# Patient Record
Sex: Male | Born: 1955 | Race: White | Hispanic: No | Marital: Married | Smoking: Former smoker
Health system: Southern US, Community
[De-identification: ages and names within clinical notes are randomized; demographics above are authoritative.]

## PROBLEM LIST (undated history)

## (undated) ENCOUNTER — Ambulatory Visit

## (undated) DIAGNOSIS — E119 Type 2 diabetes mellitus without complications: Secondary | ICD-10-CM

## (undated) DIAGNOSIS — J449 Chronic obstructive pulmonary disease, unspecified: Secondary | ICD-10-CM

## (undated) DIAGNOSIS — F419 Anxiety disorder, unspecified: Secondary | ICD-10-CM

## (undated) DIAGNOSIS — R7303 Prediabetes: Secondary | ICD-10-CM

## (undated) DIAGNOSIS — M109 Gout, unspecified: Secondary | ICD-10-CM

## (undated) DIAGNOSIS — Z9889 Other specified postprocedural states: Secondary | ICD-10-CM

## (undated) DIAGNOSIS — R112 Nausea with vomiting, unspecified: Secondary | ICD-10-CM

## (undated) DIAGNOSIS — F32A Depression, unspecified: Secondary | ICD-10-CM

## (undated) DIAGNOSIS — R51 Headache: Secondary | ICD-10-CM

## (undated) DIAGNOSIS — T4145XA Adverse effect of unspecified anesthetic, initial encounter: Secondary | ICD-10-CM

## (undated) DIAGNOSIS — K219 Gastro-esophageal reflux disease without esophagitis: Secondary | ICD-10-CM

## (undated) DIAGNOSIS — N400 Enlarged prostate without lower urinary tract symptoms: Secondary | ICD-10-CM

## (undated) DIAGNOSIS — M1990 Unspecified osteoarthritis, unspecified site: Secondary | ICD-10-CM

## (undated) DIAGNOSIS — G4730 Sleep apnea, unspecified: Secondary | ICD-10-CM

## (undated) DIAGNOSIS — R011 Cardiac murmur, unspecified: Secondary | ICD-10-CM

## (undated) DIAGNOSIS — F329 Major depressive disorder, single episode, unspecified: Secondary | ICD-10-CM

## (undated) DIAGNOSIS — Z972 Presence of dental prosthetic device (complete) (partial): Secondary | ICD-10-CM

## (undated) DIAGNOSIS — Z8719 Personal history of other diseases of the digestive system: Secondary | ICD-10-CM

## (undated) DIAGNOSIS — T8859XA Other complications of anesthesia, initial encounter: Secondary | ICD-10-CM

## (undated) DIAGNOSIS — K589 Irritable bowel syndrome without diarrhea: Secondary | ICD-10-CM

## (undated) DIAGNOSIS — J45909 Unspecified asthma, uncomplicated: Secondary | ICD-10-CM

## (undated) DIAGNOSIS — E785 Hyperlipidemia, unspecified: Secondary | ICD-10-CM

## (undated) HISTORY — PX: KNEE ARTHROSCOPY: SUR90

## (undated) HISTORY — PX: SHOULDER SURGERY: SHX246

## (undated) HISTORY — PX: APPENDECTOMY: SHX54

## (undated) HISTORY — PX: CHOLECYSTECTOMY: SHX55

## (undated) HISTORY — PX: NECK SURGERY: SHX720

## (undated) HISTORY — PX: SPINAL FUSION: SHX223

## (undated) HISTORY — DX: Hyperlipidemia, unspecified: E78.5

---

## 1996-09-01 HISTORY — PX: HERNIA REPAIR: SHX51

## 1999-08-27 ENCOUNTER — Ambulatory Visit (HOSPITAL_COMMUNITY): Admission: RE | Admit: 1999-08-27 | Discharge: 1999-08-27 | Payer: Self-pay | Admitting: Orthopedic Surgery

## 1999-08-27 ENCOUNTER — Encounter: Payer: Self-pay | Admitting: Orthopedic Surgery

## 1999-12-17 ENCOUNTER — Ambulatory Visit (HOSPITAL_COMMUNITY): Admission: RE | Admit: 1999-12-17 | Discharge: 1999-12-17 | Payer: Self-pay | Admitting: Neurosurgery

## 1999-12-17 ENCOUNTER — Encounter: Payer: Self-pay | Admitting: Neurosurgery

## 1999-12-23 ENCOUNTER — Encounter: Payer: Self-pay | Admitting: Neurosurgery

## 1999-12-24 ENCOUNTER — Encounter: Payer: Self-pay | Admitting: Neurosurgery

## 1999-12-24 ENCOUNTER — Inpatient Hospital Stay (HOSPITAL_COMMUNITY): Admission: RE | Admit: 1999-12-24 | Discharge: 1999-12-28 | Payer: Self-pay | Admitting: Neurosurgery

## 1999-12-27 ENCOUNTER — Encounter: Payer: Self-pay | Admitting: Neurosurgery

## 2000-01-06 ENCOUNTER — Encounter: Payer: Self-pay | Admitting: Neurosurgery

## 2000-01-06 ENCOUNTER — Encounter: Admission: RE | Admit: 2000-01-06 | Discharge: 2000-01-06 | Payer: Self-pay | Admitting: Neurosurgery

## 2000-02-10 ENCOUNTER — Encounter: Admission: RE | Admit: 2000-02-10 | Discharge: 2000-02-10 | Payer: Self-pay | Admitting: Neurosurgery

## 2000-02-10 ENCOUNTER — Encounter: Payer: Self-pay | Admitting: Neurosurgery

## 2000-03-26 ENCOUNTER — Encounter: Admission: RE | Admit: 2000-03-26 | Discharge: 2000-03-26 | Payer: Self-pay | Admitting: Neurosurgery

## 2000-03-26 ENCOUNTER — Encounter: Payer: Self-pay | Admitting: Neurosurgery

## 2000-06-08 ENCOUNTER — Encounter: Admission: RE | Admit: 2000-06-08 | Discharge: 2000-06-08 | Payer: Self-pay | Admitting: Neurosurgery

## 2000-06-08 ENCOUNTER — Encounter: Payer: Self-pay | Admitting: Neurosurgery

## 2000-09-01 HISTORY — PX: BACK SURGERY: SHX140

## 2000-09-10 ENCOUNTER — Encounter: Admission: RE | Admit: 2000-09-10 | Discharge: 2000-09-10 | Payer: Self-pay | Admitting: Neurosurgery

## 2000-09-10 ENCOUNTER — Encounter: Payer: Self-pay | Admitting: Neurosurgery

## 2000-11-10 ENCOUNTER — Encounter: Payer: Self-pay | Admitting: Neurosurgery

## 2000-11-10 ENCOUNTER — Ambulatory Visit (HOSPITAL_COMMUNITY): Admission: RE | Admit: 2000-11-10 | Discharge: 2000-11-10 | Payer: Self-pay | Admitting: Neurosurgery

## 2000-12-08 ENCOUNTER — Encounter (HOSPITAL_COMMUNITY): Admission: RE | Admit: 2000-12-08 | Discharge: 2001-01-07 | Payer: Self-pay | Admitting: Neurosurgery

## 2000-12-22 ENCOUNTER — Encounter: Payer: Self-pay | Admitting: Neurosurgery

## 2000-12-22 ENCOUNTER — Inpatient Hospital Stay (HOSPITAL_COMMUNITY): Admission: RE | Admit: 2000-12-22 | Discharge: 2000-12-23 | Payer: Self-pay | Admitting: Neurosurgery

## 2000-12-29 ENCOUNTER — Ambulatory Visit (HOSPITAL_COMMUNITY): Admission: RE | Admit: 2000-12-29 | Discharge: 2000-12-29 | Payer: Self-pay | Admitting: Neurosurgery

## 2000-12-29 ENCOUNTER — Encounter: Payer: Self-pay | Admitting: Neurosurgery

## 2001-01-06 ENCOUNTER — Encounter: Payer: Self-pay | Admitting: Neurosurgery

## 2001-01-06 ENCOUNTER — Ambulatory Visit (HOSPITAL_COMMUNITY): Admission: RE | Admit: 2001-01-06 | Discharge: 2001-01-08 | Payer: Self-pay | Admitting: Neurosurgery

## 2001-01-06 ENCOUNTER — Encounter: Admission: RE | Admit: 2001-01-06 | Discharge: 2001-01-06 | Payer: Self-pay | Admitting: Neurosurgery

## 2001-02-01 ENCOUNTER — Encounter (HOSPITAL_COMMUNITY): Admission: RE | Admit: 2001-02-01 | Discharge: 2001-03-03 | Payer: Self-pay | Admitting: Neurosurgery

## 2001-02-10 ENCOUNTER — Encounter (HOSPITAL_COMMUNITY): Admission: RE | Admit: 2001-02-10 | Discharge: 2001-03-12 | Payer: Self-pay | Admitting: Orthopedic Surgery

## 2001-02-25 ENCOUNTER — Encounter: Admission: RE | Admit: 2001-02-25 | Discharge: 2001-02-25 | Payer: Self-pay | Admitting: Neurosurgery

## 2001-02-25 ENCOUNTER — Encounter: Payer: Self-pay | Admitting: Neurosurgery

## 2001-06-04 ENCOUNTER — Encounter: Admission: RE | Admit: 2001-06-04 | Discharge: 2001-06-04 | Payer: Self-pay | Admitting: Neurosurgery

## 2001-06-04 ENCOUNTER — Encounter: Payer: Self-pay | Admitting: Neurosurgery

## 2001-06-07 ENCOUNTER — Ambulatory Visit (HOSPITAL_COMMUNITY): Admission: RE | Admit: 2001-06-07 | Discharge: 2001-06-07 | Payer: Self-pay | Admitting: Pulmonary Disease

## 2001-06-18 ENCOUNTER — Ambulatory Visit (HOSPITAL_COMMUNITY): Admission: RE | Admit: 2001-06-18 | Discharge: 2001-06-18 | Payer: Self-pay | Admitting: Pulmonary Disease

## 2001-07-01 ENCOUNTER — Ambulatory Visit (HOSPITAL_COMMUNITY): Admission: RE | Admit: 2001-07-01 | Discharge: 2001-07-01 | Payer: Self-pay | Admitting: Pulmonary Disease

## 2001-07-07 ENCOUNTER — Observation Stay (HOSPITAL_COMMUNITY): Admission: RE | Admit: 2001-07-07 | Discharge: 2001-07-08 | Payer: Self-pay | Admitting: General Surgery

## 2001-10-27 ENCOUNTER — Other Ambulatory Visit: Admission: RE | Admit: 2001-10-27 | Discharge: 2001-10-27 | Payer: Self-pay | Admitting: Otolaryngology

## 2001-11-15 ENCOUNTER — Ambulatory Visit (HOSPITAL_COMMUNITY): Admission: RE | Admit: 2001-11-15 | Discharge: 2001-11-15 | Payer: Self-pay | Admitting: Pulmonary Disease

## 2001-12-02 ENCOUNTER — Emergency Department (HOSPITAL_COMMUNITY): Admission: EM | Admit: 2001-12-02 | Discharge: 2001-12-02 | Payer: Self-pay | Admitting: *Deleted

## 2001-12-02 ENCOUNTER — Encounter: Payer: Self-pay | Admitting: Emergency Medicine

## 2001-12-25 ENCOUNTER — Emergency Department (HOSPITAL_COMMUNITY): Admission: EM | Admit: 2001-12-25 | Discharge: 2001-12-25 | Payer: Self-pay | Admitting: Internal Medicine

## 2001-12-25 ENCOUNTER — Encounter: Payer: Self-pay | Admitting: Internal Medicine

## 2002-01-06 ENCOUNTER — Ambulatory Visit (HOSPITAL_COMMUNITY): Admission: RE | Admit: 2002-01-06 | Discharge: 2002-01-06 | Payer: Self-pay | Admitting: Pulmonary Disease

## 2002-01-10 ENCOUNTER — Ambulatory Visit (HOSPITAL_COMMUNITY): Admission: RE | Admit: 2002-01-10 | Discharge: 2002-01-10 | Payer: Self-pay | Admitting: Pulmonary Disease

## 2002-08-22 ENCOUNTER — Emergency Department (HOSPITAL_COMMUNITY): Admission: EM | Admit: 2002-08-22 | Discharge: 2002-08-22 | Payer: Self-pay | Admitting: Emergency Medicine

## 2002-08-22 ENCOUNTER — Ambulatory Visit (HOSPITAL_COMMUNITY): Admission: RE | Admit: 2002-08-22 | Discharge: 2002-08-22 | Payer: Self-pay | Admitting: Pulmonary Disease

## 2002-09-09 ENCOUNTER — Encounter: Payer: Self-pay | Admitting: *Deleted

## 2002-09-09 ENCOUNTER — Emergency Department (HOSPITAL_COMMUNITY): Admission: EM | Admit: 2002-09-09 | Discharge: 2002-09-09 | Payer: Self-pay | Admitting: *Deleted

## 2002-09-29 ENCOUNTER — Ambulatory Visit (HOSPITAL_COMMUNITY): Admission: RE | Admit: 2002-09-29 | Discharge: 2002-09-29 | Payer: Self-pay | Admitting: Pulmonary Disease

## 2002-11-14 ENCOUNTER — Encounter (HOSPITAL_COMMUNITY): Admission: RE | Admit: 2002-11-14 | Discharge: 2002-12-14 | Payer: Self-pay | Admitting: Family Medicine

## 2003-01-10 ENCOUNTER — Ambulatory Visit (HOSPITAL_BASED_OUTPATIENT_CLINIC_OR_DEPARTMENT_OTHER): Admission: RE | Admit: 2003-01-10 | Discharge: 2003-01-10 | Payer: Self-pay | Admitting: Orthopedic Surgery

## 2003-01-10 ENCOUNTER — Encounter: Payer: Self-pay | Admitting: Orthopedic Surgery

## 2003-02-16 ENCOUNTER — Encounter (HOSPITAL_COMMUNITY): Admission: RE | Admit: 2003-02-16 | Discharge: 2003-03-18 | Payer: Self-pay | Admitting: Orthopedic Surgery

## 2003-02-24 ENCOUNTER — Observation Stay (HOSPITAL_COMMUNITY): Admission: RE | Admit: 2003-02-24 | Discharge: 2003-02-25 | Payer: Self-pay | Admitting: General Surgery

## 2003-09-02 HISTORY — PX: OTHER SURGICAL HISTORY: SHX169

## 2004-07-11 ENCOUNTER — Ambulatory Visit: Payer: Self-pay | Admitting: Urgent Care

## 2004-07-16 ENCOUNTER — Ambulatory Visit: Payer: Self-pay | Admitting: Internal Medicine

## 2004-07-18 ENCOUNTER — Ambulatory Visit (HOSPITAL_COMMUNITY): Admission: RE | Admit: 2004-07-18 | Discharge: 2004-07-18 | Payer: Self-pay | Admitting: Internal Medicine

## 2004-07-26 ENCOUNTER — Ambulatory Visit (HOSPITAL_COMMUNITY): Admission: RE | Admit: 2004-07-26 | Discharge: 2004-07-26 | Payer: Self-pay | Admitting: Internal Medicine

## 2004-08-29 ENCOUNTER — Ambulatory Visit: Payer: Self-pay | Admitting: Internal Medicine

## 2004-10-03 ENCOUNTER — Emergency Department (HOSPITAL_COMMUNITY): Admission: EM | Admit: 2004-10-03 | Discharge: 2004-10-03 | Payer: Self-pay | Admitting: Emergency Medicine

## 2004-11-24 ENCOUNTER — Encounter: Payer: Self-pay | Admitting: Orthopedic Surgery

## 2005-07-15 ENCOUNTER — Ambulatory Visit: Payer: Self-pay | Admitting: *Deleted

## 2005-07-15 ENCOUNTER — Ambulatory Visit (HOSPITAL_COMMUNITY): Admission: RE | Admit: 2005-07-15 | Discharge: 2005-07-15 | Payer: Self-pay | Admitting: Cardiology

## 2005-07-21 ENCOUNTER — Ambulatory Visit: Payer: Self-pay | Admitting: *Deleted

## 2005-07-21 ENCOUNTER — Inpatient Hospital Stay (HOSPITAL_BASED_OUTPATIENT_CLINIC_OR_DEPARTMENT_OTHER): Admission: RE | Admit: 2005-07-21 | Discharge: 2005-07-21 | Payer: Self-pay | Admitting: *Deleted

## 2005-08-04 ENCOUNTER — Ambulatory Visit: Payer: Self-pay | Admitting: Cardiology

## 2005-08-06 ENCOUNTER — Ambulatory Visit: Payer: Self-pay | Admitting: Cardiology

## 2005-08-06 ENCOUNTER — Ambulatory Visit (HOSPITAL_COMMUNITY): Admission: RE | Admit: 2005-08-06 | Discharge: 2005-08-06 | Payer: Self-pay | Admitting: *Deleted

## 2005-08-29 ENCOUNTER — Ambulatory Visit: Admission: RE | Admit: 2005-08-29 | Discharge: 2005-08-29 | Payer: Self-pay | Admitting: Pulmonary Disease

## 2005-09-05 ENCOUNTER — Ambulatory Visit: Payer: Self-pay | Admitting: Pulmonary Disease

## 2005-11-24 ENCOUNTER — Ambulatory Visit: Payer: Self-pay | Admitting: Orthopedic Surgery

## 2006-07-01 ENCOUNTER — Ambulatory Visit: Payer: Self-pay | Admitting: Orthopedic Surgery

## 2006-10-07 ENCOUNTER — Ambulatory Visit: Payer: Self-pay | Admitting: Orthopedic Surgery

## 2006-10-26 ENCOUNTER — Ambulatory Visit: Payer: Self-pay | Admitting: Orthopedic Surgery

## 2006-12-07 ENCOUNTER — Ambulatory Visit: Payer: Self-pay | Admitting: Orthopedic Surgery

## 2007-11-08 ENCOUNTER — Ambulatory Visit: Payer: Self-pay | Admitting: Orthopedic Surgery

## 2007-11-08 DIAGNOSIS — S93409A Sprain of unspecified ligament of unspecified ankle, initial encounter: Secondary | ICD-10-CM | POA: Insufficient documentation

## 2008-03-17 ENCOUNTER — Emergency Department (HOSPITAL_COMMUNITY): Admission: EM | Admit: 2008-03-17 | Discharge: 2008-03-17 | Payer: Self-pay | Admitting: Emergency Medicine

## 2008-03-18 ENCOUNTER — Inpatient Hospital Stay (HOSPITAL_COMMUNITY): Admission: AD | Admit: 2008-03-18 | Discharge: 2008-03-21 | Payer: Self-pay | Admitting: Psychiatry

## 2008-03-18 ENCOUNTER — Ambulatory Visit: Payer: Self-pay | Admitting: Psychiatry

## 2008-12-13 ENCOUNTER — Ambulatory Visit (HOSPITAL_COMMUNITY): Admission: RE | Admit: 2008-12-13 | Discharge: 2008-12-13 | Payer: Self-pay | Admitting: Pulmonary Disease

## 2009-01-30 ENCOUNTER — Ambulatory Visit: Payer: Self-pay | Admitting: Orthopedic Surgery

## 2009-01-30 DIAGNOSIS — M25569 Pain in unspecified knee: Secondary | ICD-10-CM | POA: Insufficient documentation

## 2009-01-30 DIAGNOSIS — M1710 Unilateral primary osteoarthritis, unspecified knee: Secondary | ICD-10-CM

## 2009-07-09 ENCOUNTER — Ambulatory Visit: Payer: Self-pay | Admitting: Orthopedic Surgery

## 2009-07-09 DIAGNOSIS — M23302 Other meniscus derangements, unspecified lateral meniscus, unspecified knee: Secondary | ICD-10-CM | POA: Insufficient documentation

## 2009-07-09 DIAGNOSIS — M25469 Effusion, unspecified knee: Secondary | ICD-10-CM

## 2009-07-23 ENCOUNTER — Ambulatory Visit: Payer: Self-pay | Admitting: Orthopedic Surgery

## 2009-07-31 ENCOUNTER — Encounter (INDEPENDENT_AMBULATORY_CARE_PROVIDER_SITE_OTHER): Payer: Self-pay | Admitting: *Deleted

## 2009-08-07 ENCOUNTER — Ambulatory Visit: Payer: Self-pay | Admitting: Orthopedic Surgery

## 2009-08-07 ENCOUNTER — Ambulatory Visit (HOSPITAL_COMMUNITY): Admission: RE | Admit: 2009-08-07 | Discharge: 2009-08-07 | Payer: Self-pay | Admitting: Orthopedic Surgery

## 2009-08-09 ENCOUNTER — Ambulatory Visit: Payer: Self-pay | Admitting: Orthopedic Surgery

## 2009-08-09 DIAGNOSIS — M6750 Plica syndrome, unspecified knee: Secondary | ICD-10-CM | POA: Insufficient documentation

## 2009-08-28 ENCOUNTER — Ambulatory Visit: Payer: Self-pay | Admitting: Orthopedic Surgery

## 2009-09-18 ENCOUNTER — Ambulatory Visit: Payer: Self-pay | Admitting: Orthopedic Surgery

## 2009-10-19 ENCOUNTER — Encounter: Payer: Self-pay | Admitting: Orthopedic Surgery

## 2010-01-15 ENCOUNTER — Ambulatory Visit: Payer: Self-pay | Admitting: Orthopedic Surgery

## 2010-01-15 DIAGNOSIS — M25559 Pain in unspecified hip: Secondary | ICD-10-CM

## 2010-01-15 DIAGNOSIS — M5126 Other intervertebral disc displacement, lumbar region: Secondary | ICD-10-CM

## 2010-01-16 ENCOUNTER — Telehealth: Payer: Self-pay | Admitting: Orthopedic Surgery

## 2010-01-17 ENCOUNTER — Telehealth: Payer: Self-pay | Admitting: Orthopedic Surgery

## 2010-01-18 ENCOUNTER — Encounter: Payer: Self-pay | Admitting: Orthopedic Surgery

## 2010-02-02 ENCOUNTER — Encounter: Admission: RE | Admit: 2010-02-02 | Discharge: 2010-02-02 | Payer: Self-pay | Admitting: Neurosurgery

## 2010-02-11 ENCOUNTER — Encounter: Payer: Self-pay | Admitting: Orthopedic Surgery

## 2010-02-19 ENCOUNTER — Inpatient Hospital Stay (HOSPITAL_COMMUNITY): Admission: RE | Admit: 2010-02-19 | Discharge: 2010-02-23 | Payer: Self-pay | Admitting: Neurosurgery

## 2010-03-14 ENCOUNTER — Encounter: Payer: Self-pay | Admitting: Orthopedic Surgery

## 2010-04-10 ENCOUNTER — Encounter: Payer: Self-pay | Admitting: Orthopedic Surgery

## 2010-09-24 ENCOUNTER — Encounter: Payer: Self-pay | Admitting: Orthopedic Surgery

## 2010-09-24 ENCOUNTER — Ambulatory Visit
Admission: RE | Admit: 2010-09-24 | Discharge: 2010-09-24 | Payer: Self-pay | Source: Home / Self Care | Attending: Orthopedic Surgery | Admitting: Orthopedic Surgery

## 2010-10-01 ENCOUNTER — Ambulatory Visit: Admit: 2010-10-01 | Payer: Self-pay | Admitting: Orthopedic Surgery

## 2010-10-01 NOTE — Assessment & Plan Note (Signed)
Summary: 3 mo reck xr left knee/sec hor/bsf   Visit Type:  Follow-up Primary Provider:  Dr. Juanetta Gosling  CC:  recheck left knee.  History of Present Illness: I saw Christopher Christopher in the office today for a followup visit.  He is a 56 years old man with the complaint of:  left knee fu surgery 7 arthroscopy, LEFT knee for partial medial meniscectomy, doing well,  Now comes in with new complaint of back pain and LEFT leg radicular pain.  Has back pain and pain radiating down the left leg with constant throbbing pain  in the left thigh and left calf area.  Knee doing well not bothering him   flexeril  sleeping pill     Allergies: 1)  ! Codeine 2)  ! Prednisone 3)  ! Percocet 4)  ! Doxycycline 5)  ! * Cogesic 6)  ! 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 Musculoskeletal:  Complains of muscle pain.  Physical Exam  Additional Exam:  left hip tender in the gluteal region;  relexes are the same 0-1+  no muscle atrophy   ROM of the hip is normal    Impression & Recommendations:  Problem # 1:  KNEE, ARTHRITIS, DEGEN./OSTEO (ICD-715.96) Assessment Improved  xrays of the pelvis show normal hip joints   L spine looks like L5 - S1 fusion ? L4-5 listhesis  fusion looks good      Problem # 2:  HIP PAIN (ICD-719.45) Assessment: Comment Only     Orders: Est. Patient Level III (82956)  Problem # 3:  DEGENERATIVE DISC DISEASE, LUMBOSACRAL SPINE  W/RADICULOPATHY (ICD-722.10) Assessment: New  neuro referral   Orders: Est. Patient Level III (21308)  Medications Added to Medication List This Visit: 1)  Neurontin 100 Mg Caps (Gabapentin) .Marland Kitchen.. 1-2 by mouth three times a day  Patient Instructions: 1)  Call Dr Jeral Fruit for referral s/p Lumbar fusion by him with recurrent left leg radiculopathy  2)  start neurontin 2 every 8 hours  Prescriptions: NEURONTIN 100 MG CAPS (GABAPENTIN) 1-2 by mouth three times a day  #60 x 2   Entered and Authorized by:   Fuller Canada MD   Signed by:   Fuller Canada MD on 01/15/2010   Method used:   Print then Give to Patient   RxID:   [id number]

## 2010-10-01 NOTE — Letter (Signed)
Summary: [facility] medical record request  [facility] medical record request   Imported By: Jacklynn Ganong 08/01/2010 11:39:52  _____________________________________________________________________  External Attachment:    Type:   Image     Comment:   External Document

## 2010-10-01 NOTE — Progress Notes (Signed)
Summary: Neurosurgeon referral.  Phone Note Outgoing Call   Call placed by: Waldon Reining,  Jan 16, 2010 3:13 PM Call placed to: Specialist Action Taken: Information Sent Summary of Call: I faxed a referral for this patient to [facility] to see Dr. Jeral Fruit fo rback and leg pain to be seen asap.

## 2010-10-01 NOTE — Miscellaneous (Signed)
Summary: patient came by our office  Clinical Lists Changes   Patient came by our office said needs to talk to you if you will call him at home, also needs refill on Tylox, thanks.

## 2010-10-01 NOTE — Letter (Signed)
Summary: [facility] office note Dr Anise Salvo office note Dr Jeral Fruit   Imported By: Cammie Sickle 04/10/2010 18:34:52  _____________________________________________________________________  External Attachment:    Type:   Image     Comment:   External Document

## 2010-10-01 NOTE — Letter (Signed)
Summary: [facility] office note Dr Anise Salvo office note Dr Jeral Fruit   Imported By: Cammie Sickle 02/23/2010 12:48:07  _____________________________________________________________________  External Attachment:    Type:   Image     Comment:   External Document

## 2010-10-01 NOTE — Assessment & Plan Note (Signed)
Summary: 3 WK RE-CK/POST OP LT KNEE SURG 08/07/09;SEC HORIZ/CAF   Primary Provider:  Dr. Juanetta Gosling   History of Present Illness: I saw Christopher [name] in the office today for a 3 week  followup visit.  He is a 55 years old man with the complaint of:  POST OP LEFT KNEE.  DOS 08/07/09 left knee  Procedure Arthroscopy left knee. Extensive synovectomy, and partial medial meniscectomy   Pain is lateral.  Medication  tylox/generic  Subjectives  recheck after home exercises.  Patient states his knee is sore, sometimes he has pressure at the joint.  Meds: FLexeril, Seroquel, Naproxen two times a day, Zoloft, Xanax, Protonix, Temazepam.  The knee looks great. Today. There's no swelling. He is stable. There is tenderness over the medial femoral condyle. McMurray sign is negative.  Assessment improved, but has degenerative disease.  Recommend bracing for activity, Tylenol arthritis and pain medication as needed  Allergies: 1)  ! Codeine 2)  ! Prednisone 3)  ! Percocet 4)  ! Doxycycline 5)  ! * Cogesic 6)  ! * Bee Stings   Impression & Recommendations:  Problem # 1:  DERANGEMENT MENISCUS (ICD-717.5) Assessment Comment Only  Orders: Post-Op Check ([address])  Problem # 2:  PLICA SYNDROME (ICD-727.83) Assessment: Improved  Orders: Post-Op Check (60454)  Patient Instructions: 1)  gradual return to normal activity  2)  wear the barce for activity  3)  take meds as needed  4)  return in 3 months xrays left knee

## 2010-10-01 NOTE — Consult Note (Signed)
Summary: [facility] consult Dr Anise Salvo consult Dr Jeral Fruit   Imported By: Cammie Sickle 02/04/2010 12:08:53  _____________________________________________________________________  External Attachment:    Type:   Image     Comment:   External Document

## 2010-10-01 NOTE — Progress Notes (Signed)
Summary: Appointment with Dr. Jeral Fruit.  Phone Note From Other Clinic   Caller: Referral Coordinator Reason for Call: Schedule Patient Appt Summary of Call: Patient has an appointment at [facility] with Dr. Jeral Fruit on 01-18-10. Initial call taken by: Waldon Reining,  Jan 17, 2010 3:09 PM

## 2010-10-03 NOTE — Letter (Signed)
Summary: Generic Letter  [facility]  [address] [place] [address]  [place], [place] [address]   Phone: [phone]  Fax: [phone]    09/24/2010  LOTTIE SISKA [address] [place], [place]  [address]  Dear Mr. WITHROW,  You have received an injection of cortisone today. You may experience increased pain at the injection site. Apply ice pack to the area for 20 minutes every 2 hours and take 2 xtra strength tylenol every 8 hours. This increased pain will usually resolve in 24 hours. The injection will take effect in 3-10 days.   Return 1 week for left knee evaluation    Sincerely,   Fuller Canada MD

## 2010-10-03 NOTE — Miscellaneous (Signed)
  Chief complaint RIGHT knee pain.  Status post arthroscopy, RIGHT knee several years ago complains of RIGHT knee pain over the anterior part of his knee around the patella area. Denies any injury reports swelling and fluid at the end of the day. He feels some tingling in his shin radiating from his knee. He says he feels pressure with flexing the knee.  No catching, locking, or giving way.  Exam shows no fluid on the knee at this time. He is tender around the inferior pole of patella medially and laterally. Patella tendon, nontender. Quadriceps tendon, nontender. No joint effusion is seen.  Negative McMurray sign.  Her ACL is intact.  He has full range of motion.  Neurovascular exam is normal.  Impression: he probably has some arthritis on seen on x-ray.  Recommend injection. Verbal consent was obtained. The RIGHT knee was prepped with alcohol and ethyl chloride. 1 cc of depomedrol 40mg /cc and 4 cc of lidocaine 1% was injected. there were no complications.   Separate identifiable. X-ray report 3 views RIGHT knee mild medial joint space narrowing, patellofemoral joint looks good.Impression mild medial joint space narrowing, consistent with osteoarthritis  I will see him in a week to check his LEFT knee

## 2010-10-03 NOTE — Assessment & Plan Note (Signed)
Summary: FLUID ON RT KNEE/[employer]/BSF   Allergies: 1)  ! Codeine 2)  ! Prednisone 3)  ! Percocet 4)  ! Doxycycline 5)  ! * Cogesic 6)  ! * Bee Stings

## 2010-10-09 NOTE — Miscellaneous (Signed)
  Clinical Lists Changes  Orders: Added new Service order of Est. Patient Level III (32440) - Signed Added new Service order of Knee x-ray,  3 views (10272) - Signed Added new Service order of Joint Aspirate / Injection, Large (20610) - Signed Added new Service order of Depo- Medrol 40mg  (J1030) - Signed

## 2010-10-15 ENCOUNTER — Ambulatory Visit (INDEPENDENT_AMBULATORY_CARE_PROVIDER_SITE_OTHER): Payer: MEDICARE | Admitting: Orthopedic Surgery

## 2010-10-15 ENCOUNTER — Encounter: Payer: Self-pay | Admitting: Orthopedic Surgery

## 2010-10-15 DIAGNOSIS — IMO0002 Reserved for concepts with insufficient information to code with codable children: Secondary | ICD-10-CM

## 2010-10-15 DIAGNOSIS — M1710 Unilateral primary osteoarthritis, unspecified knee: Secondary | ICD-10-CM | POA: Insufficient documentation

## 2010-10-18 ENCOUNTER — Telehealth: Payer: Self-pay | Admitting: Orthopedic Surgery

## 2010-10-18 ENCOUNTER — Other Ambulatory Visit: Payer: Self-pay | Admitting: Orthopedic Surgery

## 2010-10-18 DIAGNOSIS — S83209A Unspecified tear of unspecified meniscus, current injury, unspecified knee, initial encounter: Secondary | ICD-10-CM

## 2010-10-21 ENCOUNTER — Ambulatory Visit (HOSPITAL_COMMUNITY)
Admission: RE | Admit: 2010-10-21 | Discharge: 2010-10-21 | Disposition: A | Discharge status: Home / Self Care | Payer: MEDICARE | Source: Ambulatory Visit | Attending: Orthopedic Surgery | Admitting: Orthopedic Surgery

## 2010-10-21 DIAGNOSIS — M25469 Effusion, unspecified knee: Secondary | ICD-10-CM | POA: Insufficient documentation

## 2010-10-21 DIAGNOSIS — M25569 Pain in unspecified knee: Secondary | ICD-10-CM | POA: Insufficient documentation

## 2010-10-21 DIAGNOSIS — S83209A Unspecified tear of unspecified meniscus, current injury, unspecified knee, initial encounter: Secondary | ICD-10-CM

## 2010-10-23 NOTE — Assessment & Plan Note (Signed)
Summary: Eval LT knee (had RT knee eval 09/24/10)UHC/caf / pt r/s from ...   Visit Type:  Follow-up Primary Provider:  Dr. Juanetta Gosling  CC:  left knee pain.  History of Present Illness: I saw Christopher [name] in the office today for a followup visit.  He is a 55 years old man with the complaint of:  bilateral knees.  DOS   08-07-09.  Procedure   Arthroscopy of left knee.  Last seen on 09-24-10 and received an injection, did not help.  Complaints: Right knee is not any better. Left knee has soreness.  medial knee pain and tenderness, MCMurrays +   Allergies: 1)  ! Codeine 2)  ! Prednisone 3)  ! Percocet 4)  ! Doxycycline 5)  ! * Cogesic 6)  ! * Bee Stings  Past History:  Past Medical History: Last updated: 01/30/2009 medical history includes hypertension  Past Surgical History: Last updated: 01/30/2009 previous surgery on his lumbar spine and his RIGHT knee, arthroscopy  Review of Systems Neurologic:  right leg loss of sensation .   Knee Exam  General:    Well-developed, well-nourished, normal body habitus; no deformities, normal grooming.  Gait:    Normal heel-toe gait pattern bilaterally.    Skin:    Intact, no scars, lesions, rashes, cafe au lait spots, or bruising.    Palpation:    tenderness R-medial joint line and tenderness L-medial joint line.    Vascular:    There was no swelling or varicose veins. The pulses and temperature are normal. There was no edema or tenderness.  Sensory:    Gross coordination and sensation were normal.    Motor:    Motor strength 5/5 bilaterally for quadriceps, hamstrings, ankle dorsiflexion, and ankle plantar flexion.    Knee Exam:    Right:    Stability:  stable    Inspection/Palpation:  medial knee pain and tenderness, MCMurrays +     Range of Motion:       Flexion-Active: 125 degrees    Left:    Inspection:  Abnormal    Palpation:  Abnormal    Stability:  stable    Range of Motion:       Flexion-Active: 125  degrees   Impression & Recommendations:  Problem # 1:  ARTHRITIS, LEFT KNEE (ZOX-096.04) Assessment Deteriorated  Verbal consent was obtained. The left knee was prepped with alcohol and ethyl chloride. 1 cc of depomedrol 40mg /cc and 4 cc of lidocaine 1% was injected. there were no complications.  Orders: Est. Patient Level III (54098) Joint Aspirate / Injection, Large (20610) Depo- Medrol 40mg  (J1030)  Problem # 2:  MEDIAL MENISCUS TEAR, RIGHT (ICD-836.0) Assessment: New  MRI right knee    previous xrays show medial joint spcae narrowing   Orders: Est. Patient Level III (11914)  Patient Instructions: 1)  MRI right knee  2)  return for results    Orders Added: 1)  Est. Patient Level III [78295] 2)  Joint Aspirate / Injection, Large [20610] 3)  Depo- Medrol 40mg  [J1030]

## 2010-10-23 NOTE — Progress Notes (Signed)
Summary: MRI appointment.  Phone Note Outgoing Call   Call placed by: Waldon Reining,  October 18, 2010 10:28 AM Call placed to: Patient Action Taken: Appt scheduled Summary of Call: I called to give the patient his MRI appointment at [facility] on 10-21-10 at 12:30. Patient has [employer], authorization [phone]. Patient will follow up here for results.

## 2010-11-15 ENCOUNTER — Encounter: Payer: Self-pay | Admitting: Orthopedic Surgery

## 2010-11-17 LAB — CBC
HCT: 43 % (ref 39.0–52.0)
Hemoglobin: 14.8 g/dL (ref 13.0–17.0)
MCHC: 34.4 g/dL (ref 30.0–36.0)
MCV: 87.4 fL (ref 78.0–100.0)
Platelets: 211 10*3/uL (ref 150–400)
RBC: 4.92 MIL/uL (ref 4.22–5.81)
RDW: 14 % (ref 11.5–15.5)
WBC: 8.5 10*3/uL (ref 4.0–10.5)

## 2010-11-17 LAB — BASIC METABOLIC PANEL
BUN: 15 mg/dL (ref 6–23)
CO2: 29 mEq/L (ref 19–32)
Calcium: 9.7 mg/dL (ref 8.4–10.5)
Chloride: 103 mEq/L (ref 96–112)
Creatinine, Ser: 0.98 mg/dL (ref 0.4–1.5)
GFR calc Af Amer: >60 mL/min (ref >60)
GFR calc non Af Amer: >60 mL/min (ref >60)
Glucose, Bld: 96 mg/dL (ref 70–99)
Potassium: 4.9 mEq/L (ref 3.5–5.1)
Sodium: 139 mEq/L (ref 135–145)

## 2010-11-17 LAB — SURGICAL PCR SCREEN
MRSA, PCR: POSITIVE — AB
Staphylococcus aureus: POSITIVE — AB

## 2010-11-17 LAB — TYPE AND SCREEN
ABO/RH(D): B POS
Antibody Screen: NEGATIVE

## 2010-11-17 LAB — ABO/RH: ABO/RH(D): B POS

## 2010-11-21 ENCOUNTER — Telehealth: Payer: Self-pay | Admitting: Orthopedic Surgery

## 2010-11-21 NOTE — Telephone Encounter (Signed)
i dont have the report

## 2010-11-21 NOTE — Telephone Encounter (Signed)
Patient requests review and call re: MRI RT knee, ph [phone].  Last off note "fol/up here for results" - patient wishes to (if possible) receive results by phone due to high insurance copay for office visit.

## 2010-11-21 NOTE — Telephone Encounter (Signed)
Patient and spouse brought MRI film RT knee from DOS 10/21/10.  Request call back w/results Vs. Fol/up appointment in office as last office note indicates, if possible, due to cost of office visit copay.  Film on cart.  Please advise. Patient PH # [phone]

## 2010-11-25 NOTE — Telephone Encounter (Signed)
Please review

## 2010-11-25 NOTE — Telephone Encounter (Signed)
The MRI report had been with film. Report is back on your desk for review and for call back to patient per previous note. Patient ph# is [phone].

## 2010-12-03 LAB — BASIC METABOLIC PANEL
BUN: 21 mg/dL (ref 6–23)
CO2: 26 mEq/L (ref 19–32)
Calcium: 9.3 mg/dL (ref 8.4–10.5)
Chloride: 104 mEq/L (ref 96–112)
Creatinine, Ser: 0.93 mg/dL (ref 0.4–1.5)
GFR calc Af Amer: >60 mL/min (ref >60)
GFR calc non Af Amer: >60 mL/min (ref >60)
Glucose, Bld: 111 mg/dL — ABNORMAL HIGH (ref 70–99)
Potassium: 4.1 mEq/L (ref 3.5–5.1)
Sodium: 137 mEq/L (ref 135–145)

## 2010-12-03 LAB — HEMOGLOBIN AND HEMATOCRIT, BLOOD
HCT: 40.7 % (ref 39.0–52.0)
Hemoglobin: 14.1 g/dL (ref 13.0–17.0)

## 2010-12-11 LAB — BLOOD GAS, ARTERIAL
Acid-Base Excess: 0 mmol/L (ref 0.0–2.0)
Bicarbonate: 24.2 mEq/L — ABNORMAL HIGH (ref 20.0–24.0)
FIO2: 0.21 %
O2 Saturation: 94.5 %
Patient temperature: 37
TCO2: 21.4 mmol/L (ref 0–100)
pCO2 arterial: 39.5 mmHg (ref 35.0–45.0)
pH, Arterial: 7.404 (ref 7.350–7.450)
pO2, Arterial: 71.4 mmHg — ABNORMAL LOW (ref 80.0–100.0)

## 2010-12-24 ENCOUNTER — Encounter: Payer: Self-pay | Admitting: Orthopedic Surgery

## 2011-01-09 ENCOUNTER — Encounter: Payer: Self-pay | Admitting: Orthopedic Surgery

## 2011-01-09 ENCOUNTER — Ambulatory Visit (INDEPENDENT_AMBULATORY_CARE_PROVIDER_SITE_OTHER): Payer: Medicare Other | Admitting: Orthopedic Surgery

## 2011-01-09 ENCOUNTER — Ambulatory Visit: Payer: MEDICARE | Admitting: Orthopedic Surgery

## 2011-01-09 DIAGNOSIS — M1710 Unilateral primary osteoarthritis, unspecified knee: Secondary | ICD-10-CM

## 2011-01-09 DIAGNOSIS — M23302 Other meniscus derangements, unspecified lateral meniscus, unspecified knee: Secondary | ICD-10-CM

## 2011-01-09 DIAGNOSIS — IMO0002 Reserved for concepts with insufficient information to code with codable children: Secondary | ICD-10-CM

## 2011-01-09 NOTE — Progress Notes (Signed)
[place] comes in complaining of RIGHT knee pain over the medial aspects and anterior aspects of the knee exacerbated by stair climbing, kneeling, and squatting is at a previous RIGHT knee arthroscopy. He requested injection.  I took an x-ray not too long ago and it really does not show a lot of joint space narrowing.  We discussed options and decided to go with arthroscopic surgery to assess him for re\re tear of his meniscus versus chondromalacia patella versus accelerated arthritis of the medial compartment.  Please see history and physical for details.

## 2011-01-10 ENCOUNTER — Encounter (HOSPITAL_COMMUNITY): Payer: Medicare Other

## 2011-01-10 ENCOUNTER — Other Ambulatory Visit: Payer: Self-pay | Admitting: Orthopedic Surgery

## 2011-01-10 ENCOUNTER — Telehealth: Payer: Self-pay | Admitting: Orthopedic Surgery

## 2011-01-10 DIAGNOSIS — M23329 Other meniscus derangements, posterior horn of medial meniscus, unspecified knee: Secondary | ICD-10-CM

## 2011-01-10 DIAGNOSIS — M23302 Other meniscus derangements, unspecified lateral meniscus, unspecified knee: Secondary | ICD-10-CM

## 2011-01-10 LAB — BASIC METABOLIC PANEL
BUN: 16 mg/dL (ref 6–23)
CO2: 29 mEq/L (ref 19–32)
Chloride: 103 mEq/L (ref 96–112)
GFR calc non Af Amer: >60 mL/min (ref >60)
Glucose, Bld: 118 mg/dL — ABNORMAL HIGH (ref 70–99)
Potassium: 4 mEq/L (ref 3.5–5.1)
Sodium: 139 mEq/L (ref 135–145)

## 2011-01-10 LAB — SURGICAL PCR SCREEN

## 2011-01-10 LAB — HEMOGLOBIN AND HEMATOCRIT, BLOOD
HCT: 42.9 % (ref 39.0–52.0)
Hemoglobin: 14.5 g/dL (ref 13.0–17.0)

## 2011-01-10 NOTE — Telephone Encounter (Signed)
Contacted insurer [employer]) re: out-patient surgery scheduled 01/13/11 at [facility] [place] [place] [id number]. Spoke w/Phyllis L., intake coordinator at ph# [phone]; no pre-authorization required for out-patient procedure for in-network provider under patient's plan.

## 2011-01-13 ENCOUNTER — Ambulatory Visit (HOSPITAL_COMMUNITY)
Admission: RE | Admit: 2011-01-13 | Discharge: 2011-01-13 | Disposition: A | Discharge status: Home / Self Care | Payer: Medicare Other | Source: Ambulatory Visit | Attending: Orthopedic Surgery | Admitting: Orthopedic Surgery

## 2011-01-13 DIAGNOSIS — G4733 Obstructive sleep apnea (adult) (pediatric): Secondary | ICD-10-CM | POA: Insufficient documentation

## 2011-01-13 DIAGNOSIS — I10 Essential (primary) hypertension: Secondary | ICD-10-CM | POA: Insufficient documentation

## 2011-01-13 DIAGNOSIS — M23305 Other meniscus derangements, unspecified medial meniscus, unspecified knee: Secondary | ICD-10-CM | POA: Insufficient documentation
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
## 2011-01-14 NOTE — Procedures (Signed)
NAME:  Christopher [name], Christopher [name]              ACCOUNT NO.:  [id number]   MEDICAL RECORD NO.:  [id number]         PATIENT TYPE:  POUT   LOCATION:  RESP                          FACILITY:  [facility]   PHYSICIAN:  Edward L. Juanetta Gosling, M.D.DATE OF BIRTH:  [birth year]   DATE OF PROCEDURE:  12/13/2008  DATE OF DISCHARGE:                            PULMONARY FUNCTION TEST   1. Spirometry is normal.  2. Lung volumes are normal.  3. DLCO is mildly reduced.  4. Arterial blood gas is normal.      Edward L. Juanetta Gosling, M.D.  Electronically Signed     ELH/MEDQ  D:  12/14/2008  T:  12/15/2008  Job:  [id number]

## 2011-01-15 ENCOUNTER — Ambulatory Visit (INDEPENDENT_AMBULATORY_CARE_PROVIDER_SITE_OTHER): Payer: Medicare Other | Admitting: Orthopedic Surgery

## 2011-01-15 DIAGNOSIS — M25469 Effusion, unspecified knee: Secondary | ICD-10-CM

## 2011-01-15 DIAGNOSIS — Z9889 Other specified postprocedural states: Secondary | ICD-10-CM

## 2011-01-15 MED ORDER — DIPHENHYDRAMINE HCL 25 MG PO CAPS: 25.0000 mg | ORAL_CAPSULE | ORAL | Status: DC | PRN

## 2011-01-15 MED ORDER — HYDROCODONE-ACETAMINOPHEN 10-325 MG PO TABS: 1.0000 | ORAL_TABLET | ORAL | Status: DC | PRN

## 2011-01-15 NOTE — Progress Notes (Signed)
.  date   Date of surgery the 14th  Procedure arthroscopy partial medial meniscectomy  Postop day 2  Complaints of severe RIGHT knee pain severe RIGHT knee swelling pain unrelieved by dialogue.  Patient thought to be ALLERGIC to codeine and hydrocodone.  Exam reveals a tense swollen knee joint suture line intact over the portals tenderness over the joint.  Aspirated red-colored synovial fluid from obvious bleeding into the joint total aspiration approximately 50 cc  Patient fell a little bit better.  Ace Wrap applied.  Patient advised to rest use ice return Monday for possible repeat aspiration  No signs of infection are noted  Sutures were removed from the portals.  Patient was placed on hydrocodone with Benadryl.

## 2011-01-15 NOTE — Progress Notes (Signed)
Aspiration RIGHT knee  Verbal consent  Time out  Lateral approach  Alcohol prep.  Ethyl chloride anesthesia  Needle was introduced through the lateral suprapatellar approach.  We aspirated 50 cc of red tinged fluid  No complications

## 2011-01-16 ENCOUNTER — Ambulatory Visit: Payer: Medicare Other | Admitting: Orthopedic Surgery

## 2011-01-17 NOTE — H&P (Signed)
[place]. [facility]  Patient:    Christopher [name], Christopher [name]                     MRN: [id number] Adm. Date:  [id number] Attending:  Danella Penton                         History and Physical  HISTORY OF PRESENT ILLNESS:  Christopher [name] is a gentleman who was seen by me initially at the beginning of the year because of back pain for almost four months.  The patient was complaining of back pain localized to the lumbar area with some radiation down to the legs but not below the knee.  The patient has been unable to walk.  The patient had been seen by orthopaedic surgeon who proceed with the lumbar myelogram.  According to the patient, he was told that he needed some type of fusion.  Nevertheless by the time I saw him he was was having quite a bit of pain. He had difficulty standing.  Because of the findings, both clinically and radiologically he is being admitted today for surgery.  FAMILY HISTORY: Mother is 56 with emphysema.  PAST MEDICAL HISTORY:  He has had bilateral knee surgery, right shoulder surgery and appendectomy.  ALLERGIES:  The patient is allergic to Prednisone, morphine, Celebrex, Vioxx and some antibiotic.  By the time I saw the patient he was taking Demerol and Robaxin.  SOCIAL HISTORY:  He is a heavy smoker and he was going to stop smoking which indeed he did.  REVIEW OF SYSTEMS:  He complains of chest pain, indigestion and back pain.  PHYSICAL EXAMINATION:  GENERAL:  The patient came to my office twice with his wife. He has quite a bit of difficulty sitting and standing. He kept his back flexed to prevent any pain.  HEENT:  Normal.  NECK:  He has some discomfort with lateralization.  LUNGS:  Normal.  HEART: Sounds normal.  ABDOMEN:  Normal.  EXTREMITIES:  There is a scar in the right shoulder from previous surgery.  NEUROLOGIC: Mental status normal.  Cranial nerves normal. Reflexes 2+. No Babinski. Straight leg raising is  positive bilaterally at 30 degrees.  Finger clench maneuver is negative bilaterally.  The patient had difficulty walking on tip toes and heels.  Squatting being difficult for him.  Sensation seemed to be normal.  The MRI showed that indeed at the level of L1 and 2 there is mild degenerative disc disease which with the repeat MRI has improved.  The majority of findings were at L5-S1 where he has a bad case of degenerative disc disease with bilateral foraminal stenosis.  There is almost no space at the level of the disc space.  CLINICAL IMPRESSION: 1. Degenerative disc disease at the level of L5-S1 producing bilateral S1 foraminal stenosis. 2. Mild L1-L2 degenerative disc disease.  RECOMMENDATIONS:  The patient is being admitted for surgery.  The procedure will be a bilateral L5-S1 diskectomy interbody fusion using bone and pedicle screws.  Also we are going to do a posterior lateral fusion.  We are going to be using Allograft.  The patient knows about the risks of the surgery such as no improvement whatsoever, infection, CSF leak, worsening of the pain, need for further surgery and all the risks associated with his general anesthesia. DD:  12/24/99 TD:  12/24/99 Job: [id number]

## 2011-01-17 NOTE — Cardiovascular Report (Signed)
NAME:  Christopher [name], Christopher [name] NO.:  [id number]   MEDICAL RECORD NO.:  [id number]          PATIENT TYPE:  OIB   LOCATION:  1965                         FACILITY:  [facility]   PHYSICIAN:  Vida Roller, M.D.   DATE OF BIRTH:  08-13-1956   DATE OF PROCEDURE:  07/21/2005  DATE OF DISCHARGE:                              CARDIAC CATHETERIZATION   PRIMARY CARE PHYSICIAN:  Oneal Deputy. Juanetta Gosling, M.D.   HISTORY OF PRESENT ILLNESS:  Christopher [name] is  a 55 year old man with multiple  cardiac risk factors.  He came in with classic angina and was referred for  heart catheterization.   PROCEDURES PERFORMED:  1.  Left heart catheterization  2.  Selective coronary angiography.  3.  Left ventriculography.   PROCEDURE IN DETAIL:  After obtaining informed consent the patient was  brought to the cardiac catheterization laboratory in a fasting state. The  area was prepped and draped in the usual sterile manner.  Local anesthetic  was obtained over the right groin using 1% lidocaine without epinephrine.  The right femoral artery was cannulated using the modified Seldinger  technique with a 4-French 10 cm sheath and left heart catheterization was  performed using a 4-French Judkins left #4 4-French Judkins right #4 4-  French pigtail catheter.  The pigtail catheter was used for left  ventriculography at 30 degrees RAO view.  At the conclusion of the  procedures the catheters were removed.  The patient was moved back to the  cardiology holding area.  The femoral artery sheath was removed and  hemostasis was obtained using direct manual pressure.  At the conclusion  there was no evidence of ecchymosis or hematoma formation.  Distal pulses  were intact.  Total fluoroscopic time was 1.8 minutes, total ionized  contrast was 60 mL.   RESULTS:  1.  Left main coronary artery is a moderate caliber vessel which is      angiographically normal.  2.  Left anterior descending coronary artery is a small  caliber vessel with      a single branching diagonal.  There is no significant disease.  3.  The circumflex coronary artery is a small vessel which is nondominant      and has no significant disease.  4.  The right coronary artery is a small caliber dominant vessel with no      significant disease.  It is a moderate caliber posterior descending      coronary artery.  5.  Left ventriculogram reveals preserved LV systolic function, estimated      ejection fraction about 50%.  No wall motion abnormalities and no mitral      regurgitation.   ASSESSMENT:  1.  Nonobstructive coronary disease.  2.  Slow normal left ventricular ejection fraction.   RECOMMENDATIONS:  Echocardiogram to insure there is no significant valvular  heart disease.  Reaffirm that the ejection fraction is that it is.  See him  back in follow-up a couple weeks my office.      Vida Roller, M.D.  Electronically Signed     JH/MEDQ  D:  07/21/2005  T:  07/21/2005  Job:  [id number]   cc:   Oneal Deputy. Juanetta Gosling, M.D.  Fax: [phone]

## 2011-01-17 NOTE — Discharge Summary (Signed)
[place]. [facility]  Patient:    EZEKIAL, ARNS                     MRN: [id number] Adm. Date:  [id number] Disc. Date: [id number] Attending:  Danella Penton                           Discharge Summary  ADMISSION DIAGNOSIS:  Rule out cervical wound infection, possible _______, superficial cervical wound infection.  HISTORY OF PRESENT ILLNESS:  Mr. Docter underwent anterior cervical 5-6 diskectomy.  The patient did well.  He was discharged the following day, and he came to my office on May 8 complaining of some swelling.  _______ showed swelling of the neck, and there was an area of redness.  There was a small amount of pus coming from the wound.  Because I was concerned about the possibility of infection, he was brought immediately to the hospital.  LABORATORY AND X-RAY DATA:  Superficial wound culture is growing gram-positive cocci.  The white cell count was 15.6, and the electrolytes normal.  HOSPITAL COURSE:  The patient was taken to surgery, and exploration of the wound showed mostly he had a superficial wound infection.  Deep inside what we found was mostly some old clot.  The area was irrigated, and drain was left. Today, he is feeling much better, his swelling is much better, and he is going to go home.  CONDITION ON DISCHARGE:  Improvement.  DISCHARGE MEDICATIONS:  Keflex 250 mg one q.6h.  I will be calling him as soon as I get the final results from the laboratory.  DISCHARGE INSTRUCTIONS:  Diet:  He is going to drink a lot of fluids. Activity:  Not to drive.  DISCHARGE FOLLOWUP:  Will be seen in my office next week to have the stitches out. DD:  01/08/01 TD:  01/11/01 Job: [id number]

## 2011-01-17 NOTE — Consult Note (Signed)
NAME:  Christopher [name], Christopher [name]              ACCOUNT NO.:  [id number]   MEDICAL RECORD NO.:  [id number]           PATIENT TYPE:   LOCATION:                                 FACILITY:   PHYSICIAN:  R. Roetta Sessions, M.D. DATE OF BIRTH:  June 17, 1956   DATE OF CONSULTATION:  07/11/2004  DATE OF DISCHARGE:                                   CONSULTATION   REASON FOR CONSULTATION:  Refractory gastroesophageal reflux disease,  abdominal pain, and diarrhea.   HISTORY OF PRESENT ILLNESS:  Christopher [name] is a 55 year old Caucasian male who  reports 3 year history of worsening gastroesophageal reflux disease  symptoms. He complains of severe epigastric burning, typically post  ingestion of liquids such as coffee. He is also complaining of odynophagia  and solid food dysphagia as well. He notes increased abdominal bloating. He  also reports generalized abdominal pain in all other lower quadrants as  well. He has been taking Rolaids and Pepcid over-the-counter with minimal  relief. He also notes heartburn and indigestion. Also complains of water  brash. His symptoms are worse nocturnally. He also notes intermittent nausea  and emesis x2 last week. He has noted postprandial diarrhea at least usually  3 times a day, every day. He denies any rectal bleeding or melena. He does  report severe urgency. He has tried Aciphex for his gastroesophageal reflux  disease symptoms with minimal relief.   PAST MEDICAL HISTORY:  Heartburn, hemorrhoid disease. Colonoscopy in 1997,  which revealed polyps. Questionable etiology. H-pylori positive although  unable to tolerate treatment due to nausea and vomiting.   PAST SURGICAL HISTORY:  Cholecystectomy, cervical fusion, lower back fusion,  bilateral knee surgeries, right shoulder surgery and rectal polyps.  Umbilical hernia repair in 2002 and appendectomy.   CURRENT MEDICATIONS:  Ultracet 37.5 mg q.i.d., Restoril 30 mg daily,  Flexeril 10 mg daily, Bentyl as needed, Relafen  750 mg b.i.d., Lidoderm 5%  patch as needed.   ALLERGIES:  PREDNISONE, SOMETHING IN THE PREVPAC, CODEINE, CELEBREX,  PERCOCET, MORPHINE, DOXYCYCLINE, CO-GESIC AND ZOSTRIX.   FAMILY HISTORY:  No known family history of inflammatory bowel disease,  colorectal carcinoma of the liver, chronic gastrointestinal problems. Mother  age 80 with history significant of chronic obstructive pulmonary disease and  diabetes mellitus. Father alive at age 69 with diabetes mellitus and  dementia. He has 3 sisters, one with hypertension.   SOCIAL HISTORY:  Christopher [name] has been married for 17 years. He reports a 30  pack year tobacco use history. Denies any alcohol or drug use.   REVIEW OF SYSTEMS:  CONSTITUTIONAL:  He has reported a steady weight gain.  Is complaining of some fatigue. Appetite is good. Denies any early satiety.  Denies any fever or chills. CARDIOVASCULAR:  Has occasional palpitations.  Denies any chest pain currently, although does note significant chest pain  along with his reflux symptoms. PULMONARY:  Denies any shortness of breath,  dyspnea, cough or hemoptysis. GASTROINTESTINAL:  See HPI.   PHYSICAL EXAMINATION:  VITAL SIGNS:  Weight 221 pounds. Height 66 inches.  Temperature 98.4. Blood pressure 150/90. Pulse 100.  GENERAL:  Christopher [name] is a 55 year old obese Caucasian male who is alert,  oriented, pleasant, cooperative and in no acute distress. He is accompanied  by his wife today.  HEENT:  Sclerae clear. Nonicteric. Conjunctivae pink. Oropharynx pink and  moist without any lesions.  NECK:  Supple without any mass or thyromegaly.  HEART:  Regular rate and rhythm with normal S1 and S2 without any murmurs,  clicks, rubs, or gallops.  LUNGS:  Emphysematous changes throughout. No diffuse wheezes or crackles  noted.  ABDOMEN:  Protuberant with positive bowel sounds x4. No bruits auscultated.  He does have scar at the umbilicus from previous hernia repair. Midline  vertical scar,  which is well healed. There appears to be a residual small,  easily reducible umbilical hernia that is non-tender and soft. No rebound  tenderness or guarding. No palpable hepatosplenomegaly or mass although the  examination is limited due to the patient's body habitus.  RECTAL:  He does have circumferential peri-rectal erythema with 2 small 1 cm  external hemorrhoids at 7 and 10 o'clock that are not actively bleeding.  Good sphincter tone. Small amount of Hemoccult positive stools obtained from  the vault. No internal masses palpated.  EXTREMITIES:  2+ pedal pulses bilaterally. Trace edema.  SKIN:  Pink, warm and dry without rash or jaundice.   ASSESSMENT:  Christopher [name] is a 55 year old Caucasian male with several year  history of worsening refractory gastroesophageal reflux disease symptoms. He  has tried Aciphex with minimal relief. He also notes odynphagia and  dysphagia and symptoms are suspicious for reflux esophagitis and/or  gastritis. Further evaluation is definitely warranted. He also notes daily  post prandial diarrhea and significant urgency. Stool was found to be  hemoccult positive on examination today as well. Further evaluation is  warranted to rule out inflammatory bowel disease. He also has dysuria today  and will check a urinalysis as well. His symptoms are obscure and therefore,  will obtain further studies to rule out pancreatitis as well. He has a  history of colonic polyps, last colonoscopy reportedly in 1996 or 1997.   RECOMMENDATIONS:  1.  Lab studies include CBC, liver function studies, amylase, lipase,      urinalysis, and metabolic panel.  2.  I have recommended colonoscopy and esophagogastroduodenoscopy with Dr.      Jena Gauss in the very near future. I have discussed both procedures      including risks and benefits to include, but not limited to, bleeding,      infection, perforation, or drug reaction. He agrees with the plan and     consent will be obtained.   3.  I have given him samples of Aciphex 20 mg daily to be taken for the next      2 weeks. He will let us know if      these work well for him.  4.  Further recommendations pending procedures.   We would like to thank Dr. Malvin Johns for allowing [place] to participate in the  care of Mr. Grau.     Kand   KC/MEDQ  D:  07/11/2004  T:  07/11/2004  Job:  [id number]   cc:   R. Roetta Sessions, M.D.  [address]  [place]  [place] [address]  Fax: [phone]   Barbaraann Barthel, M.D.  [address]  [place]  [place] [address]  Fax: [phone]

## 2011-01-17 NOTE — Procedures (Signed)
NAME:  ARWIN, BISCEGLIA              ACCOUNT NO.:  [id number]   MEDICAL RECORD NO.:  [id number]          PATIENT TYPE:  OUT   LOCATION:  RAD                           FACILITY:  [facility]   PHYSICIAN:  [place] Bing, M.D. [facility] OF BIRTH:  1956/06/27   DATE OF PROCEDURE:  08/06/2005  DATE OF DISCHARGE:                                  ECHOCARDIOGRAM   REFERRING PHYSICIAN:  Ramon Dredge L. Juanetta Gosling, M.D./Jeffrey Dorethea Clan, M.D.   CLINICAL DATA:  A 55 year old gentleman with chest pain.   M-MODE TRACINGS:  Aorta 3.0, left atrium 4.4, septum 1.2, posterior wall  1.3, LV diastole 4.4, LV systole to 3.2.   FINDINGS:  1.  Technically suboptimal, but adequate echocardiographic study.  2.  Normal left and right atrial size.  3.  Right ventricular is prominent; no hypertrophy; normal function.  4.  Normal mitral valve; mild annular calcification.  5.  Normal trileaflet aortic valve.  6.  Normal tricuspid and pulmonic valve; normal proximal pulmonary artery.  7.  Normal left ventricular size; borderline hypertrophy; normal regional      and global systolic function.  8.  Normal IVC.      [place] Bing, M.D. [facility]  Electronically Signed     RR/MEDQ  D:  08/08/2005  T:  08/08/2005  Job:  [id number]

## 2011-01-17 NOTE — Op Note (Signed)
[facility]  Patient:    Christopher [name], Christopher [name] Visit Number: [id number] MRN: [id number]          Service Type: OBV Location: 3A A323 01 Attending Physician:  Barbaraann Barthel Dictated by:   Barbaraann Barthel, M.D. Proc. Date: 07/07/01 Admit Date:  07/07/2001 Discharge Date: 07/08/2001   CC:         Kari Baars, M.D.   Operative Report  PREOPERATIVE DIAGNOSES: 1. 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 2. Umbilical hernia.  POSTOPERATIVE DIAGNOSES: 1. 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 2. Umbilical hernia.  OPERATION: 1. Laparoscopic cholecystectomy. 2. Umbilical hernia repair.  SURGEON:  Barbaraann Barthel, M.D.  NOTE:  This is a 55 year old white male who had recurrent episodes of postprandial epigastric discomfort and right upper quadrant pain radiating to his back.  This was accompanied at times with vomiting and with nausea. Workup as an outpatient showed him to have a normal sonogram with of the right upper quadrant; however, hepatobiliary scan showed diminished ejection fraction suggestive of biliary dyskinesia.  Liver function studies and amylase were within normal limits.  We discussed the risks laparoscopic cholecystectomy including but not limiting our discussion to bleeding, infection, damage to bile ducts, perforation of organs, and transitory diarrhea.  An informed consent was obtained.  We also noted on physical examination the patient has an umbilical hernia that was prominent but not very large, and we told him we would be repairing this at the same surgery in order to submit him to only one anesthetic experience. We discussed the risks also to include those of bleeding, infection, and recurrence. Informed consent was obtained for this as well.  GROSS OPERATIVE FINDINGS:  The patient had a small umbilical hernia approximately the size of a dime present.  He also had considerable fatty infiltration of the  gallbladder with multiple thick adhesions about the gallbladder, a small cystic duct which was not cannulated for cholangiogram. The right upper quadrant otherwise appeared to be normal other than a fatty infiltrated liver.  TECHNIQUE:  The patient was placed in the supine position.  After adequate administration of general anesthesia by endotracheal intubation, his entire abdomen was prepped with Betadine solution and draped in the usual manner. Prior to this, a Foley catheter was aseptically inserted.  With the patient in Trendelenburg, a periumbilical incision was carried out over the superior aspect of the umbilicus.  I was just superior to the umbilical hernia, and then we placed the 11 mm cannula with the Visiport technique through this incision.  Three other cannulas were placed under direct camera vision, an 11 mm cannula in the epigastrium and two 5 mm cannulas in the right upper quadrant laterally.  The gallbladder was grasped.  Its adhesions were taken down.  This was rather tedious, and there were many; however, there were no problems.  Some of them needed to be clipped because the omentum was vascular and adhesed to the gallbladder.  The gallbladder was then grasped, working the way down towards Hartmanns pouch.  Hartmanns pouch was then grasped, and the cystic duct was identified, triply silver clipped on the side of the common bile duct and singly silver clipped on the side of the gallbladder.  The cystic artery was likewise identified and triply silver clipped and divided. The gallbladder was then removed uneventfully from the hepatic bed.  There was very minimal oozing.  I elected to remove the gallbladder with an Endocatch device.  This was done without a problem, removing the gallbladder from the  epigastric incision.  The areas of oozing on the liver were controlled with the cautery device, and I elected to leave a small piece of Surgicel in the hepatic bed.  We then  irrigated and checked for hemostasis.  This was deemed complete.  Then placing the camera through the epigastric incision, I took down the adhesions around the umbilical hernia and then removed all cannulas and desufflated the abdomen and then closed the hernia with interrupted figure-of-eight 0 Prolene sutures.  I then infiltrated the area of the hernia repair with 0.5% Sensorcaine to help with postoperative discomfort.  I then closed the epigastric incision with 0 Polysorb in the area of the fascia, and then all other skin incisions were closed with the stapling device. Prior to closure, all sponge, needle, and instrument counts were found to be correct.  Estimated blood loss was minimal.  No drains were placed.  The patient received 2200 cc crystalloid intraoperatively.  There were no complications. Dictated by:   Barbaraann Barthel, M.D. Attending Physician:  Barbaraann Barthel DD:  07/07/01 TD:  07/08/01 Job: [id number]

## 2011-01-17 NOTE — Op Note (Signed)
[place]. [facility]  Patient:    Christopher [name], Christopher [name]                     MRN: [id number] Proc. Date: 12/22/00 Adm. 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  PROCEDURES:  Anterior C5-6 diskectomy and decompression of the spinal cord, bilateral foraminotomy, iliac crest bone graft 8 mm, Synthes plate, microscope.  SURGEON:  Tanya Nones. Jeral Fruit, M.D.  ASSISTANT:  Payton Doughty, M.D.  CLINICAL HISTORY:  Mr. Christopher [name] is a 55 year old gentleman who underwent L5-S1 fusion several months ago.  He had been complaining of neck pain with radiation to both upper extremities.  He had failed conservative treatment. X-rays show a bad case of spondylosis at the level of 5-6 with narrowing of the foramen.  The patient has weakness of both biceps.  The patient wanted to go ahead with surgery.  He knew of the risks, as explained in the history and physical.  DESCRIPTION OF PROCEDURE:  The patient was taken to the OR.  After intubation, the left side of the neck was prepped with Betadine.  This gentleman is really short and obese.  It was difficult to see the neck.  We put 10-pound traction plus roll between the shoulder blades.  Finally we were able to develop a space.  Transverse incision was made through the skin, down to the platysma, and carried down to the cervical spine.  We put two needles.  One was at the level of 3-4 and the other one at the level of 4-5.  From then on, retraction was done lower, and finally we found the 5-6 space.  There was a large osteophyte, which was removed.  We opened the disk space, and we removed quite a bit of degenerative disk.  We brought the microscope into the area, and we found that indeed he had some fragment going bilaterally.   Foraminotomy was done.  A total gross diskectomy was achieved.  Then we drilled the end plates, and we removed the spondylosis.  At the end, we had plenty of room for both C6 nerve roots as well as at the spinal cord.  A piece of bone graft of iliac crest of 8 mm height was inserted without any problem.  This was followed by a plate using four screws.  Lateral C-spine showed that the upper part was normal.  It was difficult to see the lower part because of the size of the patient.  Having done this, the area was irrigated.  Investigation of the trachea and esophagus and carotid artery was normal.  From the on, hemostasis was done with bipolar, and the wound was closed with Vicryl and Steri-Strip. The patient did well. DD:  12/22/00 TD:  12/23/00 Job: [id number]

## 2011-01-17 NOTE — H&P (Signed)
[place]  Patient:    Christopher [name], Christopher [name]                     MRN: [id number] Adm. Date:  [id number] Attending:  Danella Penton                         History and Physical  HISTORY OF PRESENT ILLNESS:  Mr. Sagar is a gentleman who underwent L5-S1 diskectomy followed by fusion using pedicle screws. He did well with all of this, yet he had been complaining of pain in his neck radiating to both upper extremities. The pain is getting worse. He has had surgical treatment without any improvement. Still he had some residual back pain and physical therapy has helped him. We did an x-ray which showed that indeed he has cervical spondylosis at the level of C5-6. The patient wanted to go ahead with surgery.  PAST MEDICAL HISTORY: 1. Bilateral knee surgeries. 2. Right shoulder surgery. 3. Appendectomy. 4. Fusion at level of L5-S1.  ALLERGIES:  PREDNISONE, MORPHINE, CELEBREX, VIOXX, SOME TYPE OF ANTIBIOTIC.  SOCIAL HISTORY:  The patient is a heavy smoker and although he stopped smoking, never the less, he is back to smoking. He drinks socially.  REVIEW OF SYSTEMS:  He complains of some chest pain and some back pain.  PHYSICAL EXAMINATION:  GENERAL:  The patient came to my office and he was walking with decreased reflexes due to the lumbar spine.  HEENT:  Normal examination.  NECK:  There are no bruit. He is able to flex and extend but there is pain that goes to the shoulders.  LUNGS:  Normal.  CARDIOVASCULAR:  Heart sounds normal.  ABDOMEN:  Normal.  EXTREMITIES:  There is a scar on the knees from previous surgeries.  NEUROLOGIC:  Mental status normal. Cranial nerves II-XII normal. Strength:  He has normal deltoid and normal triceps. I can break the biceps and the wrist extensors. In the lower extremities his strength is normal although he has some decrease of strength mostly secondary to the pain that he was having after surgery.  Reflexes symmetrical with decrease in both biceps. Coordination normal.  LABORATORY AND ACCESSORY DATA:  The cervical myelogram followed by a CT scan showed that he has a bad case of cervical spondylosis at the level of 5-6.  IMPRESSION:  C5-C6 spondylosis with radiculopathy.  PLAN:  The patient wants to go ahead with surgery. He knows all the risks like infection, CSF leak, worsening of pain, paralysis, need for further surgery and also the possibility that this type of surgery is not going to help him with the headaches. The patient wants to go ahead and declined a second opinion. DD:  12/22/00 TD:  12/22/00 Job: [id number]

## 2011-01-17 NOTE — Group Therapy Note (Signed)
[facility]  Patient:    Christopher [name], Christopher [name] Visit Number: [id number] MRN: [id number]          Service Type: OBV Location: 3A A323 01 Attending Physician:  Barbaraann Barthel Dictated by:   Kari Baars, M.D. Admit Date:  07/07/2001 Discharge Date: 07/08/2001                               Progress Note  PRIMARY CARE PHYSICIAN:  Dr. Barbaraann Barthel.  SUBJECTIVE:  I have seen [name] last night and today on a "social basis." He is not having any active medical problems but I do provide his medical care.  He has had surgery and seems to be doing well.  PLAN:  I do not plan any other new treatments at this time.  I will plan to follow along with Dr. Malvin Johns as long as [name] is in the hospital. Dictated by:   Kari Baars, M.D. Attending Physician:  Barbaraann Barthel DD:  07/08/01 TD:  07/09/01 Job: [id number]
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
## 2011-01-17 NOTE — Op Note (Signed)
[place]. [facility]  Patient:    Christopher [name], Christopher [name]                     MRN: [id number] Proc. Date: 12/24/99 Adm. Date:  [id number] Attending:  Danella Penton                           Operative Report  PREOPERATIVE DIAGNOSIS:  Degenerative disk disease L5-S1 with bilateral L5-S1 stenosis.  Chronic low back pain.  L1-L2 degenerative disk disease with a bulging disk.  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  Chronic low back pain.  L1-L2 degenerative disk disease with a bulging disk.  OPERATION PERFORMED:  Bilateral L5 laminectomy, medial facetectomy, bilateral L5-S1 diskectomy.  Decompression of the L5 and S1 nerve root.  Interbody allograft wedge bone graft.  Insertion of pedicular screw from L5 to S1. Posterolateral fusion from L5 transverse process to the sacrum.  Midas Rex.  SURGEON:  Tanya Nones. Jeral Fruit, M.D.  ASSISTANT:  Danae Orleans. Venetia Maxon, M.D.  ANESTHESIA:  INDICATIONS FOR PROCEDURE:  Christopher [name] is a gentleman complaining of back pain with radiation to both legs for several months.  The patient had been unable to work.  The patient was fulled worked up by orthopedic surgery and eventually he was advised that he needed some type of fusion.  I saw him at the beginning of the year and I found that indeed he has a bad case of degenerative disk disease at the level of 5-1 with a stenosis compromising the L5 and S1 nerve root.  Also he has a bad case of L1-L2 degenerative disk disease with a bulging disk.  The patient is a heavy smoker.  We agreed with surgery but the patient needed to be off cigarettes to improve his healing. There was a problem with the workmen [name] and finally, he was approved for surgery.  Nevertheless the insurance company wanted him to have a nicotine test which was eventually done and was negative. The patient ____________ for surgery.  He knows  about the risks.  He and his wife were present in both locations.  The risks will be worsening of the pain, need for further surgery, failure to improve the pain.  CSF leak, need for further surgery, collapse of the bone graft, collapse and failure of the hardware.  The patient declined a second opinion.  DESCRIPTION OF PROCEDURE:  The patient was taken to the operating room and he was positioned in a prone manner.  The back was prepped with Betadine.  A midline incision from L4 to S1 was made.  Muscle was retracted laterally.  The patient is obese and there was a big layer of adipose tissue.  Retraction was done.  Indeed what we found is that he has overgrowth of facet joints at the level of 5-1.  The ligament was calcified.  We proceeded with the removal of the spinous process of L5 and bilateral laminectomies.  We found the dura mater.  Indeed there was foraminal stenosis compromising the L5-S1 nerve root. We did medial facetectomy.  Decompression of the L5-S1 nerve root was achieved.  The patient had quite a bit of scar tissue and most of it compromising the S1 nerve root.  Lysis was done.  At the end we had plenty of room for the L5 and S1 nerve root.  We opened the disk space and we found  the disk space was narrow.  Using the Epstein curet, we did a total diskectomy. Then we brought the curet from the Synthes system and we removed the end plate bilaterally.  The area was really narrow but at the end we were able to introduce two wedges of bone graft of 9 mm height.  This was done under fluoroscopy magnification and indeed the wedges were in good position.  Then we went laterally and we found the facet joint at the L5.  We ____________ to find the pedicle.  With the bone probe introducer at the pedicle L5 first on the left side and then the right side.  We had to reposition the one in the right side which was ____________ laterally.  Nevertheless at the end we had good position for  both pedicles.  In the right side, we found the pedicle of S1 and we introduced pedicle screw.  The ____________ at the level of L5 were 40 mm length x 6 mm wide. The one at the level of S1 on the right side was 40 mm and 7.2 mm wide.  This ____________ without any problem.  In the left one we found that the bone was soft and there was no really good grabbing of the screw.  Because we did not want to abort the procedure, we attempted several routes to introduce the pedicle screw but all of them were showing mostly soft bone.  Nevertheless what we did was use bone cement and ____________ space of magnification with the C-arm and we were able to introduce the pedicle screw. We were there about 10 minutes until it was really good solid uptake of the bone cement with the pedicle screw.  Having done this, we introduced the caps of the pedicle screws and we put the two rods ____________ 35 mm length which were bent to produce the normal lordosis.  X-ray AP and lateral showed good position.  Having done this, we verified again that the S1 and L5 nerve root bilateral were completely opened.  Then we went laterally.  We drilled the transverse process of L5 and the ala of the sacrum.  Bone from the lamina and spinous process were applied in this area.  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 Nevertheless we used Tisseal to prevent any CSF leak.  Then the area was irrigated.  A piece of fat was left to cover the dura mater.  The wound was closed with Vicryl and nylon.  The patient did well. DD:  12/24/99 TD:  12/25/99 Job: [id number]

## 2011-01-17 NOTE — Op Note (Signed)
NAME:  Christopher [name], Christopher [name]              ACCOUNT NO.:  [id number]   MEDICAL RECORD NO.:  [id number]          PATIENT TYPE:  AMB   LOCATION:  DAY                           FACILITY:  [facility]   PHYSICIAN:  Lionel December, M.D.    DATE OF BIRTH:  [birth year]   DATE OF PROCEDURE:  07/18/2004  DATE OF DISCHARGE:                                 OPERATIVE REPORT   PROCEDURE:  Esophagogastroduodenoscopy, followed by total colonoscopy with  terminal ileoscopy.   INDICATIONS:  Evonte is a 55 year old Caucasian male with a several-year  history of heartburn, whose symptoms have not responded to various PPIs.  He  has been on Aciphex for a few months, which did not help.  He therefore  stopped the medication.  He has previously been tried on Prilosec, which did  not work, and he developed abdominal pain with Nexium and therefore  discontinued it.  He also has a chronic diarrhea and therefore undergoing  EGD.  The procedure risks were reviewed with the patient and informed  consent was obtained.   PREMEDICATION:  Cetacaine spray for pharyngeal topical anesthesia, Demerol  50 mg IV, Versed 16 mg IV.   FINDINGS:  Procedures performed in endoscopy suite.  The patient's vital  signs and O2 saturation were monitored during procedure and remained stable.   PROCEDURE #1:  Esophagogastroduodenoscopy.   The patient was placed in the left lateral recumbent position and the  Olympus video scope was passed via oropharynx without any difficulty into  esophagus.   Esophagus:  Mucosa of the esophagus was normal throughout.  The GE junction  was located at 39 cm from the incisors.  No ring or stricture was noted.  There was a 3 cm size sliding hiatal hernia.  Diaphragmatic hiatus was at 42  cm.   Stomach:  It was empty and distended very well with insufflation.  Folds of  the proximal stomach were normal.  Examination of the mucosa at body,  antrum, and pyloric channel as well as angularis, fundus, and cardia  was  normal.   Duodenum:  Examination of the bulb revealed normal mucosa.  The scope was  passed to the second part of the duodenum, where mucosa and folds were  normal.  Endoscope was withdrawn and the patient prepared for procedure #2.   PROCEDURE #2:  Colonoscopy.   Rectal examination performed.  No abnormality noted on external or digital  exam.  Olympus video scope was placed in the rectum and advanced under  vision into the sigmoid colon and beyond.  Preparation was satisfactory.  The scope was passed into the cecum, which was identified by the ileocecal  valve and appendiceal orifice.  A short segment of TI was also examined and  was normal.  As the scope was withdrawn, colonic mucosa was examined for the  second time.  There were two small polyps at proximal and midtransverse  colon, which were ablated via cold biopsy and submitted in one container.  The mucosa of the rest of the colon was normal.  The rectal mucosa similarly  was normal.  The scope was retroflexed  to examine the anorectal junction and  small hemorrhoids were noted below the dentate line.  The endoscope was  straightened and withdrawn.  The patient tolerated the procedures well.   FINAL DIAGNOSES:  1.  No evidence of erosive or ulcerative esophagitis or Barrett's esophagus.  2.  Small sliding hiatal hernia.  3.  Normal examination of the stomach, first and second part of the      duodenum.  4.  Normal terminal ileoscopy.  5.  Two tiny polyps ablated via cold biopsy from transverse colon.  No      evidence of colitis.  6.  Small external hemorrhoids.  7.  Suspect he has irritable bowel syndrome and uncomplicated      gastroesophageal reflux disease.   RECOMMENDATIONS:  1.  Antireflux measures stressed to the patient.  2.  Prevacid SoluTab 30 mg p.o. b.i.d.  Prescription given for 60 with five      refills.  3.  Will stop his Bentyl and start him on __________ 5 mg p.o. q.a.m.  4.  Citrucel or equivalent  one tablespoonful daily.  5.  Upper abdominal ultrasound to further evaluate his elevated ALT.  I      suspect he has a fatty liver.  6.  I will contact the patient with biopsy results.  7.  He will return for OV in one month from now to assess response to      therapy.  He will have LFTs and serum calcium prior to that visit.     Naje   NR/MEDQ  D:  07/18/2004  T:  07/19/2004  Job:  [id number]   cc:   Barbaraann Barthel, M.D.  [address]  [place]  [place] [address]  Fax: [phone]   Oneal Deputy. Juanetta Gosling, M.D.  [address]  [place]  [place] [address]  Fax: [phone]

## 2011-01-17 NOTE — Discharge Summary (Signed)
[place]  Patient:    Christopher [name], Christopher [name]                     MRN: [id number] Adm. Date:  [id number] Disc. Date: 12/28/99 Attending:  Danella Penton                           Discharge Summary  ADMITTING DIAGNOSIS:  L5, S1 spondylosis with foraminal stenosis, degenerative disk disease, chronic back pain.  FINAL DIAGNOSIS:  L5, S1 spondylosis with foraminal stenosis, degenerative disk disease, chronic back pain.  HISTORY OF PRESENT ILLNESS:  The patient has a history of back pain radiating down to both legs.  Patient had been unable to walk.  Patient was a heavy smoker and, when I saw him in January, 2001, he was advised to stop smoking before we proceeded with surgery.  Patient did it.  He had a blood test for nicotine which was negative.  He was admitted for surgery.  LABORATORY DATA:  Normal.  HOSPITAL COURSE:  The patient was taken to surgery on April 24 and bilateral L5, S1 diskectomy followed by bone graft and the Bovie, and pedicle screw from L5 to S1 was done.  Also, posterolateral fusion from L5 to S1 was achieved using autograft.  The patient had quite a bit of pain which is expected in the first 48 hours but, now, he is ambulating and, today, he is walking.  The wound, although there is some swelling, is ______.  He is afebrile.  There is no weakness except for incisional pain and he wanted to go home today.  CONDITION ON DISCHARGE:  Improvement.  MEDICATIONS:  Percocet, Diazepam, Cipro.  DIET:  Regular.  ACTIVITY:  He is not to drive.  He is going to be seen by the home health service.  FOLLOW-UP:  To be seen by me in 2-1/2 weeks. DD:  12/28/99 TD:  12/29/99 Job: [id number]

## 2011-01-17 NOTE — Op Note (Signed)
[place]  Patient:    SAHAN, PEN                     MRN: [id number] Proc. Date: 01/06/01 Adm. Date:  [id number] Attending:  Danella Penton                           Operative Report  PREOPERATIVE DIAGNOSIS:  Rule out prevertebral abscess cervical.  POSTOPERATIVE DIAGNOSIS:  Inflammation of the prevertebral area.  No evidence of infection.  OPERATION:  Revision of the cervical wound.  SURGEON:  Tanya Nones. Jeral Fruit, M.D.  CLINICAL HISTORY:  Mr. Mayeux is a gentleman who about two weeks ago underwent anterior cervical diskectomy at the level of L5-6.  The patient did well. He was discharged without any problem.  He called complaining of some pain and some swelling.  I brought him to my office and indeed the cervical spine x-ray showed that he has a large prevertebral swelling.  He has no fever but the skin was a little bit red.  I was able to see a small amount of pus in the brace.  Because of that, we decided to bring him to surgery to rule out the possibility of an abscess. He had an esophagogram which was negative.  DESCRIPTION OF PROCEDURE:  The patient was taken to the operating room and after intubation, the neck was prepped with Betadine.  Incision was made through the previous wound.  Indeed we did not find any evidence of any infection.  There was no pus at all.  Nevertheless, we sent some of the specimen to the lab for culture.  Investigation all the way down to the cervical spine showed that the plate was in good position and there was no evidence of any infection.  We found there was quite a bit of inflammation. Nevertheless, the area was irrigated with copious amounts of saline solution. A Jackson-Pratt drain was left.  The wound was closed with nylon.  The patient did well and probably he will be discharged tomorrow. DD:  01/06/01 TD:  01/07/01 Job: [id number]

## 2011-01-17 NOTE — H&P (Signed)
. Christopher [name]  Patient:    Christopher [name], Christopher [name]                     MRN: [id number] Adm. Date:  [id number] Attending:  Danella Penton                         History and Physical  CHIEF COMPLAINT/HISTORY OF PRESENT ILLNESS: Christopher [name] is a gentleman who underwent anterior cervical diskectomy at the level of 5-6 on December 22, 2000 and states he had no problem whatsoever.  The patient has a history of heavy smoking.  Nevertheless, he is really short and really heavy for his height. He did well.  He went home the following day and he had been doing fairly well until about a few days ago when he started to have some swelling of the neck. Since last night he had been draining.  His wife had been treated by her medical physician for strep infection and she was worried there might be some contamination.  Nevertheless, I brought him in and on my inspection found that indeed he has quite a bit of swelling in that area and there is some pus draining from the wound.  I proceeded with emergency admission and did an esophagogram, which was essentially negative.  He is being taken for drainage of the abscess as soon as possible.  PAST MEDICAL/SURGICAL HISTORY:  1. Bilateral knee surgery.  2. Right shoulder.  3. Appendectomy.  4. Fusion at the level of L5-S1 and also C5-6.  ALLERGIES:  1. PREDNISONE.  2. MORPHINE.  3. CELEBREX.  4. VIOXX.  5. SOME ANTIBIOTIC.  SOCIAL HISTORY: He used to be a heavy smoker and right now told me he does not smoke.  He drinks socially.  FAMILY HISTORY: Unremarkable.  PHYSICAL EXAMINATION:  HEENT: Normal.  NECK: There is swelling of the left side.  The trachea is a little bit deviated to the right side.  He is able to swallow but has some difficulty. There are no bruits in the neck.  LUNGS: Clear.  CARDIAC: Heart sounds normal.  ABDOMEN: Normal.  EXTREMITIES: Normal pulses.  NEUROLOGIC: Normal.  LABORATORY  DATA: I obtained a lateral C, which showed good position of the graft and plate.  There were no abnormalities in relation of position or screw or plate.  CLINICAL IMPRESSION: Abscess of neck.  PLAN: The patient is being admitted immediately for drainage.  As I mentioned to him and his wife, never in 25 years have I seen a patient having infection in the anterior part of the neck.  I was worried because this, although it could be related to the wifes infection nevertheless was worried about the possibility of a small hole in the esophagus.  We did an esophagogram, which was essentially negative.  The patient is being admitted and we are going to get hold of infectious disease. DD:  01/06/01 TD:  01/07/01 Job: [id number]

## 2011-01-17 NOTE — Procedures (Signed)
[facility]  Patient:    Christopher [name], Christopher [name] Visit Number: [id number] MRN: [id number]          Service Type: OUT Location: RAD Attending Physician:  Fredirick Maudlin Dictated by:   Kari Baars, M.D. Admit Date:  01/06/2002 Discharge Date: 01/06/2002                                Stress Test  PROCEDURE:  Persantine Cardiolite graded exercise test.  REASON FOR PROCEDURE:  Chest pain.  INDICATIONS:  This patient is undergoing Persantine Cardiolite graded exercise testing because of chest pain.  There are no contraindications to Persantine Cardiolite graded exercise testing.  INTERPRETATION:  Persantine was infused per protocol with Cardiolite being injected at 6 minutes. The patient developed chest discomfort during Persantine infusion which resolved during recovery period.  There were no electrocardiographic changes suggestive of inducible ischemia.  IMPRESSION:  Cardiolite Persantine graded exercise test with no definite evidence of inducible ischemia, but Cardiolite images are pending. Dictated by:   Kari Baars, M.D. Attending Physician:  Fredirick Maudlin DD:  01/06/02 TD:  01/08/02 Job: [id number]

## 2011-01-17 NOTE — Procedures (Signed)
NAME:  Christopher [name], Christopher [name]              ACCOUNT NO.:  [id number]   MEDICAL RECORD NO.:  [id number]          PATIENT TYPE:  OUT   LOCATION:  SLEEP LAB                     FACILITY:  [facility]   PHYSICIAN:  Marcelyn Bruins, M.D. [facility] DATE OF BIRTH:  04/17/1956   DATE OF STUDY:  08/29/2005                              NOCTURNAL POLYSOMNOGRAM   REFERRING PHYSICIAN:  Dr. Shaune Pollack   DATE OF STUDY:  August 29, 2005   INDICATION FOR STUDY:  Hypersomnia with sleep apnea.   EPWORTH SCORE:  Five.   SLEEP ARCHITECTURE:  The patient had a total sleep time of 414 minutes and  never achieved slow wave sleep or REM. Sleep onset latency was normal at 24  minutes. Sleep efficiency was 95%.   RESPIRATORY DATA:  The patient was found to have 61 hypopneas and 418 apneas  for a Respiratory Disturbance Index of 70 events per hour. The events were  clearly worse in the supine position and loud snoring was noted throughout.   OXYGEN DATA:  The patient had O2 desaturation as low as 74% with her  obstructive events.   CARDIAC DATA:  No clinically significant cardiac arrhythmias.   MOVEMENT-PARASOMNIA:  The patient was found to have 80 leg jerks with very  small impact on sleep.   IMPRESSION-RECOMMENDATION:  Severe obstructive sleep apnea/hypopnea syndrome  with a Respiratory Disturbance Index of 70 events per hour and O2  desaturation as low as 74%. The best treatment for this degree of sleep  apnea is weight loss combined with continuous positive airway pressure,  however consideration can be given to upper airway surgery if applicable.                                            ______________________________  Marcelyn Bruins, M.D. [facility]  Diplomate, American Board of Sleep  Medicine     KC/MEDQ  D:  09/05/2005 11:24:09  T:  09/05/2005 18:39:38  Job:  [id number]

## 2011-01-17 NOTE — Op Note (Signed)
NAME:  Christopher [name], Christopher [name]                        ACCOUNT NO.:  [id number]   MEDICAL RECORD NO.:  [id number]                   PATIENT TYPE:  AMB   LOCATION:  DAY                                  FACILITY:  [facility]   PHYSICIAN:  Barbaraann Barthel, M.D.              DATE OF BIRTH:  May 14, 1956   DATE OF PROCEDURE:  02/24/2003  DATE OF DISCHARGE:                                 OPERATIVE REPORT   SURGEON:  Barbaraann Barthel, M.D.   PREOPERATIVE DIAGNOSIS:  Recurrent umbilical hernia.   POSTOPERATIVE DIAGNOSIS:  Recurrent umbilical hernia.   PROCEDURE:  Umbilical herniorrhaphy.   SPECIMENS:  Incarcerated omentum.   INDICATIONS FOR PROCEDURE:  This is a 55 year old white male who had had a  previous umbilical hernia repair at the time of laparoscopic  cholecystectomy; however, he had done a lot of coughing and had a lot of  musculoskeletal strains and pains and likely stressed that repair.  At any  rate, he had a recurrence, and we had planned for an elective repair as an  outpatient.  We discussed the procedure with him in detail, discussing  complications not limited to but including bleeding, infection, and  recurrence.  Informed consent was obtained.   GROSS OPERATIVE FINDINGS:  The patient had an umbilical hernia defect  approximately the size of a nickel with some incarcerated fat within it.  This was removed and sent as a specimen.  No other abnormalities were  encountered.   TECHNIQUE:  After the adequate administration of LMA anesthesia, his entire  abdomen was prepped with Betadine solution and draped in the usual manner.   A periumbilical incision was carried out over the superior aspect of the  umbilicus where his previous scar was.  The skin and subcutaneous tissue was  excised down to the umbilical hernia defect which was dissected free from  the umbilicus skin.  We then developed a good rim of viable fascia around  the circumference of the hernia, ligating the herniated  omentum with 2-0  silk and dividing this and sending this as a specimen.  We then closed the  defect with 0 Prolene in figure-of-eight fashion in a transverse manner.  The wound was then irrigated with normal saline solution.  The umbilicus  skin was tacked to the fascia in order to restore its normal concave  appearance.  I used approximately 9 cc of 0.5% Sensorcaine to help with  postoperative discomfort and then approximated the skin with a stapling  device.  Prior to closure, all sponge, needle, and instrument counts were  found to be correct.  Estimated blood loss was minimal.  The received  approximately a liter of crystalloid intraoperatively.  No drains were  placed.  There were no complications.  Barbaraann Barthel, M.D.    WB/MEDQ  D:  02/24/2003  T:  02/24/2003  Job:  [id number]   cc:   Ramon Dredge L. Juanetta Gosling, M.D.  [address]  [place]  [place] [address]  Fax: [phone]

## 2011-01-20 ENCOUNTER — Ambulatory Visit (INDEPENDENT_AMBULATORY_CARE_PROVIDER_SITE_OTHER): Payer: Medicare Other | Admitting: Orthopedic Surgery

## 2011-01-20 DIAGNOSIS — M1710 Unilateral primary osteoarthritis, unspecified knee: Secondary | ICD-10-CM

## 2011-01-20 DIAGNOSIS — M25469 Effusion, unspecified knee: Secondary | ICD-10-CM

## 2011-01-20 DIAGNOSIS — M23302 Other meniscus derangements, unspecified lateral meniscus, unspecified knee: Secondary | ICD-10-CM

## 2011-01-20 NOTE — Patient Instructions (Signed)
Designer, multimedia

## 2011-01-20 NOTE — Progress Notes (Signed)
Status post arthroscopy, RIGHT knee, status post repeat meniscectomy.  Postop visit #1 had severe swelling in the knee, had aspiration and aspirated bloody fluid.  Improved over the weekend.  He can now flex his knee about 85, lacks about 5-10 of extension. Most of the swelling is resolving.  Continued ice therapy, rest, crutch, and it's okay to go in the pool and continue range of motion exercises and reevaluate in one week

## 2011-01-21 NOTE — H&P (Addendum)
  NAME:  Christopher [name], CAPANO NO.:  [id number]  MEDICAL RECORD NO.:  [id number]           PATIENT TYPE:  LOCATION:                                 FACILITY:  PHYSICIAN:  Vickki Hearing, M.D.DATE OF BIRTH:  1956/02/12  DATE OF ADMISSION: DATE OF DISCHARGE:  LH                             HISTORY & PHYSICAL   CHIEF COMPLAINT:  Right knee pain.  HISTORY:  A 55 year old male status post left and right knee arthroscopies status post recent MRI of his right knee for recurrent pain and swelling status post multiple aspirations and injections for recurrent pain, presents with severe medial and anterior knee pain and difficulty negotiating stairs and bending and squatting.  He works as a Community education officer of cars and presents with difficulties with his activities of daily living.  He wishes to proceed with arthroscopic reevaluation of his right knee secondary to recurrent symptoms.  He is allergic to CODEINE, PREDNISONE, PERCOCET, DOXYCYCLINE, CO-GESIC, and he is allergic to STING OF A BEE.  He has a medical history of hypertension.  He has had lumbar spine surgery, previous knee surgeries as mentioned.  His review of systems is positive for loss of sensation in the right lower extremity.  Social history is remarkable for he is married.  He has children.  He has family history of no contributory diseases.  He is a well-developed, well-nourished, short in stature, round, rotund male, oriented x3.  Mood and affect pleasant.  Gait and station remarkable for ambulatory disturbance.  He limps on his right leg.  His right knee has medial joint line tenderness, anterior pain at the patella tendon with positive patellofemoral crepitus, and positive patellar compression test.  He still has maintained a full range of motion and his knee remains stable.  He has good strength in his quads. His skin is intact.  Pulse and temperature normal without edema.   Lymph nodes negative.  He does have some L5-L4 sensory disturbance in the right lower extremity from disk disease.  Reflexes are normal. Coordination and balance are normal.  His other extremities on inspection are normal, range of motion tests normal, stability and strength normal, skin normal, pulse and temperature normal.  No lymph nodes are positive.  Sensation is normal. Reflexes are normal.  IMPRESSION:  Osteoarthritis right knee, recurrent tear medial meniscus.  PLAN:  Arthroscopy right knee.  Informed consent was done in the office.  Risk and benefit ratio was discussed and explained.  The patient wishes to proceed with surgery on the right knee.     Vickki Hearing, M.D.     SEH/MEDQ  D:  01/09/2011  T:  01/09/2011  Job:  [id number]  Electronically Signed by Fuller Canada M.D. on 01/21/2011 12:52:46 PM

## 2011-01-21 NOTE — Op Note (Signed)
NAME:  Christopher [name], Christopher [name] NO.:  [id number]  MEDICAL RECORD NO.:  [id number]           PATIENT TYPE:  O  LOCATION:  DAYP                          FACILITY:  [facility]  PHYSICIAN:  Vickki Hearing, M.D.DATE OF BIRTH:  19-Aug-1956  DATE OF PROCEDURE:  01/13/2011 DATE OF DISCHARGE:                              OPERATIVE REPORT   HISTORY:  A 55 year old male status post right knee arthroscopy several years ago presented with pain on the medial side in the anterior portion of his knee, trouble bending and squatting with recurrent effusions clinical exam suggested medial meniscal tear, was advised to have surgery or to continue with nonoperative care.  He opted for surgical treatment.  PREOPERATIVE DIAGNOSIS:  Recurrent tear of medial meniscus with osteoarthritis.  POSTOPERATIVE DIAGNOSIS:  Recurrent tear of medial meniscus.  PROCEDURE:  Arthroscopy right knee with partial medial meniscectomy.  SURGEON:  Vickki Hearing, MD.  ASSISTANT:  No assistants.  ANESTHESIA:  Spinal.  OPERATIVE FINDINGS:  The patient had a re-tear of the posterior horn of his medial meniscus.  He had relatively normal medial, lateral, and patellofemoral joint surfaces with a small chondral flap tear near the most lateral portion of the medial femoral condyle near the notch in a nonweightbearing area.  There was also with a thickened fat pad and medial plica.  Partial medial meniscectomy was done, resection of the hypertrophic fat pad was done, resection of plica was done.  DETAILS OF PROCEDURE:  Christopher [name] was identified in the preop area. The right knee was marked as the surgical site, countersigned by the surgeon.  This corresponded with the chart update.  The patient was taken to surgery given 1 gram of Ancef.  He had a spinal anesthetic.  He was placed supine.  A pad was placed under his left leg.  His right leg was placed in arthroscopic leg holder.  His right leg was  prepped and draped sterilely.  Time-out procedure was executed and completed. Lateral portal was established with an 11 blade 15 mL of Marcaine was injected into the joint and the medial and lateral portal sites.  The scope was introduced through the lateral portal into the medial compartment.  Diagnostic arthroscopy was completed, medial portal was established with the assistance of a spinal needle.  The meniscus was probed and found to be displaceable with what appeared to be undersurface tear.  Duckbill forceps was used to resect the tear back to a stable rim.  The meniscal fragments were removed with a shaver and arthroscopic ArthroCare wand was placed to balance the meniscus.  The meniscal fragments were washed from the joint.  The joint was suctioned free.  The plica was removed with a shaver and ArthroCare wand. Bleeding was coagulated with the ArthroCare wand.  The remaining joint surfaces and lateral meniscus were normal.  Patellofemoral joint was normal.  The chondral flap on the medial femoral condyle in the nonweightbearing area near the notch was debrided.  The wound was closed with 3-0 nylon sutures.  The joint was injected with 45 mL of additional Marcaine with epinephrine.  After sterile dressings were applied  and Ace wrap was placed and a cryo cuff was placed and activated.  The patient was discharged home today with weightbearing as tolerated weightbearing status and active range of motion can start in 48 hours.     Vickki Hearing, M.D.     SEH/MEDQ  D:  01/13/2011  T:  01/13/2011  Job:  [id number]  Electronically Signed by Fuller Canada M.D. on 01/21/2011 12:52:58 PM

## 2011-01-28 ENCOUNTER — Ambulatory Visit (INDEPENDENT_AMBULATORY_CARE_PROVIDER_SITE_OTHER): Payer: Medicare Other | Admitting: Orthopedic Surgery

## 2011-01-28 DIAGNOSIS — M25469 Effusion, unspecified knee: Secondary | ICD-10-CM

## 2011-01-28 DIAGNOSIS — M23302 Other meniscus derangements, unspecified lateral meniscus, unspecified knee: Secondary | ICD-10-CM

## 2011-01-28 MED ORDER — METHYLPREDNISOLONE ACETATE 40 MG/ML IJ SUSP: 40.0000 mg | Freq: Once | INTRAMUSCULAR | Status: DC

## 2011-01-28 NOTE — Progress Notes (Signed)
Arthroscopy RIGHT knee.  Postoperative swelling secondary to hemarthrosis.  Aspirated once. Swelling is down but he still feels tight.  Aspirated again.  orange colored fluid was removed approximately 30 cc  Knee felt better.  Return one week Monday. Recheck.  Continue to ice and modified

## 2011-01-28 NOTE — Progress Notes (Signed)
Addended by: Fuller Canada, MD E on: 01/28/2011 05:19 PM   Modules accepted: Orders, Level of Service

## 2011-02-03 ENCOUNTER — Ambulatory Visit: Payer: Medicare Other | Admitting: Orthopedic Surgery

## 2011-02-04 ENCOUNTER — Ambulatory Visit (INDEPENDENT_AMBULATORY_CARE_PROVIDER_SITE_OTHER): Payer: Medicare Other | Admitting: Orthopedic Surgery

## 2011-02-04 DIAGNOSIS — IMO0002 Reserved for concepts with insufficient information to code with codable children: Secondary | ICD-10-CM

## 2011-02-04 NOTE — Progress Notes (Signed)
Right knee doing well  Flexion of 125 degrees  F/u prn

## 2011-02-27 ENCOUNTER — Ambulatory Visit (INDEPENDENT_AMBULATORY_CARE_PROVIDER_SITE_OTHER): Payer: Medicare Other | Admitting: Orthopedic Surgery

## 2011-02-27 DIAGNOSIS — M25469 Effusion, unspecified knee: Secondary | ICD-10-CM

## 2011-02-27 NOTE — Patient Instructions (Signed)
You have received a steroid shot. 15% of patients experience increased pain at the injection site with in the next 24 hours. This is best treated with ice and tylenol extra strength 2 tabs every 8 hours. If you are still having pain please call the office.    

## 2011-03-03 MED ORDER — METHYLPREDNISOLONE ACETATE 40 MG/ML IJ SUSP: 40.0000 mg | Freq: Once | INTRAMUSCULAR | Status: DC

## 2011-03-03 NOTE — Procedures (Signed)
Verbal consent was obtained   Time out completed   Under sterile conditions the right knee was injected with Depomedrol 40 mg / ml (1 ml) and lidocaine 1% (4 ml)  There were no complications

## 2011-03-03 NOTE — Progress Notes (Signed)
Status post arthroscopy, RIGHT knee, status post repeat meniscectomy. He is: Today but not blocking flexion otherwise doing well  I gave him a new cortisone shot.  Verbal consent was obtained   Time out completed   Under sterile conditions the right knee was injected with Depomedrol 40 mg / ml (1 ml) and lidocaine 1% (4 ml)  There were no complications

## 2011-04-10 ENCOUNTER — Ambulatory Visit: Payer: Medicare Other | Admitting: Orthopedic Surgery

## 2011-04-16 ENCOUNTER — Other Ambulatory Visit (HOSPITAL_COMMUNITY): Payer: Self-pay | Admitting: Pulmonary Disease

## 2011-04-16 DIAGNOSIS — I739 Peripheral vascular disease, unspecified: Secondary | ICD-10-CM

## 2011-04-18 ENCOUNTER — Ambulatory Visit (HOSPITAL_COMMUNITY)
Admission: RE | Admit: 2011-04-18 | Discharge: 2011-04-18 | Disposition: A | Discharge status: Home / Self Care | Payer: Medicare Other | Source: Ambulatory Visit | Attending: Pulmonary Disease | Admitting: Pulmonary Disease

## 2011-04-18 DIAGNOSIS — I739 Peripheral vascular disease, unspecified: Secondary | ICD-10-CM

## 2011-04-22 ENCOUNTER — Encounter (INDEPENDENT_AMBULATORY_CARE_PROVIDER_SITE_OTHER): Payer: Self-pay | Admitting: *Deleted

## 2011-05-30 LAB — BASIC METABOLIC PANEL
BUN: 10
Calcium: 9.6
Creatinine, Ser: 1
GFR calc Af Amer: >60

## 2011-05-30 LAB — CBC
HCT: 44.3
Hemoglobin: 15.3
MCHC: 34.6
MCV: 84.9
Platelets: 269
RBC: 5.21
RDW: 13.8
WBC: 8.1

## 2011-05-30 LAB — ETHANOL: Alcohol, Ethyl (B): <5

## 2011-05-30 LAB — DIFFERENTIAL
Basophils Relative: 0
Eosinophils Absolute: 0.1
Lymphs Abs: 2
Monocytes Relative: 7
Neutro Abs: 5.4
Neutrophils Relative %: 67

## 2011-05-30 LAB — RAPID URINE DRUG SCREEN, HOSP PERFORMED
Barbiturates: NOT DETECTED
Cocaine: NOT DETECTED
Opiates: NOT DETECTED
Tetrahydrocannabinol: POSITIVE — AB

## 2011-06-10 ENCOUNTER — Encounter (INDEPENDENT_AMBULATORY_CARE_PROVIDER_SITE_OTHER): Payer: Self-pay | Admitting: *Deleted

## 2011-06-10 ENCOUNTER — Ambulatory Visit (INDEPENDENT_AMBULATORY_CARE_PROVIDER_SITE_OTHER): Payer: Medicare Other | Admitting: Internal Medicine

## 2011-06-10 ENCOUNTER — Other Ambulatory Visit (INDEPENDENT_AMBULATORY_CARE_PROVIDER_SITE_OTHER): Payer: Self-pay | Admitting: *Deleted

## 2011-06-10 ENCOUNTER — Telehealth (INDEPENDENT_AMBULATORY_CARE_PROVIDER_SITE_OTHER): Payer: Self-pay | Admitting: *Deleted

## 2011-06-10 ENCOUNTER — Encounter (INDEPENDENT_AMBULATORY_CARE_PROVIDER_SITE_OTHER): Payer: Self-pay | Admitting: Internal Medicine

## 2011-06-10 VITALS — BP 112/56 | HR 72 | Temp 98.3°F | Ht 66.0 in | Wt 199.7 lb

## 2011-06-10 DIAGNOSIS — D126 Benign neoplasm of colon, unspecified: Secondary | ICD-10-CM

## 2011-06-10 DIAGNOSIS — K589 Irritable bowel syndrome without diarrhea: Secondary | ICD-10-CM

## 2011-06-10 DIAGNOSIS — R197 Diarrhea, unspecified: Secondary | ICD-10-CM

## 2011-06-10 DIAGNOSIS — Z86010 Personal history of colonic polyps: Secondary | ICD-10-CM

## 2011-06-10 MED ORDER — DICYCLOMINE HCL 10 MG PO CAPS: 10.0000 mg | ORAL_CAPSULE | Freq: Every day | ORAL | Status: DC

## 2011-06-10 NOTE — Progress Notes (Signed)
Subjective:     Patient ID: Christopher [name], male   DOB: [birth year], 55 y.o.   MRN: [id number]  HPI Referred by Dr. Juanetta [name]. He says he can eat a hotdog and then he will go to the bathroom and have a BM.He says has to run when the urge to have a BM occurs.   He is only eating one meal a day by choice. He says he is having acid reflux depending on what he eats such as spicy foods and hotdogs..  His stools are almost loose and watery for years.He is having 2 loose a day.  No abdominal pain now. His appetite is good. His wife says when he comes in the evening his appetite is very good.   He does have episodes of constipation and his stools are hard.   He is constipated about once a week.   He occasionally has black stools when he is constipated. He tells me he tries to  avoid spicy foods.  No bright red rectal bleeding. No NSAIDs.  1117/2005 EGD/Colonoscopy:  INDICATIONS:  Several-year  history of heartburn, whose symptoms have not responded to various PPIs. He  has been on Aciphex for a few months, which did not help. He therefore  stopped the medication. He has previously been tried on Prilosec, which did  not work, and he developed abdominal pain with Nexium and therefore  discontinued it. He also has a chronic diarrhea and therefore undergoing  EGD.  Results: 1. No evidence of erosive or ulcerative esophagitis or Barrett's esophagus.  2. Small sliding hiatal hernia.  3. Normal examination of the stomach, first and second part of the  duodenum.  4. Normal terminal ileoscopy.  5. Two tiny polyps ablated via cold biopsy from transverse colon. No  evidence of colitis.  6. Small external hemorrhoids.  7. Suspect he has irritable bowel syndrome and uncomplicated  gastroesophageal reflux disease.   Review of Systems see hpi Current Outpatient Prescriptions  Medication Sig Dispense Refill  . ALPRAZolam (XANAX PO) Take 0.25 mg by mouth 3 (three) times daily.       . cyclobenzaprine (FLEXERIL) 10  MG tablet 10 mg 3 (three) times daily.       . pantoprazole (PROTONIX) 40 MG tablet 40 mg daily.       . QUETIAPINE FUMARATE PO daily.       . SEROQUEL 50 MG tablet at bedtime.       . sertraline (ZOLOFT) 50 MG tablet       . dicyclomine (BENTYL) 10 MG capsule Take 1 capsule (10 mg total) by mouth daily.  30 capsule  2  . diphenhydrAMINE (BENADRYL) 25 mg capsule Take 1 capsule (25 mg total) by mouth every 4 (four) hours as needed for itching.  60 capsule  2  . HYDROcodone-acetaminophen (NORCO) 10-325 MG per tablet       . methylPREDNISolone acetate (DEPO-MEDROL) 40 MG/ML injection Inject 1 mL (40 mg total) into the articular space once.  1 mL  0   Current Facility-Administered Medications  Medication Dose Route Frequency Provider Last Rate Last Dose  . methylPREDNISolone acetate (DEPO-MEDROL) injection 40 mg  40 mg Intra-articular Once Fuller Canada, MD       Past Surgical History  Procedure Date  . Knee arthroscopy     left knee  . Knee arthroscopy     right knee   . Spinal fusion     x 2  . Neck fusion    Past Medical  History  Diagnosis Date  . Hyperlipidemia    History   Social History  . Marital Status: Married    Spouse Name: N/A    Number of Children: N/A  . Years of Education: N/A   Occupational History  . car body repair    Social History Main Topics  . Smoking status: Current Everyday Smoker -- 1.0 packs/day    Types: Cigarettes  . Smokeless tobacco: Not on file   Comment: 1 pack a day  . Alcohol Use: No  . Drug Use: No  . Sexually Active: Not on file   Other Topics Concern  . Not on file   Social History Narrative  . No narrative on file   .soc History   Social History  . Marital Status: Married    Spouse Name: N/A    Number of Children: N/A  . Years of Education: N/A   Occupational History  . car body repair    Social History Main Topics  . Smoking status: Current Everyday Smoker -- 1.0 packs/day    Types: Cigarettes  . Smokeless  tobacco: Not on file   Comment: 1 pack a day  . Alcohol Use: No  . Drug Use: No  . Sexually Active: Not on file   Other Topics Concern  . Not on file   Social History Narrative  . No narrative on file      Objective:   Physical Exam  Filed Vitals:   06/10/11 1458  BP: 112/56  Pulse: 72  Temp: 98.3 F (36.8 C)  Height: 5\' 6"  (1.676 m)  Weight: 199 lb 11.2 oz (90.583 kg)     . Sclera anicteric, conjunctivae is pink. Thyroid not enlarged. No cervical lymphadenopathy. Lungs clear. Heart regular rate and rhythm.  Abdomen is soft. Bowel sounds are positive. Stool brown and guaiac negative.No hepatomegaly. No abdominal masses felt. No tenderness.  No edema to lower extremities. Patient is alert and oriented.     Assessment:     Christopher [name] is a 55 yr old male with  IBS.   He also has adenomatous polypoid fragments. I discussed this case with Dr. Karilyn [name].  Will  start him on Bentyl 10mg  po daily for his IBS. Stool softner daily.       Plan:    Will schedule a colonoscopy with  Dr. Karilyn [name]. The risks and benefits such as perforation, bleeding, and infection were reviewed with the patient and is agreeable. Patient and wife state they are satisfied with the care they received today.

## 2011-06-10 NOTE — Telephone Encounter (Signed)
Patient need movi prep 

## 2011-06-11 MED ORDER — PEG-KCL-NACL-NASULF-NA ASC-C 100 G PO SOLR: 1.0000 | Freq: Once | ORAL | Status: DC

## 2011-06-26 MED ORDER — SODIUM CHLORIDE 0.45 % IV SOLN
Freq: Once | INTRAVENOUS | Status: AC
  Administered 2011-06-27: 1000 mL via INTRAVENOUS

## 2011-06-27 ENCOUNTER — Encounter (HOSPITAL_COMMUNITY)
Admission: RE | Disposition: A | Discharge status: Home / Self Care | Payer: Self-pay | Source: Ambulatory Visit | Attending: Internal Medicine

## 2011-06-27 ENCOUNTER — Encounter (HOSPITAL_COMMUNITY): Payer: Self-pay | Admitting: *Deleted

## 2011-06-27 ENCOUNTER — Other Ambulatory Visit (INDEPENDENT_AMBULATORY_CARE_PROVIDER_SITE_OTHER): Payer: Self-pay | Admitting: Internal Medicine

## 2011-06-27 ENCOUNTER — Ambulatory Visit (HOSPITAL_COMMUNITY)
Admission: RE | Admit: 2011-06-27 | Discharge: 2011-06-27 | Disposition: A | Discharge status: Home / Self Care | Payer: Medicare Other | Source: Ambulatory Visit | Attending: Internal Medicine | Admitting: Internal Medicine

## 2011-06-27 DIAGNOSIS — Z860100 Personal history of colon polyps, unspecified: Secondary | ICD-10-CM | POA: Insufficient documentation

## 2011-06-27 DIAGNOSIS — R197 Diarrhea, unspecified: Secondary | ICD-10-CM

## 2011-06-27 DIAGNOSIS — K589 Irritable bowel syndrome without diarrhea: Secondary | ICD-10-CM

## 2011-06-27 DIAGNOSIS — R109 Unspecified abdominal pain: Secondary | ICD-10-CM | POA: Insufficient documentation

## 2011-06-27 DIAGNOSIS — E785 Hyperlipidemia, unspecified: Secondary | ICD-10-CM | POA: Insufficient documentation

## 2011-06-27 DIAGNOSIS — D126 Benign neoplasm of colon, unspecified: Secondary | ICD-10-CM | POA: Insufficient documentation

## 2011-06-27 HISTORY — PX: COLONOSCOPY: SHX5424

## 2011-06-27 SURGERY — COLONOSCOPY
Anesthesia: Moderate Sedation

## 2011-06-27 MED ORDER — MEPERIDINE HCL 50 MG/ML IJ SOLN
INTRAMUSCULAR | Status: DC | PRN
  Administered 2011-06-27 (×2): 25 mg via INTRAVENOUS

## 2011-06-27 MED ORDER — MIDAZOLAM HCL 5 MG/5ML IJ SOLN
INTRAMUSCULAR | Status: AC
  Filled 2011-06-27: qty 10

## 2011-06-27 MED ORDER — MIDAZOLAM HCL 5 MG/5ML IJ SOLN
INTRAMUSCULAR | Status: AC
  Filled 2011-06-27: qty 5

## 2011-06-27 MED ORDER — MIDAZOLAM HCL 5 MG/5ML IJ SOLN
INTRAMUSCULAR | Status: DC | PRN
  Administered 2011-06-27: 2 mg via INTRAVENOUS
  Administered 2011-06-27: 3 mg via INTRAVENOUS
  Administered 2011-06-27: 2 mg via INTRAVENOUS
  Administered 2011-06-27: 3 mg via INTRAVENOUS

## 2011-06-27 MED ORDER — MEPERIDINE HCL 50 MG/ML IJ SOLN
INTRAMUSCULAR | Status: AC
  Filled 2011-06-27: qty 1

## 2011-06-27 NOTE — Op Note (Signed)
COLONOSCOPY PROCEDURE REPORT  PATIENT:  Christopher [name]  MR#:  [id number] Birthdate:  Aug 25, 1956, 55 y.o., male Endoscopist:  Dr. Malissa Hippo, MD Referred By:  Dr. Oneal Deputy. Juanetta Gosling M.D. Procedure Date: 06/27/2011  Procedure:   Colonoscopy  Indications:  Patient is 55 year old Caucasian male with recurrent diarrhea and abdominal cramps was felt to have IBS but hasn't responded to therapy. His last colonoscopy was in November 2005 with removal of single polyp which was an adenoma. He is undergoing  diagnostic/surveillance colonoscopy  Informed Consent: Procedure and risks were reviewed with the patient and informed consent was obtained. Medications:  Demerol 50 mg IV Versed 12 mg IV  Description of procedure:  After a digital rectal exam was performed, that colonoscope was advanced from the anus through the rectum and colon to the area of the cecum, ileocecal valve and appendiceal orifice. The cecum was deeply intubated. These structures were well-seen and photographed for the record. From the level of the cecum and ileocecal valve, the scope was slowly and cautiously withdrawn. The mucosal surfaces were carefully surveyed utilizing scope tip to flexion to facilitate fold flattening as needed. The scope was pulled down into the rectum where a thorough exam including retroflexion was performed.  Findings:   Prep was fair with the pieces of formed stool in descending and transverse colon. 2 small polyps at ascending colon later via cold biopsy and submitted in one container. Another small polyp in mid transverse colon which is cold snared. Pseudopolyp was ablated via cold biopsy. One of the pieces was lost. Rest of the mucosa was normal. No diverticula noted. Normal anorectal junction.  Therapeutic/Diagnostic Maneuvers Performed:  See above  Complications:  None  Cecal Withdrawal Time:  18 minutes  Impression:  Examination performed to cecum. Quality of examination somewhat  compromised because of the prep. 3 small polyps removed; 2 from ascending colon via cold biopsy and submitted in one container; third one from transverse colon was cold snared and biopsied. Suspect  chronic diarrhea is due to IBS.  Recommendations:  High-fiber diet. She advised to eat 3 meals a day rather than one large meal. Continue  dicyclomine 10 mg 30 minutes before each meal. I will be contacting patient with biopsy results the  REHMAN,NAJEEB U  06/27/2011 11:55 AM  CC: Dr. Fredirick Maudlin, MD & Dr. Bonnetta Barry ref. provider found

## 2011-06-27 NOTE — Progress Notes (Signed)
Column created in error.  Time set is a typo.

## 2011-06-27 NOTE — H&P (Signed)
This is an update to history and physical from the office done earlier this month. Is noted there was done at her on an other patient. He is undergoing diagnostic/surveillance colonoscopy. there has been no change in his condition or medications and she was seen in the office

## 2011-07-03 ENCOUNTER — Encounter (HOSPITAL_COMMUNITY): Payer: Self-pay | Admitting: Internal Medicine

## 2011-07-03 ENCOUNTER — Other Ambulatory Visit (INDEPENDENT_AMBULATORY_CARE_PROVIDER_SITE_OTHER): Payer: Self-pay | Admitting: Internal Medicine

## 2011-07-03 DIAGNOSIS — R197 Diarrhea, unspecified: Secondary | ICD-10-CM

## 2011-07-03 MED ORDER — CHOLESTYRAMINE 4 G PO PACK: 1.0000 | PACK | Freq: Two times a day (BID) | ORAL | Status: DC

## 2011-07-04 ENCOUNTER — Encounter (INDEPENDENT_AMBULATORY_CARE_PROVIDER_SITE_OTHER): Payer: Self-pay | Admitting: *Deleted

## 2011-07-09 ENCOUNTER — Ambulatory Visit: Payer: Medicare Other | Admitting: Gastroenterology

## 2011-07-10 ENCOUNTER — Encounter (INDEPENDENT_AMBULATORY_CARE_PROVIDER_SITE_OTHER): Payer: Self-pay | Admitting: *Deleted

## 2011-07-17 ENCOUNTER — Telehealth (INDEPENDENT_AMBULATORY_CARE_PROVIDER_SITE_OTHER): Payer: Self-pay | Admitting: Internal Medicine

## 2011-07-17 NOTE — Telephone Encounter (Signed)
Wife said the Bentyl is working really good. He is going the the bathroom very good and it is normal. Would like to cx his next apt to save on his co-pay and be PRN. Would this be okay?  How often should he have his tcs? Please return the call to [phone].

## 2011-07-18 NOTE — Telephone Encounter (Signed)
Message left at home for him to return call.

## 2011-07-28 ENCOUNTER — Ambulatory Visit (INDEPENDENT_AMBULATORY_CARE_PROVIDER_SITE_OTHER): Payer: Medicare Other | Admitting: Internal Medicine

## 2011-07-30 ENCOUNTER — Ambulatory Visit (INDEPENDENT_AMBULATORY_CARE_PROVIDER_SITE_OTHER): Payer: Medicare Other | Admitting: Orthopedic Surgery

## 2011-07-30 ENCOUNTER — Encounter: Payer: Self-pay | Admitting: Orthopedic Surgery

## 2011-07-30 VITALS — BP 130/80 | Ht 66.0 in | Wt 246.0 lb

## 2011-07-30 DIAGNOSIS — S83209A Unspecified tear of unspecified meniscus, current injury, unspecified knee, initial encounter: Secondary | ICD-10-CM | POA: Insufficient documentation

## 2011-07-30 DIAGNOSIS — IMO0002 Reserved for concepts with insufficient information to code with codable children: Secondary | ICD-10-CM

## 2011-07-30 DIAGNOSIS — M23302 Other meniscus derangements, unspecified lateral meniscus, unspecified knee: Secondary | ICD-10-CM

## 2011-07-30 DIAGNOSIS — M23209 Derangement of unspecified meniscus due to old tear or injury, unspecified knee: Secondary | ICD-10-CM | POA: Insufficient documentation

## 2011-07-30 NOTE — Patient Instructions (Signed)
Surgery Dec 14 th

## 2011-07-30 NOTE — Progress Notes (Signed)
Chief complaint: Bilateral knee pain HPI:(22) A 55 year old male comes in with swelling in the RIGHT knee and pain catching, locking, and dysfunction of the LEFT knee previous LEFT knee scope. The patient feels like there is grinding and bone. The bone rubbing in the LEFT knee. He is having trouble getting up from a squatting position.  ROS:(2) Back is stable, no neurologic symptoms.  PFSH: (1)  Past Medical History  Diagnosis Date  . Hyperlipidemia    Past Surgical History  Procedure Date  . Knee arthroscopy     left knee  . Knee arthroscopy     right knee   . Spinal fusion     x 2  . Neck fusion   . Back surgery   . Colonoscopy 06/27/2011    Procedure: COLONOSCOPY;  Surgeon: Malissa Hippo, MD;  Location: [facility];  Service: Endoscopy;  Laterality: N/A;  9:00 / Pt to be here at 9am for 10:45 procedure     Physical Exam(12) GENERAL: normal development   CDV: pulses are normal   Skin: normal  Lymph: nodes were not palpable/normal  Psychiatric: awake, alert and oriented  Neuro: normal sensation  MSK Ambulation with a noticeable limp. 1 RIGHT knee large effusion. Flexion remains 130. Mild medial joint line tenderness. Knee is stable. Muscle tone is normal. Meniscal signs are negative. 2 LEFT knee crepitance on range of motion, which is full. No effusion. Medial joint line tenderness. Positive McMurray sign. Knee is stable. Muscle tone normal. Strength normal. 3 Upper extremity exam  Inspection and palpation revealed no abnormalities in the upper extremities.  Range of motion is full without contracture.  Motor exam is normal with grade 5 strength.  The joints are fully reduced without subluxation.  There is no atrophy or tremor and muscle tone is normal.  All joints are stable.  Imaging: Previous knee films show that he has varus alignment to the LEFT knee with joint space narrowing medially. Moderate to severe  Assessment: Probable re\re tear, medial meniscus  with arthritis    Plan: Arthroscopy LEFT knee, partial medial meniscectomy.  Procedure note for RIGHT knee aspiration noted below  Knee Arthrocentesis with Injection Procedure Note  Pre-operative Diagnosis: right knee oa   Post-operative Diagnosis: same  Indications: Symptomatic relief of large effusion  Anesthesia: ethyl chloride    Procedure Details   Verbal consent was obtained for the procedure. The joint was prepped with Betadine and a small wheel of anesthetic was injected into the subcutaneous tissue. A 22 gauge needle was inserted into the superior aspect of the joint from a lateral approach. 30 ml of clear yellow fluid was removed from the joint and discarded. 3 ml 1% lidocaine and 40 mg of depomendrol was then injected into the joint through the same needle. The needle was removed and the area cleansed and dressed.  Complications:  None; patient tolerated the procedure well.

## 2011-08-04 ENCOUNTER — Encounter (HOSPITAL_COMMUNITY): Payer: Self-pay | Admitting: Pharmacy Technician

## 2011-08-08 ENCOUNTER — Other Ambulatory Visit: Payer: Self-pay | Admitting: *Deleted

## 2011-08-11 ENCOUNTER — Encounter (HOSPITAL_COMMUNITY): Payer: Self-pay

## 2011-08-11 ENCOUNTER — Encounter (HOSPITAL_COMMUNITY)
Admission: RE | Admit: 2011-08-11 | Discharge: 2011-08-11 | Disposition: A | Discharge status: Home / Self Care | Payer: Medicare Other | Source: Ambulatory Visit | Attending: Orthopedic Surgery | Admitting: Orthopedic Surgery

## 2011-08-11 HISTORY — DX: Adverse effect of unspecified anesthetic, initial encounter: T41.45XA

## 2011-08-11 HISTORY — DX: Depression, unspecified: F32.A

## 2011-08-11 HISTORY — DX: Sleep apnea, unspecified: G47.30

## 2011-08-11 HISTORY — DX: Gastro-esophageal reflux disease without esophagitis: K21.9

## 2011-08-11 HISTORY — DX: Anxiety disorder, unspecified: F41.9

## 2011-08-11 HISTORY — DX: Other complications of anesthesia, initial encounter: T88.59XA

## 2011-08-11 HISTORY — DX: Major depressive disorder, single episode, unspecified: F32.9

## 2011-08-11 LAB — CBC
MCH: 29.3 pg (ref 26.0–34.0)
MCV: 86.1 fL (ref 78.0–100.0)
Platelets: 246 10*3/uL (ref 150–400)
RBC: 5.33 MIL/uL (ref 4.22–5.81)

## 2011-08-11 LAB — SURGICAL PCR SCREEN: MRSA, PCR: POSITIVE — AB

## 2011-08-11 LAB — BASIC METABOLIC PANEL
BUN: 16 mg/dL (ref 6–23)
CO2: 28 mEq/L (ref 19–32)
Calcium: 10.2 mg/dL (ref 8.4–10.5)
Creatinine, Ser: 0.98 mg/dL (ref 0.50–1.35)

## 2011-08-11 NOTE — Patient Instructions (Signed)
20 Christopher [name]  08/11/2011   Your procedure is scheduled on:  08/15/2011  Report to [facility] at 07:15 AM.  Call this number if you have problems the morning of surgery: [phone]   Remember:   Do not eat food:After Midnight.  May have clear liquids:until Midnight .  Clear liquids include soda, tea, black coffee, apple or grape juice, broth.  Take these medicines the morning of surgery with A SIP OF WATER: Xanax, Flexeril, Bentyl, Protonix, and Sertraline.   Do not wear jewelry, make-up or nail polish.  Do not wear lotions, powders, or perfumes. You may wear deodorant.  Do not shave 48 hours prior to surgery.  Do not bring valuables to the hospital.  Contacts, dentures or bridgework may not be worn into surgery.  Leave suitcase in the car. After surgery it may be brought to your room.  For patients admitted to the hospital, checkout time is 11:00 AM the day of discharge.   Patients discharged the day of surgery will not be allowed to drive home.  Name and phone number of your driver:   Special Instructions: CHG Shower Use Special Wash: 1/2 bottle night before surgery and 1/2 bottle morning of surgery.   Please read over the following fact sheets that you were given: Pain Booklet, MRSA Information, Surgical Site Infection Prevention, Anesthesia Post-op Instructions and Care and Recovery After Surgery     Arthroscopic Procedure, Knee An arthroscopic procedure can find what is wrong with your knee. PROCEDURE Arthroscopy is a surgical technique that allows your orthopedic surgeon to diagnose and treat your knee injury with accuracy. They will look into your knee through a small instrument. This is almost like a small (pencil sized) telescope. Because arthroscopy affects your knee less than open knee surgery, you can anticipate a more rapid recovery. Taking an active role by following your caregiver's instructions will help with rapid and complete recovery. Use crutches, rest,  elevation, ice, and knee exercises as instructed. The length of recovery depends on various factors including type of injury, age, physical condition, medical conditions, and your rehabilitation. Your knee is the joint between the large bones (femur and tibia) in your leg. Cartilage covers these bone ends which are smooth and slippery and allow your knee to bend and move smoothly. Two menisci, thick, semi-lunar shaped pads of cartilage which form a rim inside the joint, help absorb shock and stabilize your knee. Ligaments bind the bones together and support your knee joint. Muscles move the joint, help support your knee, and take stress off the joint itself. Because of this all programs and physical therapy to rehabilitate an injured or repaired knee require rebuilding and strengthening your muscles. AFTER THE PROCEDURE  After the procedure, you will be moved to a recovery area until most of the effects of the medication have worn off. Your caregiver will discuss the test results with you.   Only take over-the-counter or prescription medicines for pain, discomfort, or fever as directed by your caregiver.  SEEK MEDICAL CARE IF:   You have increased bleeding from your wounds.   You see redness, swelling, or have increasing pain in your wounds.   You have pus coming from your wound.   You have an oral temperature above 102 F (38.9 C).   You notice a bad smell coming from the wound or dressing.   You have severe pain with any motion of your knee.  SEEK IMMEDIATE MEDICAL CARE IF:   You develop a rash.  You have difficulty breathing.   You have any allergic problems.  Document Released: 08/15/2000 Document Revised: 04/30/2011 Document Reviewed: 03/08/2008 [facility] Patient Information 2012 [place], [place].   PATIENT INSTRUCTIONS POST-ANESTHESIA  IMMEDIATELY FOLLOWING SURGERY:  Do not drive or operate machinery for the first twenty four hours after surgery.  Do not make any important  decisions for twenty four hours after surgery or while taking narcotic pain medications or sedatives.  If you develop intractable nausea and vomiting or a severe headache please notify your doctor immediately.  FOLLOW-UP:  Please make an appointment with your surgeon as instructed. You do not need to follow up with anesthesia unless specifically instructed to do so.  WOUND CARE INSTRUCTIONS (if applicable):  Keep a dry clean dressing on the anesthesia/puncture wound site if there is drainage.  Once the wound has quit draining you may leave it open to air.  Generally you should leave the bandage intact for twenty four hours unless there is drainage.  If the epidural site drains for more than 36-48 hours please call the anesthesia department.  QUESTIONS?:  Please feel free to call your physician or the hospital operator if you have any questions, and they will be happy to assist you.     [facility] Anesthesia Department [address] [place] [phone]

## 2011-08-14 ENCOUNTER — Telehealth: Payer: Self-pay | Admitting: Orthopedic Surgery

## 2011-08-14 NOTE — Telephone Encounter (Addendum)
Contacted insurer [employer]  Ph [phone], rE out-patient surgery scheduled at [facility] 08/15/11.  CPT [id number], [id number]. Msg left voice response system.  Per online verification system and per call back, insurer: no pre-authorization is required for out-patient procedures for [employer], per [employer].

## 2011-08-15 ENCOUNTER — Encounter (HOSPITAL_COMMUNITY): Payer: Self-pay | Admitting: Anesthesiology

## 2011-08-15 ENCOUNTER — Ambulatory Visit (HOSPITAL_COMMUNITY)
Admission: RE | Admit: 2011-08-15 | Discharge: 2011-08-15 | Disposition: A | Discharge status: Home / Self Care | Payer: Medicare Other | Source: Ambulatory Visit | Attending: Orthopedic Surgery | Admitting: Orthopedic Surgery

## 2011-08-15 ENCOUNTER — Encounter (HOSPITAL_COMMUNITY): Payer: Self-pay | Admitting: *Deleted

## 2011-08-15 ENCOUNTER — Ambulatory Visit (HOSPITAL_COMMUNITY): Payer: Medicare Other | Admitting: Anesthesiology

## 2011-08-15 ENCOUNTER — Encounter (HOSPITAL_COMMUNITY)
Admission: RE | Disposition: A | Discharge status: Home / Self Care | Payer: Self-pay | Source: Ambulatory Visit | Attending: Orthopedic Surgery

## 2011-08-15 DIAGNOSIS — M23209 Derangement of unspecified meniscus due to old tear or injury, unspecified knee: Secondary | ICD-10-CM

## 2011-08-15 DIAGNOSIS — M23359 Other meniscus derangements, posterior horn of lateral meniscus, unspecified knee: Secondary | ICD-10-CM | POA: Insufficient documentation

## 2011-08-15 DIAGNOSIS — M1710 Unilateral primary osteoarthritis, unspecified knee: Secondary | ICD-10-CM | POA: Insufficient documentation

## 2011-08-15 DIAGNOSIS — M23302 Other meniscus derangements, unspecified lateral meniscus, unspecified knee: Secondary | ICD-10-CM

## 2011-08-15 HISTORY — PX: CHONDROPLASTY: SHX5177

## 2011-08-15 SURGERY — ARTHROSCOPY, KNEE, WITH MEDIAL MENISCECTOMY
Anesthesia: General | Site: Knee | Laterality: Left | Wound class: Clean

## 2011-08-15 MED ORDER — SUCCINYLCHOLINE CHLORIDE 20 MG/ML IJ SOLN
INTRAMUSCULAR | Status: AC
  Filled 2011-08-15: qty 1

## 2011-08-15 MED ORDER — SODIUM CHLORIDE 0.9 % IR SOLN
Status: DC | PRN
  Administered 2011-08-15: 1000 mL

## 2011-08-15 MED ORDER — FENTANYL CITRATE 0.05 MG/ML IJ SOLN
INTRAMUSCULAR | Status: AC
  Filled 2011-08-15: qty 2

## 2011-08-15 MED ORDER — GLYCOPYRROLATE 0.2 MG/ML IJ SOLN
0.2000 mg | Freq: Once | INTRAMUSCULAR | Status: AC | PRN
  Administered 2011-08-15: 0.2 mg via INTRAVENOUS

## 2011-08-15 MED ORDER — HYDROMORPHONE HCL 2 MG PO TABS
4.0000 mg | ORAL_TABLET | Freq: Once | ORAL | Status: AC
  Administered 2011-08-15: 2 mg via ORAL

## 2011-08-15 MED ORDER — EPINEPHRINE HCL 1 MG/ML IJ SOLN
INTRAMUSCULAR | Status: AC
  Filled 2011-08-15: qty 8

## 2011-08-15 MED ORDER — LACTATED RINGERS IV SOLN
INTRAVENOUS | Status: DC | PRN
  Administered 2011-08-15: 08:00:00 via INTRAVENOUS

## 2011-08-15 MED ORDER — MIDAZOLAM HCL 2 MG/2ML IJ SOLN
1.0000 mg | INTRAMUSCULAR | Status: DC | PRN
  Administered 2011-08-15: 2 mg via INTRAVENOUS

## 2011-08-15 MED ORDER — ONDANSETRON HCL 4 MG/2ML IJ SOLN
INTRAMUSCULAR | Status: AC
  Administered 2011-08-15: 4 mg via INTRAVENOUS
  Filled 2011-08-15: qty 2

## 2011-08-15 MED ORDER — BUPIVACAINE-EPINEPHRINE PF 0.5-1:200000 % IJ SOLN
INTRAMUSCULAR | Status: AC
  Filled 2011-08-15: qty 20

## 2011-08-15 MED ORDER — KETOROLAC TROMETHAMINE 30 MG/ML IJ SOLN
INTRAMUSCULAR | Status: AC
  Administered 2011-08-15: 30 mg via INTRAVENOUS
  Filled 2011-08-15: qty 1

## 2011-08-15 MED ORDER — PROPOFOL 10 MG/ML IV EMUL
INTRAVENOUS | Status: AC
  Filled 2011-08-15: qty 20

## 2011-08-15 MED ORDER — CEFAZOLIN SODIUM 1-5 GM-% IV SOLN: 1.0000 g | INTRAVENOUS | Status: DC

## 2011-08-15 MED ORDER — CEFAZOLIN SODIUM 1-5 GM-% IV SOLN
INTRAVENOUS | Status: AC
  Filled 2011-08-15: qty 50

## 2011-08-15 MED ORDER — KETOROLAC TROMETHAMINE 30 MG/ML IJ SOLN
30.0000 mg | Freq: Once | INTRAMUSCULAR | Status: AC
  Administered 2011-08-15: 30 mg via INTRAVENOUS

## 2011-08-15 MED ORDER — SODIUM CHLORIDE 0.9 % IR SOLN
Status: DC | PRN
  Administered 2011-08-15: 09:00:00

## 2011-08-15 MED ORDER — HYDROMORPHONE HCL 2 MG PO TABS
ORAL_TABLET | ORAL | Status: AC
  Administered 2011-08-15: 2 mg via ORAL
  Filled 2011-08-15: qty 2

## 2011-08-15 MED ORDER — ONDANSETRON HCL 4 MG/2ML IJ SOLN: 4.0000 mg | Freq: Once | INTRAMUSCULAR | Status: DC | PRN

## 2011-08-15 MED ORDER — PROPOFOL 10 MG/ML IV EMUL
INTRAVENOUS | Status: DC | PRN
  Administered 2011-08-15: 150 mg via INTRAVENOUS

## 2011-08-15 MED ORDER — FENTANYL CITRATE 0.05 MG/ML IJ SOLN: 25.0000 ug | INTRAMUSCULAR | Status: DC | PRN

## 2011-08-15 MED ORDER — LIDOCAINE HCL (PF) 1 % IJ SOLN
INTRAMUSCULAR | Status: AC
  Filled 2011-08-15: qty 5

## 2011-08-15 MED ORDER — CEFAZOLIN SODIUM 1-5 GM-% IV SOLN
INTRAVENOUS | Status: DC | PRN
  Administered 2011-08-15: 1 g via INTRAVENOUS

## 2011-08-15 MED ORDER — ACETAMINOPHEN 10 MG/ML IV SOLN
1000.0000 mg | Freq: Four times a day (QID) | INTRAVENOUS | Status: DC
  Administered 2011-08-15: 1000 mg via INTRAVENOUS

## 2011-08-15 MED ORDER — SUCCINYLCHOLINE CHLORIDE 20 MG/ML IJ SOLN
INTRAMUSCULAR | Status: DC | PRN
  Administered 2011-08-15: 120 mg via INTRAVENOUS

## 2011-08-15 MED ORDER — ACETAMINOPHEN 10 MG/ML IV SOLN
INTRAVENOUS | Status: AC
  Administered 2011-08-15: 1000 mg via INTRAVENOUS
  Filled 2011-08-15: qty 100

## 2011-08-15 MED ORDER — LACTATED RINGERS IV SOLN
INTRAVENOUS | Status: DC
  Administered 2011-08-15: 08:00:00 via INTRAVENOUS

## 2011-08-15 MED ORDER — MIDAZOLAM HCL 2 MG/2ML IJ SOLN
INTRAMUSCULAR | Status: AC
  Administered 2011-08-15: 2 mg via INTRAVENOUS
  Filled 2011-08-15: qty 2

## 2011-08-15 MED ORDER — BUPIVACAINE-EPINEPHRINE PF 0.5-1:200000 % IJ SOLN
INTRAMUSCULAR | Status: DC | PRN
  Administered 2011-08-15: 60 mL

## 2011-08-15 MED ORDER — HYDROMORPHONE HCL 4 MG PO TABS: 4.0000 mg | ORAL_TABLET | ORAL | Status: DC | PRN

## 2011-08-15 MED ORDER — FENTANYL CITRATE 0.05 MG/ML IJ SOLN
INTRAMUSCULAR | Status: DC | PRN
  Administered 2011-08-15 (×2): 100 ug via INTRAVENOUS

## 2011-08-15 MED ORDER — ONDANSETRON HCL 4 MG/2ML IJ SOLN
4.0000 mg | Freq: Once | INTRAMUSCULAR | Status: AC
  Administered 2011-08-15: 4 mg via INTRAVENOUS

## 2011-08-15 MED ORDER — GLYCOPYRROLATE 0.2 MG/ML IJ SOLN
INTRAMUSCULAR | Status: AC
  Administered 2011-08-15: 0.2 mg via INTRAVENOUS
  Filled 2011-08-15: qty 1

## 2011-08-15 MED ORDER — ACETAMINOPHEN 325 MG PO TABS: 325.0000 mg | ORAL_TABLET | ORAL | Status: DC | PRN

## 2011-08-15 SURGICAL SUPPLY — 53 items
ARTHROWAND PARAGON T2 (SURGICAL WAND)
BAG HAMPER (MISCELLANEOUS) ×2 IMPLANT
BANDAGE ELASTIC 6 VELCRO NS (GAUZE/BANDAGES/DRESSINGS) ×2 IMPLANT
BLADE AGGRESSIVE PLUS 4.0 (BLADE) ×2 IMPLANT
BLADE SURG SZ11 CARB STEEL (BLADE) ×2 IMPLANT
CHLORAPREP W/TINT 26ML (MISCELLANEOUS) ×2 IMPLANT
CLOTH BEACON ORANGE TIMEOUT ST (SAFETY) ×2 IMPLANT
COOLER CRYO IC GRAV AND TUBE (ORTHOPEDIC SUPPLIES) ×2 IMPLANT
CUFF CRYO KNEE LG 20X31 COOLER (ORTHOPEDIC SUPPLIES) IMPLANT
CUFF CRYO KNEE18X23 MED (MISCELLANEOUS) ×2 IMPLANT
CUFF TOURNIQUET SINGLE 34IN LL (TOURNIQUET CUFF) ×2 IMPLANT
CUFF TOURNIQUET SINGLE 44IN (TOURNIQUET CUFF) IMPLANT
CUTTER ANGLED DBL BITE 4.5 (BURR) IMPLANT
DECANTER SPIKE VIAL GLASS SM (MISCELLANEOUS) ×4 IMPLANT
FLOOR PAD 36X40 (MISCELLANEOUS) ×2
GAUZE SPONGE 4X4 12PLY STRL LF (GAUZE/BANDAGES/DRESSINGS) ×2 IMPLANT
GAUZE SPONGE 4X4 16PLY XRAY LF (GAUZE/BANDAGES/DRESSINGS) ×2 IMPLANT
GAUZE XEROFORM 5X9 LF (GAUZE/BANDAGES/DRESSINGS) ×2 IMPLANT
GLOVE BIOGEL PI IND STRL 7.0 (GLOVE) ×1 IMPLANT
GLOVE BIOGEL PI INDICATOR 7.0 (GLOVE) ×1
GLOVE ECLIPSE 8.5 STRL (GLOVE) ×2 IMPLANT
GLOVE SKINSENSE NS SZ8.0 LF (GLOVE) ×1
GLOVE SKINSENSE STRL SZ8.0 LF (GLOVE) ×1 IMPLANT
GLOVE SS N UNI LF 8.5 STRL (GLOVE) ×2 IMPLANT
GOWN STRL REIN XL XLG (GOWN DISPOSABLE) ×6 IMPLANT
HLDR LEG FOAM (MISCELLANEOUS) ×1 IMPLANT
IV NS IRRIG 3000ML ARTHROMATIC (IV SOLUTION) ×8 IMPLANT
KIT BLADEGUARD II DBL (SET/KITS/TRAYS/PACK) ×2 IMPLANT
KIT ROOM TURNOVER AP CYSTO (KITS) ×2 IMPLANT
LEG HOLDER FOAM (MISCELLANEOUS) ×1
MANIFOLD NEPTUNE II (INSTRUMENTS) ×2 IMPLANT
MARKER SKIN DUAL TIP RULER LAB (MISCELLANEOUS) ×2 IMPLANT
NEEDLE HYPO 18GX1.5 BLUNT FILL (NEEDLE) ×2 IMPLANT
NEEDLE HYPO 21X1.5 SAFETY (NEEDLE) ×2 IMPLANT
NEEDLE SPNL 18GX3.5 QUINCKE PK (NEEDLE) ×2 IMPLANT
NS IRRIG 1000ML POUR BTL (IV SOLUTION) ×2 IMPLANT
PACK ARTHRO LIMB DRAPE STRL (MISCELLANEOUS) ×2 IMPLANT
PAD ABD 5X9 TENDERSORB (GAUZE/BANDAGES/DRESSINGS) ×2 IMPLANT
PAD ARMBOARD 7.5X6 YLW CONV (MISCELLANEOUS) ×2 IMPLANT
PAD FLOOR 36X40 (MISCELLANEOUS) ×1 IMPLANT
PADDING CAST COTTON 6X4 STRL (CAST SUPPLIES) ×2 IMPLANT
SET ARTHROSCOPY INST (INSTRUMENTS) ×2 IMPLANT
SET ARTHROSCOPY PUMP TUBE (IRRIGATION / IRRIGATOR) ×2 IMPLANT
SET BASIN LINEN APH (SET/KITS/TRAYS/PACK) ×2 IMPLANT
SPONGE GAUZE 4X4 12PLY (GAUZE/BANDAGES/DRESSINGS) ×2 IMPLANT
STRIP CLOSURE SKIN 1/2X4 (GAUZE/BANDAGES/DRESSINGS) IMPLANT
SUT ETHILON 3 0 FSL (SUTURE) IMPLANT
SYR 30ML LL (SYRINGE) ×2 IMPLANT
SYRINGE 10CC LL (SYRINGE) ×2 IMPLANT
WAND 50 DEG COVAC W/CORD (SURGICAL WAND) ×2 IMPLANT
WAND 90 DEG TURBOVAC W/CORD (SURGICAL WAND) IMPLANT
WAND ARTHRO PARAGON T2 (SURGICAL WAND) IMPLANT
YANKAUER SUCT BULB TIP 10FT TU (MISCELLANEOUS) ×8 IMPLANT

## 2011-08-15 NOTE — Anesthesia Preprocedure Evaluation (Signed)
Anesthesia Evaluation  Patient identified by MRN, date of birth, ID band Patient awake    Reviewed: Allergy & Precautions, H&P , NPO status , Patient's Chart, lab work & pertinent test results  History of Anesthesia Complications (+) Family history of anesthesia reaction  Airway Mallampati: II TM Distance: >3 FB Neck ROM: Full    Dental  (+) Edentulous Upper   Pulmonary sleep apnea , Current Smoker,    Pulmonary exam normal       Cardiovascular Regular Normal    Neuro/Psych PSYCHIATRIC DISORDERS Anxiety Depression    GI/Hepatic Neg liver ROS, GERD-  Medicated and Controlled,  Endo/Other  Negative Endocrine ROS  Renal/GU negative Renal ROS     Musculoskeletal  (+) Arthritis -, Osteoarthritis,    Abdominal Normal abdominal exam  (+)   Peds  Hematology negative hematology ROS (+)   Anesthesia Other Findings   Reproductive/Obstetrics                           Anesthesia Physical Anesthesia Plan  ASA: II  Anesthesia Plan: General   Post-op Pain Management:    Induction: Intravenous, Rapid sequence and Cricoid pressure planned  Airway Management Planned: Oral ETT  Additional Equipment:   Intra-op Plan:   Post-operative Plan: Extubation in OR  Informed Consent: I have reviewed the patients History and Physical, chart, labs and discussed the procedure including the risks, benefits and alternatives for the proposed anesthesia with the patient or authorized representative who has indicated his/her understanding and acceptance.   Dental advisory given  Plan Discussed with: CRNA  Anesthesia Plan Comments:         Anesthesia Quick Evaluation

## 2011-08-15 NOTE — H&P (View-Only) (Signed)
Chief complaint: Bilateral knee pain HPI:(4) A 55-year-old male comes in with swelling in the RIGHT knee and pain catching, locking, and dysfunction of the LEFT knee previous LEFT knee scope. The patient feels like there is grinding and bone. The bone rubbing in the LEFT knee. He is having trouble getting up from a squatting position.  ROS:(2) Back is stable, no neurologic symptoms.  PFSH: (1)  Past Medical History  Diagnosis Date  . Hyperlipidemia    Past Surgical History  Procedure Date  . Knee arthroscopy     left knee  . Knee arthroscopy     right knee   . Spinal fusion     x 2  . Neck fusion   . Back surgery   . Colonoscopy 06/27/2011    Procedure: COLONOSCOPY;  Surgeon: Najeeb U Rehman, MD;  Location: [facility];  Service: Endoscopy;  Laterality: N/A;  9:00 / Pt to be here at 9am for 10:45 procedure     Physical Exam(12) GENERAL: normal development   CDV: pulses are normal   Skin: normal  Lymph: nodes were not palpable/normal  Psychiatric: awake, alert and oriented  Neuro: normal sensation  MSK Ambulation with a noticeable limp. 1 RIGHT knee large effusion. Flexion remains 130. Mild medial joint line tenderness. Knee is stable. Muscle tone is normal. Meniscal signs are negative. 2 LEFT knee crepitance on range of motion, which is full. No effusion. Medial joint line tenderness. Positive McMurray sign. Knee is stable. Muscle tone normal. Strength normal. 3 Upper extremity exam  Inspection and palpation revealed no abnormalities in the upper extremities.  Range of motion is full without contracture.  Motor exam is normal with grade 5 strength.  The joints are fully reduced without subluxation.  There is no atrophy or tremor and muscle tone is normal.  All joints are stable.  Imaging: Previous knee films show that he has varus alignment to the LEFT knee with joint space narrowing medially. Moderate to severe  Assessment: Probable re\re tear, medial meniscus  with arthritis    Plan: Arthroscopy LEFT knee, partial medial meniscectomy.  Procedure note for RIGHT knee aspiration noted below  Knee Arthrocentesis with Injection Procedure Note  Pre-operative Diagnosis: right knee oa   Post-operative Diagnosis: same  Indications: Symptomatic relief of large effusion  Anesthesia: ethyl chloride    Procedure Details   Verbal consent was obtained for the procedure. The joint was prepped with Betadine and a small wheel of anesthetic was injected into the subcutaneous tissue. A 22 gauge needle was inserted into the superior aspect of the joint from a lateral approach. 30 ml of clear yellow fluid was removed from the joint and discarded. 3 ml 1% lidocaine and 40 mg of depomendrol was then injected into the joint through the same needle. The needle was removed and the area cleansed and dressed.  Complications:  None; patient tolerated the procedure well.  

## 2011-08-15 NOTE — Preoperative (Signed)
Beta Blockers   Reason not to administer Beta Blockers:Not Applicable 

## 2011-08-15 NOTE — Interval H&P Note (Signed)
History and Physical Interval Note:  08/15/2011 8:08 AM  Christopher [name]  has presented today for surgery, with the diagnosis of torn medial meniscus left knee  The various methods of treatment have been discussed with the patient and family. After consideration of risks, benefits and other options for treatment, the patient has consented to  Procedure(s): LEFT KNEE ARTHROSCOPY WITH MEDIAL MENISECTOMY as a surgical intervention .  The patients' history has been reviewed, patient examined, no change in status, stable for surgery.  I have reviewed the patients' chart and labs.  Questions were answered to the patient's satisfaction.     Fuller Canada

## 2011-08-15 NOTE — Anesthesia Procedure Notes (Addendum)
Procedure Name: Intubation Date/Time: 08/15/2011 8:33 AM Performed by: Carolyne Littles, AMY Pre-anesthesia Checklist: Patient identified, Timeout performed, Emergency Drugs available, Suction available and Patient being monitored Patient Re-evaluated:Patient Re-evaluated prior to inductionOxygen Delivery Method: Circle System Utilized Preoxygenation: Pre-oxygenation with 100% oxygen Intubation Type: IV induction, Rapid sequence and Circoid Pressure applied Laryngoscope Size: Miller and 3 Grade View: Grade I Tube size: 7.0 mm Number of attempts: 1 Placement Confirmation: ETT inserted through vocal cords under direct vision,  positive ETCO2 and breath sounds checked- equal and bilateral Secured at: 22 cm Tube secured with: Tape Dental Injury: Teeth and Oropharynx as per pre-operative assessment     Date/Time: 08/15/2011 8:35 AM Performed by: Carolyne Littles, AMY    Performed by: Carolyne Littles, AMY Comments: Induction at 5784.  Intubation at [phone]

## 2011-08-15 NOTE — Anesthesia Postprocedure Evaluation (Signed)
  Anesthesia Post-op Note  Patient: Christopher [name]  Procedure(s) Performed:  KNEE ARTHROSCOPY WITH MEDIAL MENISECTOMY - Partial Lateral Menisectomy; CHONDROPLASTY  Patient Location: PACU  Anesthesia Type: General  Level of Consciousness: awake, alert , oriented and patient cooperative  Airway and Oxygen Therapy: Patient Spontanous Breathing  Post-op Pain: none  Post-op Assessment: Post-op Vital signs reviewed, Patient's Cardiovascular Status Stable, Respiratory Function Stable, Patent Airway and No signs of Nausea or vomiting  Post-op Vital Signs: Reviewed and stable  Complications: No apparent anesthesia complications

## 2011-08-15 NOTE — Transfer of Care (Signed)
Immediate Anesthesia Transfer of Care Note  Patient: Christopher [name]  Procedure(s) Performed:  KNEE ARTHROSCOPY WITH MEDIAL MENISECTOMY - Partial Lateral Menisectomy; CHONDROPLASTY  Patient Location: PACU  Anesthesia Type: General  Level of Consciousness: awake, alert , oriented and patient cooperative  Airway & Oxygen Therapy: Patient Spontanous Breathing and Patient connected to face mask oxygen  Post-op Assessment: Report given to PACU RN, Post -op Vital signs reviewed and stable and Patient moving all extremities  Post vital signs: Reviewed and stable  Complications: No apparent anesthesia complications

## 2011-08-15 NOTE — Brief Op Note (Signed)
08/15/2011  9:14 AM  PATIENT:  Allen Kell  55 y.o. male  PRE-OPERATIVE DIAGNOSIS:  Torn Medial Meniscus Left Knee  POST-OPERATIVE DIAGNOSIS:  Arthritis, Lateral Meniscal Tear  PROCEDURE:  Procedure(s): KNEE ARTHROSCOPY WITH PARTIAL LATERAL  MENISECTOMY CHONDROPLASTY MEDICAL FEMORAL CONDYLE AND MEDIAL TIBIAL PLATEAU   Findings grade 4 lesion medial tibial plateau posterior medial corner.                Grade 2 lesion medial femoral condyle                Small radial tear lateral meniscus posterior horn  SURGEON:  Surgeon(s): Fuller Canada, MD  PHYSICIAN ASSISTANT:   ASSISTANTS: none   ANESTHESIA:   general  EBL:  Total I/O In: 300 [I.V.:300] Out: 5 [Blood:5]  BLOOD ADMINISTERED:none  DRAINS: NO   LOCAL MEDICATIONS USED:  MARCAINE W EPI 60 CC  SPECIMEN:  No Specimen  DISPOSITION OF SPECIMEN:  N/A  COUNTS:  YES  TOURNIQUET:  * Missing tourniquet times found for documented tourniquets in log:  11593 *  DICTATION: .Dragon Dictation  PLAN OF CARE: Discharge to home after PACU  PATIENT DISPOSITION:  PACU - hemodynamically stable.   Delay start of Pharmacological VTE agent (>24hrs) due to surgical blood loss or risk of bleeding:

## 2011-08-15 NOTE — Op Note (Signed)
08/15/2011  9:14 AM  PATIENT:  Christopher [name]  55 y.o. male  PRE-OPERATIVE DIAGNOSIS:  Torn Medial Meniscus Left Knee  POST-OPERATIVE DIAGNOSIS:  Arthritis, Lateral Meniscal Tear  PROCEDURE:  Procedure(s): KNEE ARTHROSCOPY WITH PARTIAL LATERAL  MENISECTOMY CHONDROPLASTY MEDICAL FEMORAL CONDYLE AND MEDIAL TIBIAL PLATEAU   Findings grade 4 lesion medial tibial plateau posterior medial corner.                Grade 2 lesion medial femoral condyle                Small radial tear lateral meniscus posterior horn  SURGEON:  Surgeon(s): Fuller Canada, MD  PHYSICIAN ASSISTANT:   ASSISTANTS: none   ANESTHESIA:   general  EBL:  Total I/O In: 300 [I.V.:300] Out: 5 [Blood:5]  BLOOD ADMINISTERED:none  DRAINS: NO   LOCAL MEDICATIONS USED:  MARCAINE W EPI 60 CC  SPECIMEN:  No Specimen  DISPOSITION OF SPECIMEN:  N/A  COUNTS:  YES  TOURNIQUET:  * Missing tourniquet times found for documented tourniquets in log:  11593 *  DICTATION: .Dragon Dictation  PLAN OF CARE: Discharge to home after PACU  PATIENT DISPOSITION:  PACU - hemodynamically stable.   Delay start of Pharmacological VTE agent (>24hrs) due to surgical blood loss or risk of bleeding:  NA  Procedure  The left marked as the surgical site the chart was updated patient was taken to the operating room for general anesthesia. After successful smooth intubation the patient's left leg was prepped and draped in sterile technique and placed the arthroscopic legholder the well leg was placed in a well-leg padded holder  Time I was completed  Lateral portal. The scope was placed into the medial compartment. Diagnostic arthroscopy revealed grade 4 lesion posterior medial tibial plateau, grade 2 lesion medial femoral condyle. Radial tear posterior horn lateral meniscus.  A medial portal was established. A probe was placed into the joint and structures were palpated again. This completed a second diagnostic  arthroscopy  Through the medial portal a chondroplasty was done on the medial femoral condyle, abrasion arthroplasty medial tibial plateau.   We then placed an ArthroCare wand to the medial portal into the lateral compartment and performed a partial lateral meniscectomy  The knee was irrigated and closed with 3-0 nylon sutures after injection was 60 cc of Marcaine with epinephrine  Sterile dressing was applied followed by an Ace wrap Cryo/Cuff which was activated  The patient will be weightbearing as tolerated will followup on Monday.

## 2011-08-17 ENCOUNTER — Encounter (HOSPITAL_COMMUNITY): Payer: Self-pay | Admitting: *Deleted

## 2011-08-17 ENCOUNTER — Emergency Department (HOSPITAL_COMMUNITY)
Admission: EM | Admit: 2011-08-17 | Discharge: 2011-08-17 | Disposition: A | Discharge status: Home / Self Care | Payer: Medicare Other | Attending: Emergency Medicine | Admitting: Emergency Medicine

## 2011-08-17 ENCOUNTER — Encounter (HOSPITAL_COMMUNITY): Payer: Self-pay | Admitting: Emergency Medicine

## 2011-08-17 DIAGNOSIS — Z79899 Other long term (current) drug therapy: Secondary | ICD-10-CM | POA: Insufficient documentation

## 2011-08-17 DIAGNOSIS — M25469 Effusion, unspecified knee: Secondary | ICD-10-CM | POA: Insufficient documentation

## 2011-08-17 DIAGNOSIS — F341 Dysthymic disorder: Secondary | ICD-10-CM | POA: Insufficient documentation

## 2011-08-17 DIAGNOSIS — K219 Gastro-esophageal reflux disease without esophagitis: Secondary | ICD-10-CM | POA: Insufficient documentation

## 2011-08-17 DIAGNOSIS — F17200 Nicotine dependence, unspecified, uncomplicated: Secondary | ICD-10-CM | POA: Insufficient documentation

## 2011-08-17 DIAGNOSIS — E785 Hyperlipidemia, unspecified: Secondary | ICD-10-CM | POA: Insufficient documentation

## 2011-08-17 DIAGNOSIS — M7989 Other specified soft tissue disorders: Secondary | ICD-10-CM | POA: Insufficient documentation

## 2011-08-17 DIAGNOSIS — M25569 Pain in unspecified knee: Secondary | ICD-10-CM | POA: Insufficient documentation

## 2011-08-17 DIAGNOSIS — Z9889 Other specified postprocedural states: Secondary | ICD-10-CM | POA: Insufficient documentation

## 2011-08-17 DIAGNOSIS — M25462 Effusion, left knee: Secondary | ICD-10-CM

## 2011-08-17 DIAGNOSIS — R112 Nausea with vomiting, unspecified: Secondary | ICD-10-CM | POA: Insufficient documentation

## 2011-08-17 LAB — BODY FLUID CELL COUNT WITH DIFFERENTIAL
Eos, Fluid: 0 %
Lymphs, Fluid: 37 %
Monocyte-Macrophage-Serous Fluid: 5 % — ABNORMAL LOW (ref 50–90)
Neutrophil Count, Fluid: 58 % — ABNORMAL HIGH (ref 0–25)
Other Cells, Fluid: 0 %
Total Nucleated Cell Count, Fluid: 2974 uL — ABNORMAL HIGH (ref 0–1000)

## 2011-08-17 LAB — GRAM STAIN: Special Requests: NORMAL

## 2011-08-17 MED ORDER — ONDANSETRON HCL 4 MG PO TABS: 8.0000 mg | ORAL_TABLET | Freq: Three times a day (TID) | ORAL | Status: AC | PRN

## 2011-08-17 MED ORDER — HYDROCODONE-ACETAMINOPHEN 5-325 MG PO TABS
1.0000 | ORAL_TABLET | Freq: Once | ORAL | Status: AC
  Administered 2011-08-17: 1 via ORAL
  Filled 2011-08-17: qty 1

## 2011-08-17 MED ORDER — HYDROMORPHONE HCL PF 2 MG/ML IJ SOLN
2.0000 mg | Freq: Once | INTRAMUSCULAR | Status: AC
  Administered 2011-08-17: 2 mg via INTRAMUSCULAR
  Filled 2011-08-17: qty 1

## 2011-08-17 MED ORDER — ONDANSETRON 8 MG PO TBDP
8.0000 mg | ORAL_TABLET | Freq: Once | ORAL | Status: AC
  Administered 2011-08-17: 8 mg via ORAL
  Filled 2011-08-17: qty 1

## 2011-08-17 MED ORDER — LIDOCAINE HCL (PF) 1 % IJ SOLN
INTRAMUSCULAR | Status: AC
  Administered 2011-08-17: 5 mL
  Filled 2011-08-17: qty 5

## 2011-08-17 NOTE — ED Notes (Signed)
Patient with c/o left knee pain. Recent Arthroscopic Knee surgery by Dr Romeo Apple. Seen this morning by Dr Oletta Lamas and fluid drained from knee. Patient reports the fluid is back. +swelling. CMS intact.

## 2011-08-17 NOTE — ED Provider Notes (Signed)
History     CSN: [id number] Arrival date & time: 08/17/2011  2:49 AM   First MD Initiated Contact with Patient 08/17/11 0301      Chief Complaint  Patient presents with  . Knee Pain  . Leg Swelling    (Consider location/radiation/quality/duration/timing/severity/associated sxs/prior treatment) HPI Comments: Patient reports that he has had problems with arthritis in his left knee for a long time. Dr. Romeo Apple has gone fluid out of his knee and perform arthroscopic surgery several times in the past. He reports he had another procedure 2 days ago to clean out arthritis. He reports he got home on Saturday and took some pain medication and took a nap. He woke up on Saturday night intermittent his pain. He reports pain especially on the medial side of his left knee is tremendous and not tolerable. He took more his pain medication and also tried his wife's hydrocodone without any significant relief. His wife did leave a message with Dr. Romeo Apple, and his orthopedist but has not had a return call. Therefore do to intractable pain the patient presented here to the emergency department. He denies any distal numbness or weakness. He denies any fevers or chills. He denies any recent injuries. He does have some healing abrasions at the bottom of his knee where apparently the patient had cut himself doing some shaving prior to the surgery.  Patient is a 55 y.o. male presenting with knee pain. The history is provided by the patient and the spouse.  Knee Pain    Past Medical History  Diagnosis Date  . Hyperlipidemia   . Anxiety   . Depression   . GERD (gastroesophageal reflux disease)   . Complication of anesthesia     pt had a hard time being able to move after spinal anesthesia , 3-4 hours  . Sleep apnea     uses CIPAP machine at night    Past Surgical History  Procedure Date  . Knee arthroscopy     left knee  . Knee arthroscopy     right knee   . Spinal fusion     x 2  . Neck fusion     . Back surgery   . Colonoscopy 06/27/2011    Procedure: COLONOSCOPY;  Surgeon: Malissa Hippo, MD;  Location: [facility];  Service: Endoscopy;  Laterality: N/A;  9:00 / Pt to be here at 9am for 10:45 procedure, benign polyps removed  . Appendectomy   . Hernia repair     umbilical hernia  . Cardiac catheterization     Family History  Problem Relation Age of Onset  . Diabetes    . Lung disease    . Arthritis    . Anesthesia problems Neg Hx   . Hypotension Neg Hx   . Malignant hyperthermia Neg Hx   . Pseudochol deficiency Neg Hx     History  Substance Use Topics  . Smoking status: Current Everyday Smoker -- 1.0 packs/day for 36 years    Types: Cigarettes  . Smokeless tobacco: Not on file  . Alcohol Use: No      Review of Systems  Constitutional: Negative.   Musculoskeletal: Positive for joint swelling and arthralgias. Negative for back pain.  Skin: Positive for color change. Negative for wound.  Neurological: Negative for weakness and numbness.    Allergies  Codeine; Doxycycline; Oxycodone-acetaminophen; Prednisone; and Relafen  Home Medications   Current Outpatient Rx  Name Route Sig Dispense Refill  . ALPRAZOLAM 0.5 MG PO TABS  Oral Take 0.5 mg by mouth 3 (three) times daily.      . CHOLESTYRAMINE 4 G PO PACK Oral Take 1 packet by mouth 2 (two) times daily between meals. 60 each 2    Take Questran 2 hours before or after taking other ...  . CYCLOBENZAPRINE HCL 10 MG PO TABS Oral Take 10 mg by mouth 3 (three) times daily.     Marland Kitchen DICYCLOMINE HCL 10 MG PO CAPS Oral Take 10 mg by mouth every other day.     Marland Kitchen DIPHENHYDRAMINE HCL 25 MG PO CAPS Oral Take 1 capsule (25 mg total) by mouth every 4 (four) hours as needed for itching. 60 capsule 2  . HYDROMORPHONE HCL 4 MG PO TABS Oral Take 1 tablet (4 mg total) by mouth every 4 (four) hours as needed for pain. 30 tablet 0  . METHYLPREDNISOLONE ACETATE 40 MG/ML IJ SUSP Intra-articular Inject 1 mL (40 mg total) into the  articular space once. 1 mL 0  . FISH OIL 1200 MG PO CAPS Oral Take 1 capsule by mouth daily.      Marland Kitchen PANTOPRAZOLE SODIUM 40 MG PO TBEC Oral Take 40 mg by mouth daily.     . SEROQUEL 50 MG PO TABS Oral Take 50 mg by mouth at bedtime.     . SERTRALINE HCL 50 MG PO TABS Oral Take 50 mg by mouth daily.     Marland Kitchen TEMAZEPAM 30 MG PO CAPS Oral Take 30 mg by mouth at bedtime.       BP 141/79  Pulse 70  Temp 97.6 F (36.4 C)  Resp 20  Ht 5\' 6"  (1.676 m)  Wt 205 lb (92.987 kg)  BMI 33.09 kg/m2  SpO2 96%  Physical Exam  Nursing note and vitals reviewed. Constitutional: He appears well-developed and well-nourished.  Musculoskeletal:       Legs: Neurological: He is alert.  Skin: Skin is warm.    ED Course  Apiration of blood/fluid Date/Time: 08/17/2011 4:57 AM Performed by: Lear Ng. Authorized by: Lear Ng Consent: Verbal consent obtained. Risks and benefits: risks, benefits and alternatives were discussed Consent given by: patient Patient understanding: patient states understanding of the procedure being performed Site marked: the operative site was marked Patient identity confirmed: verbally with patient Time out: Immediately prior to procedure a "time out" was called to verify the correct patient, procedure, equipment, support staff and site/side marked as required. Preparation: Patient was prepped and draped in the usual sterile fashion. Local anesthesia used: yes Anesthesia: local infiltration Local anesthetic: lidocaine 1% without epinephrine Anesthetic total: 4 ml Patient sedated: no Patient tolerance: Patient tolerated the procedure well with no immediate complications. Comments: Arthrocentesis of left knee effusion performed, serosanguinous fluid, not purulent, 78 ml drawn out of left knee using mid lateral approach.  Fluid sent for cell count, GS, culture.  Pt felt improved after procedure.  Dressings applied to site following procedure.     (including critical  care time)  Labs Reviewed - No data to display No results found.   No diagnosis found.    MDM  Pt has some ROM, I doubt septic joint.  However, still possible.  Likely effusion post operative. Will attempt to contact Dr. Romeo Apple.  Will consider arthrocentesis for both diagnosis and therapeutic.    I reviewed pt's op notes dated 12/14.  Pt with meniscal injuries mostly, medial, but also lateral.        4:54 AM Dr. Romeo Apple did not call.  Given degree of pain, I recommended and pt and family agreed to arthrocentesis, see procedure note.  Did not appear purulent.  Pt felt improved after procedure, knee appears less swollen.  Will d/c home.  Fluid sent for GS, culture for follow up.    Gavin Pound. Oletta Lamas, MD 08/17/11 [phone]

## 2011-08-17 NOTE — ED Notes (Signed)
Aspiration site cleaned & covered w/ bandaid.

## 2011-08-17 NOTE — ED Notes (Signed)
Pt had knee surgery on Friday. Pt c/o increased pain and swelling to left knee.

## 2011-08-17 NOTE — ED Notes (Signed)
Pt states a pressure type feeling to the left knee. Unable to find comfortable position, pain meds not working at this time,

## 2011-08-17 NOTE — Discharge Instructions (Signed)
Knee Effusion °The medical term for having fluid in your knee is effusion. This is often due to an internal derangement of the knee. This means something is wrong inside the knee. Some of the causes of fluid in the knee may be torn cartilage, a torn ligament, or bleeding into the joint from an injury. Your knee is likely more difficult to bend and move. This is often because there is increased pain and pressure in the joint. The time it takes for recovery from a knee effusion depends on different factors, including:  °· Type of injury.  °· Your age.  °· Physical and medical conditions.  °· Rehabilitation Strategies.  °How long you will be away from your normal activities will depend on what kind of knee problem you have and how much damage is present. Your knee has two types of cartilage. Articular cartilage covers the bone ends and lets your knee bend and move smoothly. Two menisci, thick pads of cartilage that form a rim inside the joint, help absorb shock and stabilize your knee. Ligaments bind the bones together and support your knee joint. Muscles move the joint, help support your knee, and take stress off the joint itself. °CAUSES  °Often an effusion in the knee is caused by an injury to one of the menisci. This is often a tear in the cartilage. Recovery after a meniscus injury depends on how much meniscus is damaged and whether you have damaged other knee tissue. Small tears may heal on their own with conservative treatment. Conservative means rest, limited weight bearing activity and muscle strengthening exercises. Your recovery may take up to 6 weeks.  °TREATMENT  °Larger tears may require surgery. Meniscus injuries may be treated during arthroscopy. Arthroscopy is a procedure in which your surgeon uses a small telescope like instrument to look in your knee. Your caregiver can make a more accurate diagnosis (learning what is wrong) by performing an arthroscopic procedure. °If your injury is on the inner  margin of the meniscus, your surgeon may trim the meniscus back to a smooth rim. In other cases your surgeon will try to repair a damaged meniscus with stitches (sutures). This may make rehabilitation take longer, but may provide better long term result by helping your knee keep its shock absorption capabilities. °Ligaments which are completely torn usually require surgery for repair. °HOME CARE INSTRUCTIONS °· Use crutches as instructed.  °· If a brace is applied, use as directed.  °· Once you are home, an ice pack applied to your swollen knee may help with discomfort and help decrease swelling.  °· Keep your knee raised (elevated) when you are not up and around or on crutches.  °· Only take over-the-counter or prescription medicines for pain, discomfort, or fever as directed by your caregiver.  °· Your caregivers will help with instructions for rehabilitation of your knee. This often includes strengthening exercises.  °· You may resume a normal diet and activities as directed.  °SEEK MEDICAL CARE IF:  °· There is increased swelling in your knee.  °· You notice redness, swelling, or increasing pain in your knee.  °· An unexplained oral temperature above 102° F (38.9° C) develops.  °SEEK IMMEDIATE MEDICAL CARE IF:  °· You develop a rash.  °· You have difficulty breathing.  °· You have any allergic reactions from medications you may have been given.  °· There is severe pain with any motion of the knee.  °MAKE SURE YOU:  °· Understand these instructions.  °·   Will watch your condition.  °· Will get help right away if you are not doing well or get worse.  °Document Released: 11/08/2003 Document Revised: 04/30/2011 Document Reviewed: 01/12/2008 °[employer]® Patient Information ©2012 [employer]. °

## 2011-08-17 NOTE — ED Notes (Signed)
Pt given discharge instructions, paperwork, pt verbalized understanding.   

## 2011-08-17 NOTE — ED Provider Notes (Signed)
History     CSN: [id number] Arrival date & time: 08/17/2011  6:03 PM   First MD Initiated Contact with Patient 08/17/11 1814      Chief Complaint  Patient presents with  . Knee Pain    (Consider location/radiation/quality/duration/timing/severity/associated sxs/prior treatment) HPI Comments: Patient returns after being seeing early this morning for evaluation of a knee effusion he developed after having an arthroscopic procedure by Dr. Romeo Apple 2 days ago.  His knee was aspirated this morning,  And he reports temporary relief,  But now the pain and swelling has returned.  He has not been able to use his pain medication as it has been making him nauseated.  He denies fever,  Diarrhea and abdominal pain.  Patient is a 55 y.o. male presenting with knee pain. The history is provided by the patient.  Knee Pain Associated symptoms include arthralgias, joint swelling, nausea and vomiting. Pertinent negatives include no abdominal pain, chest pain, chills, congestion, diaphoresis, fever, headaches, neck pain, numbness, rash, sore throat or weakness. Exacerbated by: movement.    Past Medical History  Diagnosis Date  . Hyperlipidemia   . Anxiety   . Depression   . GERD (gastroesophageal reflux disease)   . Complication of anesthesia     pt had a hard time being able to move after spinal anesthesia , 3-4 hours  . Sleep apnea     uses CIPAP machine at night    Past Surgical History  Procedure Date  . Knee arthroscopy     left knee  . Knee arthroscopy     right knee   . Spinal fusion     x 2  . Neck fusion   . Back surgery   . Colonoscopy 06/27/2011    Procedure: COLONOSCOPY;  Surgeon: Malissa Hippo, MD;  Location: [facility];  Service: Endoscopy;  Laterality: N/A;  9:00 / Pt to be here at 9am for 10:45 procedure, benign polyps removed  . Appendectomy   . Hernia repair     umbilical hernia  . Cardiac catheterization     Family History  Problem Relation Age of Onset  .  Diabetes    . Lung disease    . Arthritis    . Anesthesia problems Neg Hx   . Hypotension Neg Hx   . Malignant hyperthermia Neg Hx   . Pseudochol deficiency Neg Hx     History  Substance Use Topics  . Smoking status: Current Everyday Smoker -- 1.0 packs/day for 36 years    Types: Cigarettes  . Smokeless tobacco: Not on file  . Alcohol Use: No      Review of Systems  Constitutional: Negative for fever, chills and diaphoresis.  HENT: Negative for congestion, sore throat and neck pain.   Eyes: Negative.   Respiratory: Negative for chest tightness and shortness of breath.   Cardiovascular: Negative for chest pain.  Gastrointestinal: Positive for nausea and vomiting. Negative for abdominal pain.  Genitourinary: Negative.   Musculoskeletal: Positive for joint swelling and arthralgias.  Skin: Negative.  Negative for rash and wound.  Neurological: Negative for dizziness, weakness, light-headedness, numbness and headaches.  Hematological: Negative.   Psychiatric/Behavioral: Negative.     Allergies  Codeine; Doxycycline; Oxycodone-acetaminophen; Prednisone; and Relafen  Home Medications   Current Outpatient Rx  Name Route Sig Dispense Refill  . ALPRAZOLAM 0.5 MG PO TABS Oral Take 0.5 mg by mouth 3 (three) times daily.      . CHOLESTYRAMINE 4 G PO PACK Oral Take  1 packet by mouth 2 (two) times daily between meals. 60 each 2    Take Questran 2 hours before or after taking other ...  . CYCLOBENZAPRINE HCL 10 MG PO TABS Oral Take 10 mg by mouth 3 (three) times daily.     Marland Kitchen DICYCLOMINE HCL 10 MG PO CAPS Oral Take 10 mg by mouth every other day.     Marland Kitchen DIPHENHYDRAMINE HCL 25 MG PO CAPS Oral Take 1 capsule (25 mg total) by mouth every 4 (four) hours as needed for itching. 60 capsule 2  . HYDROMORPHONE HCL 4 MG PO TABS Oral Take 1 tablet (4 mg total) by mouth every 4 (four) hours as needed for pain. 30 tablet 0  . METHYLPREDNISOLONE ACETATE 40 MG/ML IJ SUSP Intra-articular Inject 1 mL  (40 mg total) into the articular space once. 1 mL 0  . FISH OIL 1200 MG PO CAPS Oral Take 1 capsule by mouth daily.      Marland Kitchen PANTOPRAZOLE SODIUM 40 MG PO TBEC Oral Take 40 mg by mouth daily.     . SEROQUEL 50 MG PO TABS Oral Take 50 mg by mouth at bedtime.     . SERTRALINE HCL 50 MG PO TABS Oral Take 50 mg by mouth daily.     Marland Kitchen TEMAZEPAM 30 MG PO CAPS Oral Take 30 mg by mouth at bedtime.       BP 158/89  Pulse 78  Temp(Src) 97.4 F (36.3 C) (Oral)  Resp 18  Ht 5\' 6"  (1.676 m)  Wt 205 lb (92.987 kg)  BMI 33.09 kg/m2  SpO2 98%  Physical Exam  Nursing note and vitals reviewed. Constitutional: He is oriented to person, place, and time. He appears well-developed and well-nourished.  HENT:  Head: Normocephalic and atraumatic.  Eyes: Conjunctivae are normal.  Neck: Normal range of motion.  Cardiovascular: Normal rate, regular rhythm, normal heart sounds and intact distal pulses.  Exam reveals no decreased pulses.   Pulses:      Dorsalis pedis pulses are 2+ on the right side, and 2+ on the left side.       Posterior tibial pulses are 2+ on the right side, and 2+ on the left side.  Pulmonary/Chest: Effort normal and breath sounds normal. He has no wheezes.  Abdominal: Soft. Bowel sounds are normal. There is no tenderness.  Musculoskeletal: He exhibits edema and tenderness.       Left knee: He exhibits decreased range of motion, swelling and effusion. He exhibits no erythema. tenderness found. Medial joint line and lateral joint line tenderness noted.  Neurological: He is alert and oriented to person, place, and time. No sensory deficit.  Skin: Skin is warm, dry and intact.  Psychiatric: He has a normal mood and affect.    ED Course  Procedures (including critical care time)  Labs Reviewed - No data to display No results found.   No diagnosis found.  Aspirate results from prior visit reviewed.  Zofran given pt.  Able to tolerate po pain medication after receiving zofran.  Discussed  risks of multiple joint taps.  Patient agrees to wait to see Dr Romeo Apple in am for further management of his knee.  Pt is more comforable at dc,  Has been ambulating in dept.  MDM   No signs of knee joint infection.  Joint effusion culture still pending.       Candis Musa, PA 08/18/11 1257

## 2011-08-17 NOTE — ED Notes (Signed)
Pt a/ox4. Resp even and unlabored. NAD at this time. D/C instructions reviewed with pt. Pt verbalized understanding. Pt ambulated to lobby with crutches.

## 2011-08-18 ENCOUNTER — Telehealth: Payer: Self-pay | Admitting: Orthopedic Surgery

## 2011-08-18 ENCOUNTER — Encounter: Payer: Self-pay | Admitting: Orthopedic Surgery

## 2011-08-18 ENCOUNTER — Ambulatory Visit (INDEPENDENT_AMBULATORY_CARE_PROVIDER_SITE_OTHER): Payer: Medicare Other | Admitting: Orthopedic Surgery

## 2011-08-18 VITALS — BP 130/90 | Ht 66.0 in | Wt 205.0 lb

## 2011-08-18 DIAGNOSIS — M25469 Effusion, unspecified knee: Secondary | ICD-10-CM

## 2011-08-18 DIAGNOSIS — M25569 Pain in unspecified knee: Secondary | ICD-10-CM | POA: Insufficient documentation

## 2011-08-18 DIAGNOSIS — M25462 Effusion, left knee: Secondary | ICD-10-CM

## 2011-08-18 MED ORDER — HYDROCODONE-ACETAMINOPHEN 10-325 MG PO TABS: 1.0000 | ORAL_TABLET | ORAL | Status: AC | PRN

## 2011-08-18 NOTE — Telephone Encounter (Signed)
This was addressed by Dr. Romeo Apple at patient's post op appointment today. Done.

## 2011-08-18 NOTE — Progress Notes (Signed)
Subjective:     Patient ID: Christopher [name], male   DOB: September 12, 1955, 55 y.o.   MRN: [id number]  HPIVisit #1 status post LEFT knee arthroscopy on December 14  Operative findings grade 4 lesion and tibial plateau grade 2 lesion medial femoral condyle previous meniscectomy site normal partial lateral meniscectomy for partial lateral meniscal tear  Patient has significant difficulties over the weekend presented to the emergency room twice with swollen knee initial time of blood was drawn 4 vials of 25 cc presents today with swelling again  I aspirated her knee again.   Review of Systems     Objective:   Physical ExamModerate effusion LEFT knee flexion to 90 extension -10  Aspirated synovial fluid with small amount of blood in it.  I aspirated about 50 cc.  Patient felt immediate relief.  Sutures were LEFT in.     Assessment:     Postop to be effusion      Plan:     Ice, change medication to hydrocodone 10 mg which he does tolerate.  Return today's recheck knee.    Aspiration LEFT knee.  Verbal consent was obtained, timeout was completed.  Sterile prep of the LEFT knee.  Lateral approach, suprapatellar  Aspiration of 50 cc of clear, yellow fluid.  Injection of Depo-Medrol 40 mg per cc, 1 cc, and 4 cc 1% plain lidocaine.  No complications.

## 2011-08-18 NOTE — Telephone Encounter (Signed)
Patient/wife called to relay that he went to [facility] Emergency Room twice since his surgery on Friday, 08/15/11, due to the operative knee swelling and hurting; wife states 4 tubes of fluid drained from the knee early Sunday morning, 08/17/11.  She states that later in the same afternoon, yesterday 08/17/11, 4:00pm, he returned to the E.R., due to more swelling recurring.  She states he has been vomiting, states may not be able to take the medication prescribed on Friday.  She states she gave him a Hydrocodone, which he was able to tolerate.  He is scheduled today, 08/18/11, for post op#1 appointment.

## 2011-08-18 NOTE — Patient Instructions (Signed)
Ice  Rest   

## 2011-08-19 ENCOUNTER — Ambulatory Visit: Payer: Medicare Other | Admitting: Orthopedic Surgery

## 2011-08-19 NOTE — ED Provider Notes (Signed)
Medical screening examination/treatment/procedure(s) were performed by non-physician practitioner and as supervising physician I was immediately available for consultation/collaboration.  Sabastien Tyler, MD 08/19/11 1913 

## 2011-08-20 ENCOUNTER — Encounter: Payer: Self-pay | Admitting: Orthopedic Surgery

## 2011-08-20 ENCOUNTER — Ambulatory Visit (INDEPENDENT_AMBULATORY_CARE_PROVIDER_SITE_OTHER): Payer: Medicare Other | Admitting: Orthopedic Surgery

## 2011-08-20 DIAGNOSIS — M25469 Effusion, unspecified knee: Secondary | ICD-10-CM

## 2011-08-21 ENCOUNTER — Ambulatory Visit: Payer: Medicare Other | Admitting: Orthopedic Surgery

## 2011-08-21 LAB — BODY FLUID CULTURE: Culture: NO GROWTH

## 2011-08-31 ENCOUNTER — Encounter: Payer: Self-pay | Admitting: Orthopedic Surgery

## 2011-08-31 NOTE — Progress Notes (Signed)
Patient ID: Christopher [name], male   DOB: [birth year], 55 y.o.   MRN: [id number] Visit # 2  status post LEFT knee arthroscopy on December 14  Operative findings grade 4 lesion and tibial plateau grade 2 lesion medial femoral condyle previous meniscectomy site normal partial lateral meniscectomy for partial lateral meniscal tear   Status post aspiration of the knee for the second time which I did on the last visit  The patient is in much better condition.  He is ambulating without a supportive device, there is a small effusion now.  His range of motion is returned to near full.  His strength is good his knee is stable  We re-aspirated the knee again only obtained about 15 cc of fluid  No injection of cortisone was done  The patient is advised to start physical therapy and continue until his knee he has regained full range of motion and strength.  Aspiration LEFT knee.  Verbal consent was obtained, timeout was completed.  Sterile prep of the LEFT knee.  Lateral approach, suprapatellar  Aspiration of 15 cc of clear, yellow fluid.  No complications.

## 2011-09-23 ENCOUNTER — Encounter: Payer: Self-pay | Admitting: Orthopedic Surgery

## 2011-09-23 ENCOUNTER — Ambulatory Visit (INDEPENDENT_AMBULATORY_CARE_PROVIDER_SITE_OTHER): Payer: Medicare Other | Admitting: Orthopedic Surgery

## 2011-09-23 DIAGNOSIS — M23302 Other meniscus derangements, unspecified lateral meniscus, unspecified knee: Secondary | ICD-10-CM

## 2011-09-23 DIAGNOSIS — M23209 Derangement of unspecified meniscus due to old tear or injury, unspecified knee: Secondary | ICD-10-CM

## 2011-09-23 DIAGNOSIS — M1710 Unilateral primary osteoarthritis, unspecified knee: Secondary | ICD-10-CM

## 2011-09-23 NOTE — Patient Instructions (Signed)
Activity as tolerated

## 2011-09-23 NOTE — Progress Notes (Signed)
Patient ID: [name], male   DOB: Jan 12, 1956, 56 y.o.   MRN: [id number]  Status post knee arthroscopy, LEFT side, and doing well with no difficulties problems.  Exam normal.  Activities as tolerated. Follow up as needed

## 2011-09-26 ENCOUNTER — Other Ambulatory Visit (INDEPENDENT_AMBULATORY_CARE_PROVIDER_SITE_OTHER): Payer: Self-pay | Admitting: Internal Medicine

## 2011-12-18 ENCOUNTER — Encounter (INDEPENDENT_AMBULATORY_CARE_PROVIDER_SITE_OTHER): Payer: Self-pay

## 2012-04-27 ENCOUNTER — Encounter: Payer: Self-pay | Admitting: Orthopedic Surgery

## 2012-04-27 ENCOUNTER — Ambulatory Visit (INDEPENDENT_AMBULATORY_CARE_PROVIDER_SITE_OTHER): Payer: Medicare Other | Admitting: Orthopedic Surgery

## 2012-04-27 VITALS — BP 120/78 | Ht 66.0 in | Wt 211.0 lb

## 2012-04-27 DIAGNOSIS — M25462 Effusion, left knee: Secondary | ICD-10-CM

## 2012-04-27 DIAGNOSIS — M1710 Unilateral primary osteoarthritis, unspecified knee: Secondary | ICD-10-CM

## 2012-04-27 DIAGNOSIS — M25469 Effusion, unspecified knee: Secondary | ICD-10-CM

## 2012-04-27 NOTE — Patient Instructions (Signed)
You have received a steroid shot. 15% of patients experience increased pain at the injection site with in the next 24 hours. This is best treated with ice and tylenol extra strength 2 tabs every 8 hours. If you are still having pain please call the office.    

## 2012-04-27 NOTE — Progress Notes (Signed)
Patient ID: Christopher [name], male   DOB: 10-13-1955, 56 y.o.   MRN: [id number] Pain swelling both knees  History of bilateral knee problems with osteoarthritis worse on the left status post bilateral arthroscopies  Status post disc fusions  Comes in today with increased pain in the left knee swelling bilaterally  Difficulty getting up and down trouble kneeling. Symptoms started when he did weed eating several days in a row over a large lot  Review of systems denies numbness or tingling or vascular disease or symptoms of claudication. No chest pain or palpitations. No numbness no tingling  Does complain of weakness in his knees and pain at night with throbbing especially on the left  Vital signs are BP 120/78  Ht 5\' 6"  (1.676 m)  Wt 211 lb (95.709 kg)  BMI 34.06 kg/m2  Appearance is normal. Orientation x3 normal. Mood and affect normal. Ambulation abnormal favoring his left leg more than the right.  Having some back pain today with tenderness and difficulty flexing from a lying position  Right knee we'll see joint effusion his range of motion is painful. His flexion ARC is 120. He is. Ability and strength is normal skin is intact negative McMurray sign he has some medial joint line tenderness pulse and temperature are normal.  Left knee small effusion. Flexion 120. Stability normal strength intact skin normal pulse and temperature normal.  Aspirate left knee with injection I did give at 40 cc of clear fluid followed by injection  Inject right knee  Reviewed old x-rays he does have some medial compartment where on the left not as bad on the right  Recommend activity modification. Questionable if he is a good candidate for knee replacement surgery because of pain tolerance    Knee  Injection Procedure Note  Pre-operative Diagnosis: right knee oa  Post-operative Diagnosis: same  Indications: pain  Anesthesia: ethyl chloride   Procedure Details   Verbal consent was  obtained for the procedure. Time out was completed.The joint was prepped with alcohol, followed by  Ethyl chloride spray and A 20 gauge needle was inserted into the knee via lateral approach; 4ml 1% lidocaine and 1 ml of depomedrol  was then injected into the joint . The needle was removed and the area cleansed and dressed.  Complications:  None; patient tolerated the procedure well.  Knee  Injection and aspiration Procedure Note  Pre-operative Diagnosis: left knee oa, effusion  Post-operative Diagnosis: same  Indications: pain, swelling  Anesthesia: ethyl chloride   Procedure Details   Verbal consent was obtained for the procedure. Time out was completed.The joint was prepped with alcohol, followed by  Ethyl chloride spray and The 18-gauge needle was inserted into the joint via lateral approach and we aspirated approximately 40 cc of clear yellow fluid  This was followed by the injection of 4ml 1% lidocaine and 1 ml of depomedrol  was then injected into the joint . The needle was removed and the area cleansed and dressed.  Complications:  None; patient tolerated the procedure well.

## 2012-08-24 ENCOUNTER — Other Ambulatory Visit: Payer: Self-pay | Admitting: Neurosurgery

## 2012-08-24 DIAGNOSIS — M79604 Pain in right leg: Secondary | ICD-10-CM

## 2012-09-02 ENCOUNTER — Ambulatory Visit
Admission: RE | Admit: 2012-09-02 | Discharge: 2012-09-02 | Disposition: A | Discharge status: Home / Self Care | Payer: Medicare Other | Source: Ambulatory Visit | Attending: Neurosurgery | Admitting: Neurosurgery

## 2012-09-02 VITALS — BP 127/72 | HR 55

## 2012-09-02 DIAGNOSIS — M25569 Pain in unspecified knee: Secondary | ICD-10-CM

## 2012-09-02 DIAGNOSIS — M79604 Pain in right leg: Secondary | ICD-10-CM

## 2012-09-02 MED ORDER — IOHEXOL 180 MG/ML  SOLN
18.0000 mL | Freq: Once | INTRAMUSCULAR | Status: AC | PRN
  Administered 2012-09-02: 18 mL via INTRATHECAL

## 2012-09-02 MED ORDER — ONDANSETRON HCL 4 MG/2ML IJ SOLN: 4.0000 mg | Freq: Four times a day (QID) | INTRAMUSCULAR | Status: DC | PRN

## 2012-09-02 MED ORDER — DIAZEPAM 5 MG PO TABS
10.0000 mg | ORAL_TABLET | Freq: Once | ORAL | Status: AC
  Administered 2012-09-02: 10 mg via ORAL

## 2012-09-02 NOTE — Progress Notes (Signed)
Pt states he has been off zoloft for the past 2 days.

## 2012-09-10 ENCOUNTER — Encounter (HOSPITAL_COMMUNITY): Payer: Self-pay | Admitting: Pharmacy Technician

## 2012-09-10 ENCOUNTER — Other Ambulatory Visit: Payer: Self-pay | Admitting: Neurosurgery

## 2012-09-13 ENCOUNTER — Encounter (HOSPITAL_COMMUNITY): Payer: Self-pay | Admitting: *Deleted

## 2012-09-13 MED ORDER — CEFAZOLIN SODIUM-DEXTROSE 2-3 GM-% IV SOLR
2.0000 g | INTRAVENOUS | Status: AC
  Administered 2012-09-14: 2 g via INTRAVENOUS
  Filled 2012-09-13: qty 50

## 2012-09-14 ENCOUNTER — Inpatient Hospital Stay (HOSPITAL_COMMUNITY)
Admission: RE | Admit: 2012-09-14 | Discharge: 2012-09-19 | DRG: 491 | Disposition: A | Discharge status: Home Health | Payer: Medicare Other | Source: Ambulatory Visit | Attending: Neurosurgery | Admitting: Neurosurgery

## 2012-09-14 ENCOUNTER — Encounter (HOSPITAL_COMMUNITY): Payer: Self-pay | Admitting: Anesthesiology

## 2012-09-14 ENCOUNTER — Inpatient Hospital Stay (HOSPITAL_COMMUNITY): Payer: Medicare Other

## 2012-09-14 ENCOUNTER — Inpatient Hospital Stay (HOSPITAL_COMMUNITY): Payer: Medicare Other | Admitting: Anesthesiology

## 2012-09-14 ENCOUNTER — Encounter (HOSPITAL_COMMUNITY): Payer: Self-pay | Admitting: *Deleted

## 2012-09-14 ENCOUNTER — Encounter (HOSPITAL_COMMUNITY)
Admission: RE | Disposition: A | Discharge status: Home Health | Payer: Self-pay | Source: Ambulatory Visit | Attending: Neurosurgery

## 2012-09-14 DIAGNOSIS — M129 Arthropathy, unspecified: Secondary | ICD-10-CM | POA: Diagnosis present

## 2012-09-14 DIAGNOSIS — F3289 Other specified depressive episodes: Secondary | ICD-10-CM | POA: Diagnosis present

## 2012-09-14 DIAGNOSIS — Z79899 Other long term (current) drug therapy: Secondary | ICD-10-CM

## 2012-09-14 DIAGNOSIS — E785 Hyperlipidemia, unspecified: Secondary | ICD-10-CM | POA: Diagnosis present

## 2012-09-14 DIAGNOSIS — M5126 Other intervertebral disc displacement, lumbar region: Principal | ICD-10-CM | POA: Diagnosis present

## 2012-09-14 DIAGNOSIS — R197 Diarrhea, unspecified: Secondary | ICD-10-CM

## 2012-09-14 DIAGNOSIS — F411 Generalized anxiety disorder: Secondary | ICD-10-CM | POA: Diagnosis present

## 2012-09-14 DIAGNOSIS — R51 Headache: Secondary | ICD-10-CM | POA: Diagnosis not present

## 2012-09-14 DIAGNOSIS — Z6833 Body mass index (BMI) 33.0-33.9, adult: Secondary | ICD-10-CM

## 2012-09-14 DIAGNOSIS — G4730 Sleep apnea, unspecified: Secondary | ICD-10-CM | POA: Diagnosis present

## 2012-09-14 DIAGNOSIS — K219 Gastro-esophageal reflux disease without esophagitis: Secondary | ICD-10-CM | POA: Diagnosis present

## 2012-09-14 DIAGNOSIS — R29898 Other symptoms and signs involving the musculoskeletal system: Secondary | ICD-10-CM | POA: Diagnosis not present

## 2012-09-14 DIAGNOSIS — E669 Obesity, unspecified: Secondary | ICD-10-CM | POA: Diagnosis present

## 2012-09-14 DIAGNOSIS — F329 Major depressive disorder, single episode, unspecified: Secondary | ICD-10-CM | POA: Diagnosis present

## 2012-09-14 HISTORY — DX: Cardiac murmur, unspecified: R01.1

## 2012-09-14 HISTORY — DX: Other specified postprocedural states: Z98.890

## 2012-09-14 HISTORY — PX: LUMBAR LAMINECTOMY/DECOMPRESSION MICRODISCECTOMY: SHX5026

## 2012-09-14 HISTORY — DX: Unspecified osteoarthritis, unspecified site: M19.90

## 2012-09-14 HISTORY — DX: Headache: R51

## 2012-09-14 HISTORY — DX: Nausea with vomiting, unspecified: R11.2

## 2012-09-14 LAB — CBC
HCT: 38.9 % — ABNORMAL LOW (ref 39.0–52.0)
Hemoglobin: 13.5 g/dL (ref 13.0–17.0)
MCH: 28.6 pg (ref 26.0–34.0)
MCHC: 34.7 g/dL (ref 30.0–36.0)
Platelets: 172 10*3/uL (ref 150–400)
RDW: 13.5 % (ref 11.5–15.5)
RDW: 13.6 % (ref 11.5–15.5)
WBC: 12.7 10*3/uL — ABNORMAL HIGH (ref 4.0–10.5)

## 2012-09-14 LAB — SURGICAL PCR SCREEN
MRSA, PCR: POSITIVE — AB
Staphylococcus aureus: POSITIVE — AB

## 2012-09-14 LAB — CREATININE, SERUM
GFR calc Af Amer: >90 mL/min (ref >90)
GFR calc non Af Amer: 78 mL/min — ABNORMAL LOW (ref >90)

## 2012-09-14 SURGERY — LUMBAR LAMINECTOMY/DECOMPRESSION MICRODISCECTOMY 1 LEVEL
Anesthesia: General | Site: Back | Laterality: Right | Wound class: Clean

## 2012-09-14 MED ORDER — SODIUM CHLORIDE 0.9 % IJ SOLN
3.0000 mL | Freq: Two times a day (BID) | INTRAMUSCULAR | Status: DC
  Administered 2012-09-15: 3 mL via INTRAVENOUS

## 2012-09-14 MED ORDER — GLYCOPYRROLATE 0.2 MG/ML IJ SOLN
INTRAMUSCULAR | Status: DC | PRN
  Administered 2012-09-14: .4 mg via INTRAVENOUS

## 2012-09-14 MED ORDER — SODIUM CHLORIDE 0.9 % IV SOLN: 250.0000 mL | INTRAVENOUS | Status: DC

## 2012-09-14 MED ORDER — NEOSTIGMINE METHYLSULFATE 1 MG/ML IJ SOLN
INTRAMUSCULAR | Status: DC | PRN
  Administered 2012-09-14: 3 mg via INTRAVENOUS

## 2012-09-14 MED ORDER — BUPIVACAINE LIPOSOME 1.3 % IJ SUSP
20.0000 mL | INTRAMUSCULAR | Status: AC
  Filled 2012-09-14: qty 20

## 2012-09-14 MED ORDER — MUPIROCIN 2 % EX OINT
TOPICAL_OINTMENT | Freq: Two times a day (BID) | CUTANEOUS | Status: DC
  Administered 2012-09-14 – 2012-09-19 (×10): via NASAL

## 2012-09-14 MED ORDER — MIDAZOLAM HCL 5 MG/5ML IJ SOLN
INTRAMUSCULAR | Status: DC | PRN
  Administered 2012-09-14: 2 mg via INTRAVENOUS

## 2012-09-14 MED ORDER — PHENOL 1.4 % MT LIQD: 1.0000 | OROMUCOSAL | Status: DC | PRN

## 2012-09-14 MED ORDER — ONDANSETRON HCL 4 MG/2ML IJ SOLN
4.0000 mg | INTRAMUSCULAR | Status: DC | PRN
  Administered 2012-09-14: 4 mg via INTRAVENOUS
  Filled 2012-09-14: qty 2

## 2012-09-14 MED ORDER — QUETIAPINE FUMARATE 50 MG PO TABS
50.0000 mg | ORAL_TABLET | Freq: Every day | ORAL | Status: DC
  Administered 2012-09-14 – 2012-09-18 (×5): 50 mg via ORAL
  Filled 2012-09-14 (×6): qty 1

## 2012-09-14 MED ORDER — HYDROMORPHONE 0.3 MG/ML IV SOLN
INTRAVENOUS | Status: DC
  Administered 2012-09-14: 2.4 mg via INTRAVENOUS
  Administered 2012-09-14: 2.7 mg via INTRAVENOUS
  Filled 2012-09-14: qty 25

## 2012-09-14 MED ORDER — ONDANSETRON HCL 4 MG/2ML IJ SOLN: 4.0000 mg | Freq: Four times a day (QID) | INTRAMUSCULAR | Status: DC | PRN

## 2012-09-14 MED ORDER — FENTANYL CITRATE 0.05 MG/ML IJ SOLN
INTRAMUSCULAR | Status: DC | PRN
  Administered 2012-09-14 (×2): 50 ug via INTRAVENOUS
  Administered 2012-09-14: 150 ug via INTRAVENOUS

## 2012-09-14 MED ORDER — PANTOPRAZOLE SODIUM 40 MG PO TBEC
40.0000 mg | DELAYED_RELEASE_TABLET | Freq: Every day | ORAL | Status: DC
  Administered 2012-09-15 – 2012-09-19 (×5): 40 mg via ORAL
  Filled 2012-09-14 (×2): qty 1

## 2012-09-14 MED ORDER — VANCOMYCIN HCL 1000 MG IV SOLR
1000.0000 mg | INTRAVENOUS | Status: DC | PRN
  Administered 2012-09-14: 1000 mg via INTRAVENOUS

## 2012-09-14 MED ORDER — ROCURONIUM BROMIDE 100 MG/10ML IV SOLN
INTRAVENOUS | Status: DC | PRN
  Administered 2012-09-14: 50 mg via INTRAVENOUS

## 2012-09-14 MED ORDER — ZOLPIDEM TARTRATE 5 MG PO TABS: 10.0000 mg | ORAL_TABLET | Freq: Every evening | ORAL | Status: DC | PRN

## 2012-09-14 MED ORDER — VANCOMYCIN HCL IN DEXTROSE 1-5 GM/200ML-% IV SOLN
1000.0000 mg | Freq: Two times a day (BID) | INTRAVENOUS | Status: DC
  Administered 2012-09-14 – 2012-09-18 (×8): 1000 mg via INTRAVENOUS
  Filled 2012-09-14 (×10): qty 200

## 2012-09-14 MED ORDER — NALOXONE HCL 0.4 MG/ML IJ SOLN: 0.4000 mg | INTRAMUSCULAR | Status: DC | PRN

## 2012-09-14 MED ORDER — DIPHENHYDRAMINE HCL 12.5 MG/5ML PO ELIX: 12.5000 mg | ORAL_SOLUTION | Freq: Four times a day (QID) | ORAL | Status: DC | PRN

## 2012-09-14 MED ORDER — EPHEDRINE SULFATE 50 MG/ML IJ SOLN
INTRAMUSCULAR | Status: DC | PRN
  Administered 2012-09-14 (×2): 5 mg via INTRAVENOUS

## 2012-09-14 MED ORDER — SODIUM CHLORIDE 0.9 % IJ SOLN: 3.0000 mL | INTRAMUSCULAR | Status: DC | PRN

## 2012-09-14 MED ORDER — LACTATED RINGERS IV SOLN
INTRAVENOUS | Status: DC | PRN
  Administered 2012-09-14 (×2): via INTRAVENOUS

## 2012-09-14 MED ORDER — OXYCODONE-ACETAMINOPHEN 5-325 MG PO TABS
1.0000 | ORAL_TABLET | ORAL | Status: DC | PRN
  Administered 2012-09-15 – 2012-09-17 (×3): 2 via ORAL
  Filled 2012-09-14 (×3): qty 2

## 2012-09-14 MED ORDER — HYDROMORPHONE HCL PF 1 MG/ML IJ SOLN
0.2500 mg | INTRAMUSCULAR | Status: DC | PRN
  Administered 2012-09-14: 0.5 mg via INTRAVENOUS

## 2012-09-14 MED ORDER — ACETAMINOPHEN 650 MG RE SUPP: 650.0000 mg | RECTAL | Status: DC | PRN

## 2012-09-14 MED ORDER — DIAZEPAM 5 MG PO TABS
10.0000 mg | ORAL_TABLET | Freq: Three times a day (TID) | ORAL | Status: DC | PRN
  Administered 2012-09-14: 10 mg via ORAL
  Filled 2012-09-14: qty 2

## 2012-09-14 MED ORDER — THROMBIN 5000 UNITS EX SOLR
CUTANEOUS | Status: DC | PRN
  Administered 2012-09-14 (×3): 5000 [IU] via TOPICAL

## 2012-09-14 MED ORDER — LIDOCAINE HCL 4 % MT SOLN
OROMUCOSAL | Status: DC | PRN
  Administered 2012-09-14: 4 mL via TOPICAL

## 2012-09-14 MED ORDER — VANCOMYCIN HCL 1000 MG IV SOLR
1000.0000 mg | Freq: Two times a day (BID) | INTRAVENOUS | Status: DC
  Filled 2012-09-14: qty 1000

## 2012-09-14 MED ORDER — MUPIROCIN 2 % EX OINT
TOPICAL_OINTMENT | CUTANEOUS | Status: AC
  Administered 2012-09-14: 1 via NASAL
  Filled 2012-09-14: qty 22

## 2012-09-14 MED ORDER — NALOXONE HCL 0.4 MG/ML IJ SOLN
0.4000 mg | INTRAMUSCULAR | Status: DC | PRN
  Administered 2012-09-14: 0.4 mg via INTRAVENOUS
  Filled 2012-09-14: qty 1

## 2012-09-14 MED ORDER — SODIUM CHLORIDE 0.9 % IJ SOLN: 9.0000 mL | INTRAMUSCULAR | Status: DC | PRN

## 2012-09-14 MED ORDER — LIDOCAINE HCL (CARDIAC) 20 MG/ML IV SOLN
INTRAVENOUS | Status: DC | PRN
  Administered 2012-09-14: 40 mg via INTRAVENOUS

## 2012-09-14 MED ORDER — ACETAMINOPHEN 10 MG/ML IV SOLN: 1000.0000 mg | Freq: Once | INTRAVENOUS | Status: DC | PRN

## 2012-09-14 MED ORDER — DICYCLOMINE HCL 10 MG PO CAPS
10.0000 mg | ORAL_CAPSULE | ORAL | Status: DC
  Filled 2012-09-14 (×3): qty 1

## 2012-09-14 MED ORDER — ACETAMINOPHEN 325 MG PO TABS
650.0000 mg | ORAL_TABLET | ORAL | Status: DC | PRN
  Administered 2012-09-14 – 2012-09-18 (×6): 650 mg via ORAL
  Filled 2012-09-14 (×6): qty 2

## 2012-09-14 MED ORDER — 0.9 % SODIUM CHLORIDE (POUR BTL) OPTIME
TOPICAL | Status: DC | PRN
  Administered 2012-09-14: 1000 mL

## 2012-09-14 MED ORDER — CHOLESTYRAMINE 4 G PO PACK
1.0000 | PACK | Freq: Two times a day (BID) | ORAL | Status: DC
  Administered 2012-09-14 – 2012-09-15 (×2): 1 via ORAL
  Filled 2012-09-14 (×11): qty 1

## 2012-09-14 MED ORDER — HYDROMORPHONE 0.3 MG/ML IV SOLN
INTRAVENOUS | Status: AC
  Administered 2012-09-14: 12:00:00
  Filled 2012-09-14: qty 25

## 2012-09-14 MED ORDER — HEPARIN SODIUM (PORCINE) 5000 UNIT/ML IJ SOLN
5000.0000 [IU] | Freq: Three times a day (TID) | INTRAMUSCULAR | Status: DC
Start: 2012-09-14 — End: 2012-09-19
  Administered 2012-09-14 – 2012-09-19 (×15): 5000 [IU] via SUBCUTANEOUS
  Filled 2012-09-14 (×18): qty 1

## 2012-09-14 MED ORDER — SODIUM CHLORIDE 0.9 % IV SOLN
INTRAVENOUS | Status: DC
  Administered 2012-09-14: 19:00:00 via INTRAVENOUS

## 2012-09-14 MED ORDER — INFLUENZA VIRUS VACC SPLIT PF IM SUSP: 0.5000 mL | INTRAMUSCULAR | Status: DC | PRN

## 2012-09-14 MED ORDER — ONDANSETRON HCL 4 MG/2ML IJ SOLN
INTRAMUSCULAR | Status: DC | PRN
  Administered 2012-09-14: 4 mg via INTRAVENOUS

## 2012-09-14 MED ORDER — DIPHENHYDRAMINE HCL 50 MG/ML IJ SOLN: 12.5000 mg | Freq: Four times a day (QID) | INTRAMUSCULAR | Status: DC | PRN

## 2012-09-14 MED ORDER — DIPHENHYDRAMINE HCL 50 MG/ML IJ SOLN
12.5000 mg | Freq: Four times a day (QID) | INTRAMUSCULAR | Status: DC | PRN
  Administered 2012-09-15 (×2): 12.5 mg via INTRAVENOUS
  Filled 2012-09-14 (×2): qty 1

## 2012-09-14 MED ORDER — VANCOMYCIN HCL IN DEXTROSE 1-5 GM/200ML-% IV SOLN
INTRAVENOUS | Status: AC
  Filled 2012-09-14: qty 200

## 2012-09-14 MED ORDER — HEMOSTATIC AGENTS (NO CHARGE) OPTIME
TOPICAL | Status: DC | PRN
  Administered 2012-09-14: 1 via TOPICAL

## 2012-09-14 MED ORDER — SERTRALINE HCL 50 MG PO TABS
50.0000 mg | ORAL_TABLET | Freq: Every day | ORAL | Status: DC
  Administered 2012-09-15 – 2012-09-19 (×5): 50 mg via ORAL
  Filled 2012-09-14 (×5): qty 1

## 2012-09-14 MED ORDER — HYDROMORPHONE 0.3 MG/ML IV SOLN
INTRAVENOUS | Status: DC
  Administered 2012-09-14: 22:00:00 via INTRAVENOUS
  Administered 2012-09-15: 2.1 mg via INTRAVENOUS
  Administered 2012-09-15 (×2): 0.9 mg via INTRAVENOUS

## 2012-09-14 MED ORDER — ONDANSETRON HCL 4 MG/2ML IJ SOLN: 4.0000 mg | Freq: Once | INTRAMUSCULAR | Status: DC | PRN

## 2012-09-14 MED ORDER — HYDROMORPHONE HCL PF 1 MG/ML IJ SOLN
INTRAMUSCULAR | Status: AC
  Filled 2012-09-14: qty 1

## 2012-09-14 MED ORDER — MENTHOL 3 MG MT LOZG: 1.0000 | LOZENGE | OROMUCOSAL | Status: DC | PRN

## 2012-09-14 MED ORDER — PROPOFOL 10 MG/ML IV BOLUS
INTRAVENOUS | Status: DC | PRN
  Administered 2012-09-14: 200 mg via INTRAVENOUS

## 2012-09-14 SURGICAL SUPPLY — 58 items
BENZOIN TINCTURE PRP APPL 2/3 (GAUZE/BANDAGES/DRESSINGS) ×2 IMPLANT
BLADE SURG ROTATE 9660 (MISCELLANEOUS) IMPLANT
BUR ACORN 6.0 (BURR) ×2 IMPLANT
BUR MATCHSTICK NEURO 3.0 LAGG (BURR) ×2 IMPLANT
CANISTER SUCTION 2500CC (MISCELLANEOUS) ×2 IMPLANT
CLOTH BEACON ORANGE TIMEOUT ST (SAFETY) ×2 IMPLANT
CONT SPEC 4OZ CLIKSEAL STRL BL (MISCELLANEOUS) ×2 IMPLANT
DERMABOND ADVANCED (GAUZE/BANDAGES/DRESSINGS) ×1
DERMABOND ADVANCED .7 DNX12 (GAUZE/BANDAGES/DRESSINGS) ×1 IMPLANT
DRAPE LAPAROTOMY 100X72X124 (DRAPES) ×2 IMPLANT
DRAPE MICROSCOPE LEICA (MISCELLANEOUS) ×2 IMPLANT
DRAPE POUCH INSTRU U-SHP 10X18 (DRAPES) ×2 IMPLANT
DRSG PAD ABDOMINAL 8X10 ST (GAUZE/BANDAGES/DRESSINGS) IMPLANT
DURAPREP 26ML APPLICATOR (WOUND CARE) ×2 IMPLANT
DURASEAL SPINE SEALANT 3ML (MISCELLANEOUS) ×2 IMPLANT
ELECT REM PT RETURN 9FT ADLT (ELECTROSURGICAL) ×2
ELECTRODE REM PT RTRN 9FT ADLT (ELECTROSURGICAL) ×1 IMPLANT
GAUZE SPONGE 4X4 16PLY XRAY LF (GAUZE/BANDAGES/DRESSINGS) IMPLANT
GLOVE BIOGEL M 8.0 STRL (GLOVE) ×2 IMPLANT
GLOVE BIOGEL PI IND STRL 8 (GLOVE) ×2 IMPLANT
GLOVE BIOGEL PI INDICATOR 8 (GLOVE) ×2
GLOVE ECLIPSE 7.5 STRL STRAW (GLOVE) ×2 IMPLANT
GLOVE EXAM NITRILE LRG STRL (GLOVE) ×2 IMPLANT
GLOVE EXAM NITRILE MD LF STRL (GLOVE) IMPLANT
GLOVE EXAM NITRILE XL STR (GLOVE) IMPLANT
GLOVE EXAM NITRILE XS STR PU (GLOVE) IMPLANT
GLOVE INDICATOR 8.5 STRL (GLOVE) ×4 IMPLANT
GOWN BRE IMP SLV AUR LG STRL (GOWN DISPOSABLE) ×2 IMPLANT
GOWN BRE IMP SLV AUR XL STRL (GOWN DISPOSABLE) ×4 IMPLANT
GOWN STRL REIN 2XL LVL4 (GOWN DISPOSABLE) ×2 IMPLANT
HEMOSTAT POWDER SURGIFOAM 1G (HEMOSTASIS) ×2 IMPLANT
KIT BASIN OR (CUSTOM PROCEDURE TRAY) ×2 IMPLANT
KIT ROOM TURNOVER OR (KITS) ×2 IMPLANT
NEEDLE HYPO 18GX1.5 BLUNT FILL (NEEDLE) IMPLANT
NEEDLE HYPO 21X1.5 SAFETY (NEEDLE) ×2 IMPLANT
NEEDLE HYPO 25X1 1.5 SAFETY (NEEDLE) ×2 IMPLANT
NEEDLE SPNL 20GX3.5 QUINCKE YW (NEEDLE) ×2 IMPLANT
NS IRRIG 1000ML POUR BTL (IV SOLUTION) ×2 IMPLANT
PACK LAMINECTOMY NEURO (CUSTOM PROCEDURE TRAY) ×2 IMPLANT
PAD ARMBOARD 7.5X6 YLW CONV (MISCELLANEOUS) ×6 IMPLANT
PATTIES SURGICAL .5 X1 (DISPOSABLE) ×2 IMPLANT
RUBBERBAND STERILE (MISCELLANEOUS) ×4 IMPLANT
SPONGE GAUZE 4X4 12PLY (GAUZE/BANDAGES/DRESSINGS) ×2 IMPLANT
SPONGE LAP 4X18 X RAY DECT (DISPOSABLE) IMPLANT
SPONGE SURGIFOAM ABS GEL SZ50 (HEMOSTASIS) ×2 IMPLANT
STAPLER SKIN PROX WIDE 3.9 (STAPLE) ×2 IMPLANT
STRIP CLOSURE SKIN 1/2X4 (GAUZE/BANDAGES/DRESSINGS) ×2 IMPLANT
SUT VIC AB 0 CT1 18XCR BRD8 (SUTURE) ×1 IMPLANT
SUT VIC AB 0 CT1 8-18 (SUTURE) ×1
SUT VIC AB 2-0 CP2 18 (SUTURE) ×2 IMPLANT
SUT VIC AB 3-0 SH 8-18 (SUTURE) ×2 IMPLANT
SYR 20CC LL (SYRINGE) ×2 IMPLANT
SYR 20ML ECCENTRIC (SYRINGE) ×2 IMPLANT
SYR 5ML LL (SYRINGE) IMPLANT
TAPE CLOTH SURG 4X10 WHT LF (GAUZE/BANDAGES/DRESSINGS) ×2 IMPLANT
TOWEL OR 17X24 6PK STRL BLUE (TOWEL DISPOSABLE) ×2 IMPLANT
TOWEL OR 17X26 10 PK STRL BLUE (TOWEL DISPOSABLE) ×2 IMPLANT
WATER STERILE IRR 1000ML POUR (IV SOLUTION) ×2 IMPLANT

## 2012-09-14 NOTE — Progress Notes (Signed)
Op note [phone]

## 2012-09-14 NOTE — Transfer of Care (Signed)
Immediate Anesthesia Transfer of Care Note  Patient: Christopher [name]  Procedure(s) Performed: Procedure(s) (LRB) with comments: LUMBAR LAMINECTOMY/DECOMPRESSION MICRODISCECTOMY 1 LEVEL (Right) - Right Lumbar three-four Diskectomy  Patient Location: PACU  Anesthesia Type:General  Level of Consciousness: awake, alert  and oriented  Airway & Oxygen Therapy: Patient Spontanous Breathing and Patient connected to nasal cannula oxygen  Post-op Assessment: Report given to PACU RN, Post -op Vital signs reviewed and stable and Patient moving all extremities X 4  Post vital signs: Reviewed and stable  Complications: No apparent anesthesia complications

## 2012-09-14 NOTE — Progress Notes (Signed)
C/o incisional pain, no headache. Foley to be inserted. Hob down to 3 to 5 degrees.

## 2012-09-14 NOTE — H&P (Signed)
Christopher [name] is an 57 y.o. male.   Chief Complaint: right leg pain HPI: patient who has been complaining of lumbar pain with radiation to the right leg down to the knee but not lower. Getting worse. He had 2 level lumbar fusionin the past.  Past Medical History  Diagnosis Date  . Hyperlipidemia   . Anxiety   . Depression   . GERD (gastroesophageal reflux disease)   . Sleep apnea     uses CIPAP machine at night  . Heart murmur     Years ago- not now  . Headache     after surgery  . Complication of anesthesia     pt had a hard time being able to move after spinal anesthesia , 3-4 hours  . PONV (postoperative nausea and vomiting)   . Arthritis     Past Surgical History  Procedure Date  . Knee arthroscopy     left knee  . Knee arthroscopy     right knee   . Spinal fusion     x 2  . Neck fusion   . Back surgery   . Colonoscopy 06/27/2011    Procedure: COLONOSCOPY;  Surgeon: Malissa Hippo, MD;  Location: [facility] SUITE;  Service: Endoscopy;  Laterality: N/A;  9:00 / Pt to be here at 9am for 10:45 procedure, benign polyps removed  . Appendectomy   . Hernia repair     umbilical hernia  . Cardiac catheterization   . Chondroplasty 08/15/2011    Procedure: CHONDROPLASTY;  Surgeon: Fuller Canada, MD;  Location: [facility] ORS;  Service: Orthopedics;  Laterality: Left;    Family History  Problem Relation Age of Onset  . Diabetes    . Lung disease    . Arthritis    . Anesthesia problems Neg Hx   . Hypotension Neg Hx   . Malignant hyperthermia Neg Hx   . Pseudochol deficiency Neg Hx    Social History:  reports that he has been smoking Cigarettes.  He has a 40 pack-year smoking history. He does not have any smokeless tobacco history on file. He reports that he does not drink alcohol or use illicit drugs.  Allergies:  Allergies  Allergen Reactions  . Celebrex (Celecoxib) Itching and Swelling    All over  . Codeine Nausea And Vomiting    Extreme stomach pain. This includes  anything with the derivative of codeine in it.  . Cortisone Swelling  . Doxycycline Swelling    Made tongue turn black   . Oxycodone-Acetaminophen Itching    Can tolerate with benadryl   . Prednisone Swelling  . Relafen (Nabumetone) Swelling    Medications Prior to Admission  Medication Sig Dispense Refill  . ALPRAZolam (XANAX) 0.5 MG tablet Take 0.5 mg by mouth 3 (three) times daily.        . diazepam (VALIUM) 10 MG tablet Take 10 mg by mouth every 6 (six) hours as needed.      . pantoprazole (PROTONIX) 40 MG tablet Take 40 mg by mouth daily.       . SEROQUEL 50 MG tablet Take 50 mg by mouth at bedtime.       . sertraline (ZOLOFT) 50 MG tablet Take 50 mg by mouth daily.       . temazepam (RESTORIL) 30 MG capsule Take 30 mg by mouth at bedtime.       . cholestyramine (QUESTRAN) 4 G packet Take 1 packet by mouth 2 (two) times daily between meals.  60 each  2  . dicyclomine (BENTYL) 10 MG capsule Take 10 mg by mouth every other day.       . diphenhydrAMINE (BENADRYL) 25 mg capsule Take 25 mg by mouth every 4 (four) hours as needed. For itching/allergy      . HYDROcodone-acetaminophen (NORCO/VICODIN) 5-325 MG per tablet Take 1 tablet by mouth every 4 (four) hours as needed.        Results for orders placed during the hospital encounter of 09/14/12 (from the past 48 hour(s))  CBC     Status: Abnormal   Collection Time   09/14/12  6:58 AM      Component Value Range Comment   WBC 8.8  4.0 - 10.5 K/uL    RBC 4.72  4.22 - 5.81 MIL/uL    Hemoglobin 13.5  13.0 - 17.0 g/dL    HCT 14.7 (*) 82.9 - 52.0 %    MCV 82.4  78.0 - 100.0 fL    MCH 28.6  26.0 - 34.0 pg    MCHC 34.7  30.0 - 36.0 g/dL    RDW 56.2  13.0 - 86.5 %    Platelets 195  150 - 400 K/uL    No results found.  Review of Systems  Constitutional: Negative.   HENT: Positive for neck pain.   Eyes: Negative.   Respiratory: Negative.   Cardiovascular: Negative.   Gastrointestinal: Negative.   Genitourinary: Negative.     Musculoskeletal: Positive for back pain.  Neurological: Positive for sensory change and focal weakness.  Endo/Heme/Allergies: Negative.   Psychiatric/Behavioral: Negative.     Blood pressure 133/81, pulse 54, temperature 98.2 F (36.8 C), temperature source Oral, resp. rate 18, height 5\' 6"  (1.676 m), weight 95.255 kg (210 lb), SpO2 96.00%. Physical Exam hent, nl. Neck, anterior scar. Cv, nl . r Lungs some wet rales. Abdomen, soft. Extremities, nl. NEURO  Absent right knee reflex. Femoral stretch maneuver positive in right. MYELO LUMBAR, SHOWS a herniated disc at l34 to the right. Fusion wnl  Assessment/Plan To have right l34 discectomy. Patient and wife aware of risks and benefits.  Burlene Montecalvo M 09/14/2012, 9:20 AM

## 2012-09-14 NOTE — Anesthesia Preprocedure Evaluation (Addendum)
Anesthesia Evaluation  Patient identified by MRN, date of birth, ID band Patient awake    Reviewed: Allergy & Precautions, H&P , NPO status , Patient's Chart, lab work & pertinent test results  Airway Mallampati: II      Dental  (+) Edentulous Upper and Partial Lower   Pulmonary  breath sounds clear to auscultation        Cardiovascular Rhythm:Regular Rate:Normal     Neuro/Psych    GI/Hepatic   Endo/Other    Renal/GU      Musculoskeletal   Abdominal (+) + obese,   Peds  Hematology   Anesthesia Other Findings   Reproductive/Obstetrics                           Anesthesia Physical Anesthesia Plan  ASA: III  Anesthesia Plan: General   Post-op Pain Management:    Induction: Intravenous  Airway Management Planned: Oral ETT  Additional Equipment:   Intra-op Plan:   Post-operative Plan:   Informed Consent: I have reviewed the patients History and Physical, chart, labs and discussed the procedure including the risks, benefits and alternatives for the proposed anesthesia with the patient or authorized representative who has indicated his/her understanding and acceptance.   Dental advisory given  Plan Discussed with: CRNA, Surgeon and Anesthesiologist  Anesthesia Plan Comments:        Anesthesia Quick Evaluation

## 2012-09-14 NOTE — Anesthesia Postprocedure Evaluation (Signed)
  Anesthesia Post-op Note  Patient: Christopher [name]  Procedure(s) Performed: Procedure(s) (LRB) with comments: LUMBAR LAMINECTOMY/DECOMPRESSION MICRODISCECTOMY 1 LEVEL (Right) - Right Lumbar three-four Diskectomy  Patient Location: PACU  Anesthesia Type:General  Level of Consciousness: awake, alert  and oriented  Airway and Oxygen Therapy: Patient Spontanous Breathing and Patient connected to nasal cannula oxygen  Post-op Pain: mild  Post-op Assessment: Post-op Vital signs reviewed and Patient's Cardiovascular Status Stable  Post-op Vital Signs: stable  Complications: No apparent anesthesia complications

## 2012-09-14 NOTE — Preoperative (Signed)
Beta Blockers   Reason not to administer Beta Blockers:Not Applicable 

## 2012-09-15 MED ORDER — SODIUM CHLORIDE 0.9 % IJ SOLN: 9.0000 mL | INTRAMUSCULAR | Status: DC | PRN

## 2012-09-15 MED ORDER — DIPHENHYDRAMINE HCL 50 MG/ML IJ SOLN: 12.5000 mg | Freq: Four times a day (QID) | INTRAMUSCULAR | Status: DC | PRN

## 2012-09-15 MED ORDER — INFLUENZA VIRUS VACC SPLIT PF IM SUSP
0.5000 mL | INTRAMUSCULAR | Status: AC
  Administered 2012-09-16: 0.5 mL via INTRAMUSCULAR
  Filled 2012-09-15: qty 0.5

## 2012-09-15 MED ORDER — DIPHENHYDRAMINE HCL 12.5 MG/5ML PO ELIX: 12.5000 mg | ORAL_SOLUTION | Freq: Four times a day (QID) | ORAL | Status: DC | PRN

## 2012-09-15 MED ORDER — NALOXONE HCL 0.4 MG/ML IJ SOLN: 0.4000 mg | INTRAMUSCULAR | Status: DC | PRN

## 2012-09-15 MED ORDER — MORPHINE SULFATE (PF) 1 MG/ML IV SOLN
INTRAVENOUS | Status: DC
  Administered 2012-09-15: 3 mg via INTRAVENOUS
  Administered 2012-09-15: 14:00:00 via INTRAVENOUS
  Administered 2012-09-15: 3 mg via INTRAVENOUS
  Administered 2012-09-16 (×2): 1.5 mg via INTRAVENOUS
  Administered 2012-09-16: 4.5 mg via INTRAVENOUS
  Administered 2012-09-16 (×2): 3 mg via INTRAVENOUS
  Administered 2012-09-17: 4.5 mg via INTRAVENOUS
  Administered 2012-09-17: 0.25 mg via INTRAVENOUS
  Administered 2012-09-17: 2.48 mg via INTRAVENOUS
  Filled 2012-09-15 (×2): qty 25

## 2012-09-15 MED ORDER — ONDANSETRON HCL 4 MG/2ML IJ SOLN: 4.0000 mg | Freq: Four times a day (QID) | INTRAMUSCULAR | Status: DC | PRN

## 2012-09-15 NOTE — Op Note (Signed)
NAMEMarland Kitchen  MARKELL, SCIASCIA NO.:  [id number]  MEDICAL RECORD NO.:  [id number]  LOCATION:  4N02C                        FACILITY:  [facility]  PHYSICIAN:  Hilda Lias, M.D.   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  Status post L4-5, L5-S1 fusion.  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  Status post L4-5, L5-S1 fusion.  PROCEDURE:  Right L3-L4 lysis of adhesions, laminotomy, L3-4 right diskectomy, video microscope.  SURGEON:  Hilda Lias, M.D.  ASSISTANT:  Reinaldo Meeker, M.D.  CLINICAL HISTORY:  Christopher [name] is a 57 year old gentleman who in the past underwent fusion at L4-5, L5-S1.  I have seen him in my office several occasions, but lately he had been complaining of pain going to the right leg which was getting worse.  The pain going to the left knee associated with weakness of the quadriceps.  X-rays showed that the fusion at L4-5 and 5 quite solid.  At the level 3, 4 he has a herniated disk central to the right.  Surgery was advised.  I spoke with him and his wife, and they knew the risk with the surgery including the possibility of CSF leak infection, no improvement whatsoever, need of further surgery which might require fusion.  PROCEDURE:  The patient was taken to the OR and he was positioned in a prone manner.  The skin was cleaned with Betadine and DuraPrep.  Drapes were applied.  It was difficult to feel any spinous process.  We had to do incision resecting the upper part of the previous scar.  Incision was carried down through the skin, subcutaneous tissue, through a thick adipose tissue straight down until we were able to feel the spinous process.  X-ray showed that indeed we were at the level 3 4.  With the microscope, we  opened the lower lamina entry.  There was no epidural space that we can see and we had to spend at least half an hour dissecting the disk excision went from the dura mater.  Once this was accomplished we found that indeed he has a herniated disk mostly right and central.  We entered the disk space.  We started doing diskectomy lateral 1st and then medially.  A large amount of disk were removed. There was a piece of this attached to the anterior part of the dura mater and there was some small amount of CSF that came around.  We continued our dissection  after we did a total gross diskectomy.  We went back again and we did Valsalva up to 40 twice and there was no evidence of any fluid coming.  Nevertheless we use surgical glue into the area.  From then on, the area was irrigated and the wound was closed with Vicryl and staple.          ______________________________ Hilda Lias, M.D.     EB/MEDQ  D:  09/14/2012  T:  09/15/2012  Job:  [id number]

## 2012-09-15 NOTE — Progress Notes (Signed)
Patient ID: Christopher [name], male   DOB: [birth year], 57 y.o.   MRN: [id number] Having quite a bit of headache and muscle spasms. No weakness. Continue flat in bed

## 2012-09-15 NOTE — Clinical Documentation Improvement (Signed)
TEST TES TESTE TES TEST ES T ES TE S T EST SE T EST  THIS DOCUMENT IS NOT A PERMANENT PART OF THE MEDICAL RECORD  TO RESPOND TO THE THIS QUERY, FOLLOW THE INSTRUCTIONS BELOW:  1. If needed, update documentation for the patient's encounter via the notes activity.  2. Access this query again and click edit on the [employer].  3. After updating, or not, click F2 to complete all highlighted (required) fields concerning your review. Select "additional documentation in the medical record" OR "no additional documentation provided".  4. Click Sign note button.  5. The deficiency will fall out of your InBasket *Please let us know if you are not able to complete this workflow by phone or e-mail (listed below).  Please update your documentation within the medical record to reflect your response to this query.                                                                                   09/15/12  Dear Dr. Marton Redwood  In a better effort to capture your patient's severity of illness, reflect appropriate length of stay and utilization of resources, a review of the medical record has revealed the following indicators.    Based on your clinical judgment, please clarify and document in a progress note and/or discharge summary the clinical condition associated with the following supporting information:  In responding to this query please exercise your independent judgment.  The fact that a query is asked, does not imply that any particular answer is desired or expected.  Abnormal findings (laboratory, x-ray, pathologic, and other diagnostic results) are not coded and reported unless the physician indicates their clinical significance.   The medical record reflects the following clinical findings, please clarify the diagnostic and/or clinical significance:      Possible Clinical Conditions?                                 Supporting Information:  ____________________                                         Lab Test: ____________________  ____________________                                        Risk Factors: _________________  ____________________                                        Patient Values: ________________                       Normal Values :________________  Other Condition___________________                Treatment: ____________________  Cannot Clinically Determine_________   TEST TEST TEST TEST TEST   Thank You,  Saul Fordyce  Clinical Documentation Specialist: [phone] Pager  Health Information Management [place]

## 2012-09-15 NOTE — Clinical Documentation Improvement (Signed)
Progress Notes signed by Boykin Reaper, OT at 09/15/12 1029   Christopher [name] TEST TEST TEST TEST

## 2012-09-15 NOTE — Progress Notes (Signed)
OT Cancellation Note  Patient Details Name: ANSON PEDDIE MRN: [id number] DOB: Oct 11, 1955   Cancelled Treatment:    Reason Eval/Treat Not Completed: Medical issues which prohibited therapy - noted Bedrest orders.  MD, please clarify when pt. May be OOB.  Thanks!    Bushra Denman M Arienne Gartin, OTR/L 09/15/2012, 10:28 AM

## 2012-09-16 ENCOUNTER — Encounter (HOSPITAL_COMMUNITY): Payer: Self-pay | Admitting: Neurosurgery

## 2012-09-16 NOTE — Progress Notes (Signed)
OT Cancellation Note  Patient Details Name: Christopher [name] MRN: [id number] DOB: 01/19/1956   Cancelled Treatment:    Reason Eval/Treat Not Completed: Medical issues which prohibited therapy.  Pt on bedrest until tomorrow morning, will continue to follow.  Evern Bio 09/16/2012, 12:16 PM [phone]

## 2012-09-16 NOTE — Progress Notes (Signed)
Patient ID: Christopher [name], male   DOB: 01/24/1956, 57 y.o.   MRN: [id number] No headache, wound dry. To get oob in am. Spoke with wife

## 2012-09-17 MED ORDER — DIAZEPAM 5 MG PO TABS
10.0000 mg | ORAL_TABLET | Freq: Four times a day (QID) | ORAL | Status: DC | PRN
  Administered 2012-09-17 – 2012-09-19 (×6): 10 mg via ORAL
  Filled 2012-09-17 (×6): qty 2

## 2012-09-17 MED ORDER — MORPHINE SULFATE 2 MG/ML IJ SOLN: 2.0000 mg | INTRAMUSCULAR | Status: DC | PRN

## 2012-09-17 MED ORDER — HYDROCODONE-ACETAMINOPHEN 5-325 MG PO TABS
1.0000 | ORAL_TABLET | Freq: Four times a day (QID) | ORAL | Status: DC | PRN
  Administered 2012-09-17 – 2012-09-18 (×3): 2 via ORAL
  Administered 2012-09-18: 1 via ORAL
  Administered 2012-09-19 (×2): 2 via ORAL
  Filled 2012-09-17 (×6): qty 2

## 2012-09-17 MED ORDER — OXYCODONE HCL 5 MG PO TABS
15.0000 mg | ORAL_TABLET | ORAL | Status: DC | PRN
  Administered 2012-09-17: 15 mg via ORAL
  Filled 2012-09-17 (×2): qty 3

## 2012-09-17 MED ORDER — FLEET ENEMA 7-19 GM/118ML RE ENEM: 1.0000 | ENEMA | Freq: Every day | RECTAL | Status: DC | PRN

## 2012-09-17 NOTE — Progress Notes (Signed)
i was called  By the physical therapist to let me know that Christopher [name] once he was out of bed he started to buckle the right leg with a lot pf spasms. S he feel that he can not go home tomorrow . She will be evaluating him over the weekend .  We are going to cancel his discharge

## 2012-09-17 NOTE — Evaluation (Signed)
Physical Therapy Evaluation Patient Details Name: Christopher [name] MRN: [id number] DOB: Feb 19, 1956 Today's Date: 09/17/2012 Time:  -     PT Assessment / Plan / Recommendation Clinical Impression  Pt s/p L3-4 disckectomy. Pt with extended period of bedrest following surgery limiting functional mobility. Pt also with significant R knee buckling with weightbearing. Spoke with MD regarding discharge tomorrow, feel as though pt is not safe to d/c home tomororw secondary to significant right sided weakness. Pt will benefit from skilled PT in the acute care setting in order to maximize functional mobility, strength and safety prior to d/c home    PT Assessment  Patient needs continued PT services    Follow Up Recommendations  Home health PT;Supervision/Assistance - 24 hour    Does the patient have the potential to tolerate intense rehabilitation      Barriers to Discharge        Equipment Recommendations  None recommended by PT    Recommendations for Other Services     Frequency Min 5X/week    Precautions / Restrictions Precautions Precautions: Back Precaution Booklet Issued: Yes (comment) Precaution Comments: Pt instructed in back precautions Required Braces or Orthoses: Spinal Brace Spinal Brace: Lumbar corset;Applied in sitting position Restrictions Weight Bearing Restrictions: No   Pertinent Vitals/Pain Pain in R hip 8/10. Pain meds given during session.       Mobility  Bed Mobility Bed Mobility: Rolling Left;Left Sidelying to Sit;Sitting - Scoot to [place] of Bed;Sit to Sidelying Left Rolling Left: 4: Min assist Left Sidelying to Sit: 3: Mod assist;2: Max assist;HOB flat Sitting - Scoot to [place] of Bed: 4: Min assist Sit to Sidelying Left: 4: Min assist;HOB flat Details for Bed Mobility Assistance: Pt requires cues for log rolling and technique.  Requires assist for sidelying to sit due to pain Rt hip and difficulty weight bearing through Rt. hip Transfers Transfers: Sit to  Stand;Stand to Sit;Stand Pivot Transfers Sit to Stand: 1: +2 Total assist;From bed Sit to Stand: Patient Percentage: 60% Stand to Sit: 1: +2 Total assist;To bed;With upper extremity assist Stand to Sit: Patient Percentage: 60% Stand Pivot Transfers: 1: +2 Total assist Stand Pivot Transfers: Patient Percentage: 60% Details for Transfer Assistance: Manual facilitation at R knee to maintain knee extension secondary to pt with R knee buckling in weight bearing stance. Cues for hand placement. Pt unabel to tolerate static standing with R spasms. Ambulation/Gait Ambulation/Gait Assistance: Not tested (comment)    Shoulder Instructions     Exercises     PT Diagnosis: Difficulty walking;Acute pain  PT Problem List: Decreased strength;Decreased activity tolerance;Decreased mobility;Decreased knowledge of use of DME;Decreased safety awareness;Decreased knowledge of precautions;Pain PT Treatment Interventions: DME instruction;Gait training;Stair training;Functional mobility training;Therapeutic activities;Patient/family education   PT Goals Acute Rehab PT Goals PT Goal Formulation: With patient/family Time For Goal Achievement: 09/24/12 Potential to Achieve Goals: Fair Pt will go Supine/Side to Sit: with modified independence PT Goal: Supine/Side to Sit - Progress: Goal set today Pt will go Sit to Stand: with modified independence PT Goal: Sit to Stand - Progress: Goal set today Pt will go Stand to Sit: with supervision PT Goal: Stand to Sit - Progress: Goal set today Pt will Transfer Bed to Chair/Chair to Bed: with supervision PT Transfer Goal: Bed to Chair/Chair to Bed - Progress: Goal set today Pt will Ambulate: 51 - 150 feet;with supervision;with least restrictive assistive device PT Goal: Ambulate - Progress: Goal set today Pt will Go Up / Down Stairs: 6-9 stairs;with supervision;with rail(s) PT  Goal: Up/Down Stairs - Progress: Goal set today Additional Goals Additional Goal #1: Pt  will be able to verbalize and maintian 3/3 back precautions PT Goal: Additional Goal #1 - Progress: Goal set today  Visit Information  Last PT Received On: 09/17/12 Assistance Needed: +1    Subjective Data      Prior Functioning  Home Living Lives With: Spouse Available Help at Discharge: Family;Available 24 hours/day Type of Home: Mobile home Home Access: Stairs to enter Entrance Stairs-Number of Steps: 6 Entrance Stairs-Rails: Right;Left;None Home Layout: One level Bathroom Shower/Tub: Forensic scientist: Standard Bathroom Accessibility: Yes How Accessible: Accessible via walker Home Adaptive Equipment: Walker - rolling;Straight cane;Shower chair with back Prior Function Level of Independence: Independent with assistive device(s) Able to Take Stairs?: Yes Driving: Yes Vocation: On disability Comments: walks with cane Communication Communication: No difficulties Dominant Hand: Right    Cognition  Overall Cognitive Status: Appears within functional limits for tasks assessed/performed Arousal/Alertness: Awake/alert Orientation Level: Appears intact for tasks assessed Behavior During Session: [facility] for tasks performed    Extremity/Trunk Assessment Right Upper Extremity Assessment RUE ROM/Strength/Tone: Within functional levels RUE Coordination: WFL - gross/fine motor Left Upper Extremity Assessment LUE ROM/Strength/Tone: Within functional levels LUE Coordination: WFL - gross/fine motor Right Lower Extremity Assessment RLE ROM/Strength/Tone: Deficits;Unable to fully assess;Due to pain RLE ROM/Strength/Tone Deficits: pt unable to tolerate sitting or lying on R side secondary to pain. Unable to assess MMT. Upon standing and weightbearing, pt with R knee buckling, unable to control without assistance RLE Sensation: WFL - Light Touch Left Lower Extremity Assessment LLE ROM/Strength/Tone: Within functional levels LLE Sensation: WFL - Light Touch     Balance    End of Session PT - End of Session Equipment Utilized During Treatment: Back brace;Gait belt Activity Tolerance: Patient limited by pain Patient left: in chair;with call bell/phone within reach;with family/visitor present Nurse Communication: Mobility status  GP     Milana Kidney 09/17/2012, 4:07 PM  09/17/2012 Milana Kidney DPT PAGER: [phone] OFFICE: [phone]

## 2012-09-17 NOTE — Discharge Summary (Signed)
Physician Discharge Summary  Patient ID: Christopher [name] MRN: [id number] DOB/AGE: [birth year] 55 y.o.  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 S/p l45 l5s1 fusion  Discharge Diagnoses: same   Discharged Condition:ambulating. No leg pain  Hospital Course: surgery  Consultsnone:   Significant Diagnostic Studies:lumbar myelogram  Treatments:l3-4 discectomy  Discharge Exam: Blood pressure 102/60, pulse 59, temperature 97.6 F (36.4 C), temperature source Oral, resp. rate 16, height 5\' 6"  (1.676 m), weight 95.255 kg (210 lb), SpO2 95.00%. Wound dry. No headache incisional pain present  Disposition: patient to be seen by pt. Discharge in am on lortab10/325 and valium. Pharmacy called. To be seen by me in 10 days to have his staples removed. Spoke with him and wife     Medication List     As of 09/17/2012  2:30 PM    ASK your doctor about these medications         ALPRAZolam 0.5 MG tablet   Commonly known as: XANAX   Take 0.5 mg by mouth 3 (three) times daily.      cholestyramine 4 G packet   Commonly known as: QUESTRAN   Take 1 packet by mouth 2 (two) times daily between meals.      diazepam 10 MG tablet   Commonly known as: VALIUM   Take 10 mg by mouth every 6 (six) hours as needed.      dicyclomine 10 MG capsule   Commonly known as: BENTYL   Take 10 mg by mouth every other day.      diphenhydrAMINE 25 mg capsule   Commonly known as: BENADRYL   Take 25 mg by mouth every 4 (four) hours as needed. For itching/allergy      HYDROcodone-acetaminophen 5-325 MG per tablet   Commonly known as: NORCO/VICODIN   Take 1 tablet by mouth every 4 (four) hours as needed.      pantoprazole 40 MG tablet   Commonly known as: PROTONIX   Take 40 mg by mouth daily.      SEROQUEL 50 MG tablet   Generic drug: QUEtiapine   Take 50 mg by mouth at bedtime.      sertraline 50 MG tablet   Commonly known as: ZOLOFT   Take 50 mg  by mouth daily.      temazepam 30 MG capsule   Commonly known as: RESTORIL   Take 30 mg by mouth at bedtime.         Signed: Karn Cassis 09/17/2012, 2:30 PM

## 2012-09-17 NOTE — Progress Notes (Signed)
Patient ID: Christopher [name], male   DOB: 1956/05/11, 57 y.o.   MRN: [id number] Sitting on his own since yesterday. No headache no weakness

## 2012-09-17 NOTE — Progress Notes (Signed)
OT Cancellation Note  Patient Details Name: Christopher [name] MRN: [id number] DOB: Nov 03, 1955   Cancelled Treatment:    Reason Eval/Treat Not Completed: Medical issues which prohibited therapy.  Awaiting removal of bedrest order.    Evern Bio 09/17/2012, 11:18 AM

## 2012-09-17 NOTE — Evaluation (Signed)
Occupational Therapy Evaluation Patient Details Name: Christopher [name] MRN: [id number] DOB: December 04, 1955 Today's Date: 09/17/2012 Time: 4098-1191 OT Time Calculation (min): 26 min  OT Assessment / Plan / Recommendation Clinical Impression  This 57 y.o. male admitted for PLIF.  Pt with CSF leak post op resulting in prolonged bedrest.  Rt. hip pain limited pt's ability to sit upright, or perform BADLs this date.  Eval limited to bed mobility, education on back precautions, and transfer to chair.  He will benefit from OT to maximize safety and independence with BADLs to allow pt. to return to min A level     OT Assessment  Patient needs continued OT Services    Follow Up Recommendations  Home health OT;Supervision/Assistance - 24 hour    Barriers to Discharge None    Equipment Recommendations  3 in 1 bedside comode    Recommendations for Other Services    Frequency  Min 2X/week    Precautions / Restrictions Precautions Precautions: Back Precaution Booklet Issued: Yes (comment) Precaution Comments: Pt instructed in back precautions Required Braces or Orthoses: Spinal Brace Spinal Brace: Lumbar corset;Applied in sitting position Restrictions Weight Bearing Restrictions: No       ADL  Eating/Feeding: Independent Where Assessed - Eating/Feeding: Bed level;Chair Grooming: Wash/dry hands;Wash/dry face;Teeth care;Set up Where Assessed - Grooming: Supported sitting Upper Body Bathing: Minimal assistance Where Assessed - Upper Body Bathing: Supported sitting Lower Body Bathing: Maximal assistance Where Assessed - Lower Body Bathing: Supported sit to stand Upper Body Dressing: Minimal assistance Where Assessed - Upper Body Dressing: Supported sitting Lower Body Dressing: +1 Total assistance Where Assessed - Lower Body Dressing: Supported sit to Pharmacist, hospital: +2 Total assistance Toilet Transfer: Patient Percentage: 70% Statistician Method: Retail banker: Other (comment) Nurse, children's) Toileting - Clothing Manipulation and Hygiene: +1 Total assistance Where Assessed - Toileting Clothing Manipulation and Hygiene: Standing Transfers/Ambulation Related to ADLs: Pt with c/o severe Rt. hip pain with difficulty weight bearing through Rt. hip and pain causing knee buckling.  Only able to move sit to stand and transfer to recliner ADL Comments: Pt unable to access LEs.  Pt instructed in back precautions and requires mod cues to adhere to them    OT Diagnosis: Generalized weakness;Acute pain  OT Problem List: Decreased strength;Decreased activity tolerance;Decreased knowledge of use of DME or AE;Pain;Obesity;Decreased knowledge of precautions OT Treatment Interventions: Self-care/ADL training;DME and/or AE instruction;Patient/family education;Therapeutic activities   OT Goals Acute Rehab OT Goals OT Goal Formulation: With patient/family Time For Goal Achievement: 10/01/12 Potential to Achieve Goals: Good ADL Goals Pt Will Perform Grooming: with supervision;Standing at sink ADL Goal: Grooming - Progress: Goal set today Pt Will Perform Lower Body Bathing: with supervision;with adaptive equipment;Sit to stand from bed;Sit to stand from chair ADL Goal: Lower Body Bathing - Progress: Goal set today Pt Will Perform Lower Body Dressing: with supervision;Sit to stand from chair;Sit to stand from bed;with adaptive equipment ADL Goal: Lower Body Dressing - Progress: Goal set today Pt Will Transfer to Toilet: with supervision;Ambulation;3-in-1;Maintaining back safety precautions ADL Goal: Toilet Transfer - Progress: Goal set today Pt Will Perform Toileting - Clothing Manipulation: with supervision;Standing ADL Goal: Toileting - Clothing Manipulation - Progress: Goal set today Pt Will Perform Toileting - Hygiene: with supervision;with adaptive equipment;Sit to stand from 3-in-1/toilet ADL Goal: Toileting - Hygiene - Progress: Goal set today Pt Will  Perform Tub/Shower Transfer: with min assist;Ambulation;with DME;Maintaining back safety precautions ADL Goal: Tub/Shower Transfer - Progress: Goal set today Additional ADL  Goal #1: Pt will be independent with back precautions during ADLs ADL Goal: Additional Goal #1 - Progress: Goal set today  Visit Information  Last OT Received On: 09/17/12 Assistance Needed: +1    Subjective Data  Subjective: "I can't stand it.  I can't sit on that hip" Patient Stated Goal: "to go home"   Prior Functioning     Home Living Lives With: Spouse Available Help at Discharge: Family;Available 24 hours/day Type of Home: Mobile home Home Access: Stairs to enter Entrance Stairs-Number of Steps: 6 Entrance Stairs-Rails: Right;Left;None Home Layout: One level Bathroom Shower/Tub: Forensic scientist: Standard Bathroom Accessibility: Yes How Accessible: Accessible via walker Home Adaptive Equipment: Walker - rolling;Straight cane;Shower chair with back Prior Function Level of Independence: Independent with assistive device(s) Able to Take Stairs?: Yes Driving: Yes Vocation: On disability Comments: walks with cane Communication Communication: No difficulties Dominant Hand: Right         Vision/Perception     Cognition  Overall Cognitive Status: Appears within functional limits for tasks assessed/performed Arousal/Alertness: Awake/alert Orientation Level: Appears intact for tasks assessed Behavior During Session: [facility] for tasks performed    Extremity/Trunk Assessment Right Upper Extremity Assessment RUE ROM/Strength/Tone: Within functional levels RUE Coordination: WFL - gross/fine motor Left Upper Extremity Assessment LUE ROM/Strength/Tone: Within functional levels LUE Coordination: WFL - gross/fine motor Right Lower Extremity Assessment RLE ROM/Strength/Tone: Deficits;Unable to fully assess;Due to pain RLE ROM/Strength/Tone Deficits: pt unable to tolerate sitting  or lying on R side secondary to pain. Unable to assess MMT. Upon standing and weightbearing, pt with R knee buckling, unable to control without assistance RLE Sensation: WFL - Light Touch Left Lower Extremity Assessment LLE ROM/Strength/Tone: Within functional levels LLE Sensation: WFL - Light Touch     Mobility Bed Mobility Bed Mobility: Rolling Left;Left Sidelying to Sit;Sitting - Scoot to [place] of Bed;Sit to Sidelying Left Rolling Left: 4: Min assist Left Sidelying to Sit: 3: Mod assist;2: Max assist;HOB flat Sitting - Scoot to [place] of Bed: 4: Min assist Sit to Sidelying Left: 4: Min assist;HOB flat Details for Bed Mobility Assistance: Pt requires cues for log rolling and technique.  Requires assist for sidelying to sit due to pain Rt hip and difficulty weight bearing through Rt. hip Transfers Transfers: Sit to Stand;Stand to Sit Sit to Stand: 1: +2 Total assist;From bed Sit to Stand: Patient Percentage: 60% Stand to Sit: 1: +2 Total assist;To bed;With upper extremity assist Stand to Sit: Patient Percentage: 60% Details for Transfer Assistance: Manual facilitation at R knee to maintain knee extension secondary to pt with R knee buckling in weight bearing stance. Cues for hand placement. Pt unabel to tolerate static standing with R spasms.     Shoulder Instructions     Exercise     Balance     End of Session OT - End of Session Equipment Utilized During Treatment: Gait belt;Back brace Activity Tolerance: Patient limited by pain Patient left: in chair;with call bell/phone within reach;with family/visitor present Nurse Communication: Mobility status;Patient requests pain meds  GO     Burlin Mcnair M 09/17/2012, 4:07 PM

## 2012-09-17 NOTE — Progress Notes (Signed)
Patient had some headache, 7/10  around 0400. PRN tylenol given and did help, pain down to 4/10. Patient on flat bed rest. Will continue to monitor.

## 2012-09-18 MED ORDER — INFLUENZA VIRUS VACC SPLIT PF IM SUSP
0.5000 mL | INTRAMUSCULAR | Status: DC
  Filled 2012-09-18: qty 0.5

## 2012-09-18 NOTE — Progress Notes (Signed)
Rehab Admissions Coordinator Note:  Patient was screened by Clois Dupes for appropriateness for an Inpatient Acute Rehab Consult.  [employer] will not approve an inpt rehab admission for this diagnosis. At this time, we are recommending Skilled Nursing Facility.  Clois Dupes 09/18/2012, 1:08 PM  I can be reached at [phone].

## 2012-09-18 NOTE — Progress Notes (Signed)
Pt has been quiet today, seldom voiced complaints of pain, this evening states the medication isn't cutting the pain. Reminded to  He can have it more often than he has requested it,

## 2012-09-18 NOTE — Progress Notes (Signed)
Postop day 4. Pain recently well-controlled. Patient seems to think that his right leg feels a little better.  Afebrile. Vital stable. Awake and alert. Oriented and appropriate. Motor exam reveals continued weakness of his right quadriceps muscle group grating out at 3/5. Patient was able to stand with therapy today and take a few steps with a walker but mobility is still such that home discharge not feasible.  Progressing slowly. Continue therapy. Discharge home on mobility better.

## 2012-09-18 NOTE — Progress Notes (Signed)
Occupational Therapy Treatment Patient Details Name: Christopher [name] MRN: [id number] DOB: 08/28/1956 Today's Date: 09/18/2012 Time: 6962-9528 OT Time Calculation (min): 30 min  OT Assessment / Plan / Recommendation Comments on Treatment Session Pt s/p L4-5 L5-S1 PLIF with x 4 day bedrest due to CSF leak. Pt demonstrates increased mobility and activity tolerance compared to 09/17/12 Ot evaluation. Pt currently requires (A) for all adls due to pain and Rt LE buckling with mobility. Pt educated on back precautions again and recalling at the end of session.     Follow Up Recommendations  CIR    Barriers to Discharge       Equipment Recommendations  3 in 1 bedside comode (RW)    Recommendations for Other Services Rehab consult  Frequency Min 2X/week   Plan Discharge plan needs to be updated    Precautions / Restrictions Precautions Precautions: Back Precaution Comments: pt recalling 2 out 3 back precautions. pt provided teach back education on back precautions. Required Braces or Orthoses: Spinal Brace Spinal Brace: Lumbar corset;Applied in sitting position Restrictions Weight Bearing Restrictions: No   Pertinent Vitals/Pain Reports pain 5-8 out 10 range during session in Rt Hip/ buttock area Pt reports pain improved from 09/17/12    ADL  Grooming: Wash/dry hands;Wash/dry face;Teeth care;Set up Where Assessed - Grooming: Supported sitting Upper Body Dressing: Minimal assistance Where Assessed - Upper Body Dressing: Supported sitting Lower Body Dressing: +1 Total assistance Where Assessed - Lower Body Dressing: Supported sitting Toilet Transfer: +2 Total assistance Toilet Transfer: Patient Percentage: 80% Toilet Transfer Method: Sit to Barista: Raised toilet seat with arms (or 3-in-1 over toilet) Equipment Used: Gait belt;Rolling walker Transfers/Ambulation Related to ADLs: Pt provided Rt LE blocking due to buckling. pt with severe shooting pain x1 during  session requiring sitting in chair immediately. Pt with slow progression of ambulation and sit <>stand. Pt requires max v/c RW, Rt LE , LT LE with PT  facilitating Rt LE advancement and blocking. Pt with circumduction to Lt LE advancement. pt with delayed response to alternating advancement of BIL LE. ADL Comments: Pt sitting on EOB and don back brace independently unsupported. Pt completed static sit<>stand during session x3. Pt demonstrates ability to weight shift Rt and Lt static standing this session with decr buckling. pt continues to buckle and decr quad activation on Rt LE. Pt reports pain and mobility improved since surgery compared to PTA. Pt does not feel at this time that he could d/c home safely with wife (A). pt would like to consider CIR if approved. Pt's Rt LE buckle is affecting all aspect of adls. Pt was unable to complete ambulation to rest room this session. Pt with pain in RT hip / buttock area with sit<>stand transfers.     OT Diagnosis:    OT Problem List:   OT Treatment Interventions:     OT Goals Acute Rehab OT Goals OT Goal Formulation: With patient/family Time For Goal Achievement: 10/01/12 Potential to Achieve Goals: Good ADL Goals Pt Will Perform Grooming: with supervision;Standing at sink ADL Goal: Grooming - Progress: Progressing toward goals Pt Will Perform Lower Body Bathing: with supervision;with adaptive equipment;Sit to stand from bed;Sit to stand from chair Pt Will Perform Lower Body Dressing: with supervision;Sit to stand from chair;Sit to stand from bed;with adaptive equipment Pt Will Transfer to Toilet: with supervision;Ambulation;3-in-1;Maintaining back safety precautions ADL Goal: Toilet Transfer - Progress: Progressing toward goals Pt Will Perform Toileting - Clothing Manipulation: with supervision;Standing ADL Goal: Toileting - Clothing  Manipulation - Progress: Progressing toward goals Pt Will Perform Toileting - Hygiene: with supervision;with adaptive  equipment;Sit to stand from 3-in-1/toilet ADL Goal: Toileting - Hygiene - Progress: Progressing toward goals Pt Will Perform Tub/Shower Transfer: with min assist;Ambulation;with DME;Maintaining back safety precautions Additional ADL Goal #1: Pt will be independent with back precautions during ADLs ADL Goal: Additional Goal #1 - Progress: Progressing toward goals  Visit Information  Last OT Received On: 09/18/12 Assistance Needed: +2 (+2 for safety)    Subjective Data      Prior Functioning       Cognition  Overall Cognitive Status: Appears within functional limits for tasks assessed/performed Arousal/Alertness: Awake/alert Orientation Level: Appears intact for tasks assessed Behavior During Session: [facility] for tasks performed    Mobility  Shoulder Instructions Bed Mobility Bed Mobility: Not assessed Transfers Sit to Stand: 1: +2 Total assist;With upper extremity assist;From bed Sit to Stand: Patient Percentage: 80% Stand to Sit: 1: +2 Total assist;With upper extremity assist;To chair/3-in-1 Stand to Sit: Patient Percentage: 70% (decr control with descend to chair) Details for Transfer Assistance: Pt sitting on Lt side and left hip due to pain in rt hip. pt provided education on sitting with hips bil and even.       Exercises      Balance Balance Balance Assessed: Yes Static Standing Balance Static Standing - Balance Support: Bilateral upper extremity supported;During functional activity Static Standing - Level of Assistance: 1: +2 Total assist (80% requires Rt LE blocked ) Static Standing - Comment/# of Minutes: 1 minutes   End of Session OT - End of Session Activity Tolerance: Patient tolerated treatment well Patient left: in chair;with call bell/phone within reach Nurse Communication: Mobility status;Precautions  GO     Lucile Shutters 09/18/2012, 10:03 AM Pager: [phone]

## 2012-09-18 NOTE — Progress Notes (Signed)
Physical Therapy Treatment Patient Details Name: Christopher [name] MRN: [id number] DOB: 1955-09-25 Today's Date: 09/18/2012 Time: 4098-1191 PT Time Calculation (min): 32 min  PT Assessment / Plan / Recommendation Comments on Treatment Session  Pt showed significant improvements since evaluation yesterday although still requiring assist x 2 for support due to R knee buckling. Pt also showing ataxic gait pattern this session most notably in LLE. Recommending CIR for further rehab prior to d/c home    Follow Up Recommendations  CIR;Supervision/Assistance - 24 hour     Does the patient have the potential to tolerate intense rehabilitation     Barriers to Discharge        Equipment Recommendations  None recommended by PT    Recommendations for Other Services Rehab consult  Frequency Min 5X/week   Plan Frequency remains appropriate;Discharge plan needs to be updated    Precautions / Restrictions Precautions Precautions: Back Precaution Comments: pt recalling 2 out 3 back precautions. pt provided teach back education on back precautions. Required Braces or Orthoses: Spinal Brace Spinal Brace: Lumbar corset;Applied in sitting position Restrictions Weight Bearing Restrictions: No   Pertinent Vitals/Pain Pain in R hip and buttocks 6/10    Mobility  Bed Mobility Bed Mobility: Not assessed Transfers Transfers: Sit to Stand;Stand to Sit;Stand Pivot Transfers Sit to Stand: 1: +2 Total assist;With upper extremity assist;From bed Sit to Stand: Patient Percentage: 80% Stand to Sit: 1: +2 Total assist;With upper extremity assist;To chair/3-in-1 Stand to Sit: Patient Percentage: 70% (decr control with descend to chair) Details for Transfer Assistance: Pt sitting on Lt side and left hip due to pain in rt hip. pt provided education on sitting with hips bil and even. Ambulation/Gait Ambulation/Gait Assistance: 1: +2 Total assist Ambulation/Gait: Patient Percentage: 80% Ambulation Distance  (Feet): 20 Feet Assistive device: Rolling walker Ambulation/Gait Assistance Details: +2 assistance for safety. One person for support at trunk, other for support through R knee to prevent buckling. Manual facilitation at R knee to maintain knee control both in swing and stance. Pt with ataxic left gait pattern, difficulty with avoiding circumduction as well as difficulty with placement of LLE. VC for proper gait pattern and safety throughout.  Gait Pattern: Step-to pattern;Ataxic;Right flexed knee in stance;Left foot flat;Left circumduction;Decreased hip/knee flexion - right;Decreased stride length;Decreased weight shift to right Gait velocity: slow Stairs: No    Exercises General Exercises - Lower Extremity Long Arc Quad: AROM;Strengthening;Right;10 reps;Other (comment) (eccentric control for 5 seconds) Straight Leg Raises: AROM;Strengthening;Right;10 reps   PT Diagnosis:    PT Problem List:   PT Treatment Interventions:     PT Goals Acute Rehab PT Goals PT Goal: Supine/Side to Sit - Progress: Progressing toward goal PT Goal: Sit to Stand - Progress: Progressing toward goal PT Goal: Stand to Sit - Progress: Progressing toward goal PT Transfer Goal: Bed to Chair/Chair to Bed - Progress: Progressing toward goal PT Goal: Ambulate - Progress: Progressing toward goal Additional Goals PT Goal: Additional Goal #1 - Progress: Progressing toward goal  Visit Information  Last PT Received On: 09/18/12 Assistance Needed: +2 (+2 for safety) PT/OT Co-Evaluation/Treatment: Yes    Subjective Data      Cognition  Overall Cognitive Status: Appears within functional limits for tasks assessed/performed Arousal/Alertness: Awake/alert Orientation Level: Appears intact for tasks assessed Behavior During Session: [facility] for tasks performed    Balance  Balance Balance Assessed: Yes Static Standing Balance Static Standing - Balance Support: Bilateral upper extremity supported;During functional  activity Static Standing - Level of Assistance: 1: +  2 Total assist (80% requires Rt LE blocked ) Static Standing - Comment/# of Minutes: 1 minutes  End of Session PT - End of Session Equipment Utilized During Treatment: Back brace;Gait belt Activity Tolerance: Patient tolerated treatment well Patient left: in chair;with call bell/phone within reach;with family/visitor present Nurse Communication: Mobility status   GP     Milana Kidney 09/18/2012, 10:23 AM

## 2012-09-18 NOTE — Progress Notes (Signed)
Pt. Has CPAP set up in room at bedside. Pt. Stated that he would place himself on CPAP when ready for bed. Pt. Was made aware to call if he needed assistance.

## 2012-09-19 NOTE — Progress Notes (Signed)
Physical Therapy Treatment Patient Details Name: Christopher [name] MRN: [id number] DOB: [birth year] Today's Date: 09/19/2012 Time: 0813-0829 PT Time Calculation (min): 16 min  PT Assessment / Plan / Recommendation Comments on Treatment Session  Pt with drastic improvements in RLE strength and tolerance this session. Pt able to ambulate with supervision assistnace only as well as complete stairs. Only one episode of buckling on stairs in which pt was able to correct. Plan for pt to d/c home today    Follow Up Recommendations  Home health PT;Supervision for mobility/OOB     Does the patient have the potential to tolerate intense rehabilitation     Barriers to Discharge        Equipment Recommendations  Other (comment) (3-1)    Recommendations for Other Services    Frequency Min 5X/week   Plan Frequency remains appropriate;Discharge plan needs to be updated    Precautions / Restrictions Precautions Precautions: Back Precaution Comments: pt able to recall and maintain 3/3 back precuations Required Braces or Orthoses: Spinal Brace Spinal Brace: Lumbar corset Restrictions Weight Bearing Restrictions: No   Pertinent Vitals/Pain Pain in back 4/10. Pain meds given prior to session    Mobility  Bed Mobility Bed Mobility: Sit to Sidelying Left Rolling Left: 6: Modified independent (Device/Increase time) Transfers Transfers: Sit to Stand;Stand to Sit;Stand Pivot Transfers Sit to Stand: 5: Supervision;With upper extremity assist;From chair/3-in-1 Stand to Sit: 5: Supervision;With upper extremity assist;To bed Details for Transfer Assistance: No difficulties sitting. Cues for safe hand placement Ambulation/Gait Ambulation/Gait Assistance: 5: Supervision Ambulation Distance (Feet): 150 Feet Assistive device: Rolling walker Ambulation/Gait Assistance Details: No buckling of knee throughout session, pt able to maintain with knee in extension during stance phase. Cues for safe distance to  RW Gait Pattern: Step-to pattern;Decreased hip/knee flexion - right;Decreased stride length;Decreased weight shift to right Gait velocity: slow Stairs: Yes Stairs Assistance: 5: Supervision;4: Min assist Stairs Assistance Details (indicate cue type and reason): Supervision with one episode of buckling whjen pt descended with strong leg, min assist for support. Cues for safe technique leading with strong and descending with weak leg.  Stair Management Technique: Two rails;Step to pattern;Forwards Number of Stairs: 5     Exercises     PT Diagnosis:    PT Problem List:   PT Treatment Interventions:     PT Goals Acute Rehab PT Goals PT Goal: Supine/Side to Sit - Progress: Met PT Goal: Sit to Stand - Progress: Progressing toward goal PT Goal: Stand to Sit - Progress: Progressing toward goal PT Transfer Goal: Bed to Chair/Chair to Bed - Progress: Met PT Goal: Ambulate - Progress: Met PT Goal: Up/Down Stairs - Progress: Progressing toward goal Additional Goals PT Goal: Additional Goal #1 - Progress: Met  Visit Information  Last PT Received On: 09/19/12 Assistance Needed: +1    Subjective Data      Cognition  Overall Cognitive Status: Appears within functional limits for tasks assessed/performed Arousal/Alertness: Awake/alert Orientation Level: Appears intact for tasks assessed Behavior During Session: [facility] for tasks performed    Balance     End of Session PT - End of Session Equipment Utilized During Treatment: Back brace;Gait belt Activity Tolerance: Patient tolerated treatment well Patient left: in bed;with call bell/phone within reach;with family/visitor present Nurse Communication: Mobility status   GP     Milana Kidney 09/19/2012, 9:32 AM

## 2012-09-19 NOTE — Progress Notes (Signed)
NCM spoke to pt's wife and states he had HH in the past. She is agreeable to [facility] for [facility] PT. Requested 3n1 for home. Unit CC ordered with [facility]. Placed [facility] on d/c instructions. Will request soc from [facility] on 1/20. Isidoro Donning RN CCM Case Mgmt phone [phone]

## 2012-09-19 NOTE — Progress Notes (Signed)
Pt and wife given discharge instructions, reviewed. No questions at this time.

## 2012-09-19 NOTE — Discharge Summary (Signed)
Physician Discharge Summary  Patient ID: Christopher [name] MRN: [id number] DOB/AGE: Sep 21, 1955 57 y.o.  Admit date: 09/14/2012 Discharge date: 09/19/2012  Admission Diagnoses: Lumbar stenosis    Discharge Diagnoses: Same   Discharged Condition: [facility] Course: The patient was admitted on 09/14/2012 and taken to the operating room where the patient underwent lumbar decompression and fusion. The patient tolerated the procedure well and was taken to the recovery room and then to the  floor in stable condition. The hospital course was routine except that he had some right leg weakness post operatively. He improved some with physical and occupational therapy to the point he was safe with ambulation. The wound remained clean dry and intact. Pt had appropriate back soreness. The patient remained afebrile with stable vital signs, and tolerated a regular diet. The patient continued to increase activities, and pain was well controlled with oral pain medications.   Consults: None  Significant Diagnostic Studies:  Results for orders placed during the hospital encounter of 09/14/12  CBC      Component Value Range   WBC 8.8  4.0 - 10.5 K/uL   RBC 4.72  4.22 - 5.81 MIL/uL   Hemoglobin 13.5  13.0 - 17.0 g/dL   HCT 40.9 (*) 81.1 - 91.4 %   MCV 82.4  78.0 - 100.0 fL   MCH 28.6  26.0 - 34.0 pg   MCHC 34.7  30.0 - 36.0 g/dL   RDW 78.2  95.6 - 21.3 %   Platelets 195  150 - 400 K/uL  SURGICAL PCR SCREEN      Component Value Range   MRSA, PCR POSITIVE (*) NEGATIVE   Staphylococcus aureus POSITIVE (*) NEGATIVE  CBC      Component Value Range   WBC 12.7 (*) 4.0 - 10.5 K/uL   RBC 4.53  4.22 - 5.81 MIL/uL   Hemoglobin 13.4  13.0 - 17.0 g/dL   HCT 08.6 (*) 57.8 - 46.9 %   MCV 83.2  78.0 - 100.0 fL   MCH 29.6  26.0 - 34.0 pg   MCHC 35.5  30.0 - 36.0 g/dL   RDW 62.9  52.8 - 41.3 %   Platelets 172  150 - 400 K/uL  CREATININE, SERUM      Component Value Range   Creatinine, Ser 1.06  0.50 - 1.35  mg/dL   GFR calc non Af Amer 78 (*) >90 mL/min   GFR calc Af Amer >90  >90 mL/min    Dg Lumbar Spine 2-3 Views  09/14/2012  *RADIOLOGY REPORT*  Clinical Data: Back pain  LUMBAR SPINE - 2-3 VIEW  Comparison: Multiple priors.  Findings: Portable cross-table film #1 demonstrates a blunt instrument from a posterior approach directed most closely toward the pedicle of L3.  Film #2 demonstrates a slightly angled probe within the L3-4 disc space.  IMPRESSION: As above.   Original Report Authenticated By: Davonna Belling, M.D.    Ct Lumbar Spine W Contrast  09/02/2012  *RADIOLOGY REPORT*  Clinical Data:  Low back pain.  Right leg pain.   MYELOGRAM INJECTION  Technique:  Informed consent was obtained from the patient prior to the procedure, including potential complications of headache, allergy, infection and pain.  A timeout procedure was performed. With the patient prone, the lower back was prepped with Betadine. 1% Lidocaine was used for local anesthesia.  Lumbar puncture was performed at the right L2-3 level using a 22 gauge needle with return of clear CSF.  18 ml of Omnipaque  180was injected into the subarachnoid space .  IMPRESSION: Successful injection of  intrathecal contrast for myelography.  MYELOGRAM LUMBAR  Technique:  Following injection of intrathecal Omnipaque contrast, spine imaging in multiple projections was performed using fluoroscopy.  Fluoroscopy Time: 57 seconds .  Comparison:  Radiography 08/23/2012.  MRI 02/02/2010.  Findings: L1-2:  Small anterior extradural defect.  No gross neural compression.  L2-3:  Normal interspace.  L3-4:  Moderate anterior extradural defect to.  Narrowing of the lateral recesses bilaterally.  L4 -sacrum:  Previous discectomy and fusion.  Pedicle screws and posterior rods at L4-5.  Fusion is probably solid.  No motion occurs with flexion and extension.  No stenosis in the region.  Standing lateral flexion extension films show some rocking motion at L3-4 but no subluxation.   IMPRESSION: Small anterior extradural defect at L1-2.  No apparent stenosis.  Moderate anterior extradural defect at L3-4.  Some narrowing of the lateral recesses.  Rocking motion with flexion and extension.  No evidence of motion in the fusion segment from L4 to the sacrum. No apparent stenosis.  CT MYELOGRAPHY LUMBAR SPINE  Technique:  CT imaging of the lumbar spine was performed after intrathecal contrast administration.  Multiplanar CT image reconstructions were also generated.  Findings:  T12-L1:  Normal interspace.  L1-2:  Shallow broad-based disc herniation indents the thecal sac but does not cause gross neural compression.  L2-3:  Normal interspace.  L3-4:  Broad-based disc herniation, slightly right paracentral prominent.  Mild facet and ligamentous hypertrophy.  Stenosis at this level could cause neural compression, particularly on the right.  There is some caudal migration of disc material.  L4-5:  Previous discectomy and fusion.  Pedicle screws and posterior rods.  Fusion is probably solid.  No evidence of motion. No stenosis.  L5-S1:  Distant fusion which is solid.  Wide patency of the canal and foramina.  Screw shadows within the sacrum.  Sacroiliac joints show mild osteoarthritis.  IMPRESSION: L1-2:  Shallow disc protrusion without gross neural compression.  L3-4:  Broad-based disc herniation, prominent in the right paracentral region.  Stenosis of the lateral recesses right worse than left.  Some caudal migration of disc material behind L4.  Satisfactory appearance of the fusion segment from L4 to the sacrum.   Original Report Authenticated By: Paulina Fusi, M.D.    Dg Myelogram Lumbar  09/02/2012  *RADIOLOGY REPORT*  Clinical Data:  Low back pain.  Right leg pain.   MYELOGRAM INJECTION  Technique:  Informed consent was obtained from the patient prior to the procedure, including potential complications of headache, allergy, infection and pain.  A timeout procedure was performed. With the patient  prone, the lower back was prepped with Betadine. 1% Lidocaine was used for local anesthesia.  Lumbar puncture was performed at the right L2-3 level using a 22 gauge needle with return of clear CSF.  18 ml of Omnipaque 180was injected into the subarachnoid space .  IMPRESSION: Successful injection of  intrathecal contrast for myelography.  MYELOGRAM LUMBAR  Technique:  Following injection of intrathecal Omnipaque contrast, spine imaging in multiple projections was performed using fluoroscopy.  Fluoroscopy Time: 57 seconds .  Comparison:  Radiography 08/23/2012.  MRI 02/02/2010.  Findings: L1-2:  Small anterior extradural defect.  No gross neural compression.  L2-3:  Normal interspace.  L3-4:  Moderate anterior extradural defect to.  Narrowing of the lateral recesses bilaterally.  L4 -sacrum:  Previous discectomy and fusion.  Pedicle screws and posterior rods at L4-5.  Fusion  is probably solid.  No motion occurs with flexion and extension.  No stenosis in the region.  Standing lateral flexion extension films show some rocking motion at L3-4 but no subluxation.  IMPRESSION: Small anterior extradural defect at L1-2.  No apparent stenosis.  Moderate anterior extradural defect at L3-4.  Some narrowing of the lateral recesses.  Rocking motion with flexion and extension.  No evidence of motion in the fusion segment from L4 to the sacrum. No apparent stenosis.  CT MYELOGRAPHY LUMBAR SPINE  Technique:  CT imaging of the lumbar spine was performed after intrathecal contrast administration.  Multiplanar CT image reconstructions were also generated.  Findings:  T12-L1:  Normal interspace.  L1-2:  Shallow broad-based disc herniation indents the thecal sac but does not cause gross neural compression.  L2-3:  Normal interspace.  L3-4:  Broad-based disc herniation, slightly right paracentral prominent.  Mild facet and ligamentous hypertrophy.  Stenosis at this level could cause neural compression, particularly on the right.  There  is some caudal migration of disc material.  L4-5:  Previous discectomy and fusion.  Pedicle screws and posterior rods.  Fusion is probably solid.  No evidence of motion. No stenosis.  L5-S1:  Distant fusion which is solid.  Wide patency of the canal and foramina.  Screw shadows within the sacrum.  Sacroiliac joints show mild osteoarthritis.  IMPRESSION: L1-2:  Shallow disc protrusion without gross neural compression.  L3-4:  Broad-based disc herniation, prominent in the right paracentral region.  Stenosis of the lateral recesses right worse than left.  Some caudal migration of disc material behind L4.  Satisfactory appearance of the fusion segment from L4 to the sacrum.   Original Report Authenticated By: Paulina Fusi, M.D.     Antibiotics:  Anti-infectives     Start     Dose/Rate Route Frequency Ordered Stop   09/14/12 2200   vancomycin (VANCOCIN) 1,000 mg in sodium chloride 0.9 % 250 mL IVPB  Status:  Discontinued        1,000 mg 250 mL/hr over 60 Minutes Intravenous Every 12 hours 09/14/12 1009 09/14/12 1326   09/14/12 2200   vancomycin (VANCOCIN) IVPB 1000 mg/200 mL premix  Status:  Discontinued        1,000 mg 200 mL/hr over 60 Minutes Intravenous Every 12 hours 09/14/12 1326 09/18/12 1004   09/14/12 1006   vancomycin (VANCOCIN) 1 GM/200ML IVPB     Comments: RATCLIFF, ESTHER: cabinet override         09/14/12 1006 09/14/12 2214   09/14/12 0600   ceFAZolin (ANCEF) IVPB 2 g/50 mL premix        2 g 100 mL/hr over 30 Minutes Intravenous On call to O.R. 09/13/12 1413 09/14/12 0952          Discharge Exam: Blood pressure 114/71, pulse 88, temperature 98.3 F (36.8 C), temperature source Oral, resp. rate 17, height 5\' 6"  (1.676 m), weight 95.255 kg (210 lb), SpO2 100.00%. Neurologic: Grossly normal except for some right quadricep weakness Incision okay  Discharge Medications:     Medication List     As of 09/19/2012  9:00 AM    TAKE these medications         ALPRAZolam 0.5 MG  tablet   Commonly known as: XANAX   Take 0.5 mg by mouth 3 (three) times daily.      cholestyramine 4 G packet   Commonly known as: QUESTRAN   Take 1 packet by mouth 2 (two) times daily between meals.  diazepam 10 MG tablet   Commonly known as: VALIUM   Take 10 mg by mouth every 6 (six) hours as needed.      dicyclomine 10 MG capsule   Commonly known as: BENTYL   Take 10 mg by mouth every other day.      diphenhydrAMINE 25 mg capsule   Commonly known as: BENADRYL   Take 25 mg by mouth every 4 (four) hours as needed. For itching/allergy      HYDROcodone-acetaminophen 5-325 MG per tablet   Commonly known as: NORCO/VICODIN   Take 1 tablet by mouth every 4 (four) hours as needed.      pantoprazole 40 MG tablet   Commonly known as: PROTONIX   Take 40 mg by mouth daily.      SEROQUEL 50 MG tablet   Generic drug: QUEtiapine   Take 50 mg by mouth at bedtime.      sertraline 50 MG tablet   Commonly known as: ZOLOFT   Take 50 mg by mouth daily.      temazepam 30 MG capsule   Commonly known as: RESTORIL   Take 30 mg by mouth at bedtime.        Disposition: Home  Final Dx: Lumbar decompression and fusion      Discharge Orders    Future Orders Please Complete By Expires   Diet - low sodium heart healthy      Increase activity slowly      Call MD for:  temperature >100.4      Call MD for:  persistant nausea and vomiting      Call MD for:  severe uncontrolled pain      Call MD for:  redness, tenderness, or signs of infection (pain, swelling, redness, odor or green/yellow discharge around incision site)      Call MD for:  difficulty breathing, headache or visual disturbances            Signed: Labrandon Knoch S 09/19/2012, 9:00 AM

## 2012-09-23 ENCOUNTER — Encounter (HOSPITAL_COMMUNITY): Payer: Self-pay | Admitting: Emergency Medicine

## 2012-09-23 ENCOUNTER — Emergency Department (HOSPITAL_COMMUNITY)
Admission: EM | Admit: 2012-09-23 | Discharge: 2012-09-23 | Disposition: A | Discharge status: Home / Self Care | Payer: Medicare Other | Attending: Emergency Medicine | Admitting: Emergency Medicine

## 2012-09-23 DIAGNOSIS — M545 Low back pain, unspecified: Secondary | ICD-10-CM | POA: Insufficient documentation

## 2012-09-23 DIAGNOSIS — G8918 Other acute postprocedural pain: Secondary | ICD-10-CM | POA: Insufficient documentation

## 2012-09-23 DIAGNOSIS — G4730 Sleep apnea, unspecified: Secondary | ICD-10-CM | POA: Insufficient documentation

## 2012-09-23 DIAGNOSIS — R011 Cardiac murmur, unspecified: Secondary | ICD-10-CM | POA: Insufficient documentation

## 2012-09-23 DIAGNOSIS — E785 Hyperlipidemia, unspecified: Secondary | ICD-10-CM | POA: Insufficient documentation

## 2012-09-23 DIAGNOSIS — F329 Major depressive disorder, single episode, unspecified: Secondary | ICD-10-CM | POA: Insufficient documentation

## 2012-09-23 DIAGNOSIS — F17200 Nicotine dependence, unspecified, uncomplicated: Secondary | ICD-10-CM | POA: Insufficient documentation

## 2012-09-23 DIAGNOSIS — R51 Headache: Secondary | ICD-10-CM | POA: Insufficient documentation

## 2012-09-23 DIAGNOSIS — L509 Urticaria, unspecified: Secondary | ICD-10-CM

## 2012-09-23 DIAGNOSIS — F411 Generalized anxiety disorder: Secondary | ICD-10-CM | POA: Insufficient documentation

## 2012-09-23 DIAGNOSIS — Z8739 Personal history of other diseases of the musculoskeletal system and connective tissue: Secondary | ICD-10-CM | POA: Insufficient documentation

## 2012-09-23 DIAGNOSIS — K219 Gastro-esophageal reflux disease without esophagitis: Secondary | ICD-10-CM | POA: Insufficient documentation

## 2012-09-23 DIAGNOSIS — Z79899 Other long term (current) drug therapy: Secondary | ICD-10-CM | POA: Insufficient documentation

## 2012-09-23 DIAGNOSIS — F3289 Other specified depressive episodes: Secondary | ICD-10-CM | POA: Insufficient documentation

## 2012-09-23 MED ORDER — SODIUM CHLORIDE 0.9 % IV SOLN
Freq: Once | INTRAVENOUS | Status: AC
  Administered 2012-09-23: 09:00:00 via INTRAVENOUS

## 2012-09-23 MED ORDER — HYDROXYZINE HCL 50 MG/ML IM SOLN: 50.0000 mg | Freq: Once | INTRAMUSCULAR | Status: DC

## 2012-09-23 MED ORDER — HYDROXYZINE HCL 25 MG PO TABS
50.0000 mg | ORAL_TABLET | Freq: Once | ORAL | Status: AC
  Administered 2012-09-23: 50 mg via ORAL
  Filled 2012-09-23: qty 2

## 2012-09-23 MED ORDER — HYDROXYZINE HCL 25 MG PO TABS
25.0000 mg | ORAL_TABLET | Freq: Once | ORAL | Status: AC
  Administered 2012-09-23: 25 mg via ORAL
  Filled 2012-09-23: qty 1

## 2012-09-23 MED ORDER — HYDROXYZINE HCL 25 MG PO TABS: 25.0000 mg | ORAL_TABLET | Freq: Four times a day (QID) | ORAL | Status: DC

## 2012-09-23 MED ORDER — FAMOTIDINE IN NACL 20-0.9 MG/50ML-% IV SOLN
20.0000 mg | Freq: Once | INTRAVENOUS | Status: AC
  Administered 2012-09-23: 20 mg via INTRAVENOUS
  Filled 2012-09-23: qty 50

## 2012-09-23 NOTE — ED Notes (Signed)
Patient unable to walk to bathroom.  Bedside toilet given.

## 2012-09-23 NOTE — ED Provider Notes (Signed)
History     CSN: [id number]  Arrival date & time 09/23/12  1308   First MD Initiated Contact with Patient 09/23/12 0813      Chief Complaint  Patient presents with  . Rash    (Consider location/radiation/quality/duration/timing/severity/associated sxs/prior treatment) HPI Comments: Patient states that he underwent back surgery for a" herniated disc" January 14. 2 days ago the patient states that he noticed a rash with welts and hives all over his body. He denies any new medications. He did have a change in his pain medication as far as the dose but not the type of medication. The patient's wife denies any new foods or drinks. There has been some problem with diarrhea since the patient has been receiving IV antibiotics in the hospital but no high fevers, no blood in the stools, and no other problems with the exception of the diarrhea. Patient presents at this time because he states he just cannot take the itching any longer.    The history is provided by the patient and the spouse.    Past Medical History  Diagnosis Date  . Hyperlipidemia   . Anxiety   . Depression   . GERD (gastroesophageal reflux disease)   . Sleep apnea     uses CIPAP machine at night  . Heart murmur     Years ago- not now  . Headache     after surgery  . Complication of anesthesia     pt had a hard time being able to move after spinal anesthesia , 3-4 hours  . PONV (postoperative nausea and vomiting)   . Arthritis     Past Surgical History  Procedure Date  . Knee arthroscopy     left knee  . Knee arthroscopy     right knee   . Spinal fusion     x 2  . Neck fusion   . Back surgery   . Colonoscopy 06/27/2011    Procedure: COLONOSCOPY;  Surgeon: Malissa Hippo, MD;  Location: [facility];  Service: Endoscopy;  Laterality: N/A;  9:00 / Pt to be here at 9am for 10:45 procedure, benign polyps removed  . Appendectomy   . Hernia repair     umbilical hernia  . Cardiac catheterization   .  Chondroplasty 08/15/2011    Procedure: CHONDROPLASTY;  Surgeon: Fuller Canada, MD;  Location: [facility];  Service: Orthopedics;  Laterality: Left;  . Lumbar laminectomy/decompression microdiscectomy 09/14/2012    Procedure: LUMBAR LAMINECTOMY/DECOMPRESSION MICRODISCECTOMY 1 LEVEL;  Surgeon: Karn Cassis, MD;  Location: [facility];  Service: Neurosurgery;  Laterality: Right;  Right Lumbar three-four Diskectomy    Family History  Problem Relation Age of Onset  . Diabetes    . Lung disease    . Arthritis    . Anesthesia problems Neg Hx   . Hypotension Neg Hx   . Malignant hyperthermia Neg Hx   . Pseudochol deficiency Neg Hx     History  Substance Use Topics  . Smoking status: Current Every Day Smoker -- 1.0 packs/day for 40 years    Types: Cigarettes  . Smokeless tobacco: Not on file  . Alcohol Use: No      Review of Systems  Constitutional: Negative for activity change.       All ROS Neg except as noted in HPI  HENT: Negative for nosebleeds and neck pain.   Eyes: Negative for photophobia and discharge.  Respiratory: Negative for cough, shortness of breath and wheezing.   Cardiovascular:  Negative for chest pain and palpitations.  Gastrointestinal: Negative for abdominal pain and blood in stool.  Genitourinary: Negative for dysuria, frequency and hematuria.  Musculoskeletal: Positive for back pain and arthralgias.  Skin: Positive for rash.       Hives  Neurological: Positive for headaches. Negative for dizziness, seizures and speech difficulty.  Psychiatric/Behavioral: Negative for hallucinations and confusion. The patient is nervous/anxious.     Allergies  Celebrex; Codeine; Cortisone; Doxycycline; Oxycodone-acetaminophen; Prednisone; and Relafen  Home Medications   Current Outpatient Rx  Name  Route  Sig  Dispense  Refill  . ALPRAZOLAM 0.5 MG PO TABS   Oral   Take 0.5 mg by mouth 3 (three) times daily.           . CHOLESTYRAMINE 4 G PO PACK   Oral   Take 1  packet by mouth 2 (two) times daily between meals.   60 each   2     Take Questran 2 hours before or after taking other ...   . DIAZEPAM 10 MG PO TABS   Oral   Take 10 mg by mouth every 6 (six) hours as needed.         Marland Kitchen DICYCLOMINE HCL 10 MG PO CAPS   Oral   Take 10 mg by mouth every other day.          Marland Kitchen DIPHENHYDRAMINE HCL 25 MG PO CAPS   Oral   Take 25 mg by mouth every 4 (four) hours as needed. For itching/allergy         . HYDROCODONE-ACETAMINOPHEN 5-325 MG PO TABS   Oral   Take 1 tablet by mouth every 4 (four) hours as needed.         Marland Kitchen PANTOPRAZOLE SODIUM 40 MG PO TBEC   Oral   Take 40 mg by mouth daily.          . SEROQUEL 50 MG PO TABS   Oral   Take 50 mg by mouth at bedtime.          . SERTRALINE HCL 50 MG PO TABS   Oral   Take 50 mg by mouth daily.          Marland Kitchen TEMAZEPAM 30 MG PO CAPS   Oral   Take 30 mg by mouth at bedtime.            BP 141/83  Pulse 101  Temp 98.4 F (36.9 C) (Oral)  Resp 18  SpO2 96%  Physical Exam  Nursing note and vitals reviewed. Constitutional: He is oriented to person, place, and time. He appears well-developed and well-nourished.  Non-toxic appearance.  HENT:  Head: Normocephalic.  Right Ear: Tympanic membrane and external ear normal.  Left Ear: Tympanic membrane and external ear normal.       No rash or lesions in the mouth. The airway is patent. The speech is clear and understandable. There is no swelling under the tongue.  Eyes: EOM and lids are normal. Pupils are equal, round, and reactive to light.  Neck: Normal range of motion. Neck supple. Carotid bruit is not present.  Cardiovascular: Normal rate, regular rhythm, normal heart sounds, intact distal pulses and normal pulses.   Pulmonary/Chest: Breath sounds normal. No respiratory distress.  Abdominal: Soft. Bowel sounds are normal. There is no tenderness. There is no guarding.  Musculoskeletal: Normal range of motion.       Surgical staples are in  place at the lower back.  Lymphadenopathy:  Head (right side): No submandibular adenopathy present.       Head (left side): No submandibular adenopathy present.    He has no cervical adenopathy.  Neurological: He is alert and oriented to person, place, and time. He has normal strength. No cranial nerve deficit or sensory deficit.  Skin: Skin is warm and dry.       There hives present on the chest abdomen back upper extremities and lower extremities.  Psychiatric: He has a normal mood and affect. His speech is normal.    ED Course  Procedures (including critical care time)  Labs Reviewed - No data to display No results found. Pulse ox 96% on room air. Within normal limits by my interpretation.  No diagnosis found.    MDM  I have reviewed nursing notes, vital signs, and all appropriate lab and imaging results for this patient.  After additional questioning and review of the past medical records during the patient's hospitalization and after his release, it was noted that the patient has an allergy to oxycodone however he has mentioned that he could take oxycodone with Benadryl. He was treated at home prior to the surgery with hydrocodone at 5 mg. While in the hospital his dose was increased to 10 mg. And his dose at home was increased to 10 mg.  The patient was treated with IV Pepcid, and oral Vistaril with significant improvement in the hives. The patient had serial examinations and there was no problem with swelling of the tongue or the back of the throat. At the time of discharge the patient states that the itching was beginning to return and he was given an additional tablet of the Vistaril.   The patient has been advised to use only 5 mg of the hydrocodone and to take that with Benadryl. The patient is given a prescription for vistaril to use for itching if needed. The patient is also advised that after his surgery has healed and it is stable for him to travel for him to be seen  by one of the allergist for additional evaluation and for management of his hives.      Kathie Dike, [place] 09/23/12 1758

## 2012-09-23 NOTE — ED Notes (Signed)
Pt had back surgery last Tuesday. Came home Sunday. Wife noticed bumps on buttock Saturday. "full blown" rash x 2 days ago. Pt has welts generalized all over body. Most is to sides, some in circular form. C/o itching. No new meds in hospital or since home that pt states has not taken in past. Has been doing home remedies and benadryl with no relief. Denies trouble swallowing or tongue swelling. nad at this time

## 2012-09-23 NOTE — ED Notes (Signed)
Per pharmacy, out of vistaril IV. PA aware. PO available

## 2012-09-26 ENCOUNTER — Encounter (HOSPITAL_COMMUNITY): Payer: Self-pay | Admitting: Emergency Medicine

## 2012-09-26 ENCOUNTER — Observation Stay (HOSPITAL_COMMUNITY)
Admission: EM | Admit: 2012-09-26 | Discharge: 2012-09-28 | Disposition: A | Discharge status: Home / Self Care | Payer: Medicare Other | Attending: Pulmonary Disease | Admitting: Pulmonary Disease

## 2012-09-26 DIAGNOSIS — L519 Erythema multiforme, unspecified: Secondary | ICD-10-CM

## 2012-09-26 DIAGNOSIS — G4700 Insomnia, unspecified: Secondary | ICD-10-CM | POA: Insufficient documentation

## 2012-09-26 DIAGNOSIS — X58XXXA Exposure to other specified factors, initial encounter: Secondary | ICD-10-CM | POA: Insufficient documentation

## 2012-09-26 DIAGNOSIS — R21 Rash and other nonspecific skin eruption: Principal | ICD-10-CM | POA: Insufficient documentation

## 2012-09-26 DIAGNOSIS — F411 Generalized anxiety disorder: Secondary | ICD-10-CM | POA: Insufficient documentation

## 2012-09-26 DIAGNOSIS — F3289 Other specified depressive episodes: Secondary | ICD-10-CM | POA: Insufficient documentation

## 2012-09-26 DIAGNOSIS — T782XXA Anaphylactic shock, unspecified, initial encounter: Secondary | ICD-10-CM

## 2012-09-26 DIAGNOSIS — F329 Major depressive disorder, single episode, unspecified: Secondary | ICD-10-CM | POA: Insufficient documentation

## 2012-09-26 DIAGNOSIS — T7840XA Allergy, unspecified, initial encounter: Secondary | ICD-10-CM | POA: Insufficient documentation

## 2012-09-26 LAB — CBC WITH DIFFERENTIAL/PLATELET
Basophils Relative: 0 % (ref 0–1)
Eosinophils Relative: 1 % (ref 0–5)
HCT: 37.5 % — ABNORMAL LOW (ref 39.0–52.0)
Hemoglobin: 12.9 g/dL — ABNORMAL LOW (ref 13.0–17.0)
Lymphs Abs: 2.7 10*3/uL (ref 0.7–4.0)
MCH: 28.1 pg (ref 26.0–34.0)
MCV: 81.7 fL (ref 78.0–100.0)
Monocytes Absolute: 0.3 10*3/uL (ref 0.1–1.0)
Neutro Abs: 7.5 10*3/uL (ref 1.7–7.7)
Platelets: 363 10*3/uL (ref 150–400)
RBC: 4.59 MIL/uL (ref 4.22–5.81)

## 2012-09-26 LAB — BASIC METABOLIC PANEL
BUN: 17 mg/dL (ref 6–23)
CO2: 25 mEq/L (ref 19–32)
Calcium: 9.6 mg/dL (ref 8.4–10.5)
Creatinine, Ser: 1.06 mg/dL (ref 0.50–1.35)
Glucose, Bld: 143 mg/dL — ABNORMAL HIGH (ref 70–99)

## 2012-09-26 MED ORDER — FAMOTIDINE IN NACL 20-0.9 MG/50ML-% IV SOLN
20.0000 mg | Freq: Once | INTRAVENOUS | Status: AC
  Administered 2012-09-26: 20 mg via INTRAVENOUS
  Filled 2012-09-26: qty 50

## 2012-09-26 MED ORDER — QUETIAPINE FUMARATE 25 MG PO TABS
50.0000 mg | ORAL_TABLET | Freq: Every day | ORAL | Status: DC
  Administered 2012-09-26 – 2012-09-27 (×2): 50 mg via ORAL
  Filled 2012-09-26 (×2): qty 2

## 2012-09-26 MED ORDER — EPINEPHRINE 0.3 MG/0.3ML IJ DEVI
INTRAMUSCULAR | Status: AC
  Administered 2012-09-26: 0.3 mg
  Filled 2012-09-26: qty 0.3

## 2012-09-26 MED ORDER — METHYLPREDNISOLONE SODIUM SUCC 125 MG IJ SOLR
125.0000 mg | Freq: Four times a day (QID) | INTRAMUSCULAR | Status: DC
  Administered 2012-09-26: 125 mg via INTRAVENOUS
  Filled 2012-09-26: qty 2

## 2012-09-26 MED ORDER — SERTRALINE HCL 50 MG PO TABS
50.0000 mg | ORAL_TABLET | Freq: Every day | ORAL | Status: DC
Start: 2012-09-26 — End: 2012-09-28
  Administered 2012-09-26 – 2012-09-28 (×3): 50 mg via ORAL
  Filled 2012-09-26 (×3): qty 1

## 2012-09-26 MED ORDER — TEMAZEPAM 15 MG PO CAPS
30.0000 mg | ORAL_CAPSULE | Freq: Every day | ORAL | Status: DC
  Administered 2012-09-26 – 2012-09-27 (×2): 30 mg via ORAL
  Filled 2012-09-26 (×2): qty 2

## 2012-09-26 MED ORDER — ACETAMINOPHEN 500 MG PO TABS
500.0000 mg | ORAL_TABLET | Freq: Four times a day (QID) | ORAL | Status: DC | PRN
  Administered 2012-09-26: 500 mg via ORAL
  Filled 2012-09-26: qty 1

## 2012-09-26 MED ORDER — ALBUTEROL SULFATE (5 MG/ML) 0.5% IN NEBU: 2.5000 mg | INHALATION_SOLUTION | RESPIRATORY_TRACT | Status: AC | PRN

## 2012-09-26 MED ORDER — DIPHENHYDRAMINE HCL 50 MG/ML IJ SOLN
25.0000 mg | Freq: Once | INTRAMUSCULAR | Status: AC
  Administered 2012-09-26: 25 mg via INTRAVENOUS
  Filled 2012-09-26: qty 1

## 2012-09-26 MED ORDER — EPINEPHRINE 0.3 MG/0.3ML IJ DEVI: 0.3000 mg | Freq: Once | INTRAMUSCULAR | Status: AC

## 2012-09-26 MED ORDER — DIPHENHYDRAMINE HCL 50 MG/ML IJ SOLN
25.0000 mg | Freq: Four times a day (QID) | INTRAMUSCULAR | Status: DC
  Administered 2012-09-26 – 2012-09-27 (×5): 25 mg via INTRAVENOUS
  Filled 2012-09-26 (×5): qty 1

## 2012-09-26 MED ORDER — DIPHENHYDRAMINE HCL 25 MG PO CAPS: 50.0000 mg | ORAL_CAPSULE | Freq: Four times a day (QID) | ORAL | Status: DC | PRN

## 2012-09-26 MED ORDER — CYCLOBENZAPRINE HCL 10 MG PO TABS: 10.0000 mg | ORAL_TABLET | Freq: Three times a day (TID) | ORAL | Status: DC | PRN

## 2012-09-26 MED ORDER — METHYLPREDNISOLONE SODIUM SUCC 125 MG IJ SOLR
125.0000 mg | Freq: Once | INTRAMUSCULAR | Status: AC
  Administered 2012-09-26: 125 mg via INTRAVENOUS
  Filled 2012-09-26: qty 2

## 2012-09-26 MED ORDER — ENOXAPARIN SODIUM 30 MG/0.3ML ~~LOC~~ SOLN
40.0000 mg | SUBCUTANEOUS | Status: DC
  Administered 2012-09-26 – 2012-09-27 (×2): 40 mg via SUBCUTANEOUS
  Filled 2012-09-26: qty 0.6
  Filled 2012-09-26: qty 0.3

## 2012-09-26 MED ORDER — PANTOPRAZOLE SODIUM 40 MG PO TBEC
40.0000 mg | DELAYED_RELEASE_TABLET | Freq: Every day | ORAL | Status: DC
  Administered 2012-09-26 – 2012-09-28 (×3): 40 mg via ORAL
  Filled 2012-09-26 (×3): qty 1

## 2012-09-26 MED ORDER — SODIUM CHLORIDE 0.9 % IV BOLUS (SEPSIS)
500.0000 mL | Freq: Once | INTRAVENOUS | Status: AC
  Administered 2012-09-26: 500 mL via INTRAVENOUS

## 2012-09-26 NOTE — ED Notes (Signed)
Patient provided water per request

## 2012-09-26 NOTE — ED Notes (Signed)
Dr Megan Mans downgraded patient bed to Telemetry. ICU notified.

## 2012-09-26 NOTE — ED Notes (Signed)
Report given to [facility], RN ICU. Ready to receive patient.

## 2012-09-26 NOTE — ED Notes (Signed)
Report given to [place], RN, unit 300. Ready to receive patient.

## 2012-09-26 NOTE — ED Provider Notes (Signed)
Medical screening examination/treatment/procedure(s) were performed by non-physician practitioner and as supervising physician I was immediately available for consultation/collaboration.   Mitsuye Schrodt L Claris Guymon, MD 09/26/12 1515 

## 2012-09-26 NOTE — ED Notes (Signed)
Attempted to call report to ICU. No answer

## 2012-09-26 NOTE — ED Notes (Signed)
Patient reports allergic reaction that started Tuesday. Reports was released from hospital on the 19th of this month. Treated for allergic recation on Thursday as well. Complaining of urticaria, shortness of breath, and swelling to face and throat.

## 2012-09-26 NOTE — ED Provider Notes (Signed)
History     CSN: [id number]  Arrival date & time 09/26/12  0602   First MD Initiated Contact with Patient 09/26/12 [phone]      Chief Complaint  Patient presents with  . Allergic Reaction    (Consider location/radiation/quality/duration/timing/severity/associated sxs/prior treatment) HPIHarold T Rozo is a 57 y.o. male with a past pertinent medical history of a surgery on January 14 approximately 10 days ago, says he's had a rash with welts and hives since that time. He's been to the emergency department on 1/23 for these welts because they're not getting better with Benadryl-he was prescribed Vistaril at the time but was not given any steroids because he has an allergic reaction to prednisone which consists of swelling. He's not had any changes in his medication, he has been taking Benadryl and Vistaril without relief. Tonight at approximately 3 AM, he started having difficulty breathing, abdominal pain nausea and some mild shortness of breath. He said also the swelling in his face worsened as well. Patient's had some constant diarrhea since his surgery but that has not changed.  Patient still has welts all over his body as well as itching. Symptoms are severe, unremitting, he's noticed no lesions in his mouth, he has no complaints of changes in his vision or eye pain, no pain on defecation, no penile pain.  Past Medical History  Diagnosis Date  . Hyperlipidemia   . Anxiety   . Depression   . GERD (gastroesophageal reflux disease)   . Sleep apnea     uses CIPAP machine at night  . Heart murmur     Years ago- not now  . Headache     after surgery  . Complication of anesthesia     pt had a hard time being able to move after spinal anesthesia , 3-4 hours  . PONV (postoperative nausea and vomiting)   . Arthritis     Past Surgical History  Procedure Date  . Knee arthroscopy     left knee  . Knee arthroscopy     right knee   . Spinal fusion     x 2  . Neck fusion   . Back  surgery   . Colonoscopy 06/27/2011    Procedure: COLONOSCOPY;  Surgeon: Rogene Houston, MD;  [facility]: [facility];  Service: Endoscopy;  Laterality: N/A;  9:00 / Pt to be here at 9am for 10:45 procedure, benign polyps removed  . Appendectomy   . Hernia repair     umbilical hernia  . Cardiac catheterization   . Chondroplasty 08/15/2011    Procedure: CHONDROPLASTY;  Surgeon: [name], MD;  Location: [facility];  Service: Orthopedics;  Laterality: Left;  . Lumbar laminectomy/decompression microdiscectomy 09/14/2012    Procedure: LUMBAR LAMINECTOMY/DECOMPRESSION MICRODISCECTOMY 1 LEVEL;  Surgeon: Floyce Stakes, MD;  Location: [facility];  Service: Neurosurgery;  Laterality: Right;  Right Lumbar three-four Diskectomy    Family History  Problem Relation Age of Onset  . Diabetes    . Lung disease    . Arthritis    . Anesthesia problems Neg Hx   . Hypotension Neg Hx   . Malignant hyperthermia Neg Hx   . Pseudochol deficiency Neg Hx     History  Substance Use Topics  . Smoking status: Current Every Day Smoker -- 1.0 packs/day for 40 years    Types: Cigarettes  . Smokeless tobacco: Not on file  . Alcohol Use: No      Review of Systems  Allergies  Celebrex; Codeine; Cortisone; Doxycycline; Oxycodone-acetaminophen; Prednisone; and Relafen  Home Medications   Current Outpatient Rx  Name  Route  Sig  Dispense  Refill  . CYCLOBENZAPRINE HCL 10 MG PO TABS   Oral   Take 10 mg by mouth 3 (three) times daily as needed.         Marland Kitchen DIAZEPAM 10 MG PO TABS   Oral   Take 10 mg by mouth every 6 (six) hours as needed. Anxiety         . DIPHENHYDRAMINE HCL 25 MG PO CAPS   Oral   Take 25 mg by mouth every 4 (four) hours as needed. For itching/allergy         . HYDROCODONE-ACETAMINOPHEN 5-325 MG PO TABS   Oral   Take 1 tablet by mouth every 4 (four) hours as needed. Pain         . HYDROXYZINE HCL 25 MG PO TABS   Oral   Take 1 tablet (25 mg total) by mouth every 6  (six) hours.   15 tablet   0   . PANTOPRAZOLE SODIUM 40 MG PO TBEC   Oral   Take 40 mg by mouth daily.          . SEROQUEL 50 MG PO TABS   Oral   Take 50 mg by mouth at bedtime.          . SERTRALINE HCL 50 MG PO TABS   Oral   Take 50 mg by mouth daily.          Marland Kitchen TEMAZEPAM 30 MG PO CAPS   Oral   Take 30 mg by mouth at bedtime.            BP 145/96  Pulse 98  Temp 98.4 F (36.9 C) (Oral)  Resp 22  Ht 5\' 6"  (1.676 m)  Wt 210 lb (95.255 kg)  BMI 33.89 kg/m2  SpO2 94%  Physical Exam  Nursing notes reviewed.  Electronic medical record reviewed. VITAL SIGNS:   Filed Vitals:   09/26/12 0617  BP: 145/96  Pulse: 98  Temp: 98.4 F (36.9 C)  TempSrc: Oral  Resp: 22  Height: 5\' 6"  (1.676 m)  Weight: 210 lb (95.255 kg)  SpO2: 94%   CONSTITUTIONAL: Awake, oriented, appears non-toxic HENT: Atraumatic, normocephalic, oral mucosa pink and moist, airway patent - pharynx is without erythema or exudate. No lesions in the oropharynx, Nares patent without drainage. External ears normal. EYES: Conjunctiva clear, EOMI, PERRLA NECK: Trachea midline, non-tender, supple CARDIOVASCULAR: Normal heart rate, Normal rhythm, No murmurs, rubs, gallops PULMONARY/CHEST: Clear to auscultation, no rhonchi, wheezes, or rales. Symmetrical breath sounds. Non-tender. ABDOMINAL: Non-distended, soft, non-tender - no rebound or guarding.  BS normal. NEUROLOGIC: Non-focal, moving all four extremities, no gross sensory or motor deficits. EXTREMITIES: No clubbing, cyanosis, or edema SKIN: Warm, Dry. Patient has what appears to be 2 separate rashes. Patient does have urticaria at various points over his body's including his hips abdomen back and neck. Patient also has some welts on his face at this time. Patient also has erythema multiforme all over his chest, he has no involvement of mucous membranes, no aphthous ulcers. ED Course  CRITICAL CARE Performed by: Rhunette Croft Authorized by:  Rhunette Croft Total critical care time: 30 minutes Critical care time was exclusive of separately billable procedures and treating other patients. Critical care was necessary to treat or prevent imminent or life-threatening deterioration of the following conditions: respiratory failure (Anaphylactic reaction). Critical care was  time spent personally by me on the following activities: discussions with consultants, evaluation of patient's response to treatment, obtaining history from patient or surrogate, ordering and review of laboratory studies, pulse oximetry, review of old charts, re-evaluation of patient's condition, ordering and review of radiographic studies, ordering and performing treatments and interventions, examination of patient, discussions with primary provider and development of treatment plan with patient or surrogate.   (including critical care time)  Labs Reviewed  CBC WITH DIFFERENTIAL - Abnormal; Notable for the following:    WBC 10.6 (*)     Hemoglobin 12.9 (*)     HCT 37.5 (*)     All other components within normal limits  BASIC METABOLIC PANEL - Abnormal; Notable for the following:    Sodium 128 (*)     Chloride 91 (*)     Glucose, Bld 143 (*)     GFR calc non Af Amer 78 (*)     All other components within normal limits   No results found.   1. Anaphylactic reaction   2. Rash   3. Erythema multiforme       MDM  AMINE ADELSON is a 57 y.o. male presents with worsening allergic reaction now encompassing airway and GI symptoms-for the patient's anaphylactic reaction we'll give him or Benadryl, and given him 0.3 mg of IM epinephrine.  I also had a discussion with the patient that since we are are treating him for an anaphylactic reaction we should try some Solu-Medrol as the patient actually really needs steroids at this time-I told him that he is likely not allergic to prednisone itself but rather one of the agents in the prednisone pills. Patient is in  agreement with this medical plan.  Patient is feeling better. He is mildly hyponatremic, we'll start him on some normal saline. Patient has scheduled Benadryl and will be admitted to the hospital. Discussed with Dr. Megan Mans for admission and holding orders have been placed.  Platelet count is normal, I do not think he's got a heparin-induced thrombocytopenia. Not exactly sure where his erythema multiforme is coming from.  She is stable and admitted         Jones Skene, MD 09/26/12 [phone]

## 2012-09-26 NOTE — ED Notes (Signed)
Attempted to call report to Misty Stanley, Charity fundraiser. She is on phone currently. Will call back.

## 2012-09-27 LAB — CBC WITH DIFFERENTIAL/PLATELET
Eosinophils Relative: 1 % (ref 0–5)
HCT: 34.9 % — ABNORMAL LOW (ref 39.0–52.0)
Hemoglobin: 12.1 g/dL — ABNORMAL LOW (ref 13.0–17.0)
Lymphocytes Relative: 21 % (ref 12–46)
Lymphs Abs: 2.9 10*3/uL (ref 0.7–4.0)
MCH: 28.8 pg (ref 26.0–34.0)
MCV: 83.1 fL (ref 78.0–100.0)
Monocytes Relative: 4 % (ref 3–12)
Platelets: 378 10*3/uL (ref 150–400)
RBC: 4.2 MIL/uL — ABNORMAL LOW (ref 4.22–5.81)
WBC: 14.2 10*3/uL — ABNORMAL HIGH (ref 4.0–10.5)

## 2012-09-27 MED ORDER — PROMETHAZINE HCL 25 MG/ML IJ SOLN
12.5000 mg | Freq: Four times a day (QID) | INTRAMUSCULAR | Status: DC | PRN
  Administered 2012-09-27: 12.5 mg via INTRAVENOUS
  Filled 2012-09-27: qty 1

## 2012-09-27 MED ORDER — DIPHENHYDRAMINE HCL 50 MG/ML IJ SOLN
50.0000 mg | Freq: Four times a day (QID) | INTRAMUSCULAR | Status: DC
  Administered 2012-09-27 – 2012-09-28 (×3): 50 mg via INTRAVENOUS
  Filled 2012-09-27 (×3): qty 1

## 2012-09-27 MED ORDER — PHENOL 1.4 % MT LIQD: 1.0000 | OROMUCOSAL | Status: DC | PRN

## 2012-09-27 MED ORDER — FAMOTIDINE 20 MG PO TABS
40.0000 mg | ORAL_TABLET | Freq: Two times a day (BID) | ORAL | Status: DC
  Administered 2012-09-27 – 2012-09-28 (×3): 40 mg via ORAL
  Filled 2012-09-27 (×3): qty 2

## 2012-09-27 NOTE — H&P (Signed)
NAMEDUSTAN, HYAMS              ACCOUNT NO.:  [id number]  MEDICAL RECORD NO.:  [id number]  LOCATION:  B169                          FACILITY:  [facility]  PHYSICIAN:  Anastaisa Wooding G. Everette Rank, MD   DATE OF BIRTH:  [birth year]  DATE OF ADMISSION:  09/26/2012 DATE OF DISCHARGE:  LH                             HISTORY & PHYSICAL   HISTORY OF PRESENT ILLNESS:  This 57 year old male presented to the emergency room with a chief complaint of being extensive rash over most of his body and difficulty with his breathing.  This patient had back surgery on January 14 by Dr. Joya Salm in [place].  This was apparently a disk surgery.  He subsequently developed urticarial rash and welts over most of his body.  He was seen in the emergency department on September 23, 2012, was given Benadryl but no steroids since he stated had been allergic to this.  He returned with continued problems of generalized itching and welts over most of his body and swelling of his face.  He was seen and evaluated by ED physician, was given Benadryl and epinephrine 0.3 mg.  His breathing improved.  It was felt that he had anaphylactic type reaction and urticarial rash.  Possible erythema multiforme.  He was subsequently admitted to the hospital for further treatment of these problems.  He was stable and feeling better at the time of his admission.  SOCIAL HISTORY:  Patient smokes cigarettes daily and does not use alcohol.  FAMILY HISTORY:  Positive for diabetes, lung disease, arthritis, hypotension, malignant hypothermia, cholesterol abnormality.  PAST MEDICAL HISTORY:  Patient has had treatment for gastroesophageal reflux disease, depression, anxiety, hyperlipidemia, has heart murmur, has had complications secondary to anesthesia in the past.  SURGICAL HISTORY:  Arthroscopy of left knee and right knee, spinal fusion x2, neck fusion, colonoscopy, appendectomy, hernia repair, cardiac catheterization, chondroplasty, lumbar  laminectomy with decompression microdiscectomy recently by Dr. Joya Salm in [place].  REVIEW OF SYSTEMS:  HEENT:  Negative.  CARDIOPULMONARY:  No cough, hemoptysis, or dyspnea.  GI:  No bowel irregularity or bleeding.  GU: No dysuria or hematuria.  ALLERGIES:  CELEBREX, CODEINE, CORTISONE, DOXYCYCLINE, OXYCODONE, ACETAMINOPHEN, PREDNISONE, AND RELAFEN.  MEDICATION LIST: 1. Cyclobenzaprine 10 mg t.i.d. 2. Diazepam 10 mg every 6 hours as needed for anxiety. 3. Benadryl 25 mg every 4 hours as needed for itching. 4. Hydrocodone/acetaminophen 5/325 every 4 hours p.r.n. for pain. 5. Hydroxyzine hydrochloride 25 mg every 6 hours. 6. Seroquel 50 mg at bedtime. 7. Sertraline 10 mg daily. 8. Temazepam 30 mg at bedtime. 9. Pantoprazole 40 mg daily.  PHYSICAL EXAMINATION:  GENERAL:  The patient is fairly comfortable, alert at the time of this examination. VITAL SIGNS:  Blood pressure 125/70, respirations 20, pulse 74, temp is 97.9. HEENT:  Eyes, PERRLA.  TMs negative.  Oropharynx benign. NECK:  Supple.  No JVD or thyroid abnormalities. HEART:  Regular rhythm.  No murmurs. LUNGS:  Clear to P and A. ABDOMEN:  No palpable organs or masses.  No organomegaly. EXTREMITIES:  Free of edema.  SKIN:  Patient does have healing incision over the lower back and midline.  He has urticarial eruptions over most of his body, had a  different rash on the anterior portion of his chest which is erythematous and pruritic.  ASSESSMENT: 1. The patient was admitted with what was felt to be urticarial rash,     possible anaphylactic reaction, and erythema multiforme.  He did     have recent surgery for disc in lower back by Dr. Joya Salm in     [place].  PLAN:  To admit to observation.  Continue Benadryl.  Continue to monitor for respiratory distress.  We will give additional epinephrine as needed.  We are holding steroids at the moment because of his allergies and resistance from the family members in regard  to this medication.     Oreta Soloway G. Everette Rank, MD     AGM/MEDQ  D:  09/26/2012  T:  09/27/2012  Job:  [id number]

## 2012-09-27 NOTE — Progress Notes (Signed)
He was admitted with a rash. This is thought to be related to medication. It is not totally clear if he has had a new medication or not. He had surgery on his back and was sent home on hydrocodone which he had taken before but he was on a higher dose. He has also been on Valium which may be new. His rash got much worse and he was having significant swelling and came to the emergency room. This is his second trip which helped. to the emergency room and on the first trip he was given Benadryl  Exam shows he is awake and alert. He does have a diffuse rash across his legs trunk and arms. His face is somewhat swollen. His temperature is 97.6, pulse 69 respirations 19 blood pressure 108/59 O2 sat 97%. His chest is clear  He has a rash which may be related to his medications. Is not totally clear.  I will see if I can get dermatology consult and will add Pepcid he says he's allergic to prednisone so we may not be able to give him steroids

## 2012-09-27 NOTE — Progress Notes (Signed)
UR Chart Review Completed  

## 2012-09-27 NOTE — Care Management Note (Signed)
    Page 1 of 1   09/28/2012     9:09:25 AM   CARE MANAGEMENT NOTE 09/28/2012  Patient:  Christopher [name], Christopher [name]   Account Number:  [id number]  Date Initiated:  09/27/2012  Documentation initiated by:  Sharrie Rothman  Subjective/Objective Assessment:   Pt admitted from home with allergic reaction to ? medications. Pt lives with his wife and will return home at discharge. Pt has a cane, walker, BSC, and shower chair from previous surgery.  Pt is fairly indpendent with ADL's.     Action/Plan:   No CM or HH needs noted.   Anticipated DC Date:  09/28/2012   Anticipated DC Plan:  HOME/SELF CARE      DC Planning Services  CM consult      Choice offered to / List presented to:             Status of service:  Completed, signed off Medicare Important Message given?   (If response is "NO", the following Medicare IM given date fields will be blank) Date Medicare IM given:   Date Additional Medicare IM given:    Discharge Disposition:  HOME/SELF CARE  Per UR Regulation:    If discussed at Long Length of Stay Meetings, dates discussed:    Comments:  09/28/12 0910 Arlyss Queen, RN BSN CM Pt discharged home today. No CM or HH needs noted.  09/27/12 1355 Arlyss Queen, RN BSN CM

## 2012-09-28 MED ORDER — DIPHENHYDRAMINE HCL 25 MG PO CAPS: 50.0000 mg | ORAL_CAPSULE | ORAL | Status: DC | PRN

## 2012-09-28 NOTE — Plan of Care (Signed)
Problem: Discharge Progression Outcomes Goal: Discharge plan in place and appropriate Outcome: Completed/Met Date Met:  09/28/12 D/c home with wife Goal: Complications resolved/controlled Outcome: Progressing Follow up appt with md

## 2012-09-28 NOTE — Progress Notes (Signed)
He says he feels much better and wants to go home. He has less itching. Dr. Nita Sells saw him yesterday and feels that this is a drug reaction it should be treated with antihistamines. I will plan to discharge today

## 2012-09-29 NOTE — Discharge Summary (Signed)
Physician Discharge Summary  Patient ID: Christopher [name] MRN: [id number] DOB/AGE: 1956-05-06 57 y.o. Primary Care Physician:Vernor Monnig L, MD Admit date: 09/26/2012 Discharge date: 09/29/2012    Discharge Diagnoses:  Rash related to allergic reaction Active Problems:  * No active hospital problems. *  status post lumbar laminectomy Anxiety Depression Insomnia    Medication List     As of 09/29/2012  2:03 PM    TAKE these medications         cyclobenzaprine 10 MG tablet   Commonly known as: FLEXERIL   Take 10 mg by mouth 3 (three) times daily as needed.      diazepam 10 MG tablet   Commonly known as: VALIUM   Take 10 mg by mouth every 6 (six) hours as needed. Anxiety      diphenhydrAMINE 25 mg capsule   Commonly known as: BENADRYL   Take 2 capsules (50 mg total) by mouth every 4 (four) hours as needed. For itching/allergy      HYDROcodone-acetaminophen 5-325 MG per tablet   Commonly known as: NORCO/VICODIN   Take 1 tablet by mouth every 4 (four) hours as needed. Pain      hydrOXYzine 25 MG tablet   Commonly known as: ATARAX/VISTARIL   Take 1 tablet (25 mg total) by mouth every 6 (six) hours.      pantoprazole 40 MG tablet   Commonly known as: PROTONIX   Take 40 mg by mouth daily.      SEROQUEL 50 MG tablet   Generic drug: QUEtiapine   Take 50 mg by mouth at bedtime.      sertraline 50 MG tablet   Commonly known as: ZOLOFT   Take 50 mg by mouth daily.      temazepam 30 MG capsule   Commonly known as: RESTORIL   Take 30 mg by mouth at bedtime.        Discharged Condition:improved    Consults:dermatology, Dr. Margo Aye  Significant Diagnostic Studies: Dg Lumbar Spine 2-3 Views  09/14/2012  *RADIOLOGY REPORT*  Clinical Data: Back pain  LUMBAR SPINE - 2-3 VIEW  Comparison: Multiple priors.  Findings: Portable cross-table film #1 demonstrates a blunt instrument from a posterior approach directed most closely toward the pedicle of L3.  Film #2 demonstrates a  slightly angled probe within the L3-4 disc space.  IMPRESSION: As above.   Original Report Authenticated By: Davonna Belling, M.D.    Ct Lumbar Spine W Contrast  09/02/2012  *RADIOLOGY REPORT*  Clinical Data:  Low back pain.  Right leg pain.   MYELOGRAM INJECTION  Technique:  Informed consent was obtained from the patient prior to the procedure, including potential complications of headache, allergy, infection and pain.  A timeout procedure was performed. With the patient prone, the lower back was prepped with Betadine. 1% Lidocaine was used for local anesthesia.  Lumbar puncture was performed at the right L2-3 level using a 22 gauge needle with return of clear CSF.  18 ml of Omnipaque 180was injected into the subarachnoid space .  IMPRESSION: Successful injection of  intrathecal contrast for myelography.  MYELOGRAM LUMBAR  Technique:  Following injection of intrathecal Omnipaque contrast, spine imaging in multiple projections was performed using fluoroscopy.  Fluoroscopy Time: 57 seconds .  Comparison:  Radiography 08/23/2012.  MRI 02/02/2010.  Findings: L1-2:  Small anterior extradural defect.  No gross neural compression.  L2-3:  Normal interspace.  L3-4:  Moderate anterior extradural defect to.  Narrowing of the lateral recesses bilaterally.  L4 -  sacrum:  Previous discectomy and fusion.  Pedicle screws and posterior rods at L4-5.  Fusion is probably solid.  No motion occurs with flexion and extension.  No stenosis in the region.  Standing lateral flexion extension films show some rocking motion at L3-4 but no subluxation.  IMPRESSION: Small anterior extradural defect at L1-2.  No apparent stenosis.  Moderate anterior extradural defect at L3-4.  Some narrowing of the lateral recesses.  Rocking motion with flexion and extension.  No evidence of motion in the fusion segment from L4 to the sacrum. No apparent stenosis.  CT MYELOGRAPHY LUMBAR SPINE  Technique:  CT imaging of the lumbar spine was performed after  intrathecal contrast administration.  Multiplanar CT image reconstructions were also generated.  Findings:  T12-L1:  Normal interspace.  L1-2:  Shallow broad-based disc herniation indents the thecal sac but does not cause gross neural compression.  L2-3:  Normal interspace.  L3-4:  Broad-based disc herniation, slightly right paracentral prominent.  Mild facet and ligamentous hypertrophy.  Stenosis at this level could cause neural compression, particularly on the right.  There is some caudal migration of disc material.  L4-5:  Previous discectomy and fusion.  Pedicle screws and posterior rods.  Fusion is probably solid.  No evidence of motion. No stenosis.  L5-S1:  Distant fusion which is solid.  Wide patency of the canal and foramina.  Screw shadows within the sacrum.  Sacroiliac joints show mild osteoarthritis.  IMPRESSION: L1-2:  Shallow disc protrusion without gross neural compression.  L3-4:  Broad-based disc herniation, prominent in the right paracentral region.  Stenosis of the lateral recesses right worse than left.  Some caudal migration of disc material behind L4.  Satisfactory appearance of the fusion segment from L4 to the sacrum.   Original Report Authenticated By: Paulina Fusi, M.D.    Dg Myelogram Lumbar  09/02/2012  *RADIOLOGY REPORT*  Clinical Data:  Low back pain.  Right leg pain.   MYELOGRAM INJECTION  Technique:  Informed consent was obtained from the patient prior to the procedure, including potential complications of headache, allergy, infection and pain.  A timeout procedure was performed. With the patient prone, the lower back was prepped with Betadine. 1% Lidocaine was used for local anesthesia.  Lumbar puncture was performed at the right L2-3 level using a 22 gauge needle with return of clear CSF.  18 ml of Omnipaque 180was injected into the subarachnoid space .  IMPRESSION: Successful injection of  intrathecal contrast for myelography.  MYELOGRAM LUMBAR  Technique:  Following injection of  intrathecal Omnipaque contrast, spine imaging in multiple projections was performed using fluoroscopy.  Fluoroscopy Time: 57 seconds .  Comparison:  Radiography 08/23/2012.  MRI 02/02/2010.  Findings: L1-2:  Small anterior extradural defect.  No gross neural compression.  L2-3:  Normal interspace.  L3-4:  Moderate anterior extradural defect to.  Narrowing of the lateral recesses bilaterally.  L4 -sacrum:  Previous discectomy and fusion.  Pedicle screws and posterior rods at L4-5.  Fusion is probably solid.  No motion occurs with flexion and extension.  No stenosis in the region.  Standing lateral flexion extension films show some rocking motion at L3-4 but no subluxation.  IMPRESSION: Small anterior extradural defect at L1-2.  No apparent stenosis.  Moderate anterior extradural defect at L3-4.  Some narrowing of the lateral recesses.  Rocking motion with flexion and extension.  No evidence of motion in the fusion segment from L4 to the sacrum. No apparent stenosis.  CT MYELOGRAPHY LUMBAR SPINE  Technique:  CT imaging of the lumbar spine was performed after intrathecal contrast administration.  Multiplanar CT image reconstructions were also generated.  Findings:  T12-L1:  Normal interspace.  L1-2:  Shallow broad-based disc herniation indents the thecal sac but does not cause gross neural compression.  L2-3:  Normal interspace.  L3-4:  Broad-based disc herniation, slightly right paracentral prominent.  Mild facet and ligamentous hypertrophy.  Stenosis at this level could cause neural compression, particularly on the right.  There is some caudal migration of disc material.  L4-5:  Previous discectomy and fusion.  Pedicle screws and posterior rods.  Fusion is probably solid.  No evidence of motion. No stenosis.  L5-S1:  Distant fusion which is solid.  Wide patency of the canal and foramina.  Screw shadows within the sacrum.  Sacroiliac joints show mild osteoarthritis.  IMPRESSION: L1-2:  Shallow disc protrusion without  gross neural compression.  L3-4:  Broad-based disc herniation, prominent in the right paracentral region.  Stenosis of the lateral recesses right worse than left.  Some caudal migration of disc material behind L4.  Satisfactory appearance of the fusion segment from L4 to the sacrum.   Original Report Authenticated By: Paulina Fusi, M.D.     Lab Results: Basic Metabolic Panel: No results found for this basename: NA:2,K:2,CL:2,CO2:2,GLUCOSE:2,BUN:2,CREATININE:2,CALCIUM:2,MG:2,PHOS:2 in the last 72 hours Liver Function Tests: No results found for this basename: AST:2,ALT:2,ALKPHOS:2,BILITOT:2,PROT:2,ALBUMIN:2 in the last 72 hours   CBC:  Basename 09/27/12 1754  WBC 14.2*  NEUTROABS 10.6*  HGB 12.1*  HCT 34.9*  MCV 83.1  PLT 378    No results found for this or any previous visit (from the past 240 hour(s)).   Hospital Course: he had back surgery approximately 3 weeks ago. He developed a rash soon after that. He has been treated with anti-histamines but continued having trouble with rash and then developed swelling. He had 2 trips to the emergency department and was admitted on the second trip. He was started on large dose anti-histamines and Pepcid. He improved. He had dermatology consultation and this was felt to be a reaction to a medication but it is not clear what that medicine is. He was much improved by the time of discharge  Discharge Exam: Blood pressure 138/65, pulse 70, temperature 97.3 F (36.3 C), temperature source Oral, resp. rate 20, height 5\' 6"  (1.676 m), weight 98.9 kg (218 lb 0.6 oz), SpO2 96.00%. His chest is clear. His heart is regular. He still has minimal urticaria  Disposition: home      Discharge Orders    Future Orders Please Complete By Expires   Discharge patient         Follow-up Information    Follow up with Daniele Yankowski L, MD.   Contact information:   [address] [place] [address] [phone]           Signed: Fredirick Maudlin Pager [phone]  09/29/2012, 2:03 PM

## 2012-10-13 ENCOUNTER — Ambulatory Visit (HOSPITAL_COMMUNITY)
Admission: RE | Admit: 2012-10-13 | Discharge: 2012-10-13 | Disposition: A | Discharge status: Home / Self Care | Payer: Medicare Other | Source: Ambulatory Visit | Attending: Neurosurgery | Admitting: Neurosurgery

## 2012-10-13 ENCOUNTER — Ambulatory Visit (HOSPITAL_COMMUNITY): Payer: Medicare Other | Admitting: Physical Therapy

## 2012-10-13 DIAGNOSIS — IMO0001 Reserved for inherently not codable concepts without codable children: Secondary | ICD-10-CM | POA: Insufficient documentation

## 2012-10-13 DIAGNOSIS — M25469 Effusion, unspecified knee: Secondary | ICD-10-CM | POA: Insufficient documentation

## 2012-10-13 DIAGNOSIS — M545 Low back pain, unspecified: Secondary | ICD-10-CM | POA: Insufficient documentation

## 2012-10-13 DIAGNOSIS — M25569 Pain in unspecified knee: Secondary | ICD-10-CM | POA: Insufficient documentation

## 2012-10-13 DIAGNOSIS — M6281 Muscle weakness (generalized): Secondary | ICD-10-CM | POA: Insufficient documentation

## 2012-10-13 NOTE — Evaluation (Signed)
Physical Therapy Evaluation  Patient Details  Name: Christopher [name] MRN: [id number] Date of Birth: [birth year]  Today's Date: 10/13/2012 Time: 1028-1104 PT Time Calculation (min): 36 min  Visit#: 1 of 8  Re-eval: 11/12/12 Assessment Diagnosis: back surgery   Authorization: Susa Simmonds   Past Medical History:  Past Medical History  Diagnosis Date  . Hyperlipidemia   . Anxiety   . Depression   . GERD (gastroesophageal reflux disease)   . Sleep apnea     uses CIPAP machine at night  . Heart murmur     Years ago- not now  . Headache     after surgery  . Complication of anesthesia     pt had a hard time being able to move after spinal anesthesia , 3-4 hours  . PONV (postoperative nausea and vomiting)   . Arthritis    Past Surgical History:  Past Surgical History  Procedure Laterality Date  . Knee arthroscopy      left knee  . Knee arthroscopy      right knee   . Spinal fusion      x 2  . Neck fusion    . Back surgery    . Colonoscopy  06/27/2011    Procedure: COLONOSCOPY;  Surgeon: Malissa Hippo, MD;  Location: [facility];  Service: Endoscopy;  Laterality: N/A;  9:00 / Pt to be here at 9am for 10:45 procedure, benign polyps removed  . Appendectomy    . Hernia repair      umbilical hernia  . Cardiac catheterization    . Chondroplasty  08/15/2011    Procedure: CHONDROPLASTY;  Surgeon: Fuller Canada, MD;  Location: [facility];  Service: Orthopedics;  Laterality: Left;  . Lumbar laminectomy/decompression microdiscectomy  09/14/2012    Procedure: LUMBAR LAMINECTOMY/DECOMPRESSION MICRODISCECTOMY 1 LEVEL;  Surgeon: Karn Cassis, MD;  Location: [facility];  Service: Neurosurgery;  Laterality: Right;  Right Lumbar three-four Diskectomy    Subjective Symptoms/Limitations Symptoms: Mr. Cen states he had been having chronic back pain and opted to have surgery on 09/14/2012.  Operation reports that therapist viewed noted that  a laminectomy at L3-4 was performed as well as  lysis of adhesions.  He states that since the surgery his right leg has gave out on him 4 fimes.  He had HH therapy which helped but his leg will continue to give out if he does too much at home.  He is unable to go up steps straight he has to go up the stairs at his house sideways.  He states he can walk without his walker by himself for short distances but if he goes to far the leg will give way on him without any warning.  The patient states that when her was discharged from the hospital he got home and he broke out over his body and swelled up due to being allergic to the medication and ended up back in the hospital for a full week.  He is now being referred to therapy to improve his strength and functional mobility.  How long can you sit comfortably?: sitting is no problem How long can you stand comfortably?: he can stand for ten minutes without difficulty. How long can you walk comfortably?: With walker the most the patient is able to walk is for an hour at a time.  Without the walker he is only able to walk for about 50 ft.  At home he walks with a cane and the longest he has walked  is 50 ft at a time. Pain Assessment Pain Score:   5 (the patient has not taken any pain med yesterday.)    Sensation/Coordination/Flexibility/Functional Tests Functional Tests Functional Tests: Oswestry 21  Assessment RLE Strength Right Hip Flexion: 3-/5 Right Hip Extension: 3/5 Right Hip ABduction: 5/5 Right Hip ADduction: 3+/5 Right Knee Flexion: 5/5 Right Knee Extension: 2+/5 Right Ankle Dorsiflexion: 3/5 LLE Strength Left Hip Flexion: 5/5 Left Hip Extension: 4/5 Left Hip ABduction: 5/5 Left Hip ADduction: 4/5 Left Knee Flexion: 5/5 Left Knee Extension: 5/5 Left Ankle Dorsiflexion: 4/5  Exercise/Treatments Active Hamstring Stretch: 3 reps;30 seconds Single Knee to Chest Stretch: 3 reps;30 seconds   Standing Heel Raises: 10 reps Other Standing Lumbar Exercises: toe raise x 10    Supine Ab  Set: 10 reps Bridge: 10 reps    Physical Therapy Assessment and Plan PT Assessment and Plan Clinical Impression Statement: Pt s/p back sugery with decreased right LE strength causing LE to give way.  Pt will benefit from skilled PT to increase LE strength and improve safety and functional mobility. Pt will benefit from skilled therapeutic intervention in order to improve on the following deficits: Decreased activity tolerance;Decreased balance;Difficulty walking;Decreased strength Rehab Potential: Good PT Frequency: Min 2X/week PT Duration: 4 weeks PT Treatment/Interventions: Therapeutic activities;Therapeutic exercise;Balance training PT Plan: begin bike on level 2; functional squat;lateral step ups,  SLR, SL abduction, clam,     Goals Home Exercise Program Pt will Perform Home Exercise Program: Independently PT Short Term Goals PT Short Term Goal 1: Pt strength to be increased by 1/2 grade to decrease how often his leg is giving out on him by 50% PT Short Term Goal 2: Pt to be able to walk with cane for prolong walking;(no longer walking with walker) PT Long Term Goals Time to Complete Long Term Goals: 4 weeks PT Long Term Goal 1: Pt strength to be increased by one grade to allow pt to be able to walk inside without an assistive device PT Long Term Goal 2: Pt to be I in advance HEP to allow the above to occur Long Term Goal 3: Pt to be able to walk up steps in a forwrd position reciprocally.  Problem List Patient Active Problem List  Diagnosis  . KNEE, ARTHRITIS, DEGEN./OSTEO  . DERANGEMENT MENISCUS  . JOINT EFFUSION, LEFT KNEE  . HIP PAIN  . KNEE PAIN  . DEGENERATIVE DISC DISEASE, LUMBOSACRAL SPINE W/RADICULOPATHY  . PLICA SYNDROME  . ANKLE SPRAIN, RIGHT  . ARTHRITIS, LEFT KNEE  . MEDIAL MENISCUS TEAR, RIGHT  . Acute torn meniscus  . Old torn meniscus of knee  . Effusion of knee joint, left  . Knee pain    General Behavior During Session: [facility] for tasks  performed Cognition: [facility] for tasks performed PT Plan of Care PT Home Exercise Plan: given  GP Functional Assessment Tool Used: oswestry Functional Limitation: Mobility: Walking and moving around Mobility: Walking and Moving Around Current Status (A5409): At least 40 percent but less than 60 percent impaired, limited or restricted Mobility: Walking and Moving Around Goal Status [phone]): At least 1 percent but less than 20 percent impaired, limited or restricted  RUSSELL,CINDY 10/13/2012, 2:25 PM  Physician Documentation Your signature is required to indicate approval of the treatment plan as stated above.  Please sign and either send electronically or make a copy of this report for your files and return this physician signed original.   Please mark one 1.__approve of plan  2. ___approve of plan with  the following conditions.   ______________________________                                                          _____________________ Physician Signature                                                                                                             Date

## 2012-10-19 ENCOUNTER — Ambulatory Visit (HOSPITAL_COMMUNITY)
Admission: RE | Admit: 2012-10-19 | Discharge: 2012-10-19 | Disposition: A | Discharge status: Home / Self Care | Payer: Medicare Other | Source: Ambulatory Visit | Attending: *Deleted | Admitting: *Deleted

## 2012-10-19 NOTE — Progress Notes (Signed)
Physical Therapy Treatment Patient Details  Name: Christopher [name] MRN: [id number] Date of Birth: 1956-01-22  Today's Date: 10/19/2012 Time: 7829-5621 PT Time Calculation (min): 40 min  Visit#: 2 of 8  Re-eval: 11/12/12 Charges: Therex x 38'  Authorization: [employer]    Subjective: Symptoms/Limitations Symptoms: Pt states that he had mm spasms in R leg from the knee down this morning that woke him up. Pain Assessment Currently in Pain?: Yes Pain Score:   5 Pain Location: Back Pain Orientation: Lower   Exercise/Treatments Stretches Passive Hamstring Stretch: 1 rep;30 seconds;Limitations Passive Hamstring Stretch Limitations: B w/rope to improve hs stretch Single Knee to Chest Stretch: 1 rep;30 seconds;Limitations Single Knee to Chest Stretch Limitations: B Aerobic Stationary Bike: 8'@2 .0 seat 7 to improve LE strength/activity tolerance Standing Heel Raises: 10 reps;Limitations Heel Raises Limitations: Toe raises x 10 Supine Ab Set: 10 reps Bridge: 10 reps Sidelying Clam: 5 reps;Limitations Clam Limitations: 10" holds Hip Abduction: 10 reps  Physical Therapy Assessment and Plan PT Assessment and Plan Clinical Impression Statement: Pt require multimodal cueing to properly facilitate core contraction with stabilization exercises. Began new therex with minimal difficulty after initial cueing for proper technique. Pt requires multimodal cueing to avoid has contraction with SL clams. Pt reports pain decrease to 4/10 at end of session. PT Plan: Continue to progress LE and core trangth per PT POC.     Problem List Patient Active Problem List  Diagnosis  . KNEE, ARTHRITIS, DEGEN./OSTEO  . DERANGEMENT MENISCUS  . JOINT EFFUSION, LEFT KNEE  . HIP PAIN  . KNEE PAIN  . DEGENERATIVE DISC DISEASE, LUMBOSACRAL SPINE W/RADICULOPATHY  . PLICA SYNDROME  . ANKLE SPRAIN, RIGHT  . ARTHRITIS, LEFT KNEE  . MEDIAL MENISCUS TEAR, RIGHT  . Acute torn meniscus  . Old torn meniscus of knee   . Effusion of knee joint, left  . Knee pain    General Behavior During Session: [facility] for tasks performed Cognition: [facility] for tasks performed  GP Functional Assessment Tool Used: Bertis Ruddy, PTA  10/19/2012, 3:07 PM

## 2012-10-21 ENCOUNTER — Ambulatory Visit (HOSPITAL_COMMUNITY): Payer: Medicare Other | Admitting: Physical Therapy

## 2012-10-26 ENCOUNTER — Ambulatory Visit (HOSPITAL_COMMUNITY): Payer: Medicare Other | Admitting: *Deleted

## 2012-10-27 ENCOUNTER — Ambulatory Visit (HOSPITAL_COMMUNITY)
Admission: RE | Admit: 2012-10-27 | Discharge: 2012-10-27 | Disposition: A | Discharge status: Home / Self Care | Payer: Medicare Other | Source: Ambulatory Visit | Attending: Physical Therapy | Admitting: Physical Therapy

## 2012-10-27 NOTE — Progress Notes (Signed)
Physical Therapy Treatment Patient Details  Name: Christopher [name] MRN: [id number] Date of Birth: November 17, 1955  Today's Date: 10/27/2012 Time: 1300-1350 PT Time Calculation (min): 50 min Visit#: 3 of 8  Re-eval: 11/12/12 Charges:  therex 42'    Subjective: Symptoms/Limitations Symptoms: Pt. states his pain remains around 6/10.  Reports compliance with HEP. Pain Assessment Currently in Pain?: Yes Pain Score:   6 Pain Location: Back Pain Orientation: Lower   Exercise/Treatments Aerobic Stationary Bike: 8'@2 .0 seat 7 to improve LE strength/activity tolerance Standing Heel Raises: 15 reps Heel Raises Limitations: Toe raises x 15 Other Standing Lumbar Exercises: lateral step ups B 10 X 4" each Supine Ab Set: 10 reps Bridge: 15 reps Straight Leg Raise: 10 reps Sidelying Clam: 10 reps Clam Limitations: 10" holds Hip Abduction: 15 reps    Physical Therapy Assessment and Plan PT Assessment and Plan Clinical Impression Statement: Continues to require multimodal cues with therex to keep core contracted and properly engaged during exericses.  Added SLR's with stab and and step ups to strengthen LE's.  Overall pain reduction X 2 levels at end of session. PT Plan: Continue to progress LE and core trangth per PT POC.     Problem List Patient Active Problem List  Diagnosis  . KNEE, ARTHRITIS, DEGEN./OSTEO  . DERANGEMENT MENISCUS  . JOINT EFFUSION, LEFT KNEE  . HIP PAIN  . KNEE PAIN  . DEGENERATIVE DISC DISEASE, LUMBOSACRAL SPINE W/RADICULOPATHY  . PLICA SYNDROME  . ANKLE SPRAIN, RIGHT  . ARTHRITIS, LEFT KNEE  . MEDIAL MENISCUS TEAR, RIGHT  . Acute torn meniscus  . Old torn meniscus of knee  . Effusion of knee joint, left  . Knee pain    General Behavior During Session: [facility] for tasks performed Cognition: [facility] for tasks performed   Lurena Nida, PTA/CLT 10/27/2012, 2:12 PM

## 2012-10-28 ENCOUNTER — Ambulatory Visit (HOSPITAL_COMMUNITY)
Admission: RE | Admit: 2012-10-28 | Discharge: 2012-10-28 | Disposition: A | Discharge status: Home / Self Care | Payer: Medicare Other | Source: Ambulatory Visit | Attending: Neurosurgery | Admitting: Neurosurgery

## 2012-10-28 NOTE — Progress Notes (Signed)
Physical Therapy Treatment Patient Details  Name: Christopher [name] MRN: [id number] Date of Birth: 10/09/1955  Today's Date: 10/28/2012 Time: 6295-2841 PT Time Calculation (min): 41 min Visit#: 4 of 8  Re-eval: 11/12/12 Charges:  therex 39'    Subjective: Symptoms/Limitations Symptoms: Pt states he tinkered around outside alot yesterday but feels pretty good today. Pain 5/10. Pain Assessment Currently in Pain?: Yes Pain Score:   5 Pain Location: Back Pain Orientation: Mid Pain Radiating Towards: numbness R knee to toe anteriorly and posteriorly   Exercise/Treatments Aerobic Stationary Bike: 8' @3 .0, seat 8 Standing Heel Raises: 20 reps;Limitations Heel Raises Limitations: toeraises 20 reps Hip ADduction: Both;10 reps Lateral Step Up: Step Height: 4";Right;15 reps;Hand Hold: 1 Forward Step Up: Step Height: 4";15 reps;Right;Hand Hold: 1 Step Down: Step Height: 2";10 reps;Hand Hold: 1;Right Other Standing Knee Exercises: hip abduction 10 reps each Seated Long Arc Quad: 10 reps;Right;Limitations Long Arc Quad Limitations: 5" holds Other Seated Knee Exercises: sit to stand 10 reps R LE back no UE's      Physical Therapy Assessment and Plan PT Assessment and Plan Clinical Impression Statement: Added forward step ups and downs.  Pt. unable to control descent of step downs with 4" step but able to control with 2".  Pt.'s R LE buckled several times during therapy.  Noted quad weakness.  Added quad strengthening activities to work on increasing eccentric strength.  PT Plan: Continue to progress LE and core strength per PT POC.  Add SLS, wallslides and progress to cybex quad machine.  Pt will need gait belt due to R LE buckling.     Problem List Patient Active Problem List  Diagnosis  . KNEE, ARTHRITIS, DEGEN./OSTEO  . DERANGEMENT MENISCUS  . JOINT EFFUSION, LEFT KNEE  . HIP PAIN  . KNEE PAIN  . DEGENERATIVE DISC DISEASE, LUMBOSACRAL SPINE W/RADICULOPATHY  . PLICA SYNDROME   . ANKLE SPRAIN, RIGHT  . ARTHRITIS, LEFT KNEE  . MEDIAL MENISCUS TEAR, RIGHT  . Acute torn meniscus  . Old torn meniscus of knee  . Effusion of knee joint, left  . Knee pain    PT - End of Session Equipment Utilized During Treatment: Gait belt General Behavior During Session: [facility] for tasks performed Cognition: [facility] for tasks performed   Lurena Nida, PTA/CLT 10/28/2012, 3:42 PM

## 2012-11-02 ENCOUNTER — Inpatient Hospital Stay (HOSPITAL_COMMUNITY): Admission: RE | Admit: 2012-11-02 | Payer: Medicare Other | Source: Ambulatory Visit | Admitting: *Deleted

## 2012-11-04 ENCOUNTER — Ambulatory Visit (HOSPITAL_COMMUNITY)
Admission: RE | Admit: 2012-11-04 | Discharge: 2012-11-04 | Disposition: A | Discharge status: Home / Self Care | Payer: Medicare Other | Source: Ambulatory Visit | Attending: Neurosurgery | Admitting: Neurosurgery

## 2012-11-04 DIAGNOSIS — M25469 Effusion, unspecified knee: Secondary | ICD-10-CM | POA: Insufficient documentation

## 2012-11-04 DIAGNOSIS — IMO0001 Reserved for inherently not codable concepts without codable children: Secondary | ICD-10-CM | POA: Insufficient documentation

## 2012-11-04 DIAGNOSIS — M6281 Muscle weakness (generalized): Secondary | ICD-10-CM | POA: Insufficient documentation

## 2012-11-04 DIAGNOSIS — M545 Low back pain, unspecified: Secondary | ICD-10-CM | POA: Insufficient documentation

## 2012-11-04 DIAGNOSIS — M25569 Pain in unspecified knee: Secondary | ICD-10-CM | POA: Insufficient documentation

## 2012-11-04 NOTE — Progress Notes (Deleted)
Physical Therapy Treatment Patient Details  Name: Christopher [name] MRN: [id number] Date of Birth: 1955-09-09 Charge:  There ex x 40 Today's Date: 11/04/2012 Time: 1478-2956 PT Time Calculation (min): 44 min  Visit#: 5 of 8  Re-eval: 11/12/12   Authorization: [employer]   Subjective: Symptoms/Limitations Symptoms: Pt states he is unable to take any pain meds; doing exercises at home Pain Assessment Pain Score:   5   Exercise/Treatments   Stretches Active Hamstring Stretch: 3 reps;30 seconds Single Knee to Chest Stretch: 3 reps;30 seconds   Standing Heel Raises: 15 reps Functional Squats: 15 reps Wall Slides: 10 reps Other Standing Lumbar Exercises: lateral step ups B 10 X 4" each Other Standing Lumbar Exercises: forward step ups x 10 Seated Sit to Stand: 10 reps Supine Bridge: 15 reps Straight Leg Raise: 10 reps Sidelying Clam: 10 reps Clam Limitations: 10" holds Hip Abduction: 10 reps;Weights Hip Abduction Weights (lbs): 3 Prone  Single Arm Raise: 10 reps Straight Leg Raise: 10 reps Opposite Arm/Leg Raise: 10 reps     Physical Therapy Assessment and Plan PT Assessment and Plan Clinical Impression Statement: added wall squats prone ex as well as sit to stand with verbal cuing needed for proper stab. PT Frequency: Min 2X/week PT Treatment/Interventions: Therapeutic activities;Therapeutic exercise;Balance training PT Plan: begin SLS    Goals Home Exercise Program PT Goal: Perform Home Exercise Program - Progress: Met PT Short Term Goals PT Short Term Goal 1: Pt strength to be increased by 1/2 grade to decrease how often his leg is giving out on him by 50% PT Short Term Goal 1 - Progress: Progressing toward goal PT Short Term Goal 2: Pt to be able to walk with cane for prolong walking;(no longer walking with walker) PT Short Term Goal 2 - Progress: Met PT Long Term Goals PT Long Term Goal 1: Pt strength to be increased by one grade to allow pt to be able to walk  inside without an assistive device PT Long Term Goal 1 - Progress: Met PT Long Term Goal 2: Pt to be I in advance HEP to allow the above to occur PT Long Term Goal 2 - Progress: Progressing toward goal Long Term Goal 3: Pt to be able to walk up steps in a forwrd position reciprocally. Long Term Goal 3 Progress: Progressing toward goal  Problem List Patient Active Problem List  Diagnosis  . KNEE, ARTHRITIS, DEGEN./OSTEO  . DERANGEMENT MENISCUS  . JOINT EFFUSION, LEFT KNEE  . HIP PAIN  . KNEE PAIN  . DEGENERATIVE DISC DISEASE, LUMBOSACRAL SPINE W/RADICULOPATHY  . PLICA SYNDROME  . ANKLE SPRAIN, RIGHT  . ARTHRITIS, LEFT KNEE  . MEDIAL MENISCUS TEAR, RIGHT  . Acute torn meniscus  . Old torn meniscus of knee  . Effusion of knee joint, left  . Knee pain    PT - End of Session Equipment Utilized During Treatment: Gait belt General Behavior During Session: [facility] for tasks performed Cognition: [facility] for tasks performed  GP    RUSSELL,CINDY 11/04/2012, 4:42 PM

## 2012-11-04 NOTE — Progress Notes (Signed)
Physical Therapy Treatment Patient Details  Name: Christopher [name] MRN: [id number] Date of Birth: 07-10-1956  Today's Date: 11/04/2012 Time: 4098-1191 PT Time Calculation (min): 44 min  Visit#: 5 of 8  Re-eval: 11/12/12    Authorization: Susa Simmonds  Authorization Time Period:    Authorization Visit#:   of     Subjective: Symptoms/Limitations Symptoms: Pt states he is unable to take any pain meds Pain Assessment Pain Score:   4      Exercise/Treatments  Stretches Active Hamstring Stretch: 3 reps;30 seconds Single Knee to Chest Stretch: 3 reps;30 seconds Standing Heel Raises: 15 reps Functional Squats: 15 reps Wall Slides: 10 reps Other Standing Lumbar Exercises: lateral step ups B 10 X 4" each Other Standing Lumbar Exercises: forward step ups x 10 Seated Sit to Stand: 10 reps Supine Bridge: 15 reps Straight Leg Raise: 10 reps Sidelying Clam: 10 reps Clam Limitations: 10" holds Hip Abduction: 10 reps;Weights Hip Abduction Weights (lbs): 3 Prone  Single Arm Raise: 10 reps Straight Leg Raise: 10 reps Opposite Arm/Leg Raise: 10 reps Quadruped       Physical Therapy Assessment and Plan PT Assessment and Plan Clinical Impression Statement: added wall squats prone ex as well as sit to stand with verbal cuing needed for proper stab. PT Frequency: Min 2X/week PT Treatment/Interventions: Therapeutic activities;Therapeutic exercise;Balance training PT Plan: begin SLS    Goals Home Exercise Program PT Goal: Perform Home Exercise Program - Progress: Met PT Short Term Goals PT Short Term Goal 1: Pt strength to be increased by 1/2 grade to decrease how often his leg is giving out on him by 50% PT Short Term Goal 1 - Progress: Progressing toward goal PT Short Term Goal 2: Pt to be able to walk with cane for prolong walking;(no longer walking with walker) PT Short Term Goal 2 - Progress: Met PT Long Term Goals PT Long Term Goal 1: Pt strength to be increased by one grade  to allow pt to be able to walk inside without an assistive device PT Long Term Goal 1 - Progress: Met PT Long Term Goal 2: Pt to be I in advance HEP to allow the above to occur PT Long Term Goal 2 - Progress: Progressing toward goal Long Term Goal 3: Pt to be able to walk up steps in a forwrd position reciprocally. Long Term Goal 3 Progress: Progressing toward goal  Problem List Patient Active Problem List  Diagnosis  . KNEE, ARTHRITIS, DEGEN./OSTEO  . DERANGEMENT MENISCUS  . JOINT EFFUSION, LEFT KNEE  . HIP PAIN  . KNEE PAIN  . DEGENERATIVE DISC DISEASE, LUMBOSACRAL SPINE W/RADICULOPATHY  . PLICA SYNDROME  . ANKLE SPRAIN, RIGHT  . ARTHRITIS, LEFT KNEE  . MEDIAL MENISCUS TEAR, RIGHT  . Acute torn meniscus  . Old torn meniscus of knee  . Effusion of knee joint, left  . Knee pain    PT - End of Session Equipment Utilized During Treatment: Gait belt General Behavior During Session: [facility] for tasks performed Cognition: [facility] for tasks performed  GP    RUSSELL,CINDY 11/04/2012, 1:45 PM

## 2012-11-09 ENCOUNTER — Inpatient Hospital Stay (HOSPITAL_COMMUNITY): Admission: RE | Admit: 2012-11-09 | Payer: Medicare Other | Source: Ambulatory Visit | Admitting: *Deleted

## 2012-11-11 ENCOUNTER — Ambulatory Visit (HOSPITAL_COMMUNITY)
Admission: RE | Admit: 2012-11-11 | Discharge: 2012-11-11 | Disposition: A | Discharge status: Home / Self Care | Payer: Medicare Other | Source: Ambulatory Visit | Attending: Neurosurgery | Admitting: Neurosurgery

## 2012-11-11 NOTE — Progress Notes (Signed)
Physical Therapy Treatment Patient Details  Name: Christopher [name] MRN: [id number] Date of Birth: 1956-02-02  Today's Date: 11/11/2012 Time: 1300-1345 PT Time Calculation (min): 45 min  Visit#: 6 of 8  Re-eval: 11/16/12  charge:  There ex x 40  Authorization: [employer]  Authorization Time Period:    Authorization Visit#:   of     Subjective: Symptoms/Limitations Symptoms: Pt states that he still does not feel comfortable walking without his cane. Pain Assessment Currently in Pain?: Yes Pain Score:   4 Pain Location: Back   Exercise/Treatments   Stretches Active Hamstring Stretch: 3 reps;30 seconds Single Knee to Chest Stretch: 3 reps;30 seconds   Standing Wall Slides: 10 reps   (rocker board x 2') Other Standing Lumbar Exercises: B UE flex back against the wall x 5 2# wt. Other Standing Lumbar Exercises: wall push up x 10 (tandem walk x 2; tandem stance x 15" x 3) Seated Sit to Stand: 10 reps (both right and left)   Quadruped Straight Leg Raise: 10 reps Opposite Arm/Leg Raise: 10 reps  tall kneel to sit x 10; tall kneel lean back x 10      Physical Therapy Assessment and Plan PT Assessment and Plan Pt will benefit from skilled therapeutic intervention in order to improve on the following deficits: Decreased activity tolerance;Decreased balance;Difficulty walking;Decreased strength Rehab Potential: Good PT Frequency: Min 2X/week PT Duration: 4 weeks PT Treatment/Interventions: Therapeutic activities;Therapeutic exercise;Balance training PT Plan: begin tandem walk on balance beam; vector stances on foam    Goals Home Exercise Program PT Goal: Perform Home Exercise Program - Progress: Met PT Short Term Goals PT Short Term Goal 1: Pt strength to be increased by 1/2 grade to decrease how often his leg is giving out on him by 50% PT Short Term Goal 1 - Progress: Not met PT Short Term Goal 2: Pt to be able to walk with cane for prolong walking;(no longer walking with  walker) PT Short Term Goal 2 - Progress: Met PT Long Term Goals PT Long Term Goal 1: Pt strength to be increased by one grade to allow pt to be able to walk inside without an assistive device PT Long Term Goal 1 - Progress: Not met PT Long Term Goal 2: Pt to be I in advance HEP to allow the above to occur PT Long Term Goal 2 - Progress: Progressing toward goal Long Term Goal 3: Pt to be able to walk up steps in a forwrd position reciprocally. Long Term Goal 3 Progress: Progressing toward goal  Problem List Patient Active Problem List  Diagnosis  . KNEE, ARTHRITIS, DEGEN./OSTEO  . DERANGEMENT MENISCUS  . JOINT EFFUSION, LEFT KNEE  . HIP PAIN  . KNEE PAIN  . DEGENERATIVE DISC DISEASE, LUMBOSACRAL SPINE W/RADICULOPATHY  . PLICA SYNDROME  . ANKLE SPRAIN, RIGHT  . ARTHRITIS, LEFT KNEE  . MEDIAL MENISCUS TEAR, RIGHT  . Acute torn meniscus  . Old torn meniscus of knee  . Effusion of knee joint, left  . Knee pain    PT - End of Session Equipment Utilized During Treatment: Gait belt General Behavior During Session: [facility] for tasks performed Cognition: [facility] for tasks performed  GP    RUSSELL,CINDY 11/11/2012, 1:47 PM

## 2012-11-17 ENCOUNTER — Ambulatory Visit (HOSPITAL_COMMUNITY)
Admission: RE | Admit: 2012-11-17 | Discharge: 2012-11-17 | Disposition: A | Discharge status: Home / Self Care | Payer: Medicare Other | Source: Ambulatory Visit | Attending: Pulmonary Disease | Admitting: Pulmonary Disease

## 2012-11-17 NOTE — Evaluation (Signed)
Physical Therapy Re-evaluation  Patient Details  Name: Christopher [name] MRN: [id number] Date of Birth: 1956/02/06  Today's Date: 11/17/2012 Time: 1302-1323 PT Time Calculation (min): 21 min              Visit#: 7 of 8  Re-eval: 11/16/12 Charges: MMT x 1 ROMM x 1   Authorization: [employer]      Past Medical History:  Past Medical History  Diagnosis Date  . Hyperlipidemia   . Anxiety   . Depression   . GERD (gastroesophageal reflux disease)   . Sleep apnea     uses CIPAP machine at night  . Heart murmur     Years ago- not now  . Headache     after surgery  . Complication of anesthesia     pt had a hard time being able to move after spinal anesthesia , 3-4 hours  . PONV (postoperative nausea and vomiting)   . Arthritis    Past Surgical History:  Past Surgical History  Procedure Laterality Date  . Knee arthroscopy      left knee  . Knee arthroscopy      right knee   . Spinal fusion      x 2  . Neck fusion    . Back surgery    . Colonoscopy  06/27/2011    Procedure: COLONOSCOPY;  Surgeon: Malissa Hippo, MD;  Location: [facility] ENDO SUITE;  Service: Endoscopy;  Laterality: N/A;  9:00 / Pt to be here at 9am for 10:45 procedure, benign polyps removed  . Appendectomy    . Hernia repair      umbilical hernia  . Cardiac catheterization    . Chondroplasty  08/15/2011    Procedure: CHONDROPLASTY;  Surgeon: Fuller Canada, MD;  Location: [facility] ORS;  Service: Orthopedics;  Laterality: Left;  . Lumbar laminectomy/decompression microdiscectomy  09/14/2012    Procedure: LUMBAR LAMINECTOMY/DECOMPRESSION MICRODISCECTOMY 1 LEVEL;  Surgeon: Karn Cassis, MD;  Location: [facility] NEURO ORS;  Service: Neurosurgery;  Laterality: Right;  Right Lumbar three-four Diskectomy    Subjective Symptoms/Limitations Symptoms: Pt states that he is feeling stronger. Pain Assessment Currently in Pain?: No/denies was 5/10   Sensation/Coordination/Flexibility/Functional Tests Functional Tests Functional  Tests: Oswestry 0  Assessment RLE Strength Right Hip Flexion: 5/5 was 3-/5 Right Hip Extension: 5/5 was 3 Right Hip ABduction: 5/5 was 5 Right Hip ADduction: 5/5 was 3+ Right Knee Flexion: 5/5 was 2+ Right Knee Extension: 5/5 was 3 Right Ankle Dorsiflexion: 5/5 LLE Strength Left Hip Flexion: 5/5 was 5 Left Hip Extension: 5/5 was 4 Left Hip ABduction: 5/5 was 5 Left Hip ADduction: 5/5 was 4 Left Knee Flexion: 5/5 was 5 Left Knee Extension: 5/5 was 5 Left Ankle Dorsiflexion: 5/5 was 4  Physical Therapy Assessment and Plan PT Assessment and Plan Clinical Impression Statement: Pt has progressed wellwith therapy. Pt presents with 5/5 strength throuhgout BLE. Pt also scored a 0 on the Oswestry scale. All goals have been met. Pt is comfortable with D/C to HEP. PT Plan: Recommend D/C to HEP.    Goals Home Exercise Program Pt will Perform Home Exercise Program: Independently PT Goal: Perform Home Exercise Program - Progress: Not met PT Short Term Goals PT Short Term Goal 1: Pt strength to be increased by 1/2 grade to decrease how often his leg is giving out on him by 50% PT Short Term Goal 1 - Progress: Met PT Short Term Goal 2: Pt to be able to walk with cane for prolong walking;(no  longer walking with walker) PT Short Term Goal 2 - Progress: Met PT Long Term Goals PT Long Term Goal 1: Pt strength to be increased by one grade to allow pt to be able to walk inside without an assistive device PT Long Term Goal 1 - Progress: Met PT Long Term Goal 2: Pt to be I in advance HEP to allow the above to occur PT Long Term Goal 2 - Progress: Met Long Term Goal 3: Pt to be able to walk up steps in a forwrd position reciprocally. Long Term Goal 3 Progress: Met  Problem List Patient Active Problem List  Diagnosis  . KNEE, ARTHRITIS, DEGEN./OSTEO  . DERANGEMENT MENISCUS  . JOINT EFFUSION, LEFT KNEE  . HIP PAIN  . KNEE PAIN  . DEGENERATIVE DISC DISEASE, LUMBOSACRAL SPINE W/RADICULOPATHY  .  PLICA SYNDROME  . ANKLE SPRAIN, RIGHT  . ARTHRITIS, LEFT KNEE  . MEDIAL MENISCUS TEAR, RIGHT  . Acute torn meniscus  . Old torn meniscus of knee  . Effusion of knee joint, left  . Knee pain    PT - End of Session Equipment Utilized During Treatment: Gait belt General Behavior During Session: [facility] for tasks performed Cognition: [facility] for tasks performed  Seth Bake, PTA 11/17/2012, 1:36 PM  Physician Documentation Your signature is required to indicate approval of the treatment plan as stated above.  Please sign and either send electronically or make a copy of this report for your files and return this physician signed original.   Please mark one 1.__approve of plan  2. ___approve of plan with the following conditions.   ______________________________                                                          _____________________ Physician Signature                                                                                                             Date

## 2012-11-19 ENCOUNTER — Ambulatory Visit (HOSPITAL_COMMUNITY): Payer: Medicare Other | Admitting: Physical Therapy

## 2012-11-23 ENCOUNTER — Ambulatory Visit (HOSPITAL_COMMUNITY): Payer: Medicare Other | Admitting: Physical Therapy

## 2012-11-24 ENCOUNTER — Ambulatory Visit (INDEPENDENT_AMBULATORY_CARE_PROVIDER_SITE_OTHER): Payer: Medicare Other | Admitting: Orthopedic Surgery

## 2012-11-24 VITALS — BP 130/80 | Ht 66.0 in | Wt 211.0 lb

## 2012-11-24 DIAGNOSIS — M25561 Pain in right knee: Secondary | ICD-10-CM

## 2012-11-24 DIAGNOSIS — M1710 Unilateral primary osteoarthritis, unspecified knee: Secondary | ICD-10-CM

## 2012-11-24 DIAGNOSIS — M25569 Pain in unspecified knee: Secondary | ICD-10-CM

## 2012-11-24 DIAGNOSIS — M25469 Effusion, unspecified knee: Secondary | ICD-10-CM

## 2012-11-24 DIAGNOSIS — M25462 Effusion, left knee: Secondary | ICD-10-CM

## 2012-11-24 NOTE — Patient Instructions (Addendum)
You have received a steroid shot. 15% of patients experience increased pain at the injection site with in the next 24 hours. This is best treated with ice and tylenol extra strength 2 tabs every 8 hours. If you are still having pain please call the office.    

## 2012-11-24 NOTE — Progress Notes (Signed)
Patient ID: NORVIN OHLIN, male   DOB: 1956/08/15, 57 y.o.   MRN: [id number] Chief Complaint  Patient presents with  . Joint Swelling    Right knee swelling    Attempted aspiration unsuccessful  Injected knee  Ligaments stable   Quads weak   Hip flexors weak   Medial leg numb   Knee  Injection and aspiration Procedure Note  Pre-operative Diagnosis: left knee oa, effusion  Post-operative Diagnosis: same  Indications: pain, swelling  Anesthesia: ethyl chloride   Procedure Details   Verbal consent was obtained for the procedure. Time out was completed.The joint was prepped with alcohol, followed by  Ethyl chloride spray and The 18-gauge needle was inserted into the joint via lateral approach and we aspirated approximately 0 cc of  fluid  This was followed by the injection of 4ml 1% lidocaine and 1 ml of depomedrol  was then injected into the joint . The needle was removed and the area cleansed and dressed.  Complications:  None; patient tolerated the procedure well.

## 2012-11-25 ENCOUNTER — Ambulatory Visit (HOSPITAL_COMMUNITY): Payer: Medicare Other | Admitting: Physical Therapy

## 2012-11-30 ENCOUNTER — Ambulatory Visit (HOSPITAL_COMMUNITY): Payer: Medicare Other | Admitting: Physical Therapy

## 2012-12-02 ENCOUNTER — Ambulatory Visit (HOSPITAL_COMMUNITY): Payer: Medicare Other | Admitting: Physical Therapy

## 2012-12-07 ENCOUNTER — Ambulatory Visit (HOSPITAL_COMMUNITY): Payer: Medicare Other | Admitting: Physical Therapy

## 2012-12-09 ENCOUNTER — Ambulatory Visit (HOSPITAL_COMMUNITY): Payer: Medicare Other | Admitting: Physical Therapy

## 2013-01-07 ENCOUNTER — Other Ambulatory Visit: Payer: Self-pay | Admitting: Neurosurgery

## 2013-01-07 DIAGNOSIS — M5410 Radiculopathy, site unspecified: Secondary | ICD-10-CM

## 2013-01-07 DIAGNOSIS — M549 Dorsalgia, unspecified: Secondary | ICD-10-CM

## 2013-01-12 ENCOUNTER — Other Ambulatory Visit: Payer: Medicare Other

## 2013-01-12 ENCOUNTER — Inpatient Hospital Stay
Admission: RE | Admit: 2013-01-12 | Discharge: 2013-01-12 | Disposition: A | Discharge status: Home / Self Care | Payer: Medicare Other | Source: Ambulatory Visit | Attending: Neurosurgery | Admitting: Neurosurgery

## 2013-01-14 ENCOUNTER — Ambulatory Visit
Admission: RE | Admit: 2013-01-14 | Discharge: 2013-01-14 | Disposition: A | Discharge status: Home / Self Care | Payer: Medicare Other | Source: Ambulatory Visit | Attending: Neurosurgery | Admitting: Neurosurgery

## 2013-01-14 VITALS — BP 132/85 | HR 62

## 2013-01-14 DIAGNOSIS — M5410 Radiculopathy, site unspecified: Secondary | ICD-10-CM

## 2013-01-14 DIAGNOSIS — M549 Dorsalgia, unspecified: Secondary | ICD-10-CM

## 2013-01-14 MED ORDER — DIAZEPAM 5 MG PO TABS
10.0000 mg | ORAL_TABLET | Freq: Once | ORAL | Status: AC
  Administered 2013-01-14: 10 mg via ORAL

## 2013-01-14 MED ORDER — IOHEXOL 180 MG/ML  SOLN
15.0000 mL | Freq: Once | INTRAMUSCULAR | Status: AC | PRN
  Administered 2013-01-14: 15 mL via INTRATHECAL

## 2013-01-14 NOTE — Progress Notes (Signed)
Patient states he has been off Zoloft for the past four days.  Discharge instructions explained to patient.  jkl

## 2013-02-09 ENCOUNTER — Other Ambulatory Visit (HOSPITAL_COMMUNITY): Payer: Self-pay | Admitting: Pulmonary Disease

## 2013-02-09 ENCOUNTER — Ambulatory Visit (HOSPITAL_COMMUNITY)
Admission: RE | Admit: 2013-02-09 | Discharge: 2013-02-09 | Disposition: A | Discharge status: Home / Self Care | Payer: Medicare Other | Source: Ambulatory Visit | Attending: Pulmonary Disease | Admitting: Pulmonary Disease

## 2013-02-09 DIAGNOSIS — I10 Essential (primary) hypertension: Secondary | ICD-10-CM | POA: Insufficient documentation

## 2013-02-09 DIAGNOSIS — F17200 Nicotine dependence, unspecified, uncomplicated: Secondary | ICD-10-CM | POA: Insufficient documentation

## 2013-02-09 DIAGNOSIS — R079 Chest pain, unspecified: Secondary | ICD-10-CM

## 2013-03-08 ENCOUNTER — Other Ambulatory Visit: Payer: Self-pay | Admitting: Orthopedic Surgery

## 2013-03-08 ENCOUNTER — Ambulatory Visit (HOSPITAL_COMMUNITY)
Admission: RE | Admit: 2013-03-08 | Discharge: 2013-03-08 | Disposition: A | Discharge status: Home / Self Care | Payer: Medicare Other | Source: Ambulatory Visit | Attending: Orthopedic Surgery | Admitting: Orthopedic Surgery

## 2013-03-08 DIAGNOSIS — M25511 Pain in right shoulder: Secondary | ICD-10-CM

## 2013-03-08 DIAGNOSIS — M25519 Pain in unspecified shoulder: Secondary | ICD-10-CM | POA: Insufficient documentation

## 2013-03-10 ENCOUNTER — Ambulatory Visit (INDEPENDENT_AMBULATORY_CARE_PROVIDER_SITE_OTHER): Payer: Medicare Other | Admitting: Orthopedic Surgery

## 2013-03-10 ENCOUNTER — Encounter: Payer: Self-pay | Admitting: Orthopedic Surgery

## 2013-03-10 VITALS — BP 130/80 | Ht 66.0 in | Wt 222.0 lb

## 2013-03-10 DIAGNOSIS — M7551 Bursitis of right shoulder: Secondary | ICD-10-CM

## 2013-03-10 DIAGNOSIS — M719 Bursopathy, unspecified: Secondary | ICD-10-CM

## 2013-03-10 MED ORDER — HYDROCODONE-ACETAMINOPHEN 10-325 MG PO TABS: 1.0000 | ORAL_TABLET | ORAL | Status: DC | PRN

## 2013-03-10 NOTE — Patient Instructions (Addendum)
You have received a steroid shot. 15% of patients experience increased pain at the injection site with in the next 24 hours. This is best treated with ice and tylenol extra strength 2 tabs every 8 hours. If you are still having pain please call the office.   Rest x 2 weeks

## 2013-03-10 NOTE — Progress Notes (Signed)
Patient ID: Christopher [name], male   DOB: November 04, 1955, 57 y.o.   MRN: [id number] Chief Complaint  Patient presents with  . Follow-up    Recheck right shoulder pain.    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 No numbness or tingling no weakness but decreased range of motion. Slight improvement with the use of the sling  Past Medical History  Diagnosis Date  . Hyperlipidemia   . Anxiety   . Depression   . GERD (gastroesophageal reflux disease)   . Sleep apnea     uses CIPAP machine at night  . Heart murmur     Years ago- not now  . Headache(784.0)     after surgery  . Complication of anesthesia     pt had a hard time being able to move after spinal anesthesia , 3-4 hours  . PONV (postoperative nausea and vomiting)   . Arthritis    Review of Systems  Musculoskeletal: Positive for myalgias, back pain and joint pain.  Neurological: Negative for tingling.   BP 130/80  Ht 5\' 6"  (1.676 m)  Wt 222 lb (100.699 kg)  BMI 35.85 kg/m2 General appearance is normal, the patient is alert and oriented x3 with normal mood and affect. GAIT IS NORMAL   Right Shoulder Exam   Tenderness  The patient is experiencing tenderness in the acromioclavicular joint and clavicle.  Range of Motion  Active Abduction:  60 abnormal  Passive Abduction:  80 abnormal  Extension: abnormal  Forward Flexion:  100 abnormal  External Rotation:  50 normal   Muscle Strength  Abduction: 5/5  Internal Rotation: 5/5  External Rotation: 5/5  Supraspinatus: 5/5   Tests  Apprehension: negative Cross Arm: negative Drop Arm: negative Hawkin's test: positive Impingement: positive Sulcus: absent  Other  Erythema:  absent Scars: present Sensation: normal Pulse: present  Comments:  Open excision is noted. No problems with healing.   Left Shoulder Exam   Comments:  Left shoulder range of motion strength stability alignment normal no tenderness.     x-ray was done at [facility] shows previous distal clavicle adequate length no instability noted at the coracoclavicular ligament area.  Impression  Encounter Diagnosis  Name Primary?  . Bursitis/tendonitis, shoulder, right Yes    Subacromial Shoulder Injection Procedure Note  Pre-operative Diagnosis: left RC Syndrome  Post-operative Diagnosis: same  Indications: pain   Anesthesia: ethyl chloride   Procedure Details   Verbal consent was obtained for the procedure. The shoulder was prepped withalcohol and the skin was anesthetized. A 20 gauge needle was advanced into the subacromial space through posterior approach without difficulty  The space was then injected with 3 ml 1% lidocaine and 1 ml of depomedrol. The injection site was cleansed with isopropyl alcohol and a dressing was applied.  Complications:  None; patient tolerated the procedure well.  REST NORCO 10/325

## 2013-03-29 ENCOUNTER — Ambulatory Visit (INDEPENDENT_AMBULATORY_CARE_PROVIDER_SITE_OTHER): Payer: Medicare Other | Admitting: Orthopedic Surgery

## 2013-03-29 DIAGNOSIS — M1711 Unilateral primary osteoarthritis, right knee: Secondary | ICD-10-CM | POA: Insufficient documentation

## 2013-03-29 DIAGNOSIS — M25469 Effusion, unspecified knee: Secondary | ICD-10-CM | POA: Insufficient documentation

## 2013-03-29 DIAGNOSIS — M7651 Patellar tendinitis, right knee: Secondary | ICD-10-CM

## 2013-03-29 DIAGNOSIS — M7650 Patellar tendinitis, unspecified knee: Secondary | ICD-10-CM

## 2013-03-29 DIAGNOSIS — M25461 Effusion, right knee: Secondary | ICD-10-CM

## 2013-03-29 NOTE — Patient Instructions (Addendum)
You have received a steroid shot. 15% of patients experience increased pain at the injection site with in the next 24 hours. This is best treated with ice and tylenol extra strength 2 tabs every 8 hours. If you are still having pain please call the office.    

## 2013-03-29 NOTE — Progress Notes (Signed)
Patient ID: Christopher [name], male   DOB: 1956/02/15, 57 y.o.   MRN: [id number] Chief Complaint  Patient presents with  . Knee Problem    Four-day history of acute pain in the right knee    There were no vitals taken for this visit.  Encounter Diagnoses  Name Primary?  . Effusion of knee joint, right Yes  . Patellar tendinitis, right     Christopher [name] presents with no history of trauma but acute severe pain in his right knee associated with swelling loss of motion and inability to bear weight. He's lost a significant amount of range of motion and severe pain when he tries to stand on his knee. He can hear something moving in the knee and a crunching noise  Review of systems discussed and shoulder pain which he received an injection and didn't help his painful shoulder is still bothering him and he has lost motion as well as strength and.  He is awake and alert he is oriented x3 his mood and affect is normal his overall appearance is normal. Exam bleeding with a cane and significant limp of the right leg he has a 60 flexion ARC in the right knee with pain he has tenderness over the patellar tendon and parapatellar structures with a large joint effusion. The knee remains stable strength is normal but he has weakness of extension against resistance. No skin rash no laceration. He has good pulse and normal sensation in the foot with no lymphadenopathy or streaking  Impression a fusion hospital patella tendinitis  Recommend aspiration injection and steroid Dosepak if he can tolerate it  Followup one week use wrap on hinged knee brace    Aspiraticlearon RIGHT knee  Verbal consent  Time out  Lateral approach  Alcohol prep.  Ethyl chloride anesthesia  Needle was introduced through the lateral suprapatellar approach.  We aspirated 15 cc of  clear fluid  No complications

## 2013-06-21 ENCOUNTER — Ambulatory Visit (INDEPENDENT_AMBULATORY_CARE_PROVIDER_SITE_OTHER): Payer: Medicare Other | Admitting: Orthopedic Surgery

## 2013-06-21 ENCOUNTER — Encounter: Payer: Self-pay | Admitting: Orthopedic Surgery

## 2013-06-21 VITALS — BP 138/82 | Ht 66.0 in | Wt 222.0 lb

## 2013-06-21 DIAGNOSIS — M129 Arthropathy, unspecified: Secondary | ICD-10-CM

## 2013-06-21 DIAGNOSIS — M67919 Unspecified disorder of synovium and tendon, unspecified shoulder: Secondary | ICD-10-CM

## 2013-06-21 DIAGNOSIS — M75102 Unspecified rotator cuff tear or rupture of left shoulder, not specified as traumatic: Secondary | ICD-10-CM | POA: Insufficient documentation

## 2013-06-21 DIAGNOSIS — M1990 Unspecified osteoarthritis, unspecified site: Secondary | ICD-10-CM

## 2013-06-21 MED ORDER — DICLOFENAC-MISOPROSTOL 50-0.2 MG PO TBEC: 1.0000 | DELAYED_RELEASE_TABLET | Freq: Two times a day (BID) | ORAL | Status: DC

## 2013-06-21 NOTE — Progress Notes (Signed)
Patient ID: Christopher [name], male   DOB: 1955/10/25, 57 y.o.   MRN: [id number]  Chief Complaint  Patient presents with  . Follow-up    Recurring left shoulder pain, no new injury    HISTORY: 57 year old male status post arthroscopy of the right shoulder presents with pain in the left shoulder over the last few weeks associated without trauma but associated with difficulty raising his arm weakness in the rotator cuff pain over the left. Acromial region and inability to sleep on the left side  Review of Systems  Musculoskeletal: Positive for back pain and neck pain.  Neurological: Negative for tingling and sensory change.    General appearance is normal, the patient is alert and oriented x3 with normal mood and affect. Blood pressure 138/82, height 5\' 6"  (1.676 m), weight 222 lb (100.699 kg). Right shoulder sore tender but rom is normal strenght is normal stability tests were normal   Left Shoulder Exam   Tenderness  The patient is experiencing tenderness in the acromion and biceps tendon.  Range of Motion  Active Abduction:  80 abnormal  Passive Abduction: abnormal  Extension: normal  Forward Flexion:  120 abnormal  External Rotation: normal   Muscle Strength  Abduction: 4/5  Internal Rotation: 5/5  External Rotation: 5/5  Supraspinatus: 4/5  Subscapularis: 5/5  Biceps: 5/5   Tests  Apprehension: negative Cross Arm: negative Drop Arm: negative Hawkin's test: positive Impingement: positive Sulcus: absent  Other  Erythema: absent Scars: absent Sensation: normal Pulse: present   Comments:  Reflexes are normal     Subacromial Shoulder Injection Procedure Note  Pre-operative Diagnosis: left RC Syndrome  Post-operative Diagnosis: same  Indications: pain   Anesthesia: ethyl chloride   Procedure Details   Verbal consent was obtained for the procedure. The shoulder was prepped withalcohol and the skin was anesthetized. A 20 gauge needle was advanced into the  subacromial space through posterior approach without difficulty  The space was then injected with 3 ml 1% lidocaine and 1 ml of depomedrol. The injection site was cleansed with isopropyl alcohol and a dressing was applied.  Complications:  None; patient tolerated the procedure well.

## 2013-06-21 NOTE — Patient Instructions (Signed)
You have received a steroid shot. 15% of patients experience increased pain at the injection site with in the next 24 hours. This is best treated with ice and tylenol extra strength 2 tabs every 8 hours. If you are still having pain please call the office.    

## 2013-07-25 ENCOUNTER — Telehealth: Payer: Self-pay | Admitting: Orthopedic Surgery

## 2013-07-25 NOTE — Telephone Encounter (Signed)
Routing to Dr Harrison 

## 2013-07-25 NOTE — Telephone Encounter (Signed)
Patient / patient's wife called to relay that he is still having problems with left shoulder.  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  Schedule for another appointment with repeat injection, or other recommendation?  Ph# [phone].

## 2013-08-03 ENCOUNTER — Telehealth: Payer: Self-pay | Admitting: *Deleted

## 2013-08-03 NOTE — Telephone Encounter (Signed)
Patient's wife called back stating patient is still having a lot of pain in his left shoulder. She states patient is icing, however, he was not able to tolerate the Diclofenac-Misoprostol  that you gave him, due to having stomach pains. I advised that I thought it was too early for injection, but I would put the note in for you to advise.

## 2013-08-04 ENCOUNTER — Other Ambulatory Visit: Payer: Self-pay | Admitting: *Deleted

## 2013-08-04 DIAGNOSIS — M75102 Unspecified rotator cuff tear or rupture of left shoulder, not specified as traumatic: Secondary | ICD-10-CM

## 2013-08-04 NOTE — Telephone Encounter (Signed)
MRI ordered, made patient's wife aware of the process for that and that [facility] would be in touch with them after approved.

## 2013-08-04 NOTE — Telephone Encounter (Signed)
Set him up for mri shoulder  rct

## 2013-08-10 NOTE — Telephone Encounter (Signed)
Patient is having an MRI at [facility] on 08-20-13 at 8:45. Patient has [facility] [employer], authorization # [id number] and it expires on 09-22-13. Dr. Romeo Apple will call the patient with his results.

## 2013-08-20 ENCOUNTER — Ambulatory Visit
Admission: RE | Admit: 2013-08-20 | Discharge: 2013-08-20 | Disposition: A | Discharge status: Home / Self Care | Payer: Medicare Other | Source: Ambulatory Visit | Attending: Orthopedic Surgery | Admitting: Orthopedic Surgery

## 2013-08-20 DIAGNOSIS — M75102 Unspecified rotator cuff tear or rupture of left shoulder, not specified as traumatic: Secondary | ICD-10-CM

## 2013-08-23 ENCOUNTER — Telehealth: Payer: Self-pay | Admitting: Orthopedic Surgery

## 2013-08-23 NOTE — Telephone Encounter (Signed)
Patient (wife Christopher [name]) brought in patient's MRI film of left shoulder, done at [facility] 08/20/13.  Patient is not scheduled at this time for follow up appointment.  Please review film and report (both in your box, also in [facility] chart.)  Patient's ph# is [phone].

## 2013-08-31 ENCOUNTER — Telehealth: Payer: Self-pay | Admitting: Orthopedic Surgery

## 2013-08-31 ENCOUNTER — Other Ambulatory Visit: Payer: Self-pay | Admitting: *Deleted

## 2013-08-31 NOTE — Telephone Encounter (Signed)
IMPRESSION: 1. Glenohumeral degenerative changes with diffuse irregularity of the posterior superior labrum suspicious for degenerative tearing. 2. Tendinosis of the intra-articular portion of the biceps tendon. 3. Partial articular surface insertional tear of the subscapularis tendon. No full-thickness tendon tear or focal muscular atrophy. 4. Joint capsular edema suggesting possible adhesive capsulitis.   Christopher [name] has been given the results of his shoulder MRI. He has basically arthritis and tendinitis no full-thickness tear. He is aware that some of this pain is probably coming from his neck condition and they will not get full relief from arthroscopy but he is willing to proceed when given the option.  Recommend surgical arthroscopy shoulder, left

## 2013-09-02 ENCOUNTER — Other Ambulatory Visit: Payer: Self-pay | Admitting: *Deleted

## 2013-09-08 ENCOUNTER — Encounter (HOSPITAL_COMMUNITY): Payer: Self-pay | Admitting: Pharmacy Technician

## 2013-09-13 ENCOUNTER — Telehealth: Payer: Self-pay | Admitting: Orthopedic Surgery

## 2013-09-13 NOTE — Telephone Encounter (Addendum)
Regarding Out-Patient surgery scheduled 09/23/13 at [facility], [place], called insurer, [employer], ph# [phone]; per Clydia Llano, no pre-authorization is required for in-network providers for this procedure code. Her name and today's date 09/13/13, 4:25p.m. *Addendum, include CPT [phone] (no pre-authorization as noted)

## 2013-09-16 NOTE — Patient Instructions (Signed)
Christopher [name]  09/16/2013   Your procedure is scheduled on:  09/23/2013  Report to [facility] at  45  AM.  Call this number if you have problems the morning of surgery: [phone]   Remember:   Do not eat food or drink liquids after midnight.   Take these medicines the morning of surgery with A SIP OF WATER:  Xanax, norco, flexaril, protonix, zoloft   Do not wear jewelry, make-up or nail polish.  Do not wear lotions, powders, or perfumes.   Do not shave 48 hours prior to surgery. Men may shave face and neck.  Do not bring valuables to the hospital.  [facility] is not responsible for any belongings or valuables.               Contacts, dentures or bridgework may not be worn into surgery.  Leave suitcase in the car. After surgery it may be brought to your room.  For patients admitted to the hospital, discharge time is determined by your treatment team.               Patients discharged the day of surgery will not be allowed to drive home.  Name and phone number of your driver: family  Special Instructions: Shower using CHG 2 nights before surgery and the night before surgery.  If you shower the day of surgery use CHG.  Use special wash - you have one bottle of CHG for all showers.  You should use approximately 1/3 of the bottle for each shower.   Please read over the following fact sheets that you were given: Pain Booklet, Coughing and Deep Breathing, Surgical Site Infection Prevention, Anesthesia Post-op Instructions and Care and Recovery After Surgery Shoulder Arthroscopy Because the shoulder is one of the most mobile joints, it is more prone to injury. It is a very shallow ball and socket joint located between the large bone in your upper arm (humerus) and the shoulder blade (scapula). Arthroscopy is a valuable test for evaluating and treating injuries involving the shoulder joint. Arthroscopy is a surgical technique which uses small incisions (cuts by the surgeon) to insert  a small telescope like instrument (arthroscope) and other tools into the shoulder. This allows the surgeon to look directly at the problem. When the arthroscope is in the joint, fluid is used to expand the joint space. This allows the surgeon to examine it more easily. The arthroscope then beams light into the joint and sends an image to a TV screen. As your surgeon examines your shoulder, he or she can also repair a number of problems found at the same time. Sometimes the procedure may change to an open surgery. This would happen if the problems are severe enough that they cannot be corrected with just arthroscopy. This is usually a very safe surgery. Rare complications include damage to nerves or blood vessels, excess bleeding, blood clots, infection, and rarely instrument failure. This is most often performed as a same day surgery. This means you will not have to stay in the hospital overnight. Recovery from this surgery is also much faster than having an open procedure. LET YOUR CAREGIVER KNOW ABOUT:  Allergies.  Medications taken including herbs, eye drops, over the counter medications, and creams.  Use of steroids (by mouth or creams).  Previous problems with anesthetics or novocaine.  Possibility of pregnancy, if this applies.  History of blood clots (thrombophlebitis).  History of bleeding or blood problems.  Previous surgery.  Other health problems.  Family history of anesthetic problems. BEFORE THE PROCEDURE   Stop all anti-inflammatory medications at least one week before surgery unless instructed otherwise. Tell your surgeon if you have been taking cortisone or other steroids.  Do not eat or drink after midnight or as instructed. Take medications as directed by your caregiver. You may have lab tests the morning of surgery.  You should be present 60 minutes prior to your procedure or as directed. PROCEDURE  You may have general (go to sleep) or local (numb the area)  anesthetic. Your surgeon will discuss this with you. During the procedure as discussed above, your surgeon may find a variety of problems which he or she can improve or correct using small instruments. When the procedure is finished the tiny incisions will be closed with stitches or tape. AFTER YOUR PROCEDURE  After surgery you will be taken to the recovery area. A nurse will watch and check your progress. Once you are awake, stable, and taking fluids well, barring other problems you will be allowed to go home.  Once home, apply an ice pack to your operative site for twenty minutes, three to four times per day, for two to three days. This may help with discomfort and keep the swelling down.  Use a sling and medications if prescribed or as instructed.  Unless your caregiver advises otherwise, move your arm and shoulder gently and frequently following the procedure. This can help prevent stiffness and swelling. REHABILITATION  Almost as important as your surgery is your rehabilitation. If physical therapy and exercises are prescribed by your surgeon, follow them diligently. Once comfortable and on your way to full use, do muscle strengthening exercises as instructed.  Only take over-the-counter or prescription medicines for pain, discomfort, or fever as directed by your caregiver. SEEK IMMEDIATE MEDICAL CARE IF:   There is redness, swelling, or increasing pain in the wound or joint.  You notice purulent (colored- pus-like) drainage coming from the wound.  An unexplained oral temperature above 102 F (38.9 C) develops.  You notice a foul smell coming from the wound or dressing.  There is a breaking open of the wound. The edges do not stay together after sutures or tape has been removed.  Persistent bleeding from the small incision. Document Released: 08/15/2000 Document Revised: 11/10/2011 Document Reviewed: 12/04/2008 [facility] Patient Information 2014 [place]. PATIENT  INSTRUCTIONS POST-ANESTHESIA  IMMEDIATELY FOLLOWING SURGERY:  Do not drive or operate machinery for the first twenty four hours after surgery.  Do not make any important decisions for twenty four hours after surgery or while taking narcotic pain medications or sedatives.  If you develop intractable nausea and vomiting or a severe headache please notify your doctor immediately.  FOLLOW-UP:  Please make an appointment with your surgeon as instructed. You do not need to follow up with anesthesia unless specifically instructed to do so.  WOUND CARE INSTRUCTIONS (if applicable):  Keep a dry clean dressing on the anesthesia/puncture wound site if there is drainage.  Once the wound has quit draining you may leave it open to air.  Generally you should leave the bandage intact for twenty four hours unless there is drainage.  If the epidural site drains for more than 36-48 hours please call the anesthesia department.  QUESTIONS?:  Please feel free to call your physician or the hospital operator if you have any questions, and they will be happy to assist you.

## 2013-09-19 ENCOUNTER — Encounter (HOSPITAL_COMMUNITY): Payer: Self-pay

## 2013-09-19 ENCOUNTER — Encounter (HOSPITAL_COMMUNITY)
Admission: RE | Admit: 2013-09-19 | Discharge: 2013-09-19 | Disposition: A | Discharge status: Home / Self Care | Payer: Medicare Other | Source: Ambulatory Visit | Attending: Orthopedic Surgery | Admitting: Orthopedic Surgery

## 2013-09-19 ENCOUNTER — Other Ambulatory Visit: Payer: Self-pay

## 2013-09-19 DIAGNOSIS — Z01818 Encounter for other preprocedural examination: Secondary | ICD-10-CM | POA: Insufficient documentation

## 2013-09-19 DIAGNOSIS — Z01810 Encounter for preprocedural cardiovascular examination: Secondary | ICD-10-CM | POA: Insufficient documentation

## 2013-09-19 DIAGNOSIS — Z01812 Encounter for preprocedural laboratory examination: Secondary | ICD-10-CM | POA: Insufficient documentation

## 2013-09-19 HISTORY — DX: Type 2 diabetes mellitus without complications: E11.9

## 2013-09-19 LAB — BASIC METABOLIC PANEL
BUN: 16 mg/dL (ref 6–23)
CO2: 27 mEq/L (ref 19–32)
CREATININE: 1.05 mg/dL (ref 0.50–1.35)
Calcium: 9.6 mg/dL (ref 8.4–10.5)
Chloride: 100 mEq/L (ref 96–112)
GFR, EST NON AFRICAN AMERICAN: 78 mL/min — AB (ref >90)
Glucose, Bld: 143 mg/dL — ABNORMAL HIGH (ref 70–99)
POTASSIUM: 4.4 meq/L (ref 3.7–5.3)
Sodium: 139 mEq/L (ref 137–147)

## 2013-09-19 LAB — HEMOGLOBIN AND HEMATOCRIT, BLOOD
HCT: 37.4 % — ABNORMAL LOW (ref 39.0–52.0)
HEMOGLOBIN: 13.7 g/dL (ref 13.0–17.0)

## 2013-09-19 LAB — SURGICAL PCR SCREEN
MRSA, PCR: NEGATIVE
Staphylococcus aureus: POSITIVE — AB

## 2013-09-19 NOTE — Progress Notes (Addendum)
Pt PCR screen +.  Pt notified to come to [facility] to pick up  Mupirocin ointment.

## 2013-09-20 MED ORDER — MUPIROCIN 2 % EX OINT: TOPICAL_OINTMENT | Freq: Two times a day (BID) | CUTANEOUS | Status: DC

## 2013-09-20 MED ORDER — MUPIROCIN 2 % EX OINT
TOPICAL_OINTMENT | CUTANEOUS | Status: AC
  Filled 2013-09-20: qty 22

## 2013-09-22 NOTE — H&P (Signed)
Christopher [name] is an 58 y.o. male.   Chief Complaint   Patient presents with   .  Follow-up       Recurring left shoulder pain, no new injury     HISTORY: 58 year old male status post arthroscopy of the right shoulder presents with pain in the left shoulder over the last few weeks associated without trauma but associated with difficulty raising his arm weakness in the rotator cuff pain over the left. Acromial region and inability to sleep on the left side  Review of Systems  Musculoskeletal: Positive for back pain and neck pain.  Neurological: Negative for tingling and sensory change.    General appearance is normal, the patient is alert and oriented x3 with normal mood and affect. Blood pressure 138/82, height 5\' 6"  (1.676 m), weight 222 lb (100.699 kg). Right shoulder sore tender but rom is normal strenght is normal stability tests were normal  Gait analysis shows unlabored gait  However, the patient has difficulties with his lower extremities and his back. He has chronic intermittent knee pain with swelling bilaterally currently has functional range of motion no instability normal strength with no skin abnormalities swelling or tenderness in the knees.   Left Shoulder Exam   Tenderness  The patient is experiencing tenderness in the acromion and biceps tendon.  Range of Motion  Active Abduction:  80 abnormal   Passive Abduction: abnormal   Extension: normal   Forward Flexion:  120 abnormal   External Rotation: normal   Muscle Strength  Abduction: 4/5   Internal Rotation: 5/5   External Rotation: 5/5   Supraspinatus: 4/5   Subscapularis: 5/5   Biceps: 5/5   Tests  Apprehension: negative Cross Arm: negative Drop Arm: negative Hawkin's test: positive Impingement: positive Sulcus: absent  Other  Erythema: absent Scars: absent Sensation: normal Pulse: present   Comments:  Reflexes are normal    Past Medical History  Diagnosis Date  . Hyperlipidemia   .  Anxiety   . Depression   . GERD (gastroesophageal reflux disease)   . Sleep apnea     uses CIPAP machine at night  . Heart murmur     Years ago- not now  . Headache(784.0)     after surgery  . Complication of anesthesia     pt had a hard time being able to move after spinal anesthesia , 3-4 hours  . PONV (postoperative nausea and vomiting)   . Arthritis   . Diabetes mellitus without complication     pt sts borderline    Past Surgical History  Procedure Laterality Date  . Knee arthroscopy      left knee  . Knee arthroscopy      right knee   . Spinal fusion      x 2  . Neck fusion    . Back surgery    . Colonoscopy  06/27/2011    Procedure: COLONOSCOPY;  Surgeon: Rogene Houston, MD;  Location: [facility] ENDO SUITE;  Service: Endoscopy;  Laterality: N/A;  9:00 / Pt to be here at 9am for 10:45 procedure, benign polyps removed  . Hernia repair      umbilical hernia  . Cardiac catheterization    . Chondroplasty  08/15/2011    Procedure: CHONDROPLASTY;  Surgeon: [name], MD;  Location: [facility] ORS;  Service: Orthopedics;  Laterality: Left;  . Lumbar laminectomy/decompression microdiscectomy  09/14/2012    Procedure: LUMBAR LAMINECTOMY/DECOMPRESSION MICRODISCECTOMY 1 LEVEL;  Surgeon: Floyce Stakes, MD;  Location: [facility]  NEURO ORS;  Service: Neurosurgery;  Laterality: Right;  Right Lumbar three-four Diskectomy  . Shoulder surgery Right     Open Mumford procedure  . Appendectomy      Family History  Problem Relation Age of Onset  . Diabetes    . Lung disease    . Arthritis    . Anesthesia problems Neg Hx   . Hypotension Neg Hx   . Malignant hyperthermia Neg Hx   . Pseudochol deficiency Neg Hx    Social History:  reports that he has been smoking Cigarettes.  He has a 10.25 pack-year smoking history. He does not have any smokeless tobacco history on file. He reports that he does not drink alcohol or use illicit drugs.  Allergies:  Allergies  Allergen Reactions  . Celebrex  [Celecoxib] Itching and Swelling    All over  . Codeine Nausea And Vomiting    Extreme stomach pain. This includes anything with the derivative of codeine in it.  . Doxycycline Swelling    Made tongue turn black   . Cortisone Swelling  . Oxycodone-Acetaminophen Itching    Can tolerate with benadryl   . Prednisone Swelling  . Relafen [Nabumetone] Swelling    No prescriptions prior to admission    No results found for this or any previous visit (from the past 48 hour(s)). No results found.  ROS  There were no vitals taken for this visit. Physical Exam   Assessment/Plan   IMPRESSION: 1. Glenohumeral degenerative changes with diffuse irregularity of the posterior superior labrum suspicious for degenerative tearing. 2. Tendinosis of the intra-articular portion of the biceps tendon. 3. Partial articular surface insertional tear of the subscapularis tendon. No full-thickness tendon tear or focal muscular atrophy. 4. Joint capsular edema suggesting possible adhesive capsulitis.     Preop diagnosis Glenohumeral arthritis Tendinitis biceps tendon Partial tear subscapularis Adhesive capsulitis left shoulder  Plan arthroscopy left shoulder manipulation eft shoulder and lysis of adhesions  [name] 09/22/2013, 2:50 PM

## 2013-09-23 ENCOUNTER — Encounter (HOSPITAL_COMMUNITY)
Admission: RE | Disposition: A | Discharge status: Home / Self Care | Payer: Self-pay | Source: Ambulatory Visit | Attending: Orthopedic Surgery

## 2013-09-23 ENCOUNTER — Ambulatory Visit (HOSPITAL_COMMUNITY): Payer: Medicare Other | Admitting: Anesthesiology

## 2013-09-23 ENCOUNTER — Encounter (HOSPITAL_COMMUNITY): Payer: Self-pay | Admitting: *Deleted

## 2013-09-23 ENCOUNTER — Ambulatory Visit (HOSPITAL_COMMUNITY)
Admission: RE | Admit: 2013-09-23 | Discharge: 2013-09-23 | Disposition: A | Discharge status: Home / Self Care | Payer: Medicare Other | Source: Ambulatory Visit | Attending: Orthopedic Surgery | Admitting: Orthopedic Surgery

## 2013-09-23 ENCOUNTER — Encounter (HOSPITAL_COMMUNITY): Payer: Medicare Other | Admitting: Anesthesiology

## 2013-09-23 DIAGNOSIS — M67919 Unspecified disorder of synovium and tendon, unspecified shoulder: Secondary | ICD-10-CM

## 2013-09-23 DIAGNOSIS — M719 Bursopathy, unspecified: Secondary | ICD-10-CM

## 2013-09-23 DIAGNOSIS — G4730 Sleep apnea, unspecified: Secondary | ICD-10-CM | POA: Insufficient documentation

## 2013-09-23 DIAGNOSIS — M659 Unspecified synovitis and tenosynovitis, unspecified site: Secondary | ICD-10-CM | POA: Insufficient documentation

## 2013-09-23 DIAGNOSIS — S46819A Strain of other muscles, fascia and tendons at shoulder and upper arm level, unspecified arm, initial encounter: Secondary | ICD-10-CM

## 2013-09-23 DIAGNOSIS — IMO0002 Reserved for concepts with insufficient information to code with codable children: Secondary | ICD-10-CM

## 2013-09-23 DIAGNOSIS — M1990 Unspecified osteoarthritis, unspecified site: Secondary | ICD-10-CM

## 2013-09-23 DIAGNOSIS — M24019 Loose body in unspecified shoulder: Secondary | ICD-10-CM | POA: Insufficient documentation

## 2013-09-23 DIAGNOSIS — E119 Type 2 diabetes mellitus without complications: Secondary | ICD-10-CM | POA: Insufficient documentation

## 2013-09-23 DIAGNOSIS — S43499A Other sprain of unspecified shoulder joint, initial encounter: Secondary | ICD-10-CM | POA: Insufficient documentation

## 2013-09-23 DIAGNOSIS — E785 Hyperlipidemia, unspecified: Secondary | ICD-10-CM | POA: Insufficient documentation

## 2013-09-23 DIAGNOSIS — Z01812 Encounter for preprocedural laboratory examination: Secondary | ICD-10-CM | POA: Insufficient documentation

## 2013-09-23 DIAGNOSIS — M7500 Adhesive capsulitis of unspecified shoulder: Secondary | ICD-10-CM | POA: Insufficient documentation

## 2013-09-23 HISTORY — PX: SHOULDER ARTHROSCOPY WITH BICEPSTENOTOMY: SHX6204

## 2013-09-23 LAB — GLUCOSE, CAPILLARY
Glucose-Capillary: 109 mg/dL — ABNORMAL HIGH (ref 70–99)
Glucose-Capillary: 132 mg/dL — ABNORMAL HIGH (ref 70–99)

## 2013-09-23 SURGERY — SHOULDER ARTHROSCOPY WITH BICEPS TENOTOMY
Anesthesia: General | Site: Shoulder | Laterality: Left

## 2013-09-23 MED ORDER — NEOSTIGMINE METHYLSULFATE 1 MG/ML IJ SOLN
INTRAMUSCULAR | Status: DC | PRN
  Administered 2013-09-23: 2 mg via INTRAVENOUS

## 2013-09-23 MED ORDER — KETOROLAC TROMETHAMINE 30 MG/ML IJ SOLN
30.0000 mg | Freq: Once | INTRAMUSCULAR | Status: AC
  Administered 2013-09-23: 30 mg via INTRAVENOUS
  Filled 2013-09-23: qty 1

## 2013-09-23 MED ORDER — SCOPOLAMINE 1 MG/3DAYS TD PT72
MEDICATED_PATCH | TRANSDERMAL | Status: AC
  Filled 2013-09-23: qty 1

## 2013-09-23 MED ORDER — HYDROMORPHONE HCL PF 1 MG/ML IJ SOLN
INTRAMUSCULAR | Status: AC
  Filled 2013-09-23: qty 1

## 2013-09-23 MED ORDER — HYDROMORPHONE HCL PF 1 MG/ML IJ SOLN
INTRAMUSCULAR | Status: AC
Start: 2013-09-23 — End: 2013-09-23
  Filled 2013-09-23: qty 1

## 2013-09-23 MED ORDER — HYDROMORPHONE BOLUS VIA INFUSION
0.5000 mg | INTRAVENOUS | Status: DC | PRN
  Administered 2013-09-23: 0.5 mg via INTRAVENOUS

## 2013-09-23 MED ORDER — FENTANYL CITRATE 0.05 MG/ML IJ SOLN
INTRAMUSCULAR | Status: AC
  Filled 2013-09-23: qty 2

## 2013-09-23 MED ORDER — GLYCOPYRROLATE 0.2 MG/ML IJ SOLN
INTRAMUSCULAR | Status: DC | PRN
  Administered 2013-09-23: 0.4 mg via INTRAVENOUS

## 2013-09-23 MED ORDER — ROCURONIUM BROMIDE 100 MG/10ML IV SOLN
INTRAVENOUS | Status: DC | PRN
  Administered 2013-09-23: 5 mg via INTRAVENOUS
  Administered 2013-09-23: 8 mg via INTRAVENOUS
  Administered 2013-09-23: 10 mg via INTRAVENOUS

## 2013-09-23 MED ORDER — OXYCODONE HCL 5 MG PO TABS
5.0000 mg | ORAL_TABLET | ORAL | Status: DC
  Administered 2013-09-23: 5 mg via ORAL

## 2013-09-23 MED ORDER — DIPHENHYDRAMINE HCL 25 MG PO CAPS
25.0000 mg | ORAL_CAPSULE | Freq: Once | ORAL | Status: AC
  Administered 2013-09-23: 25 mg via ORAL
  Filled 2013-09-23: qty 1

## 2013-09-23 MED ORDER — GLYCOPYRROLATE 0.2 MG/ML IJ SOLN
INTRAMUSCULAR | Status: AC
  Filled 2013-09-23: qty 2

## 2013-09-23 MED ORDER — HYDROMORPHONE HCL 4 MG PO TABS: 4.0000 mg | ORAL_TABLET | ORAL | Status: DC | PRN

## 2013-09-23 MED ORDER — BUPIVACAINE-EPINEPHRINE 0.5% -1:200000 IJ SOLN
INTRAMUSCULAR | Status: DC | PRN
  Administered 2013-09-23: 10 mL
  Administered 2013-09-23: 50 mL

## 2013-09-23 MED ORDER — FENTANYL CITRATE 0.05 MG/ML IJ SOLN
25.0000 ug | INTRAMUSCULAR | Status: DC | PRN
  Administered 2013-09-23 (×4): 50 ug via INTRAVENOUS

## 2013-09-23 MED ORDER — EPINEPHRINE HCL 1 MG/ML IJ SOLN
INTRAMUSCULAR | Status: AC
  Filled 2013-09-23: qty 3

## 2013-09-23 MED ORDER — EPINEPHRINE HCL 1 MG/ML IJ SOLN
INTRAMUSCULAR | Status: AC
  Filled 2013-09-23: qty 4

## 2013-09-23 MED ORDER — OXYCODONE-ACETAMINOPHEN 10-325 MG PO TABS: 1.0000 | ORAL_TABLET | ORAL | Status: DC | PRN

## 2013-09-23 MED ORDER — FENTANYL CITRATE 0.05 MG/ML IJ SOLN
INTRAMUSCULAR | Status: AC
  Filled 2013-09-23: qty 5

## 2013-09-23 MED ORDER — EPINEPHRINE HCL 1 MG/ML IJ SOLN
INTRAMUSCULAR | Status: DC | PRN
  Administered 2013-09-23 (×11)

## 2013-09-23 MED ORDER — BUPIVACAINE-EPINEPHRINE PF 0.5-1:200000 % IJ SOLN
INTRAMUSCULAR | Status: AC
  Filled 2013-09-23: qty 20

## 2013-09-23 MED ORDER — MIDAZOLAM HCL 2 MG/2ML IJ SOLN
1.0000 mg | INTRAMUSCULAR | Status: DC | PRN
  Administered 2013-09-23: 2 mg via INTRAVENOUS

## 2013-09-23 MED ORDER — SODIUM CHLORIDE 0.9 % IR SOLN
Status: DC | PRN
  Administered 2013-09-23: 1000 mL

## 2013-09-23 MED ORDER — METHOCARBAMOL 100 MG/ML IJ SOLN
500.0000 mg | Freq: Four times a day (QID) | INTRAVENOUS | Status: DC
  Administered 2013-09-23: 500 mg via INTRAVENOUS
  Filled 2013-09-23 (×12): qty 5

## 2013-09-23 MED ORDER — ONDANSETRON HCL 4 MG/2ML IJ SOLN
INTRAMUSCULAR | Status: AC
  Filled 2013-09-23: qty 2

## 2013-09-23 MED ORDER — CHLORHEXIDINE GLUCONATE 4 % EX LIQD: 60.0000 mL | Freq: Once | CUTANEOUS | Status: DC

## 2013-09-23 MED ORDER — PROPOFOL 10 MG/ML IV EMUL
INTRAVENOUS | Status: AC
  Filled 2013-09-23: qty 20

## 2013-09-23 MED ORDER — ONDANSETRON HCL 4 MG/2ML IJ SOLN
4.0000 mg | Freq: Once | INTRAMUSCULAR | Status: AC | PRN
  Administered 2013-09-23: 4 mg via INTRAVENOUS

## 2013-09-23 MED ORDER — OXYCODONE HCL 5 MG PO TABS
ORAL_TABLET | ORAL | Status: AC
  Filled 2013-09-23: qty 1

## 2013-09-23 MED ORDER — LIDOCAINE HCL (CARDIAC) 10 MG/ML IV SOLN
INTRAVENOUS | Status: DC | PRN
  Administered 2013-09-23: 10 mg via INTRAVENOUS

## 2013-09-23 MED ORDER — PROPOFOL 10 MG/ML IV BOLUS
INTRAVENOUS | Status: AC
  Filled 2013-09-23: qty 20

## 2013-09-23 MED ORDER — PROPOFOL 10 MG/ML IV BOLUS
INTRAVENOUS | Status: DC | PRN
  Administered 2013-09-23: 160 mg via INTRAVENOUS
  Administered 2013-09-23: 30 mg via INTRAVENOUS
  Administered 2013-09-23: 20 mg via INTRAVENOUS
  Administered 2013-09-23: 10 mg via INTRAVENOUS
  Administered 2013-09-23: 20 mg via INTRAVENOUS
  Administered 2013-09-23: 30 mg via INTRAVENOUS
  Administered 2013-09-23 (×2): 20 mg via INTRAVENOUS

## 2013-09-23 MED ORDER — SCOPOLAMINE 1 MG/3DAYS TD PT72
1.0000 | MEDICATED_PATCH | Freq: Once | TRANSDERMAL | Status: DC
  Administered 2013-09-23: 1.5 mg via TRANSDERMAL

## 2013-09-23 MED ORDER — HYDROMORPHONE HCL PF 1 MG/ML IJ SOLN
0.5000 mg | INTRAMUSCULAR | Status: DC | PRN
  Administered 2013-09-23 (×3): 0.5 mg via INTRAVENOUS

## 2013-09-23 MED ORDER — ROCURONIUM BROMIDE 50 MG/5ML IV SOLN
INTRAVENOUS | Status: AC
  Filled 2013-09-23: qty 1

## 2013-09-23 MED ORDER — GLYCOPYRROLATE 0.2 MG/ML IJ SOLN
INTRAMUSCULAR | Status: AC
  Filled 2013-09-23: qty 1

## 2013-09-23 MED ORDER — MIDAZOLAM HCL 2 MG/2ML IJ SOLN
INTRAMUSCULAR | Status: AC
  Filled 2013-09-23: qty 2

## 2013-09-23 MED ORDER — LIDOCAINE HCL (PF) 1 % IJ SOLN
INTRAMUSCULAR | Status: AC
  Filled 2013-09-23: qty 5

## 2013-09-23 MED ORDER — FENTANYL CITRATE 0.05 MG/ML IJ SOLN
INTRAMUSCULAR | Status: AC
Start: 2013-09-23 — End: 2013-09-23
  Filled 2013-09-23: qty 2

## 2013-09-23 MED ORDER — LACTATED RINGERS IV SOLN
INTRAVENOUS | Status: DC
  Administered 2013-09-23 (×2): via INTRAVENOUS

## 2013-09-23 MED ORDER — FENTANYL CITRATE 0.05 MG/ML IJ SOLN
INTRAMUSCULAR | Status: DC | PRN
  Administered 2013-09-23 (×2): 50 ug via INTRAVENOUS
  Administered 2013-09-23 (×2): 25 ug via INTRAVENOUS
  Administered 2013-09-23 (×2): 50 ug via INTRAVENOUS

## 2013-09-23 MED ORDER — SUCCINYLCHOLINE CHLORIDE 20 MG/ML IJ SOLN
INTRAMUSCULAR | Status: DC | PRN
  Administered 2013-09-23: 120 mg via INTRAVENOUS

## 2013-09-23 MED ORDER — CEFAZOLIN SODIUM-DEXTROSE 2-3 GM-% IV SOLR
INTRAVENOUS | Status: AC
  Filled 2013-09-23: qty 50

## 2013-09-23 MED ORDER — ONDANSETRON HCL 4 MG/2ML IJ SOLN
4.0000 mg | Freq: Four times a day (QID) | INTRAMUSCULAR | Status: DC
Start: 2013-09-23 — End: 2013-09-25

## 2013-09-23 MED ORDER — ONDANSETRON HCL 4 MG/2ML IJ SOLN
4.0000 mg | Freq: Once | INTRAMUSCULAR | Status: AC
  Administered 2013-09-23: 4 mg via INTRAVENOUS

## 2013-09-23 MED ORDER — CEFAZOLIN SODIUM-DEXTROSE 2-3 GM-% IV SOLR
2.0000 g | INTRAVENOUS | Status: AC
  Administered 2013-09-23: 2 g via INTRAVENOUS

## 2013-09-23 MED ORDER — DIPHENHYDRAMINE HCL 25 MG PO TABS: 25.0000 mg | ORAL_TABLET | Freq: Four times a day (QID) | ORAL | Status: DC | PRN

## 2013-09-23 MED ORDER — SUCCINYLCHOLINE CHLORIDE 20 MG/ML IJ SOLN
INTRAMUSCULAR | Status: AC
  Filled 2013-09-23: qty 1

## 2013-09-23 MED ORDER — GLYCOPYRROLATE 0.2 MG/ML IJ SOLN
0.2000 mg | Freq: Once | INTRAMUSCULAR | Status: AC
  Administered 2013-09-23: 0.2 mg via INTRAVENOUS

## 2013-09-23 SURGICAL SUPPLY — 83 items
BAG HAMPER (MISCELLANEOUS) ×4 IMPLANT
BLADE AGGRESSIVE PLUS 4.0 (BLADE) ×8 IMPLANT
BLADE AVERAGE 25MMX9MM (BLADE)
BLADE AVERAGE 25X9 (BLADE) IMPLANT
BLADE SURG SZ11 CARB STEEL (BLADE) ×4 IMPLANT
BNDG COHESIVE 4X5 TAN STRL (GAUZE/BANDAGES/DRESSINGS) ×4 IMPLANT
BUR 5.0 BARRELL (BURR) IMPLANT
BUR BARRELL 4.0 (BURR) IMPLANT
BUR ROUND 5.0 (BURR) IMPLANT
CANNULA DRILOCK 5.0MMX75MM (CANNULA) ×2
CANNULA DRILOCK 5.0X75 (CANNULA) ×6 IMPLANT
CANNULA DRILOCK 8.0MMX75MM (CANNULA) ×1
CANNULA DRILOCK 8.0X75 (CANNULA) ×3 IMPLANT
CHLORAPREP W/TINT 26ML (MISCELLANEOUS) ×4 IMPLANT
CLOTH BEACON ORANGE TIMEOUT ST (SAFETY) ×4 IMPLANT
COOLER CRYO CUFF IC AND MOTOR (MISCELLANEOUS) IMPLANT
COVER LIGHT HANDLE STERIS (MISCELLANEOUS) ×8 IMPLANT
COVER PROBE W GEL 5X96 (DRAPES) ×4 IMPLANT
CUFF CRYO UNI SHDR 32X48 (MISCELLANEOUS) IMPLANT
DECANTER SPIKE VIAL GLASS SM (MISCELLANEOUS) ×8 IMPLANT
DRAPE PROXIMA HALF (DRAPES) ×4 IMPLANT
DRAPE SHOULDER BEACH CHAIR (DRAPES) ×4 IMPLANT
DRAPE U-SHAPE 47X51 STRL (DRAPES) ×4 IMPLANT
DRESSING ALLEVYN BORDER HEEL (GAUZE/BANDAGES/DRESSINGS) ×8 IMPLANT
ELECT REM PT RETURN 9FT ADLT (ELECTROSURGICAL) ×4
ELECTRODE REM PT RTRN 9FT ADLT (ELECTROSURGICAL) ×2 IMPLANT
FACESHIELD STD STERILE (MASK) IMPLANT
FIBERSTICK 2 (SUTURE) IMPLANT
GAUZE SPONGE 4X4 16PLY XRAY LF (GAUZE/BANDAGES/DRESSINGS) ×4 IMPLANT
GLOVE BIOGEL PI IND STRL 7.0 (GLOVE) ×4 IMPLANT
GLOVE BIOGEL PI INDICATOR 7.0 (GLOVE) ×4
GLOVE SKINSENSE NS SZ8.0 LF (GLOVE) ×2
GLOVE SKINSENSE STRL SZ8.0 LF (GLOVE) ×2 IMPLANT
GLOVE SS BIOGEL STRL SZ 6.5 (GLOVE) ×4 IMPLANT
GLOVE SS N UNI LF 8.5 STRL (GLOVE) ×4 IMPLANT
GLOVE SUPERSENSE BIOGEL SZ 6.5 (GLOVE) ×4
GOWN STRL REUS W/TWL LRG LVL3 (GOWN DISPOSABLE) ×16 IMPLANT
GOWN STRL REUS W/TWL XL LVL3 (GOWN DISPOSABLE) ×4 IMPLANT
INST SET MINOR BONE (KITS) ×4 IMPLANT
IV NS IRRIG 3000ML ARTHROMATIC (IV SOLUTION) ×48 IMPLANT
KIT BLADEGUARD II DBL (SET/KITS/TRAYS/PACK) ×4 IMPLANT
KIT POSITION SHOULDER SCHLEI (MISCELLANEOUS) ×4 IMPLANT
KIT ROOM TURNOVER APOR (KITS) ×4 IMPLANT
MANIFOLD NEPTUNE II (INSTRUMENTS) ×4 IMPLANT
MARKER SKIN DUAL TIP RULER LAB (MISCELLANEOUS) ×4 IMPLANT
NEEDLE HYPO 21X1.5 SAFETY (NEEDLE) ×4 IMPLANT
NEEDLE MAYO 6 CRC TAPER PT (NEEDLE) ×4 IMPLANT
NEEDLE SCORPION (NEEDLE) IMPLANT
NEEDLE SPNL 18GX3.5 QUINCKE PK (NEEDLE) ×4 IMPLANT
NS IRRIG 1000ML POUR BTL (IV SOLUTION) ×4 IMPLANT
PACK BASIC III (CUSTOM PROCEDURE TRAY) ×2
PACK SRG BSC III STRL LF ECLPS (CUSTOM PROCEDURE TRAY) ×2 IMPLANT
PAD ARMBOARD 7.5X6 YLW CONV (MISCELLANEOUS) ×4 IMPLANT
PENCIL HANDSWITCHING (ELECTRODE) ×4 IMPLANT
RASP SM TEAR CROSS CUT (RASP) IMPLANT
SET ARTHROSCOPY INST (INSTRUMENTS) ×4 IMPLANT
SET BASIN LINEN APH (SET/KITS/TRAYS/PACK) ×4 IMPLANT
SLING ARM FOAM STRAP LRG (SOFTGOODS) ×4 IMPLANT
STOCKINETTE IMPERVIOUS LG (DRAPES) ×4 IMPLANT
SUT BONE WAX W31G (SUTURE) IMPLANT
SUT ETHIBOND NAB OS 4 #2 30IN (SUTURE) IMPLANT
SUT ETHILON 3 0 FSL (SUTURE) ×4 IMPLANT
SUT FIBERWIRE #2 38 REV NDL BL (SUTURE)
SUT FIBERWIRE #2 38 T-5 BLUE (SUTURE)
SUT LASSO 45 DEGREE (SUTURE) IMPLANT
SUT LASSO 45 DEGREE LEFT (SUTURE) IMPLANT
SUT LASSO 45D RIGHT (SUTURE) IMPLANT
SUT MNCRL 0 VIOLET CTX 36 (SUTURE) IMPLANT
SUT MON AB 2-0 CT1 36 (SUTURE) IMPLANT
SUT MONOCRYL 0 CTX 36 (SUTURE)
SUT PROLENE 2 0 FS (SUTURE) IMPLANT
SUT PROLENE 2 0 SH 30 (SUTURE) IMPLANT
SUT PROLENE 3 0 PS 1 (SUTURE) IMPLANT
SUT VIC AB 1 CT1 27 (SUTURE)
SUT VIC AB 1 CT1 27XBRD ANTBC (SUTURE) IMPLANT
SUTURE FIBERWR #2 38 T-5 BLUE (SUTURE) IMPLANT
SUTURE FIBERWR#2 38 REV NDL BL (SUTURE) IMPLANT
SYR 30ML LL (SYRINGE) ×4 IMPLANT
SYR BULB IRRIGATION 50ML (SYRINGE) IMPLANT
TAPE MEDIFIX FOAM 3 (GAUZE/BANDAGES/DRESSINGS) IMPLANT
TUBING ARTHROSCOPY INFLOW/OUT (IRRIGATION / IRRIGATOR) ×4 IMPLANT
WAND 90 DEG TURBOVAC W/CORD (SURGICAL WAND) ×4 IMPLANT
YANKAUER SUCT BULB TIP 10FT TU (MISCELLANEOUS) ×8 IMPLANT

## 2013-09-23 NOTE — Progress Notes (Signed)
Awake. States pain is decreasing. Dressed. Sling change to size large. Coffee given to drink. Tolerated well. Transferred to SDS in good condition.

## 2013-09-23 NOTE — Interval H&P Note (Signed)
History and Physical Interval Note:  09/23/2013 12:39 PM  Christopher [name]  has presented today for surgery, with the diagnosis of ADHESIVE CAPSULITIS LEFT SHOULDER  The various methods of treatment have been discussed with the patient and family. After consideration of risks, benefits and other options for treatment, the patient has consented to  Procedure(s) with comments: ARTHROSCOPY SHOULDER (Left) - With lysis of Adhesions EXAM UNDER ANESTHESIA WITH MANIPULATION OF SHOULDER (Left) LYSIS OF ADHESION (Left) as a surgical intervention .  The patient's history has been reviewed, patient examined, no change in status, stable for surgery.  I have reviewed the patient's chart and labs.  Questions were answered to the patient's satisfaction.     [name]

## 2013-09-23 NOTE — Transfer of Care (Signed)
Immediate Anesthesia Transfer of Care Note  Patient: Christopher [name]  Procedure(s) Performed: Procedure(s) (LRB): ARTHROSCOPY SHOULDER (Left) EXAM UNDER ANESTHESIA WITH MANIPULATION OF SHOULDER (Left)  Patient Location: PACU  Anesthesia Type: General  Level of Consciousness: awake  Airway & Oxygen Therapy: Patient Spontanous Breathing and non-rebreather face mask  Post-op Assessment: Report given to PACU RN, Post -op Vital signs reviewed and stable and Patient moving all extremities  Post vital signs: Reviewed and stable  Complications: No apparent anesthesia complications

## 2013-09-23 NOTE — OR Nursing (Signed)
Up to bathroom to void 

## 2013-09-23 NOTE — Brief Op Note (Addendum)
09/23/2013  3:10 PM  PATIENT:  Christopher [name]  58 y.o. male  PRE-OPERATIVE DIAGNOSIS:  Adhesive capsulitis left shoulder, synovitis left shoulder, the humeral arthritis left shoulder, biceps tendon tear, subscapularis tear. POST-OPERATIVE DIAGNOSIS: Adhesive capsulitis left shoulder synovitis left shoulder glenohumeral arthritis left shoulder biceps tendon tear Loose body   PROCEDURE: Arthroscopy left shoulder, biceps tenotomy, extensive debridement, loose body removal   Operative findings   Inflamed synovial tissue,   glenohumeral arthritis.   Degenerative biceps tendon tear.   Loose body.   Bursitis.  SURGEON:  Surgeon(s) and Role:     * Carole Civil, MD - Primary  PHYSICIAN ASSISTANT:   ASSISTANTS: Corrie Dandy   ANESTHESIA:   general  EBL:  Total I/O In: 900 [I.V.:900] Out: 10 [Blood:10]  BLOOD ADMINISTERED:none  DRAINS: None LOCAL MEDICATIONS USED:  MARCAINE    and Amount: 60 ml  SPECIMEN:  No Specimen  DISPOSITION OF SPECIMEN:  N/A  COUNTS:  YES  TOURNIQUET:  * No tourniquets in log *  DICTATION: .Dragon Dictation  PLAN OF CARE: Discharge to home after PACU  PATIENT DISPOSITION:  PACU - hemodynamically stable.   Delay start of Pharmacological VTE agent (>24hrs) due to surgical blood loss or risk of bleeding: not applicable  Details of procedure Mr. Arwood was evaluated in the preoperative holding area and the surgical site left shoulder was confirmed and marked as such. A chart review was completed. The patient was taken to the operating room and given general anesthesia. He was placed in the beachchair position with appropriate padding.  His left arm was prepped and draped sterilely.  After draping timeout was completed.  A posterior portal was established in the standard position the scope was placed into the joint. A diagnostic arthroscopy was performed evaluating the joint in circumferential manner. An anterior portal was established  above the subscapularis using a spinal needle. A shaver was placed in the joint and a debridement was performed debriding the synovial tissue which was inflamed, the posterior labral tear, and the torn tissue at the superior leading edge of the subscapularis.   The biceps tendon was degenerated and had multiple longitudinal tears. This was resected from the glenoid tubercle and debrided back to the level of the rotator interval.  The glenoid had moderate degenerative changes without full-thickness cartilage loss. A loose body was found at the inferior margin of the anterior labrum. The anterior labrum was also degenerated and debrided.  The humeral head looks relatively good. The rotator cuff was intact.  The scope was placed in the subacromial space 2 additional portals were placed using a spinal needle and a bursectomy was performed starting at the front of theworking posteriorly. The rotator cuff was evaluated and found to be intact on the superior surface. The patient had a type I acromion and did not need bone resection.  The joint was irrigated including the subacromial space and the 4 portals created were closed with 3-0 nylon sutures

## 2013-09-23 NOTE — Progress Notes (Signed)
Awake. Drinking coffee. Wants to go home. States pain is tolerable. Wife at side. Instructed pt to use CPAP at home when taking pain meds. Voiced understanding.

## 2013-09-23 NOTE — Interval H&P Note (Signed)
History and Physical Interval Note:  09/23/2013 12:43 PM  Christopher [name]  has presented today for surgery, with the diagnosis of ADHESIVE CAPSULITIS LEFT SHOULDER  The various methods of treatment have been discussed with the patient and family. After consideration of risks, benefits and other options for treatment, the patient has consented to  Procedure(s) with comments: ARTHROSCOPY SHOULDER (Left) - With lysis of Adhesions EXAM UNDER ANESTHESIA WITH MANIPULATION OF SHOULDER (Left) LYSIS OF ADHESION (Left) as a surgical intervention .  The patient's history has been reviewed, patient examined, no change in status, stable for surgery.  I have reviewed the patient's chart and labs.  Questions were answered to the patient's satisfaction.     [name]

## 2013-09-23 NOTE — Anesthesia Postprocedure Evaluation (Signed)
  Anesthesia Post-op Note  Patient: Christopher [name]  Procedure(s) Performed: Procedure(s): ARTHROSCOPY SHOULDER (Left) EXAM UNDER ANESTHESIA WITH MANIPULATION OF SHOULDER (Left)  Patient Location: PACU  Anesthesia Type:General  Level of Consciousness: awake, alert  and patient cooperative  Airway and Oxygen Therapy: Patient Spontanous Breathing and non-rebreather face mask  Post-op Pain: moderate  Post-op Assessment: Post-op Vital signs reviewed, Patient's Cardiovascular Status Stable, Respiratory Function Stable, Patent Airway and No signs of Nausea or vomiting  Post-op Vital Signs: Reviewed and stable  Complications: No apparent anesthesia complications

## 2013-09-23 NOTE — Anesthesia Procedure Notes (Signed)
Procedure Name: Intubation Date/Time: 09/23/2013 1:17 PM Performed by: Vista Deck Pre-anesthesia Checklist: Patient identified, Timeout performed, Emergency Drugs available, Suction available and Patient being monitored Patient Re-evaluated:Patient Re-evaluated prior to inductionOxygen Delivery Method: Circle system utilized Preoxygenation: Pre-oxygenation with 100% oxygen Intubation Type: IV induction, Rapid sequence and Cricoid Pressure applied Grade View: Grade I Tube type: Oral Number of attempts: 1 Airway Equipment and Method: Stylet and Video-laryngoscopy Placement Confirmation: ETT inserted through vocal cords under direct vision,  breath sounds checked- equal and bilateral and positive ETCO2 Secured at: 22 cm Tube secured with: Tape Dental Injury: Teeth and Oropharynx as per pre-operative assessment

## 2013-09-23 NOTE — Anesthesia Preprocedure Evaluation (Addendum)
Anesthesia Evaluation  Patient identified by MRN, date of birth, ID band Patient awake    Reviewed: Allergy & Precautions, H&P , NPO status , Patient's Chart, lab work & pertinent test results  History of Anesthesia Complications (+) PONV and history of anesthetic complications  Airway Mallampati: III TM Distance: >3 FB Neck ROM: Full    Dental  (+) Edentulous Upper and Partial Lower   Pulmonary sleep apnea and Continuous Positive Airway Pressure Ventilation , Current Smoker,  breath sounds clear to auscultation        Cardiovascular negative cardio ROS  Rhythm:Regular Rate:Normal     Neuro/Psych  Headaches, PSYCHIATRIC DISORDERS Anxiety Depression  Neuromuscular disease    GI/Hepatic GERD-  Medicated and Controlled,  Endo/Other  diabetes (borderline)  Renal/GU      Musculoskeletal   Abdominal   Peds  Hematology   Anesthesia Other Findings   Reproductive/Obstetrics                          Anesthesia Physical Anesthesia Plan  ASA: III  Anesthesia Plan: General   Post-op Pain Management:    Induction: Intravenous, Rapid sequence and Cricoid pressure planned  Airway Management Planned: Oral ETT  Additional Equipment:   Intra-op Plan:   Post-operative Plan: Extubation in OR  Informed Consent: I have reviewed the patients History and Physical, chart, labs and discussed the procedure including the risks, benefits and alternatives for the proposed anesthesia with the patient or authorized representative who has indicated his/her understanding and acceptance.     Plan Discussed with:   Anesthesia Plan Comments:         Anesthesia Quick Evaluation

## 2013-09-24 NOTE — Op Note (Signed)
09/23/2013  3:10 PM  PATIENT:  Christopher [name]  58 y.o. male  PRE-OPERATIVE DIAGNOSIS:  Adhesive capsulitis left shoulder, synovitis left shoulder, the humeral arthritis left shoulder, biceps tendon tear, subscapularis tear. POST-OPERATIVE DIAGNOSIS: Adhesive capsulitis left shoulder synovitis left shoulder glenohumeral arthritis left shoulder biceps tendon tear Loose body   PROCEDURE: Arthroscopy left shoulder, biceps tenotomy, extensive debridement, loose body removal   Operative findings   Inflamed synovial tissue,   glenohumeral arthritis.   Degenerative biceps tendon tear.   Loose body.   Bursitis.  SURGEON:  Surgeon(s) and Role:     * Everitt Wenner E Rmoni Keplinger, MD - Primary  PHYSICIAN ASSISTANT:   ASSISTANTS: Debbie Dallas   ANESTHESIA:   general  EBL:  Total I/O In: 900 [I.V.:900] Out: 10 [Blood:10]  BLOOD ADMINISTERED:none  DRAINS: None LOCAL MEDICATIONS USED:  MARCAINE    and Amount: 60 ml  SPECIMEN:  No Specimen  DISPOSITION OF SPECIMEN:  N/A  COUNTS:  YES  TOURNIQUET:  * No tourniquets in log *  DICTATION: .Dragon Dictation  PLAN OF CARE: Discharge to home after PACU  PATIENT DISPOSITION:  PACU - hemodynamically stable.   Delay start of Pharmacological VTE agent (>24hrs) due to surgical blood loss or risk of bleeding: not applicable  Details of procedure Christopher [name] was evaluated in the preoperative holding area and the surgical site left shoulder was confirmed and marked as such. A chart review was completed. The patient was taken to the operating room and given general anesthesia. He was placed in the beachchair position with appropriate padding.  His left arm was prepped and draped sterilely.  After draping timeout was completed.  A posterior portal was established in the standard position the scope was placed into the joint. A diagnostic arthroscopy was performed evaluating the joint in circumferential manner. An anterior portal was established  above the subscapularis using a spinal needle. A shaver was placed in the joint and a debridement was performed debriding the synovial tissue which was inflamed, the posterior labral tear, and the torn tissue at the superior leading edge of the subscapularis.   The biceps tendon was degenerated and had multiple longitudinal tears. This was resected from the glenoid tubercle and debrided back to the level of the rotator interval.  The glenoid had moderate degenerative changes without full-thickness cartilage loss. A loose body was found at the inferior margin of the anterior labrum. The anterior labrum was also degenerated and debrided.  The humeral head looks relatively good. The rotator cuff was intact.  The scope was placed in the subacromial space 2 additional portals were placed using a spinal needle and a bursectomy was performed starting at the front of theworking posteriorly. The rotator cuff was evaluated and found to be intact on the superior surface. The patient had a type I acromion and did not need bone resection.  The joint was irrigated including the subacromial space and the 4 portals created were closed with 3-0 nylon sutures 

## 2013-09-26 ENCOUNTER — Ambulatory Visit (INDEPENDENT_AMBULATORY_CARE_PROVIDER_SITE_OTHER): Payer: Self-pay | Admitting: Orthopedic Surgery

## 2013-09-26 ENCOUNTER — Encounter (HOSPITAL_COMMUNITY): Payer: Self-pay | Admitting: Orthopedic Surgery

## 2013-09-26 VITALS — BP 140/86 | Ht 66.0 in | Wt 222.0 lb

## 2013-09-26 DIAGNOSIS — M65819 Other synovitis and tenosynovitis, unspecified shoulder: Secondary | ICD-10-CM | POA: Insufficient documentation

## 2013-09-26 DIAGNOSIS — S46819A Strain of other muscles, fascia and tendons at shoulder and upper arm level, unspecified arm, initial encounter: Secondary | ICD-10-CM

## 2013-09-26 DIAGNOSIS — M7550 Bursitis of unspecified shoulder: Secondary | ICD-10-CM | POA: Insufficient documentation

## 2013-09-26 DIAGNOSIS — M719 Bursopathy, unspecified: Secondary | ICD-10-CM

## 2013-09-26 DIAGNOSIS — M24119 Other articular cartilage disorders, unspecified shoulder: Secondary | ICD-10-CM

## 2013-09-26 DIAGNOSIS — Z9889 Other specified postprocedural states: Secondary | ICD-10-CM | POA: Insufficient documentation

## 2013-09-26 DIAGNOSIS — M67919 Unspecified disorder of synovium and tendon, unspecified shoulder: Secondary | ICD-10-CM

## 2013-09-26 DIAGNOSIS — M19019 Primary osteoarthritis, unspecified shoulder: Secondary | ICD-10-CM | POA: Insufficient documentation

## 2013-09-26 DIAGNOSIS — S46219A Strain of muscle, fascia and tendon of other parts of biceps, unspecified arm, initial encounter: Secondary | ICD-10-CM | POA: Insufficient documentation

## 2013-09-26 DIAGNOSIS — S43499A Other sprain of unspecified shoulder joint, initial encounter: Secondary | ICD-10-CM

## 2013-09-26 DIAGNOSIS — S43439A Superior glenoid labrum lesion of unspecified shoulder, initial encounter: Secondary | ICD-10-CM

## 2013-09-26 MED ORDER — OXYCODONE-ACETAMINOPHEN 10-325 MG PO TABS: 1.0000 | ORAL_TABLET | ORAL | Status: DC | PRN

## 2013-09-26 MED ORDER — HYDROMORPHONE HCL 4 MG PO TABS: 4.0000 mg | ORAL_TABLET | ORAL | Status: DC | PRN

## 2013-09-26 NOTE — Progress Notes (Signed)
Patient ID: Christopher [name], male   DOB: Mar 24, 1956, 58 y.o.   MRN: [id number] Chief Complaint  Patient presents with  . Follow-up    Post op 1 left shoulder arthrocsopy DOS 09/23/13    HISTORY:  PRE-OPERATIVE DIAGNOSIS:  Adhesive capsulitis left shoulder, synovitis left shoulder, the humeral arthritis left shoulder, biceps tendon tear, subscapularis tear. POST-OPERATIVE DIAGNOSIS: Adhesive capsulitis left shoulder synovitis left shoulder glenohumeral arthritis left shoulder biceps tendon tear Loose body    PROCEDURE: Arthroscopy left shoulder, biceps tenotomy, extensive debridement, loose body removal   Operative findings    Inflamed synovial tissue,   glenohumeral arthritis.   Degenerative biceps tendon tear.   Loose body.   Bursitis.  Preop MRI   IMPRESSION: 1. Glenohumeral degenerative changes with diffuse irregularity of the posterior superior labrum suspicious for degenerative tearing. 2. Tendinosis of the intra-articular portion of the biceps tendon. 3. Partial articular surface insertional tear of the subscapularis tendon. No full-thickness tendon tear or focal muscular atrophy. 4. Joint capsular edema suggesting possible adhesive capsulitis.  The patient is doing well he has good pain control with nylon and Percocet he is having some rash reaction controlled by Benadryl  Recommend continue current medication remove sling 3 times a day straighten arm cannot lift anything heavy come back in a week for followup

## 2013-10-03 ENCOUNTER — Encounter: Payer: Self-pay | Admitting: Orthopedic Surgery

## 2013-10-03 ENCOUNTER — Ambulatory Visit (INDEPENDENT_AMBULATORY_CARE_PROVIDER_SITE_OTHER): Payer: Self-pay | Admitting: Orthopedic Surgery

## 2013-10-03 VITALS — BP 140/93 | Ht 66.0 in | Wt 222.0 lb

## 2013-10-03 DIAGNOSIS — M7550 Bursitis of unspecified shoulder: Secondary | ICD-10-CM

## 2013-10-03 DIAGNOSIS — M719 Bursopathy, unspecified: Secondary | ICD-10-CM

## 2013-10-03 DIAGNOSIS — S46219A Strain of muscle, fascia and tendon of other parts of biceps, unspecified arm, initial encounter: Secondary | ICD-10-CM

## 2013-10-03 DIAGNOSIS — S46819A Strain of other muscles, fascia and tendons at shoulder and upper arm level, unspecified arm, initial encounter: Secondary | ICD-10-CM

## 2013-10-03 DIAGNOSIS — M67919 Unspecified disorder of synovium and tendon, unspecified shoulder: Secondary | ICD-10-CM

## 2013-10-03 DIAGNOSIS — Z9889 Other specified postprocedural states: Secondary | ICD-10-CM

## 2013-10-03 DIAGNOSIS — M24119 Other articular cartilage disorders, unspecified shoulder: Secondary | ICD-10-CM

## 2013-10-03 DIAGNOSIS — M65919 Unspecified synovitis and tenosynovitis, unspecified shoulder: Secondary | ICD-10-CM

## 2013-10-03 DIAGNOSIS — M65819 Other synovitis and tenosynovitis, unspecified shoulder: Secondary | ICD-10-CM

## 2013-10-03 DIAGNOSIS — S43499A Other sprain of unspecified shoulder joint, initial encounter: Secondary | ICD-10-CM

## 2013-10-03 DIAGNOSIS — M19019 Primary osteoarthritis, unspecified shoulder: Secondary | ICD-10-CM

## 2013-10-03 DIAGNOSIS — S43439A Superior glenoid labrum lesion of unspecified shoulder, initial encounter: Secondary | ICD-10-CM

## 2013-10-03 NOTE — Patient Instructions (Signed)
Gentle range of motion exercises °

## 2013-10-03 NOTE — Progress Notes (Signed)
Patient ID: Christopher [name], male   DOB: 05/28/1956, 58 y.o.   MRN: [id number] Chief Complaint  Patient presents with  . Follow-up    Post op 2 left shoulder arthroscopy DOS 09/23/2013    Encounter Diagnoses  Name Primary?  . S/P shoulder surgery Yes  . Arthritis, shoulder region   . Bursitis, shoulder   . Synovitis of shoulder   . Labral tear of shoulder, degenerative   . Biceps tendon tear    Chief Complaint   Patient presents with   .  Follow-up       Post op 1 left shoulder arthrocsopy DOS 09/23/13     HISTORY:  PRE-OPERATIVE DIAGNOSIS:  Adhesive capsulitis left shoulder, synovitis left shoulder, the humeral arthritis left shoulder, biceps tendon tear, subscapularis tear. POST-OPERATIVE DIAGNOSIS: Adhesive capsulitis left shoulder synovitis left shoulder glenohumeral arthritis left shoulder biceps tendon tear Loose body    PROCEDURE: Arthroscopy left shoulder, biceps tenotomy, extensive debridement, loose body removal   Operative findings    Inflamed synovial tissue,   glenohumeral arthritis.   Degenerative biceps tendon tear.   Loose body.   Bursitis.  Doing well his wounds are clean   Start Codman exercises   Return in 3 weeks

## 2013-10-24 ENCOUNTER — Ambulatory Visit (INDEPENDENT_AMBULATORY_CARE_PROVIDER_SITE_OTHER): Payer: Self-pay | Admitting: Orthopedic Surgery

## 2013-10-24 VITALS — BP 144/83 | Ht 66.0 in | Wt 222.0 lb

## 2013-10-24 DIAGNOSIS — S43439A Superior glenoid labrum lesion of unspecified shoulder, initial encounter: Secondary | ICD-10-CM

## 2013-10-24 DIAGNOSIS — M67919 Unspecified disorder of synovium and tendon, unspecified shoulder: Secondary | ICD-10-CM

## 2013-10-24 DIAGNOSIS — Z9889 Other specified postprocedural states: Secondary | ICD-10-CM

## 2013-10-24 DIAGNOSIS — M65819 Other synovitis and tenosynovitis, unspecified shoulder: Secondary | ICD-10-CM

## 2013-10-24 DIAGNOSIS — M24119 Other articular cartilage disorders, unspecified shoulder: Secondary | ICD-10-CM

## 2013-10-24 DIAGNOSIS — M7550 Bursitis of unspecified shoulder: Secondary | ICD-10-CM

## 2013-10-24 DIAGNOSIS — S46819A Strain of other muscles, fascia and tendons at shoulder and upper arm level, unspecified arm, initial encounter: Secondary | ICD-10-CM

## 2013-10-24 DIAGNOSIS — M19019 Primary osteoarthritis, unspecified shoulder: Secondary | ICD-10-CM

## 2013-10-24 DIAGNOSIS — S43499A Other sprain of unspecified shoulder joint, initial encounter: Secondary | ICD-10-CM

## 2013-10-24 DIAGNOSIS — S46219A Strain of muscle, fascia and tendon of other parts of biceps, unspecified arm, initial encounter: Secondary | ICD-10-CM

## 2013-10-24 DIAGNOSIS — M719 Bursopathy, unspecified: Secondary | ICD-10-CM

## 2013-10-24 NOTE — Patient Instructions (Signed)
Do gentle exercises with bands

## 2013-10-24 NOTE — Progress Notes (Signed)
Patient ID: Christopher [name], male   DOB: [birth year], 58 y.o.   MRN: [id number]  Chief Complaint  Patient presents with  . Follow-up    3 week recheck on left shoulder post op #3, DOS 09-23-13.    Mr. Steeves continues to make excellent progress with his left shoulder he is now 31 days post op from shoulder arthroscopy. We would like him to start some therapy and exercises and followup in a month

## 2013-11-21 ENCOUNTER — Ambulatory Visit (INDEPENDENT_AMBULATORY_CARE_PROVIDER_SITE_OTHER): Payer: Self-pay | Admitting: Orthopedic Surgery

## 2013-11-21 ENCOUNTER — Encounter: Payer: Self-pay | Admitting: Orthopedic Surgery

## 2013-11-21 VITALS — BP 129/77 | Ht 66.0 in | Wt 222.0 lb

## 2013-11-21 DIAGNOSIS — M24119 Other articular cartilage disorders, unspecified shoulder: Secondary | ICD-10-CM

## 2013-11-21 DIAGNOSIS — Z9889 Other specified postprocedural states: Secondary | ICD-10-CM

## 2013-11-21 DIAGNOSIS — S43439A Superior glenoid labrum lesion of unspecified shoulder, initial encounter: Secondary | ICD-10-CM

## 2013-11-21 NOTE — Progress Notes (Signed)
Patient ID: Christopher [name], male   DOB: [birth year], 58 y.o.   MRN: [id number]  Chief Complaint  Patient presents with  . Follow-up    one month recheck s/p left shoulder arthroscopy DOS 09/23/13    Ht 5\' 6"  (1.676 m)  Wt 222 lb (100.699 kg)  BMI 35.85 kg/m2  8 weeks and 3 days post op   Doing well, forward elevation is 130   Increase exercises and activity as tolerated   F/u prn

## 2013-11-21 NOTE — Patient Instructions (Signed)
activities as tolerated 

## 2014-04-03 ENCOUNTER — Ambulatory Visit (INDEPENDENT_AMBULATORY_CARE_PROVIDER_SITE_OTHER): Payer: Medicare Other | Admitting: Orthopedic Surgery

## 2014-04-03 VITALS — BP 132/76 | Ht 66.0 in | Wt 222.0 lb

## 2014-04-03 DIAGNOSIS — M719 Bursopathy, unspecified: Secondary | ICD-10-CM

## 2014-04-03 DIAGNOSIS — Z9889 Other specified postprocedural states: Secondary | ICD-10-CM

## 2014-04-03 DIAGNOSIS — M67919 Unspecified disorder of synovium and tendon, unspecified shoulder: Secondary | ICD-10-CM

## 2014-04-03 DIAGNOSIS — M7552 Bursitis of left shoulder: Secondary | ICD-10-CM

## 2014-04-03 DIAGNOSIS — M75102 Unspecified rotator cuff tear or rupture of left shoulder, not specified as traumatic: Secondary | ICD-10-CM

## 2014-04-03 MED ORDER — HYDROCODONE-ACETAMINOPHEN 7.5-325 MG PO TABS: 1.0000 | ORAL_TABLET | ORAL | Status: DC | PRN

## 2014-04-03 NOTE — Progress Notes (Signed)
Patient ID: Christopher [name], male   DOB: Feb 08, 1956, 58 y.o.   MRN: [id number] New problem   58 years old male status post arthroscopy left shoulder labral tear was doing well until he reached up as he was falling felt something tear in his left shoulder complains of pain and loss of motion some stiffness and nocturnal symptoms. He says he can work during the day but at night it hurts a lot more.  No catching or locking  No numbness  Remaining review of systems normal  Past Medical History  Diagnosis Date  . Hyperlipidemia   . Anxiety   . Depression   . GERD (gastroesophageal reflux disease)   . Sleep apnea     uses CIPAP machine at night  . Heart murmur     Years ago- not now  . Headache(784.0)     after surgery  . Complication of anesthesia     pt had a hard time being able to move after spinal anesthesia , 3-4 hours  . PONV (postoperative nausea and vomiting)   . Arthritis   . Diabetes mellitus without complication     pt sts borderline    BP 132/76  Ht 5\' 6"  (1.676 m)  Wt 222 lb (100.699 kg)  BMI 35.85 kg/m2 General appearance is normal, the patient is alert and oriented x3 with normal mood and affect. No specific tenderness but his active range of motion is limited to 100 of forward elevation 80 of abduction. His past range of motion is 150 of elevation and 90 of abduction. This is painful with positive impingement. Shoulder is stable his decreased internal rotation external rotation is normal strength in her rotator cuff is noted for mild weakness. Scans intact pulses are good sensation is normal lymph nodes are negative  Impression Encounter Diagnoses  Name Primary?  . S/P shoulder surgery Yes  . Bursitis, shoulder, left   . Rotator cuff syndrome of left shoulder    Procedure inject subacromial space left shoulder Diagnosis rotator cuff syndrome left shoulder Medication Depo-Medrol 40 mg, 1 cc and lidocaine 1% 3 cc Verbal consent Timeout completed  The  injection site was cleaned with alcohol and sprayed with ethyl chloride. From a posterior approach a 20-gauge needle was injected in the subacromial space. The medication went in easily. There were no complications. The wound was covered with a sterile bandage. Appropriate precautions were given.   Come back one week if no improvement MRI of the shoulder  Meds ordered this encounter  Medications  . HYDROcodone-acetaminophen (NORCO) 7.5-325 MG per tablet    Sig: Take 1 tablet by mouth every 4 (four) hours as needed for moderate pain.    Dispense:  120 tablet    Refill:  0

## 2014-04-03 NOTE — Patient Instructions (Signed)
You have received a steroid shot. 15% of patients experience increased pain at the injection site with in the next 24 hours. This is best treated with ice and tylenol extra strength 2 tabs every 8 hours. If you are still having pain please call the office.    

## 2014-04-11 ENCOUNTER — Ambulatory Visit (INDEPENDENT_AMBULATORY_CARE_PROVIDER_SITE_OTHER): Payer: Medicare Other | Admitting: Orthopedic Surgery

## 2014-04-11 VITALS — BP 154/47 | Ht 66.0 in | Wt 222.0 lb

## 2014-04-11 DIAGNOSIS — S43429A Sprain of unspecified rotator cuff capsule, initial encounter: Secondary | ICD-10-CM

## 2014-04-11 DIAGNOSIS — M75102 Unspecified rotator cuff tear or rupture of left shoulder, not specified as traumatic: Secondary | ICD-10-CM

## 2014-04-11 NOTE — Patient Instructions (Signed)
We will schedule MRI for you

## 2014-04-11 NOTE — Progress Notes (Signed)
58 years old male status post arthroscopy left shoulder labral tear was doing well until he reached up as he was falling felt something tear in his left shoulder complains of pain and loss of motion some stiffness and nocturnal symptoms. He says he can work during the day but at night it hurts a lot more.  I really suspect his report his rotator cuff or posterior labrum. I did give him an injection a try to take some pain medication he cannot take anti-inflammatories because of history of bleeding from NSAIDs so at this point is persistent pain and weakness I have told him he needs to have a new MRI done. So we will schedule him for a new MRI. Take the pain medication as previously prescribed.

## 2014-04-23 ENCOUNTER — Ambulatory Visit
Admission: RE | Admit: 2014-04-23 | Discharge: 2014-04-23 | Disposition: A | Discharge status: Home / Self Care | Payer: Medicare Other | Source: Ambulatory Visit | Attending: Orthopedic Surgery | Admitting: Orthopedic Surgery

## 2014-04-23 DIAGNOSIS — M75102 Unspecified rotator cuff tear or rupture of left shoulder, not specified as traumatic: Secondary | ICD-10-CM

## 2014-04-26 ENCOUNTER — Ambulatory Visit (INDEPENDENT_AMBULATORY_CARE_PROVIDER_SITE_OTHER): Payer: Medicare Other | Admitting: Orthopedic Surgery

## 2014-04-26 VITALS — BP 143/85 | Ht 66.0 in | Wt 222.0 lb

## 2014-04-26 DIAGNOSIS — S43429A Sprain of unspecified rotator cuff capsule, initial encounter: Secondary | ICD-10-CM

## 2014-04-26 DIAGNOSIS — M75102 Unspecified rotator cuff tear or rupture of left shoulder, not specified as traumatic: Secondary | ICD-10-CM

## 2014-04-26 NOTE — Patient Instructions (Signed)
Surgery 05/05/14

## 2014-04-27 NOTE — Progress Notes (Signed)
Chief Complaint  Patient presents with  . Results    MRI results review left shoulder    58 years old male status post arthroscopy left shoulder labral tear was doing well until he reached up as he was falling felt something tear in his left shoulder complains of pain and loss of motion some stiffness and nocturnal symptoms. He says he can work during the day but at night it hurts a lot more  He had an MRI which shows the following IMPRESSION: 1. Interval postsurgical changes within the labrum with nondisplaced posterior labral tear and subchondral cyst formation in the posterior inferior glenoid. 2. Interval biceps tenotomy. 3. New undersurface irregularity of the supraspinatus tendon with partial articular surface tearing. No full-thickness rotator cuff Tear.  Persistent pain and weakness in the left shoulder  Review of systems nothing new to add, denies numbness or tingling  At this point I think he just needs to have a repeat operation to address these partial sit surface undersurface tear of his supraspinatus in his posterior labral degeneration.  He is agreeable. Risk benefits explained. Patient has significant financial restraints which are limiting his access to physical therapy and he would like to do this all at home.

## 2014-05-01 ENCOUNTER — Encounter (HOSPITAL_COMMUNITY): Payer: Self-pay

## 2014-05-01 ENCOUNTER — Encounter (HOSPITAL_COMMUNITY)
Admission: RE | Admit: 2014-05-01 | Discharge: 2014-05-01 | Disposition: A | Discharge status: Home / Self Care | Payer: Medicare Other | Source: Ambulatory Visit | Attending: Orthopedic Surgery | Admitting: Orthopedic Surgery

## 2014-05-01 DIAGNOSIS — Z01818 Encounter for other preprocedural examination: Secondary | ICD-10-CM | POA: Diagnosis present

## 2014-05-01 DIAGNOSIS — M719 Bursopathy, unspecified: Principal | ICD-10-CM | POA: Insufficient documentation

## 2014-05-01 DIAGNOSIS — M67919 Unspecified disorder of synovium and tendon, unspecified shoulder: Secondary | ICD-10-CM | POA: Diagnosis not present

## 2014-05-01 LAB — BASIC METABOLIC PANEL
Anion gap: 11 (ref 5–15)
BUN: 16 mg/dL (ref 6–23)
CHLORIDE: 104 meq/L (ref 96–112)
CO2: 27 meq/L (ref 19–32)
Calcium: 9.2 mg/dL (ref 8.4–10.5)
Creatinine, Ser: 1 mg/dL (ref 0.50–1.35)
GFR calc Af Amer: >90 mL/min (ref >90)
GFR calc non Af Amer: 82 mL/min — ABNORMAL LOW (ref >90)
Glucose, Bld: 121 mg/dL — ABNORMAL HIGH (ref 70–99)
Potassium: 4.6 mEq/L (ref 3.7–5.3)
Sodium: 142 mEq/L (ref 137–147)

## 2014-05-01 LAB — SURGICAL PCR SCREEN
MRSA, PCR: NEGATIVE
Staphylococcus aureus: POSITIVE — AB

## 2014-05-01 LAB — HEMOGLOBIN AND HEMATOCRIT, BLOOD
HCT: 38.6 % — ABNORMAL LOW (ref 39.0–52.0)
Hemoglobin: 13.5 g/dL (ref 13.0–17.0)

## 2014-05-01 NOTE — Patient Instructions (Addendum)
Christopher [name]  05/01/2014   Your procedure is scheduled on:   05/05/2014  Report to [facility] at  615  AM.  Call this number if you have problems the morning of surgery: [phone]   Remember:   Do not eat food or drink liquids after midnight.   Take these medicines the morning of surgery with A SIP OF WATER:  Xanax, hydrocodone, protonix, zoloft   Do not wear jewelry, make-up or nail polish.  Do not wear lotions, powders, or perfumes.   Do not shave 48 hours prior to surgery. Men may shave face and neck.  Do not bring valuables to the hospital.  [facility] is not responsible for any belongings or valuables.               Contacts, dentures or bridgework may not be worn into surgery.  Leave suitcase in the car. After surgery it may be brought to your room.  For patients admitted to the hospital, discharge time is determined by your treatment team.               Patients discharged the day of surgery will not be allowed to drive home.  Name and phone number of your driver: family  Special Instructions: Shower using CHG 2 nights before surgery and the night before surgery.  If you shower the day of surgery use CHG.  Use special wash - you have one bottle of CHG for all showers.  You should use approximately 1/3 of the bottle for each shower.   Please read over the following fact sheets that you were given: Pain Booklet, Coughing and Deep Breathing, Surgical Site Infection Prevention, Anesthesia Post-op Instructions and Care and Recovery After Surgery Surgery for Rotator Cuff Tear with Rehab Rotator cuff surgery is only recommended for individuals who have experienced persistent disability for greater than 3 months of non-surgical (conservative) treatment. Surgery is not necessary but is recommended for individuals who experience difficulty completing daily activities or athletes who are unable to compete. Rotator cuff tears do not usually heal without surgical  intervention. If left alone small rotator cuff tears usually become larger. Younger athletes who have a rotator cuff tear may be recommended for surgery without attempting conservative rehabilitation. The purpose of surgery is to regain function of the shoulder joint and eliminate pain associated with the injury. In addition to repairing the tendon tear, the surgery often removes a portion of the bony roof of the shoulder (acromion) as well as the chronically thickened and inflamed membrane below the acromion (subacromial bursa). REASONS NOT TO OPERATE   Infection of the shoulder.  Inability to complete a rehabilitation program.  Patients who have other conditions (emotional or psychological) conditions that contribute to their shoulder condition. RISKS AND COMPLICATIONS  Infection.  Re-tear of the rotator cuff tendons or muscles.  Shoulder stiffness and/or weakness.  Inability to compete in athletics.  Acromioclavicular (AC) joint paint.  Risks of surgery: infection, bleeding, nerve damage, or damage to surrounding tissues. TECHNIQUE There are different surgical procedures used to treat rotator cuff tears. The type of procedure depends on the extent of injury as well as the surgeon's preference. All of the surgical techniques for rotator cuff tears have the same goal of repairing the torn tendon, removing part of the acromion, and removing the subacromial bursa. There are two main types of procedures: arthroscopic and open incision. Arthroscopic procedures are usually completed and you go home the  same day as surgery (outpatient). These procedures use multiple small incisions in which tools and a video camera are placed to work on the shoulder. An electric shaver removes the bursa, then a power burr shaves down the portion of the acromion that places pressure on the rotator cuff. Finally the rotator cuff is sewed (sutured) back to the humeral head. Open incision procedures require a larger  incision. The deltoid muscle is detached from the acromion and a ligament in the shoulder (coracoacromial) is cut in order for the surgeon to access the rotator cuff. The subacromial bursa is removed as well as part of the acromion to give the rotator cuff room to move freely. The torn tendon is then sutured to the humeral head. After the rotator cuff is repaired, then the deltoid is reattached and the incision is closed up.  RECOVERY   Post-operative care depends on the surgical technique and the preferences of your therapist.  Keep the wound clean and dry for the first 10 to 14 days after surgery.  Keep your shoulder and arm in the sling provided to you for as long as you have been instructed to.  You will be given pain medications by your caregiver.  Passive (without using muscles) shoulder movements may be begun immediately after surgery.  It is important to follow through with you rehabilitation program in order to have the best possible recovery. RETURN TO SPORTS   The rehabilitation period will depend on the sport and position you play as well as the success of the operation.  The minimum recovery period is 6 months.  You must have regained complete shoulder motion and strength before returning to sports. SEEK IMMEDIATE MEDICAL CARE IF:   Any medications produce adverse side effects.  Any complications from surgery occur:  Pain, numbness, or coldness in the extremity operated upon.  Discoloration of the nail beds (they become blue or gray) of the extremity operated upon.  Signs of infections (fever, pain, inflammation, redness, or persistent bleeding). EXERCISES  RANGE OF MOTION (ROM) AND STRETCHING EXERCISES - Rotator Cuff Tear, Surgery For These exercises may help you restore your elbow mobility once your physician has discontinued your immobilization period. Beginning these before your provider's approval may result in delayed healing. Your symptoms may resolve with or  without further involvement from your physician, physical therapist or athletic trainer. While completing these exercises, remember:   Restoring tissue flexibility helps normal motion to return to the joints. This allows healthier, less painful movement and activity.  An effective stretch should be held for at least 30 seconds. A stretch should never be painful. You should only feel a gentle lengthening or release in the stretch. ROM - Pendulum   Bend at the waist so that your right / left arm falls away from your body. Support yourself with your opposite hand on a solid surface, such as a table or a countertop.  Your right / left arm should be perpendicular to the ground. If it is not perpendicular, you need to lean over farther. Relax the muscles in your right / left arm and shoulder as much as possible.  Gently sway your hips and trunk so they move your right / left arm without any use of your right / left shoulder muscles.  Progress your movements so that your right / left arm moves side to side, then forward and backward, and finally, both clockwise and counterclockwise.  Complete __________ repetitions in each direction. Many people use this exercise to relieve  discomfort in their shoulder as well as to gain range of motion. Repeat __________ times. Complete this exercise __________ times per day. STRETCH - Flexion, Seated   Sit in a firm chair so that your right / left forearm can rest on a table or on a table or countertop. Your right / left elbow should rest below the height of your shoulder so that your shoulder feels supported and not tense or uncomfortable.  Keeping your right / left shoulder relaxed, lean forward at your waist, allowing your right / left hand to slide forward. Bend forward until you feel a moderate stretch in your shoulder, but before you feel an increase in your pain.  Hold __________ seconds. Slowly return to your starting position. Repeat __________ times.  Complete this exercise __________ times per day.  STRETCH - Flexion, Standing   Stand with good posture. With an underhand grip on your right / left and an overhand grip on the opposite hand, grasp a broomstick or cane so that your hands are a little more than shoulder-width apart.  Keeping your right / left elbow straight and shoulder muscles relaxed, push the stick with your opposite hand to raise your right / left arm in front of your body and then overhead. Raise your arm until you feel a stretch in your right / left shoulder, but before you have increased shoulder pain.  Try to avoid shrugging your right / left shoulder as your arm rises by keeping your shoulder blade tucked down and toward your mid-back spine. Hold __________ seconds.  Slowly return to the starting position. Repeat __________ times. Complete this exercise __________ times per day.  STRETCH - Abduction, Supine   Stand with good posture. With an underhand grip on your right / left and an overhand grip on the opposite hand, grasp a broomstick or cane so that your hands are a little more than shoulder-width apart.  Keeping your right / left elbow straight and shoulder muscles relaxed, push the stick with your opposite hand to raise your right / left arm out to the side of your body and then overhead. Raise your arm until you feel a stretch in your right / left shoulder, but before you have increased shoulder pain.  Try to avoid shrugging your right / left shoulder as your arm rises by keeping your shoulder blade tucked down and toward your mid-back spine. Hold __________ seconds.  Slowly return to the starting position. Repeat __________ times. Complete this exercise __________ times per day.  ROM - Flexion, Active-Assisted  Lie on your back. You may bend your knees for comfort.  Grasp a broomstick or cane so your hands are about shoulder-width apart. Your right / left hand should grip the end of the stick/cane so that  your hand is positioned "thumbs-up," as if you were about to shake hands.  Using your healthy arm to lead, raise your right / left arm overhead until you feel a gentle stretch in your shoulder. Hold __________ seconds.  Use the stick/cane to assist in returning your right / left arm to its starting position. Repeat __________ times. Complete this exercise __________ times per day.  STRETCH - External Rotation   Tuck a folded towel or small ball under your right / left upper arm. Grasp a broomstick or cane with an underhand grasp a little more than shoulder width apart. Bend your elbows to 90 degrees.  Stand with good posture or sit in a chair without arms.  Use your strong  arm to push the stick across your body. Do not allow the towel or ball to fall. This will rotate your right / left arm away from your abdomen. Using the stick turn/rotate your hand and forearm away from your body. Hold __________ seconds. Repeat __________ times. Complete this exercise __________ times per day.  STRENGTHENING EXERCISES - Rotator Cuff Tear, Surgery For These exercises may help you begin to restore your elbow strength in the initial stage of your rehabilitation. Your physician will determine when you begin these exercises depending on the severity of your injury and the integrity of your repaired tissues. Beginning these before your provider's approval may result in delayed healing. While completing these exercises, remember:   Muscles can gain both the endurance and the strength needed for everyday activities through controlled exercises.  Complete these exercises as instructed by your physician, physical therapist or athletic trainer. Progress the resistance and repetitions only as guided.  You may experience muscle soreness or fatigue, but the pain or discomfort you are trying to eliminate should never worsen during these exercises. If this pain does worsen, stop and make certain you are following the  directions exactly. If the pain is still present after adjustments, discontinue the exercise until you can discuss the trouble with your clinician. STRENGTH - Shoulder Flexion, Isometric   With good posture and facing a wall, stand or sit about 4-6 inches away.  Keeping your right / left elbow straight, gently press the top of your fist into the wall. Increase the pressure gradually until you are pressing as hard as you can without shrugging your shoulder or increasing any shoulder discomfort.  Hold __________ seconds. Release the tension slowly. Relax your shoulder muscles completely before you do the next repetition. Repeat __________ times. Complete this exercise __________ times per day.  STRENGTH - Shoulder Abductors, Isometric   With good posture, stand or sit about 4-6 inches from a wall with your right / left side facing the wall.  Bend your right / left elbow. Gently press your right / left elbow into the wall. Increase the pressure gradually until you are pressing as hard as you can without shrugging your shoulder or increasing any shoulder discomfort.  Hold __________ seconds.  Release the tension slowly. Relax your shoulder muscles completely before you do the next repetition. Repeat __________ times. Complete this exercise __________ times per day.  STRENGTH - Internal Rotators, Isometric   Keep your right / left elbow at your side and bend it 90 degrees.  Step into a door frame so that the inside of your right / left wrist can press against the door frame without your upper arm leaving your side.  Gently press your right / left wrist into the door frame as if you were trying to draw the palm of your hand to your abdomen. Gradually increase the tension until you are pressing as hard as you can without shrugging your shoulder or increasing any shoulder discomfort.  Hold __________ seconds.  Release the tension slowly. Relax your shoulder muscles completely before you do the  next repetition. Repeat __________ times. Complete this exercise __________ times per day.  STRENGTH - External Rotators, Isometric   Keep your right / left elbow at your side and bend it 90 degrees.  Step into a door frame so that the outside of your right / left wrist can press against the door frame without your upper arm leaving your side.  Gently press your right / left wrist into the  door frame as if you were trying to swing the back of your hand away from your abdomen. Gradually increase the tension until you are pressing as hard as you can without shrugging your shoulder or increasing any shoulder discomfort.  Hold __________ seconds.  Release the tension slowly. Relax your shoulder muscles completely before you do the next repetition. Repeat __________ times. Complete this exercise __________ times per day.  Document Released: 08/18/2005 Document Revised: 11/10/2011 Document Reviewed: 11/30/2008 [facility] Patient Information 2015 [place], [place]. This information is not intended to replace advice given to you by your health care provider. Make sure you discuss any questions you have with your health care provider. Rotator Cuff Injury Rotator cuff injury is any type of injury to the set of muscles and tendons that make up the stabilizing unit of your shoulder. This unit holds the ball of your upper arm bone (humerus) in the socket of your shoulder blade (scapula).  CAUSES Injuries to your rotator cuff most commonly come from sports or activities that cause your arm to be moved repeatedly over your head. Examples of this include throwing, weight lifting, swimming, or racquet sports. Long lasting (chronic) irritation of your rotator cuff can cause soreness and swelling (inflammation), bursitis, and eventual damage to your tendons, such as a tear (rupture). SIGNS AND SYMPTOMS Acute rotator cuff tear:  Sudden tearing sensation followed by severe pain shooting from your upper shoulder down  your arm toward your elbow.  Decreased range of motion of your shoulder because of pain and muscle spasm.  Severe pain.  Inability to raise your arm out to the side because of pain and loss of muscle power (large tears). Chronic rotator cuff tear:  Pain that usually is worse at night and may interfere with sleep.  Gradual weakness and decreased shoulder motion as the pain worsens.  Decreased range of motion. Rotator cuff tendinitis:  Deep ache in your shoulder and the outside upper arm over your shoulder.  Pain that comes on gradually and becomes worse when lifting your arm to the side or turning it inward. DIAGNOSIS Rotator cuff injury is diagnosed through a medical history, physical exam, and imaging exam. The medical history helps determine the type of rotator cuff injury. Your health care provider will look at your injured shoulder, feel the injured area, and ask you to move your shoulder in different positions. X-ray exams typically are done to rule out other causes of shoulder pain, such as fractures. MRI is the exam of choice for the most severe shoulder injuries because the images show muscles and tendons.  TREATMENT  Chronic tear:  Medicine for pain, such as acetaminophen or ibuprofen.  Physical therapy and range-of-motion exercises may be helpful in maintaining shoulder function and strength.  Steroid injections into your shoulder joint.  Surgical repair of the rotator cuff if the injury does not heal with noninvasive treatment. Acute tear:  Anti-inflammatory medicines such as ibuprofen and naproxen to help reduce pain and swelling.  A sling to help support your arm and rest your rotator cuff muscles. Long-term use of a sling is not advised. It may cause significant stiffening of the shoulder joint.  Surgery may be considered within a few weeks, especially in younger, active people, to return the shoulder to full function.  Indications for surgical treatment include  the following:  Age younger than 63 years.  Rotator cuff tears that are complete.  Physical therapy, rest, and anti-inflammatory medicines have been used for 6-8 weeks, with no improvement.  Employment or sporting activity that requires constant shoulder use. Tendinitis:  Anti-inflammatory medicines such as ibuprofen and naproxen to help reduce pain and swelling.  A sling to help support your arm and rest your rotator cuff muscles. Long-term use of a sling is not advised. It may cause significant stiffening of the shoulder joint.  Severe tendinitis may require:  Steroid injections into your shoulder joint.  Physical therapy.  Surgery. HOME CARE INSTRUCTIONS   Apply ice to your injury:  Put ice in a plastic bag.  Place a towel between your skin and the bag.  Leave the ice on for 20 minutes, 2-3 times a day.  If you have a shoulder immobilizer (sling and straps), wear it until told otherwise by your health care provider.  You may want to sleep on several pillows or in a recliner at night to lessen swelling and pain.  Only take over-the-counter or prescription medicines for pain, discomfort, or fever as directed by your health care provider.  Do simple hand squeezing exercises with a soft rubber ball to decrease hand swelling. SEEK MEDICAL CARE IF:   Your shoulder pain increases, or new pain or numbness develops in your arm, hand, or fingers.  Your hand or fingers are colder than your other hand. SEEK IMMEDIATE MEDICAL CARE IF:   Your arm, hand, or fingers are numb or tingling.  Your arm, hand, or fingers are increasingly swollen and painful, or they turn white or blue. MAKE SURE YOU:  Understand these instructions.  Will watch your condition.  Will get help right away if you are not doing well or get worse. Document Released: 08/15/2000 Document Revised: 08/23/2013 Document Reviewed: 03/30/2013 [facility] Patient Information 2015 [place], [place]. This information  is not intended to replace advice given to you by your health care provider. Make sure you discuss any questions you have with your health care provider. PATIENT INSTRUCTIONS POST-ANESTHESIA  IMMEDIATELY FOLLOWING SURGERY:  Do not drive or operate machinery for the first twenty four hours after surgery.  Do not make any important decisions for twenty four hours after surgery or while taking narcotic pain medications or sedatives.  If you develop intractable nausea and vomiting or a severe headache please notify your doctor immediately.  FOLLOW-UP:  Please make an appointment with your surgeon as instructed. You do not need to follow up with anesthesia unless specifically instructed to do so.  WOUND CARE INSTRUCTIONS (if applicable):  Keep a dry clean dressing on the anesthesia/puncture wound site if there is drainage.  Once the wound has quit draining you may leave it open to air.  Generally you should leave the bandage intact for twenty four hours unless there is drainage.  If the epidural site drains for more than 36-48 hours please call the anesthesia department.  QUESTIONS?:  Please feel free to call your physician or the hospital operator if you have any questions, and they will be happy to assist you.

## 2014-05-01 NOTE — Pre-Procedure Instructions (Signed)
Patient given information to sign up for my chart at home. 

## 2014-05-02 ENCOUNTER — Telehealth: Payer: Self-pay | Admitting: Orthopedic Surgery

## 2014-05-02 NOTE — Telephone Encounter (Signed)
Regarding out-patient surgery scheduled at [place], [place], [place] [phone].  Per Cassie R, no pre-authorization required for out-patient surgery.  Ref# [id number], 05/02/14, 10:52a.m.

## 2014-05-02 NOTE — Pre-Procedure Instructions (Signed)
Patient called for him to come and get Mupircion and start using it bid for 5 days. He verbalized understanding.

## 2014-05-04 ENCOUNTER — Encounter (HOSPITAL_COMMUNITY): Payer: Self-pay | Admitting: Pharmacy Technician

## 2014-05-04 NOTE — H&P (Signed)
Christopher [name] is an 58 y.o. male.   Chief Complaint: left shoulder pain  HPI: 58 years old male status post arthroscopy left shoulder labral tear was doing well until he reached up as he was falling felt something tear in his left shoulder complains of pain and loss of motion some stiffness and nocturnal symptoms. He says he can work during the day but at night it hurts a lot more.  We treated him initially with activity modification eventually progressing to a cortisone injection he does his own physical therapy secondary to cost he did not improve and his MRI showed he had a torn rotator cuff. We discussed possible treatment options which included continued nonoperative treatment versus surgical intervention and he has opted for surgical intervention.  No catching or locking  No numbness   Past Medical History  Diagnosis Date  . Hyperlipidemia   . Anxiety   . Depression   . GERD (gastroesophageal reflux disease)   . Sleep apnea     uses CIPAP machine at night  . Heart murmur     Years ago- not now  . Headache(784.0)     after surgery  . Complication of anesthesia     pt had a hard time being able to move after spinal anesthesia , 3-4 hours  . PONV (postoperative nausea and vomiting)   . Arthritis   . Diabetes mellitus without complication     pt sts borderline    Past Surgical History  Procedure Laterality Date  . Knee arthroscopy      left knee  . Knee arthroscopy      right knee   . Spinal fusion      x 2  . Neck fusion    . Colonoscopy  06/27/2011    Procedure: COLONOSCOPY;  Surgeon: Rogene Houston, MD;  Location: [facility];  Service: Endoscopy;  Laterality: N/A;  9:00 / Pt to be here at 9am for 10:45 procedure, benign polyps removed  . Hernia repair      umbilical hernia  . Cardiac catheterization    . Chondroplasty  08/15/2011    Procedure: CHONDROPLASTY;  Surgeon: [name], MD;  Location: [facility];  Service: Orthopedics;  Laterality: Left;  .  Lumbar laminectomy/decompression microdiscectomy  09/14/2012    Procedure: LUMBAR LAMINECTOMY/DECOMPRESSION MICRODISCECTOMY 1 LEVEL;  Surgeon: Floyce Stakes, MD;  Location: [facility];  Service: Neurosurgery;  Laterality: Right;  Right Lumbar three-four Diskectomy  . Shoulder surgery Right     Open Mumford procedure  . Appendectomy    . Shoulder arthroscopy with bicepstenotomy Left 09/23/2013    Procedure: SHOULDER ARTHROSCOPY WITH BICEPSTENOTOMY AND EXTENSIVE DEBRIDEMENT;  Surgeon: Carole Civil, MD;  Location: [facility];  Service: Orthopedics;  Laterality: Left;  . Back surgery      neck and back fusion    Family History  Problem Relation Age of Onset  . Diabetes    . Lung disease    . Arthritis    . Anesthesia problems Neg Hx   . Hypotension Neg Hx   . Malignant hyperthermia Neg Hx   . Pseudochol deficiency Neg Hx    Social History:  reports that he has been smoking Cigarettes.  He has a 10.25 pack-year smoking history. He does not have any smokeless tobacco history on file. He reports that he does not drink alcohol or use illicit drugs.  Allergies:  Allergies  Allergen Reactions  . Celebrex [Celecoxib] Itching and Swelling  All over  . Codeine Nausea And Vomiting    Extreme stomach pain. This includes anything with the derivative of codeine in it.  . Doxycycline Swelling    Made tongue turn black   . Cortisone Swelling  . Oxycodone-Acetaminophen Itching    Can tolerate with benadryl   . Prednisone Swelling  . Relafen [Nabumetone] Swelling    No prescriptions prior to admission    No results found for this or any previous visit (from the past 48 hour(s)). No results found.  ROS He has chronic pain. Constitutional symptoms negative redness and more of the eyes if ENT negative no recent chest pain or shortness of breath. No heartburn. No recent urgency or difficulty or painful urination. No recent skin changes or unsteady gait he does have a history of lumbar  disc disease and cervical spine disease. No hallucinations easy bleeding or excessive thirst.  There were no vitals taken for this visit. Physical Exam  Mr. Weissinger is a well-developed well-nourished male. He has normal grooming and hygiene. He is oriented to person place and time. His mood and affect are normal. His gait and station show no major abnormalities he does have a waddling appearance to his gait secondary to his chronic back problems  Has mild tenderness at the base of the cervical spine is nonradiating has a negative Spurling sign. Some of his motion has been lost secondary to degenerative disease.  Right shoulder full range of motion no tenderness normal strength and stability skin intact good pulses no lymphadenopathy and normal sensation.  Left upper extremity has painful joint line to palpation painful range of motion which is limited weakness of the rotator cuff but no instability skin is normal.t no lymphadenopathy normal sensation reflexes and coordination  Lower extremities grossly normal noting his chronic arthritis slight decrease in motion but ligament stable strength normal skin intact good distal pulses and sensation He has normal pulse temperature  no edema  Assessment/Plan MRI findings and his op report Glenohumeral Joint: Glenohumeral degenerative changes are again noted with chondral irregularity and subchondral cyst formation within the posterior inferior glenoid. There is small joint effusion. No loose bodies are seen.   Labrum: Nondisplaced tear of the posterior labrum is noted as reported on the operative report. There has been probable partial debridement of the labrum superiorly and posteriorly. No displaced labral tear or paralabral cyst identified.   Bones:  No significant extra-articular osseous findings.   IMPRESSION: 1. Interval postsurgical changes within the labrum with nondisplaced posterior labral tear and subchondral cyst formation in  the posterior inferior glenoid. 2. Interval biceps tenotomy. 3. New undersurface irregularity of the supraspinatus tendon with partial articular surface tearing. No full-thickness rotator cuff Tear.  PRE-OPERATIVE DIAGNOSIS:  Adhesive capsulitis left shoulder, synovitis left shoulder, the humeral arthritis left shoulder, biceps tendon tear, subscapularis tear. POST-OPERATIVE DIAGNOSIS: Adhesive capsulitis left shoulder synovitis left shoulder glenohumeral arthritis left shoulder biceps tendon tear Loose body    PROCEDURE: Arthroscopy left shoulder, biceps tenotomy, extensive debridement, loose body removal   Operative findings    Inflamed synovial tissue,   glenohumeral arthritis.   Degenerative biceps tendon tear.   Loose body.   Bursitis.    Rotator cuff tear left shoulder  glenohumeral arthritis left shoulder   The plan is to arthroscopically evaluate the glenohumeral joint and the labral work that needs to be done and then attempt arthroscopic repair of the cuff if not in then open the shoulder to take down this partial tear of  his rotator cuff and do a complete repair washout the joint remove any loose bodies or labral tearing.  [name] 05/04/2014, 12:11 PM

## 2014-05-05 ENCOUNTER — Ambulatory Visit (HOSPITAL_COMMUNITY)
Admission: RE | Admit: 2014-05-05 | Discharge: 2014-05-05 | Disposition: A | Discharge status: Home / Self Care | Payer: Medicare Other | Source: Ambulatory Visit | Attending: Orthopedic Surgery | Admitting: Orthopedic Surgery

## 2014-05-05 ENCOUNTER — Encounter (HOSPITAL_COMMUNITY): Payer: Self-pay | Admitting: Anesthesiology

## 2014-05-05 ENCOUNTER — Encounter (HOSPITAL_COMMUNITY)
Admission: RE | Disposition: A | Discharge status: Home / Self Care | Payer: Self-pay | Source: Ambulatory Visit | Attending: Orthopedic Surgery

## 2014-05-05 ENCOUNTER — Ambulatory Visit (HOSPITAL_COMMUNITY): Payer: Medicare Other | Admitting: Anesthesiology

## 2014-05-05 ENCOUNTER — Encounter (HOSPITAL_COMMUNITY): Payer: Medicare Other | Admitting: Anesthesiology

## 2014-05-05 DIAGNOSIS — F17200 Nicotine dependence, unspecified, uncomplicated: Secondary | ICD-10-CM | POA: Diagnosis not present

## 2014-05-05 DIAGNOSIS — M9420 Chondromalacia, unspecified site: Secondary | ICD-10-CM | POA: Insufficient documentation

## 2014-05-05 DIAGNOSIS — E119 Type 2 diabetes mellitus without complications: Secondary | ICD-10-CM | POA: Insufficient documentation

## 2014-05-05 DIAGNOSIS — M24019 Loose body in unspecified shoulder: Secondary | ICD-10-CM | POA: Insufficient documentation

## 2014-05-05 DIAGNOSIS — F341 Dysthymic disorder: Secondary | ICD-10-CM | POA: Diagnosis not present

## 2014-05-05 DIAGNOSIS — E785 Hyperlipidemia, unspecified: Secondary | ICD-10-CM | POA: Diagnosis not present

## 2014-05-05 DIAGNOSIS — R011 Cardiac murmur, unspecified: Secondary | ICD-10-CM | POA: Insufficient documentation

## 2014-05-05 DIAGNOSIS — M19019 Primary osteoarthritis, unspecified shoulder: Secondary | ICD-10-CM

## 2014-05-05 DIAGNOSIS — Z5333 Arthroscopic surgical procedure converted to open procedure: Secondary | ICD-10-CM | POA: Insufficient documentation

## 2014-05-05 DIAGNOSIS — M75102 Unspecified rotator cuff tear or rupture of left shoulder, not specified as traumatic: Secondary | ICD-10-CM

## 2014-05-05 DIAGNOSIS — W19XXXA Unspecified fall, initial encounter: Secondary | ICD-10-CM | POA: Diagnosis not present

## 2014-05-05 DIAGNOSIS — S43429A Sprain of unspecified rotator cuff capsule, initial encounter: Secondary | ICD-10-CM

## 2014-05-05 DIAGNOSIS — G4730 Sleep apnea, unspecified: Secondary | ICD-10-CM | POA: Insufficient documentation

## 2014-05-05 DIAGNOSIS — K219 Gastro-esophageal reflux disease without esophagitis: Secondary | ICD-10-CM | POA: Diagnosis not present

## 2014-05-05 DIAGNOSIS — M75100 Unspecified rotator cuff tear or rupture of unspecified shoulder, not specified as traumatic: Secondary | ICD-10-CM

## 2014-05-05 HISTORY — PX: SHOULDER OPEN ROTATOR CUFF REPAIR: SHX2407

## 2014-05-05 HISTORY — PX: SHOULDER ARTHROSCOPY WITH ROTATOR CUFF REPAIR: SHX5685

## 2014-05-05 LAB — GLUCOSE, CAPILLARY
GLUCOSE-CAPILLARY: 107 mg/dL — AB (ref 70–99)
Glucose-Capillary: 158 mg/dL — ABNORMAL HIGH (ref 70–99)

## 2014-05-05 SURGERY — ARTHROSCOPY, SHOULDER, WITH ROTATOR CUFF REPAIR
Anesthesia: General | Site: Shoulder | Laterality: Left

## 2014-05-05 MED ORDER — ACETAMINOPHEN 10 MG/ML IV SOLN
1000.0000 mg | Freq: Four times a day (QID) | INTRAVENOUS | Status: DC
  Administered 2014-05-05: 1000 mg via INTRAVENOUS
  Filled 2014-05-05: qty 100

## 2014-05-05 MED ORDER — LIDOCAINE HCL 1 % IJ SOLN
INTRAMUSCULAR | Status: DC | PRN
  Administered 2014-05-05 (×2): 50 mg via INTRADERMAL

## 2014-05-05 MED ORDER — SUCCINYLCHOLINE CHLORIDE 20 MG/ML IJ SOLN
INTRAMUSCULAR | Status: AC
  Filled 2014-05-05: qty 1

## 2014-05-05 MED ORDER — LACTATED RINGERS IV SOLN
INTRAVENOUS | Status: DC
  Administered 2014-05-05: 09:00:00 via INTRAVENOUS
  Administered 2014-05-05: 1000 mL via INTRAVENOUS

## 2014-05-05 MED ORDER — HYDROCODONE-ACETAMINOPHEN 10-325 MG PO TABS: 1.0000 | ORAL_TABLET | ORAL | Status: DC | PRN

## 2014-05-05 MED ORDER — GLYCOPYRROLATE 0.2 MG/ML IJ SOLN
INTRAMUSCULAR | Status: DC | PRN
  Administered 2014-05-05: .6 mg via INTRAVENOUS

## 2014-05-05 MED ORDER — ONDANSETRON HCL 4 MG/2ML IJ SOLN
INTRAMUSCULAR | Status: AC
  Filled 2014-05-05: qty 2

## 2014-05-05 MED ORDER — ONDANSETRON HCL 4 MG/2ML IJ SOLN: 4.0000 mg | Freq: Once | INTRAMUSCULAR | Status: DC | PRN

## 2014-05-05 MED ORDER — PROPOFOL 10 MG/ML IV BOLUS
INTRAVENOUS | Status: AC
  Filled 2014-05-05: qty 20

## 2014-05-05 MED ORDER — FENTANYL CITRATE 0.05 MG/ML IJ SOLN
INTRAMUSCULAR | Status: DC | PRN
  Administered 2014-05-05 (×3): 50 ug via INTRAVENOUS

## 2014-05-05 MED ORDER — HYDROMORPHONE HCL 4 MG PO TABS: 4.0000 mg | ORAL_TABLET | ORAL | Status: DC | PRN

## 2014-05-05 MED ORDER — ROCURONIUM BROMIDE 50 MG/5ML IV SOLN
INTRAVENOUS | Status: AC
  Filled 2014-05-05: qty 1

## 2014-05-05 MED ORDER — HYDROMORPHONE HCL 2 MG PO TABS
2.0000 mg | ORAL_TABLET | Freq: Once | ORAL | Status: AC
  Administered 2014-05-05: 2 mg via ORAL
  Filled 2014-05-05: qty 1

## 2014-05-05 MED ORDER — NEOSTIGMINE METHYLSULFATE 10 MG/10ML IV SOLN
INTRAVENOUS | Status: DC | PRN
  Administered 2014-05-05: 4 mg via INTRAVENOUS

## 2014-05-05 MED ORDER — CEFAZOLIN SODIUM-DEXTROSE 2-3 GM-% IV SOLR: 2.0000 g | INTRAVENOUS | Status: DC

## 2014-05-05 MED ORDER — GLYCOPYRROLATE 0.2 MG/ML IJ SOLN
INTRAMUSCULAR | Status: AC
  Filled 2014-05-05: qty 1

## 2014-05-05 MED ORDER — BUPIVACAINE-EPINEPHRINE (PF) 0.5% -1:200000 IJ SOLN
INTRAMUSCULAR | Status: AC
  Filled 2014-05-05: qty 30

## 2014-05-05 MED ORDER — SODIUM CHLORIDE 0.9 % IR SOLN
Status: DC | PRN
  Administered 2014-05-05: 1000 mL

## 2014-05-05 MED ORDER — LIDOCAINE HCL (PF) 1 % IJ SOLN
INTRAMUSCULAR | Status: AC
  Filled 2014-05-05: qty 5

## 2014-05-05 MED ORDER — EPHEDRINE SULFATE 50 MG/ML IJ SOLN
INTRAMUSCULAR | Status: DC | PRN
  Administered 2014-05-05 (×3): 5 mg via INTRAVENOUS

## 2014-05-05 MED ORDER — FENTANYL CITRATE 0.05 MG/ML IJ SOLN
25.0000 ug | INTRAMUSCULAR | Status: DC | PRN
  Administered 2014-05-05 (×2): 50 ug via INTRAVENOUS
  Filled 2014-05-05: qty 2

## 2014-05-05 MED ORDER — CEFAZOLIN SODIUM-DEXTROSE 2-3 GM-% IV SOLR
INTRAVENOUS | Status: AC
  Filled 2014-05-05: qty 50

## 2014-05-05 MED ORDER — SUCCINYLCHOLINE CHLORIDE 20 MG/ML IJ SOLN
INTRAMUSCULAR | Status: DC | PRN
  Administered 2014-05-05: 140 mg via INTRAVENOUS

## 2014-05-05 MED ORDER — MIDAZOLAM HCL 2 MG/2ML IJ SOLN
1.0000 mg | INTRAMUSCULAR | Status: DC | PRN
  Administered 2014-05-05 (×2): 2 mg via INTRAVENOUS
  Filled 2014-05-05: qty 2

## 2014-05-05 MED ORDER — PROPOFOL 10 MG/ML IV BOLUS
INTRAVENOUS | Status: DC | PRN
  Administered 2014-05-05: 200 mg via INTRAVENOUS

## 2014-05-05 MED ORDER — GLYCOPYRROLATE 0.2 MG/ML IJ SOLN
0.2000 mg | Freq: Once | INTRAMUSCULAR | Status: AC
  Administered 2014-05-05: 0.2 mg via INTRAVENOUS

## 2014-05-05 MED ORDER — SODIUM CHLORIDE 0.9 % IR SOLN
Status: DC | PRN
  Administered 2014-05-05 (×3)

## 2014-05-05 MED ORDER — NEOSTIGMINE METHYLSULFATE 10 MG/10ML IV SOLN
INTRAVENOUS | Status: AC
  Filled 2014-05-05: qty 1

## 2014-05-05 MED ORDER — BUPIVACAINE-EPINEPHRINE (PF) 0.5% -1:200000 IJ SOLN
INTRAMUSCULAR | Status: AC
  Filled 2014-05-05: qty 60

## 2014-05-05 MED ORDER — ONDANSETRON HCL 4 MG/2ML IJ SOLN
4.0000 mg | Freq: Once | INTRAMUSCULAR | Status: AC
  Administered 2014-05-05: 4 mg via INTRAVENOUS
  Filled 2014-05-05: qty 2

## 2014-05-05 MED ORDER — GLYCOPYRROLATE 0.2 MG/ML IJ SOLN
INTRAMUSCULAR | Status: AC
  Filled 2014-05-05: qty 2

## 2014-05-05 MED ORDER — MIDAZOLAM HCL 2 MG/2ML IJ SOLN
INTRAMUSCULAR | Status: AC
  Filled 2014-05-05: qty 2

## 2014-05-05 MED ORDER — FENTANYL CITRATE 0.05 MG/ML IJ SOLN
INTRAMUSCULAR | Status: AC
  Filled 2014-05-05: qty 5

## 2014-05-05 MED ORDER — CHLORHEXIDINE GLUCONATE 4 % EX LIQD: 60.0000 mL | Freq: Once | CUTANEOUS | Status: DC

## 2014-05-05 MED ORDER — EPINEPHRINE HCL 1 MG/ML IJ SOLN
INTRAMUSCULAR | Status: AC
  Filled 2014-05-05: qty 7

## 2014-05-05 MED ORDER — DIPHENHYDRAMINE HCL 25 MG PO TABS: 25.0000 mg | ORAL_TABLET | Freq: Four times a day (QID) | ORAL | Status: DC | PRN

## 2014-05-05 MED ORDER — ONDANSETRON HCL 4 MG/2ML IJ SOLN
INTRAMUSCULAR | Status: DC | PRN
  Administered 2014-05-05: 4 mg via INTRAVENOUS

## 2014-05-05 MED ORDER — BUPIVACAINE-EPINEPHRINE 0.5% -1:200000 IJ SOLN
INTRAMUSCULAR | Status: DC | PRN
  Administered 2014-05-05: 90 mL

## 2014-05-05 MED ORDER — ROCURONIUM BROMIDE 100 MG/10ML IV SOLN
INTRAVENOUS | Status: DC | PRN
  Administered 2014-05-05: 30 mg via INTRAVENOUS
  Administered 2014-05-05 (×3): 10 mg via INTRAVENOUS

## 2014-05-05 MED ORDER — ONDANSETRON HCL 4 MG/2ML IJ SOLN
4.0000 mg | Freq: Once | INTRAMUSCULAR | Status: AC
  Administered 2014-05-05: 4 mg via INTRAVENOUS

## 2014-05-05 SURGICAL SUPPLY — 99 items
ANCHOR CORKSCREW 5.0 FIBERWIRE (Anchor) ×3 IMPLANT
ANCHOR SUT 5.5 SPEEDSCREW (Screw) ×6 IMPLANT
BAG HAMPER (MISCELLANEOUS) ×3 IMPLANT
BIT DRILL 2.0MX128MM (BIT) IMPLANT
BLADE 10 SAFETY STRL DISP (BLADE) ×3 IMPLANT
BLADE 11 SAFETY STRL DISP (BLADE) ×3 IMPLANT
BLADE AGGRESSIVE PLUS 4.0 (BLADE) ×3 IMPLANT
BLADE AVERAGE 25MMX9MM (BLADE)
BLADE AVERAGE 25X9 (BLADE) IMPLANT
BLADE HEX COATED 2.75 (ELECTRODE) ×3 IMPLANT
BNDG COHESIVE 4X5 TAN STRL (GAUZE/BANDAGES/DRESSINGS) ×3 IMPLANT
BUR 5.0 BARRELL (BURR) IMPLANT
BUR BARRELL 4.0 (BURR) IMPLANT
BUR ROUND 5.0 (BURR) IMPLANT
CANNULA DRILOCK 5.0MMX75MM (CANNULA)
CANNULA DRILOCK 5.0X75 (CANNULA) IMPLANT
CANNULA DRILOCK 6.5MMX75MM (CANNULA) ×1
CANNULA DRILOCK 6.5X75 (CANNULA) ×2 IMPLANT
CANNULA DRILOCK 8.0MMX75MM (CANNULA)
CANNULA DRILOCK 8.0X75 (CANNULA) IMPLANT
CHLORAPREP W/TINT 26ML (MISCELLANEOUS) ×3 IMPLANT
CLOTH BEACON ORANGE TIMEOUT ST (SAFETY) ×3 IMPLANT
CONNECTOR PERFECT PASSER (CONNECTOR) ×3 IMPLANT
COVER LIGHT HANDLE STERIS (MISCELLANEOUS) ×6 IMPLANT
COVER PROBE W GEL 5X96 (DRAPES) ×3 IMPLANT
DECANTER SPIKE VIAL GLASS SM (MISCELLANEOUS) ×3 IMPLANT
DRAPE PROXIMA HALF (DRAPES) IMPLANT
DRAPE SHOULDER BEACH CHAIR (DRAPES) ×3 IMPLANT
DRAPE U-SHAPE 47X51 STRL (DRAPES) ×3 IMPLANT
DRESSING ALLEVYN BORDER 5X5 (GAUZE/BANDAGES/DRESSINGS) ×3 IMPLANT
DRESSING ALLEVYN BORDER HEEL (GAUZE/BANDAGES/DRESSINGS) ×3 IMPLANT
DRSG MEPILEX BORDER 4X8 (GAUZE/BANDAGES/DRESSINGS) IMPLANT
ELECT REM PT RETURN 9FT ADLT (ELECTROSURGICAL) ×3
ELECTRODE REM PT RTRN 9FT ADLT (ELECTROSURGICAL) ×1 IMPLANT
FIBERSTICK 2 (SUTURE) IMPLANT
GAUZE SPONGE 4X4 16PLY XRAY LF (GAUZE/BANDAGES/DRESSINGS) ×3 IMPLANT
GLOVE BIOGEL M 7.0 STRL (GLOVE) ×3 IMPLANT
GLOVE ECLIPSE 6.5 STRL STRAW (GLOVE) ×3 IMPLANT
GLOVE INDICATOR 7.0 STRL GRN (GLOVE) ×6 IMPLANT
GLOVE SKINSENSE NS SZ8.0 LF (GLOVE) ×2
GLOVE SKINSENSE STRL SZ8.0 LF (GLOVE) ×1 IMPLANT
GLOVE SS N UNI LF 8.5 STRL (GLOVE) ×3 IMPLANT
GOWN STRL REUS W/TWL LRG LVL3 (GOWN DISPOSABLE) ×6 IMPLANT
GOWN STRL REUS W/TWL XL LVL3 (GOWN DISPOSABLE) ×3 IMPLANT
IMMOBILIZER SHOULDER LGE (ORTHOPEDIC SUPPLIES) ×3 IMPLANT
IMMOBILIZER SHOULDER MED (ORTHOPEDIC SUPPLIES) IMPLANT
INST SET MINOR BONE (KITS) ×3 IMPLANT
IV NS IRRIG 3000ML ARTHROMATIC (IV SOLUTION) ×12 IMPLANT
KIT BLADEGUARD II DBL (SET/KITS/TRAYS/PACK) ×3 IMPLANT
KIT POSITION SHOULDER SCHLEI (MISCELLANEOUS) ×3 IMPLANT
KIT ROOM TURNOVER APOR (KITS) ×3 IMPLANT
MANIFOLD NEPTUNE II (INSTRUMENTS) ×3 IMPLANT
MARKER SKIN DUAL TIP RULER LAB (MISCELLANEOUS) ×3 IMPLANT
NEEDLE HYPO 18GX1.5 BLUNT FILL (NEEDLE) ×3 IMPLANT
NEEDLE HYPO 21X1.5 SAFETY (NEEDLE) ×3 IMPLANT
NEEDLE MA TROC 1/2 (NEEDLE) ×3 IMPLANT
NEEDLE MAYO 6 CRC TAPER PT (NEEDLE) IMPLANT
NEEDLE SCORPION (NEEDLE) IMPLANT
NEEDLE SPNL 18GX3.5 QUINCKE PK (NEEDLE) ×3 IMPLANT
NS IRRIG 1000ML POUR BTL (IV SOLUTION) ×3 IMPLANT
PACK BASIC III (CUSTOM PROCEDURE TRAY) ×2
PACK SRG BSC III STRL LF ECLPS (CUSTOM PROCEDURE TRAY) ×1 IMPLANT
PAD ABD 5X9 TENDERSORB (GAUZE/BANDAGES/DRESSINGS) IMPLANT
PAD ARMBOARD 7.5X6 YLW CONV (MISCELLANEOUS) ×3 IMPLANT
PASSER SUT CAPTURE FIRST (SUTURE) IMPLANT
PENCIL HANDSWITCHING (ELECTRODE) ×3 IMPLANT
RASP SM TEAR CROSS CUT (RASP) IMPLANT
SET ARTHROSCOPY INST (INSTRUMENTS) ×3 IMPLANT
SET BASIN LINEN APH (SET/KITS/TRAYS/PACK) ×3 IMPLANT
STAPLER VISISTAT 35W (STAPLE) ×3 IMPLANT
STOCKINETTE IMPERVIOUS LG (DRAPES) ×3 IMPLANT
SUT BONE WAX W31G (SUTURE) IMPLANT
SUT ETHIBOND NAB OS 4 #2 30IN (SUTURE) ×3 IMPLANT
SUT ETHILON 3 0 FSL (SUTURE) ×3 IMPLANT
SUT FIBERWIRE #2 38 REV NDL BL (SUTURE)
SUT FIBERWIRE #2 38 T-5 BLUE (SUTURE)
SUT LASSO 45 DEGREE (SUTURE) IMPLANT
SUT LASSO 45 DEGREE LEFT (SUTURE) IMPLANT
SUT LASSO 45D RIGHT (SUTURE) IMPLANT
SUT MNCRL 0 VIOLET CTX 36 (SUTURE) IMPLANT
SUT MON AB 0 CT1 (SUTURE) ×3 IMPLANT
SUT MON AB 2-0 CT1 36 (SUTURE) IMPLANT
SUT MONOCRYL 0 CTX 36 (SUTURE)
SUT PDS AB CT VIOLET #0 27IN (SUTURE) ×3 IMPLANT
SUT PERFECTPASSER WHITE CART (SUTURE) ×3 IMPLANT
SUT PROLENE 2 0 FS (SUTURE) IMPLANT
SUT SMART STITCH CARTRIDGE (SUTURE) ×3 IMPLANT
SUT VIC AB 1 CT1 27 (SUTURE) ×2
SUT VIC AB 1 CT1 27XBRD ANTBC (SUTURE) ×1 IMPLANT
SUTURE FIBERWR #2 38 T-5 BLUE (SUTURE) IMPLANT
SUTURE FIBERWR#2 38 REV NDL BL (SUTURE) IMPLANT
SYR 30ML LL (SYRINGE) ×3 IMPLANT
SYR 5ML LL (SYRINGE) ×3 IMPLANT
SYR BULB IRRIGATION 50ML (SYRINGE) ×3 IMPLANT
TOWEL OR 17X26 4PK STRL BLUE (TOWEL DISPOSABLE) IMPLANT
TUBING ARTHROSCOPY INFLOW/OUT (IRRIGATION / IRRIGATOR) ×3 IMPLANT
WAND 90 DEG TURBOVAC W/CORD (SURGICAL WAND) ×3 IMPLANT
YANKAUER SUCT 12FT TUBE ARGYLE (SUCTIONS) ×3 IMPLANT
YANKAUER SUCT BULB TIP 10FT TU (MISCELLANEOUS) ×6 IMPLANT

## 2014-05-05 NOTE — Anesthesia Postprocedure Evaluation (Signed)
  Anesthesia Post-op Note  Patient: Christopher [name]  Procedure(s) Performed: Procedure(s): SHOULDER ARTHROSCOPY LIMITED DEBRIDEMENT (Left) ROTATOR CUFF REPAIR SHOULDER OPEN (Left)  Patient Location: PACU  Anesthesia Type:General  Level of Consciousness: awake, alert , oriented and patient cooperative  Airway and Oxygen Therapy: Patient connected to face mask oxygen  Post-op Pain: mild  Post-op Assessment: Post-op Vital signs reviewed, Patient's Cardiovascular Status Stable, Respiratory Function Stable, Patent Airway and Pain level controlled  Post-op Vital Signs: Reviewed and stable  Last Vitals:  Filed Vitals:   05/05/14 0725  BP: 126/64  Temp:   Resp: 40    Complications: No apparent anesthesia complications

## 2014-05-05 NOTE — Anesthesia Procedure Notes (Addendum)
Procedure Name: Intubation Date/Time: 05/05/2014 7:54 AM Performed by: Gershon Mussel, Giada Schoppe Pre-anesthesia Checklist: Patient identified, Emergency Drugs available, Suction available, Patient being monitored and Timeout performed Patient Re-evaluated:Patient Re-evaluated prior to inductionOxygen Delivery Method: Circle system utilized Preoxygenation: Pre-oxygenation with 100% oxygen Intubation Type: IV induction, Rapid sequence and Cricoid Pressure applied Grade View: Grade I Tube type: Oral Number of attempts: 1 Airway Equipment and Method: Video-laryngoscopy and Stylet Placement Confirmation: ETT inserted through vocal cords under direct vision,  positive ETCO2,  CO2 detector and breath sounds checked- equal and bilateral Secured at: 23 cm Tube secured with: Tape Dental Injury: Teeth and Oropharynx as per pre-operative assessment  Difficulty Due To: Difficult Airway- due to large tongue and Difficulty was anticipated   Spinal

## 2014-05-05 NOTE — Transfer of Care (Signed)
Immediate Anesthesia Transfer of Care Note  Patient: Christopher [name]  Procedure(s) Performed: Procedure(s): SHOULDER ARTHROSCOPY LIMITED DEBRIDEMENT (Left) ROTATOR CUFF REPAIR SHOULDER OPEN (Left)  Patient Location: PACU  Anesthesia Type:General  Level of Consciousness: sedated and patient cooperative  Airway & Oxygen Therapy: Patient Spontanous Breathing and Patient connected to face mask oxygen  Post-op Assessment: Report given to PACU RN, Post -op Vital signs reviewed and stable and Patient moving all extremities  Post vital signs: Reviewed and stable  Complications: No apparent anesthesia complications

## 2014-05-05 NOTE — Brief Op Note (Addendum)
05/05/2014  9:41 AM  PATIENT:  Christopher [name]  58 y.o. male  PRE-OPERATIVE DIAGNOSIS:  left rotator cuff tear  POST-OPERATIVE DIAGNOSIS:  left rotator cuff tear  PROCEDURE:  Procedure(s): SHOULDER ARTHROSCOPY LIMITED DEBRIDEMENT (Left) ROTATOR CUFF REPAIR SHOULDER OPEN (Left)  IMPLANTS: 2 speed screw anchors from the ArthroCare kit and 1 corkscrew anchor  Operative findings partial articular surface tendon avulsion partial cuff tear Operative finding #2 severe glenohumeral arthritis with loose bodies and chondromalacia of the glenoid and humeral head, posterior labral tear degenerative type  Assisted by Simonne Maffucci   SURGEON:  Surgeon(s) and Role:    * Carole Civil, MD - Primary  ANESTHESIA:   general  EBL:     BLOOD ADMINISTERED:none  DRAINS: none   LOCAL MEDICATIONS USED:  MARCAINE    and Amount: 90 ml  SPECIMEN:  No Specimen  DISPOSITION OF SPECIMEN:  N/A  COUNTS:  YES  TOURNIQUET:  * No tourniquets in log *  DICTATION: .Dragon Dictation  PLAN OF CARE: Discharge to home after PACU  PATIENT DISPOSITION:  PACU - hemodynamically stable.   Delay start of Pharmacological VTE agent (>24hrs) due to surgical blood loss or risk of bleeding: not applicable  Procedure details Site marking and patient identification was done in the preop area. Chart update was completed. The patient was taken to the operating room for general anesthesia. In the supine position using the Schlein positioner he was placed in the modified beachchair position. Sterile prep and drape was completed.  Surgeon initiated timeout was completed.  The posterior portal was injected with dilute Marcaine with epinephrine and a small incision was made and the scope was placed into the glenohumeral joint. Excellent visualization was obtained. A diagnostic arthroscopy was completed.  The patient had multiple loose bodies in a chondral flap from the humeral head in a chondral flap on the glenoid  and a posterior labral tear, degenerative. An anterior portal was established and the shaver was placed in the anterior portal and the loose bodies were removed a chondroplasty was performed on the glenoid and humeral head. ArthroCare wand was used to perform debridement of the labrum.  A spinal needle was used to [employer] the PASTA LESION. And a PDS suture was passed and brought out of the skin. The scope was removed from the joint.  An anterior incision was made at the anterior edge of the acromion extended distally. Subcutaneous tissue was divided. The sutures which were placed workup taken to the deltoid split which was extended up to the acromion. The deltoid was not detached.  Retractors were placed. The PASTA lesion was taken down and 2 sutures from the speed screw kit were passed. This was an inverted mattress suture.  A corkscrew anchor was then placed in the medialized position at the articular margin. 2 sutures were then passed in inverted mattress fashion.  The speed screw sutures were then placed in their appropriate anchors and tied down. The medial anchors were then tied down. The anterior edge of the supraspinatus at the rotator interval was repaired with #1 Vicryl suture.  Excellent range of motion was obtained watertight closure was obtained. The undersurface of the acromion was flat and did not need any debridement or removal.  The joint and cuff area was injected with Marcaine with epinephrine. The deltoid split was closed with #2 Ethibond suture. Subcutaneous tissue was injected. 0 Monocryl was used to close the subcutaneous tissue along with staples to reapproximate the skin edges and the  portal sites were closed with interrupted 3-0 nylon simple suture  The patient was placed in an immobilizer and extubated and taken to recovery room in stable condition  Postoperative plan when I see him in the office we will institute immediate passive range of motion using Codman exercises. I  would like him to wear sling for 3 weeks. He can start resistance exercises with therapy and at 6 weeks.

## 2014-05-05 NOTE — Anesthesia Preprocedure Evaluation (Addendum)
Anesthesia Evaluation  Patient identified by MRN, date of birth, ID band Patient awake    Reviewed: Allergy & Precautions, H&P , NPO status , Patient's Chart, lab work & pertinent test results  History of Anesthesia Complications (+) PONV and history of anesthetic complications  Airway Mallampati: III TM Distance: >3 FB     Dental  (+) Edentulous Upper, Partial Lower   Pulmonary sleep apnea , Current Smoker,  breath sounds clear to auscultation        Cardiovascular Rhythm:Regular Rate:Normal     Neuro/Psych  Headaches, PSYCHIATRIC DISORDERS Anxiety Depression    GI/Hepatic GERD-  Controlled and Medicated,  Endo/Other  diabetes, Type 2, Oral Hypoglycemic Agents  Renal/GU      Musculoskeletal  (+) Arthritis -,   Abdominal   Peds  Hematology   Anesthesia Other Findings   Reproductive/Obstetrics                       Anesthesia Physical Anesthesia Plan  ASA: III  Anesthesia Plan: General   Post-op Pain Management:    Induction: Intravenous, Rapid sequence and Cricoid pressure planned  Airway Management Planned: Oral ETT and Video Laryngoscope Planned  Additional Equipment:   Intra-op Plan:   Post-operative Plan: Extubation in OR  Informed Consent: I have reviewed the patients History and Physical, chart, labs and discussed the procedure including the risks, benefits and alternatives for the proposed anesthesia with the patient or authorized representative who has indicated his/her understanding and acceptance.     Plan Discussed with:   Anesthesia Plan Comments:        Anesthesia Quick Evaluation

## 2014-05-05 NOTE — Op Note (Signed)
05/05/2014  9:41 AM  PATIENT:  Christopher [name]  58 y.o. male  PRE-OPERATIVE DIAGNOSIS:  left rotator cuff tear  POST-OPERATIVE DIAGNOSIS:  left rotator cuff tear  PROCEDURE:  Procedure(s): SHOULDER ARTHROSCOPY LIMITED DEBRIDEMENT (Left) ROTATOR CUFF REPAIR SHOULDER OPEN (Left)  IMPLANTS: 2 speed screw anchors from the ArthroCare kit and 1 corkscrew anchor  Operative findings partial articular surface tendon avulsion partial cuff tear Operative finding #2 severe glenohumeral arthritis with loose bodies and chondromalacia of the glenoid and humeral head, posterior labral tear degenerative type  Assisted by Betty Ashley   SURGEON:  Surgeon(s) and Role:    * Stanley E Harrison, MD - Primary  ANESTHESIA:   general  EBL:     BLOOD ADMINISTERED:none  DRAINS: none   LOCAL MEDICATIONS USED:  MARCAINE    and Amount: 90 ml  SPECIMEN:  No Specimen  DISPOSITION OF SPECIMEN:  N/A  COUNTS:  YES  TOURNIQUET:  * No tourniquets in log *  DICTATION: .Dragon Dictation  PLAN OF CARE: Discharge to home after PACU  PATIENT DISPOSITION:  PACU - hemodynamically stable.   Delay start of Pharmacological VTE agent (>24hrs) due to surgical blood loss or risk of bleeding: not applicable  Procedure details Site marking and patient identification was done in the preop area. Chart update was completed. The patient was taken to the operating room for general anesthesia. In the supine position using the Schlein positioner he was placed in the modified beachchair position. Sterile prep and drape was completed.  Surgeon initiated timeout was completed.  The posterior portal was injected with dilute Marcaine with epinephrine and a small incision was made and the scope was placed into the glenohumeral joint. Excellent visualization was obtained. A diagnostic arthroscopy was completed.  The patient had multiple loose bodies in a chondral flap from the humeral head in a chondral flap on the glenoid  and a posterior labral tear, degenerative. An anterior portal was established and the shaver was placed in the anterior portal and the loose bodies were removed a chondroplasty was performed on the glenoid and humeral head. ArthroCare wand was used to perform debridement of the labrum.  A spinal needle was used to Mark the PASTA LESION. And a PDS suture was passed and brought out of the skin. The scope was removed from the joint.  An anterior incision was made at the anterior edge of the acromion extended distally. Subcutaneous tissue was divided. The sutures which were placed workup taken to the deltoid split which was extended up to the acromion. The deltoid was not detached.  Retractors were placed. The PASTA lesion was taken down and 2 sutures from the speed screw kit were passed. This was an inverted mattress suture.  A corkscrew anchor was then placed in the medialized position at the articular margin. 2 sutures were then passed in inverted mattress fashion.  The speed screw sutures were then placed in their appropriate anchors and tied down. The medial anchors were then tied down. The anterior edge of the supraspinatus at the rotator interval was repaired with #1 Vicryl suture.  Excellent range of motion was obtained watertight closure was obtained. The undersurface of the acromion was flat and did not need any debridement or removal.  The joint and cuff area was injected with Marcaine with epinephrine. The deltoid split was closed with #2 Ethibond suture. Subcutaneous tissue was injected. 0 Monocryl was used to close the subcutaneous tissue along with staples to reapproximate the skin edges and the   portal sites were closed with interrupted 3-0 nylon simple suture  The patient was placed in an immobilizer and extubated and taken to recovery room in stable condition  Postoperative plan when I see him in the office we will institute immediate passive range of motion using Codman exercises. I  would like him to wear sling for 3 weeks. He can start resistance exercises with therapy and at 6 weeks.   

## 2014-05-05 NOTE — Interval H&P Note (Signed)
History and Physical Interval Note:  05/05/2014 7:23 AM  Christopher [name]  has presented today for surgery, with the diagnosis of left rotator cuff tear  The various methods of treatment have been discussed with the patient and family. After consideration of risks, benefits and other options for treatment, the patient has consented to  Procedure(s): SHOULDER ARTHROSCOPY WITH ROTATOR CUFF REPAIR (Left) as a surgical intervention .  The patient's history has been reviewed, patient examined, no change in status, stable for surgery.  I have reviewed the patient's chart and labs.  Questions were answered to the patient's satisfaction.     [name]

## 2014-05-09 ENCOUNTER — Encounter (HOSPITAL_COMMUNITY): Payer: Self-pay | Admitting: Orthopedic Surgery

## 2014-05-11 ENCOUNTER — Ambulatory Visit (INDEPENDENT_AMBULATORY_CARE_PROVIDER_SITE_OTHER): Payer: Self-pay | Admitting: Orthopedic Surgery

## 2014-05-11 VITALS — BP 139/81 | Ht 66.0 in | Wt 222.0 lb

## 2014-05-11 DIAGNOSIS — Z9889 Other specified postprocedural states: Secondary | ICD-10-CM

## 2014-05-11 DIAGNOSIS — M75102 Unspecified rotator cuff tear or rupture of left shoulder, not specified as traumatic: Secondary | ICD-10-CM

## 2014-05-11 DIAGNOSIS — S43429A Sprain of unspecified rotator cuff capsule, initial encounter: Secondary | ICD-10-CM

## 2014-05-11 NOTE — Patient Instructions (Signed)
Baby it

## 2014-05-13 NOTE — Progress Notes (Signed)
Postop visit #1  Chief Complaint  Patient presents with  . Follow-up    post op #1 Left shoulder, DOS 05/05/14    05/05/2014  9:41 AM  PATIENT:  Christopher [name]  58 y.o. male  PRE-OPERATIVE DIAGNOSIS:  left rotator cuff tear  POST-OPERATIVE DIAGNOSIS:  left rotator cuff tear  PROCEDURE:  Procedure(s): SHOULDER ARTHROSCOPY LIMITED DEBRIDEMENT (Left) ROTATOR CUFF REPAIR SHOULDER OPEN (Left)  IMPLANTS: 2 speed screw anchors from the ArthroCare kit and 1 corkscrew anchor  Operative findings partial articular surface tendon avulsion partial cuff tear Operative finding #2 severe glenohumeral arthritis with loose bodies and chondromalacia of the glenoid and humeral head, posterior labral tear degenerative type  [name] is doing well. His pain is under control now. His wound looks good. I gave him instructions on limitations and how to straighten his elbow earlier pressure keeping his arm at his side.  Recommend return in a week to have the staples taken out. We will have to go slow with [name] on this occasion although there is some concern about his glenohumeral arthritis but his repair needs to be stable before any significant strengthening exercises can be started so I think what we'll do is when his staples come out we will give him Codman exercises to do for 4 weeks.

## 2014-05-18 ENCOUNTER — Ambulatory Visit (INDEPENDENT_AMBULATORY_CARE_PROVIDER_SITE_OTHER): Payer: Self-pay | Admitting: Orthopedic Surgery

## 2014-05-18 VITALS — Ht 66.0 in | Wt 222.0 lb

## 2014-05-18 DIAGNOSIS — IMO0002 Reserved for concepts with insufficient information to code with codable children: Secondary | ICD-10-CM

## 2014-05-18 DIAGNOSIS — Z9889 Other specified postprocedural states: Secondary | ICD-10-CM

## 2014-05-18 DIAGNOSIS — M75102 Unspecified rotator cuff tear or rupture of left shoulder, not specified as traumatic: Secondary | ICD-10-CM

## 2014-05-18 DIAGNOSIS — M19019 Primary osteoarthritis, unspecified shoulder: Secondary | ICD-10-CM

## 2014-05-18 DIAGNOSIS — S43432S Superior glenoid labrum lesion of left shoulder, sequela: Secondary | ICD-10-CM

## 2014-05-18 DIAGNOSIS — S43429A Sprain of unspecified rotator cuff capsule, initial encounter: Secondary | ICD-10-CM

## 2014-05-18 MED ORDER — HYDROCODONE-ACETAMINOPHEN 10-325 MG PO TABS: 1.0000 | ORAL_TABLET | ORAL | Status: DC | PRN

## 2014-05-18 NOTE — Patient Instructions (Signed)
Sling continue

## 2014-05-18 NOTE — Progress Notes (Signed)
Chief Complaint  Patient presents with  . Follow-up    post op 2, staple removal, left shoulder, DOS 05/05/14   Open cuff repair pasta lesion arthroscopic glenohumeral joint debridement for severe arthritis  Postop day #13 staples are removed his incisions clean dry and intact his swelling is gone and his pain is under control  He is to continue using his sling and come back in 2 weeks at that time I'll reevaluate him for possible therapy with a home

## 2014-05-29 ENCOUNTER — Telehealth: Payer: Self-pay | Admitting: Orthopedic Surgery

## 2014-05-29 ENCOUNTER — Ambulatory Visit (INDEPENDENT_AMBULATORY_CARE_PROVIDER_SITE_OTHER): Payer: Self-pay | Admitting: Orthopedic Surgery

## 2014-05-29 VITALS — Ht 66.0 in | Wt 222.0 lb

## 2014-05-29 DIAGNOSIS — Z9889 Other specified postprocedural states: Secondary | ICD-10-CM

## 2014-05-29 NOTE — Progress Notes (Signed)
Staples taken out no sign of wound problem   Keep appt as scheduled

## 2014-05-29 NOTE — Telephone Encounter (Signed)
Christopher [name] was in the office on 05/18/14 to have staples removed from his shoulder after surgery, Christopher [name] is calling stating that there was one left and skin is growing over it, what do they need to do, Please advise?

## 2014-05-29 NOTE — Telephone Encounter (Signed)
Christopher [name] was told to be here today at 10:30

## 2014-05-29 NOTE — Telephone Encounter (Signed)
Patient needs to come in.

## 2014-06-05 ENCOUNTER — Other Ambulatory Visit: Payer: Self-pay | Admitting: *Deleted

## 2014-06-05 ENCOUNTER — Encounter: Payer: Self-pay | Admitting: Orthopedic Surgery

## 2014-06-05 ENCOUNTER — Ambulatory Visit (INDEPENDENT_AMBULATORY_CARE_PROVIDER_SITE_OTHER): Payer: Self-pay | Admitting: Orthopedic Surgery

## 2014-06-05 VITALS — Ht 66.0 in | Wt 222.0 lb

## 2014-06-05 DIAGNOSIS — Z9889 Other specified postprocedural states: Secondary | ICD-10-CM

## 2014-06-05 MED ORDER — HYDROCODONE-ACETAMINOPHEN 10-325 MG PO TABS: 1.0000 | ORAL_TABLET | ORAL | Status: DC | PRN

## 2014-06-05 NOTE — Progress Notes (Signed)
Postoperative visit  Status post arthroscopic debridement of glenohumeral joint for severe arthritis in an open rotator cuff repair  Patient complains of soreness is been in a sling for 4 weeks  His wound looks good he can start Codman exercises he'll followup in 4 weeks. He should not do any heavy lifting

## 2014-06-05 NOTE — Patient Instructions (Signed)
START HOME PROGRAM

## 2014-07-03 ENCOUNTER — Ambulatory Visit: Payer: Medicare Other | Admitting: Orthopedic Surgery

## 2014-07-04 ENCOUNTER — Encounter: Payer: Self-pay | Admitting: Orthopedic Surgery

## 2014-07-04 ENCOUNTER — Ambulatory Visit (INDEPENDENT_AMBULATORY_CARE_PROVIDER_SITE_OTHER): Payer: Self-pay | Admitting: Orthopedic Surgery

## 2014-07-04 VITALS — BP 148/80 | Ht 66.0 in | Wt 222.0 lb

## 2014-07-04 DIAGNOSIS — Z9889 Other specified postprocedural states: Secondary | ICD-10-CM

## 2014-07-04 DIAGNOSIS — M75102 Unspecified rotator cuff tear or rupture of left shoulder, not specified as traumatic: Secondary | ICD-10-CM

## 2014-07-04 NOTE — Progress Notes (Signed)
Patient ID: Christopher [name], male   DOB: [birth year], 58 y.o.   MRN: [id number] Chief Complaint  Patient presents with  . Follow-up    recheck Left shoulder, RCR, DOS 05/05/14    This is a postop visit from a rotator cuff repair about 2 months ago. Christopher [name] is doing well her milking him right through this. 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 He is having some pain at night and some soreness which wakes him up when he rolls over onto his left side but his passive range of motion is 150 of forward elevation in the scapular plane with 40 of external rotation with his arm at his side  Hollow up in a month start resistance band exercises internal and external rotation only

## 2014-07-13 ENCOUNTER — Encounter (INDEPENDENT_AMBULATORY_CARE_PROVIDER_SITE_OTHER): Payer: Self-pay | Admitting: *Deleted

## 2014-08-03 ENCOUNTER — Ambulatory Visit (INDEPENDENT_AMBULATORY_CARE_PROVIDER_SITE_OTHER): Payer: Self-pay | Admitting: Orthopedic Surgery

## 2014-08-03 ENCOUNTER — Other Ambulatory Visit (INDEPENDENT_AMBULATORY_CARE_PROVIDER_SITE_OTHER): Payer: Self-pay | Admitting: *Deleted

## 2014-08-03 ENCOUNTER — Encounter (HOSPITAL_COMMUNITY): Payer: Self-pay | Admitting: Orthopedic Surgery

## 2014-08-03 VITALS — BP 141/81 | Ht 66.0 in | Wt 222.0 lb

## 2014-08-03 DIAGNOSIS — Z9889 Other specified postprocedural states: Secondary | ICD-10-CM

## 2014-08-03 DIAGNOSIS — Z86010 Personal history of colonic polyps: Secondary | ICD-10-CM

## 2014-08-03 MED ORDER — HYDROCODONE-ACETAMINOPHEN 10-325 MG PO TABS: 1.0000 | ORAL_TABLET | ORAL | Status: DC | PRN

## 2014-08-03 NOTE — Progress Notes (Signed)
Patient ID: Christopher [name], male   DOB: Oct 21, 1955, 58 y.o.   MRN: [id number] Chief Complaint  Patient presents with  . Follow-up    1 month follow up left shoulder, 3 months postop, dos 05/05/14    Open rotator cuff repair of a possible lesion arthroscopic glenohumeral joint debridement for severe arthritis  Doing well except for aching he has regained 130 of forward elevation  Continue with ibuprofen twice a day 3 month follow-up

## 2014-09-05 ENCOUNTER — Telehealth (INDEPENDENT_AMBULATORY_CARE_PROVIDER_SITE_OTHER): Payer: Self-pay | Admitting: *Deleted

## 2014-09-05 DIAGNOSIS — Z1211 Encounter for screening for malignant neoplasm of colon: Secondary | ICD-10-CM

## 2014-09-05 NOTE — Telephone Encounter (Signed)
Referring MD/PCP: hwakins   Procedure: tcs  Reason/Indication:  Hx polyps  Has patient had this procedure before?  Yes, 2012 -- epic  If so, when, by whom and where?    Is there a family history of colon cancer?  no  Who?  What age when diagnosed?    Is patient diabetic?   no      Does patient have prosthetic heart valve?  no  Do you have a pacemaker?  no  Has patient ever had endocarditis? no  Has patient had joint replacement within last 12 months?  no  Does patient tend to be constipated or take laxatives? no  Is patient on Coumadin, Plavix and/or Aspirin? no  Medications: see epic  Allergies: see epic  Medication Adjustment:   Procedure date & time: 10/05/14 at 930

## 2014-09-05 NOTE — Telephone Encounter (Signed)
Patient needs movi prep 

## 2014-09-08 MED ORDER — PEG-KCL-NACL-NASULF-NA ASC-C 100 G PO SOLR: 1.0000 | Freq: Once | ORAL | Status: DC

## 2014-09-08 NOTE — Telephone Encounter (Signed)
agree

## 2014-10-05 ENCOUNTER — Ambulatory Visit (HOSPITAL_COMMUNITY)
Admission: RE | Admit: 2014-10-05 | Discharge: 2014-10-05 | Disposition: A | Discharge status: Home / Self Care | Payer: PPO | Source: Ambulatory Visit | Attending: Internal Medicine | Admitting: Internal Medicine

## 2014-10-05 ENCOUNTER — Encounter (HOSPITAL_COMMUNITY)
Admission: RE | Disposition: A | Discharge status: Home / Self Care | Payer: Self-pay | Source: Ambulatory Visit | Attending: Internal Medicine

## 2014-10-05 ENCOUNTER — Encounter (HOSPITAL_COMMUNITY): Payer: Self-pay | Admitting: *Deleted

## 2014-10-05 DIAGNOSIS — Z881 Allergy status to other antibiotic agents status: Secondary | ICD-10-CM | POA: Insufficient documentation

## 2014-10-05 DIAGNOSIS — Z888 Allergy status to other drugs, medicaments and biological substances status: Secondary | ICD-10-CM | POA: Diagnosis not present

## 2014-10-05 DIAGNOSIS — Z886 Allergy status to analgesic agent status: Secondary | ICD-10-CM | POA: Diagnosis not present

## 2014-10-05 DIAGNOSIS — D120 Benign neoplasm of cecum: Secondary | ICD-10-CM | POA: Insufficient documentation

## 2014-10-05 DIAGNOSIS — D123 Benign neoplasm of transverse colon: Secondary | ICD-10-CM | POA: Insufficient documentation

## 2014-10-05 DIAGNOSIS — F17210 Nicotine dependence, cigarettes, uncomplicated: Secondary | ICD-10-CM | POA: Diagnosis not present

## 2014-10-05 DIAGNOSIS — K219 Gastro-esophageal reflux disease without esophagitis: Secondary | ICD-10-CM | POA: Insufficient documentation

## 2014-10-05 DIAGNOSIS — D122 Benign neoplasm of ascending colon: Secondary | ICD-10-CM | POA: Insufficient documentation

## 2014-10-05 DIAGNOSIS — E119 Type 2 diabetes mellitus without complications: Secondary | ICD-10-CM | POA: Insufficient documentation

## 2014-10-05 DIAGNOSIS — R1032 Left lower quadrant pain: Secondary | ICD-10-CM | POA: Diagnosis not present

## 2014-10-05 DIAGNOSIS — G4730 Sleep apnea, unspecified: Secondary | ICD-10-CM | POA: Insufficient documentation

## 2014-10-05 DIAGNOSIS — Z86010 Personal history of colonic polyps: Secondary | ICD-10-CM | POA: Diagnosis not present

## 2014-10-05 DIAGNOSIS — R197 Diarrhea, unspecified: Secondary | ICD-10-CM | POA: Diagnosis present

## 2014-10-05 DIAGNOSIS — E785 Hyperlipidemia, unspecified: Secondary | ICD-10-CM | POA: Diagnosis not present

## 2014-10-05 DIAGNOSIS — F419 Anxiety disorder, unspecified: Secondary | ICD-10-CM | POA: Diagnosis not present

## 2014-10-05 DIAGNOSIS — K644 Residual hemorrhoidal skin tags: Secondary | ICD-10-CM | POA: Insufficient documentation

## 2014-10-05 DIAGNOSIS — F329 Major depressive disorder, single episode, unspecified: Secondary | ICD-10-CM | POA: Insufficient documentation

## 2014-10-05 HISTORY — PX: COLONOSCOPY: SHX5424

## 2014-10-05 LAB — GLUCOSE, CAPILLARY: GLUCOSE-CAPILLARY: 102 mg/dL — AB (ref 70–99)

## 2014-10-05 SURGERY — COLONOSCOPY
Anesthesia: Moderate Sedation

## 2014-10-05 MED ORDER — MEPERIDINE HCL 50 MG/ML IJ SOLN
INTRAMUSCULAR | Status: AC
  Filled 2014-10-05: qty 1

## 2014-10-05 MED ORDER — HYOSCYAMINE SULFATE ER 0.375 MG PO TB12: 0.3750 mg | ORAL_TABLET | Freq: Every day | ORAL | Status: DC

## 2014-10-05 MED ORDER — DICYCLOMINE HCL 10 MG PO CAPS: 10.0000 mg | ORAL_CAPSULE | Freq: Three times a day (TID) | ORAL | Status: DC

## 2014-10-05 MED ORDER — SODIUM CHLORIDE 0.9 % IV SOLN
INTRAVENOUS | Status: DC
  Administered 2014-10-05: 09:00:00 via INTRAVENOUS

## 2014-10-05 MED ORDER — MIDAZOLAM HCL 5 MG/5ML IJ SOLN
INTRAMUSCULAR | Status: AC
  Filled 2014-10-05: qty 20

## 2014-10-05 MED ORDER — STERILE WATER FOR IRRIGATION IR SOLN
Status: DC | PRN
  Administered 2014-10-05: 10:00:00

## 2014-10-05 MED ORDER — MEPERIDINE HCL 50 MG/ML IJ SOLN
INTRAMUSCULAR | Status: DC | PRN
  Administered 2014-10-05 (×2): 25 mg via INTRAVENOUS

## 2014-10-05 MED ORDER — MIDAZOLAM HCL 5 MG/5ML IJ SOLN
INTRAMUSCULAR | Status: DC | PRN
  Administered 2014-10-05: 3 mg via INTRAVENOUS
  Administered 2014-10-05: 2 mg via INTRAVENOUS
  Administered 2014-10-05: 3 mg via INTRAVENOUS
  Administered 2014-10-05: 2 mg via INTRAVENOUS

## 2014-10-05 NOTE — H&P (Signed)
Christopher [name] is an 59 y.o. male.   Chief Complaint: Patient is here for colonoscopy. HPI: Patient is 5 old Caucasian male was history of colonic adenomas and is here for surveillance colonoscopy. He had single adenoma removed from his first colonoscopy 7 years ago. Last colonoscopy was in October 2012 with removal of 3 tubular adenomas but his prep was not good. He was therefore advice to return in 3 years. He complains of intermittent postprandial diarrhea and LLQ abdominal pain. He denies rectal bleeding. Family history is negative for CRC.  Past Medical History  Diagnosis Date  . Hyperlipidemia   . Anxiety   . Depression   . GERD (gastroesophageal reflux disease)   . Sleep apnea     uses CIPAP machine at night  . Heart murmur     Years ago- not now  . Headache(784.0)     after surgery  . Complication of anesthesia     pt had a hard time being able to move after spinal anesthesia , 3-4 hours  . PONV (postoperative nausea and vomiting)   . Arthritis   . Diabetes mellitus without complication     pt sts borderline    Past Surgical History  Procedure Laterality Date  . Knee arthroscopy      left knee  . Knee arthroscopy      right knee   . Spinal fusion      x 2  . Neck fusion    . Colonoscopy  06/27/2011    Procedure: COLONOSCOPY;  Surgeon: Rogene Houston, MD;  [facility];  Service: Endoscopy;  Laterality: N/A;  9:00 / Pt to be here at 9am for 10:45 procedure, benign polyps removed  . Hernia repair      umbilical hernia  . Cardiac catheterization    . Chondroplasty  08/15/2011    Procedure: CHONDROPLASTY;  Surgeon: [name], MD;  Location: [facility];  Service: Orthopedics;  Laterality: Left;  . Lumbar laminectomy/decompression microdiscectomy  09/14/2012    Procedure: LUMBAR LAMINECTOMY/DECOMPRESSION MICRODISCECTOMY 1 LEVEL;  Surgeon: Floyce Stakes, MD;  Location: [facility];  Service: Neurosurgery;  Laterality: Right;  Right Lumbar three-four  Diskectomy  . Shoulder surgery Right     Open Mumford procedure  . Appendectomy    . Back surgery      neck and back fusion  . Shoulder arthroscopy with rotator cuff repair Left 05/05/2014    Procedure: SHOULDER ARTHROSCOPY LIMITED DEBRIDEMENT;  Surgeon: Carole Civil, MD;  Location: [facility];  Service: Orthopedics;  Laterality: Left;  . Shoulder open rotator cuff repair Left 05/05/2014    Procedure: ROTATOR CUFF REPAIR SHOULDER OPEN;  Surgeon: Carole Civil, MD;  Location: [facility];  Service: Orthopedics;  Laterality: Left;  . Shoulder arthroscopy with bicepstenotomy Left 09/23/2013    Procedure: SHOULDER ARTHROSCOPY WITH BICEPSTENOTOMY AND EXTENSIVE DEBRIDEMENT;  Surgeon: Carole Civil, MD;  Location: [facility];  Service: Orthopedics;  Laterality: Left;    Family History  Problem Relation Age of Onset  . Diabetes    . Lung disease    . Arthritis    . Anesthesia problems Neg Hx   . Hypotension Neg Hx   . Malignant hyperthermia Neg Hx   . Pseudochol deficiency Neg Hx    Social History:  reports that he has been smoking Cigarettes.  He has a 40 pack-year smoking history. He does not have any smokeless tobacco history on file. He reports that he does not drink  alcohol or use illicit drugs.  Allergies:  Allergies  Allergen Reactions  . Celebrex [Celecoxib] Itching and Swelling    All over  . Codeine Nausea And Vomiting    Extreme stomach pain. This includes anything with the derivative of codeine in it.  . Doxycycline Swelling    Made tongue turn black   . Cortisone Swelling  . Oxycodone-Acetaminophen Itching    Can tolerate with benadryl   . Prednisone Swelling  . Relafen [Nabumetone] Swelling    Medications Prior to Admission  Medication Sig Dispense Refill  . ALPRAZolam (XANAX) 0.5 MG tablet Take 0.5 mg by mouth 2 (two) times daily.     Marland Kitchen HYDROcodone-acetaminophen (NORCO) 10-325 MG per tablet Take 1 tablet by mouth every 4 (four) hours as needed. 180 tablet 0  .  ibuprofen (ADVIL,MOTRIN) 800 MG tablet Take 800 mg by mouth daily as needed for moderate pain.    . pantoprazole (PROTONIX) 40 MG tablet Take 40 mg by mouth daily.     . peg 3350 powder (MOVIPREP) 100 G SOLR Take 1 kit (200 g total) by mouth once. 1 kit 0  . SEROQUEL 50 MG tablet Take 50 mg by mouth at bedtime.     . sertraline (ZOLOFT) 50 MG tablet Take 50 mg by mouth daily.     . temazepam (RESTORIL) 30 MG capsule Take 30 mg by mouth at bedtime.     . diphenhydrAMINE (BENADRYL) 25 MG tablet Take 1 tablet (25 mg total) by mouth every 6 (six) hours as needed. (Patient not taking: Reported on 08/03/2014) 60 tablet 5  . HYDROmorphone (DILAUDID) 4 MG tablet Take 1 tablet (4 mg total) by mouth every 4 (four) hours as needed for severe pain. (Patient not taking: Reported on 08/03/2014) 40 tablet 0    Results for orders placed or performed during the hospital encounter of 10/05/14 (from the past 48 hour(s))  Glucose, capillary     Status: Abnormal   Collection Time: 10/05/14  8:44 AM  Result Value Ref Range   Glucose-Capillary 102 (H) 70 - 99 mg/dL   No results found.  ROS  Blood pressure 125/70, pulse 68, temperature 97.4 F (36.3 C), temperature source Oral, resp. rate 19, height $RemoveBe'5\' 6"'TorrmPPRs$  (1.676 m), weight 200 lb (90.719 kg), SpO2 99 %. Physical Exam  Constitutional: He appears well-developed and well-nourished.  HENT:  Mouth/Throat: Oropharynx is clear and moist.  Eyes: Conjunctivae are normal. No scleral icterus.  Neck: No thyromegaly present.  Cardiovascular: Normal rate, regular rhythm and normal heart sounds.   No murmur heard. Respiratory: Effort normal and breath sounds normal.  GI: Soft. He exhibits no distension and no mass. There is no rebound (mild tenderness at LLQ).  Musculoskeletal: He exhibits no edema.  Lymphadenopathy:    He has no cervical adenopathy.  Neurological: He is alert.  Skin: Skin is warm.     Assessment/Plan History of colonic adenomas. Surveillance  colonoscopy.  [name] U 10/05/2014, 9:34 AM

## 2014-10-05 NOTE — Op Note (Signed)
COLONOSCOPY PROCEDURE REPORT  PATIENT:  Christopher [name]  MR#:  [id number] Birthdate:  [birth year], 59 y.o., male Endoscopist:  Dr. Rogene Houston, MD Referred By:  Dr. Alonza Bogus, MD  Procedure Date: 10/05/2014  Procedure:   Colonoscopy  Indications:  Patient is 4 old Caucasian male with history of colonic adenomas. His last colonoscopy was in October 2012 with removal of 3 tubular adenomas. His prep was suboptimal and he was here for advice to return in 3 years. Presently he has no GI symptoms other than postprandial abdominal pain and diarrhea.  Informed Consent:  The procedure and risks were reviewed with the patient and informed consent was obtained.  Medications:  Demerol 50 mg IV Versed 10 mg IV  Description of procedure:  After a digital rectal exam was performed, that colonoscope was advanced from the anus through the rectum and colon to the area of the cecum, ileocecal valve and appendiceal orifice. The cecum was deeply intubated. These structures were well-seen and photographed for the record. From the level of the cecum and ileocecal valve, the scope was slowly and cautiously withdrawn. The mucosal surfaces were carefully surveyed utilizing scope tip to flexion to facilitate fold flattening as needed. The scope was pulled down into the rectum where a thorough exam including retroflexion was performed.  Findings:  . Prep satisfactory. Small cecal polyp was cold snared. It was located next to appendiceal orifice. Two polyps were cold snared from ascending colon. One polyp was bigger than initially assessed. Residual polyp was ablated with argon plasma coagulator. 7 mm polyp hot snared from ascending colon. Another small polyp from ascending colon was removed while cold biopsy. Three small polyps cold snared from hepatic flexure. Two small polyps cold snared from transverse colon. Normal rectal mucosa. Small hemorrhoids below the dentate line.   Therapeutic/Diagnostic  Maneuvers Performed:  See above  Complications:  None  Cecal Withdrawal Time:  30 minutes  Impression:  Examination performed to cecum. Patient had ten polyps removed ranging in size from 4-7 mm.    Eight polyps were cold snared; one was ablated via cold biopsy and one polyp was hot        snared from hepatic flexure( 7 mm).     Eight of these polyps were submitted together into from transverse colon were placed in           separate container  Small external hemorrhoids  Recommendations:  Standard instructions given. Dicyclomine 10 mg by mouth before meals for IBS symptoms I will contact patient with biopsy results and further recommendations.  Journee Bobrowski U  10/05/2014 10:31 AM  CC: Dr. Alonza Bogus, MD & Dr. Rayne Du ref. provider found

## 2014-10-05 NOTE — Discharge Instructions (Signed)
No aspirin or NSAIDs for 1 week. Resume other medications and usual diet. Dicyclomine 10 mg by mouth 30 minutes before each meal 3 times a day. No driving for 24 hours. Physician will call with biopsy results.  Colonoscopy, Care After Refer to this sheet in the next few weeks. These instructions provide you with information on caring for yourself after your procedure. Your health care provider may also give you more specific instructions. Your treatment has been planned according to current medical practices, but problems sometimes occur. Call your health care provider if you have any problems or questions after your procedure. WHAT TO EXPECT AFTER THE PROCEDURE  After your procedure, it is typical to have the following:  A small amount of blood in your stool.  Moderate amounts of gas and mild abdominal cramping or bloating. HOME CARE INSTRUCTIONS  Do not drive, operate machinery, or sign important documents for 24 hours.  You may shower and resume your regular physical activities, but move at a slower pace for the first 24 hours.  Take frequent rest periods for the first 24 hours.  Walk around or put a warm pack on your abdomen to help reduce abdominal cramping and bloating.  Drink enough fluids to keep your urine clear or pale yellow.  You may resume your normal diet as instructed by your health care provider. Avoid heavy or fried foods that are hard to digest.  Avoid drinking alcohol for 24 hours or as instructed by your health care provider.  Only take over-the-counter or prescription medicines as directed by your health care provider.  If a tissue sample (biopsy) was taken during your procedure:  Do not take aspirin or blood thinners for 7 days, or as instructed by your health care provider.  Do not drink alcohol for 7 days, or as instructed by your health care provider.  Eat soft foods for the first 24 hours. SEEK MEDICAL CARE IF: You have persistent spotting of blood in  your stool 2-3 days after the procedure. SEEK IMMEDIATE MEDICAL CARE IF:  You have more than a small spotting of blood in your stool.  You pass large blood clots in your stool.  Your abdomen is swollen (distended).  You have nausea or vomiting.  You have a fever.  You have increasing abdominal pain that is not relieved with medicine. Document Released: 04/01/2004 Document Revised: 06/08/2013 Document Reviewed: 04/25/2013 [facility] Patient Information 2015 [place], [place]. This information is not intended to replace advice given to you by your health care provider. Make sure you discuss any questions you have with your health care provider. High-Fiber Diet Fiber is found in fruits, vegetables, and grains. A high-fiber diet encourages the addition of more whole grains, legumes, fruits, and vegetables in your diet. The recommended amount of fiber for adult males is 38 g per day. For adult females, it is 25 g per day. Pregnant and lactating women should get 28 g of fiber per day. If you have a digestive or bowel problem, ask your caregiver for advice before adding high-fiber foods to your diet. Eat a variety of high-fiber foods instead of only a select few type of foods.  PURPOSE  To increase stool bulk.  To make bowel movements more regular to prevent constipation.  To lower cholesterol.  To prevent overeating. WHEN IS THIS DIET USED?  It may be used if you have constipation and hemorrhoids.  It may be used if you have uncomplicated diverticulosis (intestine condition) and irritable bowel syndrome.  It  may be used if you need help with weight management.  It may be used if you want to add it to your diet as a protective measure against atherosclerosis, diabetes, and cancer. SOURCES OF FIBER  Whole-grain breads and cereals.  Fruits, such as apples, oranges, bananas, berries, prunes, and pears.  Vegetables, such as green peas, carrots, sweet potatoes, beets, broccoli, cabbage,  spinach, and artichokes.  Legumes, such split peas, soy, lentils.  Almonds. FIBER CONTENT IN FOODS Starches and Grains / Dietary Fiber (g)  Cheerios, 1 cup / 3 g  Corn Flakes cereal, 1 cup / 0.7 g  Rice crispy treat cereal, 1 cup / 0.3 g  Instant oatmeal (cooked),  cup / 2 g  Frosted wheat cereal, 1 cup / 5.1 g  Brown, long-grain rice (cooked), 1 cup / 3.5 g  White, long-grain rice (cooked), 1 cup / 0.6 g  Enriched macaroni (cooked), 1 cup / 2.5 g Legumes / Dietary Fiber (g)  Baked beans (canned, plain, or vegetarian),  cup / 5.2 g  Kidney beans (canned),  cup / 6.8 g  Pinto beans (cooked),  cup / 5.5 g Breads and Crackers / Dietary Fiber (g)  Plain or honey graham crackers, 2 squares / 0.7 g  Saltine crackers, 3 squares / 0.3 g  Plain, salted pretzels, 10 pieces / 1.8 g  Whole-wheat bread, 1 slice / 1.9 g  White bread, 1 slice / 0.7 g  Raisin bread, 1 slice / 1.2 g  Plain bagel, 3 oz / 2 g  Flour tortilla, 1 oz / 0.9 g  Corn tortilla, 1 small / 1.5 g  Hamburger or hotdog bun, 1 small / 0.9 g Fruits / Dietary Fiber (g)  Apple with skin, 1 medium / 4.4 g  Sweetened applesauce,  cup / 1.5 g  Banana,  medium / 1.5 g  Grapes, 10 grapes / 0.4 g  Orange, 1 small / 2.3 g  Raisin, 1.5 oz / 1.6 g  Melon, 1 cup / 1.4 g Vegetables / Dietary Fiber (g)  Green beans (canned),  cup / 1.3 g  Carrots (cooked),  cup / 2.3 g  Broccoli (cooked),  cup / 2.8 g  Peas (cooked),  cup / 4.4 g  Mashed potatoes,  cup / 1.6 g  Lettuce, 1 cup / 0.5 g  Corn (canned),  cup / 1.6 g  Tomato,  cup / 1.1 g Document Released: 08/18/2005 Document Revised: 02/17/2012 Document Reviewed: 11/20/2011 [employer] Patient Information 2015 [place], [place]. This information is not intended to replace advice given to you by your health care provider. Make sure you discuss any questions you have with your health care provider.

## 2014-10-06 ENCOUNTER — Encounter (HOSPITAL_COMMUNITY): Payer: Self-pay | Admitting: Internal Medicine

## 2014-10-11 ENCOUNTER — Encounter (INDEPENDENT_AMBULATORY_CARE_PROVIDER_SITE_OTHER): Payer: Self-pay | Admitting: *Deleted

## 2014-10-23 ENCOUNTER — Telehealth (INDEPENDENT_AMBULATORY_CARE_PROVIDER_SITE_OTHER): Payer: Self-pay | Admitting: *Deleted

## 2014-10-23 NOTE — Telephone Encounter (Signed)
Increase dicyclomine to 20 mg before each meal; Can call in a new prescription for 1 month if he needs it. Patient needs office visit next week

## 2014-10-23 NOTE — Telephone Encounter (Signed)
Patient's wife called and given Dr.Rehman's instruction. She states that Dr.Rehman had instructed the patient to stop the Dicyclomine 10 mg after his Colonoscopy as it was causing abdominal cramping instead of calming his stomach.  She was advised that this would be addressed with Dr.Rehman and a call back would be prior to days end or in the morning , with his recommendation.

## 2014-10-23 NOTE — Telephone Encounter (Signed)
Per Dr.Rehman  - lets try  Levbid 0.125 mg take 1 by mouth every morning. Patient will be called 10/24/14.

## 2014-10-23 NOTE — Telephone Encounter (Signed)
Patient's wife called, patient still C/O diarrhea and severe cramping, he has been on the probiotics for 2 weeks as your suggested, everything he eats solid & liquid goes straight through him, he has not had a fever -- please advise what the next step should be

## 2014-10-25 NOTE — Telephone Encounter (Signed)
Patient's wife has indicated that the Levbid is not on formulary and is to expensive. Will check again to make sure.

## 2014-10-26 ENCOUNTER — Telehealth (INDEPENDENT_AMBULATORY_CARE_PROVIDER_SITE_OTHER): Payer: Self-pay | Admitting: *Deleted

## 2014-10-26 NOTE — Telephone Encounter (Signed)
I have called patient back. Need to find out what medicine and more about his diarrhea.

## 2014-10-26 NOTE — Telephone Encounter (Signed)
Refer to a second telephone message that has been opened.

## 2014-10-26 NOTE — Telephone Encounter (Signed)
Progress Report - Wife called and said Christopher [name] got his Rx for $40 but money is very limited for them. His diarrhea has stopped a little.

## 2014-10-27 NOTE — Telephone Encounter (Signed)
I have not heard from the patient's wife. No answer at the number provided.

## 2014-11-02 ENCOUNTER — Telehealth (INDEPENDENT_AMBULATORY_CARE_PROVIDER_SITE_OTHER): Payer: Self-pay | Admitting: *Deleted

## 2014-11-02 ENCOUNTER — Ambulatory Visit: Payer: Medicare Other | Admitting: Orthopedic Surgery

## 2014-11-02 NOTE — Telephone Encounter (Signed)
Christopher [name] came by the office today thinking he had an apt with Dr. Laural Golden. He would like for him to know his diarrhea has stopped and the probiotics seems to be helping him. Should he  taking them and longer than one month. His return phone number is [phone].

## 2014-11-03 NOTE — Telephone Encounter (Signed)
Progress Report forwarded to Dr.Rehman

## 2014-11-06 ENCOUNTER — Ambulatory Visit (INDEPENDENT_AMBULATORY_CARE_PROVIDER_SITE_OTHER): Payer: PPO | Admitting: Orthopedic Surgery

## 2014-11-06 ENCOUNTER — Encounter: Payer: Self-pay | Admitting: Orthopedic Surgery

## 2014-11-06 ENCOUNTER — Ambulatory Visit: Payer: PPO | Admitting: Orthopedic Surgery

## 2014-11-06 VITALS — BP 143/77 | Ht 66.0 in | Wt 200.0 lb

## 2014-11-06 DIAGNOSIS — S46112S Strain of muscle, fascia and tendon of long head of biceps, left arm, sequela: Secondary | ICD-10-CM

## 2014-11-06 DIAGNOSIS — Z9889 Other specified postprocedural states: Secondary | ICD-10-CM

## 2014-11-06 MED ORDER — HYDROCODONE-ACETAMINOPHEN 10-325 MG PO TABS: 1.0000 | ORAL_TABLET | ORAL | Status: DC | PRN

## 2014-11-06 NOTE — Progress Notes (Signed)
Patient ID: Christopher [name], male   DOB: October 10, 1955, 59 y.o.   MRN: [id number]  Chief Complaint  Patient presents with  . Follow-up    3 month recheck left shoulder rcr, DOS 05/05/14    HPI Christopher [name] is a 59 y.o. male.  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 He also picked up some heavy bags of side or mulch  He has some night pain and difficulty reaching the arm across the chest  He does well with ibuprofen 800 when he takes it  System review he's had some difficulties with his GI tract including diarrhea and is scheduled for more procedures for workup  Left shoulder tender over the anterior shoulder joint line over well-healed incision right at its proximal aspect of his shoulder flexion is limited 125. Cuff strength is normal. Passive range of motion is painful. Point tenderness in the area stated shoulder stable skin incision normal pulses good sensation normal. Vital signs stable.   Past Medical History  Diagnosis Date  . Hyperlipidemia   . Anxiety   . Depression   . GERD (gastroesophageal reflux disease)   . Sleep apnea     uses CIPAP machine at night  . Heart murmur     Years ago- not now  . Headache(784.0)     after surgery  . Complication of anesthesia     pt had a hard time being able to move after spinal anesthesia , 3-4 hours  . PONV (postoperative nausea and vomiting)   . Arthritis   . Diabetes mellitus without complication     pt sts borderline    Past Surgical History  Procedure Laterality Date  . Knee arthroscopy      left knee  . Knee arthroscopy      right knee   . Spinal fusion      x 2  . Neck fusion    . Colonoscopy  06/27/2011    Procedure: COLONOSCOPY;  Surgeon: Rogene Houston, MD;  [facility];  Service:  Endoscopy;  Laterality: N/A;  9:00 / Pt to be here at 9am for 10:45 procedure, benign polyps removed  . Hernia repair      umbilical hernia  . Cardiac catheterization    . Chondroplasty  08/15/2011    Procedure: CHONDROPLASTY;  Surgeon: [name], MD;  Location: [facility];  Service: Orthopedics;  Laterality: Left;  . Lumbar laminectomy/decompression microdiscectomy  09/14/2012    Procedure: LUMBAR LAMINECTOMY/DECOMPRESSION MICRODISCECTOMY 1 LEVEL;  Surgeon: Floyce Stakes, MD;  Location: [facility];  Service: Neurosurgery;  Laterality: Right;  Right Lumbar three-four Diskectomy  . Shoulder surgery Right     Open Mumford procedure  . Appendectomy    . Back surgery      neck and back fusion  . Shoulder arthroscopy with rotator cuff repair Left 05/05/2014    Procedure: SHOULDER ARTHROSCOPY LIMITED DEBRIDEMENT;  Surgeon: Carole Civil, MD;  Location: [facility];  Service: Orthopedics;  Laterality: Left;  . Shoulder open rotator cuff repair Left 05/05/2014    Procedure: ROTATOR CUFF REPAIR SHOULDER OPEN;  Surgeon: Carole Civil, MD;  Location: [facility];  Service: Orthopedics;  Laterality: Left;  . Shoulder arthroscopy with bicepstenotomy Left 09/23/2013    Procedure: SHOULDER ARTHROSCOPY WITH  BICEPSTENOTOMY AND EXTENSIVE DEBRIDEMENT;  Surgeon: Carole Civil, MD;  Location: [facility];  Service: Orthopedics;  Laterality: Left;  . Colonoscopy N/A 10/05/2014    Procedure: COLONOSCOPY;  Surgeon: Rogene Houston, MD;  [facility];  Service: Endoscopy;  Laterality: N/A;  930     Allergies  Allergen Reactions  . Celebrex [Celecoxib] Itching and Swelling    All over  . Codeine Nausea And Vomiting    Extreme stomach pain. This includes anything with the derivative of codeine in it.  . Doxycycline Swelling    Made tongue turn black   . Cortisone Swelling  . Oxycodone-Acetaminophen Itching    Can tolerate with benadryl   . Prednisone Swelling  . Relafen [Nabumetone] Swelling     Current Outpatient Prescriptions  Medication Sig Dispense Refill  . ALPRAZolam (XANAX) 0.5 MG tablet Take 0.5 mg by mouth 2 (two) times daily.     Marland Kitchen dicyclomine (BENTYL) 10 MG capsule Take 1 capsule (10 mg total) by mouth 3 (three) times daily before meals. 90 capsule 2  . HYDROcodone-acetaminophen (NORCO) 10-325 MG per tablet Take 1 tablet by mouth every 4 (four) hours as needed. 180 tablet 0  . pantoprazole (PROTONIX) 40 MG tablet Take 40 mg by mouth daily.     . SEROQUEL 50 MG tablet Take 50 mg by mouth at bedtime.     . sertraline (ZOLOFT) 50 MG tablet Take 50 mg by mouth daily.     . temazepam (RESTORIL) 30 MG capsule Take 30 mg by mouth at bedtime.      No current facility-administered medications for this visit.    Review of Systems Review of Systems   Physical Exam Blood pressure 143/77, height 5\' 6"  (1.676 m), weight 200 lb (90.719 kg).  Physical Exam  Data Reviewed   Assessment    Encounter Diagnoses  Name Primary?  . S/P shoulder surgery Yes  . Biceps tendon tear, left, sequela         Plan    Inject biceps area  Procedure note Verbal consent was given timeout was confirmed to confirm the site of procedure We injected Depo-Medrol 40 lidocaine 1% 3 mL we used alcohol and ethyl chloride prep  No complications       [name] 11/06/2014, 4:18 PM

## 2014-11-10 NOTE — Telephone Encounter (Signed)
Call returned. Message left on answering service. He can start using dicyclomine on as-needed basis.

## 2015-02-06 ENCOUNTER — Ambulatory Visit (INDEPENDENT_AMBULATORY_CARE_PROVIDER_SITE_OTHER): Payer: PPO | Admitting: Orthopedic Surgery

## 2015-02-06 ENCOUNTER — Encounter: Payer: Self-pay | Admitting: Orthopedic Surgery

## 2015-02-06 VITALS — BP 133/76 | Ht 66.0 in | Wt 232.0 lb

## 2015-02-06 DIAGNOSIS — Z9889 Other specified postprocedural states: Secondary | ICD-10-CM

## 2015-02-06 MED ORDER — HYDROCODONE-ACETAMINOPHEN 10-325 MG PO TABS: 1.0000 | ORAL_TABLET | ORAL | Status: DC | PRN

## 2015-02-06 MED ORDER — IBUPROFEN 800 MG PO TABS: 800.0000 mg | ORAL_TABLET | Freq: Every day | ORAL | Status: DC | PRN

## 2015-02-06 NOTE — Progress Notes (Signed)
Patient ID: Christopher [name], male   DOB: 1956-02-05, 59 y.o.   MRN: [id number] Chief Complaint  Patient presents with  . Follow-up    3 month recheck left shoulder RCR, DOS 05/05/14    Follow-up visit for Christopher [name]. He had surgery September 2015 on his left rotator cuff he had a debridement arthroscopic evaluation and debridement of the joint  He's doing well he knows his limitations he has some occasional pain depending on how active he is any has some night symptoms  He takes ibuprofen and 1-2 hydrocodone a day for his shoulder as well as his chronic back pain  Review of systems is normal for radicular symptoms in that left arm  BP 133/76 mmHg  Ht 5\' 6"  (1.676 m)  Wt 232 lb (105.235 kg)  BMI 37.46 kg/m2 Normal appearance grooming and hygiene oriented 3 mood affect normal  His exam shows weakness in his rotator cuff but he has full forward elevation his weaknesses 5 minus/5  He has no tenderness  Impingement sign negative mild crepitance  Follow-up 3 months for the one year follow-up status post left rotator cuff surgery    Prescriptions today Meds ordered this encounter  Medications  . HYDROcodone-acetaminophen (NORCO) 10-325 MG per tablet    Sig: Take 1 tablet by mouth every 4 (four) hours as needed.    Dispense:  180 tablet    Refill:  0  . ibuprofen (ADVIL,MOTRIN) 800 MG tablet    Sig: Take 1 tablet (800 mg total) by mouth daily as needed for moderate pain.    Dispense:  30 tablet    Refill:  3

## 2015-03-08 ENCOUNTER — Ambulatory Visit (INDEPENDENT_AMBULATORY_CARE_PROVIDER_SITE_OTHER): Payer: PPO | Admitting: Orthopedic Surgery

## 2015-03-08 ENCOUNTER — Encounter: Payer: Self-pay | Admitting: Orthopedic Surgery

## 2015-03-08 VITALS — BP 144/78 | Ht 66.0 in | Wt 232.0 lb

## 2015-03-08 DIAGNOSIS — M129 Arthropathy, unspecified: Secondary | ICD-10-CM | POA: Diagnosis not present

## 2015-03-08 DIAGNOSIS — M7552 Bursitis of left shoulder: Secondary | ICD-10-CM | POA: Diagnosis not present

## 2015-03-08 DIAGNOSIS — M19019 Primary osteoarthritis, unspecified shoulder: Secondary | ICD-10-CM

## 2015-03-08 NOTE — Patient Instructions (Addendum)
Ok to cancel previously scheduled visit    Joint Injection Care After Refer to this sheet in the next few days. These instructions provide you with information on caring for yourself after you have had a joint injection. Your caregiver also may give you more specific instructions. Your treatment has been planned according to current medical practices, but problems sometimes occur. Call your caregiver if you have any problems or questions after your procedure. After any type of joint injection, it is not uncommon to experience:  Soreness, swelling, or bruising around the injection site.  Mild numbness, tingling, or weakness around the injection site caused by the numbing medicine used before or with the injection. It also is possible to experience the following effects associated with the specific agent after injection:  Iodine-based contrast agents:  Allergic reaction (itching, hives, widespread redness, and swelling beyond the injection site).  Corticosteroids (These effects are rare.):  Allergic reaction.  Increased blood sugar levels (If you have diabetes and you notice that your blood sugar levels have increased, notify your caregiver).  Increased blood pressure levels.  Mood swings.  Hyaluronic acid in the use of viscosupplementation.  Temporary heat or redness.  Temporary rash and itching.  Increased fluid accumulation in the injected joint. These effects all should resolve within a day after your procedure.  HOME CARE INSTRUCTIONS  Limit yourself to light activity the day of your procedure. Avoid lifting heavy objects, bending, stooping, or twisting.  Take prescription or over-the-counter pain medication as directed by your caregiver.  You may apply ice to your injection site to reduce pain and swelling the day of your procedure. Ice may be applied 03-04 times:  Put ice in a plastic bag.  Place a towel between your skin and the bag.  Leave the ice on for no  longer than 15-20 minutes each time. SEEK IMMEDIATE MEDICAL CARE IF:   Pain and swelling get worse rather than better or extend beyond the injection site.  Numbness does not go away.  Blood or fluid continues to leak from the injection site.  You have chest pain.  You have swelling of your face or tongue.  You have trouble breathing or you become dizzy.  You develop a fever, chills, or severe tenderness at the injection site that last longer than 1 day. MAKE SURE YOU:  Understand these instructions.  Watch your condition.  Get help right away if you are not doing well or if you get worse. Document Released: 05/01/2011 Document Revised: 11/10/2011 Document Reviewed: 05/01/2011 [facility] Patient Information 2015 [place], [place]. This information is not intended to replace advice given to you by your health care provider. Make sure you discuss any questions you have with your health care provider.

## 2015-03-08 NOTE — Progress Notes (Signed)
Patient ID: Christopher [name], male   DOB: Nov 10, 1955, 59 y.o.   MRN: [id number]  Follow up visit  Chief Complaint  Patient presents with  . Injections    requests left shoulder injection    BP 144/78 mmHg  Ht 5\' 6"  (1.676 m)  Wt 232 lb (105.235 kg)  BMI 37.46 kg/m2  No diagnosis found.  The patient was moving his son and started having some pain in the shoulder status post multiple surgeries he has severe glenohumeral arthritis had chondral debridement is also had a rotator cuff repair   He would like an injection in his left shoulder  Procedure note the subacromial injection shoulder left   Verbal consent was obtained to inject the  Left   Shoulder  Timeout was completed to confirm the injection site is a subacromial space of the  left  shoulder  Medication used Depo-Medrol 40 mg and lidocaine 1% 3 cc  Anesthesia was provided by ethyl chloride  The injection was performed in the left  posterior subacromial space. After pinning the skin with alcohol and anesthetized the skin with ethyl chloride the subacromial space was injected using a 20-gauge needle. There were no complications  Sterile dressing was applied.

## 2015-04-17 ENCOUNTER — Telehealth: Payer: Self-pay | Admitting: Orthopedic Surgery

## 2015-04-17 NOTE — Telephone Encounter (Signed)
Call from patient/wife regarding left shoulder, said still hurting after injection, which was done on 03/08/15; is it too soon to schedule appointment for possible repeat injection, or is there any other recommdation?  Please advise.  Ph# [phone]

## 2015-04-18 NOTE — Telephone Encounter (Signed)
Routing to Dr Harrison 

## 2015-04-18 NOTE — Telephone Encounter (Signed)
YES TOO SOON   GIVE HIM A 5 MG 12 DAY DOSE PACK

## 2015-04-18 NOTE — Telephone Encounter (Signed)
PREDNISONE ALLERGY- ANKLE AND NECK SWELLING  ANY OTHER RECOMMENDATIONS

## 2015-04-19 NOTE — Telephone Encounter (Signed)
Patients wife aware

## 2015-04-19 NOTE — Telephone Encounter (Signed)
Try bio or max freeze

## 2015-05-10 ENCOUNTER — Ambulatory Visit: Payer: Self-pay | Admitting: Orthopedic Surgery

## 2015-07-03 ENCOUNTER — Other Ambulatory Visit (HOSPITAL_COMMUNITY): Payer: Self-pay | Admitting: Respiratory Therapy

## 2015-07-03 ENCOUNTER — Other Ambulatory Visit (HOSPITAL_COMMUNITY): Payer: Self-pay | Admitting: Pulmonary Disease

## 2015-07-03 DIAGNOSIS — I739 Peripheral vascular disease, unspecified: Secondary | ICD-10-CM

## 2015-07-03 DIAGNOSIS — J441 Chronic obstructive pulmonary disease with (acute) exacerbation: Secondary | ICD-10-CM

## 2015-07-04 ENCOUNTER — Other Ambulatory Visit: Payer: Self-pay | Admitting: Acute Care

## 2015-07-06 ENCOUNTER — Ambulatory Visit (HOSPITAL_COMMUNITY)
Admission: RE | Admit: 2015-07-06 | Discharge: 2015-07-06 | Disposition: A | Discharge status: Home / Self Care | Payer: PPO | Source: Ambulatory Visit | Attending: Pulmonary Disease | Admitting: Pulmonary Disease

## 2015-07-06 DIAGNOSIS — E785 Hyperlipidemia, unspecified: Secondary | ICD-10-CM | POA: Diagnosis not present

## 2015-07-06 DIAGNOSIS — I739 Peripheral vascular disease, unspecified: Secondary | ICD-10-CM

## 2015-07-06 DIAGNOSIS — J441 Chronic obstructive pulmonary disease with (acute) exacerbation: Secondary | ICD-10-CM | POA: Diagnosis not present

## 2015-07-06 MED ORDER — ALBUTEROL SULFATE (2.5 MG/3ML) 0.083% IN NEBU
2.5000 mg | INHALATION_SOLUTION | Freq: Once | RESPIRATORY_TRACT | Status: AC
  Administered 2015-07-06: 2.5 mg via RESPIRATORY_TRACT

## 2015-07-08 LAB — PULMONARY FUNCTION TEST
DL/VA % PRED: 76 %
DL/VA: 3.35 ml/min/mmHg/L
DLCO UNC % PRED: 62 %
DLCO unc: 16.81 ml/min/mmHg
FEF 25-75 PRE: 2.11 L/s
FEF 25-75 Post: 2.6 L/sec
FEF2575-%CHANGE-POST: 23 %
FEF2575-%PRED-PRE: 76 %
FEF2575-%Pred-Post: 94 %
FEV1-%Change-Post: 4 %
FEV1-%PRED-POST: 78 %
FEV1-%Pred-Pre: 74 %
FEV1-Post: 2.5 L
FEV1-Pre: 2.38 L
FEV1FVC-%CHANGE-POST: 0 %
FEV1FVC-%Pred-Pre: 101 %
FEV6-%Change-Post: 4 %
FEV6-%Pred-Post: 80 %
FEV6-%Pred-Pre: 77 %
FEV6-Post: 3.24 L
FEV6-Pre: 3.09 L
FEV6FVC-%Change-Post: 0 %
FEV6FVC-%PRED-POST: 104 %
FEV6FVC-%Pred-Pre: 105 %
FVC-%Change-Post: 5 %
FVC-%PRED-PRE: 73 %
FVC-%Pred-Post: 77 %
FVC-PRE: 3.09 L
FVC-Post: 3.26 L
PRE FEV1/FVC RATIO: 77 %
Post FEV1/FVC ratio: 77 %
Post FEV6/FVC ratio: 99 %
Pre FEV6/FVC Ratio: 100 %
RV % pred: 142 %
RV: 2.8 L
TLC % PRED: 97 %
TLC: 6 L

## 2015-07-09 ENCOUNTER — Other Ambulatory Visit: Payer: Self-pay | Admitting: Acute Care

## 2015-07-09 DIAGNOSIS — F17210 Nicotine dependence, cigarettes, uncomplicated: Principal | ICD-10-CM

## 2015-07-12 ENCOUNTER — Ambulatory Visit (INDEPENDENT_AMBULATORY_CARE_PROVIDER_SITE_OTHER): Payer: PPO | Admitting: Acute Care

## 2015-07-12 ENCOUNTER — Ambulatory Visit (INDEPENDENT_AMBULATORY_CARE_PROVIDER_SITE_OTHER)
Admission: RE | Admit: 2015-07-12 | Discharge: 2015-07-12 | Disposition: A | Discharge status: Home / Self Care | Payer: PPO | Source: Ambulatory Visit | Attending: Acute Care | Admitting: Acute Care

## 2015-07-12 ENCOUNTER — Encounter: Payer: Self-pay | Admitting: Acute Care

## 2015-07-12 ENCOUNTER — Inpatient Hospital Stay: Admission: RE | Admit: 2015-07-12 | Payer: Self-pay | Source: Ambulatory Visit

## 2015-07-12 DIAGNOSIS — F17210 Nicotine dependence, cigarettes, uncomplicated: Secondary | ICD-10-CM

## 2015-07-12 NOTE — Progress Notes (Signed)
Shared Decision Making Visit Lung Cancer Screening Program [phone])   Eligibility:  Age 59 y.o.  Pack Years Smoking History Calculation : 41 pack year smoker (# packs/per year x # years smoked)  Recent History of coughing up blood  no  Unexplained weight loss? no ( >Than 15 pounds within the last 6 months )  Prior History Lung / other cancer no (Diagnosis within the last 5 years already requiring surveillance chest CT Scans).  Smoking Status Current Smoker  Former Smokers: Years since quit: NA  Quit Date: NA  Visit Components:  Discussion included one or more decision making aids. yes  Discussion included risk/benefits of screening. yes  Discussion included potential follow up diagnostic testing for abnormal scans. yes  Discussion included meaning and risk of over diagnosis. yes  Discussion included meaning and risk of False Positives. yes  Discussion included meaning of total radiation exposure. yes  Counseling Included:  Importance of adherence to annual lung cancer LDCT screening. yes  Impact of comorbidities on ability to participate in the program. yes  Ability and willingness to under diagnostic treatment. yes  Smoking Cessation Counseling:  Current Smokers:   Discussed importance of smoking cessation. yes  Information about tobacco cessation classes and interventions provided to patient. yes  Patient provided with "ticket" for LDCT Scan. yes  Symptomatic Patient. no  Counseling:na  Diagnosis Code: Tobacco Use Z72.0  Asymptomatic Patient yes  Counseling (Intermediate counseling: > three minutes counseling) [id number]  Former Smokers:   Discussed the importance of maintaining cigarette abstinence. NA, current smoker   Diagnosis Code: Personal History of Nicotine Dependence. [id number]  Information about tobacco cessation classes and interventions provided to patient. Yes  Patient provided with "ticket" for LDCT Scan. yes  Written Order for Lung  Cancer Screening with LDCT placed in Epic. Yes (CT Chest Lung Cancer Screening Low Dose W/O CM) [id number] Z12.2-Screening of respiratory organs Z87.891-Personal history of nicotine dependence  I spent 15 minutes of face to face time with Mr. and Mrs. Drakes discussing the risks and benefits of lung cancer screening. We viewed a power point together that high lighted the above noted topics, pausing at intervals to allow for questions to be asked and answered to ensure understanding. We discussed that the single most powerful action that he could take to decrease his risk of lung cancer is to quit smoking. He has tried Chantix in the past with poor results due to the side effects of the medication. He did state that he would  be open to trying Wellbutrin. We discussed nicotine replacement therapy , and behavior modification in addition to the [employer] classes as an option to help him to quit smoking. He stated that he is not ready to set a quit date. I have told him to call me when he is ready and that I will help him in any way I can to meet this very important goal.I have given him the " Be stronger than your excuses card" with information about classes and also free nicotine replacement therapy.  I have also given him my card and contact information so that he can call me when he is ready to quit and I can support him through the process by making sure he has the tools he needs. We discussed the time and location of the scan, and that if he does not hear from me today about the scan, it will be Monday when I call with the results. He verbalized understanding of all of the  above and had no further questions upon leaving the office. He has my contact information to call me as needed for future questions.   Magdalen Spatz, NP

## 2015-07-16 ENCOUNTER — Telehealth: Payer: Self-pay | Admitting: Acute Care

## 2015-07-16 NOTE — Telephone Encounter (Signed)
I have called the results of Christopher [name]'s LDCT. I explained that the scan resulted in a Lung RADS 2, nodules with a very low likelihood of becoming a clinically active cancer due to size or lack of growth.I explained that the recommendation is for a repeat annual scan in 12 months as part of his regular health maintenance. He verbalized understanding of the results. I told him we will call and schedule the repeat scan in 12 months, and to expect the call in October of 2017 for a scan scheduled for Nov. 2017. He verbalized understanding, and has my contact information in the event he needs to call me or has any questions.

## 2015-08-07 ENCOUNTER — Ambulatory Visit (INDEPENDENT_AMBULATORY_CARE_PROVIDER_SITE_OTHER): Payer: PPO | Admitting: Orthopedic Surgery

## 2015-08-07 VITALS — BP 140/84 | Ht 66.0 in | Wt 235.0 lb

## 2015-08-07 DIAGNOSIS — M7552 Bursitis of left shoulder: Secondary | ICD-10-CM | POA: Diagnosis not present

## 2015-08-07 DIAGNOSIS — M129 Arthropathy, unspecified: Secondary | ICD-10-CM

## 2015-08-07 DIAGNOSIS — M19019 Primary osteoarthritis, unspecified shoulder: Secondary | ICD-10-CM

## 2015-08-07 NOTE — Patient Instructions (Signed)
Joint Injection  Care After  Refer to this sheet in the next few days. These instructions provide you with information on caring for yourself after you have had a joint injection. Your caregiver also may give you more specific instructions. Your treatment has been planned according to current medical practices, but problems sometimes occur. Call your caregiver if you have any problems or questions after your procedure.  After any type of joint injection, it is not uncommon to experience:  · Soreness, swelling, or bruising around the injection site.  · Mild numbness, tingling, or weakness around the injection site caused by the numbing medicine used before or with the injection.  It also is possible to experience the following effects associated with the specific agent after injection:  · Iodine-based contrast agents:  ¨ Allergic reaction (itching, hives, widespread redness, and swelling beyond the injection site).  · Corticosteroids (These effects are rare.):  ¨ Allergic reaction.  ¨ Increased blood sugar levels (If you have diabetes and you notice that your blood sugar levels have increased, notify your caregiver).  ¨ Increased blood pressure levels.  ¨ Mood swings.  · Hyaluronic acid in the use of viscosupplementation.  ¨ Temporary heat or redness.  ¨ Temporary rash and itching.  ¨ Increased fluid accumulation in the injected joint.  These effects all should resolve within a day after your procedure.   HOME CARE INSTRUCTIONS  · Limit yourself to light activity the day of your procedure. Avoid lifting heavy objects, bending, stooping, or twisting.  · Take prescription or over-the-counter pain medication as directed by your caregiver.  · You may apply ice to your injection site to reduce pain and swelling the day of your procedure. Ice may be applied 03-04 times:  ¨ Put ice in a plastic bag.  ¨ Place a towel between your skin and the bag.  ¨ Leave the ice on for no longer than 15-20 minutes each time.  SEEK  IMMEDIATE MEDICAL CARE IF:   · Pain and swelling get worse rather than better or extend beyond the injection site.  · Numbness does not go away.  · Blood or fluid continues to leak from the injection site.  · You have chest pain.  · You have swelling of your face or tongue.  · You have trouble breathing or you become dizzy.  · You develop a fever, chills, or severe tenderness at the injection site that last longer than 1 day.  MAKE SURE YOU:  · Understand these instructions.  · Watch your condition.  · Get help right away if you are not doing well or if you get worse.  Document Released: 05/01/2011 Document Revised: 11/10/2011 Document Reviewed: 05/01/2011  [employer]® Patient Information ©2015 [employer]. This information is not intended to replace advice given to you by your health care provider. Make sure you discuss any questions you have with your health care provider.

## 2015-08-07 NOTE — Progress Notes (Signed)
Chief Complaint  Patient presents with  . Shoulder Pain    recurring Left shoulder pain, RCR 05/05/14   HPI Comments: Put up a fence and did some other heavy labor. H/O multiple surgeries left shoulder   Shoulder Pain  The pain is present in the right shoulder. This is a recurrent problem. The current episode started in the past 7 days. There has been no history of extremity trauma. The problem occurs constantly. The problem has been unchanged. The quality of the pain is described as aching. The pain is moderate. Associated symptoms include joint locking, a limited range of motion, stiffness and tingling. Pertinent negatives include no fever or numbness. The symptoms are aggravated by activity, cold and lying down.   Review of Systems  Constitutional: Negative for fever.  Musculoskeletal: Positive for stiffness.  Neurological: Positive for tingling. Negative for numbness.    Past Medical History  Diagnosis Date  . Hyperlipidemia   . Anxiety   . Depression   . GERD (gastroesophageal reflux disease)   . Sleep apnea     uses CIPAP machine at night  . Heart murmur     Years ago- not now  . Headache(784.0)     after surgery  . Complication of anesthesia     pt had a hard time being able to move after spinal anesthesia , 3-4 hours  . PONV (postoperative nausea and vomiting)   . Arthritis   . Diabetes mellitus without complication ([place])     pt sts borderline   Physical Exam  Constitutional: He is oriented to person, place, and time. He appears well-developed and well-nourished.  Neck: No tracheal deviation present. No thyromegaly present.  Cardiovascular: Intact distal pulses.   Lymphadenopathy:    He has no cervical adenopathy.  Neurological: He is alert and oriented to person, place, and time. He has normal reflexes.  Skin: Skin is warm and dry. No rash noted. No erythema.  Psychiatric: He has a normal mood and affect. His behavior is normal. Judgment and thought content normal.    Right Shoulder Exam   Tenderness  The patient is experiencing no tenderness.    Range of Motion  Forward Flexion: normal   Muscle Strength  The patient has normal right shoulder strength.  Other  Erythema: absent Sensation: normal Pulse: present   Left Shoulder Exam   Tenderness  The patient is experiencing tenderness in the biceps tendon (joint line ).  Range of Motion  Active Abduction: abnormal  Passive Abduction: abnormal  Forward Flexion: 130  External Rotation: 40   Muscle Strength  Abduction: 4/5  Internal Rotation: 5/5  External Rotation: 5/5  Supraspinatus: 4/5  Subscapularis: 5/5  Biceps: 5/5   Tests  Apprehension: positive Cross Arm: negative Drop Arm: negative Impingement: positive Sulcus: absent  Other  Erythema: absent Scars: absent Sensation: normal Pulse: present      Shoulder bursisits and osteoarthritis   SA INJECTION  Procedure note the subacromial injection shoulder left   Verbal consent was obtained to inject the  Left   Shoulder  Timeout was completed to confirm the injection site is a subacromial space of the  left  shoulder  Medication used Depo-Medrol 40 mg and lidocaine 1% 3 cc  Anesthesia was provided by ethyl chloride  The injection was performed in the left  posterior subacromial space. After pinning the skin with alcohol and anesthetized the skin with ethyl chloride the subacromial space was injected using a 20-gauge needle. There were no complications  Sterile dressing was applied.

## 2015-08-09 ENCOUNTER — Ambulatory Visit: Payer: PPO | Admitting: Orthopedic Surgery

## 2015-08-23 ENCOUNTER — Other Ambulatory Visit (HOSPITAL_COMMUNITY): Payer: Self-pay | Admitting: Pulmonary Disease

## 2015-08-23 DIAGNOSIS — R109 Unspecified abdominal pain: Secondary | ICD-10-CM

## 2015-08-28 ENCOUNTER — Ambulatory Visit (HOSPITAL_COMMUNITY)
Admission: RE | Admit: 2015-08-28 | Discharge: 2015-08-28 | Disposition: A | Discharge status: Home / Self Care | Payer: PPO | Source: Ambulatory Visit | Attending: Pulmonary Disease | Admitting: Pulmonary Disease

## 2015-08-28 DIAGNOSIS — N281 Cyst of kidney, acquired: Secondary | ICD-10-CM | POA: Insufficient documentation

## 2015-08-28 DIAGNOSIS — R197 Diarrhea, unspecified: Secondary | ICD-10-CM | POA: Diagnosis not present

## 2015-08-28 DIAGNOSIS — R109 Unspecified abdominal pain: Secondary | ICD-10-CM

## 2015-08-28 DIAGNOSIS — R1900 Intra-abdominal and pelvic swelling, mass and lump, unspecified site: Secondary | ICD-10-CM | POA: Diagnosis not present

## 2015-08-28 DIAGNOSIS — K760 Fatty (change of) liver, not elsewhere classified: Secondary | ICD-10-CM | POA: Insufficient documentation

## 2015-08-28 DIAGNOSIS — K5710 Diverticulosis of small intestine without perforation or abscess without bleeding: Secondary | ICD-10-CM | POA: Insufficient documentation

## 2015-08-28 DIAGNOSIS — R1084 Generalized abdominal pain: Secondary | ICD-10-CM | POA: Diagnosis present

## 2015-08-28 LAB — POCT I-STAT CREATININE: Creatinine, Ser: 1 mg/dL (ref 0.61–1.24)

## 2015-08-28 MED ORDER — IOHEXOL 300 MG/ML  SOLN
100.0000 mL | Freq: Once | INTRAMUSCULAR | Status: AC | PRN
  Administered 2015-08-28: 100 mL via INTRAVENOUS

## 2015-09-04 DIAGNOSIS — Z1211 Encounter for screening for malignant neoplasm of colon: Secondary | ICD-10-CM | POA: Diagnosis not present

## 2015-09-05 ENCOUNTER — Encounter (INDEPENDENT_AMBULATORY_CARE_PROVIDER_SITE_OTHER): Payer: Self-pay | Admitting: *Deleted

## 2015-09-20 ENCOUNTER — Encounter (INDEPENDENT_AMBULATORY_CARE_PROVIDER_SITE_OTHER): Payer: Self-pay

## 2015-09-20 ENCOUNTER — Encounter (INDEPENDENT_AMBULATORY_CARE_PROVIDER_SITE_OTHER): Payer: Self-pay | Admitting: Internal Medicine

## 2015-09-20 ENCOUNTER — Ambulatory Visit (INDEPENDENT_AMBULATORY_CARE_PROVIDER_SITE_OTHER): Payer: PPO | Admitting: Internal Medicine

## 2015-09-20 VITALS — BP 132/72 | HR 72 | Temp 98.5°F | Ht 66.0 in | Wt 245.0 lb

## 2015-09-20 DIAGNOSIS — B9681 Helicobacter pylori [H. pylori] as the cause of diseases classified elsewhere: Secondary | ICD-10-CM | POA: Diagnosis not present

## 2015-09-20 DIAGNOSIS — K760 Fatty (change of) liver, not elsewhere classified: Secondary | ICD-10-CM | POA: Diagnosis not present

## 2015-09-20 DIAGNOSIS — A048 Other specified bacterial intestinal infections: Secondary | ICD-10-CM

## 2015-09-20 LAB — HEPATIC FUNCTION PANEL
ALK PHOS: 69 U/L (ref 40–115)
ALT: 34 U/L (ref 9–46)
AST: 18 U/L (ref 10–35)
Albumin: 4.3 g/dL (ref 3.6–5.1)
BILIRUBIN DIRECT: 0.1 mg/dL (ref <=0.2)
BILIRUBIN INDIRECT: 0.3 mg/dL (ref 0.2–1.2)
TOTAL PROTEIN: 6.6 g/dL (ref 6.1–8.1)
Total Bilirubin: 0.4 mg/dL (ref 0.2–1.2)

## 2015-09-20 MED ORDER — AMOXICILLIN 500 MG PO CAPS: 1000.0000 mg | ORAL_CAPSULE | Freq: Two times a day (BID) | ORAL | Status: DC

## 2015-09-20 MED ORDER — HYOSCYAMINE SULFATE 0.125 MG SL SUBL
0.1250 mg | SUBLINGUAL_TABLET | SUBLINGUAL | Status: DC | PRN
Start: 2015-09-20 — End: 2015-12-19

## 2015-09-20 MED ORDER — CLARITHROMYCIN 500 MG PO TABS: 500.0000 mg | ORAL_TABLET | Freq: Two times a day (BID) | ORAL | Status: DC

## 2015-09-20 MED ORDER — OMEPRAZOLE 20 MG PO CPDR: 20.0000 mg | DELAYED_RELEASE_CAPSULE | Freq: Every day | ORAL | Status: DC

## 2015-09-20 NOTE — Patient Instructions (Addendum)
Hepatic function. Diet and exercise. Levsin to his drug store

## 2015-09-20 NOTE — Progress Notes (Signed)
Subjective:    Patient ID: Christopher [name], male    DOB: 1956/01/09, 60 y.o.   MRN: [id number]  HPI here today with c/o bloating and urgency to have a BM. He says he has diarrhea when he eats.  He tells me he usually has 3 stools a day. Hx of IBS per Dr. Olevia Perches notes. His stool was formed yesterday. He has bloating. He does not have diarrhea every day. He was started on Dicyclomine but he thinks he had an allergic reaction (black tongue) He usually has a normal BM every 3-4 days.  No melena or BRRB. His last colonoscopy was in 2016 (see below).  He saw Dr. Luan Pulling 08/23/2015 and his H. Pylori was +. I will treat this.  08/23/2015 WBC 8.4, H and H 13.8 and 39.0, Platelet ct 213, lipase 93    08/28/2015 CT abdomen/pelvis with CM: bloating, abdominal pain, diarrhea: CLINICAL DATA: 60 year old with chronic intermittent generalized abdominal pain, swelling and diarrhea over the past approximate 1 year. Surgical history includes cholecystectomy, appendectomy and hernia repair.  IMPRESSION: 1. No acute abnormality involving the abdomen or pelvis. 2. Diffuse hepatic steatosis without focal hepatic parenchymal abnormality. 3. Small diverticulum arising from the 3rd portion the duodenum. 4. Multiple bilateral renal cysts including a hemorrhagic or proteinaceous cyst arising from the mid left kidney. 5. Upper normal sized prostate gland with a prominent median lobe, query early BPH.  10/05/2014: Colonoscopy  Indications: Patient is 76 old Caucasian male with history of colonic adenomas. His last colonoscopy was in October 2012 with removal of 3 tubular adenomas. His prep was suboptimal and he was here for advice to return in 3 years. Presently he has no GI symptoms other than postprandial abdominal pain and diarrhea. Examination performed to cecum. Patient had ten polyps removed ranging in size from 4-7 mm.  Eight polyps were cold snared; one was ablated via cold biopsy and one polyp  was hot    snared from hepatic flexure( 7 mm).  Eight of these polyps were submitted together into from transverse colon were placed in separate container Small external hemorrhoids  Patient had 10 small polyps removed and they're all tubular adenomas. Patient believes he is having side effects with dicyclomine; he will therefore stop this medication He will try probiotic 1 capsule daily office visit next month. next colonoscopy in 3 years. Report to PCP  Review of Systems Past Medical History  Diagnosis Date  . Hyperlipidemia   . Anxiety   . Depression   . GERD (gastroesophageal reflux disease)   . Sleep apnea     uses CIPAP machine at night  . Heart murmur     Years ago- not now  . Headache(784.0)     after surgery  . Complication of anesthesia     pt had a hard time being able to move after spinal anesthesia , 3-4 hours  . PONV (postoperative nausea and vomiting)   . Arthritis   . Diabetes mellitus without complication ([place])     pt sts borderline    Past Surgical History  Procedure Laterality Date  . Knee arthroscopy      left knee  . Knee arthroscopy      right knee   . Spinal fusion      x 2  . Neck fusion    . Colonoscopy  06/27/2011    Procedure: COLONOSCOPY;  Surgeon: Rogene Houston, MD;  Location: [facility];  Service: Endoscopy;  Laterality: N/A;  9:00 /  Pt to be here at 9am for 10:45 procedure, benign polyps removed  . Hernia repair      umbilical hernia  . Cardiac catheterization    . Chondroplasty  08/15/2011    Procedure: CHONDROPLASTY;  Surgeon: [name], MD;  Location: [facility];  Service: Orthopedics;  Laterality: Left;  . Lumbar laminectomy/decompression microdiscectomy  09/14/2012    Procedure: LUMBAR LAMINECTOMY/DECOMPRESSION MICRODISCECTOMY 1 LEVEL;  Surgeon: Floyce Stakes, MD;  Location: [place] [facility];  Service: Neurosurgery;  Laterality: Right;  Right Lumbar three-four Diskectomy  . Shoulder surgery Right     Open  [place] procedure  . Appendectomy    . Back surgery      neck and back fusion  . Shoulder arthroscopy with rotator cuff repair Left 05/05/2014    Procedure: SHOULDER ARTHROSCOPY LIMITED DEBRIDEMENT;  Surgeon: Carole Civil, MD;  Location: [facility];  Service: Orthopedics;  Laterality: Left;  . Shoulder open rotator cuff repair Left 05/05/2014    Procedure: ROTATOR CUFF REPAIR SHOULDER OPEN;  Surgeon: Carole Civil, MD;  Location: [facility];  Service: Orthopedics;  Laterality: Left;  . Shoulder arthroscopy with bicepstenotomy Left 09/23/2013    Procedure: SHOULDER ARTHROSCOPY WITH BICEPSTENOTOMY AND EXTENSIVE DEBRIDEMENT;  Surgeon: Carole Civil, MD;  Location: [facility];  Service: Orthopedics;  Laterality: Left;  . Colonoscopy N/A 10/05/2014    Procedure: COLONOSCOPY;  Surgeon: Rogene Houston, MD;  Location: [facility];  Service: Endoscopy;  Laterality: N/A;  930    Allergies  Allergen Reactions  . Celebrex [Celecoxib] Itching and Swelling    All over  . Codeine Nausea And Vomiting    Extreme stomach pain. This includes anything with the derivative of codeine in it.  . Doxycycline Swelling    Made tongue turn black   . Cortisone Swelling  . Oxycodone-Acetaminophen Itching    Can tolerate with benadryl   . Prednisone Swelling  . Relafen [Nabumetone] Swelling    Current Outpatient Prescriptions on File Prior to Visit  Medication Sig Dispense Refill  . ALPRAZolam (XANAX) 0.5 MG tablet Take 0.5 mg by mouth 2 (two) times daily.     Marland Kitchen HYDROcodone-acetaminophen (NORCO) 10-325 MG per tablet Take 1 tablet by mouth every 4 (four) hours as needed. 180 tablet 0  . pantoprazole (PROTONIX) 40 MG tablet Take 40 mg by mouth daily.     . SEROQUEL 50 MG tablet Take 50 mg by mouth at bedtime.     . sertraline (ZOLOFT) 50 MG tablet Take 50 mg by mouth daily.     . Temazepam (RESTORIL PO) Take by mouth.     No current facility-administered medications on file prior to visit.          Objective:   Physical Exam  Blood pressure 132/72, pulse 72, temperature 98.5 F (36.9 C), height 5\' 6"  (1.676 m), weight 245 lb (111.131 kg). Alert and oriented. Skin warm and dry. Oral mucosa is moist.   . Sclera anicteric, conjunctivae is pink. Thyroid not enlarged. No cervical lymphadenopathy. Lungs clear. Heart regular rate and rhythm.  Abdomen is soft. Bowel sounds are positive. No hepatomegaly. No abdominal masses felt. Slight tenderness lower abdomen. Abdomen is obese.   No edema to lower extremities.                  Assessment & Plan:  IBS. He was intolerant of Bentyl. He developed a black tongue per patient. Am going to try him of Levsin 30 minutes before meals. H.  Pylori+. Am going to start him on Amoxicillin, Biaxin, and Prilosec all BID x 14 days. I asked patient and he is not allergic to any of these medications. OV in 3 months. Fatty liver: will get Hepatic function on him today. He needs to diet and exercise.

## 2015-09-24 ENCOUNTER — Telehealth (INDEPENDENT_AMBULATORY_CARE_PROVIDER_SITE_OTHER): Payer: Self-pay | Admitting: *Deleted

## 2015-09-24 DIAGNOSIS — A048 Other specified bacterial intestinal infections: Secondary | ICD-10-CM

## 2015-09-24 MED ORDER — AMOXICILLIN 500 MG PO CAPS: 1000.0000 mg | ORAL_CAPSULE | Freq: Two times a day (BID) | ORAL | Status: DC

## 2015-09-24 NOTE — Telephone Encounter (Signed)
Kendrew's wife called and said she thought he was to take the antibx for 14 days but was only given #28 pills only enough for 7 days.  Can you call them

## 2015-09-24 NOTE — Telephone Encounter (Signed)
Rx for amoxicillin sent to the pharmacy

## 2015-10-08 ENCOUNTER — Telehealth (INDEPENDENT_AMBULATORY_CARE_PROVIDER_SITE_OTHER): Payer: Self-pay | Admitting: Internal Medicine

## 2015-10-08 NOTE — Telephone Encounter (Signed)
He tells me he feels better. No bloating. No acid reflux. He has diarrhea when he eats fast foods. He should avoid fast foods.

## 2015-10-08 NOTE — Telephone Encounter (Signed)
Do not eat after 7pm.

## 2015-10-08 NOTE — Telephone Encounter (Signed)
Zelphia Cairo, the patient's wife called on behalf of Mr. Christopher [name] to let Terri know that he's finished his prescribed medication. He's wondering how he'll be able to tell if the bacterial infection is completely gone. He also wants Terri to know he's lost 8 lbs and has been drinking lots of water. However, when he eats fast food, he still gets diarrhea and he's wondering why and what he needs to do to move forward in treating this. He'd like a phone call when possible.   Pt's ph# [phone] Thank you.

## 2015-10-24 DIAGNOSIS — G4730 Sleep apnea, unspecified: Secondary | ICD-10-CM | POA: Diagnosis not present

## 2015-10-24 DIAGNOSIS — J449 Chronic obstructive pulmonary disease, unspecified: Secondary | ICD-10-CM | POA: Diagnosis not present

## 2015-10-24 DIAGNOSIS — K580 Irritable bowel syndrome with diarrhea: Secondary | ICD-10-CM | POA: Diagnosis not present

## 2015-10-24 DIAGNOSIS — I10 Essential (primary) hypertension: Secondary | ICD-10-CM | POA: Diagnosis not present

## 2015-11-21 ENCOUNTER — Other Ambulatory Visit: Payer: Self-pay | Admitting: Acute Care

## 2015-11-21 DIAGNOSIS — F17210 Nicotine dependence, cigarettes, uncomplicated: Principal | ICD-10-CM

## 2015-12-19 ENCOUNTER — Ambulatory Visit (INDEPENDENT_AMBULATORY_CARE_PROVIDER_SITE_OTHER): Payer: PPO | Admitting: Internal Medicine

## 2015-12-19 ENCOUNTER — Encounter (INDEPENDENT_AMBULATORY_CARE_PROVIDER_SITE_OTHER): Payer: Self-pay | Admitting: Internal Medicine

## 2015-12-19 ENCOUNTER — Other Ambulatory Visit (INDEPENDENT_AMBULATORY_CARE_PROVIDER_SITE_OTHER): Payer: Self-pay | Admitting: Internal Medicine

## 2015-12-19 ENCOUNTER — Encounter (INDEPENDENT_AMBULATORY_CARE_PROVIDER_SITE_OTHER): Payer: Self-pay | Admitting: *Deleted

## 2015-12-19 VITALS — BP 106/60 | HR 72 | Temp 98.0°F | Ht 66.0 in | Wt 239.2 lb

## 2015-12-19 DIAGNOSIS — R140 Abdominal distension (gaseous): Secondary | ICD-10-CM | POA: Diagnosis not present

## 2015-12-19 DIAGNOSIS — R1013 Epigastric pain: Secondary | ICD-10-CM | POA: Diagnosis not present

## 2015-12-19 NOTE — Patient Instructions (Addendum)
EGD. The risks and benefits such as perforation, bleeding, and infection were reviewed with the patient and is agreeable. Zantac at night.

## 2015-12-19 NOTE — Progress Notes (Signed)
Subjective:    Patient ID: Christopher [name], male    DOB: 08-21-1956, 60 y.o.   MRN: [id number]  HPI  Here today for f/u. He was last seen in January withC/o bloating.  He tells me he has some bloating. He was covered for the H. Pylori he felt better. Today he states all his symptoms have returned. He c/o epigastric pain.  He says he has epigastric cramps after he eats. He says he has acid reflux. The Protonix does help. Appetite is good. He does not eat  after 8pm. He is trying to lose weight.  No dysphagia.  Patient would like to proceed with an EGD.  He is having one BM a day.  Has tried the [facility] but it caused constipation.   Recent hx of H. Pylori with treatment in January. Covered with Amoxicillin, Biaxin and Prilosec x 14 days.      08/28/2015 CT abdomen/pelvis with CM: bloating, abdominal pain, diarrhea: CLINICAL DATA: 60 year old with chronic intermittent generalized abdominal pain, swelling and diarrhea over the past approximate 1 year. Surgical history includes cholecystectomy, appendectomy and hernia repair.  IMPRESSION: 1. No acute abnormality involving the abdomen or pelvis. 2. Diffuse hepatic steatosis without focal hepatic parenchymal abnormality. 3. Small diverticulum arising from the 3rd portion the duodenum. 4. Multiple bilateral renal cysts including a hemorrhagic or proteinaceous cyst arising from the mid left kidney. 5. Upper normal sized prostate gland with a prominent median lobe, query early BPH.  10/05/2014: Colonoscopy  Indications: Patient is 43 old Caucasian male with history of colonic adenomas. His last colonoscopy was in October 2012 with removal of 3 tubular adenomas. His prep was suboptimal and he was here for advice to return in 3 years. Presently he has no GI symptoms other than postprandial abdominal pain and diarrhea. Examination performed to cecum. Patient had ten polyps removed ranging in size from 4-7 mm.  Eight polyps were cold  snared; one was ablated via cold biopsy and one polyp was hot    snared from hepatic flexure( 7 mm).  Eight of these polyps were submitted together into from transverse colon were placed in separate container Small external hemorrhoids  Patient had 10 small polyps removed and they're all tubular adenomas. Patient believes he is having side effects with dicyclomine; he will therefore stop this medication He will try probiotic 1 capsule daily office visit next month. next colonoscopy in 3 years. Report to PCP  Hepatic Function Panel     Component Value Date/Time   PROT 6.6 09/20/2015 1149   ALBUMIN 4.3 09/20/2015 1149   AST 18 09/20/2015 1149   ALT 34 09/20/2015 1149   ALKPHOS 69 09/20/2015 1149   BILITOT 0.4 09/20/2015 1149   BILIDIR 0.1 09/20/2015 1149   IBILI 0.3 09/20/2015 1149        Review of Systems Past Medical History  Diagnosis Date  . Hyperlipidemia   . Anxiety   . Depression   . GERD (gastroesophageal reflux disease)   . Sleep apnea     uses CIPAP machine at night  . Heart murmur     Years ago- not now  . Headache(784.0)     after surgery  . Complication of anesthesia     pt had a hard time being able to move after spinal anesthesia , 3-4 hours  . PONV (postoperative nausea and vomiting)   . Arthritis   . Diabetes mellitus without complication ([place])     pt sts borderline    Past  Surgical History  Procedure Laterality Date  . Knee arthroscopy      left knee  . Knee arthroscopy      right knee   . Spinal fusion      x 2  . Neck fusion    . Colonoscopy  06/27/2011    Procedure: COLONOSCOPY;  Surgeon: Rogene Houston, MD;  [facility];  Service: Endoscopy;  Laterality: N/A;  9:00 / Pt to be here at 9am for 10:45 procedure, benign polyps removed  . Hernia repair      umbilical hernia  . Cardiac catheterization    . Chondroplasty  08/15/2011    Procedure: CHONDROPLASTY;  Surgeon: [name], MD;  Location: [facility];   Service: Orthopedics;  Laterality: Left;  . Lumbar laminectomy/decompression microdiscectomy  09/14/2012    Procedure: LUMBAR LAMINECTOMY/DECOMPRESSION MICRODISCECTOMY 1 LEVEL;  Surgeon: Floyce Stakes, MD;  Location: [place] [facility];  Service: Neurosurgery;  Laterality: Right;  Right Lumbar three-four Diskectomy  . Shoulder surgery Right     Open Mumford procedure  . Appendectomy    . Back surgery      neck and back fusion  . Shoulder arthroscopy with rotator cuff repair Left 05/05/2014    Procedure: SHOULDER ARTHROSCOPY LIMITED DEBRIDEMENT;  Surgeon: Carole Civil, MD;  Location: [facility];  Service: Orthopedics;  Laterality: Left;  . Shoulder open rotator cuff repair Left 05/05/2014    Procedure: ROTATOR CUFF REPAIR SHOULDER OPEN;  Surgeon: Carole Civil, MD;  Location: [facility];  Service: Orthopedics;  Laterality: Left;  . Shoulder arthroscopy with bicepstenotomy Left 09/23/2013    Procedure: SHOULDER ARTHROSCOPY WITH BICEPSTENOTOMY AND EXTENSIVE DEBRIDEMENT;  Surgeon: Carole Civil, MD;  Location: [facility];  Service: Orthopedics;  Laterality: Left;  . Colonoscopy N/A 10/05/2014    Procedure: COLONOSCOPY;  Surgeon: Rogene Houston, MD;  [facility];  Service: Endoscopy;  Laterality: N/A;  930    Allergies  Allergen Reactions  . Celebrex [Celecoxib] Itching and Swelling    All over  . Codeine Nausea And Vomiting    Extreme stomach pain. This includes anything with the derivative of codeine in it.  . Doxycycline Swelling    Made tongue turn black   . Cortisone Swelling  . Oxycodone-Acetaminophen Itching    Can tolerate with benadryl   . Prednisone Swelling  . Relafen [Nabumetone] Swelling    Current Outpatient Prescriptions on File Prior to Visit  Medication Sig Dispense Refill  . ALPRAZolam (XANAX) 0.5 MG tablet Take 0.5 mg by mouth 2 (two) times daily.     Marland Kitchen HYDROcodone-acetaminophen (NORCO) 10-325 MG per tablet Take 1 tablet by mouth every 4 (four) hours as needed.  180 tablet 0  . pantoprazole (PROTONIX) 40 MG tablet Take 40 mg by mouth daily.     . QUEtiapine (SEROQUEL) 50 MG tablet Take 50 mg by mouth at bedtime.    . sertraline (ZOLOFT) 50 MG tablet Take 50 mg by mouth daily.     . Temazepam (RESTORIL PO) Take by mouth.    . hyoscyamine (LEVSIN SL) 0.125 MG SL tablet Place 1 tablet (0.125 mg total) under the tongue every 4 (four) hours as needed. (Patient not taking: Reported on 12/19/2015) 90 tablet 4   No current facility-administered medications on file prior to visit.        Objective:   Physical ExamBlood pressure 106/60, pulse 72, temperature 98 F (36.7 C), height 5\' 6"  (1.676 m), weight 239 lb 3.2 oz (  108.5 kg). Alert and oriented. Skin warm and dry. Oral mucosa is moist.   . Sclera anicteric, conjunctivae is pink. Thyroid not enlarged. No cervical lymphadenopathy. Lungs clear. Heart regular rate and rhythm.  Abdomen is soft. Bowel sounds are positive. No hepatomegaly. No abdominal masses felt. Epigastric tenderness.  No edema to lower extremities.          Assessment & Plan:  Epigastric pain, bloating. PUD needs to be ruled out.  EGD.The risks and benefits such as perforation, bleeding, and infection were reviewed with the patient and is agreeable. May take a Zantac at night.

## 2015-12-27 ENCOUNTER — Encounter (HOSPITAL_COMMUNITY)
Admission: RE | Disposition: A | Discharge status: Home / Self Care | Payer: Self-pay | Source: Ambulatory Visit | Attending: Internal Medicine

## 2015-12-27 ENCOUNTER — Ambulatory Visit (HOSPITAL_COMMUNITY)
Admission: RE | Admit: 2015-12-27 | Discharge: 2015-12-27 | Disposition: A | Discharge status: Home / Self Care | Payer: PPO | Source: Ambulatory Visit | Attending: Internal Medicine | Admitting: Internal Medicine

## 2015-12-27 ENCOUNTER — Encounter (HOSPITAL_COMMUNITY): Payer: Self-pay | Admitting: *Deleted

## 2015-12-27 DIAGNOSIS — M1991 Primary osteoarthritis, unspecified site: Secondary | ICD-10-CM | POA: Diagnosis not present

## 2015-12-27 DIAGNOSIS — R1013 Epigastric pain: Secondary | ICD-10-CM | POA: Insufficient documentation

## 2015-12-27 DIAGNOSIS — F17210 Nicotine dependence, cigarettes, uncomplicated: Secondary | ICD-10-CM | POA: Diagnosis not present

## 2015-12-27 DIAGNOSIS — K5710 Diverticulosis of small intestine without perforation or abscess without bleeding: Secondary | ICD-10-CM | POA: Diagnosis not present

## 2015-12-27 DIAGNOSIS — F329 Major depressive disorder, single episode, unspecified: Secondary | ICD-10-CM | POA: Insufficient documentation

## 2015-12-27 DIAGNOSIS — Z8619 Personal history of other infectious and parasitic diseases: Secondary | ICD-10-CM | POA: Diagnosis not present

## 2015-12-27 DIAGNOSIS — Z79899 Other long term (current) drug therapy: Secondary | ICD-10-CM | POA: Insufficient documentation

## 2015-12-27 DIAGNOSIS — K2900 Acute gastritis without bleeding: Secondary | ICD-10-CM | POA: Insufficient documentation

## 2015-12-27 DIAGNOSIS — E119 Type 2 diabetes mellitus without complications: Secondary | ICD-10-CM | POA: Insufficient documentation

## 2015-12-27 DIAGNOSIS — K219 Gastro-esophageal reflux disease without esophagitis: Secondary | ICD-10-CM | POA: Insufficient documentation

## 2015-12-27 DIAGNOSIS — E785 Hyperlipidemia, unspecified: Secondary | ICD-10-CM | POA: Diagnosis not present

## 2015-12-27 DIAGNOSIS — F419 Anxiety disorder, unspecified: Secondary | ICD-10-CM | POA: Diagnosis not present

## 2015-12-27 DIAGNOSIS — K319 Disease of stomach and duodenum, unspecified: Secondary | ICD-10-CM | POA: Insufficient documentation

## 2015-12-27 DIAGNOSIS — R197 Diarrhea, unspecified: Secondary | ICD-10-CM | POA: Diagnosis not present

## 2015-12-27 DIAGNOSIS — G4730 Sleep apnea, unspecified: Secondary | ICD-10-CM | POA: Diagnosis not present

## 2015-12-27 DIAGNOSIS — K2960 Other gastritis without bleeding: Secondary | ICD-10-CM | POA: Diagnosis not present

## 2015-12-27 DIAGNOSIS — R140 Abdominal distension (gaseous): Secondary | ICD-10-CM

## 2015-12-27 HISTORY — PX: ESOPHAGOGASTRODUODENOSCOPY: SHX5428

## 2015-12-27 HISTORY — PX: BIOPSY: SHX5522

## 2015-12-27 SURGERY — EGD (ESOPHAGOGASTRODUODENOSCOPY)
Anesthesia: Moderate Sedation

## 2015-12-27 MED ORDER — MEPERIDINE HCL 50 MG/ML IJ SOLN
INTRAMUSCULAR | Status: DC | PRN
  Administered 2015-12-27 (×2): 25 mg via INTRAVENOUS

## 2015-12-27 MED ORDER — STERILE WATER FOR IRRIGATION IR SOLN
Status: DC | PRN
  Administered 2015-12-27: 15:00:00

## 2015-12-27 MED ORDER — BUTAMBEN-TETRACAINE-BENZOCAINE 2-2-14 % EX AERO
INHALATION_SPRAY | CUTANEOUS | Status: DC | PRN
  Administered 2015-12-27: 2 via TOPICAL

## 2015-12-27 MED ORDER — MIDAZOLAM HCL 5 MG/5ML IJ SOLN
INTRAMUSCULAR | Status: AC
  Filled 2015-12-27: qty 10

## 2015-12-27 MED ORDER — DICYCLOMINE HCL 10 MG PO CAPS: 10.0000 mg | ORAL_CAPSULE | Freq: Three times a day (TID) | ORAL | Status: DC

## 2015-12-27 MED ORDER — SODIUM CHLORIDE 0.9% FLUSH
INTRAVENOUS | Status: AC
  Filled 2015-12-27: qty 10

## 2015-12-27 MED ORDER — PROMETHAZINE HCL 25 MG/ML IJ SOLN
INTRAMUSCULAR | Status: AC
  Administered 2015-12-27: 25 mg
  Filled 2015-12-27: qty 1

## 2015-12-27 MED ORDER — MEPERIDINE HCL 50 MG/ML IJ SOLN
INTRAMUSCULAR | Status: DC
Start: 2015-12-27 — End: 2015-12-27
  Filled 2015-12-27: qty 1

## 2015-12-27 MED ORDER — SODIUM CHLORIDE 0.9 % IV SOLN
INTRAVENOUS | Status: DC
  Administered 2015-12-27: 14:00:00 via INTRAVENOUS

## 2015-12-27 MED ORDER — MIDAZOLAM HCL 5 MG/5ML IJ SOLN
INTRAMUSCULAR | Status: DC | PRN
  Administered 2015-12-27 (×3): 2 mg via INTRAVENOUS
  Administered 2015-12-27: 3 mg via INTRAVENOUS

## 2015-12-27 NOTE — Op Note (Signed)
[facility] Patient Name: Christopher [name] Procedure Date: 12/27/2015 2:25 PM MRN: [id number] Date of Birth: 21-Aug-1956 Attending MD: Hildred Laser , MD CSN: [id number] Age: 60 Admit Type: Outpatient Procedure:                Upper GI endoscopy Indications:              Epigastric abdominal pain, Gastro-esophageal reflux                            disease, Diarrhea Providers:                Hildred Laser, MD, Renda Rolls, RN, Isabella Stalling,                            Technician Referring MD:             Jasper Loser. Luan Pulling, MD Medicines:                Cetacaine spray, Meperidine 50 mg IV, Midazolam 9                            mg IV, Promethazine 25 mg IV Complications:            No immediate complications. Estimated Blood Loss:     Estimated blood loss was minimal. Procedure:                Pre-Anesthesia Assessment:                           - Prior to the procedure, a History and Physical                            was performed, and patient medications and                            allergies were reviewed. The patient's tolerance of                            previous anesthesia was also reviewed. The risks                            and benefits of the procedure and the sedation                            options and risks were discussed with the patient.                            All questions were answered, and informed consent                            was obtained. Prior Anticoagulants: The patient has                            taken no previous anticoagulant or antiplatelet  agents. ASA Grade Assessment: II - A patient with                            mild systemic disease. After reviewing the risks                            and benefits, the patient was deemed in                            satisfactory condition to undergo the procedure.                           After obtaining informed consent, the endoscope was   passed under direct vision. Throughout the                            procedure, the patient's blood pressure, pulse, and                            oxygen saturations were monitored continuously. The                            [id number]) scope was introduced through the                            mouth, and advanced to the second part of duodenum.                            The upper GI endoscopy was accomplished without                            difficulty. The patient tolerated the procedure                            well. Scope In: 2:53:45 PM Scope Out: 3:05:07 PM Total Procedure Duration: 0 hours 11 minutes 22 seconds  Findings:      esophagus normal.      Esophagogastric landmarks were identified: the Z-line was found at 40 cm       from the incisors.      Localized moderate inflammation characterized by congestion (edema),       erosions, erythema and granularity was found in the gastric fundus.       Biopsies were taken with a cold forceps for histology.      The exam of the stomach was otherwise normal.      The duodenal bulb was normal.      A 20 mm non-bleeding diverticulum was found in the second portion of the       duodenum.      random biopsies taken from mucosa of post bulbar duodenum. Impression:               - normal esophageal mucosa                           - Esophagogastric landmarks identified.                           -  Acute gastritis involving fundal mucosa                            consistent with Mallory-Weiss tear but no history                            of vomiting or heaving. random biopsies taken from                            or spinal bulbar mucosa..                           - Normal duodenal bulb. random biopsies taken.                           - Non-bleeding duodenal diverticulum.                           - Moderate Sedation:      Moderate (conscious) sedation was administered by the endoscopy nurse       and supervised by the  endoscopist. The following parameters were       monitored: oxygen saturation, heart rate, blood pressure, CO2       capnography and response to care. Total physician intraservice time was       17 minutes. Recommendation:           - Patient has a contact number available for                            emergencies. The signs and symptoms of potential                            delayed complications were discussed with the                            patient. Return to normal activities tomorrow.                            Written discharge instructions were provided to the                            patient.                           - Resume previous diet today.                           - Continue present medications.                           - No aspirin, ibuprofen, naproxen, or other                            non-steroidal anti-inflammatory drugs for 2 days  after biopsy.                           - No aspirin, ibuprofen, naproxen, or other                            non-steroidal anti-inflammatory drugs for 1 day                            after biopsy.                           - Use Bentyl (dicyclomine) 10 mg PO TID 30 min AC                            for 3 months.                           - Await pathology results. Procedure Code(s):        --- Professional ---                           [phone], Esophagogastroduodenoscopy, flexible,                            transoral; with biopsy, single or multiple                           99152, Moderate sedation services provided by the                            same physician or other qualified health care                            professional performing the diagnostic or                            therapeutic service that the sedation supports,                            requiring the presence of an independent trained                            observer to assist in the monitoring of the                             patient's level of consciousness and physiological                            status; initial 15 minutes of intraservice time,                            patient age 46 years or older Diagnosis Code(s):        --- Professional ---  K29.00, Acute gastritis without bleeding                           R10.13, Epigastric pain                           K21.9, Gastro-esophageal reflux disease without                            esophagitis                           R19.7, Diarrhea, unspecified                           K57.10, Diverticulosis of small intestine without                            perforation or abscess without bleeding CPT copyright 2016 American Medical Association. All rights reserved. The codes documented in this report are preliminary and upon coder review may  be revised to meet current compliance requirements. Hildred Laser, MD Hildred Laser, MD 12/27/2015 3:20:35 PM This report has been signed electronically. Number of Addenda: 0

## 2015-12-27 NOTE — Discharge Instructions (Signed)
Resume usual medications and diet. Dicyclomine 10 mg by mouth 30 minutes before each meal. No driving for 24 hours. Physician will call with biopsy results.        Esophagogastroduodenoscopy, Care After Refer to this sheet in the next few weeks. These instructions provide you with information about caring for yourself after your procedure. Your health care provider may also give you more specific instructions. Your treatment has been planned according to current medical practices, but problems sometimes occur. Call your health care provider if you have any problems or questions after your procedure. WHAT TO EXPECT AFTER THE PROCEDURE After your procedure, it is typical to feel:  Soreness in your throat.  Pain with swallowing.  Sick to your stomach (nauseous).  Bloated.  Dizzy.  Fatigued. HOME CARE INSTRUCTIONS  Do not eat or drink anything until the numbing medicine (local anesthetic) has worn off and your gag reflex has returned. You will know that the local anesthetic has worn off when you can swallow comfortably.  Do not drive or operate machinery until directed by your health care provider.  Take medicines only as directed by your health care provider. SEEK MEDICAL CARE IF:   You cannot stop coughing.  You are not urinating at all or less than usual. SEEK IMMEDIATE MEDICAL CARE IF:  You have difficulty swallowing.  You cannot eat or drink.  You have worsening throat or chest pain.  You have dizziness or lightheadedness or you faint.  You have nausea or vomiting.  You have chills.  You have a fever.  You have severe abdominal pain.  You have black, tarry, or bloody stools.   This information is not intended to replace advice given to you by your health care provider. Make sure you discuss any questions you have with your health care provider.   Document Released: 08/04/2012 Document Revised: 09/08/2014 Document Reviewed: 08/04/2012 Elsevier Interactive  Patient Education [employer].

## 2015-12-27 NOTE — H&P (Signed)
Christopher [name] is an 60 y.o. male.   Chief Complaint: Patient is here for esophagogastroduodenoscopy HPI: Patient is 60 year old Caucasian male who presents with several month history of epigastric pain and bloating and intermittent diarrhea. He denies melena nausea or vomiting. He says heartburns well controlled with therapy. His epigastric discomfort eases with meals returns within an hour or 2. He was treated for H. pylori infection back in January 2017 and felt well for few weeks. Patient says he has had GERD symptoms all his life. Upper GI tract has never been evaluated before. He denies dysphagia. History is negative for celiac disease.  Past Medical History  Diagnosis Date  . Hyperlipidemia   . Anxiety   . Depression   . GERD (gastroesophageal reflux disease)   . Sleep apnea     uses CIPAP machine at night  . Heart murmur     Years ago- not now  . Headache(784.0)     after surgery  . Complication of anesthesia     pt had a hard time being able to move after spinal anesthesia , 3-4 hours  . PONV (postoperative nausea and vomiting)   . Arthritis   . Diabetes mellitus without complication ([place])     pt sts borderline    Past Surgical History  Procedure Laterality Date  . Knee arthroscopy      left knee  . Knee arthroscopy      right knee   . Spinal fusion      x 2  . Neck fusion    . Colonoscopy  06/27/2011    Procedure: COLONOSCOPY;  Surgeon: Rogene Houston, MD;  [facility];  Service: Endoscopy;  Laterality: N/A;  9:00 / Pt to be here at 9am for 10:45 procedure, benign polyps removed  . Hernia repair      umbilical hernia  . Cardiac catheterization    . Chondroplasty  08/15/2011    Procedure: CHONDROPLASTY;  Surgeon: [name], MD;  Location: [facility];  Service: Orthopedics;  Laterality: Left;  . Lumbar laminectomy/decompression microdiscectomy  09/14/2012    Procedure: LUMBAR LAMINECTOMY/DECOMPRESSION MICRODISCECTOMY 1 LEVEL;  Surgeon: Floyce Stakes, MD;  Location: [place] [facility];  Service: Neurosurgery;  Laterality: Right;  Right Lumbar three-four Diskectomy  . Shoulder surgery Right     Open Mumford procedure  . Appendectomy    . Back surgery      neck and back fusion  . Shoulder arthroscopy with rotator cuff repair Left 05/05/2014    Procedure: SHOULDER ARTHROSCOPY LIMITED DEBRIDEMENT;  Surgeon: Carole Civil, MD;  Location: [facility];  Service: Orthopedics;  Laterality: Left;  . Shoulder open rotator cuff repair Left 05/05/2014    Procedure: ROTATOR CUFF REPAIR SHOULDER OPEN;  Surgeon: Carole Civil, MD;  Location: [facility];  Service: Orthopedics;  Laterality: Left;  . Shoulder arthroscopy with bicepstenotomy Left 09/23/2013    Procedure: SHOULDER ARTHROSCOPY WITH BICEPSTENOTOMY AND EXTENSIVE DEBRIDEMENT;  Surgeon: Carole Civil, MD;  Location: [facility];  Service: Orthopedics;  Laterality: Left;  . Colonoscopy N/A 10/05/2014    Procedure: COLONOSCOPY;  Surgeon: Rogene Houston, MD;  [facility];  Service: Endoscopy;  Laterality: N/A;  930    Family History  Problem Relation Age of Onset  . Diabetes    . Lung disease    . Arthritis    . Anesthesia problems Neg Hx   . Hypotension Neg Hx   . Malignant hyperthermia Neg Hx   .  Pseudochol deficiency Neg Hx    Social History:  reports that he has been smoking Cigarettes.  He has a 41 pack-year smoking history. He does not have any smokeless tobacco history on file. He reports that he does not drink alcohol or use illicit drugs.  Allergies:  Allergies  Allergen Reactions  . Celebrex [Celecoxib] Itching and Swelling    All over  . Codeine Nausea And Vomiting    Extreme stomach pain. This includes anything with the derivative of codeine in it.  . Doxycycline Swelling    Made tongue turn black   . Cortisone Swelling  . Oxycodone-Acetaminophen Itching    Can tolerate with benadryl   . Prednisone Swelling  . Relafen [Nabumetone] Swelling    Medications Prior  to Admission  Medication Sig Dispense Refill  . ALPRAZolam (XANAX) 0.5 MG tablet Take 0.5 mg by mouth 2 (two) times daily.     Marland Kitchen HYDROcodone-acetaminophen (NORCO) 10-325 MG per tablet Take 1 tablet by mouth every 4 (four) hours as needed. 180 tablet 0  . pantoprazole (PROTONIX) 40 MG tablet Take 40 mg by mouth daily.     . QUEtiapine (SEROQUEL) 50 MG tablet Take 50 mg by mouth at bedtime.    . sertraline (ZOLOFT) 50 MG tablet Take 50 mg by mouth daily.     . temazepam (RESTORIL) 30 MG capsule Take 1 capsule by mouth at bedtime.      No results found for this or any previous visit (from the past 48 hour(s)). No results found.  ROS  Blood pressure 125/68, pulse 59, temperature 98.4 F (36.9 C), temperature source Oral, resp. rate 17, height 5\' 6"  (1.676 m), weight 239 lb (108.41 kg), SpO2 95 %. Physical Exam  Constitutional: He appears well-developed and well-nourished.  HENT:  Mouth/Throat: Oropharynx is clear and moist.  Eyes: Conjunctivae are normal. No scleral icterus.  Neck: No thyromegaly present.  Cardiovascular: Normal rate, regular rhythm and normal heart sounds.   No murmur heard. Respiratory: Effort normal and breath sounds normal.  GI:  Abdomen is protuberant. Bowel sounds are normal. His mild to moderate midepigastric tenderness and mild tenderness in rest of the abdomen. No organomegaly or masses.  Musculoskeletal: He exhibits no edema.  Lymphadenopathy:    He has no cervical adenopathy.  Neurological: He is alert.  Skin: Skin is warm and dry.     Assessment/Plan Epigastric pain and bloating. History of H. pylori gastritis chest been treated. Chronic GERD. Diagnostic EGD.  Rogene Houston, MD 12/27/2015, 2:40 PM

## 2016-01-02 ENCOUNTER — Encounter (HOSPITAL_COMMUNITY): Payer: Self-pay | Admitting: Internal Medicine

## 2016-01-03 ENCOUNTER — Telehealth (INDEPENDENT_AMBULATORY_CARE_PROVIDER_SITE_OTHER): Payer: Self-pay | Admitting: Internal Medicine

## 2016-01-03 NOTE — Telephone Encounter (Signed)
Patient's wife called and they'd like to know the results from his procedure.  [phone]

## 2016-01-04 NOTE — Telephone Encounter (Signed)
A note has been left for Dr.Rehman to call the patient with the results.

## 2016-01-22 DIAGNOSIS — K5000 Crohn's disease of small intestine without complications: Secondary | ICD-10-CM | POA: Diagnosis not present

## 2016-01-22 DIAGNOSIS — M545 Low back pain: Secondary | ICD-10-CM | POA: Diagnosis not present

## 2016-01-22 DIAGNOSIS — I10 Essential (primary) hypertension: Secondary | ICD-10-CM | POA: Diagnosis not present

## 2016-01-22 DIAGNOSIS — J449 Chronic obstructive pulmonary disease, unspecified: Secondary | ICD-10-CM | POA: Diagnosis not present

## 2016-04-02 ENCOUNTER — Other Ambulatory Visit (INDEPENDENT_AMBULATORY_CARE_PROVIDER_SITE_OTHER): Payer: Self-pay | Admitting: Internal Medicine

## 2016-04-24 DIAGNOSIS — K760 Fatty (change of) liver, not elsewhere classified: Secondary | ICD-10-CM | POA: Diagnosis not present

## 2016-04-24 DIAGNOSIS — J449 Chronic obstructive pulmonary disease, unspecified: Secondary | ICD-10-CM | POA: Diagnosis not present

## 2016-04-24 DIAGNOSIS — N401 Enlarged prostate with lower urinary tract symptoms: Secondary | ICD-10-CM | POA: Diagnosis not present

## 2016-04-24 DIAGNOSIS — I10 Essential (primary) hypertension: Secondary | ICD-10-CM | POA: Diagnosis not present

## 2016-07-14 ENCOUNTER — Ambulatory Visit (HOSPITAL_COMMUNITY)
Admission: RE | Admit: 2016-07-14 | Discharge: 2016-07-14 | Disposition: A | Discharge status: Home / Self Care | Payer: PPO | Source: Ambulatory Visit | Attending: Acute Care | Admitting: Acute Care

## 2016-07-14 ENCOUNTER — Telehealth: Payer: Self-pay | Admitting: Acute Care

## 2016-07-14 DIAGNOSIS — J439 Emphysema, unspecified: Secondary | ICD-10-CM | POA: Diagnosis not present

## 2016-07-14 DIAGNOSIS — I700 Atherosclerosis of aorta: Secondary | ICD-10-CM | POA: Diagnosis not present

## 2016-07-14 DIAGNOSIS — I2510 Atherosclerotic heart disease of native coronary artery without angina pectoris: Secondary | ICD-10-CM | POA: Diagnosis not present

## 2016-07-14 DIAGNOSIS — F17210 Nicotine dependence, cigarettes, uncomplicated: Secondary | ICD-10-CM | POA: Diagnosis not present

## 2016-07-14 DIAGNOSIS — Z87891 Personal history of nicotine dependence: Secondary | ICD-10-CM | POA: Diagnosis not present

## 2016-07-14 NOTE — Telephone Encounter (Signed)
I have called the results of Mr. Christopher [name]'s low dose screening CT to the patient. I explained that his scan was read as a lung RADS category 2, indicating nodules that are benign in appearance or behavior. I explained that the recommendation per radiology was for continued annual screening with low-dose chest CT in 12 months. I explained that we will order and schedule that scan for November 2018. I also explained that the scan indicated some mild emphysema, and some aortic atherosclerosis and coronary artery calcifications. He is currently not on a statin medication. I told him that I will forward the results of this scan to Dr. Sinda Du to allow him to follow-up as he feels is clinically indicated as he knows this patient's health history well. Christopher [name] verbalized understanding of the above and had no questions upon completion of the call. He has my contact information in the event he has any questions in the future.

## 2016-07-30 DIAGNOSIS — J449 Chronic obstructive pulmonary disease, unspecified: Secondary | ICD-10-CM | POA: Diagnosis not present

## 2016-07-30 DIAGNOSIS — Z23 Encounter for immunization: Secondary | ICD-10-CM | POA: Diagnosis not present

## 2016-07-30 DIAGNOSIS — F17200 Nicotine dependence, unspecified, uncomplicated: Secondary | ICD-10-CM | POA: Diagnosis not present

## 2016-07-30 DIAGNOSIS — R079 Chest pain, unspecified: Secondary | ICD-10-CM | POA: Diagnosis not present

## 2016-07-30 DIAGNOSIS — I10 Essential (primary) hypertension: Secondary | ICD-10-CM | POA: Diagnosis not present

## 2016-07-31 ENCOUNTER — Other Ambulatory Visit (HOSPITAL_COMMUNITY): Payer: Self-pay | Admitting: Respiratory Therapy

## 2016-07-31 DIAGNOSIS — F17200 Nicotine dependence, unspecified, uncomplicated: Secondary | ICD-10-CM | POA: Diagnosis not present

## 2016-07-31 DIAGNOSIS — I10 Essential (primary) hypertension: Secondary | ICD-10-CM | POA: Diagnosis not present

## 2016-07-31 DIAGNOSIS — J449 Chronic obstructive pulmonary disease, unspecified: Secondary | ICD-10-CM | POA: Diagnosis not present

## 2016-07-31 DIAGNOSIS — J441 Chronic obstructive pulmonary disease with (acute) exacerbation: Secondary | ICD-10-CM

## 2016-08-15 ENCOUNTER — Inpatient Hospital Stay (HOSPITAL_COMMUNITY): Admission: RE | Admit: 2016-08-15 | Payer: Self-pay | Source: Ambulatory Visit

## 2016-08-15 ENCOUNTER — Encounter (HOSPITAL_COMMUNITY): Payer: Self-pay

## 2016-09-03 ENCOUNTER — Ambulatory Visit (INDEPENDENT_AMBULATORY_CARE_PROVIDER_SITE_OTHER): Payer: PPO | Admitting: Internal Medicine

## 2016-09-03 ENCOUNTER — Other Ambulatory Visit (HOSPITAL_COMMUNITY)
Admission: RE | Admit: 2016-09-03 | Discharge: 2016-09-03 | Disposition: A | Discharge status: Home / Self Care | Payer: PPO | Source: Ambulatory Visit | Attending: Internal Medicine | Admitting: Internal Medicine

## 2016-09-03 ENCOUNTER — Encounter: Payer: Self-pay | Admitting: Internal Medicine

## 2016-09-03 VITALS — BP 122/64 | HR 77 | Ht 66.0 in | Wt 235.0 lb

## 2016-09-03 DIAGNOSIS — R079 Chest pain, unspecified: Secondary | ICD-10-CM | POA: Diagnosis not present

## 2016-09-03 DIAGNOSIS — R0602 Shortness of breath: Secondary | ICD-10-CM

## 2016-09-03 DIAGNOSIS — Z01818 Encounter for other preprocedural examination: Secondary | ICD-10-CM

## 2016-09-03 LAB — CBC WITH DIFFERENTIAL/PLATELET
BASOS PCT: 0 %
Basophils Absolute: 0 10*3/uL (ref 0.0–0.1)
Eosinophils Absolute: 0.4 10*3/uL (ref 0.0–0.7)
Eosinophils Relative: 3 %
HEMATOCRIT: 42.4 % (ref 39.0–52.0)
Hemoglobin: 14.5 g/dL (ref 13.0–17.0)
LYMPHS PCT: 19 %
Lymphs Abs: 2.3 10*3/uL (ref 0.7–4.0)
MCH: 28.9 pg (ref 26.0–34.0)
MCHC: 34.2 g/dL (ref 30.0–36.0)
MCV: 84.6 fL (ref 78.0–100.0)
MONO ABS: 0.8 10*3/uL (ref 0.1–1.0)
MONOS PCT: 7 %
NEUTROS ABS: 8.8 10*3/uL — AB (ref 1.7–7.7)
Neutrophils Relative %: 71 %
Platelets: 248 10*3/uL (ref 150–400)
RBC: 5.01 MIL/uL (ref 4.22–5.81)
RDW: 14.2 % (ref 11.5–15.5)
WBC: 12.3 10*3/uL — ABNORMAL HIGH (ref 4.0–10.5)

## 2016-09-03 LAB — BASIC METABOLIC PANEL
Anion gap: 7 (ref 5–15)
BUN: 13 mg/dL (ref 6–20)
CALCIUM: 9.3 mg/dL (ref 8.9–10.3)
CO2: 27 mmol/L (ref 22–32)
CREATININE: 1.05 mg/dL (ref 0.61–1.24)
Chloride: 101 mmol/L (ref 101–111)
GFR calc Af Amer: >60 mL/min (ref >60)
GLUCOSE: 141 mg/dL — AB (ref 65–99)
Potassium: 4 mmol/L (ref 3.5–5.1)
SODIUM: 135 mmol/L (ref 135–145)

## 2016-09-03 LAB — PROTIME-INR
INR: 1
PROTHROMBIN TIME: 13.2 s (ref 11.4–15.2)

## 2016-09-03 MED ORDER — NITROGLYCERIN 0.4 MG SL SUBL: 0.4000 mg | SUBLINGUAL_TABLET | SUBLINGUAL | 3 refills | Status: DC | PRN

## 2016-09-03 NOTE — Patient Instructions (Signed)
Your physician has requested that you have a cardiac catheterization. Cardiac catheterization is used to diagnose and/or treat various heart conditions. Doctors may recommend this procedure for a number of different reasons. The most common reason is to evaluate chest pain. Chest pain can be a symptom of coronary artery disease (CAD), and cardiac catheterization can show whether plaque is narrowing or blocking your heart's arteries. This procedure is also used to evaluate the valves, as well as measure the blood flow and oxygen levels in different parts of your heart. For further information please visit [email or web address]. Please follow instruction sheet, as given.    Get blood work NOW     Thank you for choosing [facility] !

## 2016-09-03 NOTE — Progress Notes (Signed)
.0-   Cardiology Office Note   Date:  09/03/2016   ID:  Christopher [name], DOB 10/01/1955, MRN [id number]  PCP:  Alonza Bogus, MD  Cardiologist:   Dorris Carnes, MD   Christopher [name] referred for CP     History of Present Illness: Christopher [name] is a 61 y.o. male with a history of HTN, COPD, sleep apnea, tob use,  Seen by Susann Givens for CP  Not exertional  CT scan showed coroary calcifications    Pateint will have ice pick sensations in chest  COme and go  Takes aspirin Then chest can feel like going to blow up    Spells last 30 min   WIth activity worse  Then stop Going on for a few months   Cut back on acitvity    Christopher [name] says he has had cath in past but I find no records of this  Had stress test in past that was normal    Current Meds  Medication Sig  . ALPRAZolam (XANAX) 0.5 MG tablet Take 0.5 mg by mouth 2 (two) times daily.   Marland Kitchen atorvastatin (LIPITOR) 20 MG tablet Take 20 mg by mouth.   . dicyclomine (BENTYL) 10 MG capsule TAKE 1 CAPSULE THREE TIMES DAILY BETWEEN MEALS  . HYDROcodone-acetaminophen (NORCO) 10-325 MG per tablet Take 1 tablet by mouth every 4 (four) hours as needed.  . pantoprazole (PROTONIX) 40 MG tablet Take 40 mg by mouth daily.   . QUEtiapine (SEROQUEL) 50 MG tablet Take 50 mg by mouth at bedtime.  . sertraline (ZOLOFT) 50 MG tablet Take 50 mg by mouth daily.   . temazepam (RESTORIL) 30 MG capsule Take 1 capsule by mouth at bedtime.     Allergies:   Celebrex [celecoxib]; Codeine; Doxycycline; Cortisone; Oxycodone-acetaminophen; Prednisone; and Relafen [nabumetone]   Past Medical History:  Diagnosis Date  . Anxiety   . Arthritis   . Complication of anesthesia    Christopher [name] had a hard time being able to move after spinal anesthesia , 3-4 hours  . Depression   . Diabetes mellitus without complication ([place])    Christopher [name] sts borderline  . GERD (gastroesophageal reflux disease)   . Headache(784.0)    after surgery  . Heart murmur    Years ago- not now  . Hyperlipidemia   .  PONV (postoperative nausea and vomiting)   . Sleep apnea    uses CIPAP machine at night    Past Surgical History:  Procedure Laterality Date  . APPENDECTOMY    . BACK SURGERY     neck and back fusion  . BIOPSY  12/27/2015   Procedure: BIOPSY;  Surgeon: Rogene Houston, MD;  [facility];  Service: Endoscopy;;  Fundus biopsies and duodenal biopsies  . CARDIAC CATHETERIZATION    . CHONDROPLASTY  08/15/2011   Procedure: CHONDROPLASTY;  Surgeon: [name], MD;  Location: [facility];  Service: Orthopedics;  Laterality: Left;  . COLONOSCOPY  06/27/2011   Procedure: COLONOSCOPY;  Surgeon: Rogene Houston, MD;  [facility];  Service: Endoscopy;  Laterality: N/A;  9:00 / Christopher [name] to be here at 9am for 10:45 procedure, benign polyps removed  . COLONOSCOPY N/A 10/05/2014   Procedure: COLONOSCOPY;  Surgeon: Rogene Houston, MD;  [facility];  Service: Endoscopy;  Laterality: N/A;  930  . ESOPHAGOGASTRODUODENOSCOPY N/A 12/27/2015   Procedure: ESOPHAGOGASTRODUODENOSCOPY (EGD);  Surgeon: Rogene Houston, MD;  [facility];  Service: Endoscopy;  Laterality: N/A;  3:00  . HERNIA REPAIR     umbilical hernia  . KNEE ARTHROSCOPY     left knee  . KNEE ARTHROSCOPY     right knee   . LUMBAR LAMINECTOMY/DECOMPRESSION MICRODISCECTOMY  09/14/2012   Procedure: LUMBAR LAMINECTOMY/DECOMPRESSION MICRODISCECTOMY 1 LEVEL;  Surgeon: Floyce Stakes, MD;  Location: [place] [facility];  Service: Neurosurgery;  Laterality: Right;  Right Lumbar three-four Diskectomy  . neck fusion    . SHOULDER ARTHROSCOPY WITH BICEPSTENOTOMY Left 09/23/2013   Procedure: SHOULDER ARTHROSCOPY WITH BICEPSTENOTOMY AND EXTENSIVE DEBRIDEMENT;  Surgeon: Carole Civil, MD;  [facility];  Service: Orthopedics;  Laterality: Left;  . SHOULDER ARTHROSCOPY WITH ROTATOR CUFF REPAIR Left 05/05/2014   Procedure: SHOULDER ARTHROSCOPY LIMITED DEBRIDEMENT;  Surgeon: Carole Civil, MD;  [facility];   Service: Orthopedics;  Laterality: Left;  . SHOULDER OPEN ROTATOR CUFF REPAIR Left 05/05/2014   Procedure: ROTATOR CUFF REPAIR SHOULDER OPEN;  Surgeon: Carole Civil, MD;  [facility];  Service: Orthopedics;  Laterality: Left;  . SHOULDER SURGERY Right    Open Mumford procedure  . SPINAL FUSION     x 2     Social History:  The patient  reports that he has been smoking Cigarettes.  He has a 41.00 pack-year smoking history. He has never used smokeless tobacco. He reports that he does not drink alcohol or use drugs.  1 ppd    Family History:  The patient's family history is not on file.  Mother had stents  Alos had PVOD    Dad with DM      ROS:  Please see the history of present illness. All other systems are reviewed and  Negative to the above problem except as noted.    PHYSICAL EXAM: VS:  BP 122/64   Pulse 77   Ht 5\' 6"  (1.676 m)   Wt 235 lb (106.6 kg)   SpO2 98%   BMI 37.93 kg/m   GEN:  Morbidly obese 61 yo in no acute distress  HEENT: normal  Neck: no JVD, carotid bruits, or masses Cardiac: RRR; no murmurs, rubs, or gallops,no edema  Respiratory:  Decreased airflow   GI: soft, mild diffuse tendrness  Mild ventral hernia , + BS  No hepatomegaly  MS: no deformity Moving all extremities   Skin: warm and dry, no rash Neuro:  Strength and sensation are intact Psych: euthymic mood, full affect   EKG:  EKG is ordered today.  SB 58 bpm  Incomplete RBBB     Lipid Panel No results found for: CHOL, TRIG, HDL, CHOLHDL, VLDL, LDLCALC, LDLDIRECT    Wt Readings from Last 3 Encounters:  09/03/16 235 lb (106.6 kg)  12/27/15 239 lb (108.4 kg)  12/19/15 239 lb 3.2 oz (108.5 kg)      ASSESSMENT AND PLAN:  1  Chest pressure / chest pain  / SOB  Some is atypcial but some is very concerning for angina  CAD noted on CT scan I have discussed with Christopher [name]  I would reocmm R and L heart cath to define anatomy, pressuress (Christopher [name] and wife said he feels very full, distended , sOB at  times) Risk/benefits dscribed  Christopher [name] understands and agrees to proceed  WIll plan on tomorrow Rx NTG prn  2  HTN  FOllow    3  Cont lipitor  4  Tob  Counsedlled on cessation  Has evid of COPD on exam     Current medicines are reviewed at length with  the patient today.  The patient does not have concerns regarding medicines.  Signed, Dorris Carnes, MD  09/03/2016 1:35 PM    [facility] [place], [place], [place]  [address] Phone: [phone]; Fax: [phone]

## 2016-09-04 ENCOUNTER — Ambulatory Visit (HOSPITAL_COMMUNITY)
Admission: RE | Admit: 2016-09-04 | Discharge: 2016-09-04 | Disposition: A | Discharge status: Home / Self Care | Payer: PPO | Source: Ambulatory Visit | Attending: Cardiology | Admitting: Cardiology

## 2016-09-04 ENCOUNTER — Encounter (HOSPITAL_COMMUNITY)
Admission: RE | Disposition: A | Discharge status: Home / Self Care | Payer: Self-pay | Source: Ambulatory Visit | Attending: Cardiology

## 2016-09-04 DIAGNOSIS — R079 Chest pain, unspecified: Secondary | ICD-10-CM | POA: Diagnosis present

## 2016-09-04 DIAGNOSIS — F419 Anxiety disorder, unspecified: Secondary | ICD-10-CM | POA: Diagnosis not present

## 2016-09-04 DIAGNOSIS — K219 Gastro-esophageal reflux disease without esophagitis: Secondary | ICD-10-CM | POA: Insufficient documentation

## 2016-09-04 DIAGNOSIS — E785 Hyperlipidemia, unspecified: Secondary | ICD-10-CM | POA: Diagnosis not present

## 2016-09-04 DIAGNOSIS — G4730 Sleep apnea, unspecified: Secondary | ICD-10-CM | POA: Diagnosis not present

## 2016-09-04 DIAGNOSIS — J449 Chronic obstructive pulmonary disease, unspecified: Secondary | ICD-10-CM | POA: Diagnosis not present

## 2016-09-04 DIAGNOSIS — I10 Essential (primary) hypertension: Secondary | ICD-10-CM | POA: Insufficient documentation

## 2016-09-04 DIAGNOSIS — M1990 Unspecified osteoarthritis, unspecified site: Secondary | ICD-10-CM | POA: Diagnosis not present

## 2016-09-04 DIAGNOSIS — F17210 Nicotine dependence, cigarettes, uncomplicated: Secondary | ICD-10-CM | POA: Diagnosis not present

## 2016-09-04 DIAGNOSIS — E119 Type 2 diabetes mellitus without complications: Secondary | ICD-10-CM | POA: Insufficient documentation

## 2016-09-04 DIAGNOSIS — Z981 Arthrodesis status: Secondary | ICD-10-CM | POA: Insufficient documentation

## 2016-09-04 DIAGNOSIS — I209 Angina pectoris, unspecified: Secondary | ICD-10-CM | POA: Diagnosis present

## 2016-09-04 DIAGNOSIS — F329 Major depressive disorder, single episode, unspecified: Secondary | ICD-10-CM | POA: Diagnosis not present

## 2016-09-04 DIAGNOSIS — G4733 Obstructive sleep apnea (adult) (pediatric): Secondary | ICD-10-CM | POA: Diagnosis present

## 2016-09-04 DIAGNOSIS — Z6837 Body mass index (BMI) 37.0-37.9, adult: Secondary | ICD-10-CM | POA: Insufficient documentation

## 2016-09-04 DIAGNOSIS — Z833 Family history of diabetes mellitus: Secondary | ICD-10-CM | POA: Diagnosis not present

## 2016-09-04 HISTORY — PX: CARDIAC CATHETERIZATION: SHX172

## 2016-09-04 LAB — POCT I-STAT 3, VENOUS BLOOD GAS (G3P V)
ACID-BASE DEFICIT: 2 mmol/L (ref 0.0–2.0)
BICARBONATE: 24 mmol/L (ref 20.0–28.0)
O2 SAT: 68 %
PCO2 VEN: 44.6 mmHg (ref 44.0–60.0)
PO2 VEN: 38 mmHg (ref 32.0–45.0)
TCO2: 25 mmol/L (ref 0–100)
pH, Ven: 7.339 (ref 7.250–7.430)

## 2016-09-04 LAB — POCT I-STAT 3, ART BLOOD GAS (G3+)
Acid-base deficit: 3 mmol/L — ABNORMAL HIGH (ref 0.0–2.0)
BICARBONATE: 22.4 mmol/L (ref 20.0–28.0)
O2 SAT: 97 %
TCO2: 24 mmol/L (ref 0–100)
pCO2 arterial: 39.4 mmHg (ref 32.0–48.0)
pH, Arterial: 7.364 (ref 7.350–7.450)
pO2, Arterial: 93 mmHg (ref 83.0–108.0)

## 2016-09-04 LAB — POCT ACTIVATED CLOTTING TIME: Activated Clotting Time: 186 seconds

## 2016-09-04 SURGERY — RIGHT/LEFT HEART CATH AND CORONARY ANGIOGRAPHY

## 2016-09-04 MED ORDER — HEPARIN SODIUM (PORCINE) 1000 UNIT/ML IJ SOLN
INTRAMUSCULAR | Status: DC | PRN
  Administered 2016-09-04: 5000 [IU] via INTRAVENOUS

## 2016-09-04 MED ORDER — SODIUM CHLORIDE 0.9% FLUSH: 3.0000 mL | Freq: Two times a day (BID) | INTRAVENOUS | Status: DC

## 2016-09-04 MED ORDER — IOPAMIDOL (ISOVUE-370) INJECTION 76%
INTRAVENOUS | Status: AC
  Filled 2016-09-04: qty 100

## 2016-09-04 MED ORDER — HEPARIN SODIUM (PORCINE) 1000 UNIT/ML IJ SOLN
INTRAMUSCULAR | Status: AC
  Filled 2016-09-04: qty 1

## 2016-09-04 MED ORDER — FENTANYL CITRATE (PF) 100 MCG/2ML IJ SOLN
INTRAMUSCULAR | Status: DC | PRN
  Administered 2016-09-04: 25 ug via INTRAVENOUS

## 2016-09-04 MED ORDER — SODIUM CHLORIDE 0.9% FLUSH: 3.0000 mL | INTRAVENOUS | Status: DC | PRN

## 2016-09-04 MED ORDER — ASPIRIN 81 MG PO CHEW
81.0000 mg | CHEWABLE_TABLET | ORAL | Status: AC
  Administered 2016-09-04: 81 mg via ORAL

## 2016-09-04 MED ORDER — MIDAZOLAM HCL 2 MG/2ML IJ SOLN
INTRAMUSCULAR | Status: AC
  Filled 2016-09-04: qty 2

## 2016-09-04 MED ORDER — FENTANYL CITRATE (PF) 100 MCG/2ML IJ SOLN
INTRAMUSCULAR | Status: AC
  Filled 2016-09-04: qty 2

## 2016-09-04 MED ORDER — VERAPAMIL HCL 2.5 MG/ML IV SOLN
INTRAVENOUS | Status: DC | PRN
  Administered 2016-09-04: 10 mL via INTRA_ARTERIAL

## 2016-09-04 MED ORDER — SODIUM CHLORIDE 0.9 % IV SOLN: 250.0000 mL | INTRAVENOUS | Status: DC | PRN

## 2016-09-04 MED ORDER — SODIUM CHLORIDE 0.9 % WEIGHT BASED INFUSION
3.0000 mL/kg/h | INTRAVENOUS | Status: AC
  Administered 2016-09-04: 3 mL/kg/h via INTRAVENOUS

## 2016-09-04 MED ORDER — IOPAMIDOL (ISOVUE-370) INJECTION 76%
INTRAVENOUS | Status: DC | PRN
  Administered 2016-09-04: 70 mL via INTRAVENOUS

## 2016-09-04 MED ORDER — LIDOCAINE HCL (PF) 1 % IJ SOLN
INTRAMUSCULAR | Status: AC
  Filled 2016-09-04: qty 30

## 2016-09-04 MED ORDER — ASPIRIN 81 MG PO CHEW
CHEWABLE_TABLET | ORAL | Status: AC
  Administered 2016-09-04: 81 mg via ORAL
  Filled 2016-09-04: qty 1

## 2016-09-04 MED ORDER — MIDAZOLAM HCL 2 MG/2ML IJ SOLN
INTRAMUSCULAR | Status: DC | PRN
  Administered 2016-09-04: 2 mg via INTRAVENOUS

## 2016-09-04 MED ORDER — VERAPAMIL HCL 2.5 MG/ML IV SOLN
INTRAVENOUS | Status: AC
  Filled 2016-09-04: qty 2

## 2016-09-04 MED ORDER — HEPARIN (PORCINE) IN NACL 2-0.9 UNIT/ML-% IJ SOLN
INTRAMUSCULAR | Status: DC | PRN
  Administered 2016-09-04: 1000 mL

## 2016-09-04 MED ORDER — LIDOCAINE HCL (PF) 1 % IJ SOLN
INTRAMUSCULAR | Status: DC | PRN
  Administered 2016-09-04: 13 mL
  Administered 2016-09-04 (×2): 2 mL

## 2016-09-04 MED ORDER — SODIUM CHLORIDE 0.9 % WEIGHT BASED INFUSION: 1.0000 mL/kg/h | INTRAVENOUS | Status: DC

## 2016-09-04 MED ORDER — HEPARIN (PORCINE) IN NACL 2-0.9 UNIT/ML-% IJ SOLN
INTRAMUSCULAR | Status: AC
  Filled 2016-09-04: qty 1000

## 2016-09-04 SURGICAL SUPPLY — 13 items
CATH IMPULSE 5F ANG/FL3.5 (CATHETERS) ×3 IMPLANT
CATH SWAN GANZ 7F STRAIGHT (CATHETERS) ×3 IMPLANT
DEVICE RAD COMP TR BAND LRG (VASCULAR PRODUCTS) ×3 IMPLANT
GLIDESHEATH SLEND SS 6F .021 (SHEATH) ×3 IMPLANT
GUIDEWIRE INQWIRE 1.5J.035X260 (WIRE) ×1 IMPLANT
INQWIRE 1.5J .035X260CM (WIRE) ×3
KIT HEART LEFT (KITS) ×3 IMPLANT
PACK CARDIAC CATHETERIZATION (CUSTOM PROCEDURE TRAY) ×3 IMPLANT
SHEATH FAST CATH BRACH 5F 5CM (SHEATH) ×3 IMPLANT
SHEATH PINNACLE 7F 10CM (SHEATH) ×3 IMPLANT
SYR MEDRAD MARK V 150ML (SYRINGE) ×3 IMPLANT
TRANSDUCER W/STOPCOCK (MISCELLANEOUS) ×3 IMPLANT
TUBING CIL FLEX 10 FLL-RA (TUBING) ×3 IMPLANT

## 2016-09-04 NOTE — Interval H&P Note (Signed)
History and Physical Interval Note:  09/04/2016 3:44 PM  [name]  has presented today for surgery, with the diagnosis of shortness of breath  The various methods of treatment have been discussed with the patient and family. After consideration of risks, benefits and other options for treatment, the patient has consented to  Procedure(s): Right/Left Heart Cath and Coronary Angiography (N/A) as a surgical intervention .  The patient's history has been reviewed, patient examined, no change in status, stable for surgery.  I have reviewed the patient's chart and labs.  Questions were answered to the patient's satisfaction.   Cath Lab Visit (complete for each Cath Lab visit)  Clinical Evaluation Leading to the Procedure:   ACS: No.  Non-ACS:    Anginal Classification: CCS III  Anti-ischemic medical therapy: No Therapy  Non-Invasive Test Results: No non-invasive testing performed  Prior CABG: No previous CABG        [facility] 09/04/2016 3:44 PM

## 2016-09-04 NOTE — H&P (View-Only) (Signed)
.0-   Cardiology Office Note   Date:  09/03/2016   ID:  [name], DOB 1955-11-19, MRN [id number]  PCP:  Alonza Bogus, MD  Cardiologist:   Dorris Carnes, MD   Pt referred for CP     History of Present Illness: Christopher [name] is a 61 y.o. male with a history of HTN, COPD, sleep apnea, tob use,  Seen by Susann Givens for CP  Not exertional  CT scan showed coroary calcifications    Pateint will have ice pick sensations in chest  COme and go  Takes aspirin Then chest can feel like going to blow up    Spells last 30 min   WIth activity worse  Then stop Going on for a few months   Cut back on acitvity    PT says he has had cath in past but I find no records of this  Had stress test in past that was normal    Current Meds  Medication Sig  . ALPRAZolam (XANAX) 0.5 MG tablet Take 0.5 mg by mouth 2 (two) times daily.   Marland Kitchen atorvastatin (LIPITOR) 20 MG tablet Take 20 mg by mouth.   . dicyclomine (BENTYL) 10 MG capsule TAKE 1 CAPSULE THREE TIMES DAILY BETWEEN MEALS  . HYDROcodone-acetaminophen (NORCO) 10-325 MG per tablet Take 1 tablet by mouth every 4 (four) hours as needed.  . pantoprazole (PROTONIX) 40 MG tablet Take 40 mg by mouth daily.   . QUEtiapine (SEROQUEL) 50 MG tablet Take 50 mg by mouth at bedtime.  . sertraline (ZOLOFT) 50 MG tablet Take 50 mg by mouth daily.   . temazepam (RESTORIL) 30 MG capsule Take 1 capsule by mouth at bedtime.     Allergies:   Celebrex [celecoxib]; Codeine; Doxycycline; Cortisone; Oxycodone-acetaminophen; Prednisone; and Relafen [nabumetone]   Past Medical History:  Diagnosis Date  . Anxiety   . Arthritis   . Complication of anesthesia    pt had a hard time being able to move after spinal anesthesia , 3-4 hours  . Depression   . Diabetes mellitus without complication ([place])    pt sts borderline  . GERD (gastroesophageal reflux disease)   . Headache(784.0)    after surgery  . Heart murmur    Years ago- not now  . Hyperlipidemia   .  PONV (postoperative nausea and vomiting)   . Sleep apnea    uses CIPAP machine at night    Past Surgical History:  Procedure Laterality Date  . APPENDECTOMY    . BACK SURGERY     neck and back fusion  . BIOPSY  12/27/2015   Procedure: BIOPSY;  Surgeon: Rogene Houston, MD;  [facility];  Service: Endoscopy;;  Fundus biopsies and duodenal biopsies  . CARDIAC CATHETERIZATION    . CHONDROPLASTY  08/15/2011   Procedure: CHONDROPLASTY;  Surgeon: [name], MD;  [facility];  Service: Orthopedics;  Laterality: Left;  . COLONOSCOPY  06/27/2011   Procedure: COLONOSCOPY;  Surgeon: Rogene Houston, MD;  [facility];  Service: Endoscopy;  Laterality: N/A;  9:00 / Pt to be here at 9am for 10:45 procedure, benign polyps removed  . COLONOSCOPY N/A 10/05/2014   Procedure: COLONOSCOPY;  Surgeon: Rogene Houston, MD;  [facility];  Service: Endoscopy;  Laterality: N/A;  930  . ESOPHAGOGASTRODUODENOSCOPY N/A 12/27/2015   Procedure: ESOPHAGOGASTRODUODENOSCOPY (EGD);  Surgeon: Rogene Houston, MD;  [facility];  Service: Endoscopy;  Laterality: N/A;  3:00  . HERNIA REPAIR     umbilical hernia  . KNEE ARTHROSCOPY     left knee  . KNEE ARTHROSCOPY     right knee   . LUMBAR LAMINECTOMY/DECOMPRESSION MICRODISCECTOMY  09/14/2012   Procedure: LUMBAR LAMINECTOMY/DECOMPRESSION MICRODISCECTOMY 1 LEVEL;  Surgeon: Floyce Stakes, MD;  Location: [place] [facility];  Service: Neurosurgery;  Laterality: Right;  Right Lumbar three-four Diskectomy  . neck fusion    . SHOULDER ARTHROSCOPY WITH BICEPSTENOTOMY Left 09/23/2013   Procedure: SHOULDER ARTHROSCOPY WITH BICEPSTENOTOMY AND EXTENSIVE DEBRIDEMENT;  Surgeon: Carole Civil, MD;  [facility];  Service: Orthopedics;  Laterality: Left;  . SHOULDER ARTHROSCOPY WITH ROTATOR CUFF REPAIR Left 05/05/2014   Procedure: SHOULDER ARTHROSCOPY LIMITED DEBRIDEMENT;  Surgeon: Carole Civil, MD;  [facility];   Service: Orthopedics;  Laterality: Left;  . SHOULDER OPEN ROTATOR CUFF REPAIR Left 05/05/2014   Procedure: ROTATOR CUFF REPAIR SHOULDER OPEN;  Surgeon: Carole Civil, MD;  Location[facility];  Service: Orthopedics;  Laterality: Left;  . SHOULDER SURGERY Right    Open Mumford procedure  . SPINAL FUSION     x 2     Social History:  The patient  reports that he has been smoking Cigarettes.  He has a 41.00 pack-year smoking history. He has never used smokeless tobacco. He reports that he does not drink alcohol or use drugs.  1 ppd    Family History:  The patient's family history is not on file.  Mother had stents  Alos had PVOD    Dad with DM      ROS:  Please see the history of present illness. All other systems are reviewed and  Negative to the above problem except as noted.    PHYSICAL EXAM: VS:  BP 122/64   Pulse 77   Ht 5\' 6"  (1.676 m)   Wt 235 lb (106.6 kg)   SpO2 98%   BMI 37.93 kg/m   GEN:  Morbidly obese 61 yo in no acute distress  HEENT: normal  Neck: no JVD, carotid bruits, or masses Cardiac: RRR; no murmurs, rubs, or gallops,no edema  Respiratory:  Decreased airflow   GI: soft, mild diffuse tendrness  Mild ventral hernia , + BS  No hepatomegaly  MS: no deformity Moving all extremities   Skin: warm and dry, no rash Neuro:  Strength and sensation are intact Psych: euthymic mood, full affect   EKG:  EKG is ordered today.  SB 58 bpm  Incomplete RBBB     Lipid Panel No results found for: CHOL, TRIG, HDL, CHOLHDL, VLDL, LDLCALC, LDLDIRECT    Wt Readings from Last 3 Encounters:  09/03/16 235 lb (106.6 kg)  12/27/15 239 lb (108.4 kg)  12/19/15 239 lb 3.2 oz (108.5 kg)      ASSESSMENT AND PLAN:  1  Chest pressure / chest pain  / SOB  Some is atypcial but some is very concerning for angina  CAD noted on CT scan I have discussed with pt  I would reocmm R and L heart cath to define anatomy, pressuress (pt and wife said he feels very full, distended , sOB at  times) Risk/benefits dscribed  Pt understands and agrees to proceed  WIll plan on tomorrow Rx NTG prn  2  HTN  FOllow    3  Cont lipitor  4  Tob  Counsedlled on cessation  Has evid of COPD on exam     Current medicines are reviewed at length with  the patient today.  The patient does not have concerns regarding medicines.  Signed, Dorris Carnes, MD  09/03/2016 1:35 PM    [facility] [place], [place], [place]  [address] Phone: [phone]; Fax: [phone]

## 2016-09-04 NOTE — Discharge Instructions (Signed)

## 2016-09-04 NOTE — Progress Notes (Signed)
Site area: Right groin a 7 french arterial sheath was removed  Site Prior to Removal:  Level 0  Pressure Applied For 15 MINUTES    Bedrest Beginning at 1700p  Manual:   Yes.    Patient Status During Pull:  stable  Post Pull Groin Site:  Level 0  Post Pull Instructions Given:  Yes.    Post Pull Pulses Present:  Yes.    Dressing Applied:  Yes.    Comments:  VS remain stable during sheath pull

## 2016-09-05 ENCOUNTER — Encounter (HOSPITAL_COMMUNITY): Payer: Self-pay | Admitting: Cardiology

## 2016-09-29 ENCOUNTER — Ambulatory Visit (INDEPENDENT_AMBULATORY_CARE_PROVIDER_SITE_OTHER): Payer: PPO | Admitting: Orthopedic Surgery

## 2016-09-29 ENCOUNTER — Ambulatory Visit (INDEPENDENT_AMBULATORY_CARE_PROVIDER_SITE_OTHER): Payer: PPO

## 2016-09-29 ENCOUNTER — Encounter: Payer: Self-pay | Admitting: Orthopedic Surgery

## 2016-09-29 VITALS — BP 141/80 | HR 63 | Wt 245.0 lb

## 2016-09-29 DIAGNOSIS — M1712 Unilateral primary osteoarthritis, left knee: Secondary | ICD-10-CM | POA: Diagnosis not present

## 2016-09-29 DIAGNOSIS — M25562 Pain in left knee: Secondary | ICD-10-CM | POA: Diagnosis not present

## 2016-09-29 DIAGNOSIS — Z9889 Other specified postprocedural states: Secondary | ICD-10-CM | POA: Diagnosis not present

## 2016-09-29 MED ORDER — IBUPROFEN 800 MG PO TABS: 800.0000 mg | ORAL_TABLET | Freq: Three times a day (TID) | ORAL | 0 refills | Status: DC | PRN

## 2016-09-29 MED ORDER — HYDROCODONE-ACETAMINOPHEN 10-325 MG PO TABS: 1.0000 | ORAL_TABLET | Freq: Four times a day (QID) | ORAL | 0 refills | Status: DC | PRN

## 2016-09-29 NOTE — Patient Instructions (Signed)

## 2016-09-29 NOTE — Progress Notes (Signed)
Patient ID: Christopher [name], male   DOB: 09-10-1955, 61 y.o.   MRN: [id number]  Chief Complaint  Patient presents with  . Knee Pain    LEFT KNEE PAIN    HPI:61 years old male previous knee arthroscopy presents with two-month history of pain and swelling decreased range of motion tightness constant severe pain in his left knee unrelieved by ibuprofen  RONo chest pain or shortness of breath reported   Past Medical History:  Diagnosis Date  . Anxiety   . Arthritis   . Complication of anesthesia    pt had a hard time being able to move after spinal anesthesia , 3-4 hours  . Depression   . Diabetes mellitus without complication ([place])    pt sts borderline  . GERD (gastroesophageal reflux disease)   . Headache(784.0)    after surgery  . Heart murmur    Years ago- not now  . Hyperlipidemia   . PONV (postoperative nausea and vomiting)   . Sleep apnea    uses CIPAP machine at night    PHYSICAL EXAM  BP (!) 141/80   Pulse 63   Wt 245 lb (111.1 kg)   BMI 39.54 kg/m  GENERAL appearance reveals no gross abnormalities  MENTAL STATUS we note that the patient is awake alert and oriented to person place and time MOOD/AFFECT ARE NORMAL   GAIT reAbnormal gait pattern with a slight limp  EXAM OF THLEFT KNEE SKIN no erythema lacerations or ecchymosis  INMedial joint line tenderness with effusion ROM   0-1 25 with pain Knee was stable MOTOR GRADE 5/5    examination of the right kneeno tenderness no swelling   VASC 2+ dorsalis pedis pulse normal capillary refill excellent warmth to the extremity  NEURO sensation and no pathologic reflexes  LYMPH deferred noncontributory   IMAGING STUDIES  I have independently reviewed the x-rays and I interpreted the x-rays as follgrade 4 arthritis medial compartment Dx   primary osteoarthritis of the left knee with effusion of left knee  PLAN  Aspiration injection  Procedure note injection and aspiration left knee joint  Verbal  consent was obtained to aspirate and inject the left knee joint   Timeout was completed to confirm the site of aspiration and injection  An 18-gauge needle was used to aspirate the left knee joint from a suprapatellar lateral approach.  The medications used were 40 mg of Depo-Medrol and 1% lidocaine 3 cc  Anesthesia was provided by ethyl chloride and the skin was prepped with alcohol.  After cleaning the skin with alcohol an 18-gauge needle was used to aspirate the right knee joint.  We obtained 15 cc of fluid  We follow this by injection of 40 mg of Depo-Medrol and 3 cc 1% lidocaine.  There were no complications. A sterile bandage was applied.  [place] controlled substance reporting system reviewed Meds ordered this encounter  Medications  . HYDROcodone-acetaminophen (NORCO) 10-325 MG tablet    Sig: Take 1 tablet by mouth every 6 (six) hours as needed.    Dispense:  30 tablet    Refill:  0     3:22 PM [name], MD 09/29/2016

## 2016-10-29 DIAGNOSIS — G4733 Obstructive sleep apnea (adult) (pediatric): Secondary | ICD-10-CM | POA: Diagnosis not present

## 2016-10-29 DIAGNOSIS — M25562 Pain in left knee: Secondary | ICD-10-CM | POA: Diagnosis not present

## 2016-10-29 DIAGNOSIS — J449 Chronic obstructive pulmonary disease, unspecified: Secondary | ICD-10-CM | POA: Diagnosis not present

## 2016-10-29 DIAGNOSIS — I10 Essential (primary) hypertension: Secondary | ICD-10-CM | POA: Diagnosis not present

## 2016-11-07 ENCOUNTER — Ambulatory Visit (HOSPITAL_COMMUNITY)
Admission: RE | Admit: 2016-11-07 | Discharge: 2016-11-07 | Disposition: A | Discharge status: Home / Self Care | Payer: PPO | Source: Ambulatory Visit | Attending: Pulmonary Disease | Admitting: Pulmonary Disease

## 2016-11-07 DIAGNOSIS — R942 Abnormal results of pulmonary function studies: Secondary | ICD-10-CM | POA: Insufficient documentation

## 2016-11-07 DIAGNOSIS — J441 Chronic obstructive pulmonary disease with (acute) exacerbation: Secondary | ICD-10-CM | POA: Insufficient documentation

## 2016-11-07 DIAGNOSIS — F17210 Nicotine dependence, cigarettes, uncomplicated: Secondary | ICD-10-CM | POA: Insufficient documentation

## 2016-11-07 MED ORDER — ALBUTEROL SULFATE (2.5 MG/3ML) 0.083% IN NEBU
2.5000 mg | INHALATION_SOLUTION | Freq: Once | RESPIRATORY_TRACT | Status: AC
  Administered 2016-11-07: 2.5 mg via RESPIRATORY_TRACT

## 2016-11-12 LAB — PULMONARY FUNCTION TEST
DL/VA % PRED: 78 %
DL/VA: 3.43 ml/min/mmHg/L
DLCO COR: 16.27 ml/min/mmHg
DLCO cor % pred: 60 %
DLCO unc % pred: 60 %
DLCO unc: 16.27 ml/min/mmHg
FEF 25-75 Post: 2.61 L/sec
FEF 25-75 Pre: 2.14 L/sec
FEF2575-%CHANGE-POST: 21 %
FEF2575-%PRED-PRE: 80 %
FEF2575-%Pred-Post: 97 %
FEV1-%Change-Post: 3 %
FEV1-%PRED-PRE: 73 %
FEV1-%Pred-Post: 75 %
FEV1-POST: 2.4 L
FEV1-PRE: 2.32 L
FEV1FVC-%Change-Post: -2 %
FEV1FVC-%Pred-Pre: 103 %
FEV6-%CHANGE-POST: 6 %
FEV6-%PRED-PRE: 74 %
FEV6-%Pred-Post: 78 %
FEV6-POST: 3.11 L
FEV6-PRE: 2.93 L
FEV6FVC-%Change-Post: 0 %
FEV6FVC-%PRED-POST: 105 %
FEV6FVC-%PRED-PRE: 104 %
FVC-%CHANGE-POST: 5 %
FVC-%PRED-POST: 75 %
FVC-%PRED-PRE: 70 %
FVC-POST: 3.11 L
FVC-PRE: 2.94 L
POST FEV6/FVC RATIO: 100 %
PRE FEV1/FVC RATIO: 79 %
Post FEV1/FVC ratio: 77 %
Pre FEV6/FVC Ratio: 100 %
RV % PRED: 123 %
RV: 2.47 L
TLC % PRED: 88 %
TLC: 5.5 L

## 2017-01-28 ENCOUNTER — Telehealth: Payer: Self-pay | Admitting: Orthopedic Surgery

## 2017-01-28 NOTE — Telephone Encounter (Signed)
Patient left message; inquired about scheduling appointment for "draining" of knee, as well as possibly discussing surgery. Called back to offer appointment.  Reached voice mail, left message.

## 2017-01-29 NOTE — Telephone Encounter (Signed)
Patient (spouse) returned call; appointment has been scheduled.

## 2017-02-09 ENCOUNTER — Ambulatory Visit: Payer: Self-pay | Admitting: Orthopedic Surgery

## 2017-03-14 ENCOUNTER — Emergency Department (HOSPITAL_COMMUNITY): Payer: PPO

## 2017-03-14 ENCOUNTER — Emergency Department (HOSPITAL_COMMUNITY)
Admission: EM | Admit: 2017-03-14 | Discharge: 2017-03-14 | Disposition: A | Discharge status: Home / Self Care | Payer: PPO | Attending: Emergency Medicine | Admitting: Emergency Medicine

## 2017-03-14 ENCOUNTER — Encounter (HOSPITAL_COMMUNITY): Payer: Self-pay | Admitting: Emergency Medicine

## 2017-03-14 DIAGNOSIS — J449 Chronic obstructive pulmonary disease, unspecified: Secondary | ICD-10-CM | POA: Insufficient documentation

## 2017-03-14 DIAGNOSIS — Z79899 Other long term (current) drug therapy: Secondary | ICD-10-CM | POA: Diagnosis not present

## 2017-03-14 DIAGNOSIS — S99912A Unspecified injury of left ankle, initial encounter: Secondary | ICD-10-CM | POA: Diagnosis present

## 2017-03-14 DIAGNOSIS — Y939 Activity, unspecified: Secondary | ICD-10-CM | POA: Insufficient documentation

## 2017-03-14 DIAGNOSIS — S91012A Laceration without foreign body, left ankle, initial encounter: Secondary | ICD-10-CM | POA: Diagnosis not present

## 2017-03-14 DIAGNOSIS — F17210 Nicotine dependence, cigarettes, uncomplicated: Secondary | ICD-10-CM | POA: Insufficient documentation

## 2017-03-14 DIAGNOSIS — Z23 Encounter for immunization: Secondary | ICD-10-CM | POA: Insufficient documentation

## 2017-03-14 DIAGNOSIS — Y999 Unspecified external cause status: Secondary | ICD-10-CM | POA: Diagnosis not present

## 2017-03-14 DIAGNOSIS — W228XXA Striking against or struck by other objects, initial encounter: Secondary | ICD-10-CM | POA: Diagnosis not present

## 2017-03-14 DIAGNOSIS — Y929 Unspecified place or not applicable: Secondary | ICD-10-CM | POA: Insufficient documentation

## 2017-03-14 MED ORDER — TETANUS-DIPHTH-ACELL PERTUSSIS 5-2.5-18.5 LF-MCG/0.5 IM SUSP
0.5000 mL | Freq: Once | INTRAMUSCULAR | Status: AC
  Administered 2017-03-14: 0.5 mL via INTRAMUSCULAR
  Filled 2017-03-14: qty 0.5

## 2017-03-14 MED ORDER — CEPHALEXIN 500 MG PO CAPS
500.0000 mg | ORAL_CAPSULE | Freq: Once | ORAL | Status: AC
  Administered 2017-03-14: 500 mg via ORAL
  Filled 2017-03-14: qty 1

## 2017-03-14 MED ORDER — BACITRACIN ZINC 500 UNIT/GM EX OINT
TOPICAL_OINTMENT | CUTANEOUS | Status: AC
  Filled 2017-03-14: qty 0.9

## 2017-03-14 MED ORDER — CEPHALEXIN 500 MG PO CAPS: 500.0000 mg | ORAL_CAPSULE | Freq: Four times a day (QID) | ORAL | 0 refills | Status: DC

## 2017-03-14 MED ORDER — LIDOCAINE HCL (PF) 1 % IJ SOLN: 10.0000 mL | Freq: Once | INTRAMUSCULAR | Status: DC

## 2017-03-14 MED ORDER — LIDOCAINE HCL (PF) 1 % IJ SOLN
INTRAMUSCULAR | Status: AC
  Filled 2017-03-14: qty 10

## 2017-03-14 MED ORDER — TRAMADOL HCL 50 MG PO TABS: 50.0000 mg | ORAL_TABLET | Freq: Four times a day (QID) | ORAL | 0 refills | Status: DC | PRN

## 2017-03-14 NOTE — ED Notes (Signed)
Cleaned Up Laceration with NS

## 2017-03-14 NOTE — ED Triage Notes (Signed)
Pt was moving trailer and dropped on left heel.  Lac to left heel

## 2017-03-14 NOTE — ED Provider Notes (Signed)
[facility] DEPT Provider Note   CSN: [id number] Arrival date & time: 03/14/17  2045     History   Chief Complaint Chief Complaint  Patient presents with  . Laceration    HPI Christopher [name] is a 61 y.o. male who presents to the ED with laceration of the back of the left ankle that occurred 2 hours ago. He reports that he was moving a trailer and when he put it down the weight of the trailer fell on his ankle. He reports pain. Last tetanus unknown.   HPI  Past Medical History:  Diagnosis Date  . Anxiety   . Arthritis   . Complication of anesthesia    pt had a hard time being able to move after spinal anesthesia , 3-4 hours  . Depression   . Diabetes mellitus without complication ([place])    pt sts borderline  . GERD (gastroesophageal reflux disease)   . Headache(784.0)    after surgery  . Heart murmur    Years ago- not now  . Hyperlipidemia   . PONV (postoperative nausea and vomiting)   . Sleep apnea    uses CIPAP machine at night    Patient Active Problem List   Diagnosis Date Noted  . Hyperlipidemia 09/04/2016  . Angina pectoris ([place]) 09/04/2016  . OSA (obstructive sleep apnea) 09/04/2016  . COPD (chronic obstructive pulmonary disease) ([place]) 09/04/2016  . Rotator cuff tear 05/05/2014  . S/P shoulder surgery 09/26/2013  . Arthritis, shoulder region 09/26/2013  . Bursitis, shoulder 09/26/2013  . Synovitis of shoulder 09/26/2013  . Labral tear of shoulder, degenerative 09/26/2013  . Biceps tendon tear 09/26/2013  . Rotator cuff syndrome of left shoulder 06/21/2013  . Arthritis 06/21/2013  . Patellar tendinitis 03/29/2013  . Effusion of knee joint 03/29/2013  . Bursitis/tendonitis, shoulder 03/10/2013  . Effusion of knee joint, left 08/18/2011  . Knee pain 08/18/2011  . Acute torn meniscus 07/30/2011  . Old torn meniscus of knee 07/30/2011  . ARTHRITIS, LEFT KNEE 10/15/2010  . MEDIAL MENISCUS TEAR, RIGHT 10/15/2010  . HIP PAIN 01/15/2010  . DEGENERATIVE  DISC DISEASE, LUMBOSACRAL SPINE W/RADICULOPATHY 01/15/2010  . PLICA SYNDROME 60/06/9322  . DERANGEMENT MENISCUS 07/09/2009  . JOINT EFFUSION, LEFT KNEE 07/09/2009  . KNEE, ARTHRITIS, DEGEN./OSTEO 01/30/2009  . KNEE PAIN 01/30/2009  . ANKLE SPRAIN, RIGHT 11/08/2007    Past Surgical History:  Procedure Laterality Date  . APPENDECTOMY    . BACK SURGERY     neck and back fusion  . BIOPSY  12/27/2015   Procedure: BIOPSY;  Surgeon: Rogene Houston, MD;  [facility];  Service: Endoscopy;;  Fundus biopsies and duodenal biopsies  . CARDIAC CATHETERIZATION    . CARDIAC CATHETERIZATION N/A 09/04/2016   Procedure: Right/Left Heart Cath and Coronary Angiography;  Surgeon: Peter M Martinique, MD;  Location: [facility];  Service: Cardiovascular;  Laterality: N/A;  . CHONDROPLASTY  08/15/2011   Procedure: CHONDROPLASTY;  Surgeon: [name], MD;  Location: [facility];  Service: Orthopedics;  Laterality: Left;  . COLONOSCOPY  06/27/2011   Procedure: COLONOSCOPY;  Surgeon: Rogene Houston, MD;  [facility];  Service: Endoscopy;  Laterality: N/A;  9:00 / Pt to be here at 9am for 10:45 procedure, benign polyps removed  . COLONOSCOPY N/A 10/05/2014   Procedure: COLONOSCOPY;  Surgeon: Rogene Houston, MD;  [facility];  Service: Endoscopy;  Laterality: N/A;  930  . ESOPHAGOGASTRODUODENOSCOPY N/A 12/27/2015   Procedure: ESOPHAGOGASTRODUODENOSCOPY (EGD);  Surgeon: Rogene Houston, MD;  [facility];  Service: Endoscopy;  Laterality: N/A;  3:00  . HERNIA REPAIR     umbilical hernia  . KNEE ARTHROSCOPY     left knee  . KNEE ARTHROSCOPY     right knee   . LUMBAR LAMINECTOMY/DECOMPRESSION MICRODISCECTOMY  09/14/2012   Procedure: LUMBAR LAMINECTOMY/DECOMPRESSION MICRODISCECTOMY 1 LEVEL;  Surgeon: Floyce Stakes, MD;  Location: [facility];  Service: Neurosurgery;  Laterality: Right;  Right Lumbar three-four Diskectomy  . neck fusion    . SHOULDER ARTHROSCOPY  WITH BICEPSTENOTOMY Left 09/23/2013   Procedure: SHOULDER ARTHROSCOPY WITH BICEPSTENOTOMY AND EXTENSIVE DEBRIDEMENT;  Surgeon: Carole Civil, MD;  Location: [facility];  Service: Orthopedics;  Laterality: Left;  . SHOULDER ARTHROSCOPY WITH ROTATOR CUFF REPAIR Left 05/05/2014   Procedure: SHOULDER ARTHROSCOPY LIMITED DEBRIDEMENT;  Surgeon: Carole Civil, MD;  Location: [facility];  Service: Orthopedics;  Laterality: Left;  . SHOULDER OPEN ROTATOR CUFF REPAIR Left 05/05/2014   Procedure: ROTATOR CUFF REPAIR SHOULDER OPEN;  Surgeon: Carole Civil, MD;  Location: [facility];  Service: Orthopedics;  Laterality: Left;  . SHOULDER SURGERY Right    Open Mumford procedure  . SPINAL FUSION     x 2       Home Medications    Prior to Admission medications   Medication Sig Start Date End Date Taking? Authorizing Provider  ALPRAZolam Duanne Moron) 0.5 MG tablet Take 0.5 mg by mouth 2 (two) times daily.     [provider]  atorvastatin (LIPITOR) 20 MG tablet Take 20 mg by mouth every morning.  09/02/16   [provider]  cephALEXin (KEFLEX) 500 MG capsule Take 1 capsule (500 mg total) by mouth 4 (four) times daily. 03/14/17   Ashley Murrain, NP  dicyclomine (BENTYL) 10 MG capsule Take 10 mg by mouth every other day.    [provider]  HYDROcodone-acetaminophen (NORCO) 10-325 MG tablet Take 1 tablet by mouth every 6 (six) hours as needed. 09/29/16   Carole Civil, MD  ibuprofen (ADVIL,MOTRIN) 800 MG tablet Take 1 tablet (800 mg total) by mouth every 8 (eight) hours as needed. 09/29/16   Carole Civil, MD  nitroGLYCERIN (NITROSTAT) 0.4 MG SL tablet Place 1 tablet (0.4 mg total) under the tongue every 5 (five) minutes as needed. 09/03/16   Fay Records, MD  pantoprazole (PROTONIX) 40 MG tablet Take 40 mg by mouth daily.  12/24/10   [provider]  QUEtiapine (SEROQUEL) 50 MG tablet Take 50 mg by mouth at bedtime.    [provider]  sertraline (ZOLOFT) 50 MG  tablet Take 50 mg by mouth daily.  12/24/10   [provider]  temazepam (RESTORIL) 30 MG capsule Take 1 capsule by mouth at bedtime. 12/01/15   [provider]  traMADol (ULTRAM) 50 MG tablet Take 1 tablet (50 mg total) by mouth every 6 (six) hours as needed. 03/14/17   Ashley Murrain, NP    Family History Family History  Problem Relation Age of Onset  . Diabetes Unknown   . Lung disease Unknown   . Arthritis Unknown   . Anesthesia problems Neg Hx   . Hypotension Neg Hx   . Malignant hyperthermia Neg Hx   . Pseudochol deficiency Neg Hx     Social History Social History  Substance Use Topics  . Smoking status: Current Every Day Smoker    Packs/day: 1.00    Years: 41.00    Types:  Cigarettes  . Smokeless tobacco: Never Used     Comment: smokes a pack a day. since age 82  . Alcohol use No     Allergies   Celebrex [celecoxib]; Codeine; Doxycycline; Cortisone; Oxycodone-acetaminophen; Prednisone; and Relafen [nabumetone]   Review of Systems Review of Systems  Musculoskeletal: Negative for back pain.  Skin: Positive for color change and wound.  Neurological: Negative for syncope.     Physical Exam Updated Vital Signs BP (!) 162/76 (BP Location: Right Arm)   Pulse 70   Temp 98.4 F (36.9 C) (Oral)   Resp 18   Ht 5\' 6"  (1.676 m)   Wt 102.1 kg (225 lb)   SpO2 98%   BMI 36.32 kg/m   Physical Exam  Constitutional: He appears well-developed and well-nourished. No distress.  Eyes: EOM are normal.  Neck: Neck supple.  Cardiovascular: Normal rate.   Pulmonary/Chest: Effort normal.  Abdominal: Soft. There is no tenderness.  Musculoskeletal:       Left ankle: He exhibits laceration. He exhibits normal range of motion and normal pulse. Tenderness. Achilles tendon exhibits pain. Achilles tendon exhibits no defect and normal Thompson's test results.  5 cm flap, V shaped laceration to the posterior aspect of the left ankle.   Neurological: He is alert.  Skin:  Skin is warm and dry.  Psychiatric: He has a normal mood and affect.  Nursing note and vitals reviewed.    ED Treatments / Results  Labs (all labs ordered are listed, but only abnormal results are displayed) Labs Reviewed - No data to display Radiology Dg Ankle Complete Left  Result Date: 03/14/2017 CLINICAL DATA:  61 year old male with left ankle laceration EXAM: LEFT ANKLE COMPLETE - 3+ VIEW COMPARISON:  None. FINDINGS: There is no acute fracture or dislocation. The bones are well mineralized. No arthritic changes. The ankle mortise is intact. There is skin laceration of the posterior ankle approximately 1 cm above the Achilles insertion. No radiopaque foreign object. IMPRESSION: 1. No acute fracture or dislocation. 2. Laceration of the skin of the posterior ankle. No radiopaque foreign object. Electronically Signed   By: Anner Crete M.D.   On: 03/14/2017 21:37    Procedures .Marland KitchenLaceration Repair Date/Time: 03/14/2017 9:30 PM Performed by: Ashley Murrain Authorized by: Ashley Murrain   Consent:    Consent obtained:  Verbal   Consent given by:  Patient   Risks discussed:  Infection and poor wound healing   Alternatives discussed:  No treatment Anesthesia (see MAR for exact dosages):    Anesthesia method:  Local infiltration   Local anesthetic:  Lidocaine 1% w/o epi Laceration details:    Location:  Foot   Foot location:  L ankle   Length (cm):  5 Repair type:    Repair type:  Simple Pre-procedure details:    Preparation:  Imaging obtained to evaluate for foreign bodies and patient was prepped and draped in usual sterile fashion Exploration:    Hemostasis achieved with:  Direct pressure   Wound exploration: wound explored through full range of motion and entire depth of wound probed and visualized     Wound extent: no foreign bodies/material noted, no tendon damage noted, no underlying fracture noted and no vascular damage noted   Treatment:    Area cleansed with:  Betadine  and saline   Amount of cleaning:  Extensive   Irrigation solution:  Sterile saline   Irrigation method:  Syringe Skin repair:    Repair method:  Sutures Approximation:  Approximation:  Loose Post-procedure details:    Dressing:  Antibiotic ointment, sterile dressing and non-adherent dressing   Patient tolerance of procedure:  Tolerated well, no immediate complications Comments:     Tetanus updated. Antibiotics started. Pain management.   (including critical care time)  Medications Ordered in ED Medications  Tdap (BOOSTRIX) injection 0.5 mL (0.5 mLs Intramuscular Given 03/14/17 2217)  cephALEXin (KEFLEX) capsule 500 mg (500 mg Oral Given 03/14/17 2248)     Initial Impression / Assessment and Plan / ED Course  I have reviewed the triage vital signs and the nursing notes.   Final Clinical Impressions(s) / ED Diagnoses  61 y.o. male with laceration to the posterior aspect of the left ankle stable for d/c without acute findings on x-ray and normal function of achilles. Will start antibiotics and he will f/u with his PCP in 7-10 days for suture removal. He will return sooner for any problems.  Final diagnoses:  Laceration of left ankle, initial encounter    New Prescriptions Discharge Medication List as of 03/14/2017 10:54 PM    START taking these medications   Details  cephALEXin (KEFLEX) 500 MG capsule Take 1 capsule (500 mg total) by mouth 4 (four) times daily., Starting Sat 03/14/2017, Print    traMADol (ULTRAM) 50 MG tablet Take 1 tablet (50 mg total) by mouth every 6 (six) hours as needed., Starting Sat 03/14/2017, Print         Hodgen, [place], NP 03/15/17 Carlin, Isabella, DO 03/18/17 [phone]

## 2017-03-14 NOTE — ED Notes (Signed)
Pt back from x-ray.

## 2017-03-14 NOTE — Discharge Instructions (Signed)
Follow up with your doctor in 7 to 10 days for suture removal. Return here sooner for any problems.

## 2017-03-25 DIAGNOSIS — M545 Low back pain: Secondary | ICD-10-CM | POA: Diagnosis not present

## 2017-03-25 DIAGNOSIS — G4733 Obstructive sleep apnea (adult) (pediatric): Secondary | ICD-10-CM | POA: Diagnosis not present

## 2017-03-25 DIAGNOSIS — I10 Essential (primary) hypertension: Secondary | ICD-10-CM | POA: Diagnosis not present

## 2017-03-25 DIAGNOSIS — J449 Chronic obstructive pulmonary disease, unspecified: Secondary | ICD-10-CM | POA: Diagnosis not present

## 2017-05-12 DIAGNOSIS — Z23 Encounter for immunization: Secondary | ICD-10-CM | POA: Diagnosis not present

## 2017-05-12 DIAGNOSIS — Z0000 Encounter for general adult medical examination without abnormal findings: Secondary | ICD-10-CM | POA: Diagnosis not present

## 2017-05-13 DIAGNOSIS — K760 Fatty (change of) liver, not elsewhere classified: Secondary | ICD-10-CM | POA: Diagnosis not present

## 2017-05-13 DIAGNOSIS — J449 Chronic obstructive pulmonary disease, unspecified: Secondary | ICD-10-CM | POA: Diagnosis not present

## 2017-05-13 DIAGNOSIS — F17200 Nicotine dependence, unspecified, uncomplicated: Secondary | ICD-10-CM | POA: Diagnosis not present

## 2017-05-13 DIAGNOSIS — Z0000 Encounter for general adult medical examination without abnormal findings: Secondary | ICD-10-CM | POA: Diagnosis not present

## 2017-05-13 DIAGNOSIS — E785 Hyperlipidemia, unspecified: Secondary | ICD-10-CM | POA: Diagnosis not present

## 2017-05-13 DIAGNOSIS — N400 Enlarged prostate without lower urinary tract symptoms: Secondary | ICD-10-CM | POA: Diagnosis not present

## 2017-05-13 DIAGNOSIS — G4730 Sleep apnea, unspecified: Secondary | ICD-10-CM | POA: Diagnosis not present

## 2017-05-13 DIAGNOSIS — K580 Irritable bowel syndrome with diarrhea: Secondary | ICD-10-CM | POA: Diagnosis not present

## 2017-05-13 DIAGNOSIS — I10 Essential (primary) hypertension: Secondary | ICD-10-CM | POA: Diagnosis not present

## 2017-05-13 DIAGNOSIS — Z125 Encounter for screening for malignant neoplasm of prostate: Secondary | ICD-10-CM | POA: Diagnosis not present

## 2017-05-18 DIAGNOSIS — Z1211 Encounter for screening for malignant neoplasm of colon: Secondary | ICD-10-CM | POA: Diagnosis not present

## 2017-06-01 ENCOUNTER — Encounter: Payer: Self-pay | Admitting: *Deleted

## 2017-06-01 ENCOUNTER — Encounter: Payer: Self-pay | Admitting: Orthopedic Surgery

## 2017-06-01 ENCOUNTER — Ambulatory Visit (INDEPENDENT_AMBULATORY_CARE_PROVIDER_SITE_OTHER): Payer: PPO | Admitting: Orthopedic Surgery

## 2017-06-01 VITALS — BP 118/69 | HR 68 | Ht 66.0 in | Wt 216.0 lb

## 2017-06-01 DIAGNOSIS — M1712 Unilateral primary osteoarthritis, left knee: Secondary | ICD-10-CM | POA: Diagnosis not present

## 2017-06-01 NOTE — Patient Instructions (Addendum)
Steps to Quit Smoking Smoking tobacco can be bad for your health. It can also affect almost every organ in your body. Smoking puts you and people around you at risk for many serious Christopher [name]-lasting (chronic) diseases. Quitting smoking is hard, but it is one of the best things that you can do for your health. It is never too late to quit. What are the benefits of quitting smoking? When you quit smoking, you lower your risk for getting serious diseases and conditions. They can include:  Lung cancer or lung disease.  Heart disease.  Stroke.  Heart attack.  Not being able to have children (infertility).  Weak bones (osteoporosis) and broken bones (fractures).  If you have coughing, wheezing, and shortness of breath, those symptoms may get better when you quit. You may also get sick less often. If you are pregnant, quitting smoking can help to lower your chances of having a baby of low birth weight. What can I do to help me quit smoking? Talk with your doctor about what can help you quit smoking. Some things you can do (strategies) include:  Quitting smoking totally, instead of slowly cutting back how much you smoke over a period of time.  Going to in-person counseling. You are more likely to quit if you go to many counseling sessions.  Using resources and support systems, such as: ? Online chats with a counselor. ? Phone quitlines. ? Printed self-help materials. ? Support groups or group counseling. ? Text messaging programs. ? Mobile phone apps or applications.  Taking medicines. Some of these medicines may have nicotine in them. If you are pregnant or breastfeeding, do not take any medicines to quit smoking unless your doctor says it is okay. Talk with your doctor about counseling or other things that can help you.  Talk with your doctor about using more than one strategy at the same time, such as taking medicines while you are also going to in-person counseling. This can help make  quitting easier. What things can I do to make it easier to quit? Quitting smoking might feel very hard at first, but there is a lot that you can do to make it easier. Take these steps:  Talk to your family and friends. Ask them to support and encourage you.  Call phone quitlines, reach out to support groups, or work with a counselor.  Ask people who smoke to not smoke around you.  Avoid places that make you want (trigger) to smoke, such as: ? Bars. ? Parties. ? Smoke-break areas at work.  Spend time with people who do not smoke.  Lower the stress in your life. Stress can make you want to smoke. Try these things to help your stress: ? Getting regular exercise. ? Deep-breathing exercises. ? Yoga. ? Meditating. ? Doing a body scan. To do this, close your eyes, focus on one area of your body at a time from head to toe, and notice which parts of your body are tense. Try to relax the muscles in those areas.  Download or buy apps on your mobile phone or tablet that can help you stick to your quit plan. There are many free apps, such as QuitGuide from the CDC (Centers for Disease Control and Prevention). You can find more support from smokefree.gov and other websites.  This information is not intended to replace advice given to you by your health care provider. Make sure you discuss any questions you have with your health care provider. Document Released: 06/14/2009 Document   Revised: 04/15/2016 Document Reviewed: 01/02/2015 Elsevier Interactive Patient Education  2018 [place] have decided to proceed with knee replacement surgery. You have decided not to continue with nonoperative measures such as but not limited to oral medication, weight loss, activity modification, physical therapy, bracing, or injection.  We will perform the procedure commonly known as total knee replacement. Some of the risks associated with knee replacement surgery include but are not limited  to Bleeding Infection Swelling Stiffness Blood clot Pain that persists even after surgery  Infection is especially devastating complication of knee surgery although rare. If infection does occur your implant will usually have to be removed and several surgeries and antibiotics will be needed to eradicate the infection prior to performing a repeat replacement.   In some cases amputation is required to eradicate the infection. In other rare cases a knee fusion is needed    If you're not comfortable with these risks and would like to continue with nonoperative treatment please let Dr. Aline Brochure know prior to your surgery.

## 2017-06-01 NOTE — Progress Notes (Addendum)
Patient ID: Christopher [name], male   DOB: 06-Jun-1956, 61 y.o.   MRN: [id number]  Chief Complaint  Patient presents with  . Follow-up    Left knee    HPI Christopher [name] is a 61 y.o. male.  Presents for reevaluation of ongoing pain in his left knee  He's had multiple cortisone injections, he's had oral medication including hydrocodone for pain is used icy hot. His knee pain is getting worse. He complains of a constant dull aching pain on the medial joint line of his left knee with occasional pain in the back of the knee. It should be noted that he has had lumbar disc surgery.  He says he is ready to have something done to his knee. He wants to have knee replacement surgery.  Review of Systems Review of Systems  Respiratory: Negative for wheezing.   Cardiovascular:       Uses nitroglycerin for occasional chest pain  Musculoskeletal: Positive for back pain and gait problem.   (2 MINIMUM)  Past Medical History:  Diagnosis Date  . Anxiety   . Arthritis   . Complication of anesthesia    pt had a hard time being able to move after spinal anesthesia , 3-4 hours  . Depression   . Diabetes mellitus without complication ([place])    pt sts borderline  . GERD (gastroesophageal reflux disease)   . Headache(784.0)    after surgery  . Heart murmur    Years ago- not now  . Hyperlipidemia   . PONV (postoperative nausea and vomiting)   . Sleep apnea    uses CIPAP machine at night    Past Surgical History:  Procedure Laterality Date  . APPENDECTOMY    . BACK SURGERY     neck and back fusion  . BIOPSY  12/27/2015   Procedure: BIOPSY;  Surgeon: Rogene Houston, MD;  [facility];  Service: Endoscopy;;  Fundus biopsies and duodenal biopsies  . CARDIAC CATHETERIZATION    . CARDIAC CATHETERIZATION N/A 09/04/2016   Procedure: Right/Left Heart Cath and Coronary Angiography;  Surgeon: Peter M Martinique, MD;  Location: [facility];  Service: Cardiovascular;  Laterality: N/A;  .  CHONDROPLASTY  08/15/2011   Procedure: CHONDROPLASTY;  Surgeon: [name], MD;  Location: [facility];  Service: Orthopedics;  Laterality: Left;  . COLONOSCOPY  06/27/2011   Procedure: COLONOSCOPY;  Surgeon: Rogene Houston, MD;  [facility];  Service: Endoscopy;  Laterality: N/A;  9:00 / Pt to be here at 9am for 10:45 procedure, benign polyps removed  . COLONOSCOPY N/A 10/05/2014   Procedure: COLONOSCOPY;  Surgeon: Rogene Houston, MD;  [facility];  Service: Endoscopy;  Laterality: N/A;  930  . ESOPHAGOGASTRODUODENOSCOPY N/A 12/27/2015   Procedure: ESOPHAGOGASTRODUODENOSCOPY (EGD);  Surgeon: Rogene Houston, MD;  [facility];  Service: Endoscopy;  Laterality: N/A;  3:00  . HERNIA REPAIR     umbilical hernia  . KNEE ARTHROSCOPY     left knee  . KNEE ARTHROSCOPY     right knee   . LUMBAR LAMINECTOMY/DECOMPRESSION MICRODISCECTOMY  09/14/2012   Procedure: LUMBAR LAMINECTOMY/DECOMPRESSION MICRODISCECTOMY 1 LEVEL;  Surgeon: Floyce Stakes, MD;  Location: [facility];  Service: Neurosurgery;  Laterality: Right;  Right Lumbar three-four Diskectomy  . neck fusion    . SHOULDER ARTHROSCOPY WITH BICEPSTENOTOMY Left 09/23/2013   Procedure: SHOULDER ARTHROSCOPY WITH BICEPSTENOTOMY AND EXTENSIVE DEBRIDEMENT;  Surgeon: Carole Civil, MD;  [facility];  Service: Orthopedics;  Laterality: Left;  . SHOULDER ARTHROSCOPY WITH ROTATOR CUFF REPAIR Left 05/05/2014   Procedure: SHOULDER ARTHROSCOPY LIMITED DEBRIDEMENT;  Surgeon: Carole Civil, MD;  [facility];  Service: Orthopedics;  Laterality: Left;  . SHOULDER OPEN ROTATOR CUFF REPAIR Left 05/05/2014   Procedure: ROTATOR CUFF REPAIR SHOULDER OPEN;  Surgeon: Carole Civil, MD;  Location[facility];  Service: Orthopedics;  Laterality: Left;  . SHOULDER SURGERY Right    Open Mumford procedure  . SPINAL FUSION     x 2    Social History Social History  Substance Use Topics  . Smoking status: Current  Every Day Smoker    Packs/day: 1.00    Years: 41.00    Types: Cigarettes  . Smokeless tobacco: Never Used     Comment: smokes a pack a day. since age 82  . Alcohol use No    Allergies  Allergen Reactions  . Celebrex [Celecoxib] Itching and Swelling    All over  . Codeine Nausea And Vomiting    Extreme stomach pain. This includes anything with the derivative of codeine in it.  . Doxycycline Swelling    Made tongue turn black   . Cortisone Swelling  . Oxycodone-Acetaminophen Itching    Can not  tolerate with benadryl   . Prednisone Swelling  . Relafen [Nabumetone] Swelling    Current Meds  Medication Sig  . ALPRAZolam (XANAX) 0.5 MG tablet Take 0.5 mg by mouth 2 (two) times daily.   Marland Kitchen atorvastatin (LIPITOR) 20 MG tablet Take 20 mg by mouth every morning.   . dicyclomine (BENTYL) 10 MG capsule Take 10 mg by mouth every other day.  Marland Kitchen HYDROcodone-acetaminophen (NORCO) 10-325 MG tablet Take 1 tablet by mouth every 6 (six) hours as needed.  Marland Kitchen ibuprofen (ADVIL,MOTRIN) 800 MG tablet Take 1 tablet (800 mg total) by mouth every 8 (eight) hours as needed.  . nitroGLYCERIN (NITROSTAT) 0.4 MG SL tablet Place 1 tablet (0.4 mg total) under the tongue every 5 (five) minutes as needed.  . pantoprazole (PROTONIX) 40 MG tablet Take 40 mg by mouth daily.   Marland Kitchen PROAIR HFA 108 (90 Base) MCG/ACT inhaler   . sertraline (ZOLOFT) 50 MG tablet Take 50 mg by mouth daily.   . temazepam (RESTORIL) 30 MG capsule Take 1 capsule by mouth at bedtime.  . [DISCONTINUED] QUEtiapine (SEROQUEL) 50 MG tablet Take 50 mg by mouth at bedtime.  . [DISCONTINUED] traMADol (ULTRAM) 50 MG tablet Take 1 tablet (50 mg total) by mouth every 6 (six) hours as needed.      Physical Exam Physical Exam 1.BP 118/69   Pulse 68   Ht 5\' 6"  (1.676 m)   Wt 216 lb (98 kg)   BMI 34.86 kg/m   2. Gen. appearance. The patient is well-developed and well-nourished, grooming and hygiene are normal. There are no gross congenital  abnormalities  3. The patient is alert and oriented to person place and time  4. Mood and affect are normal  5. Ambulation Heel-2 gait  Examination reveals the following: 6. On inspection we find tenderness over the medial joint line no effusion  7. With the range of motion of  full flexion 5 flexion contracture  8. Stability tests were normal    9. Strength tests revealed grade 5 motor strength  10. Skin we find no rash ulceration or erythema  11. Sensation remains intact  12 Vascular system shows no peripheral edema  Right knee no pain no swelling no tenderness  no instability  MEDICAL DECISION MAKING:    Data Reviewed LAST XRAYS medial compartment disease severe near bone-on-bone contact mild varus alignment to his left knee x-ray taken January 2018  Assessment Encounter Diagnosis  Name Primary?  Marland Kitchen Arthritis of left knee Yes    Plan He'll need a preop cardiac evaluation. Cardiologist is Dr. Peter Martinique Primary care physician Dr. Luan Pulling  Left total knee if he gets appropriate clearance  The procedure has been fully reviewed with the patient; The risks and benefits of surgery have been discussed and explained and understood. Alternative treatment has also been reviewed, questions were encouraged and answered. The postoperative plan is also been reviewed.   [name] 06/01/2017, 2:40 PM   Addendum we have contacted the patient's cardiologist and he has cleared the patient for surgery

## 2017-06-02 NOTE — Addendum Note (Signed)
Addended by: [name] E on: 06/02/2017 12:35 PM   Modules accepted: Orders, SmartSet

## 2017-06-04 ENCOUNTER — Telehealth: Payer: Self-pay | Admitting: Orthopedic Surgery

## 2017-06-04 NOTE — Telephone Encounter (Signed)
Patient and wife Christopher [name] (designated contact) called with questions regarding upcoming total knee surgery scheduled 06/16/17. States patient had previous difficulty with anesthesia "spinal or nerve block" and said Dr Aline Brochure may recall that when used in past surgery, it took an extremely long time for him to regain feeling back.  Also relays that he is unable to lie on his back for long periods of time due to history of back surgery.  Please advise.

## 2017-06-04 NOTE — Telephone Encounter (Signed)
Routing to DR. Harrison  

## 2017-06-05 NOTE — Telephone Encounter (Signed)
Discuss with anesthesia at preop visit or day of surgery

## 2017-06-05 NOTE — Telephone Encounter (Signed)
Patient's wife informed

## 2017-06-08 NOTE — Patient Instructions (Signed)
Christopher [name]  06/08/2017     @PREFPERIOPPHARMACY @   Your procedure is scheduled on 06/16/2017.  Report to [facility] at 9:45 A.M.  Call this number if you have problems the morning of surgery:  [phone]   Remember:  Do not eat food or drink liquids after midnight.  Take these medicines the morning of surgery with A SIP OF WATER Xanax, Bentyl, Hydrocodone if needed, Protonix, ProAir inhaler, Zoloft   Do not wear jewelry, make-up or nail polish.  Do not wear lotions, powders, or perfumes, or deoderant.  Do not shave 48 hours prior to surgery.  Men may shave face and neck.  Do not bring valuables to the hospital.  [facility]  is not responsible for any belongings or valuables.  Contacts, dentures or bridgework may not be worn into surgery.  Leave your suitcase in the car.  After surgery it may be brought to your room.  For patients admitted to the hospital, discharge time will be determined by your treatment team.  Patients discharged the day of surgery will not be allowed to drive home.    Please read over the following fact sheets that you were given. Surgical Site Infection Prevention and Anesthesia Post-op Instructions     PATIENT INSTRUCTIONS POST-ANESTHESIA  IMMEDIATELY FOLLOWING SURGERY:  Do not drive or operate machinery for the first twenty four hours after surgery.  Do not make any important decisions for twenty four hours after surgery or while taking narcotic pain medications or sedatives.  If you develop intractable nausea and vomiting or a severe headache please notify your doctor immediately.  FOLLOW-UP:  Please make an appointment with your surgeon as instructed. You do not need to follow up with anesthesia unless specifically instructed to do so.  WOUND CARE INSTRUCTIONS (if applicable):  Keep a dry clean dressing on the anesthesia/puncture wound site if there is drainage.  Once the wound has quit draining you may leave it open to air.  Generally you  should leave the bandage intact for twenty four hours unless there is drainage.  If the epidural site drains for more than 36-48 hours please call the anesthesia department.  QUESTIONS?:  Please feel free to call your physician or the hospital operator if you have any questions, and they will be happy to assist you.      Total Knee Replacement Total knee replacement is a procedure to replace the knee joint with an artificial (prosthetic) knee joint. The purpose of this surgery is to reduce knee pain and improve knee function. The prosthetic knee joint (prosthesis) is usually made of metal and plastic. It replaces parts of the thigh bone (femur), lower leg bone (tibia), and kneecap (patella) that are removed during the procedure. Tell a health care provider about:  Any allergies you have.  All medicines you are taking, including vitamins, herbs, eye drops, creams, and over-the-counter medicines.  Any problems you or family members have had with anesthetic medicines.  Any blood disorders you have.  Any surgeries you have had.  Any medical conditions you have.  Whether you are pregnant or may be pregnant. What are the risks? Generally, this is a safe procedure. However, problems may occur, including:  Infection.  Bleeding.  Allergic reactions to medicines.  Damage to other structures or organs.  Decreased range of motion of the knee.  Instability of the knee.  Loosening of the prosthetic joint.  Knee pain that does not go away (chronic pain).  What happens before the  procedure?  Ask your health care provider about: ? Changing or stopping your regular medicines. This is especially important if you are taking diabetes medicines or blood thinners. ? Taking medicines such as aspirin and ibuprofen. These medicines can thin your blood. Do not take these medicines before your procedure if your health care provider instructs you not to.  Have dental care and routine cleanings  completed before your procedure. Plan to not have dental work done for 3 months after your procedure. Germs from anywhere in your body, including your mouth, can travel to your new joint and infect it.  Follow instructions from your health care provider about eating or drinking restrictions.  Ask your health care provider how your surgical site will be marked or identified.  You may be given antibiotic medicine to help prevent infection.  If your health care provider prescribes physical therapy, do exercises as instructed.  Do not use any tobacco products, such as cigarettes, chewing tobacco, or e-cigarettes. If you need help quitting, ask your health care provider.  You may have a physical exam.  You may have tests, such as: ? X-rays. ? MRI. ? CT scan. ? Bone scans.  You may have a blood or urine sample taken.  Plan to have someone take you home after the procedure.  If you will be going home right after the procedure, plan to have someone with you for at least 24 hours. It is recommended that you have someone to help care for you for at least 4-6 weeks after your procedure. What happens during the procedure?  To reduce your risk of infection: ? Your health care team will wash or sanitize their hands. ? Your skin will be washed with soap.  An IV tube will be inserted into one of your veins.  You will be given one or more of the following: ? A medicine to help you relax (sedative). ? A medicine to numb the area (local anesthetic). ? A medicine to make you fall asleep (general anesthetic). ? A medicine that is injected into your spine to numb the area below and slightly above the injection site (spinal anesthetic). ? A medicine that is injected into an area of your body to numb everything below the injection site (regional anesthetic).  An incision will be made in your knee.  Damaged cartilage and bone will be removed from your femur, tibia, and patella.  Parts of the  prosthesis (liners)will be placed over the areas of bone and cartilage that were removed. A metal liner will be placed over your femur, and plastic liners will be placed over your tibia and the underside of your patella.  One or more small tubes (drains) may be placed near your incision to help drain extra fluid from your surgical site.  Your incision will be closed with stitches (sutures), skin glue, or adhesive strips. Medicine may be applied to your incision.  A bandage (dressing) will be placed over your incision. The procedure may vary among health care providers and hospitals. What happens after the procedure?  Your blood pressure, heart rate, breathing rate, and blood oxygen level will be monitored often until the medicines you were given have worn off.  You may continue to receive fluids and medicines through an IV tube.  You will have some pain. Pain medicines will be available to help you.  You may have fluid coming from one or more drains in your incision.  You may have to wear compression stockings. These stockings help  to prevent blood clots and reduce swelling in your legs.  You will be encouraged to move around as much as possible.  You may be given a continuous passive motion machine to use at home. You will be shown how to use this machine.  Do not drive for 24 hours if you received a sedative. This information is not intended to replace advice given to you by your health care provider. Make sure you discuss any questions you have with your health care provider. Document Released: 11/24/2000 Document Revised: 04/21/2016 Document Reviewed: 07/25/2015 Elsevier Interactive Patient Education  2017 [employer].

## 2017-06-09 ENCOUNTER — Encounter: Payer: Self-pay | Admitting: *Deleted

## 2017-06-09 NOTE — Progress Notes (Signed)
Surgery approved Authorization number [id number] received from [facility] for Left Total Knee CPT [id number] scheduled for 06/16/17. Patient's wife informed of pre-op appointment.

## 2017-06-10 ENCOUNTER — Other Ambulatory Visit: Payer: Self-pay | Admitting: *Deleted

## 2017-06-10 DIAGNOSIS — M1712 Unilateral primary osteoarthritis, left knee: Secondary | ICD-10-CM

## 2017-06-11 ENCOUNTER — Encounter (HOSPITAL_COMMUNITY)
Admission: RE | Admit: 2017-06-11 | Discharge: 2017-06-11 | Disposition: A | Discharge status: Home / Self Care | Payer: PPO | Source: Ambulatory Visit | Attending: Orthopedic Surgery | Admitting: Orthopedic Surgery

## 2017-06-11 ENCOUNTER — Encounter (HOSPITAL_COMMUNITY): Payer: Self-pay

## 2017-06-11 DIAGNOSIS — E785 Hyperlipidemia, unspecified: Secondary | ICD-10-CM | POA: Insufficient documentation

## 2017-06-11 DIAGNOSIS — Z01812 Encounter for preprocedural laboratory examination: Secondary | ICD-10-CM | POA: Diagnosis not present

## 2017-06-11 DIAGNOSIS — E119 Type 2 diabetes mellitus without complications: Secondary | ICD-10-CM | POA: Insufficient documentation

## 2017-06-11 DIAGNOSIS — R011 Cardiac murmur, unspecified: Secondary | ICD-10-CM | POA: Insufficient documentation

## 2017-06-11 DIAGNOSIS — M1712 Unilateral primary osteoarthritis, left knee: Secondary | ICD-10-CM | POA: Insufficient documentation

## 2017-06-11 DIAGNOSIS — G4730 Sleep apnea, unspecified: Secondary | ICD-10-CM | POA: Diagnosis not present

## 2017-06-11 HISTORY — DX: Unspecified asthma, uncomplicated: J45.909

## 2017-06-11 LAB — CBC WITH DIFFERENTIAL/PLATELET
Basophils Absolute: 0 10*3/uL (ref 0.0–0.1)
Basophils Relative: 0 %
Eosinophils Absolute: 0.3 10*3/uL (ref 0.0–0.7)
Eosinophils Relative: 4 %
HEMATOCRIT: 40.5 % (ref 39.0–52.0)
HEMOGLOBIN: 13.7 g/dL (ref 13.0–17.0)
LYMPHS PCT: 29 %
Lymphs Abs: 2.5 10*3/uL (ref 0.7–4.0)
MCH: 28.8 pg (ref 26.0–34.0)
MCHC: 33.8 g/dL (ref 30.0–36.0)
MCV: 85.3 fL (ref 78.0–100.0)
MONO ABS: 0.7 10*3/uL (ref 0.1–1.0)
MONOS PCT: 8 %
NEUTROS ABS: 5.1 10*3/uL (ref 1.7–7.7)
NEUTROS PCT: 59 %
Platelets: 210 10*3/uL (ref 150–400)
RBC: 4.75 MIL/uL (ref 4.22–5.81)
RDW: 14 % (ref 11.5–15.5)
WBC: 8.6 10*3/uL (ref 4.0–10.5)

## 2017-06-11 LAB — BASIC METABOLIC PANEL
ANION GAP: 11 (ref 5–15)
BUN: 19 mg/dL (ref 6–20)
CALCIUM: 9.2 mg/dL (ref 8.9–10.3)
CHLORIDE: 101 mmol/L (ref 101–111)
CO2: 23 mmol/L (ref 22–32)
CREATININE: 0.96 mg/dL (ref 0.61–1.24)
GFR calc Af Amer: >60 mL/min (ref >60)
GFR calc non Af Amer: >60 mL/min (ref >60)
GLUCOSE: 128 mg/dL — AB (ref 65–99)
Potassium: 3.9 mmol/L (ref 3.5–5.1)
Sodium: 135 mmol/L (ref 135–145)

## 2017-06-11 LAB — ABO/RH: ABO/RH(D): B POS

## 2017-06-11 LAB — APTT: APTT: 27 s (ref 24–36)

## 2017-06-11 LAB — PROTIME-INR
INR: 0.99
PROTHROMBIN TIME: 13 s (ref 11.4–15.2)

## 2017-06-11 LAB — SURGICAL PCR SCREEN
MRSA, PCR: NEGATIVE
STAPHYLOCOCCUS AUREUS: NEGATIVE

## 2017-06-11 LAB — HEMOGLOBIN A1C
Hgb A1c MFr Bld: 5.6 % (ref 4.8–5.6)
Mean Plasma Glucose: 114.02 mg/dL

## 2017-06-11 LAB — PREPARE RBC (CROSSMATCH)

## 2017-06-15 NOTE — H&P (Signed)
Patient ID: Christopher [name], male   DOB: 20-Sep-1955, 61 y.o.   MRN: [id number]   Chief Complaint  Patient presents with  . Follow-up      Left knee      HPI Christopher [name] is a 61 y.o. male.  Presents for reevaluation of ongoing pain in his left knee   He's had multiple cortisone injections, he's had oral medication including hydrocodone for pain is used icy hot. His knee pain is getting worse. He complains of a constant dull aching pain on the medial joint line of his left knee with occasional pain in the back of the knee. It should be noted that he has had lumbar disc surgery.   He says he is ready to have something done to his knee. He wants to have knee replacement surgery.   Review of Systems Review of Systems  Respiratory: Negative for wheezing.   Cardiovascular:       Uses nitroglycerin for occasional chest pain  Musculoskeletal: Positive for back pain and gait problem.    (2 MINIMUM)       Past Medical History:  Diagnosis Date  . Anxiety    . Arthritis    . Complication of anesthesia      pt had a hard time being able to move after spinal anesthesia , 3-4 hours  . Depression    . Diabetes mellitus without complication ([place])      pt sts borderline  . GERD (gastroesophageal reflux disease)    . Headache(784.0)      after surgery  . Heart murmur      Years ago- not now  . Hyperlipidemia    . PONV (postoperative nausea and vomiting)    . Sleep apnea      uses CIPAP machine at night           Past Surgical History:  Procedure Laterality Date  . APPENDECTOMY      . BACK SURGERY        neck and back fusion  . BIOPSY   12/27/2015    Procedure: BIOPSY;  Surgeon: Rogene Houston, MD;  Location: [facility];  Service: Endoscopy;;  Fundus biopsies and duodenal biopsies  . CARDIAC CATHETERIZATION      . CARDIAC CATHETERIZATION N/A 09/04/2016    Procedure: Right/Left Heart Cath and Coronary Angiography;  Surgeon: Peter M Martinique, MD;  Location: [place];   Service: Cardiovascular;  Laterality: N/A;  . CHONDROPLASTY   08/15/2011    Procedure: CHONDROPLASTY;  Surgeon: [name], MD;  Location: [facility];  Service: Orthopedics;  Laterality: Left;  . COLONOSCOPY   06/27/2011    Procedure: COLONOSCOPY;  Surgeon: Rogene Houston, MD;  Location: [facility];  Service: Endoscopy;  Laterality: N/A;  9:00 / Pt to be here at 9am for 10:45 procedure, benign polyps removed  . COLONOSCOPY N/A 10/05/2014    Procedure: COLONOSCOPY;  Surgeon: Rogene Houston, MD;  Location: [facility];  Service: Endoscopy;  Laterality: N/A;  930  . ESOPHAGOGASTRODUODENOSCOPY N/A 12/27/2015    Procedure: ESOPHAGOGASTRODUODENOSCOPY (EGD);  Surgeon: Rogene Houston, MD;  [facility];  Service: Endoscopy;  Laterality: N/A;  3:00  . HERNIA REPAIR        umbilical hernia  . KNEE ARTHROSCOPY        left knee  . KNEE ARTHROSCOPY        right knee   . LUMBAR LAMINECTOMY/DECOMPRESSION MICRODISCECTOMY   09/14/2012  Procedure: LUMBAR LAMINECTOMY/DECOMPRESSION MICRODISCECTOMY 1 LEVEL;  Surgeon: Floyce Stakes, MD;  Location: [place] [facility];  Service: Neurosurgery;  Laterality: Right;  Right Lumbar three-four Diskectomy  . neck fusion      . SHOULDER ARTHROSCOPY WITH BICEPSTENOTOMY Left 09/23/2013    Procedure: SHOULDER ARTHROSCOPY WITH BICEPSTENOTOMY AND EXTENSIVE DEBRIDEMENT;  Surgeon: Carole Civil, MD;  Location: [facility];  Service: Orthopedics;  Laterality: Left;  . SHOULDER ARTHROSCOPY WITH ROTATOR CUFF REPAIR Left 05/05/2014    Procedure: SHOULDER ARTHROSCOPY LIMITED DEBRIDEMENT;  Surgeon: Carole Civil, MD;  Location: [facility];  Service: Orthopedics;  Laterality: Left;  . SHOULDER OPEN ROTATOR CUFF REPAIR Left 05/05/2014    Procedure: ROTATOR CUFF REPAIR SHOULDER OPEN;  Surgeon: Carole Civil, MD;  Location: [facility];  Service: Orthopedics;  Laterality: Left;  . SHOULDER SURGERY Right      Open Mumford procedure  . SPINAL FUSION        x 2      Social  History       Social History  Substance Use Topics  . Smoking status: Current Every Day Smoker      Packs/day: 1.00      Years: 41.00      Types: Cigarettes  . Smokeless tobacco: Never Used        Comment: smokes a pack a day. since age 89  . Alcohol use No           Allergies  Allergen Reactions  . Celebrex [Celecoxib] Itching and Swelling      All over  . Codeine Nausea And Vomiting      Extreme stomach pain. This includes anything with the derivative of codeine in it.  . Doxycycline Swelling      Made tongue turn black   . Cortisone Swelling  . Oxycodone-Acetaminophen Itching      Can not  tolerate with benadryl   . Prednisone Swelling  . Relafen [Nabumetone] Swelling          Current Meds  Medication Sig  . ALPRAZolam (XANAX) 0.5 MG tablet Take 0.5 mg by mouth 2 (two) times daily.   Marland Kitchen atorvastatin (LIPITOR) 20 MG tablet Take 20 mg by mouth every morning.   . dicyclomine (BENTYL) 10 MG capsule Take 10 mg by mouth every other day.  Marland Kitchen HYDROcodone-acetaminophen (NORCO) 10-325 MG tablet Take 1 tablet by mouth every 6 (six) hours as needed.  Marland Kitchen ibuprofen (ADVIL,MOTRIN) 800 MG tablet Take 1 tablet (800 mg total) by mouth every 8 (eight) hours as needed.  . nitroGLYCERIN (NITROSTAT) 0.4 MG SL tablet Place 1 tablet (0.4 mg total) under the tongue every 5 (five) minutes as needed.  . pantoprazole (PROTONIX) 40 MG tablet Take 40 mg by mouth daily.   Marland Kitchen PROAIR HFA 108 (90 Base) MCG/ACT inhaler    . sertraline (ZOLOFT) 50 MG tablet Take 50 mg by mouth daily.   . temazepam (RESTORIL) 30 MG capsule Take 1 capsule by mouth at bedtime.  . [DISCONTINUED] QUEtiapine (SEROQUEL) 50 MG tablet Take 50 mg by mouth at bedtime.  . [DISCONTINUED] traMADol (ULTRAM) 50 MG tablet Take 1 tablet (50 mg total) by mouth every 6 (six) hours as needed.          Physical Exam Physical Exam 1.BP 118/69   Pulse 68   Ht 5\' 6"  (1.676 m)   Wt 216 lb (98 kg)   BMI 34.86 kg/m    2. Gen. appearance.  The patient is well-developed and well-nourished,  grooming and hygiene are normal. There are no gross congenital abnormalities   3. The patient is alert and oriented to person place and time   4. Mood and affect are normal   5. Ambulation Heel-2 gait   Examination reveals the following: 6. On inspection we find tenderness over the medial joint line no effusion   7. With the range of motion of  full flexion 5 flexion contracture   8. Stability tests were normal     9. Strength tests revealed grade 5 motor strength   10. Skin we find no rash ulceration or erythema   11. Sensation remains intact   12 Vascular system shows no peripheral edema   Right knee no pain no swelling no tenderness no instability   MEDICAL DECISION MAKING:    Data Reviewed LAST XRAYS medial compartment disease severe near bone-on-bone contact mild varus alignment to his left knee x-ray taken January 2018   Assessment     Encounter Diagnosis  Name Primary?  Marland Kitchen Arthritis of left knee Yes      Plan He'll need a preop cardiac evaluation. Cardiologist is Dr. Peter Martinique Primary care physician Dr. Luan Pulling   Left total knee if he gets appropriate clearance   The procedure has been fully reviewed with the patient; The risks and benefits of surgery have been discussed and explained and understood. Alternative treatment has also been reviewed, questions were encouraged and answered. The postoperative plan is also been reviewed.     [name] 06/01/2017, 2:40 PM    Addendum we have contacted the patient's cardiologist and he has cleared the patient for surgery

## 2017-06-16 ENCOUNTER — Inpatient Hospital Stay (HOSPITAL_COMMUNITY)
Admission: RE | Admit: 2017-06-16 | Discharge: 2017-06-19 | DRG: 470 | Disposition: A | Discharge status: Home Health | Payer: PPO | Source: Ambulatory Visit | Attending: Orthopedic Surgery | Admitting: Orthopedic Surgery

## 2017-06-16 ENCOUNTER — Inpatient Hospital Stay (HOSPITAL_COMMUNITY): Payer: PPO | Admitting: Anesthesiology

## 2017-06-16 ENCOUNTER — Encounter (HOSPITAL_COMMUNITY): Payer: Self-pay | Admitting: *Deleted

## 2017-06-16 ENCOUNTER — Encounter (HOSPITAL_COMMUNITY)
Admission: RE | Disposition: A | Discharge status: Home Health | Payer: Self-pay | Source: Ambulatory Visit | Attending: Orthopedic Surgery

## 2017-06-16 ENCOUNTER — Inpatient Hospital Stay (HOSPITAL_COMMUNITY): Payer: PPO

## 2017-06-16 DIAGNOSIS — J449 Chronic obstructive pulmonary disease, unspecified: Secondary | ICD-10-CM | POA: Diagnosis present

## 2017-06-16 DIAGNOSIS — K219 Gastro-esophageal reflux disease without esophagitis: Secondary | ICD-10-CM | POA: Diagnosis present

## 2017-06-16 DIAGNOSIS — Z471 Aftercare following joint replacement surgery: Secondary | ICD-10-CM | POA: Diagnosis not present

## 2017-06-16 DIAGNOSIS — M1712 Unilateral primary osteoarthritis, left knee: Principal | ICD-10-CM | POA: Diagnosis present

## 2017-06-16 DIAGNOSIS — G4730 Sleep apnea, unspecified: Secondary | ICD-10-CM | POA: Diagnosis present

## 2017-06-16 DIAGNOSIS — E119 Type 2 diabetes mellitus without complications: Secondary | ICD-10-CM | POA: Diagnosis not present

## 2017-06-16 DIAGNOSIS — Z96652 Presence of left artificial knee joint: Secondary | ICD-10-CM

## 2017-06-16 DIAGNOSIS — F17210 Nicotine dependence, cigarettes, uncomplicated: Secondary | ICD-10-CM | POA: Diagnosis not present

## 2017-06-16 DIAGNOSIS — M25562 Pain in left knee: Secondary | ICD-10-CM | POA: Diagnosis present

## 2017-06-16 DIAGNOSIS — Z96659 Presence of unspecified artificial knee joint: Secondary | ICD-10-CM

## 2017-06-16 DIAGNOSIS — E785 Hyperlipidemia, unspecified: Secondary | ICD-10-CM | POA: Diagnosis not present

## 2017-06-16 HISTORY — PX: TOTAL KNEE ARTHROPLASTY: SHX125

## 2017-06-16 LAB — GLUCOSE, CAPILLARY
GLUCOSE-CAPILLARY: 116 mg/dL — AB (ref 65–99)
GLUCOSE-CAPILLARY: 116 mg/dL — AB (ref 65–99)
Glucose-Capillary: 119 mg/dL — ABNORMAL HIGH (ref 65–99)

## 2017-06-16 SURGERY — ARTHROPLASTY, KNEE, TOTAL
Anesthesia: General | Laterality: Left

## 2017-06-16 MED ORDER — SERTRALINE HCL 50 MG PO TABS
50.0000 mg | ORAL_TABLET | Freq: Every day | ORAL | Status: DC
  Administered 2017-06-17 – 2017-06-19 (×3): 50 mg via ORAL
  Filled 2017-06-16 (×3): qty 1

## 2017-06-16 MED ORDER — TEMAZEPAM 15 MG PO CAPS
30.0000 mg | ORAL_CAPSULE | Freq: Every day | ORAL | Status: DC
  Administered 2017-06-16 – 2017-06-18 (×3): 30 mg via ORAL
  Filled 2017-06-16 (×3): qty 2

## 2017-06-16 MED ORDER — PHENOL 1.4 % MT LIQD: 1.0000 | OROMUCOSAL | Status: DC | PRN

## 2017-06-16 MED ORDER — PROPOFOL 10 MG/ML IV BOLUS
INTRAVENOUS | Status: DC | PRN
Start: 2017-06-16 — End: 2017-06-16
  Administered 2017-06-16: 180 mg via INTRAVENOUS
  Administered 2017-06-16: 60 mg via INTRAVENOUS

## 2017-06-16 MED ORDER — LIDOCAINE HCL (CARDIAC) 20 MG/ML IV SOLN
INTRAVENOUS | Status: DC | PRN
  Administered 2017-06-16: 30 mg via INTRAVENOUS

## 2017-06-16 MED ORDER — FENTANYL CITRATE (PF) 100 MCG/2ML IJ SOLN
INTRAMUSCULAR | Status: AC
  Filled 2017-06-16: qty 2

## 2017-06-16 MED ORDER — METOCLOPRAMIDE HCL 5 MG/ML IJ SOLN
5.0000 mg | Freq: Three times a day (TID) | INTRAMUSCULAR | Status: DC | PRN
  Administered 2017-06-17: 10 mg via INTRAVENOUS
  Filled 2017-06-16: qty 2

## 2017-06-16 MED ORDER — 0.9 % SODIUM CHLORIDE (POUR BTL) OPTIME
TOPICAL | Status: DC | PRN
  Administered 2017-06-16: 1000 mL

## 2017-06-16 MED ORDER — SUCCINYLCHOLINE CHLORIDE 20 MG/ML IJ SOLN
INTRAMUSCULAR | Status: AC
  Filled 2017-06-16: qty 1

## 2017-06-16 MED ORDER — HYDROMORPHONE HCL 1 MG/ML IJ SOLN
0.5000 mg | INTRAMUSCULAR | Status: DC | PRN
  Administered 2017-06-16 (×2): 0.5 mg via INTRAVENOUS

## 2017-06-16 MED ORDER — FLEET ENEMA 7-19 GM/118ML RE ENEM: 1.0000 | ENEMA | Freq: Once | RECTAL | Status: DC | PRN

## 2017-06-16 MED ORDER — DOCUSATE SODIUM 100 MG PO CAPS
100.0000 mg | ORAL_CAPSULE | Freq: Two times a day (BID) | ORAL | Status: DC
  Administered 2017-06-16 – 2017-06-19 (×6): 100 mg via ORAL
  Filled 2017-06-16 (×6): qty 1

## 2017-06-16 MED ORDER — GABAPENTIN 300 MG PO CAPS
300.0000 mg | ORAL_CAPSULE | Freq: Three times a day (TID) | ORAL | Status: DC
  Administered 2017-06-16 – 2017-06-19 (×8): 300 mg via ORAL
  Filled 2017-06-16 (×8): qty 1

## 2017-06-16 MED ORDER — LACTATED RINGERS IV SOLN
INTRAVENOUS | Status: DC
  Administered 2017-06-16: 13:00:00 via INTRAVENOUS
  Administered 2017-06-16: 1000 mL via INTRAVENOUS
  Administered 2017-06-16: 14:00:00 via INTRAVENOUS

## 2017-06-16 MED ORDER — HYDROMORPHONE HCL 1 MG/ML IJ SOLN
INTRAMUSCULAR | Status: AC
  Filled 2017-06-16: qty 1

## 2017-06-16 MED ORDER — PHENYLEPHRINE 40 MCG/ML (10ML) SYRINGE FOR IV PUSH (FOR BLOOD PRESSURE SUPPORT)
PREFILLED_SYRINGE | INTRAVENOUS | Status: AC
  Filled 2017-06-16: qty 10

## 2017-06-16 MED ORDER — BUPIVACAINE-EPINEPHRINE (PF) 0.5% -1:200000 IJ SOLN
INTRAMUSCULAR | Status: DC | PRN
  Administered 2017-06-16: 30 mL

## 2017-06-16 MED ORDER — METHOCARBAMOL 1000 MG/10ML IJ SOLN
500.0000 mg | Freq: Four times a day (QID) | INTRAVENOUS | Status: DC | PRN
  Filled 2017-06-16: qty 5

## 2017-06-16 MED ORDER — FENTANYL CITRATE (PF) 250 MCG/5ML IJ SOLN
INTRAMUSCULAR | Status: AC
  Filled 2017-06-16: qty 5

## 2017-06-16 MED ORDER — MIDAZOLAM HCL 2 MG/2ML IJ SOLN
INTRAMUSCULAR | Status: AC
  Filled 2017-06-16: qty 2

## 2017-06-16 MED ORDER — SENNOSIDES-DOCUSATE SODIUM 8.6-50 MG PO TABS: 1.0000 | ORAL_TABLET | Freq: Every evening | ORAL | Status: DC | PRN

## 2017-06-16 MED ORDER — BUPIVACAINE LIPOSOME 1.3 % IJ SUSP
INTRAMUSCULAR | Status: AC
  Filled 2017-06-16: qty 20

## 2017-06-16 MED ORDER — ACETAMINOPHEN 500 MG PO TABS
500.0000 mg | ORAL_TABLET | Freq: Once | ORAL | Status: AC
  Administered 2017-06-16: 500 mg via ORAL

## 2017-06-16 MED ORDER — BUPIVACAINE IN DEXTROSE 0.75-8.25 % IT SOLN
INTRATHECAL | Status: AC
  Filled 2017-06-16: qty 2

## 2017-06-16 MED ORDER — HYDROMORPHONE HCL 1 MG/ML IJ SOLN
1.0000 mg | INTRAMUSCULAR | Status: DC | PRN
  Administered 2017-06-16 – 2017-06-19 (×22): 1 mg via INTRAVENOUS
  Filled 2017-06-16 (×21): qty 1

## 2017-06-16 MED ORDER — HYDROCODONE-ACETAMINOPHEN 10-325 MG PO TABS
ORAL_TABLET | ORAL | Status: AC
  Filled 2017-06-16: qty 1

## 2017-06-16 MED ORDER — FENTANYL CITRATE (PF) 100 MCG/2ML IJ SOLN
INTRAMUSCULAR | Status: DC | PRN
Start: 2017-06-16 — End: 2017-06-16
  Administered 2017-06-16: 50 ug via INTRAVENOUS
  Administered 2017-06-16: 25 ug via INTRAVENOUS
  Administered 2017-06-16 (×2): 50 ug via INTRAVENOUS
  Administered 2017-06-16 (×2): 25 ug via INTRAVENOUS
  Administered 2017-06-16 (×2): 50 ug via INTRAVENOUS
  Administered 2017-06-16: 25 ug via INTRAVENOUS

## 2017-06-16 MED ORDER — PREGABALIN 50 MG PO CAPS
50.0000 mg | ORAL_CAPSULE | Freq: Three times a day (TID) | ORAL | Status: DC
  Administered 2017-06-16: 50 mg via ORAL

## 2017-06-16 MED ORDER — SODIUM CHLORIDE 0.9 % IV SOLN
INTRAVENOUS | Status: DC
  Administered 2017-06-16: 18:00:00 via INTRAVENOUS

## 2017-06-16 MED ORDER — BUPIVACAINE-EPINEPHRINE (PF) 0.5% -1:200000 IJ SOLN
INTRAMUSCULAR | Status: AC
  Filled 2017-06-16: qty 60

## 2017-06-16 MED ORDER — EPHEDRINE SULFATE 50 MG/ML IJ SOLN
INTRAMUSCULAR | Status: AC
  Filled 2017-06-16: qty 1

## 2017-06-16 MED ORDER — VANCOMYCIN HCL IN DEXTROSE 1-5 GM/200ML-% IV SOLN
INTRAVENOUS | Status: AC
  Filled 2017-06-16: qty 200

## 2017-06-16 MED ORDER — APIXABAN 2.5 MG PO TABS
2.5000 mg | ORAL_TABLET | Freq: Two times a day (BID) | ORAL | Status: DC
  Administered 2017-06-17 – 2017-06-19 (×5): 2.5 mg via ORAL
  Filled 2017-06-16 (×5): qty 1

## 2017-06-16 MED ORDER — ONDANSETRON HCL 4 MG/2ML IJ SOLN
INTRAMUSCULAR | Status: AC
  Filled 2017-06-16: qty 2

## 2017-06-16 MED ORDER — VANCOMYCIN HCL 10 G IV SOLR
INTRAVENOUS | Status: AC
  Filled 2017-06-16: qty 1500

## 2017-06-16 MED ORDER — METOCLOPRAMIDE HCL 10 MG PO TABS: 5.0000 mg | ORAL_TABLET | Freq: Three times a day (TID) | ORAL | Status: DC | PRN

## 2017-06-16 MED ORDER — DIPHENHYDRAMINE HCL 12.5 MG/5ML PO ELIX
12.5000 mg | ORAL_SOLUTION | ORAL | Status: DC | PRN
  Administered 2017-06-16 – 2017-06-17 (×2): 25 mg via ORAL
  Administered 2017-06-17: 12.5 mg via ORAL
  Filled 2017-06-16 (×2): qty 10
  Filled 2017-06-16: qty 5

## 2017-06-16 MED ORDER — ONDANSETRON HCL 4 MG/2ML IJ SOLN
4.0000 mg | Freq: Once | INTRAMUSCULAR | Status: AC
  Administered 2017-06-16: 4 mg via INTRAVENOUS

## 2017-06-16 MED ORDER — SODIUM CHLORIDE 0.9 % IV SOLN
INTRAVENOUS | Status: DC | PRN
  Administered 2017-06-16: 60 mL

## 2017-06-16 MED ORDER — ROCURONIUM BROMIDE 100 MG/10ML IV SOLN
INTRAVENOUS | Status: DC | PRN
  Administered 2017-06-16: 30 mg via INTRAVENOUS
  Administered 2017-06-16 (×2): 10 mg via INTRAVENOUS

## 2017-06-16 MED ORDER — TRANEXAMIC ACID 1000 MG/10ML IV SOLN
1000.0000 mg | INTRAVENOUS | Status: AC
  Administered 2017-06-16: 1000 mg via INTRAVENOUS
  Filled 2017-06-16: qty 10

## 2017-06-16 MED ORDER — LIDOCAINE HCL (PF) 1 % IJ SOLN
INTRAMUSCULAR | Status: AC
  Filled 2017-06-16: qty 5

## 2017-06-16 MED ORDER — PROPOFOL 10 MG/ML IV BOLUS
INTRAVENOUS | Status: AC
  Filled 2017-06-16: qty 20

## 2017-06-16 MED ORDER — SUGAMMADEX SODIUM 500 MG/5ML IV SOLN
INTRAVENOUS | Status: DC | PRN
  Administered 2017-06-16: 200.4 mg via INTRAVENOUS

## 2017-06-16 MED ORDER — ALBUTEROL SULFATE HFA 108 (90 BASE) MCG/ACT IN AERS: 2.0000 | INHALATION_SPRAY | Freq: Four times a day (QID) | RESPIRATORY_TRACT | Status: DC | PRN

## 2017-06-16 MED ORDER — ACETAMINOPHEN 650 MG RE SUPP: 650.0000 mg | Freq: Four times a day (QID) | RECTAL | Status: DC | PRN

## 2017-06-16 MED ORDER — ALBUTEROL SULFATE (2.5 MG/3ML) 0.083% IN NEBU: 2.5000 mg | INHALATION_SOLUTION | Freq: Four times a day (QID) | RESPIRATORY_TRACT | Status: DC | PRN

## 2017-06-16 MED ORDER — ONDANSETRON HCL 4 MG/2ML IJ SOLN
4.0000 mg | Freq: Four times a day (QID) | INTRAMUSCULAR | Status: DC | PRN
  Administered 2017-06-17 – 2017-06-18 (×2): 4 mg via INTRAVENOUS
  Filled 2017-06-16 (×2): qty 2

## 2017-06-16 MED ORDER — IBUPROFEN 800 MG PO TABS
800.0000 mg | ORAL_TABLET | Freq: Three times a day (TID) | ORAL | Status: DC | PRN
  Administered 2017-06-16 – 2017-06-18 (×2): 800 mg via ORAL
  Filled 2017-06-16 (×2): qty 1

## 2017-06-16 MED ORDER — SODIUM CHLORIDE 0.9 % IV SOLN
INTRAVENOUS | Status: DC | PRN
  Administered 2017-06-16: 12:00:00 via INTRAVENOUS

## 2017-06-16 MED ORDER — ALUM & MAG HYDROXIDE-SIMETH 200-200-20 MG/5ML PO SUSP: 30.0000 mL | ORAL | Status: DC | PRN

## 2017-06-16 MED ORDER — CEFAZOLIN SODIUM-DEXTROSE 2-4 GM/100ML-% IV SOLN: 2.0000 g | INTRAVENOUS | Status: DC

## 2017-06-16 MED ORDER — MIDAZOLAM HCL 2 MG/2ML IJ SOLN
1.0000 mg | INTRAMUSCULAR | Status: DC
  Administered 2017-06-16: 2 mg via INTRAVENOUS

## 2017-06-16 MED ORDER — NITROGLYCERIN 0.4 MG SL SUBL: 0.4000 mg | SUBLINGUAL_TABLET | SUBLINGUAL | Status: DC | PRN

## 2017-06-16 MED ORDER — MENTHOL 3 MG MT LOZG: 1.0000 | LOZENGE | OROMUCOSAL | Status: DC | PRN

## 2017-06-16 MED ORDER — GLYCOPYRROLATE 0.2 MG/ML IJ SOLN
INTRAMUSCULAR | Status: DC | PRN
  Administered 2017-06-16: 0.2 mg via INTRAVENOUS

## 2017-06-16 MED ORDER — SUGAMMADEX SODIUM 500 MG/5ML IV SOLN
INTRAVENOUS | Status: AC
  Filled 2017-06-16: qty 5

## 2017-06-16 MED ORDER — VANCOMYCIN HCL IN DEXTROSE 1-5 GM/200ML-% IV SOLN
1000.0000 mg | Freq: Two times a day (BID) | INTRAVENOUS | Status: AC
  Administered 2017-06-16: 1000 mg via INTRAVENOUS
  Filled 2017-06-16: qty 200

## 2017-06-16 MED ORDER — SODIUM CHLORIDE 0.9 % IJ SOLN
INTRAMUSCULAR | Status: AC
  Filled 2017-06-16: qty 40

## 2017-06-16 MED ORDER — PREGABALIN 50 MG PO CAPS
ORAL_CAPSULE | ORAL | Status: AC
  Filled 2017-06-16: qty 1

## 2017-06-16 MED ORDER — BISACODYL 5 MG PO TBEC: 5.0000 mg | DELAYED_RELEASE_TABLET | Freq: Every day | ORAL | Status: DC | PRN

## 2017-06-16 MED ORDER — SODIUM CHLORIDE 0.9 % IR SOLN
Status: DC | PRN
  Administered 2017-06-16: 3000 mL

## 2017-06-16 MED ORDER — ATORVASTATIN CALCIUM 20 MG PO TABS
20.0000 mg | ORAL_TABLET | ORAL | Status: DC
  Administered 2017-06-17 – 2017-06-19 (×3): 20 mg via ORAL
  Filled 2017-06-16 (×3): qty 1

## 2017-06-16 MED ORDER — ONDANSETRON HCL 4 MG PO TABS: 4.0000 mg | ORAL_TABLET | Freq: Four times a day (QID) | ORAL | Status: DC | PRN

## 2017-06-16 MED ORDER — PANTOPRAZOLE SODIUM 40 MG PO TBEC
40.0000 mg | DELAYED_RELEASE_TABLET | Freq: Every day | ORAL | Status: DC
Start: 2017-06-17 — End: 2017-06-19
  Administered 2017-06-17 – 2017-06-19 (×3): 40 mg via ORAL
  Filled 2017-06-16 (×3): qty 1

## 2017-06-16 MED ORDER — SUCCINYLCHOLINE CHLORIDE 20 MG/ML IJ SOLN
INTRAMUSCULAR | Status: DC | PRN
  Administered 2017-06-16: 160 mg via INTRAVENOUS

## 2017-06-16 MED ORDER — VANCOMYCIN HCL 10 G IV SOLR
1500.0000 mg | Freq: Once | INTRAVENOUS | Status: AC
  Administered 2017-06-16: 1500 mg via INTRAVENOUS
  Filled 2017-06-16: qty 1500

## 2017-06-16 MED ORDER — ACETAMINOPHEN 500 MG PO TABS
ORAL_TABLET | ORAL | Status: AC
  Filled 2017-06-16: qty 1

## 2017-06-16 MED ORDER — ACETAMINOPHEN 325 MG PO TABS
650.0000 mg | ORAL_TABLET | Freq: Four times a day (QID) | ORAL | Status: DC | PRN
  Administered 2017-06-17: 650 mg via ORAL
  Filled 2017-06-16: qty 2

## 2017-06-16 MED ORDER — METHOCARBAMOL 500 MG PO TABS
500.0000 mg | ORAL_TABLET | Freq: Four times a day (QID) | ORAL | Status: DC | PRN
  Administered 2017-06-16 – 2017-06-19 (×5): 500 mg via ORAL
  Filled 2017-06-16 (×5): qty 1

## 2017-06-16 MED ORDER — HYDROCODONE-ACETAMINOPHEN 10-325 MG PO TABS
1.0000 | ORAL_TABLET | ORAL | Status: DC | PRN
  Administered 2017-06-16: 1 via ORAL

## 2017-06-16 MED ORDER — DICYCLOMINE HCL 10 MG PO CAPS
10.0000 mg | ORAL_CAPSULE | ORAL | Status: DC
  Administered 2017-06-18: 10 mg via ORAL
  Filled 2017-06-16: qty 1

## 2017-06-16 MED ORDER — ROCURONIUM BROMIDE 50 MG/5ML IV SOLN
INTRAVENOUS | Status: AC
  Filled 2017-06-16: qty 1

## 2017-06-16 MED ORDER — FENTANYL CITRATE (PF) 100 MCG/2ML IJ SOLN: 25.0000 ug | INTRAMUSCULAR | Status: DC | PRN

## 2017-06-16 MED ORDER — SODIUM CHLORIDE 0.9 % IJ SOLN
INTRAMUSCULAR | Status: AC
  Filled 2017-06-16: qty 10

## 2017-06-16 MED ORDER — ONDANSETRON HCL 4 MG/2ML IJ SOLN
4.0000 mg | Freq: Four times a day (QID) | INTRAMUSCULAR | Status: DC
  Administered 2017-06-16: 4 mg via INTRAVENOUS

## 2017-06-16 MED ORDER — CHLORHEXIDINE GLUCONATE 4 % EX LIQD: 60.0000 mL | Freq: Once | CUTANEOUS | Status: DC

## 2017-06-16 MED ORDER — METHOCARBAMOL 1000 MG/10ML IJ SOLN
500.0000 mg | Freq: Four times a day (QID) | INTRAVENOUS | Status: DC
  Administered 2017-06-16: 500 mg via INTRAVENOUS
  Filled 2017-06-16 (×5): qty 5

## 2017-06-16 MED ORDER — HYDROMORPHONE HCL 1 MG/ML IJ SOLN
0.5000 mg | INTRAMUSCULAR | Status: AC | PRN
  Administered 2017-06-16 (×2): 0.5 mg via INTRAVENOUS

## 2017-06-16 SURGICAL SUPPLY — 64 items
BAG HAMPER (MISCELLANEOUS) ×3 IMPLANT
BANDAGE ELASTIC 4 LF NS (GAUZE/BANDAGES/DRESSINGS) ×6 IMPLANT
BANDAGE ELASTIC 6 LF NS (GAUZE/BANDAGES/DRESSINGS) ×3 IMPLANT
BANDAGE ESMARK 6X9 LF (GAUZE/BANDAGES/DRESSINGS) ×1 IMPLANT
BLADE HEX COATED 2.75 (ELECTRODE) ×3 IMPLANT
BLADE SAGITTAL 25.0X1.27X90 (BLADE) ×2 IMPLANT
BLADE SAGITTAL 25.0X1.27X90MM (BLADE) ×1
BNDG ESMARK 6X9 LF (GAUZE/BANDAGES/DRESSINGS) ×3
CAP KNEE TOTAL 3 SIGMA ×3 IMPLANT
CEMENT HV SMART SET (Cement) ×6 IMPLANT
CLOTH BEACON ORANGE TIMEOUT ST (SAFETY) ×3 IMPLANT
COOLER CRYO CUFF IC AND MOTOR (MISCELLANEOUS) ×3 IMPLANT
COVER LIGHT HANDLE STERIS (MISCELLANEOUS) ×6 IMPLANT
CUFF CRYO KNEE18X23 MED (MISCELLANEOUS) ×3 IMPLANT
CUFF TOURNIQUET SINGLE 34IN LL (TOURNIQUET CUFF) ×3 IMPLANT
DECANTER SPIKE VIAL GLASS SM (MISCELLANEOUS) ×3 IMPLANT
DRAPE BACK TABLE (DRAPES) ×3 IMPLANT
DRAPE EXTREMITY T 121X128X90 (DRAPE) ×3 IMPLANT
DRESSING AQUACEL AG ADV 3.5X12 (MISCELLANEOUS) ×1 IMPLANT
DRSG AQUACEL AG ADV 3.5X12 (MISCELLANEOUS) ×3
DRSG MEPILEX BORDER 4X12 (GAUZE/BANDAGES/DRESSINGS) ×3 IMPLANT
DURAPREP 26ML APPLICATOR (WOUND CARE) ×6 IMPLANT
ELECT REM PT RETURN 9FT ADLT (ELECTROSURGICAL) ×3
ELECTRODE REM PT RTRN 9FT ADLT (ELECTROSURGICAL) ×1 IMPLANT
EVACUATOR 3/16  PVC DRAIN (DRAIN) ×2
EVACUATOR 3/16 PVC DRAIN (DRAIN) ×1 IMPLANT
GLOVE BIO SURGEON STRL SZ7 (GLOVE) ×15 IMPLANT
GLOVE BIOGEL PI IND STRL 7.0 (GLOVE) ×5 IMPLANT
GLOVE BIOGEL PI INDICATOR 7.0 (GLOVE) ×10
GLOVE SKINSENSE NS SZ8.0 LF (GLOVE) ×4
GLOVE SKINSENSE STRL SZ8.0 LF (GLOVE) ×2 IMPLANT
GLOVE SS N UNI LF 8.5 STRL (GLOVE) ×3 IMPLANT
GOWN STRL REUS W/ TWL LRG LVL3 (GOWN DISPOSABLE) ×1 IMPLANT
GOWN STRL REUS W/TWL LRG LVL3 (GOWN DISPOSABLE) ×11 IMPLANT
GOWN STRL REUS W/TWL XL LVL3 (GOWN DISPOSABLE) ×3 IMPLANT
HANDPIECE INTERPULSE COAX TIP (DISPOSABLE) ×2
HOOD W/PEELAWAY (MISCELLANEOUS) ×15 IMPLANT
INST SET MAJOR BONE (KITS) ×3 IMPLANT
IV NS IRRIG 3000ML ARTHROMATIC (IV SOLUTION) ×3 IMPLANT
KIT BLADEGUARD II DBL (SET/KITS/TRAYS/PACK) ×3 IMPLANT
KIT ROOM TURNOVER APOR (KITS) ×3 IMPLANT
MANIFOLD NEPTUNE II (INSTRUMENTS) ×3 IMPLANT
MARKER SKIN DUAL TIP RULER LAB (MISCELLANEOUS) ×3 IMPLANT
NEEDLE HYPO 21X1.5 SAFETY (NEEDLE) ×3 IMPLANT
NS IRRIG 1000ML POUR BTL (IV SOLUTION) ×3 IMPLANT
PACK TOTAL JOINT (CUSTOM PROCEDURE TRAY) ×3 IMPLANT
PAD ARMBOARD 7.5X6 YLW CONV (MISCELLANEOUS) ×3 IMPLANT
PAD DANNIFLEX CPM (ORTHOPEDIC SUPPLIES) ×3 IMPLANT
SAW OSC TIP CART 19.5X105X1.3 (SAW) ×3 IMPLANT
SET BASIN LINEN APH (SET/KITS/TRAYS/PACK) ×3 IMPLANT
SET HNDPC FAN SPRY TIP SCT (DISPOSABLE) ×1 IMPLANT
STAPLER VISISTAT 35W (STAPLE) ×3 IMPLANT
SUT BRALON NAB BRD #1 30IN (SUTURE) ×6 IMPLANT
SUT MNCRL 0 VIOLET CTX 36 (SUTURE) ×2 IMPLANT
SUT MON AB 0 CT1 (SUTURE) ×6 IMPLANT
SUT MONOCRYL 0 CTX 36 (SUTURE) ×4
SYR 20CC LL (SYRINGE) ×6 IMPLANT
SYR 30ML LL (SYRINGE) ×9 IMPLANT
SYR BULB IRRIGATION 50ML (SYRINGE) ×3 IMPLANT
TOWEL OR 17X26 4PK STRL BLUE (TOWEL DISPOSABLE) ×3 IMPLANT
TOWER CARTRIDGE SMART MIX (DISPOSABLE) ×3 IMPLANT
TRAY FOLEY W/METER SILVER 16FR (SET/KITS/TRAYS/PACK) ×3 IMPLANT
WATER STERILE IRR 1000ML POUR (IV SOLUTION) ×6 IMPLANT
YANKAUER SUCT 12FT TUBE ARGYLE (SUCTIONS) ×3 IMPLANT

## 2017-06-16 NOTE — Anesthesia Postprocedure Evaluation (Signed)
Anesthesia Post Note  Patient: Christopher [name]  Procedure(s) Performed: LEFT TOTAL KNEE ARTHROPLASTY (Left )  Patient location during evaluation: PACU Anesthesia Type: General Level of consciousness: awake and alert, oriented and patient cooperative Pain management: pain level controlled Vital Signs Assessment: post-procedure vital signs reviewed and stable Respiratory status: spontaneous breathing and respiratory function stable Cardiovascular status: stable Postop Assessment: no apparent nausea or vomiting Anesthetic complications: no     Last Vitals:  Vitals:   06/16/17 1500 06/16/17 1515  BP: 114/61 118/62  Pulse: 67 (!) 56  Resp: 15 13  Temp:    SpO2: 96% 95%    Last Pain:  Vitals:   06/16/17 1515  TempSrc:   PainSc: 8                  ADAMS, AMY A

## 2017-06-16 NOTE — Anesthesia Preprocedure Evaluation (Signed)
Anesthesia Evaluation  Patient identified by MRN, date of birth, ID band Patient awake    Reviewed: Allergy & Precautions, H&P , NPO status , Patient's Chart, lab work & pertinent test results  History of Anesthesia Complications (+) PONV and history of anesthetic complications (did not like prolonged spinal)  Airway Mallampati: III  TM Distance: >3 FB Neck ROM: Full    Dental  (+) Edentulous Upper, Partial Lower   Pulmonary asthma , sleep apnea and Continuous Positive Airway Pressure Ventilation , COPD, Current Smoker,    breath sounds clear to auscultation       Cardiovascular negative cardio ROS   Rhythm:Regular Rate:Normal     Neuro/Psych  Headaches, PSYCHIATRIC DISORDERS Anxiety Depression  Neuromuscular disease    GI/Hepatic GERD  Medicated and Controlled,  Endo/Other  diabetes, Type 2  Renal/GU      Musculoskeletal  (+) Arthritis ,   Abdominal   Peds  Hematology   Anesthesia Other Findings   Reproductive/Obstetrics                             Anesthesia Physical Anesthesia Plan  ASA: III  Anesthesia Plan: General   Post-op Pain Management:    Induction: Intravenous, Rapid sequence and Cricoid pressure planned  PONV Risk Score and Plan:   Airway Management Planned: Oral ETT and Video Laryngoscope Planned  Additional Equipment:   Intra-op Plan:   Post-operative Plan: Extubation in OR  Informed Consent: I have reviewed the patients History and Physical, chart, labs and discussed the procedure including the risks, benefits and alternatives for the proposed anesthesia with the patient or authorized representative who has indicated his/her understanding and acceptance.     Plan Discussed with:   Anesthesia Plan Comments:         Anesthesia Quick Evaluation

## 2017-06-16 NOTE — Interval H&P Note (Signed)
History and Physical Interval Note:  06/16/2017 11:31 AM  Christopher [name]  has presented today for surgery, with the diagnosis of osteoarthritis, left knee pain unresponsive to nonoperative treatment  The various methods of treatment have been discussed with the patient and family. After consideration of risks, benefits and other options for treatment, the patient has consented to  Procedure(s): LEFT TOTAL KNEE ARTHROPLASTY (Left) as a surgical intervention .  The patient's history has been reviewed, patient examined, no change in status, stable for surgery.  I have reviewed the patient's chart and labs.  Questions were answered to the patient's satisfaction.     [name]

## 2017-06-16 NOTE — Op Note (Signed)
06/16/2017  1:53 PM  PATIENT:  Christopher [name]  61 y.o. male  PRE-OPERATIVE DIAGNOSIS:  osteoarthritis, left knee pain unresponsive to nonoperative treatment  POST-OPERATIVE DIAGNOSIS:  osteoarthritis, left knee pain unresponsive to nonoperative treatment  PROCEDURE:  Procedure(s): LEFT TOTAL KNEE ARTHROPLASTY (Left)  SURGEON:  Surgeon(s) and Role:    Carole Civil, MD - Primary  PHYSICIAN ASSISTANT:   ASSISTANTS: betty ashley and cynthia wrenn   ANESTHESIA:   general  EBL:  25 mL   BLOOD ADMINISTERED:none  DRAINS: hemovac   LOCAL MEDICATIONS USED:  MARCAINE   , Amount: 30 ml and OTHER exparel 20   SPECIMEN:  No Specimen  DISPOSITION OF SPECIMEN:  N/A  COUNTS:  YES  TOURNIQUET:   Total Tourniquet Time Documented: Thigh (Left) - 88 minutes Total: Thigh (Left) - 88 minutes   DICTATION: .Viviann Spare Dictation  PLAN OF CARE: Admit to inpatient   PATIENT DISPOSITION:  PACU - hemodynamically stable.   Delay start of Pharmacological VTE agent (>24hrs) due to surgical blood loss or risk of bleeding: not applicable  [id number] The patient was identified by 2 approved identification mechanism. The operative extremity was evaluated and found to be acceptable for surgical treatment today. The chart was reviewed. The surgical site was confirmed and marked.  The patient was taken to the operating room and given vancomycin. This is consistent with the SCIP medications.  The patient was given the following anesthetic: general   The patient was then placed supine on the operating table. A Foley catheter was inserted. The operative extremity left knee  was prepped and draped sterilely from the toes to the groin.  Timeout procedure was executed confirming the patient's name, surgical site, antibiotic administration, x-rays available, and implants available.  The operative limb (left knee),  was exsanguinated with a six-inch Esmarch and the tourniquet was inflated to 300  mmHg.  A straight midline incision was made and taken down to the extensor mechanism. A medial arthrotomy was performed. The patella was everted and the patellofemoral ligament was released. The anterior cruciate ligament and PCL were resected.  The anterior horns of the lateral and medial meniscus were resected. The medial soft tissue sleeve was elevated to the mid coronal plane.  A three-eighths inch drill bit was used to enter the femoral canal which was decompressed with suction and irrigation until clear. The distal femoral cutting guide was set for 11 mm distal resection,  5valgus alignment, for a  knee. The distal femur was resected and checked for flatness.  The Depuy Sigma sizing femoral guide was placed and the femur was sized to a size 4 . A 4-in-1 cutting block was placed along with collateral ligament retractors and the distal femoral cuts were completed.  The external alignment guide for the tibial resection was then applied to the distal and proximal tibia and set for anatomic slope along with 10 MM resection  from the  High side .   Rotational alignment was set using the malleolus, the tibial tubercle and the tibial spines.  The proximal tibia was resected residual menisci were removed. The tibia was sized using a base plate to a size  4 .   Spacer blocks were used to confirm equal flexion extension gaps with releases done as needed. A size 10 MM spacer block id not fit so an additional 2 mm of bone taken. Then a 10 mm block  gave equal stability in  flexion extension.  The notch cutting guide for the  femur was then applied and the notch cut was made.  Trial reduction was completed using 31F 4T  trial implants. Patella tracking was normal  We then skeletonized the patella. It measured 25 in thickness and the patellar resection was set for 14 MILLI meters. the patellar resection was completed. The patella diameter measured 14. We then drilled the peg holes for the patella  The  proximal tibia was prepared using the size 4 base plate.  Thorough irrigation was performed and the bone was dried and prepared for cement. The cement was mixed on the back table using third generation preparation techniques  60 cc of dilute Exparel was injected into the soft tissues including the posterior capsule.  The implants were then cemented in place and excess cement was removed. The cement was allowed to cure. Irrigation was repeated and excess and residual bone fragments and cement were removed.  A medium-size Hemovac was placed in the joint with 6 openings in the tube left in the joint.  The extensor mechanism was closed with #1 Bralon suture followed by subcutaneous tissue closure using 0 Monocryl suture  30 cc of Marcaine with epinephrine was injected into the joint  Skin approximation was performed using staples  A sterile dressing was applied, followed by a TED hose and then a Cryo/Cuff which was activated   The patient was taken recovery room in stable condition

## 2017-06-16 NOTE — Brief Op Note (Signed)
06/16/2017  1:53 PM  PATIENT:  Christopher [name]  61 y.o. male  PRE-OPERATIVE DIAGNOSIS:  osteoarthritis, left knee pain unresponsive to nonoperative treatment  POST-OPERATIVE DIAGNOSIS:  osteoarthritis, left knee pain unresponsive to nonoperative treatment  PROCEDURE:  Procedure(s): LEFT TOTAL KNEE ARTHROPLASTY (Left)  SURGEON:  Surgeon(s) and Role:    Carole Civil, MD - Primary  PHYSICIAN ASSISTANT:   ASSISTANTS: betty ashley and cynthia wrenn   ANESTHESIA:   general  EBL:  25 mL   BLOOD ADMINISTERED:none  DRAINS: hemovac   LOCAL MEDICATIONS USED:  MARCAINE   , Amount: 30 ml and OTHER exparel 20   SPECIMEN:  No Specimen  DISPOSITION OF SPECIMEN:  N/A  COUNTS:  YES  TOURNIQUET:   Total Tourniquet Time Documented: Thigh (Left) - 88 minutes Total: Thigh (Left) - 88 minutes   DICTATION: .Viviann Spare Dictation  PLAN OF CARE: Admit to inpatient   PATIENT DISPOSITION:  PACU - hemodynamically stable.   Delay start of Pharmacological VTE agent (>24hrs) due to surgical blood loss or risk of bleeding: not applicable  [id number]

## 2017-06-16 NOTE — Transfer of Care (Signed)
Immediate Anesthesia Transfer of Care Note  Patient: Christopher [name]  Procedure(s) Performed: LEFT TOTAL KNEE ARTHROPLASTY (Left )  Patient Location: PACU  Anesthesia Type:General  Level of Consciousness: awake, oriented and patient cooperative  Airway & Oxygen Therapy: Patient Spontanous Breathing and Patient connected to face mask oxygen  Post-op Assessment: Report given to RN and Post -op Vital signs reviewed and stable  Post vital signs: Reviewed and stable  Last Vitals:  Vitals:   06/16/17 1110 06/16/17 1115  BP: (!) 100/41 (!) 97/39  Pulse:    Resp: (!) 66 (!) 25  Temp:    SpO2: 94% 93%    Last Pain:  Vitals:   06/16/17 0959  TempSrc: Oral  PainSc: 8       Patients Stated Pain Goal: 9 (76/72/09 4709)  Complications: No apparent anesthesia complications

## 2017-06-16 NOTE — Anesthesia Procedure Notes (Signed)
Procedure Name: Intubation Date/Time: 06/16/2017 11:53 AM Performed by: Andree Elk, AMY A Pre-anesthesia Checklist: Patient identified, Patient being monitored, Timeout performed, Emergency Drugs available and Suction available Patient Re-evaluated:Patient Re-evaluated prior to induction Oxygen Delivery Method: Circle System Utilized Preoxygenation: Pre-oxygenation with 100% oxygen Induction Type: IV induction, Cricoid Pressure applied and Rapid sequence Laryngoscope Size: Glidescope and 4 Grade View: Grade I Tube type: Oral Tube size: 7.0 mm Number of attempts: 1 Airway Equipment and Method: Stylet Placement Confirmation: ETT inserted through vocal cords under direct vision,  positive ETCO2 and breath sounds checked- equal and bilateral Secured at: 21 cm Tube secured with: Tape Dental Injury: Teeth and Oropharynx as per pre-operative assessment

## 2017-06-17 ENCOUNTER — Encounter (HOSPITAL_COMMUNITY): Payer: Self-pay | Admitting: Orthopedic Surgery

## 2017-06-17 NOTE — Progress Notes (Signed)
Received verbal order for weighty bearing as tolerated from Dr. Aline Brochure.

## 2017-06-17 NOTE — Care Management Note (Signed)
Case Management Note  Patient Details  Name: Christopher [name] MRN: [id number] Date of Birth: 01-Jul-1956  Subjective/Objective:      Adm with TKA Left. From home with wife, wife will be available at home post DC. Patient recommended for [facility] PT. He is agreeable. Home health was arranged with [facility] pre-operatively. Patient has RW and BSC pta. He would like a shower seat. Will order with [facility] per pt. Choice. CM verified with [facility] that they have received CPM order preoperatively. They will call patient to arrange for delivery at home.               Action/Plan: DC home with home health. Tim of [facility] aware of admission.   Expected Discharge Date:    06/19/2017              Expected Discharge Plan:  [place]  In-House Referral:     Discharge planning Services  CM Consult  Post Acute Care Choice:  Home Health, Durable Medical Equipment Choice offered to:  Patient  DME Arranged:    DME Agency:     HH Arranged:    [place]:  [facility] (now [facility])  Status of Service:  In process, will continue to follow  If discussed at Long Length of Stay Meetings, dates discussed:    Additional Comments:  Beverley Sherrard, Chauncey Reading, RN 06/17/2017, 3:59 PM

## 2017-06-17 NOTE — Evaluation (Signed)
Occupational Therapy Evaluation Patient Details Name: Christopher [name] MRN: [id number] DOB: 1956-04-02 Today's Date: 06/17/2017    History of Present Illness s/p Left TKA on 06/16/17.    Clinical Impression   Pt received supine in bed, agreeable to OT evaluation. Pt performing bed mobility and transfers with supervision-min guard this am. Pt able to complete UB ADL tasks with set-up while seated, increased assistance required for LB tasks due to pain limiting mobility. No further OT services required as pt's wife will be available to assist as needed on discharge home.     Follow Up Recommendations  No OT follow up;Supervision/Assistance - 24 hour    Equipment Recommendations  Tub/shower seat       Precautions / Restrictions Precautions Precautions: Fall Precaution Comments: due to recent sx Restrictions Weight Bearing Restrictions: No      Mobility Bed Mobility Overal bed mobility: Needs Assistance Bed Mobility: Supine to Sit     Supine to sit: Supervision        Transfers Overall transfer level: Needs assistance Equipment used: Rolling walker (2 wheeled) Transfers: Sit to/from [facility] Sit to Stand: Min guard Stand pivot transfers: Min guard                ADL either performed or assessed with clinical judgement   ADL Overall ADL's : Needs assistance/impaired     Grooming: Set up;Sitting   Upper Body Bathing: Modified independent;Sitting   Lower Body Bathing: Moderate assistance;Sitting/lateral leans   Upper Body Dressing : Modified independent;Sitting   Lower Body Dressing: Moderate assistance;Sit to/from stand   Toilet Transfer: Minimal assistance;Ambulation;RW                   Vision Baseline Vision/History: No visual deficits Patient Visual Report: No change from baseline Vision Assessment?: No apparent visual deficits            Pertinent Vitals/Pain Pain Assessment: 0-10 Pain Score: 9  Pain Location:  left knee Pain Descriptors / Indicators: Aching;Sore Pain Intervention(s): Limited activity within patient's tolerance;Monitored during session;Repositioned     Hand Dominance Right   Extremity/Trunk Assessment Upper Extremity Assessment Upper Extremity Assessment: Overall WFL for tasks assessed   Lower Extremity Assessment Lower Extremity Assessment: Defer to PT evaluation   Cervical / Trunk Assessment Cervical / Trunk Assessment: Normal   Communication Communication Communication: No difficulties   Cognition Arousal/Alertness: Awake/alert Behavior During Therapy: WFL for tasks assessed/performed Overall Cognitive Status: Within Functional Limits for tasks assessed                                                Home Living Family/patient expects to be discharged to:: Private residence Living Arrangements: Spouse/significant other Available Help at Discharge: Family;Available 24 hours/day Type of Home: Mobile home Home Access: Stairs to enter Entrance Stairs-Number of Steps: 6 Entrance Stairs-Rails: Right;Left;Can reach both Home Layout: One level     Bathroom Shower/Tub: Teacher, early years/pre: Standard     Home Equipment: Environmental consultant - 2 wheels;Bedside commode;Cane - single point          Prior Functioning/Environment Level of Independence: Independent                 OT Problem List: Decreased activity tolerance;Impaired balance (sitting and/or standing);Pain    End of Session Equipment Utilized During Treatment: Gait belt;Rolling walker  Activity Tolerance: Patient tolerated treatment well Patient left: in chair;with call bell/phone within reach  OT Visit Diagnosis: Muscle weakness (generalized) (M62.81);Pain Pain - Right/Left: Left Pain - part of body: Knee                Time: 5697-9480 OT Time Calculation (min): 28 min Charges:  OT General Charges $OT Visit: 1 Visit OT Evaluation $OT Eval Low Complexity: [place], OTR/L  [phone] 06/17/2017, 8:13 AM

## 2017-06-17 NOTE — Clinical Social Work Note (Signed)
Patient is going home with HHPT and is recommended by PT for those services.   LCSW signing off.

## 2017-06-17 NOTE — Anesthesia Postprocedure Evaluation (Signed)
Anesthesia Post Note  Patient: FARREN NELLES  Procedure(s) Performed: LEFT TOTAL KNEE ARTHROPLASTY (Left )  Patient location during evaluation: Nursing Unit Anesthesia Type: General Level of consciousness: awake and alert, oriented and patient cooperative Pain control: Patient receiving Dilaudid for pain relief. Vital Signs Assessment: post-procedure vital signs reviewed and stable Respiratory status: spontaneous breathing Cardiovascular status: stable : Some mild nausea last night but better this morning. Anesthetic complications: no     Last Vitals:  Vitals:   06/16/17 2209 06/17/17 0533  BP: 112/61 (!) 147/63  Pulse: 68 75  Resp: 14 16  Temp: 36.7 C 36.8 C  SpO2: 100% 94%    Last Pain:  Vitals:   06/17/17 0600  TempSrc:   PainSc: 9                  Chrisotpher Rivero A

## 2017-06-17 NOTE — Care Management Important Message (Signed)
Important Message  Patient Details  Name: Christopher [name] MRN: [id number] Date of Birth: 18-Apr-1956   Medicare Important Message Given:  Yes    Breawna Montenegro, Chauncey Reading, RN 06/17/2017, 4:13 PM

## 2017-06-17 NOTE — Evaluation (Signed)
Physical Therapy Evaluation Patient Details Name: Christopher [name] MRN: [id number] DOB: 1956-03-13 Today's Date: 06/17/2017   History of Present Illness  Christopher [name] a 61 y.o.male s/p Left TKA, 06/16/17, who has had multiple cortisone injections, he's had oral medication including hydrocodone for pain is used icy hot. His knee pain is getting worse. He complains of a constant dull aching pain on the medial joint line of his left knee with occasional pain in the back of the knee. It should be noted that he has had lumbar disc surgery    Clinical Impression  Patient presented up in chair with LLE dangling, limited for walking in hallway due to increased left knee pain and tolerated CPM set up at -5 to 75 degrees.  Patient will benefit from continued physical therapy in hospital and recommended venue below to increase strength, balance, endurance for safe ADLs and gait.    Follow Up Recommendations Home health PT;Supervision - Intermittent    Equipment Recommendations       Recommendations for Other Services       Precautions / Restrictions Precautions Precautions: Fall Required Braces or Orthoses: Other Brace/Splint Restrictions Weight Bearing Restrictions: Yes LLE Weight Bearing: Weight bearing as tolerated      Mobility  Bed Mobility Overal bed mobility: Needs Assistance Bed Mobility: Supine to Sit;Sit to Supine     Supine to sit: Supervision Sit to supine: Supervision      Transfers Overall transfer level: Needs assistance Equipment used: Rolling walker (2 wheeled) Transfers: Sit to/from [facility] Sit to Stand: Supervision Stand pivot transfers: Supervision          Ambulation/Gait Ambulation/Gait assistance: Supervision Ambulation Distance (Feet): 35 Feet Assistive device: Rolling walker (2 wheeled) Gait Pattern/deviations: Decreased step length - left;Decreased stance time - left;Decreased stride length   Gait velocity  interpretation: Below normal speed for age/gender General Gait Details: Patient demonstrates slow slightly labored cadence with limited left heel to toe stepping due to increased pain  Stairs            Wheelchair Mobility    Modified Rankin (Stroke Patients Only)       Balance Overall balance assessment: Needs assistance Sitting-balance support: No upper extremity supported;Feet supported Sitting balance-Leahy Scale: Good     Standing balance support: Bilateral upper extremity supported;During functional activity Standing balance-Leahy Scale: Fair                               Pertinent Vitals/Pain Pain Score: 9  Pain Location: left knee Pain Descriptors / Indicators: Aching;Sore Pain Intervention(s): Limited activity within patient's tolerance;Monitored during session    [place] expects to be discharged to:: Private residence Living Arrangements: Spouse/significant other Available Help at Discharge: Family;Available 24 hours/day Type of Home: Mobile home Home Access: Stairs to enter Entrance Stairs-Rails: Right;Left;Can reach both   Home Layout: One level Home Equipment: Walker - 2 wheels;Bedside commode;Cane - single point      Prior Function Level of Independence: Independent               Hand Dominance   Dominant Hand: Right    Extremity/Trunk Assessment   Upper Extremity Assessment Upper Extremity Assessment: Defer to OT evaluation    Lower Extremity Assessment Lower Extremity Assessment: Overall WFL for tasks assessed;LLE deficits/detail LLE Deficits / Details: grossly 3+/5    Cervical / Trunk Assessment Cervical / Trunk Assessment: Normal  Communication   Communication:  No difficulties  Cognition Arousal/Alertness: Awake/alert Behavior During Therapy: WFL for tasks assessed/performed Overall Cognitive Status: Within Functional Limits for tasks assessed                                         General Comments      Exercises Total Joint Exercises Ankle Circles/Pumps: AROM;Strengthening;Both;10 reps Quad Sets: AROM;Strengthening;Both;5 reps Gluteal Sets: AROM;Strengthening;Both;5 reps Short Arc Quad: AAROM;Strengthening;Left;10 reps Heel Slides: AAROM;Left;10 reps Goniometric ROM: left knee PROM:  7-95 degrees   Assessment/Plan    PT Assessment Patient needs continued PT services  PT Problem List Decreased strength;Decreased range of motion;Decreased activity tolerance;Decreased balance;Decreased mobility       PT Treatment Interventions Gait training;Stair training;Functional mobility training;Therapeutic activities;Therapeutic exercise;Patient/family education    PT Goals (Current goals can be found in the Care Plan section)  Acute Rehab PT Goals Patient Stated Goal: Return home with less knee pain PT Goal Formulation: With patient Time For Goal Achievement: 06/20/17 Potential to Achieve Goals: Good    Frequency 7X/week   Barriers to discharge        Co-evaluation               AM-PAC PT "6 Clicks" Daily Activity  Outcome Measure Difficulty turning over in bed (including adjusting bedclothes, sheets and blankets)?: A Little Difficulty moving from lying on back to sitting on the side of the bed? : A Little Difficulty sitting down on and standing up from a chair with arms (e.g., wheelchair, bedside commode, etc,.)?: A Little Help needed moving to and from a bed to chair (including a wheelchair)?: A Little Help needed walking in hospital room?: A Little Help needed climbing 3-5 steps with a railing? : A Little 6 Click Score: 18    End of Session   Activity Tolerance: Patient tolerated treatment well Patient left: in bed;in CPM   PT Visit Diagnosis: Unsteadiness on feet (R26.81);Other abnormalities of gait and mobility (R26.89);Muscle weakness (generalized) (M62.81)    Time: 2297-9892 PT Time Calculation (min) (ACUTE ONLY): 39  min   Charges:   PT Evaluation $PT Eval Low Complexity: 1 Low PT Treatments $Gait Training: 8-22 mins $Therapeutic Activity: 23-37 mins   PT G Codes:        1:20 PM, 2017/07/16 Lonell Grandchild, MPT Physical Therapist with [facility] 336 [phone] office [phone] mobile phone

## 2017-06-17 NOTE — Addendum Note (Signed)
Addendum  created 06/17/17 0750 by Mickel Baas, CRNA   Anesthesia Intra Meds edited, Sign clinical note

## 2017-06-17 NOTE — Progress Notes (Signed)
Patient ID: Christopher [name], male   DOB: 1956-09-01, 61 y.o.   MRN: [id number] Postop day 1 left total knee  He has a history of lumbar disc surgery he has some opioid tolerance He has sleep apnea  BP (!) 147/63 (BP Location: Left Arm)   Pulse 75   Temp 98.2 F (36.8 C) (Oral)   Resp 16   Ht 5\' 6"  (1.676 m)   Wt 221 lb (100.2 kg)   SpO2 94%   BMI 35.67 kg/m   Pain is controlled today on current regimen. He does complain of pain in his left knee  CBC Latest Ref Rng & Units 06/11/2017 09/03/2016 05/01/2014  WBC 4.0 - 10.5 K/uL 8.6 12.3(H) -  Hemoglobin 13.0 - 17.0 g/dL 13.7 14.5 13.5  Hematocrit 39.0 - 52.0 % 40.5 42.4 38.6(L)  Platelets 150 - 400 K/uL 210 248 -    BMP Latest Ref Rng & Units 06/11/2017 09/03/2016 08/28/2015  Glucose 65 - 99 mg/dL 128(H) 141(H) -  BUN 6 - 20 mg/dL 19 13 -  Creatinine 0.61 - 1.24 mg/dL 0.96 1.05 1.00  Sodium 135 - 145 mmol/L 135 135 -  Potassium 3.5 - 5.1 mmol/L 3.9 4.0 -  Chloride 101 - 111 mmol/L 101 101 -  CO2 22 - 32 mmol/L 23 27 -  Calcium 8.9 - 10.3 mg/dL 9.2 9.3 -    His labs look good his hemoglobin is a little bit above 128 so we will keep track of that to make sure it doesn't bump up any higher trying to keep it below 120.  Start PT plan discharge Friday

## 2017-06-18 MED ORDER — HYDROCODONE-ACETAMINOPHEN 10-325 MG PO TABS
1.0000 | ORAL_TABLET | ORAL | Status: DC
  Administered 2017-06-18 – 2017-06-19 (×6): 1 via ORAL
  Filled 2017-06-18 (×6): qty 1

## 2017-06-18 NOTE — Progress Notes (Signed)
Patient ID: [name], male   DOB: [birth year], 61 y.o.   MRN: [id number] BP 129/60 (BP Location: Right Arm)   Pulse 64   Temp 98.3 F (36.8 C) (Oral)   Resp 18   Ht 5\' 6"  (1.676 m)   Wt 221 lb (100.2 kg)   SpO2 98%   BMI 35.67 kg/m   CBC Latest Ref Rng & Units 06/11/2017 09/03/2016 05/01/2014  WBC 4.0 - 10.5 K/uL 8.6 12.3(H) -  Hemoglobin 13.0 - 17.0 g/dL 13.7 14.5 13.5  Hematocrit 39.0 - 52.0 % 40.5 42.4 38.6(L)  Platelets 150 - 400 K/uL 210 248 -   BMP Latest Ref Rng & Units 06/11/2017 09/03/2016 08/28/2015  Glucose 65 - 99 mg/dL 128(H) 141(H) -  BUN 6 - 20 mg/dL 19 13 -  Creatinine 0.61 - 1.24 mg/dL 0.96 1.05 1.00  Sodium 135 - 145 mmol/L 135 135 -  Potassium 3.5 - 5.1 mmol/L 3.9 4.0 -  Chloride 101 - 111 mmol/L 101 101 -  CO2 22 - 32 mmol/L 23 27 -  Calcium 8.9 - 10.3 mg/dL 9.2 9.3 -   Drain out   Added norco 10 mg    Wound clean   Dc tomorrow

## 2017-06-18 NOTE — Progress Notes (Signed)
Physical Therapy Treatment Patient Details Name: Christopher [name] MRN: [id number] DOB: 08/19/1956 Today's Date: 06/18/2017    History of Present Illness Christopher [name] a 61 y.o.male s/p Left TKA, 06/16/17, who has had multiple cortisone injections, he's had oral medication including hydrocodone for pain is used icy hot. His knee pain is getting worse. He complains of a constant dull aching pain on the medial joint line of his left knee with occasional pain in the back of the knee. It should be noted that he has had lumbar disc surgery    PT Comments    Patient agreeable for therapy and demonstrated increased tolerance for taking steps even with c/o severe pain.  Patient sat up in chair after gait training and later tolerated CPM -5 to 68 degrees, limited secondary to c/o increased pain.  Patient will benefit from continued physical therapy in hospital and recommended venue below to increase strength, balance, endurance for safe ADLs and gait.    Follow Up Recommendations  Home health PT;Supervision - Intermittent     Equipment Recommendations       Recommendations for Other Services       Precautions / Restrictions Precautions Precautions: Fall Precaution Comments: due to recent sx Required Braces or Orthoses: Knee Immobilizer - Left Knee Immobilizer - Left: On except when in CPM Restrictions Weight Bearing Restrictions: Yes LLE Weight Bearing: Weight bearing as tolerated    Mobility  Bed Mobility Overal bed mobility: Needs Assistance Bed Mobility: Supine to Sit;Sit to Supine     Supine to sit: Supervision Sit to supine: Supervision      Transfers Overall transfer level: Needs assistance Equipment used: Rolling walker (2 wheeled) Transfers: Sit to/from [facility] Sit to Stand: Supervision Stand pivot transfers: Supervision          Ambulation/Gait Ambulation/Gait assistance: Supervision Ambulation Distance (Feet): 50 Feet Assistive  device: Rolling walker (2 wheeled) Gait Pattern/deviations: Decreased step length - left;Decreased stance time - left;Decreased stride length   Gait velocity interpretation: Below normal speed for age/gender General Gait Details: Patient demonstrates increased endurance for gait trianing with improvement for touching left heel down, limited secondary to c/o increased knee pain   Stairs            Wheelchair Mobility    Modified Rankin (Stroke Patients Only)       Balance Overall balance assessment: Needs assistance Sitting-balance support: No upper extremity supported;Feet supported Sitting balance-Leahy Scale: Good     Standing balance support: Bilateral upper extremity supported;During functional activity Standing balance-Leahy Scale: Fair                              Cognition Arousal/Alertness: Awake/alert Behavior During Therapy: WFL for tasks assessed/performed Overall Cognitive Status: Within Functional Limits for tasks assessed                                        Exercises Total Joint Exercises Ankle Circles/Pumps: AROM;Strengthening;Both;10 reps Quad Sets: AROM;Strengthening;10 reps;Left Short Arc Quad: AAROM;Strengthening;Left;10 reps Heel Slides: Left;10 reps;AROM;Strengthening Knee Flexion: Seated;Left;PROM (self stretching using RLE to pull left knee into flexion x 30 sec holds x 2) Goniometric ROM: Left knee PROM: 12-98 degrees    General Comments        Pertinent Vitals/Pain Pain Score: 9  Pain Location: left knee Pain Descriptors / Indicators: Aching;Sore Pain  Intervention(s): Limited activity within patient's tolerance;Monitored during session    Home Living                      Prior Function            PT Goals (current goals can now be found in the care plan section) Acute Rehab PT Goals Patient Stated Goal: Return home with less knee pain PT Goal Formulation: With patient Time For Goal  Achievement: 06/20/17 Potential to Achieve Goals: Good Progress towards PT goals: Progressing toward goals    Frequency    7X/week      PT Plan Current plan remains appropriate    Co-evaluation              AM-PAC PT "6 Clicks" Daily Activity  Outcome Measure  Difficulty turning over in bed (including adjusting bedclothes, sheets and blankets)?: None Difficulty moving from lying on back to sitting on the side of the bed? : None Difficulty sitting down on and standing up from a chair with arms (e.g., wheelchair, bedside commode, etc,.)?: None Help needed moving to and from a bed to chair (including a wheelchair)?: A Little Help needed walking in hospital room?: A Little Help needed climbing 3-5 steps with a railing? : A Little 6 Click Score: 21    End of Session   Activity Tolerance: Patient tolerated treatment well;Patient limited by pain Patient left: in CPM;in bed;with call bell/phone within reach;with family/visitor present Nurse Communication: Mobility status PT Visit Diagnosis: Unsteadiness on feet (R26.81);Other abnormalities of gait and mobility (R26.89);Muscle weakness (generalized) (M62.81)     Time: 4076-8088 PT Time Calculation (min) (ACUTE ONLY): 27 min  Charges:  $Therapeutic Activity: 23-37 mins                    G Codes:       11:57 AM, 07-14-17 Lonell Grandchild, MPT Physical Therapist with [facility] 336 [phone] office [phone] mobile phone

## 2017-06-19 MED ORDER — METHOCARBAMOL 500 MG PO TABS: 500.0000 mg | ORAL_TABLET | Freq: Four times a day (QID) | ORAL | 1 refills | Status: DC | PRN

## 2017-06-19 MED ORDER — HYDROCODONE-ACETAMINOPHEN 10-325 MG PO TABS: 1.0000 | ORAL_TABLET | ORAL | 0 refills | Status: DC

## 2017-06-19 MED ORDER — APIXABAN 2.5 MG PO TABS: 2.5000 mg | ORAL_TABLET | Freq: Two times a day (BID) | ORAL | 0 refills | Status: DC

## 2017-06-19 NOTE — Progress Notes (Signed)
NURSING PROGRESS NOTE  Christopher [name] [id number] Discharge Data: 06/19/2017 10:58 AM Attending Provider: Carole Civil, MD ZYS:[id number], Percell Miller, MD     [name] to be D/C'd Home per MD order.  Discussed with the patient the After Visit Summary and all questions fully answered. All IV's discontinued with no bleeding noted. All belongings returned to patient for patient to take home. Patient sent with printed prescription for Norco.  Last Vital Signs:  Blood pressure 125/65, pulse 66, temperature 98 F (36.7 C), temperature source Oral, resp. rate 18, height 5\' 6"  (1.676 m), weight 100.2 kg (221 lb), SpO2 99 %.  Discharge Medication List Allergies as of 06/19/2017      Reactions   Celebrex [celecoxib] Itching, Swelling, Other (See Comments)   All over   Codeine Nausea And Vomiting, Other (See Comments)   Extreme stomach pain. This includes anything with the derivative of codeine in it.   Doxycycline Swelling, Other (See Comments)   Made tongue turn black    Cortisone Swelling   Oxycodone-acetaminophen Itching, Other (See Comments)   Can not  tolerate with benadryl    Prednisone Swelling   Relafen [nabumetone] Swelling      Medication List    STOP taking these medications   HYDROcodone-acetaminophen 5-325 MG tablet Commonly known as:  NORCO/VICODIN Replaced by:  HYDROcodone-acetaminophen 10-325 MG tablet     TAKE these medications   ALPRAZolam 0.5 MG tablet Commonly known as:  XANAX Take 0.5 mg by mouth at bedtime as needed for anxiety or sleep.   apixaban 2.5 MG Tabs tablet Commonly known as:  ELIQUIS Take 1 tablet (2.5 mg total) by mouth every 12 (twelve) hours.   ARCTIC RELIEF EX Apply 1 application topically as needed (for pain).   atorvastatin 20 MG tablet Commonly known as:  LIPITOR Take 20 mg by mouth every morning.   dicyclomine 10 MG capsule Commonly known as:  BENTYL Take 10 mg by mouth every other day.   HYDROcodone-acetaminophen 10-325  MG tablet Commonly known as:  NORCO Take 1 tablet by mouth every 4 (four) hours. Replaces:  HYDROcodone-acetaminophen 5-325 MG tablet   ibuprofen 800 MG tablet Commonly known as:  ADVIL,MOTRIN Take 1 tablet (800 mg total) by mouth every 8 (eight) hours as needed. What changed:  reasons to take this   methocarbamol 500 MG tablet Commonly known as:  ROBAXIN Take 1 tablet (500 mg total) by mouth every 6 (six) hours as needed for muscle spasms.   nitroGLYCERIN 0.4 MG SL tablet Commonly known as:  NITROSTAT Place 1 tablet (0.4 mg total) under the tongue every 5 (five) minutes as needed. What changed:  reasons to take this   pantoprazole 40 MG tablet Commonly known as:  PROTONIX Take 40 mg by mouth daily.   PROAIR HFA 108 (90 Base) MCG/ACT inhaler Generic drug:  albuterol Inhale 2 puffs into the lungs every 6 (six) hours as needed for wheezing or shortness of breath.   sertraline 50 MG tablet Commonly known as:  ZOLOFT Take 50 mg by mouth daily.   temazepam 30 MG capsule Commonly known as:  RESTORIL Take 30 mg by mouth at bedtime.            Durable Medical Equipment        Start     Ordered   06/19/17 0732  For home use only DME Walker  Once    Question:  Patient needs a walker to treat with the following condition  Answer:  History of left knee replacement   06/19/17 0731   06/19/17 0732  For home use only DME 3 n 1  Once     06/19/17 0731   06/19/17 0731  For Home Use Only DME CPM  Once    Question Answer Comment  Laterality Left Knee   Starting Flexion 0   Ending Flexion 85   Increase by Daily 10   Surgery Date 06/16/2017   CPM Started 06/17/2017      06/19/17 0731   06/18/17 1116  For home use only DME Shower stool  Once     06/18/17 1115       Discharge Care Instructions        Start     Ordered   06/19/17 0000  Change dressing    Comments:  Keep dressing on it please see the doctor   06/19/17 [phone]

## 2017-06-19 NOTE — Care Management (Signed)
Patient discharging home, Christopher [name] of [facility] aware. Patient's wife has spoken with [employer] and CPM will be delivered this afternoon. No other CM needs.

## 2017-06-19 NOTE — Discharge Summary (Signed)
Physician Discharge Summary  Patient ID: Christopher [name] MRN: [id number] DOB/AGE: November 18, 1955 61 y.o.  Admit date: 06/16/2017 Discharge date: 06/19/2017  Admission Diagnoses: Osteoarthritis left knee primary  Discharge Diagnoses: Same Active Problems:   Primary osteoarthritis of left knee   S/P knee replacement   Discharged Condition: good  Hospital Course:  Surgery was done on October 16. Gen. anesthesia was used to do a left total knee replacement with a [place] fixed-bearing Sigma PS knee no complications were noted  Patient's hospital course was marked by difficult pain management secondary to multiple allergies.  He did advance with physical therapy well with walking he had difficulties with knee extension his knee flexion was up to 95   BMP Latest Ref Rng & Units 06/11/2017 09/03/2016 08/28/2015  Glucose 65 - 99 mg/dL 128(H) 141(H) -  BUN 6 - 20 mg/dL 19 13 -  Creatinine 0.61 - 1.24 mg/dL 0.96 1.05 1.00  Sodium 135 - 145 mmol/L 135 135 -  Potassium 3.5 - 5.1 mmol/L 3.9 4.0 -  Chloride 101 - 111 mmol/L 101 101 -  CO2 22 - 32 mmol/L 23 27 -  Calcium 8.9 - 10.3 mg/dL 9.2 9.3 -   CBC Latest Ref Rng & Units 06/11/2017 09/03/2016 05/01/2014  WBC 4.0 - 10.5 K/uL 8.6 12.3(H) -  Hemoglobin 13.0 - 17.0 g/dL 13.7 14.5 13.5  Hematocrit 39.0 - 52.0 % 40.5 42.4 38.6(L)  Platelets 150 - 400 K/uL 210 248 -     Discharge Exam: Blood pressure 125/65, pulse 66, temperature 98 F (36.7 C), temperature source Oral, resp. rate 18, height 5\' 6"  (1.676 m), weight 221 lb (100.2 kg), SpO2 99 %. Yesterday his wound was clean dry and intact had mild swelling in his knee. He had difficulty with knee extension had good flexion. Neurovascular exam is intact no signs of DVT  Disposition: 01-Home or Self Care  Discharge Instructions    CPM    Complete by:  As directed    Continuous passive motion machine (CPM):     Use for 2 weeks     6 hours a day     Increase the range of motion 10 every day  as tolerated up to 120   Call MD / Call 911    Complete by:  As directed    If you experience chest pain or shortness of breath, CALL 911 and be transported to the hospital emergency room.  If you develope a fever above 101 F, pus (white drainage) or increased drainage or redness at the wound, or calf pain, call your surgeon's office.   Change dressing    Complete by:  As directed    Keep dressing on it please see the doctor   Constipation Prevention    Complete by:  As directed    Drink plenty of fluids.  Prune juice may be helpful.  You may use a stool softener, such as Colace (over the counter) 100 mg twice a day.  Use MiraLax (over the counter) for constipation as needed.   Diet - low sodium heart healthy    Complete by:  As directed    Do not put a pillow under the knee. Place it under the heel.    Complete by:  As directed    Increase activity slowly as tolerated    Complete by:  As directed    TED hose    Complete by:  As directed    Wear knee-high TED hose for 4 weeks  Allergies as of 06/19/2017      Reactions   Celebrex [celecoxib] Itching, Swelling, Other (See Comments)   All over   Codeine Nausea And Vomiting, Other (See Comments)   Extreme stomach pain. This includes anything with the derivative of codeine in it.   Doxycycline Swelling, Other (See Comments)   Made tongue turn black    Cortisone Swelling   Oxycodone-acetaminophen Itching, Other (See Comments)   Can not  tolerate with benadryl    Prednisone Swelling   Relafen [nabumetone] Swelling      Medication List    STOP taking these medications   HYDROcodone-acetaminophen 5-325 MG tablet Commonly known as:  NORCO/VICODIN Replaced by:  HYDROcodone-acetaminophen 10-325 MG tablet     TAKE these medications   ALPRAZolam 0.5 MG tablet Commonly known as:  XANAX Take 0.5 mg by mouth at bedtime as needed for anxiety or sleep.   apixaban 2.5 MG Tabs tablet Commonly known as:  ELIQUIS Take 1 tablet (2.5 mg  total) by mouth every 12 (twelve) hours.   ARCTIC RELIEF EX Apply 1 application topically as needed (for pain).   atorvastatin 20 MG tablet Commonly known as:  LIPITOR Take 20 mg by mouth every morning.   dicyclomine 10 MG capsule Commonly known as:  BENTYL Take 10 mg by mouth every other day.   HYDROcodone-acetaminophen 10-325 MG tablet Commonly known as:  NORCO Take 1 tablet by mouth every 4 (four) hours. Replaces:  HYDROcodone-acetaminophen 5-325 MG tablet   ibuprofen 800 MG tablet Commonly known as:  ADVIL,MOTRIN Take 1 tablet (800 mg total) by mouth every 8 (eight) hours as needed. What changed:  reasons to take this   methocarbamol 500 MG tablet Commonly known as:  ROBAXIN Take 1 tablet (500 mg total) by mouth every 6 (six) hours as needed for muscle spasms.   nitroGLYCERIN 0.4 MG SL tablet Commonly known as:  NITROSTAT Place 1 tablet (0.4 mg total) under the tongue every 5 (five) minutes as needed. What changed:  reasons to take this   pantoprazole 40 MG tablet Commonly known as:  PROTONIX Take 40 mg by mouth daily.   PROAIR HFA 108 (90 Base) MCG/ACT inhaler Generic drug:  albuterol Inhale 2 puffs into the lungs every 6 (six) hours as needed for wheezing or shortness of breath.   sertraline 50 MG tablet Commonly known as:  ZOLOFT Take 50 mg by mouth daily.   temazepam 30 MG capsule Commonly known as:  RESTORIL Take 30 mg by mouth at bedtime.            Durable Medical Equipment        Start     Ordered   06/18/17 1116  For home use only DME Shower stool  Once     06/18/17 1115       Discharge Care Instructions        Start     Ordered   06/19/17 0000  Change dressing    Comments:  Keep dressing on it please see the doctor   06/19/17 9629      wear your knee immobilizer at night  Signed: [name] 06/19/2017, 7:22 AM

## 2017-06-20 DIAGNOSIS — F17210 Nicotine dependence, cigarettes, uncomplicated: Secondary | ICD-10-CM | POA: Diagnosis not present

## 2017-06-20 DIAGNOSIS — F419 Anxiety disorder, unspecified: Secondary | ICD-10-CM | POA: Diagnosis not present

## 2017-06-20 DIAGNOSIS — Z471 Aftercare following joint replacement surgery: Secondary | ICD-10-CM | POA: Diagnosis not present

## 2017-06-20 DIAGNOSIS — Z79891 Long term (current) use of opiate analgesic: Secondary | ICD-10-CM | POA: Diagnosis not present

## 2017-06-20 DIAGNOSIS — Z7901 Long term (current) use of anticoagulants: Secondary | ICD-10-CM | POA: Diagnosis not present

## 2017-06-20 DIAGNOSIS — F329 Major depressive disorder, single episode, unspecified: Secondary | ICD-10-CM | POA: Diagnosis not present

## 2017-06-20 DIAGNOSIS — Z9181 History of falling: Secondary | ICD-10-CM | POA: Diagnosis not present

## 2017-06-20 DIAGNOSIS — Z96652 Presence of left artificial knee joint: Secondary | ICD-10-CM | POA: Diagnosis not present

## 2017-06-20 DIAGNOSIS — E785 Hyperlipidemia, unspecified: Secondary | ICD-10-CM | POA: Diagnosis not present

## 2017-06-20 DIAGNOSIS — E119 Type 2 diabetes mellitus without complications: Secondary | ICD-10-CM | POA: Diagnosis not present

## 2017-06-20 DIAGNOSIS — I10 Essential (primary) hypertension: Secondary | ICD-10-CM | POA: Diagnosis not present

## 2017-06-22 ENCOUNTER — Telehealth: Payer: Self-pay | Admitting: Orthopedic Surgery

## 2017-06-22 NOTE — Telephone Encounter (Signed)
Called to give Verbal order, Dr Aline Brochure has had [place] fax previously as well.

## 2017-06-22 NOTE — Telephone Encounter (Signed)
Call received from [facility] at [facility], requesting verbal orders for: physical therapy 1x per week for 1 week, then 3x per week x2 weeks. Phone# [phone]

## 2017-06-22 NOTE — Telephone Encounter (Signed)
Done

## 2017-06-24 LAB — TYPE AND SCREEN
ABO/RH(D): B POS
ANTIBODY SCREEN: NEGATIVE
UNIT DIVISION: 0
UNIT DIVISION: 0

## 2017-06-24 LAB — BPAM RBC
BLOOD PRODUCT EXPIRATION DATE: 201811152359
Blood Product Expiration Date: 201811162359
ISSUE DATE / TIME: 201810231804
UNIT TYPE AND RH: 5100
Unit Type and Rh: 1700

## 2017-06-25 ENCOUNTER — Telehealth: Payer: Self-pay | Admitting: Orthopedic Surgery

## 2017-06-25 NOTE — Telephone Encounter (Signed)
Hydrocodone-Acetaminophen 10/325 mg  Qty 30 Tablets  Take 1 tablet by mouth every 4 (four) hours.

## 2017-06-26 ENCOUNTER — Other Ambulatory Visit: Payer: Self-pay | Admitting: Orthopedic Surgery

## 2017-06-26 MED ORDER — HYDROCODONE-ACETAMINOPHEN 10-325 MG PO TABS: 1.0000 | ORAL_TABLET | ORAL | 0 refills | Status: DC

## 2017-06-29 ENCOUNTER — Ambulatory Visit (INDEPENDENT_AMBULATORY_CARE_PROVIDER_SITE_OTHER): Payer: Self-pay | Admitting: Orthopedic Surgery

## 2017-06-29 ENCOUNTER — Encounter: Payer: Self-pay | Admitting: Orthopedic Surgery

## 2017-06-29 VITALS — BP 144/67 | HR 79 | Ht 66.0 in | Wt 208.0 lb

## 2017-06-29 DIAGNOSIS — F419 Anxiety disorder, unspecified: Secondary | ICD-10-CM | POA: Diagnosis not present

## 2017-06-29 DIAGNOSIS — Z96652 Presence of left artificial knee joint: Secondary | ICD-10-CM | POA: Diagnosis not present

## 2017-06-29 DIAGNOSIS — F329 Major depressive disorder, single episode, unspecified: Secondary | ICD-10-CM | POA: Diagnosis not present

## 2017-06-29 DIAGNOSIS — I10 Essential (primary) hypertension: Secondary | ICD-10-CM | POA: Diagnosis not present

## 2017-06-29 DIAGNOSIS — Z79891 Long term (current) use of opiate analgesic: Secondary | ICD-10-CM | POA: Diagnosis not present

## 2017-06-29 DIAGNOSIS — Z7901 Long term (current) use of anticoagulants: Secondary | ICD-10-CM | POA: Diagnosis not present

## 2017-06-29 DIAGNOSIS — Z471 Aftercare following joint replacement surgery: Secondary | ICD-10-CM | POA: Diagnosis not present

## 2017-06-29 DIAGNOSIS — F17210 Nicotine dependence, cigarettes, uncomplicated: Secondary | ICD-10-CM | POA: Diagnosis not present

## 2017-06-29 DIAGNOSIS — E785 Hyperlipidemia, unspecified: Secondary | ICD-10-CM | POA: Diagnosis not present

## 2017-06-29 DIAGNOSIS — Z9181 History of falling: Secondary | ICD-10-CM | POA: Diagnosis not present

## 2017-06-29 DIAGNOSIS — Z4889 Encounter for other specified surgical aftercare: Secondary | ICD-10-CM

## 2017-06-29 DIAGNOSIS — E119 Type 2 diabetes mellitus without complications: Secondary | ICD-10-CM | POA: Diagnosis not present

## 2017-06-29 NOTE — Progress Notes (Signed)
p 

## 2017-06-29 NOTE — Progress Notes (Signed)
POST OP VISIT   Patient ID: Christopher [name], male   DOB: 15-May-1956, 61 y.o.   MRN: [id number]  Chief Complaint  Patient presents with  . Post-op Follow-up    13 days s/p surgery TKR 06/16/17    Encounter Diagnosis  Name Primary?  . Status post left knee replacement 06/16/17 Yes   [name] is doing well his pain is well controlled with Norco 10  His range of motion is 1-90 degrees  He walks well with and without his walker  Staples were removed and Steri-Strips were applied  He can start therapy next week and come back in 2-3 weeks

## 2017-07-02 ENCOUNTER — Other Ambulatory Visit (INDEPENDENT_AMBULATORY_CARE_PROVIDER_SITE_OTHER): Payer: Self-pay | Admitting: Internal Medicine

## 2017-07-07 ENCOUNTER — Ambulatory Visit (HOSPITAL_COMMUNITY): Payer: PPO | Attending: Orthopedic Surgery

## 2017-07-07 DIAGNOSIS — M25562 Pain in left knee: Secondary | ICD-10-CM | POA: Insufficient documentation

## 2017-07-07 DIAGNOSIS — M25662 Stiffness of left knee, not elsewhere classified: Secondary | ICD-10-CM | POA: Diagnosis not present

## 2017-07-07 NOTE — Therapy (Signed)
[facility] [address] [place], [place], [address] Phone: [phone]   Fax:  [phone]  Physical Therapy Evaluation  Patient Details  Name: Christopher [name] MRN: [id number] Date of Birth: 21-Jan-1956 Referring Provider: [name]    Encounter Date: 07/07/2017  PT End of Session - 07/07/17 1345    Visit Number  1    Number of Visits  16    Date for PT Re-Evaluation  08/06/17    Authorization Type  [employer]     Authorization Time Period  07/07/17-09/06/17    Authorization - Visit Number  1    Authorization - Number of Visits  10    PT Start Time  0539    PT Stop Time  1339    PT Time Calculation (min)  36 min    Activity Tolerance  Patient tolerated treatment well    Behavior During Therapy  [facility] for tasks assessed/performed       Past Medical History:  Diagnosis Date  . Anxiety   . Arthritis   . Asthma   . Complication of anesthesia    pt had a hard time being able to move after spinal anesthesia , 3-4 hours  . Depression   . Diabetes mellitus without complication ([place])    pt sts borderline  . GERD (gastroesophageal reflux disease)   . Headache(784.0)    after surgery  . Heart murmur    Years ago- not now  . Hyperlipidemia   . PONV (postoperative nausea and vomiting)   . Sleep apnea    uses CIPAP machine at night    Past Surgical History:  Procedure Laterality Date  . APPENDECTOMY    . BACK SURGERY     neck and back fusion  . CARDIAC CATHETERIZATION    . COLONOSCOPY  06/27/2011   Procedure: COLONOSCOPY;  Surgeon: Rogene Houston, MD;  Location: [facility];  Service: Endoscopy;  Laterality: N/A;  9:00 / Pt to be here at 9am for 10:45 procedure, benign polyps removed  . HERNIA REPAIR     umbilical hernia  . KNEE ARTHROSCOPY     left knee  . KNEE ARTHROSCOPY     right knee   . neck fusion    . SHOULDER SURGERY Right    Open [place]ford procedure  . SPINAL FUSION     x 2    There were no  vitals filed for this visit.   Subjective Assessment - 07/07/17 1305    Subjective  Pt underwent elective Lt TKA on 06/16/17. Prior to surgery, chronic knee pain that required drainage q 3 months, scope left knoee 6 years ago. Pt reports walking with limp, pain and weakness prior to surgery, but does not report and trouble with bednign or straightening.     Pertinent History  Prior to surgery, chronic knee pain that required drainage q 3 months, scope left knoee 6 years ago. Pt reports walking with limp, pain and weakness prior to surgery, but does not report and trouble with bednign or straightening.     How long can you sit comfortably?  10-20 minutes    How long can you stand comfortably?  20 minutes     How long can you walk comfortably?  <500 feet, but walks frequently.     Patient Stated Goals  improve ability to perform stairs up/down.     Currently in Pain?  Yes    Pain Score  5  Pain Location  -- knee, anterior   knee, anterior   Pain Descriptors / Indicators  Aching stiffness    stiffness    Pain Type  Surgical pain    Pain Onset  1 to 4 weeks ago    Aggravating Factors   not moving     Pain Relieving Factors  CPM, pain meds, icing          [facility] PT Assessment - 07/07/17 0001      Assessment   Medical Diagnosis  s/p Left TKA     Referring Provider  [name]     Onset Date/Surgical Date  06/16/17    Hand Dominance  Right    Next MD Visit  2nd week November     Prior Therapy  HHPT, now discharged      Precautions   Precautions  None      Balance Screen   Has the patient fallen in the past 6 months  No    Has the patient had a decrease in activity level because of a fear of falling?   No    Is the patient reluctant to leave their home because of a fear of falling?   No      Home Film/video editor residence    Living Arrangements  Spouse/significant other    Additional Comments  6 steps to enter mobile home      Prior Function    Level of [place]  Retired      Observation/Other Assessments   Focus on Therapeutic Outcomes (FOTO)   Foto 36 (64% impaired)       ROM / Strength   AROM / PROM / Strength  PROM      PROM   PROM Assessment Site  Knee    Right/Left Knee  Left    Left Knee Extension  20    Left Knee Flexion  116             Objective measurements completed on examination: See above findings.      [facility] Adult PT Treatment/Exercise - 07/07/17 0001      Transfers   Five time sit to stand comments   5xSTS: 13.40sec      Ambulation/Gait   Ambulation Distance (Feet)  450 Feet    Gait velocity  0.40m/s    Gait Comments  Left compensated trendelenburg      Exercises   Exercises  Knee/Hip      Knee/Hip Exercises: Seated   Long Arc Quad  15 reps;Left    Other Seated Knee/Hip Exercises  knee flexion 1x15    Marching  Left;1 set;15 reps      Knee/Hip Exercises: Supine   Heel Slides  AROM 4 minutes, 3sH flexion, 3sH quad set   4 minutes, 3sH flexion, 3sH quad set   Knee Extension  PROM    Knee Extension Limitations  20    Knee Flexion Limitations  116               PT Short Term Goals - 07/07/17 1356      PT SHORT TERM GOAL #1   Title  After 4 weeks patient will demonstrate improve ROM in knee AEB P/ROM 13-120 degrees    Status  New      PT SHORT TERM GOAL #2   Title  After 4 weeks patient will demonstrate improved strength in bilat hip stabilizers AEB SLS hold  time >15seconds bilat.      Status  New      PT SHORT TERM GOAL #3   Title  After 4 weeks patient will demonstrate improved activity tolerance AEB performance of 937ft AMB @ 1.89m/s or greater.     Status  New        PT Long Term Goals - 07/07/17 1358      PT LONG TERM GOAL #1   Title  After 8 weeks patient will demonstrate improve ROM in knee AEB P/ROM 8-124 degrees    Status  New      PT LONG TERM GOAL #2   Title  After 8 weeks patient will demonstrate improved strength in  bilat hip stabilizers AEB SLS hold time >30 seconds bilat.      Status  New      PT LONG TERM GOAL #3   Title  After 8 weeks patient will demonstrate improved power output AEB 30s Sit to Stand test >15x hands free.     Status  New      PT LONG TERM GOAL #4   Title  After 8 weeks patient will demonstrate improved quadricepts strength AEB Left 1rep max > or = 90% that of right quads to improve performance of stairs.     Status  New      PT LONG TERM GOAL #5   Title  After 8 weeks patient will demonstrate ability to access the community AEB 6MWT>1642feet.     Status  New             Plan - 07/07/17 1346    Clinical Impression Statement  Christopher [name] is a 61yo white male who presents to [facility] OP rehabilitation 3W s/p Left TKA. Pt demonstrates knee joint effusion with related capsular loss of mobility at end-range eflexion and extension (20-116 degrees P/ROM). Activation of hamstrings and quads appear WNL at this time. Pt has been working diligently on HEP at home with HHPT, as well as consistenty icing at home (educated to avoid over icing and to protect skin), and favorin gphysical movement of the knee for comfort. He has mild weakness in Left hip AEB compensated trendelenburg on the left side, likely a chronic impairment, with acute exacerbation. Activity tolerance is limited to <4 mnutes of AMB without increased pain, speed <1.3m/s. Power output is limited AEB 5xSTS in >13s.     History and Personal Factors relevant to plan of care:  very motivated, good muscle activation already.     Clinical Presentation  Stable    Clinical Presentation due to:  improving pain and ROM.     Clinical Decision Making  Moderate    Rehab Potential  Good    Clinical Impairments Affecting Rehab Potential  Great difficulty with extensin ROM thus far rather than flexion ROM.     PT Frequency  2x / week    PT Duration  8 weeks    PT Treatment/Interventions  Electrical Stimulation;Balance training;ADLs/Self  Care Home Management;Gait training;Stair training;Functional mobility training;Therapeutic activities;Therapeutic exercise;Dry needling;Passive range of motion;Scar mobilization;Compression bandaging;Energy conservation;Manual techniques    PT Next Visit Plan  Review HEP, exam, goals. Start loading quads and hamstrings. Genlte repetitive ROM to address capsular swelling, but avoid aggressive stretching until 2nd week of December     PT Home Exercise Plan  Heel slides, LAQ, marching in place (Passive flexion ROM), extension stretching, SLS hip abduction, Bridging     Consulted and Agree with Plan of Care  Patient  Patient will benefit from skilled therapeutic intervention in order to improve the following deficits and impairments:  Abnormal gait, Decreased endurance, Hypomobility, Increased edema, Decreased strength, Decreased activity tolerance, Decreased knowledge of use of DME, Decreased balance, Difficulty walking, Decreased mobility, Decreased range of motion, Improper body mechanics  Visit Diagnosis: Stiffness of left knee, not elsewhere classified - Plan: PT plan of care cert/re-cert  Acute pain of left knee - Plan: PT plan of care cert/re-cert  G-Codes - 54/62/70 1403    Functional Assessment Tool Used (Outpatient Only)  Foto 36 (64% impaired)     Functional Limitation  Mobility: Walking and moving around    Mobility: Walking and Moving Around Current Status [phone])  At least 60 percent but less than 80 percent impaired, limited or restricted    Mobility: Walking and Moving Around Goal Status [phone])  At least 1 percent but less than 20 percent impaired, limited or restricted        Problem List Patient Active Problem List   Diagnosis Date Noted  . S/P knee replacement left 06/16/17 06/16/2017  . Primary osteoarthritis of left knee   . Hyperlipidemia 09/04/2016  . Angina pectoris ([place]) 09/04/2016  . OSA (obstructive sleep apnea) 09/04/2016  . COPD (chronic obstructive  pulmonary disease) ([place]) 09/04/2016  . Rotator cuff tear 05/05/2014  . S/P shoulder surgery 09/26/2013  . Arthritis, shoulder region 09/26/2013  . Bursitis, shoulder 09/26/2013  . Synovitis of shoulder 09/26/2013  . Labral tear of shoulder, degenerative 09/26/2013  . Biceps tendon tear 09/26/2013  . Rotator cuff syndrome of left shoulder 06/21/2013  . Arthritis 06/21/2013  . Patellar tendinitis 03/29/2013  . Effusion of knee joint 03/29/2013  . Bursitis/tendonitis, shoulder 03/10/2013  . Effusion of knee joint, left 08/18/2011  . Knee pain 08/18/2011  . Acute torn meniscus 07/30/2011  . Old torn meniscus of knee 07/30/2011  . ARTHRITIS, LEFT KNEE 10/15/2010  . MEDIAL MENISCUS TEAR, RIGHT 10/15/2010  . HIP PAIN 01/15/2010  . DEGENERATIVE DISC DISEASE, LUMBOSACRAL SPINE W/RADICULOPATHY 01/15/2010  . PLICA SYNDROME 99/37/1696  . DERANGEMENT MENISCUS 07/09/2009  . JOINT EFFUSION, LEFT KNEE 07/09/2009  . KNEE, ARTHRITIS, DEGEN./OSTEO 01/30/2009  . KNEE PAIN 01/30/2009  . ANKLE SPRAIN, RIGHT 11/08/2007   2:07 PM, 07/07/17 Etta Grandchild, PT, DPT Physical Therapist at [place] [phone] (office)      Etta Grandchild 07/07/2017, 2:06 PM  [facility] [address] [place], [place], [address] Phone: [phone]   Fax:  [phone]  Name: Christopher [name] MRN: [id number] Date of Birth: 08-26-1956

## 2017-07-08 ENCOUNTER — Ambulatory Visit (HOSPITAL_COMMUNITY): Payer: PPO

## 2017-07-08 DIAGNOSIS — M25662 Stiffness of left knee, not elsewhere classified: Secondary | ICD-10-CM | POA: Diagnosis not present

## 2017-07-08 DIAGNOSIS — M25562 Pain in left knee: Secondary | ICD-10-CM

## 2017-07-08 NOTE — Therapy (Signed)
Christopher [name], [place], [address] Phone: [phone]   Fax:  [phone]  Physical Therapy Treatment  Patient Details  Name: Christopher [name] MRN: [id number] Date of Birth: May 23, 1956 Referring Provider: [name]    Encounter Date: 07/08/2017  PT End of Session - 07/08/17 1315    Visit Number  2    Number of Visits  16    Date for PT Re-Evaluation  08/06/17    Authorization Type  [employer]     Authorization Time Period  07/07/17-09/06/17    Authorization - Visit Number  2    Authorization - Number of Visits  10    PT Start Time  7782    PT Stop Time  1343    PT Time Calculation (min)  40 min    Activity Tolerance  Patient tolerated treatment well    Behavior During Therapy  [facility] for tasks assessed/performed       Past Medical History:  Diagnosis Date  . Anxiety   . Arthritis   . Asthma   . Complication of anesthesia    pt had a hard time being able to move after spinal anesthesia , 3-4 hours  . Depression   . Diabetes mellitus without complication ([place])    pt sts borderline  . GERD (gastroesophageal reflux disease)   . Headache(784.0)    after surgery  . Heart murmur    Years ago- not now  . Hyperlipidemia   . PONV (postoperative nausea and vomiting)   . Sleep apnea    uses CIPAP machine at night    Past Surgical History:  Procedure Laterality Date  . APPENDECTOMY    . BACK SURGERY     neck and back fusion  . CARDIAC CATHETERIZATION    . COLONOSCOPY  06/27/2011   Procedure: COLONOSCOPY;  Surgeon: Rogene Houston, MD;  [facility];  Service: Endoscopy;  Laterality: N/A;  9:00 / Pt to be here at 9am for 10:45 procedure, benign polyps removed  . HERNIA REPAIR     umbilical hernia  . KNEE ARTHROSCOPY     left knee  . KNEE ARTHROSCOPY     right knee   . neck fusion    . SHOULDER SURGERY Right    Open Mumford procedure  . SPINAL FUSION     x 2    There were no  vitals filed for this visit.  Subjective Assessment - 07/08/17 1306    Subjective  Pt doing well after EVAL yesterday, but definitely noticed soreness yesterday. Says straightening is improved.     Pertinent History  Prior to surgery, chronic knee pain that required drainage q 3 months, scope left knoee 6 years ago. Pt reports walking with limp, pain and weakness prior to surgery, but does not report and trouble with bednign or straightening.     Currently in Pain?  Yes    Pain Score  4     Pain Location  -- left knee   left knee                     [facility] Adult PT Treatment/Exercise - 07/08/17 0001      Ambulation/Gait   Ambulation Distance (Feet)  225 Feet    Gait velocity  0.37m/s     Gait Comments  [place] controlle dwell with [employer]       Exercises   Exercises  Knee/Hip  Knee/Hip Exercises: Stretches   Active Hamstring Stretch  2 reps;30 seconds heel on box   heel on box   Quad Stretch  10 seconds;Left 10x10sec knee on box   10x10sec knee on box   Gastroc Stretch  Both;2 reps;30 seconds    Other Knee/Hip Stretches  AA/ROm on NuStep: 4 minutes, Seat 8      Knee/Hip Exercises: Standing   Heel Raises  Both;2 sets;20 reps    Hip Abduction  Left;Stengthening;3 sets;10 reps    Abduction Limitations  2lbs    Other Standing Knee Exercises  Narrow Stance on Airex Foam: 3x30sec eyes open, arms crossed chest   eyes open, arms crossed chest     Knee/Hip Exercises: Seated   Long Arc Quad  Left;10 reps;Weights    Long Arc Quad Weight  3 lbs.    Sit to [place]  2 sets;10 reps shoulders in 90* flexion   shoulders in 90* flexion     Knee/Hip Exercises: Supine   Knee Extension  PROM    Knee Extension Limitations  18    Knee Flexion Limitations  113      Knee/Hip Exercises: Prone   Hamstring Curl  3 sets;10 reps    Prone Knee Hang  3 minutes (3x45sec)   (3x45sec)   Prone Knee Hang Weights (lbs)  0lb               PT Short Term Goals - 07/07/17 1356       PT SHORT TERM GOAL #1   Title  After 4 weeks patient will demonstrate improve ROM in knee AEB P/ROM 13-120 degrees    Status  New      PT SHORT TERM GOAL #2   Title  After 4 weeks patient will demonstrate improved strength in bilat hip stabilizers AEB SLS hold time >15seconds bilat.      Status  New      PT SHORT TERM GOAL #3   Title  After 4 weeks patient will demonstrate improved activity tolerance AEB performance of 931ft AMB @ 1.73m/s or greater.     Status  New        PT Long Term Goals - 07/07/17 1358      PT LONG TERM GOAL #1   Title  After 8 weeks patient will demonstrate improve ROM in knee AEB P/ROM 8-124 degrees    Status  New      PT LONG TERM GOAL #2   Title  After 8 weeks patient will demonstrate improved strength in bilat hip stabilizers AEB SLS hold time >30 seconds bilat.      Status  New      PT LONG TERM GOAL #3   Title  After 8 weeks patient will demonstrate improved power output AEB 30s Sit to Stand test >15x hands free.     Status  New      PT LONG TERM GOAL #4   Title  After 8 weeks patient will demonstrate improved quadricepts strength AEB Left 1rep max > or = 90% that of right quads to improve performance of stairs.     Status  New      PT LONG TERM GOAL #5   Title  After 8 weeks patient will demonstrate ability to access the community AEB 6MWT>1633feet.     Status  New            Plan - 07/08/17 1317    Clinical Impression Statement  Goals reviewed in full. Progressed  mobility and muscle activation work today with good tolerance. HEP compliacn egood thus far, but onyl  day since eval.    Rehab Potential  Good    Clinical Impairments Affecting Rehab Potential  Great difficulty with extension ROM thus far rather than flexion ROM.     PT Frequency  2x / week    PT Duration  8 weeks    PT Treatment/Interventions  Electrical Stimulation;Balance training;ADLs/Self Care Home Management;Gait training;Stair training;Functional mobility  training;Therapeutic activities;Therapeutic exercise;Dry needling;Passive range of motion;Scar mobilization;Compression bandaging;Energy conservation;Manual techniques    PT Next Visit Plan  continue moderate (65-70% 1RM) loading quads and hamstrings. progressive loading of hip abductors (80-90% 1RM) Gentle repetitive ROM to address capsular swelling, but avoid aggressive stretching until 2nd week of December     PT Home Exercise Plan  Heel slides, LAQ, marching in place (Passive flexion ROM), extension stretching, SLS hip abduction, Bridging        Patient will benefit from skilled therapeutic intervention in order to improve the following deficits and impairments:  Abnormal gait, Decreased endurance, Hypomobility, Increased edema, Decreased strength, Decreased activity tolerance, Decreased knowledge of use of DME, Decreased balance, Difficulty walking, Decreased mobility, Decreased range of motion, Improper body mechanics  Visit Diagnosis: Stiffness of left knee, not elsewhere classified  Acute pain of left knee   G-Codes - 12-Jul-2017 1403    Functional Assessment Tool Used (Outpatient Only)  Foto 36 (64% impaired)     Functional Limitation  Mobility: Walking and moving around    Mobility: Walking and Moving Around Current Status [phone])  At least 60 percent but less than 80 percent impaired, limited or restricted    Mobility: Walking and Moving Around Goal Status [phone])  At least 1 percent but less than 20 percent impaired, limited or restricted       Problem List Patient Active Problem List   Diagnosis Date Noted  . S/P knee replacement left 06/16/17 06/16/2017  . Primary osteoarthritis of left knee   . Hyperlipidemia 09/04/2016  . Angina pectoris ([place]) 09/04/2016  . OSA (obstructive sleep apnea) 09/04/2016  . COPD (chronic obstructive pulmonary disease) ([place]) 09/04/2016  . Rotator cuff tear 05/05/2014  . S/P shoulder surgery 09/26/2013  . Arthritis, shoulder region 09/26/2013  .  Bursitis, shoulder 09/26/2013  . Synovitis of shoulder 09/26/2013  . Labral tear of shoulder, degenerative 09/26/2013  . Biceps tendon tear 09/26/2013  . Rotator cuff syndrome of left shoulder 06/21/2013  . Arthritis 06/21/2013  . Patellar tendinitis 03/29/2013  . Effusion of knee joint 03/29/2013  . Bursitis/tendonitis, shoulder 03/10/2013  . Effusion of knee joint, left 08/18/2011  . Knee pain 08/18/2011  . Acute torn meniscus 07/30/2011  . Old torn meniscus of knee 07/30/2011  . ARTHRITIS, LEFT KNEE 10/15/2010  . MEDIAL MENISCUS TEAR, RIGHT 10/15/2010  . HIP PAIN 01/15/2010  . DEGENERATIVE DISC DISEASE, LUMBOSACRAL SPINE W/RADICULOPATHY 01/15/2010  . PLICA SYNDROME 01/77/9390  . DERANGEMENT MENISCUS 07/09/2009  . JOINT EFFUSION, LEFT KNEE 07/09/2009  . KNEE, ARTHRITIS, DEGEN./OSTEO 01/30/2009  . KNEE PAIN 01/30/2009  . ANKLE SPRAIN, RIGHT 11/08/2007   1:45 PM, 07/08/17 Etta Grandchild, PT, DPT Physical Therapist at [place] [phone] (office)      Etta Grandchild 07/08/2017, 1:45 PM  [facility] [address] [place], [place], [address] Phone: [phone]   Fax:  [phone]  Name: LEEVON UPPERMAN MRN: [id number] Date of Birth: April 22, 1956

## 2017-07-09 ENCOUNTER — Telehealth: Payer: Self-pay | Admitting: Orthopedic Surgery

## 2017-07-09 NOTE — Telephone Encounter (Signed)
Patient requests refill on Hydrocodone/Acetaminohen 10-325  Mgs.   Qty  30   Sig: Take 1 tablet by mouth every 4 (four) hours.

## 2017-07-10 ENCOUNTER — Ambulatory Visit (HOSPITAL_COMMUNITY): Payer: PPO

## 2017-07-10 ENCOUNTER — Other Ambulatory Visit: Payer: Self-pay | Admitting: Orthopedic Surgery

## 2017-07-10 MED ORDER — HYDROCODONE-ACETAMINOPHEN 7.5-325 MG PO TABS: 1.0000 | ORAL_TABLET | ORAL | 0 refills | Status: DC | PRN

## 2017-07-13 ENCOUNTER — Ambulatory Visit (HOSPITAL_COMMUNITY): Payer: PPO | Admitting: Physical Therapy

## 2017-07-13 ENCOUNTER — Encounter (HOSPITAL_COMMUNITY): Payer: Self-pay | Admitting: Physical Therapy

## 2017-07-13 DIAGNOSIS — M25662 Stiffness of left knee, not elsewhere classified: Secondary | ICD-10-CM | POA: Diagnosis not present

## 2017-07-13 DIAGNOSIS — M25562 Pain in left knee: Secondary | ICD-10-CM

## 2017-07-13 NOTE — Therapy (Signed)
[facility] [place], [place], [address] Phone: [phone]   Fax:  [phone]  Physical Therapy Treatment  Patient Details  Name: Christopher [name] MRN: [id number] Date of Birth: 01-24-1956 Referring Provider: [name]    Encounter Date: 07/13/2017  PT End of Session - 07/13/17 1818    Visit Number  3    Number of Visits  16    Date for PT Re-Evaluation  08/06/17    Authorization Type  [employer]     Authorization Time Period  07/07/17-09/06/17    Authorization - Visit Number  3    Authorization - Number of Visits  10    PT Start Time  6962    PT Stop Time  1515    PT Time Calculation (min)  40 min    Activity Tolerance  Patient tolerated treatment well    Behavior During Therapy  [facility] for tasks assessed/performed       Past Medical History:  Diagnosis Date  . Anxiety   . Arthritis   . Asthma   . Complication of anesthesia    pt had a hard time being able to move after spinal anesthesia , 3-4 hours  . Depression   . Diabetes mellitus without complication ([place])    pt sts borderline  . GERD (gastroesophageal reflux disease)   . Headache(784.0)    after surgery  . Heart murmur    Years ago- not now  . Hyperlipidemia   . PONV (postoperative nausea and vomiting)   . Sleep apnea    uses CIPAP machine at night    Past Surgical History:  Procedure Laterality Date  . APPENDECTOMY    . BACK SURGERY     neck and back fusion  . CARDIAC CATHETERIZATION    . COLONOSCOPY  06/27/2011   Procedure: COLONOSCOPY;  Surgeon: Rogene Houston, MD;  [facility];  Service: Endoscopy;  Laterality: N/A;  9:00 / Pt to be here at 9am for 10:45 procedure, benign polyps removed  . HERNIA REPAIR     umbilical hernia  . KNEE ARTHROSCOPY     left knee  . KNEE ARTHROSCOPY     right knee   . neck fusion    . SHOULDER SURGERY Right    Open Mumford procedure  . SPINAL FUSION     x 2    There were no  vitals filed for this visit.  Subjective Assessment - 07/13/17 1815    Subjective  Pt reports he is doing well, continues to get stiff but overall very little discomfort or pain.    Currently in Pain?  No/denies                      [facility] Adult PT Treatment/Exercise - 07/13/17 0001      Knee/Hip Exercises: Stretches   Active Hamstring Stretch  Left;2 reps;20 seconds;Limitations    Active Hamstring Stretch Limitations  12" step     Knee: Self-Stretch to increase Flexion  Left;10 seconds;Limitations    Knee: Self-Stretch Limitations  10 reps    Gastroc Stretch  Both;2 reps;30 seconds;Limitations    Gastroc Stretch Limitations  slant board      Knee/Hip Exercises: Standing   Heel Raises  Both;2 sets;20 reps    Hip Abduction  20 reps      Knee/Hip Exercises: Seated   Long Arc Quad  15 reps    Sit to [place]  2  sets;10 reps      Knee/Hip Exercises: Supine   Heel Slides  10 reps    Knee Extension  AROM    Knee Extension Limitations  10    Knee Flexion Limitations  114      Knee/Hip Exercises: Prone   Hamstring Curl  3 sets;10 reps    Prone Knee Hang  3 minutes;Limitations    Prone Knee Hang Weights (lbs)  0lb    Prone Knee Hang Limitations  manual completed during hang      Manual Therapy   Manual Therapy  Soft tissue mobilization;Myofascial release    Manual therapy comments  completed seperate from all other interventions    Soft tissue mobilization  to improve ROM    Myofascial Release  to decrease adhesions               PT Short Term Goals - 07/07/17 1356      PT SHORT TERM GOAL #1   Title  After 4 weeks patient will demonstrate improve ROM in knee AEB P/ROM 13-120 degrees    Status  New      PT SHORT TERM GOAL #2   Title  After 4 weeks patient will demonstrate improved strength in bilat hip stabilizers AEB SLS hold time >15seconds bilat.      Status  New      PT SHORT TERM GOAL #3   Title  After 4 weeks patient will demonstrate improved  activity tolerance AEB performance of 943ft AMB @ 1.73m/s or greater.     Status  New        PT Long Term Goals - 07/07/17 1358      PT LONG TERM GOAL #1   Title  After 8 weeks patient will demonstrate improve ROM in knee AEB P/ROM 8-124 degrees    Status  New      PT LONG TERM GOAL #2   Title  After 8 weeks patient will demonstrate improved strength in bilat hip stabilizers AEB SLS hold time >30 seconds bilat.      Status  New      PT LONG TERM GOAL #3   Title  After 8 weeks patient will demonstrate improved power output AEB 30s Sit to Stand test >15x hands free.     Status  New      PT LONG TERM GOAL #4   Title  After 8 weeks patient will demonstrate improved quadricepts strength AEB Left 1rep max > or = 90% that of right quads to improve performance of stairs.     Status  New      PT LONG TERM GOAL #5   Title  After 8 weeks patient will demonstrate ability to access the community AEB 6MWT>1668feet.     Status  New            Plan - 07/13/17 1818    Clinical Impression Statement  Continued with primary focus on ROM.  Extension remains most limited this session with added manual to tight ITB and distal hamstring while completing gravity assisted prone extension stretch.  Pt tolerated this well.  Manual also completed to decrease adhesions perimeter of knee in supine to improve mobility.  Increased ROM today 10-114 following (was 18-113 last session).  Continued with functional strengtheing wtih minimal cues for form.      Rehab Potential  Good    Clinical Impairments Affecting Rehab Potential  Great difficulty with extension ROM thus far rather than flexion ROM.  PT Frequency  2x / week    PT Duration  8 weeks    PT Treatment/Interventions  Electrical Stimulation;Balance training;ADLs/Self Care Home Management;Gait training;Stair training;Functional mobility training;Therapeutic activities;Therapeutic exercise;Dry needling;Passive range of motion;Scar  mobilization;Compression bandaging;Energy conservation;Manual techniques    PT Next Visit Plan  Continue wtih focus on ROM with progression of quad, hamstring and hip strength as able.     PT Home Exercise Plan  Heel slides, LAQ, marching in place (Passive flexion ROM), extension stretching, SLS hip abduction, Bridging        Patient will benefit from skilled therapeutic intervention in order to improve the following deficits and impairments:  Abnormal gait, Decreased endurance, Hypomobility, Increased edema, Decreased strength, Decreased activity tolerance, Decreased knowledge of use of DME, Decreased balance, Difficulty walking, Decreased mobility, Decreased range of motion, Improper body mechanics  Visit Diagnosis: Stiffness of left knee, not elsewhere classified  Acute pain of left knee     Problem List Patient Active Problem List   Diagnosis Date Noted  . S/P knee replacement left 06/16/17 06/16/2017  . Primary osteoarthritis of left knee   . Hyperlipidemia 09/04/2016  . Angina pectoris ([place]) 09/04/2016  . OSA (obstructive sleep apnea) 09/04/2016  . COPD (chronic obstructive pulmonary disease) ([place]) 09/04/2016  . Rotator cuff tear 05/05/2014  . S/P shoulder surgery 09/26/2013  . Arthritis, shoulder region 09/26/2013  . Bursitis, shoulder 09/26/2013  . Synovitis of shoulder 09/26/2013  . Labral tear of shoulder, degenerative 09/26/2013  . Biceps tendon tear 09/26/2013  . Rotator cuff syndrome of left shoulder 06/21/2013  . Arthritis 06/21/2013  . Patellar tendinitis 03/29/2013  . Effusion of knee joint 03/29/2013  . Bursitis/tendonitis, shoulder 03/10/2013  . Effusion of knee joint, left 08/18/2011  . Knee pain 08/18/2011  . Acute torn meniscus 07/30/2011  . Old torn meniscus of knee 07/30/2011  . ARTHRITIS, LEFT KNEE 10/15/2010  . MEDIAL MENISCUS TEAR, RIGHT 10/15/2010  . HIP PAIN 01/15/2010  . DEGENERATIVE DISC DISEASE, LUMBOSACRAL SPINE W/RADICULOPATHY 01/15/2010   . PLICA SYNDROME 64/40/3474  . DERANGEMENT MENISCUS 07/09/2009  . JOINT EFFUSION, LEFT KNEE 07/09/2009  . KNEE, ARTHRITIS, DEGEN./OSTEO 01/30/2009  . KNEE PAIN 01/30/2009  . ANKLE SPRAIN, RIGHT 11/08/2007    Teena Irani, PTA/CLT [phone]   Teena Irani 07/13/2017, 6:23 PM  [facility] [address] [place], [place], [address] Phone: [phone]   Fax:  [phone]  Name: Christopher [name] MRN: [id number] Date of Birth: 05-28-1956

## 2017-07-14 ENCOUNTER — Encounter: Payer: Self-pay | Admitting: Orthopedic Surgery

## 2017-07-14 ENCOUNTER — Ambulatory Visit (INDEPENDENT_AMBULATORY_CARE_PROVIDER_SITE_OTHER): Payer: Self-pay | Admitting: Orthopedic Surgery

## 2017-07-14 VITALS — BP 130/76 | HR 66 | Ht 66.0 in | Wt 208.0 lb

## 2017-07-14 DIAGNOSIS — Z96652 Presence of left artificial knee joint: Secondary | ICD-10-CM

## 2017-07-14 DIAGNOSIS — Z4889 Encounter for other specified surgical aftercare: Secondary | ICD-10-CM

## 2017-07-14 NOTE — Progress Notes (Signed)
Encounter Diagnoses  Name Primary?  . Status post left knee replacement 06/16/17 Yes  . Aftercare following surgery    Chief Complaint  Patient presents with  . Routine Post Op    06/16/17 increased pain since therapy yesterday good ROM   Christopher [name] is doing very well.  He had a little bit increased pain yesterday but nothing major.  He shows no signs of infection is walking well without assistive device he does have some aching pain at night but he is doing well with his pain medication and we will see him again in 4 weeks

## 2017-07-15 ENCOUNTER — Encounter (HOSPITAL_COMMUNITY): Payer: Self-pay

## 2017-07-15 ENCOUNTER — Ambulatory Visit (HOSPITAL_COMMUNITY): Payer: PPO

## 2017-07-15 DIAGNOSIS — M25562 Pain in left knee: Secondary | ICD-10-CM

## 2017-07-15 DIAGNOSIS — M25662 Stiffness of left knee, not elsewhere classified: Secondary | ICD-10-CM | POA: Diagnosis not present

## 2017-07-15 NOTE — Patient Instructions (Signed)
Knee Extension Mobilization: Hang (Prone)    With table supporting thighs, let feet hang off bed. Hold 3-10 minutes. Repeat 1-2 times per set.  http://orth.exer.us/722   Copyright  [employer]. All rights reserved.

## 2017-07-15 NOTE — Therapy (Signed)
[place], [place], [address] Phone: [phone]   Fax:  [phone]  Physical Therapy Treatment  Patient Details  Name: Christopher [name] MRN: [id number] Date of Birth: 09-Dec-1955 Referring Provider: [name]    Encounter Date: 07/15/2017  PT End of Session - 07/15/17 1308    Visit Number  4    Number of Visits  16    Date for PT Re-Evaluation  08/06/17    Authorization Type  [employer]     Authorization Time Period  07/07/17-09/06/17    Authorization - Visit Number  4    Authorization - Number of Visits  10    PT Start Time  1302    PT Stop Time  1346    PT Time Calculation (min)  44 min    Activity Tolerance  Patient tolerated treatment well    Behavior During Therapy  [facility] for tasks assessed/performed       Past Medical History:  Diagnosis Date  . Anxiety   . Arthritis   . Asthma   . Complication of anesthesia    pt had a hard time being able to move after spinal anesthesia , 3-4 hours  . Depression   . Diabetes mellitus without complication ([place])    pt sts borderline  . GERD (gastroesophageal reflux disease)   . Headache(784.0)    after surgery  . Heart murmur    Years ago- not now  . Hyperlipidemia   . PONV (postoperative nausea and vomiting)   . Sleep apnea    uses CIPAP machine at night    Past Surgical History:  Procedure Laterality Date  . APPENDECTOMY    . BACK SURGERY     neck and back fusion  . CARDIAC CATHETERIZATION    . COLONOSCOPY  06/27/2011   Procedure: COLONOSCOPY;  Surgeon: Rogene Houston, MD;  [facility];  Service: Endoscopy;  Laterality: N/A;  9:00 / Pt to be here at 9am for 10:45 procedure, benign polyps removed  . HERNIA REPAIR     umbilical hernia  . KNEE ARTHROSCOPY     left knee  . KNEE ARTHROSCOPY     right knee   . neck fusion    . SHOULDER SURGERY Right    Open Mumford procedure  . SPINAL FUSION     x 2    There were no  vitals filed for this visit.  Subjective Assessment - 07/15/17 1306    Subjective  Pt reports he went to MD yesterday, he is happy with progress.  Current knee pain at 4/10, mainly soreness.  Continues to complete HEP daily.  Reports most discomfort at night.      Pertinent History  Prior to surgery, chronic knee pain that required drainage q 3 months, scope left knoee 6 years ago. Pt reports walking with limp, pain and weakness prior to surgery, but does not report and trouble with bednign or straightening.     Patient Stated Goals  improve ability to perform stairs up/down.     Currently in Pain?  Yes    Pain Score  4     Pain Location  Knee    Pain Orientation  Left    Pain Descriptors / Indicators  Sore    Pain Type  Surgical pain    Pain Onset  1 to 4 weeks ago    Pain Frequency  Intermittent    Aggravating Factors   not  moving    Pain Relieving Factors  CPM, pain meds, icing         [facility] PT Assessment - 07/15/17 0001      Observation/Other Assessments-Edema    Edema  Circumferential      Circumferential Edema   Circumferential - Right  14.25 joint line    Circumferential - Left   15 joint line                  [facility] Adult PT Treatment/Exercise - 07/15/17 0001      Knee/Hip Exercises: Stretches   Active Hamstring Stretch  Left;3 reps;30 seconds    Active Hamstring Stretch Limitations  supine    Knee: Self-Stretch to increase Flexion  Left;10 seconds;Limitations    Knee: Self-Stretch Limitations  knee drive on 76PP step 50D 10"    Gastroc Stretch  Both;2 reps;30 seconds;Limitations    Gastroc Stretch Limitations  slant board      Knee/Hip Exercises: Standing   Heel Raises  20 reps    Knee Flexion  15 reps    Rocker Board  2 minutes equal weight distribution      Knee/Hip Exercises: Seated   Sit to Sand  10 reps eccentric control; cueing for equal weight distribution      Knee/Hip Exercises: Supine   Quad Sets  10 reps    Short Arc Quad Sets  15 reps     Heel Slides  10 reps    Knee Extension  AROM    Knee Extension Limitations  8    Knee Flexion Limitations  116      Knee/Hip Exercises: Prone   Hamstring Curl  15 reps    Prone Knee Hang  3 minutes;Limitations    Prone Knee Hang Limitations  manual completed during hang, added HEP      Manual Therapy   Manual Therapy  Edema management;Joint mobilization    Manual therapy comments  completed seperate from all other interventions    Edema Management  Retro massage for edema control with LE elevated    Joint Mobilization  patella mobs all direcitons and tib/fib    Soft tissue mobilization  to improve ROM    Myofascial Release  to decrease adhesions               PT Short Term Goals - 07/07/17 1356      PT SHORT TERM GOAL #1   Title  After 4 weeks patient will demonstrate improve ROM in knee AEB P/ROM 13-120 degrees    Status  New      PT SHORT TERM GOAL #2   Title  After 4 weeks patient will demonstrate improved strength in bilat hip stabilizers AEB SLS hold time >15seconds bilat.      Status  New      PT SHORT TERM GOAL #3   Title  After 4 weeks patient will demonstrate improved activity tolerance AEB performance of 957ft AMB @ 1.6m/s or greater.     Status  New        PT Long Term Goals - 07/07/17 1358      PT LONG TERM GOAL #1   Title  After 8 weeks patient will demonstrate improve ROM in knee AEB P/ROM 8-124 degrees    Status  New      PT LONG TERM GOAL #2   Title  After 8 weeks patient will demonstrate improved strength in bilat hip stabilizers AEB SLS hold time >30 seconds bilat.  Status  New      PT LONG TERM GOAL #3   Title  After 8 weeks patient will demonstrate improved power output AEB 30s Sit to Stand test >15x hands free.     Status  New      PT LONG TERM GOAL #4   Title  After 8 weeks patient will demonstrate improved quadricepts strength AEB Left 1rep max > or = 90% that of right quads to improve performance of stairs.     Status  New       PT LONG TERM GOAL #5   Title  After 8 weeks patient will demonstrate ability to access the community AEB 6MWT>1698feet.     Status  New            Plan - 07/15/17 1348    Clinical Impression Statement  Continued wiht primary focus iwth knee mobiltiy.  Noted edema present proximal knee, circumfirential measurements taken.  Began session with manual to address edema present proximal knee, joint mobs to improve mobility and MFR to address adhesions perimeter of knee.  Knee moility progressing well wiht AROM today at 8-116 (was 18-113 initial eval and 10-114 last session.)  Continued with functional strengtheing with min cueing for form, pt encouraged to play attention to equalize weight bearing for maximal benefits.      Rehab Potential  Good    Clinical Impairments Affecting Rehab Potential  Great difficulty with extension ROM thus far rather than flexion ROM.     PT Frequency  2x / week    PT Duration  8 weeks    PT Treatment/Interventions  Electrical Stimulation;Balance training;ADLs/Self Care Home Management;Gait training;Stair training;Functional mobility training;Therapeutic activities;Therapeutic exercise;Dry needling;Passive range of motion;Scar mobilization;Compression bandaging;Energy conservation;Manual techniques    PT Next Visit Plan  Continue wtih focus on ROM with progression of quad, hamstring and hip strength as able.     PT Home Exercise Plan  Heel slides, LAQ, marching in place (Passive flexion ROM), extension stretching, SLS hip abduction, Bridging        Patient will benefit from skilled therapeutic intervention in order to improve the following deficits and impairments:  Abnormal gait, Decreased endurance, Hypomobility, Increased edema, Decreased strength, Decreased activity tolerance, Decreased knowledge of use of DME, Decreased balance, Difficulty walking, Decreased mobility, Decreased range of motion, Improper body mechanics  Visit Diagnosis: Stiffness of left  knee, not elsewhere classified  Acute pain of left knee     Problem List Patient Active Problem List   Diagnosis Date Noted  . S/P knee replacement left 06/16/17 06/16/2017  . Primary osteoarthritis of left knee   . Hyperlipidemia 09/04/2016  . Angina pectoris ([place]) 09/04/2016  . OSA (obstructive sleep apnea) 09/04/2016  . COPD (chronic obstructive pulmonary disease) ([place]) 09/04/2016  . Rotator cuff tear 05/05/2014  . S/P shoulder surgery 09/26/2013  . Arthritis, shoulder region 09/26/2013  . Bursitis, shoulder 09/26/2013  . Synovitis of shoulder 09/26/2013  . Labral tear of shoulder, degenerative 09/26/2013  . Biceps tendon tear 09/26/2013  . Rotator cuff syndrome of left shoulder 06/21/2013  . Arthritis 06/21/2013  . Patellar tendinitis 03/29/2013  . Effusion of knee joint 03/29/2013  . Bursitis/tendonitis, shoulder 03/10/2013  . Effusion of knee joint, left 08/18/2011  . Knee pain 08/18/2011  . Acute torn meniscus 07/30/2011  . Old torn meniscus of knee 07/30/2011  . ARTHRITIS, LEFT KNEE 10/15/2010  . MEDIAL MENISCUS TEAR, RIGHT 10/15/2010  . HIP PAIN 01/15/2010  . DEGENERATIVE DISC DISEASE, LUMBOSACRAL SPINE  W/RADICULOPATHY 01/15/2010  . PLICA SYNDROME 80/22/3361  . DERANGEMENT MENISCUS 07/09/2009  . JOINT EFFUSION, LEFT KNEE 07/09/2009  . KNEE, ARTHRITIS, DEGEN./OSTEO 01/30/2009  . KNEE PAIN 01/30/2009  . ANKLE SPRAIN, RIGHT 11/08/2007   Christopher [name], LPTA; CBIS [phone]  Aldona Lento 07/15/2017, [place] [address] [place], [place], [address] Phone: [phone]   Fax:  [phone]  Name: JOFFRE LUCKS MRN: [id number] Date of Birth: 1956/02/06

## 2017-07-17 ENCOUNTER — Ambulatory Visit (HOSPITAL_COMMUNITY): Payer: PPO

## 2017-07-17 ENCOUNTER — Encounter (HOSPITAL_COMMUNITY): Payer: Self-pay

## 2017-07-17 DIAGNOSIS — M25562 Pain in left knee: Secondary | ICD-10-CM

## 2017-07-17 DIAGNOSIS — M25662 Stiffness of left knee, not elsewhere classified: Secondary | ICD-10-CM

## 2017-07-17 NOTE — Therapy (Signed)
[place], [place], [address] Phone: [phone]   Fax:  [phone]  Physical Therapy Treatment  Patient Details  Name: Christopher [name] MRN: [id number] Date of Birth: Jan 18, 1956 Referring Provider: [name]    Encounter Date: 07/17/2017  PT End of Session - 07/17/17 1309    Visit Number  5    Number of Visits  16    Date for PT Re-Evaluation  08/06/17    Authorization Type  [employer]     Authorization Time Period  07/07/17-09/06/17    Authorization - Visit Number  5    Authorization - Number of Visits  10    PT Start Time  3716    PT Stop Time  1344    PT Time Calculation (min)  41 min    Activity Tolerance  Patient tolerated treatment well;No increased pain    Behavior During Therapy  WFL for tasks assessed/performed       Past Medical History:  Diagnosis Date  . Anxiety   . Arthritis   . Asthma   . Complication of anesthesia    pt had a hard time being able to move after spinal anesthesia , 3-4 hours  . Depression   . Diabetes mellitus without complication ([place])    pt sts borderline  . GERD (gastroesophageal reflux disease)   . Headache(784.0)    after surgery  . Heart murmur    Years ago- not now  . Hyperlipidemia   . PONV (postoperative nausea and vomiting)   . Sleep apnea    uses CIPAP machine at night    Past Surgical History:  Procedure Laterality Date  . APPENDECTOMY    . BACK SURGERY     neck and back fusion  . BIOPSY  12/27/2015   Performed by Rogene Houston, MD at [place]  . CARDIAC CATHETERIZATION    . CHONDROPLASTY Left 08/15/2011   Performed by Carole Civil, MD at [facility]  . COLONOSCOPY  06/27/2011   Procedure: COLONOSCOPY;  Surgeon: Rogene Houston, MD;  Location[facility];  Service: Endoscopy;  Laterality: N/A;  9:00 / Pt to be here at 9am for 10:45 procedure, benign polyps removed  . COLONOSCOPY N/A 10/05/2014   Performed by Rogene Houston, MD at [place]  . COLONOSCOPY N/A 06/27/2011   Performed by Rogene Houston, MD at [place]  . ESOPHAGOGASTRODUODENOSCOPY (EGD) N/A 12/27/2015   Performed by Rogene Houston, MD at [place]  . HERNIA REPAIR     umbilical hernia  . KNEE ARTHROSCOPY     left knee  . KNEE ARTHROSCOPY     right knee   . KNEE ARTHROSCOPY WITH MEDIAL MENISECTOMY Left 08/15/2011   Performed by Carole Civil, MD at [facility]  . LEFT TOTAL KNEE ARTHROPLASTY Left 06/16/2017   Performed by Carole Civil, MD at [facility]  . LUMBAR LAMINECTOMY/DECOMPRESSION MICRODISCECTOMY 1 LEVEL Right 09/14/2012   Performed by Floyce Stakes, MD at [facility]  . neck fusion    . Right/Left Heart Cath and Coronary Angiography N/A 09/04/2016   Performed by Martinique, Peter M, MD at [place] [facility]  . ROTATOR CUFF REPAIR SHOULDER OPEN Left 05/05/2014   Performed by Carole Civil, MD at [facility]  . SHOULDER ARTHROSCOPY LIMITED DEBRIDEMENT Left 05/05/2014   Performed by Carole Civil, MD at [facility]  .  SHOULDER ARTHROSCOPY WITH BICEPSTENOTOMY AND EXTENSIVE DEBRIDEMENT Left 09/23/2013   Performed by Carole Civil, MD at [facility]  . SHOULDER SURGERY Right    Open Mumford procedure  . SPINAL FUSION     x 2    There were no vitals filed for this visit.  Subjective Assessment - 07/17/17 1308    Subjective  Pt stated knee is sore today, current pain scale 5/10. Reports compliance with HEP and ice application around 3x a day.      Pertinent History  Prior to surgery, chronic knee pain that required drainage q 3 months, scope left knoee 6 years ago. Pt reports walking with limp, pain and weakness prior to surgery, but does not report and trouble with bednign or straightening.     Patient Stated Goals  improve ability to perform stairs up/down.     Currently in Pain?  Yes    Pain Score  5     Pain Location  Knee    Pain Orientation  Left    Pain Descriptors / Indicators  Sore;Tightness    Pain Type   Surgical pain    Pain Onset  1 to 4 weeks ago    Pain Frequency  Intermittent    Aggravating Factors   not moving    Pain Relieving Factors  CPM, pain meds, icing                      [facility] Adult PT Treatment/Exercise - 07/17/17 0001      Knee/Hip Exercises: Stretches   Active Hamstring Stretch  Left;3 reps;30 seconds    Active Hamstring Stretch Limitations  supine    Gastroc Stretch  3 reps;30 seconds    Gastroc Stretch Limitations  slant board      Knee/Hip Exercises: Standing   Terminal Knee Extension  10 reps;Theraband    Theraband Level (Terminal Knee Extension)  Level 2 (Red)      Knee/Hip Exercises: Seated   Long Arc Quad  15 reps      Knee/Hip Exercises: Supine   Short Arc Quad Sets  15 reps    Heel Slides  10 reps    Bridges  15 reps    Knee Extension  AROM    Knee Extension Limitations  8    Knee Flexion Limitations  116      Knee/Hip Exercises: Prone   Hamstring Curl  15 reps    Prone Knee Hang  5 minutes    Prone Knee Hang Weights (lbs)  .    Prone Knee Hang Limitations  manual completed during hang    Other Prone Exercises  TKE 10x 5"      Manual Therapy   Manual Therapy  Edema management;Joint mobilization;Soft tissue mobilization    Manual therapy comments  completed seperate from all other interventions    Edema Management  Retro massage for edema control with LE elevated    Joint Mobilization  patella mobs all direcitons and tib/fib    Soft tissue mobilization  to improve ROM quad during retro massage and hamstring/gastroc during knee hang    Myofascial Release  to decrease adhesions               PT Short Term Goals - 07/07/17 1356      PT SHORT TERM GOAL #1   Title  After 4 weeks patient will demonstrate improve ROM in knee AEB P/ROM 13-120 degrees    Status  New  PT SHORT TERM GOAL #2   Title  After 4 weeks patient will demonstrate improved strength in bilat hip stabilizers AEB SLS hold time >15seconds bilat.       Status  New      PT SHORT TERM GOAL #3   Title  After 4 weeks patient will demonstrate improved activity tolerance AEB performance of 944ft AMB @ 1.70m/s or greater.     Status  New        PT Long Term Goals - 07/07/17 1358      PT LONG TERM GOAL #1   Title  After 8 weeks patient will demonstrate improve ROM in knee AEB P/ROM 8-124 degrees    Status  New      PT LONG TERM GOAL #2   Title  After 8 weeks patient will demonstrate improved strength in bilat hip stabilizers AEB SLS hold time >30 seconds bilat.      Status  New      PT LONG TERM GOAL #3   Title  After 8 weeks patient will demonstrate improved power output AEB 30s Sit to Stand test >15x hands free.     Status  New      PT LONG TERM GOAL #4   Title  After 8 weeks patient will demonstrate improved quadricepts strength AEB Left 1rep max > or = 90% that of right quads to improve performance of stairs.     Status  New      PT LONG TERM GOAL #5   Title  After 8 weeks patient will demonstrate ability to access the community AEB 6MWT>1626feet.     Status  New            Plan - 07/17/17 1333    Clinical Impression Statement  Session focus primarly with knee mobitliy.  Added TKE for quad strengthening to improve extension.  Continued with hip strengthening to improve gait mechanics. Manual technqiues to address edema and muscle restrictions to assist with mobility.  AROM 8-116 degrees at EOS and pain scale reduced to 2/10.    Rehab Potential  Good    Clinical Impairments Affecting Rehab Potential  Great difficulty with extension ROM thus far rather than flexion ROM.     PT Frequency  2x / week    PT Duration  8 weeks    PT Treatment/Interventions  Electrical Stimulation;Balance training;ADLs/Self Care Home Management;Gait training;Stair training;Functional mobility training;Therapeutic activities;Therapeutic exercise;Dry needling;Passive range of motion;Scar mobilization;Compression bandaging;Energy conservation;Manual  techniques    PT Next Visit Plan  Continue wtih focus on ROM with progression of quad, hamstring and hip strength as able.     PT Home Exercise Plan  Heel slides, LAQ, marching in place (Passive flexion ROM), extension stretching, SLS hip abduction, Bridging        Patient will benefit from skilled therapeutic intervention in order to improve the following deficits and impairments:  Abnormal gait, Decreased endurance, Hypomobility, Increased edema, Decreased strength, Decreased activity tolerance, Decreased knowledge of use of DME, Decreased balance, Difficulty walking, Decreased mobility, Decreased range of motion, Improper body mechanics  Visit Diagnosis: Stiffness of left knee, not elsewhere classified  Acute pain of left knee     Problem List Patient Active Problem List   Diagnosis Date Noted  . S/P knee replacement left 06/16/17 06/16/2017  . Primary osteoarthritis of left knee   . Hyperlipidemia 09/04/2016  . Angina pectoris ([place]) 09/04/2016  . OSA (obstructive sleep apnea) 09/04/2016  . COPD (chronic obstructive pulmonary disease) ([place])  09/04/2016  . Rotator cuff tear 05/05/2014  . S/P shoulder surgery 09/26/2013  . Arthritis, shoulder region 09/26/2013  . Bursitis, shoulder 09/26/2013  . Synovitis of shoulder 09/26/2013  . Labral tear of shoulder, degenerative 09/26/2013  . Biceps tendon tear 09/26/2013  . Rotator cuff syndrome of left shoulder 06/21/2013  . Arthritis 06/21/2013  . Patellar tendinitis 03/29/2013  . Effusion of knee joint 03/29/2013  . Bursitis/tendonitis, shoulder 03/10/2013  . Effusion of knee joint, left 08/18/2011  . Knee pain 08/18/2011  . Acute torn meniscus 07/30/2011  . Old torn meniscus of knee 07/30/2011  . ARTHRITIS, LEFT KNEE 10/15/2010  . MEDIAL MENISCUS TEAR, RIGHT 10/15/2010  . HIP PAIN 01/15/2010  . DEGENERATIVE DISC DISEASE, LUMBOSACRAL SPINE W/RADICULOPATHY 01/15/2010  . PLICA SYNDROME 41/74/0814  . DERANGEMENT MENISCUS  07/09/2009  . JOINT EFFUSION, LEFT KNEE 07/09/2009  . KNEE, ARTHRITIS, DEGEN./OSTEO 01/30/2009  . KNEE PAIN 01/30/2009  . ANKLE SPRAIN, RIGHT 11/08/2007   Ihor Austin, LPTA; CBIS [phone]  Aldona Lento 07/17/2017, 1:47 PM  [place] [address] [place], [place], [address] Phone: [phone]   Fax:  [phone]  Name: JAQUIS PICKLESIMER MRN: [id number] Date of Birth: 1955-09-14

## 2017-07-20 ENCOUNTER — Ambulatory Visit (HOSPITAL_COMMUNITY): Payer: PPO

## 2017-07-20 ENCOUNTER — Encounter (HOSPITAL_COMMUNITY): Payer: Self-pay

## 2017-07-20 DIAGNOSIS — M25662 Stiffness of left knee, not elsewhere classified: Secondary | ICD-10-CM | POA: Diagnosis not present

## 2017-07-20 DIAGNOSIS — M25562 Pain in left knee: Secondary | ICD-10-CM

## 2017-07-20 NOTE — Therapy (Signed)
Christopher [name], [place], [address] Phone: [phone]   Fax:  [phone]  Physical Therapy Treatment  Patient Details  Name: Christopher [name] MRN: [id number] Date of Birth: [birth year] Referring Provider: [name]    Encounter Date: 07/20/2017  PT End of Session - 07/20/17 1303    Visit Number  6    Number of Visits  16    Date for PT Re-Evaluation  08/06/17    Authorization Type  [employer]     Authorization Time Period  07/07/17-09/06/17    Authorization - Visit Number  6    Authorization - Number of Visits  10    PT Start Time  1300    PT Stop Time  3532    PT Time Calculation (min)  45 min    Activity Tolerance  Patient tolerated treatment well;No increased pain    Behavior During Therapy  WFL for tasks assessed/performed       Past Medical History:  Diagnosis Date  . Anxiety   . Arthritis   . Asthma   . Complication of anesthesia    pt had a hard time being able to move after spinal anesthesia , 3-4 hours  . Depression   . Diabetes mellitus without complication ([place])    pt sts borderline  . GERD (gastroesophageal reflux disease)   . Headache(784.0)    after surgery  . Heart murmur    Years ago- not now  . Hyperlipidemia   . PONV (postoperative nausea and vomiting)   . Sleep apnea    uses CIPAP machine at night    Past Surgical History:  Procedure Laterality Date  . APPENDECTOMY    . BACK SURGERY     neck and back fusion  . BIOPSY  12/27/2015   Performed by Rogene Houston, MD at [facility]  . CARDIAC CATHETERIZATION    . CHONDROPLASTY Left 08/15/2011   Performed by Carole Civil, MD at [facility]  . COLONOSCOPY  06/27/2011   Procedure: COLONOSCOPY;  Surgeon: Rogene Houston, MD;  [facility];  Service: Endoscopy;  Laterality: N/A;  9:00 / Pt to be here at 9am for 10:45 procedure, benign polyps removed  . COLONOSCOPY N/A 10/05/2014   Performed by Rogene Houston, MD at [facility]  . COLONOSCOPY N/A 06/27/2011   Performed by Rogene Houston, MD at [facility]  . ESOPHAGOGASTRODUODENOSCOPY (EGD) N/A 12/27/2015   Performed by Rogene Houston, MD at [facility]  . HERNIA REPAIR     umbilical hernia  . KNEE ARTHROSCOPY     left knee  . KNEE ARTHROSCOPY     right knee   . KNEE ARTHROSCOPY WITH MEDIAL MENISECTOMY Left 08/15/2011   Performed by Carole Civil, MD at [facility]  . LEFT TOTAL KNEE ARTHROPLASTY Left 06/16/2017   Performed by Carole Civil, MD at [facility]  . LUMBAR LAMINECTOMY/DECOMPRESSION MICRODISCECTOMY 1 LEVEL Right 09/14/2012   Performed by Floyce Stakes, MD at [facility]  . neck fusion    . Right/Left Heart Cath and Coronary Angiography N/A 09/04/2016   Performed by Martinique, Peter M, MD at [facility]  . ROTATOR CUFF REPAIR SHOULDER OPEN Left 05/05/2014   Performed by Carole Civil, MD at [facility]  . SHOULDER ARTHROSCOPY LIMITED DEBRIDEMENT Left 05/05/2014   Performed by Carole Civil, MD at [facility]  .  SHOULDER ARTHROSCOPY WITH BICEPSTENOTOMY AND EXTENSIVE DEBRIDEMENT Left 09/23/2013   Performed by Carole Civil, MD at [facility]  . SHOULDER SURGERY Right    Open Mumford procedure  . SPINAL FUSION     x 2    There were no vitals filed for this visit.  Subjective Assessment - 07/20/17 1302    Subjective  Pt reports that he was up on his feet on concrete for a long time Saturday and his knee is really sore today. He rates it a 7/10 currently.    Pertinent History  Prior to surgery, chronic knee pain that required drainage q 3 months, scope left knoee 6 years ago. Pt reports walking with limp, pain and weakness prior to surgery, but does not report and trouble with bednign or straightening.     Patient Stated Goals  improve ability to perform stairs up/down.     Currently in Pain?  Yes    Pain Score  7     Pain Location  Knee    Pain Orientation  Left    Pain Descriptors / Indicators   Sore;Tightness    Pain Type  Surgical pain    Pain Onset  1 to 4 weeks ago    Pain Frequency  Intermittent    Aggravating Factors   not moving    Pain Relieving Factors  CPM, pain meds, icing          [facility] Adult PT Treatment/Exercise - 07/20/17 0001      Knee/Hip Exercises: Stretches   Active Hamstring Stretch  Left;3 reps;30 seconds    Active Hamstring Stretch Limitations  12" step    Gastroc Stretch  3 reps;30 seconds    Gastroc Stretch Limitations  slant board      Knee/Hip Exercises: Aerobic   Stationary Bike  x4 mins at beginning of session for ROM, seat 10 (retro revolutions)      Knee/Hip Exercises: Standing   Heel Raises  Both;20 reps    Heel Raises Limitations  heel and toe    Terminal Knee Extension  Left;20 reps    Theraband Level (Terminal Knee Extension)  Level 2 (Red)    Terminal Knee Extension Limitations  5" holds    Lateral Step Up  Left;10 reps;Hand Hold: 2;Step Height: 4"    Forward Step Up  Left;10 reps;Hand Hold: 2;Step Height: 4"    Rocker Board  2 minutes    Rocker Board Limitations  R/L    SLS with Vectors  x5RT, on LLE only      Knee/Hip Exercises: Supine   Short Arc [employer]  Left;15 reps    Short Arc Quad Sets Limitations  2#, 3-5" sec holds    Knee Extension  AROM    Knee Extension Limitations  9    Knee Flexion Limitations  116      Knee/Hip Exercises: Prone   Hamstring Curl  15 reps    Hamstring Curl Limitations  2#    Other Prone Exercises  TKE 10x 5" with 2# weight for proprioception      Manual Therapy   Manual Therapy  Edema management;Joint mobilization    Manual therapy comments  completed seperate from all other interventions    Edema Management  Retro massage for edema control with LE elevated    Joint Mobilization  patella mobs sup/inf, anterior knee joint for extension ROM             PT Education - 07/20/17 1346  Education provided  Yes    Education Details  continue HEP and instructed on how to perform  self-patellar mobs and retro massage for edema    Person(s) Educated  Patient    Methods  Explanation;Demonstration    Comprehension  Returned demonstration;Verbalized understanding       PT Short Term Goals - 07/07/17 1356      PT SHORT TERM GOAL #1   Title  After 4 weeks patient will demonstrate improve ROM in knee AEB P/ROM 13-120 degrees    Status  New      PT SHORT TERM GOAL #2   Title  After 4 weeks patient will demonstrate improved strength in bilat hip stabilizers AEB SLS hold time >15seconds bilat.      Status  New      PT SHORT TERM GOAL #3   Title  After 4 weeks patient will demonstrate improved activity tolerance AEB performance of 934ft AMB @ 1.5m/s or greater.     Status  New        PT Long Term Goals - 07/07/17 1358      PT LONG TERM GOAL #1   Title  After 8 weeks patient will demonstrate improve ROM in knee AEB P/ROM 8-124 degrees    Status  New      PT LONG TERM GOAL #2   Title  After 8 weeks patient will demonstrate improved strength in bilat hip stabilizers AEB SLS hold time >30 seconds bilat.      Status  New      PT LONG TERM GOAL #3   Title  After 8 weeks patient will demonstrate improved power output AEB 30s Sit to Stand test >15x hands free.     Status  New      PT LONG TERM GOAL #4   Title  After 8 weeks patient will demonstrate improved quadricepts strength AEB Left 1rep max > or = 90% that of right quads to improve performance of stairs.     Status  New      PT LONG TERM GOAL #5   Title  After 8 weeks patient will demonstrate ability to access the community AEB 6MWT>1641feet.     Status  New            Plan - 07/20/17 1345    Clinical Impression Statement  Today's session focused on improving knee mobility, quad strengthening, and addressing edema and soft tissue restrictions. Pt tolerated well, not reporting any pain throughout session, just fatigue. Added step ups this date to progress quad strengthening in order to promote  extension. Pt's AROM 9 to 116 this date. Continue POC as planned.    Rehab Potential  Good    Clinical Impairments Affecting Rehab Potential  Great difficulty with extension ROM thus far rather than flexion ROM.     PT Frequency  2x / week    PT Duration  8 weeks    PT Treatment/Interventions  Electrical Stimulation;Balance training;ADLs/Self Care Home Management;Gait training;Stair training;Functional mobility training;Therapeutic activities;Therapeutic exercise;Dry needling;Passive range of motion;Scar mobilization;Compression bandaging;Energy conservation;Manual techniques    PT Next Visit Plan  Continue wtih focus on ROM with progression of quad, hamstring and hip strength as able; STM to proximal scar/distal quad for adhesions; continue joint mobs for extension    PT Home Exercise Plan  Heel slides, LAQ, marching in place (Passive flexion ROM), extension stretching, SLS hip abduction, Bridging        Patient will benefit from skilled therapeutic intervention  in order to improve the following deficits and impairments:  Abnormal gait, Decreased endurance, Hypomobility, Increased edema, Decreased strength, Decreased activity tolerance, Decreased knowledge of use of DME, Decreased balance, Difficulty walking, Decreased mobility, Decreased range of motion, Improper body mechanics  Visit Diagnosis: Stiffness of left knee, not elsewhere classified  Acute pain of left knee     Problem List Patient Active Problem List   Diagnosis Date Noted  . S/P knee replacement left 06/16/17 06/16/2017  . Primary osteoarthritis of left knee   . Hyperlipidemia 09/04/2016  . Angina pectoris ([place]) 09/04/2016  . OSA (obstructive sleep apnea) 09/04/2016  . COPD (chronic obstructive pulmonary disease) ([place]) 09/04/2016  . Rotator cuff tear 05/05/2014  . S/P shoulder surgery 09/26/2013  . Arthritis, shoulder region 09/26/2013  . Bursitis, shoulder 09/26/2013  . Synovitis of shoulder 09/26/2013  . Labral  tear of shoulder, degenerative 09/26/2013  . Biceps tendon tear 09/26/2013  . Rotator cuff syndrome of left shoulder 06/21/2013  . Arthritis 06/21/2013  . Patellar tendinitis 03/29/2013  . Effusion of knee joint 03/29/2013  . Bursitis/tendonitis, shoulder 03/10/2013  . Effusion of knee joint, left 08/18/2011  . Knee pain 08/18/2011  . Acute torn meniscus 07/30/2011  . Old torn meniscus of knee 07/30/2011  . ARTHRITIS, LEFT KNEE 10/15/2010  . MEDIAL MENISCUS TEAR, RIGHT 10/15/2010  . HIP PAIN 01/15/2010  . DEGENERATIVE DISC DISEASE, LUMBOSACRAL SPINE W/RADICULOPATHY 01/15/2010  . PLICA SYNDROME 37/85/8850  . DERANGEMENT MENISCUS 07/09/2009  . JOINT EFFUSION, LEFT KNEE 07/09/2009  . KNEE, ARTHRITIS, DEGEN./OSTEO 01/30/2009  . KNEE PAIN 01/30/2009  . ANKLE SPRAIN, RIGHT 11/08/2007        Geraldine Solar PT, DPT  [facility] [address] [place], [place], [address] Phone: [phone]   Fax:  [phone]  Name: Christopher [name] MRN: [id number] Date of Birth: [birth year]

## 2017-07-21 ENCOUNTER — Telehealth (HOSPITAL_COMMUNITY): Payer: Self-pay | Admitting: Pulmonary Disease

## 2017-07-21 NOTE — Telephone Encounter (Signed)
07/21/17  Wife called and left a message to cancel the Wed. appt but no reason was given

## 2017-07-22 ENCOUNTER — Encounter (HOSPITAL_COMMUNITY): Payer: Self-pay | Admitting: Physical Therapy

## 2017-07-27 ENCOUNTER — Ambulatory Visit (HOSPITAL_COMMUNITY): Payer: PPO

## 2017-07-27 ENCOUNTER — Encounter (HOSPITAL_COMMUNITY): Payer: Self-pay

## 2017-07-27 DIAGNOSIS — M25662 Stiffness of left knee, not elsewhere classified: Secondary | ICD-10-CM | POA: Diagnosis not present

## 2017-07-27 DIAGNOSIS — M25562 Pain in left knee: Secondary | ICD-10-CM

## 2017-07-27 NOTE — Therapy (Signed)
[place], [place], [address] Phone: [phone]   Fax:  [phone]  Physical Therapy Treatment  Patient Details  Name: Christopher [name] MRN: [id number] Date of Birth: June 05, 1956 Referring Provider: [name]    Encounter Date: 07/27/2017  PT End of Session - 07/27/17 1353    Visit Number  7    Number of Visits  16    Date for PT Re-Evaluation  08/06/17    Authorization Type  [employer]     Authorization Time Period  07/07/17-09/06/17    Authorization - Visit Number  7    Authorization - Number of Visits  10    PT Start Time  5681 pt late    PT Stop Time  1429    PT Time Calculation (min)  38 min    Activity Tolerance  Patient tolerated treatment well;No increased pain    Behavior During Therapy  WFL for tasks assessed/performed       Past Medical History:  Diagnosis Date  . Anxiety   . Arthritis   . Asthma   . Complication of anesthesia    pt had a hard time being able to move after spinal anesthesia , 3-4 hours  . Depression   . Diabetes mellitus without complication ([place])    pt sts borderline  . GERD (gastroesophageal reflux disease)   . Headache(784.0)    after surgery  . Heart murmur    Years ago- not now  . Hyperlipidemia   . PONV (postoperative nausea and vomiting)   . Sleep apnea    uses CIPAP machine at night    Past Surgical History:  Procedure Laterality Date  . APPENDECTOMY    . BACK SURGERY     neck and back fusion  . BIOPSY  12/27/2015   Procedure: BIOPSY;  Surgeon: Rogene Houston, MD;  [facility];  Service: Endoscopy;;  Fundus biopsies and duodenal biopsies  . CARDIAC CATHETERIZATION    . CARDIAC CATHETERIZATION N/A 09/04/2016   Procedure: Right/Left Heart Cath and Coronary Angiography;  Surgeon: Peter M Martinique, MD;  Location: [facility];  Service: Cardiovascular;  Laterality: N/A;  . CHONDROPLASTY  08/15/2011   Procedure: CHONDROPLASTY;   Surgeon: [name], MD;  Location: [facility];  Service: Orthopedics;  Laterality: Left;  . COLONOSCOPY  06/27/2011   Procedure: COLONOSCOPY;  Surgeon: Rogene Houston, MD;  [facility];  Service: Endoscopy;  Laterality: N/A;  9:00 / Pt to be here at 9am for 10:45 procedure, benign polyps removed  . COLONOSCOPY N/A 10/05/2014   Procedure: COLONOSCOPY;  Surgeon: Rogene Houston, MD;  [facility];  Service: Endoscopy;  Laterality: N/A;  930  . ESOPHAGOGASTRODUODENOSCOPY N/A 12/27/2015   Procedure: ESOPHAGOGASTRODUODENOSCOPY (EGD);  Surgeon: Rogene Houston, MD;  [facility];  Service: Endoscopy;  Laterality: N/A;  3:00  . HERNIA REPAIR     umbilical hernia  . KNEE ARTHROSCOPY     left knee  . KNEE ARTHROSCOPY     right knee   . LUMBAR LAMINECTOMY/DECOMPRESSION MICRODISCECTOMY  09/14/2012   Procedure: LUMBAR LAMINECTOMY/DECOMPRESSION MICRODISCECTOMY 1 LEVEL;  Surgeon: Floyce Stakes, MD;  Location: [facility];  Service: Neurosurgery;  Laterality: Right;  Right Lumbar three-four Diskectomy  . neck fusion    . SHOULDER ARTHROSCOPY WITH BICEPSTENOTOMY Left 09/23/2013   Procedure: SHOULDER ARTHROSCOPY WITH BICEPSTENOTOMY AND EXTENSIVE DEBRIDEMENT;  Surgeon: Carole Civil, MD;  Location:  [facility];  Service: Orthopedics;  Laterality: Left;  . SHOULDER ARTHROSCOPY WITH ROTATOR CUFF REPAIR Left 05/05/2014   Procedure: SHOULDER ARTHROSCOPY LIMITED DEBRIDEMENT;  Surgeon: Carole Civil, MD;  Location: [facility];  Service: Orthopedics;  Laterality: Left;  . SHOULDER OPEN ROTATOR CUFF REPAIR Left 05/05/2014   Procedure: ROTATOR CUFF REPAIR SHOULDER OPEN;  Surgeon: Carole Civil, MD;  Location: [facility];  Service: Orthopedics;  Laterality: Left;  . SHOULDER SURGERY Right    Open Mumford procedure  . SPINAL FUSION     x 2  . TOTAL KNEE ARTHROPLASTY Left 06/16/2017   Procedure: LEFT TOTAL KNEE ARTHROPLASTY;  Surgeon: Carole Civil, MD;  Location: [facility];   Service: Orthopedics;  Laterality: Left;    There were no vitals filed for this visit.  Subjective Assessment - 07/27/17 1352    Subjective  Pt sttes that he was on his feet a lot over the weekend and is having some achy pain. He rates it 5/10.    Pertinent History  Prior to surgery, chronic knee pain that required drainage q 3 months, scope left knoee 6 years ago. Pt reports walking with limp, pain and weakness prior to surgery, but does not report and trouble with bednign or straightening.     Patient Stated Goals  improve ability to perform stairs up/down.     Currently in Pain?  Yes    Pain Score  5     Pain Location  Knee    Pain Orientation  Left    Pain Type  Surgical pain    Pain Onset  1 to 4 weeks ago    Pain Frequency  Intermittent    Aggravating Factors   not moving    Pain Relieving Factors  CPM, pain meds, icing              [facility] Adult PT Treatment/Exercise - 07/27/17 0001      Knee/Hip Exercises: Stretches   Active Hamstring Stretch  Left;3 reps;30 seconds    Active Hamstring Stretch Limitations  12" step    Gastroc Stretch  3 reps;30 seconds    Gastroc Stretch Limitations  slant board      Knee/Hip Exercises: Aerobic   Stationary Bike  x4 mins at beginning of session for ROM, seat 10, fwd and retro revolutions      Knee/Hip Exercises: Standing   Knee Flexion  Left;15 reps    Knee Flexion Limitations  2#    Terminal Knee Extension  Left;20 reps    Theraband Level (Terminal Knee Extension)  Level 3 (Green)    Terminal Knee Extension Limitations  5" holds    Lateral Step Up  Left;15 reps;Hand Hold: 1;Step Height: 4"    Forward Step Up  Left;15 reps;Hand Hold: 2;Step Height: 4"    Step Down  Left;15 reps;Hand Hold: 2;Step Height: 4"    Functional Squat  20 reps    Functional Squat Limitations  cues for form    Other Standing Knee Exercises  L heel taps on 2" step 2x15 reps      Knee/Hip Exercises: Seated   Long Arc Quad  15 reps    Long Arc Quad  Weight  4 lbs.    Sit to Sand  20 reps;without UE support cues for proper weight shift over LLE      Knee/Hip Exercises: Supine   Short Arc [employer]  Left;15 reps    Short Arc Quad Sets Limitations  4#, 3-5" holds  Knee Extension  AROM      Manual Therapy   Manual Therapy  Edema management    Manual therapy comments  completed seperate from all other interventions    Edema Management  Retro massage for edema control with LE elevated               PT Short Term Goals - 07/07/17 1356      PT SHORT TERM GOAL #1   Title  After 4 weeks patient will demonstrate improve ROM in knee AEB P/ROM 13-120 degrees    Status  New      PT SHORT TERM GOAL #2   Title  After 4 weeks patient will demonstrate improved strength in bilat hip stabilizers AEB SLS hold time >15seconds bilat.      Status  New      PT SHORT TERM GOAL #3   Title  After 4 weeks patient will demonstrate improved activity tolerance AEB performance of 931ft AMB @ 1.24m/s or greater.     Status  New        PT Long Term Goals - 07/07/17 1358      PT LONG TERM GOAL #1   Title  After 8 weeks patient will demonstrate improve ROM in knee AEB P/ROM 8-124 degrees    Status  New      PT LONG TERM GOAL #2   Title  After 8 weeks patient will demonstrate improved strength in bilat hip stabilizers AEB SLS hold time >30 seconds bilat.      Status  New      PT LONG TERM GOAL #3   Title  After 8 weeks patient will demonstrate improved power output AEB 30s Sit to Stand test >15x hands free.     Status  New      PT LONG TERM GOAL #4   Title  After 8 weeks patient will demonstrate improved quadricepts strength AEB Left 1rep max > or = 90% that of right quads to improve performance of stairs.     Status  New      PT LONG TERM GOAL #5   Title  After 8 weeks patient will demonstrate ability to access the community AEB 6MWT>168feet.     Status  New            Plan - 07/27/17 1429    Clinical Impression Statement   Continued with focus on improving overall mobility, strength, and soft tissue restrictions of L knee. Progressed reps and resistance with exercises this date with good tolerance, he just demo'd fatigue by EOS. He did c/o L knee pain with heel taps on 4" step, so decreased to 2" step and he had much improved symptoms. Min cues required for proper technique. Measured pt's joint line edema which was 14.5" on the L and 13.5" on the R. Ended session to address edema. Pt's AROM 9 to 115 at EOS. Audible pop heard in knee when pt used RLE to bend knee back further; pt reported pain during pop but no pain following and ambulated out of clinic with no issues. Educated pt that he may be sore where his knee popped and he verbalized understanding.    Rehab Potential  Good    Clinical Impairments Affecting Rehab Potential  Great difficulty with extension ROM thus far rather than flexion ROM.     PT Frequency  2x / week    PT Duration  8 weeks    PT Treatment/Interventions  Electrical Stimulation;Balance training;ADLs/Self  Care Home Management;Gait training;Stair training;Functional mobility training;Therapeutic activities;Therapeutic exercise;Dry needling;Passive range of motion;Scar mobilization;Compression bandaging;Energy conservation;Manual techniques    PT Next Visit Plan  Continue wtih focus on ROM with progression of quad, hamstring and hip strength as able; STM to proximal scar/distal quad for adhesions; continue joint mobs for extension; possibly compression garment referral for lingering edema    PT Home Exercise Plan  Heel slides, LAQ, marching in place (Passive flexion ROM), extension stretching, SLS hip abduction, Bridging        Patient will benefit from skilled therapeutic intervention in order to improve the following deficits and impairments:  Abnormal gait, Decreased endurance, Hypomobility, Increased edema, Decreased strength, Decreased activity tolerance, Decreased knowledge of use of DME, Decreased  balance, Difficulty walking, Decreased mobility, Decreased range of motion, Improper body mechanics  Visit Diagnosis: Stiffness of left knee, not elsewhere classified  Acute pain of left knee     Problem List Patient Active Problem List   Diagnosis Date Noted  . S/P knee replacement left 06/16/17 06/16/2017  . Primary osteoarthritis of left knee   . Hyperlipidemia 09/04/2016  . Angina pectoris ([place]) 09/04/2016  . OSA (obstructive sleep apnea) 09/04/2016  . COPD (chronic obstructive pulmonary disease) ([place]) 09/04/2016  . Rotator cuff tear 05/05/2014  . S/P shoulder surgery 09/26/2013  . Arthritis, shoulder region 09/26/2013  . Bursitis, shoulder 09/26/2013  . Synovitis of shoulder 09/26/2013  . Labral tear of shoulder, degenerative 09/26/2013  . Biceps tendon tear 09/26/2013  . Rotator cuff syndrome of left shoulder 06/21/2013  . Arthritis 06/21/2013  . Patellar tendinitis 03/29/2013  . Effusion of knee joint 03/29/2013  . Bursitis/tendonitis, shoulder 03/10/2013  . Effusion of knee joint, left 08/18/2011  . Knee pain 08/18/2011  . Acute torn meniscus 07/30/2011  . Old torn meniscus of knee 07/30/2011  . ARTHRITIS, LEFT KNEE 10/15/2010  . MEDIAL MENISCUS TEAR, RIGHT 10/15/2010  . HIP PAIN 01/15/2010  . DEGENERATIVE DISC DISEASE, LUMBOSACRAL SPINE W/RADICULOPATHY 01/15/2010  . PLICA SYNDROME 60/63/0160  . DERANGEMENT MENISCUS 07/09/2009  . JOINT EFFUSION, LEFT KNEE 07/09/2009  . KNEE, ARTHRITIS, DEGEN./OSTEO 01/30/2009  . KNEE PAIN 01/30/2009  . ANKLE SPRAIN, RIGHT 11/08/2007      Geraldine Solar PT, DPT  [facility] [address] [place], [place], [address] Phone: [phone]   Fax:  [phone]  Name: Christopher [name] MRN: [id number] Date of Birth: 1956-04-15

## 2017-07-29 ENCOUNTER — Ambulatory Visit (HOSPITAL_COMMUNITY): Payer: PPO | Admitting: Physical Therapy

## 2017-07-29 DIAGNOSIS — M25562 Pain in left knee: Secondary | ICD-10-CM

## 2017-07-29 DIAGNOSIS — M25662 Stiffness of left knee, not elsewhere classified: Secondary | ICD-10-CM

## 2017-07-29 NOTE — Therapy (Signed)
[place], [place], [address] Phone: [phone]   Fax:  [phone]  Physical Therapy Treatment  Patient Details  Name: Christopher [name] MRN: [id number] Date of Birth: 05/16/1956 Referring Provider: [name]    Encounter Date: 07/29/2017  PT End of Session - 07/29/17 1403    Visit Number  8    Number of Visits  16    Date for PT Re-Evaluation  08/06/17    Authorization Type  [employer]     Authorization Time Period  07/07/17-09/06/17    Authorization - Visit Number  8    Authorization - Number of Visits  10    PT Start Time  2683    PT Stop Time  4196    PT Time Calculation (min)  52 min    Activity Tolerance  Patient tolerated treatment well;No increased pain    Behavior During Therapy  WFL for tasks assessed/performed       Past Medical History:  Diagnosis Date  . Anxiety   . Arthritis   . Asthma   . Complication of anesthesia    pt had a hard time being able to move after spinal anesthesia , 3-4 hours  . Depression   . Diabetes mellitus without complication ([place])    pt sts borderline  . GERD (gastroesophageal reflux disease)   . Headache(784.0)    after surgery  . Heart murmur    Years ago- not now  . Hyperlipidemia   . PONV (postoperative nausea and vomiting)   . Sleep apnea    uses CIPAP machine at night    Past Surgical History:  Procedure Laterality Date  . APPENDECTOMY    . BACK SURGERY     neck and back fusion  . BIOPSY  12/27/2015   Procedure: BIOPSY;  Surgeon: Rogene Houston, MD;  [facility];  Service: Endoscopy;;  Fundus biopsies and duodenal biopsies  . CARDIAC CATHETERIZATION    . CARDIAC CATHETERIZATION N/A 09/04/2016   Procedure: Right/Left Heart Cath and Coronary Angiography;  Surgeon: Peter M Martinique, MD;  Location: [facility];  Service: Cardiovascular;  Laterality: N/A;  . CHONDROPLASTY  08/15/2011   Procedure: CHONDROPLASTY;  Surgeon:  [name], MD;  Location: [facility];  Service: Orthopedics;  Laterality: Left;  . COLONOSCOPY  06/27/2011   Procedure: COLONOSCOPY;  Surgeon: Rogene Houston, MD;  [facility];  Service: Endoscopy;  Laterality: N/A;  9:00 / Pt to be here at 9am for 10:45 procedure, benign polyps removed  . COLONOSCOPY N/A 10/05/2014   Procedure: COLONOSCOPY;  Surgeon: Rogene Houston, MD;  [facility];  Service: Endoscopy;  Laterality: N/A;  930  . ESOPHAGOGASTRODUODENOSCOPY N/A 12/27/2015   Procedure: ESOPHAGOGASTRODUODENOSCOPY (EGD);  Surgeon: Rogene Houston, MD;  [facility];  Service: Endoscopy;  Laterality: N/A;  3:00  . HERNIA REPAIR     umbilical hernia  . KNEE ARTHROSCOPY     left knee  . KNEE ARTHROSCOPY     right knee   . LUMBAR LAMINECTOMY/DECOMPRESSION MICRODISCECTOMY  09/14/2012   Procedure: LUMBAR LAMINECTOMY/DECOMPRESSION MICRODISCECTOMY 1 LEVEL;  Surgeon: Floyce Stakes, MD;  Location: [facility];  Service: Neurosurgery;  Laterality: Right;  Right Lumbar three-four Diskectomy  . neck fusion    . SHOULDER ARTHROSCOPY WITH BICEPSTENOTOMY Left 09/23/2013   Procedure: SHOULDER ARTHROSCOPY WITH BICEPSTENOTOMY AND EXTENSIVE DEBRIDEMENT;  Surgeon: Carole Civil, MD;  [facility];  Service: Orthopedics;  Laterality: Left;  . SHOULDER ARTHROSCOPY WITH ROTATOR CUFF REPAIR Left 05/05/2014   Procedure: SHOULDER ARTHROSCOPY LIMITED DEBRIDEMENT;  Surgeon: Carole Civil, MD;  [facility];  Service: Orthopedics;  Laterality: Left;  . SHOULDER OPEN ROTATOR CUFF REPAIR Left 05/05/2014   Procedure: ROTATOR CUFF REPAIR SHOULDER OPEN;  Surgeon: Carole Civil, MD;  [facility];  Service: Orthopedics;  Laterality: Left;  . SHOULDER SURGERY Right    Open Mumford procedure  . SPINAL FUSION     x 2  . TOTAL KNEE ARTHROPLASTY Left 06/16/2017   Procedure: LEFT TOTAL KNEE ARTHROPLASTY;  Surgeon: Carole Civil, MD;  [facility];  Service:  Orthopedics;  Laterality: Left;    There were no vitals filed for this visit.  Subjective Assessment - 07/29/17 1317    Subjective  Pt states he is mostly just sore over his patellar tendon region.  States he is doing good on steps and all other activities have returned to normal.  States most dififculty with straightening.     Currently in Pain?  Yes    Pain Score  4     Pain Location  Knee    Pain Orientation  Left    Pain Descriptors / Indicators  Aching                      [facility] Adult PT Treatment/Exercise - 07/29/17 0001      Knee/Hip Exercises: Stretches   Active Hamstring Stretch  Left;3 reps;30 seconds    Active Hamstring Stretch Limitations  12" step    Gastroc Stretch  3 reps;30 seconds    Gastroc Stretch Limitations  slant board      Knee/Hip Exercises: Aerobic   Stationary Bike  x4 mins at beginning of session for ROM, seat 10, fwd and retro revolutions      Knee/Hip Exercises: Standing   Lateral Step Up  Left;15 reps;Hand Hold: 1;Step Height: 6"    Forward Step Up  Left;15 reps;Hand Hold: 2;Step Height: 6"    Step Down  Left;15 reps;Hand Hold: 2;Step Height: 6"    Functional Squat  20 reps      Knee/Hip Exercises: Supine   Short Arc Quad Sets  Left;15 reps    Knee Extension  AROM    Knee Extension Limitations  8    Knee Flexion Limitations  116      Manual Therapy   Manual Therapy  Edema management    Manual therapy comments  completed seperate from all other interventions    Edema Management  Retro massage for edema control with LE elevated    Joint Mobilization  patella mobs sup/inf, anterior knee joint for extension ROM    Soft tissue mobilization  to improve ROM quad during retro massage and hamstring/gastroc during knee hang    Myofascial Release  to decrease adhesions               PT Short Term Goals - 07/07/17 1356      PT SHORT TERM GOAL #1   Title  After 4 weeks patient will demonstrate improve ROM in knee AEB P/ROM  13-120 degrees    Status  New      PT SHORT TERM GOAL #2   Title  After 4 weeks patient will demonstrate improved strength in bilat hip stabilizers AEB SLS hold time >15seconds bilat.      Status  New      PT SHORT TERM  GOAL #3   Title  After 4 weeks patient will demonstrate improved activity tolerance AEB performance of 923ft AMB @ 1.49m/s or greater.     Status  New        PT Long Term Goals - 07/07/17 1358      PT LONG TERM GOAL #1   Title  After 8 weeks patient will demonstrate improve ROM in knee AEB P/ROM 8-124 degrees    Status  New      PT LONG TERM GOAL #2   Title  After 8 weeks patient will demonstrate improved strength in bilat hip stabilizers AEB SLS hold time >30 seconds bilat.      Status  New      PT LONG TERM GOAL #3   Title  After 8 weeks patient will demonstrate improved power output AEB 30s Sit to Stand test >15x hands free.     Status  New      PT LONG TERM GOAL #4   Title  After 8 weeks patient will demonstrate improved quadricepts strength AEB Left 1rep max > or = 90% that of right quads to improve performance of stairs.     Status  New      PT LONG TERM GOAL #5   Title  After 8 weeks patient will demonstrate ability to access the community AEB 6MWT>1612feet.     Status  New            Plan - 07/29/17 1403    Clinical Impression Statement  Overall progressing well with full return to functional activities without pain or difficulty.  Extension continues to be largest limitation, achieving 8 degrees following manual .  Most adhesions/scar tissue at distal end of scar over patellar tendon.      Rehab Potential  Good    Clinical Impairments Affecting Rehab Potential  Great difficulty with extension ROM thus far rather than flexion ROM.     PT Frequency  2x / week    PT Duration  8 weeks    PT Treatment/Interventions  Electrical Stimulation;Balance training;ADLs/Self Care Home Management;Gait training;Stair training;Functional mobility  training;Therapeutic activities;Therapeutic exercise;Dry needling;Passive range of motion;Scar mobilization;Compression bandaging;Energy conservation;Manual techniques    PT Next Visit Plan  Continue wtih focus on ROM with progression of quad, hamstring and hip strength as able; STM to proximal scar/distal quad for adhesions; continue joint mobs for extension; possibly compression garment referral for lingering edema    PT Home Exercise Plan  Heel slides, LAQ, marching in place (Passive flexion ROM), extension stretching, SLS hip abduction, Bridging        Patient will benefit from skilled therapeutic intervention in order to improve the following deficits and impairments:  Abnormal gait, Decreased endurance, Hypomobility, Increased edema, Decreased strength, Decreased activity tolerance, Decreased knowledge of use of DME, Decreased balance, Difficulty walking, Decreased mobility, Decreased range of motion, Improper body mechanics  Visit Diagnosis: Stiffness of left knee, not elsewhere classified  Acute pain of left knee     Problem List Patient Active Problem List   Diagnosis Date Noted  . S/P knee replacement left 06/16/17 06/16/2017  . Primary osteoarthritis of left knee   . Hyperlipidemia 09/04/2016  . Angina pectoris ([place]) 09/04/2016  . OSA (obstructive sleep apnea) 09/04/2016  . COPD (chronic obstructive pulmonary disease) ([place]) 09/04/2016  . Rotator cuff tear 05/05/2014  . S/P shoulder surgery 09/26/2013  . Arthritis, shoulder region 09/26/2013  . Bursitis, shoulder 09/26/2013  . Synovitis of shoulder 09/26/2013  . Labral tear  of shoulder, degenerative 09/26/2013  . Biceps tendon tear 09/26/2013  . Rotator cuff syndrome of left shoulder 06/21/2013  . Arthritis 06/21/2013  . Patellar tendinitis 03/29/2013  . Effusion of knee joint 03/29/2013  . Bursitis/tendonitis, shoulder 03/10/2013  . Effusion of knee joint, left 08/18/2011  . Knee pain 08/18/2011  . Acute torn  meniscus 07/30/2011  . Old torn meniscus of knee 07/30/2011  . ARTHRITIS, LEFT KNEE 10/15/2010  . MEDIAL MENISCUS TEAR, RIGHT 10/15/2010  . HIP PAIN 01/15/2010  . DEGENERATIVE DISC DISEASE, LUMBOSACRAL SPINE W/RADICULOPATHY 01/15/2010  . PLICA SYNDROME 82/95/6213  . DERANGEMENT MENISCUS 07/09/2009  . JOINT EFFUSION, LEFT KNEE 07/09/2009  . KNEE, ARTHRITIS, DEGEN./OSTEO 01/30/2009  . KNEE PAIN 01/30/2009  . ANKLE SPRAIN, RIGHT 11/08/2007   Teena Irani, PTA/CLT [phone]  Teena Irani 07/29/2017, 2:50 PM  [facility] [address] [place], [place], [address] Phone: [phone]   Fax:  [phone]  Name: KEVONTAY BURKS MRN: [id number] Date of Birth: May 29, 1956

## 2017-07-30 ENCOUNTER — Other Ambulatory Visit: Payer: Self-pay | Admitting: Orthopedic Surgery

## 2017-07-30 NOTE — Telephone Encounter (Signed)
Patient is requesting 2 refills:   Hydrocodone- Acetaminophen  7.5/325 mg  Qty  42 Tablets Take 1 tablet every 4 (four) hours as needed by mouth for moderate pain.  Methocarbamol (Robaxin)  500 mg  Qty 60 Tablets Take 1 tablet (500 mg total) by mouth every 6 (six) hours as needed for muscle spasms.

## 2017-07-31 ENCOUNTER — Other Ambulatory Visit: Payer: Self-pay | Admitting: Orthopedic Surgery

## 2017-07-31 ENCOUNTER — Ambulatory Visit (HOSPITAL_COMMUNITY): Payer: PPO

## 2017-07-31 DIAGNOSIS — M25662 Stiffness of left knee, not elsewhere classified: Secondary | ICD-10-CM | POA: Diagnosis not present

## 2017-07-31 DIAGNOSIS — M25562 Pain in left knee: Secondary | ICD-10-CM

## 2017-07-31 MED ORDER — METHOCARBAMOL 500 MG PO TABS: 500.0000 mg | ORAL_TABLET | Freq: Four times a day (QID) | ORAL | 1 refills | Status: DC | PRN

## 2017-07-31 MED ORDER — HYDROCODONE-ACETAMINOPHEN 5-325 MG PO TABS: 1.0000 | ORAL_TABLET | Freq: Four times a day (QID) | ORAL | 0 refills | Status: DC | PRN

## 2017-07-31 NOTE — Therapy (Signed)
[place], [place], [address] Phone: [phone]   Fax:  [phone]  Physical Therapy Treatment  Patient Details  Name: Christopher [name] MRN: [id number] Date of Birth: Jan 19, 1956 Referring Provider: [name]    Encounter Date: 07/31/2017  PT End of Session - 07/31/17 1414    Visit Number  9    Number of Visits  16    Date for PT Re-Evaluation  08/06/17    Authorization Type  [employer]     Authorization Time Period  07/07/17-09/06/17    Authorization - Visit Number  9    Authorization - Number of Visits  10    PT Start Time  3295    PT Stop Time  1427    PT Time Calculation (min)  30 min    Activity Tolerance  Patient tolerated treatment well;No increased pain    Behavior During Therapy  WFL for tasks assessed/performed       Past Medical History:  Diagnosis Date  . Anxiety   . Arthritis   . Asthma   . Complication of anesthesia    pt had a hard time being able to move after spinal anesthesia , 3-4 hours  . Depression   . Diabetes mellitus without complication ([place])    pt sts borderline  . GERD (gastroesophageal reflux disease)   . Headache(784.0)    after surgery  . Heart murmur    Years ago- not now  . Hyperlipidemia   . PONV (postoperative nausea and vomiting)   . Sleep apnea    uses CIPAP machine at night    Past Surgical History:  Procedure Laterality Date  . APPENDECTOMY    . BACK SURGERY     neck and back fusion  . BIOPSY  12/27/2015   Procedure: BIOPSY;  Surgeon: Rogene Houston, MD;  [facility];  Service: Endoscopy;;  Fundus biopsies and duodenal biopsies  . CARDIAC CATHETERIZATION    . CARDIAC CATHETERIZATION N/A 09/04/2016   Procedure: Right/Left Heart Cath and Coronary Angiography;  Surgeon: Peter M Martinique, MD;  Location: [facility];  Service: Cardiovascular;  Laterality: N/A;  . CHONDROPLASTY  08/15/2011   Procedure: CHONDROPLASTY;  Surgeon:  [name], MD;  [facility];  Service: Orthopedics;  Laterality: Left;  . COLONOSCOPY  06/27/2011   Procedure: COLONOSCOPY;  Surgeon: Rogene Houston, MD;  [facility];  Service: Endoscopy;  Laterality: N/A;  9:00 / Pt to be here at 9am for 10:45 procedure, benign polyps removed  . COLONOSCOPY N/A 10/05/2014   Procedure: COLONOSCOPY;  Surgeon: Rogene Houston, MD;  [facility];  Service: Endoscopy;  Laterality: N/A;  930  . ESOPHAGOGASTRODUODENOSCOPY N/A 12/27/2015   Procedure: ESOPHAGOGASTRODUODENOSCOPY (EGD);  Surgeon: Rogene Houston, MD;  [facility];  Service: Endoscopy;  Laterality: N/A;  3:00  . HERNIA REPAIR     umbilical hernia  . KNEE ARTHROSCOPY     left knee  . KNEE ARTHROSCOPY     right knee   . LUMBAR LAMINECTOMY/DECOMPRESSION MICRODISCECTOMY  09/14/2012   Procedure: LUMBAR LAMINECTOMY/DECOMPRESSION MICRODISCECTOMY 1 LEVEL;  Surgeon: Floyce Stakes, MD;  Location: [facility];  Service: Neurosurgery;  Laterality: Right;  Right Lumbar three-four Diskectomy  . neck fusion    . SHOULDER ARTHROSCOPY WITH BICEPSTENOTOMY Left 09/23/2013   Procedure: SHOULDER ARTHROSCOPY WITH BICEPSTENOTOMY AND EXTENSIVE DEBRIDEMENT;  Surgeon: Carole Civil, MD;  [facility];  Service: Orthopedics;  Laterality: Left;  . SHOULDER ARTHROSCOPY WITH ROTATOR CUFF REPAIR Left 05/05/2014   Procedure: SHOULDER ARTHROSCOPY LIMITED DEBRIDEMENT;  Surgeon: Carole Civil, MD;  [facility];  Service: Orthopedics;  Laterality: Left;  . SHOULDER OPEN ROTATOR CUFF REPAIR Left 05/05/2014   Procedure: ROTATOR CUFF REPAIR SHOULDER OPEN;  Surgeon: Carole Civil, MD;  [facility];  Service: Orthopedics;  Laterality: Left;  . SHOULDER SURGERY Right    Open Mumford procedure  . SPINAL FUSION     x 2  . TOTAL KNEE ARTHROPLASTY Left 06/16/2017   Procedure: LEFT TOTAL KNEE ARTHROPLASTY;  Surgeon: Carole Civil, MD;  [facility];  Service:  Orthopedics;  Laterality: Left;    There were no vitals filed for this visit.  Subjective Assessment - 07/31/17 1403    Subjective  Pt doping well, still having mechanical cracking and tightness, but getting arroudn well durign th eday. Walking at the store and working on [place].     Pertinent History  Prior to surgery, chronic knee pain that required drainage q 3 months, scope left knoee 6 years ago. Pt reports walking with limp, pain and weakness prior to surgery, but does not report and trouble with bednign or straightening.     Currently in Pain?  Yes    Pain Score  4     Pain Location  -- left lateral patella/distal quads    Pain Orientation  Left                      [facility] Adult PT Treatment/Exercise - 07/31/17 0001      Ambulation/Gait   Ambulation Distance (Feet)  675 Feet    Gait velocity  0.52m/s    Gait Comments  Lt [place] and cross-over after 344feet      Knee/Hip Exercises: Stretches   Active Hamstring Stretch  Left;30 seconds;2 reps    Active Hamstring Stretch Limitations  12" step    Knee: Self-Stretch to increase Flexion  Left;10 seconds;5 reps lunge on box    Other Knee/Hip Stretches  Prone Quads stretch with strap      Knee/Hip Exercises: Supine   Knee Extension Limitations  -- did not assess    Knee Flexion Limitations  118      Knee/Hip Exercises: Prone   Hamstring Curl  3 sets;10 reps    Hamstring Curl Limitations  4lb      Manual Therapy   Myofascial Release  Left vastus lateralis x 10 minutes               PT Short Term Goals - 07/07/17 1356      PT SHORT TERM GOAL #1   Title  After 4 weeks patient will demonstrate improve ROM in knee AEB P/ROM 13-120 degrees    Status  New      PT SHORT TERM GOAL #2   Title  After 4 weeks patient will demonstrate improved strength in bilat hip stabilizers AEB SLS hold time >15seconds bilat.      Status  New      PT SHORT TERM GOAL #3   Title  After 4 weeks patient will  demonstrate improved activity tolerance AEB performance of 974ft AMB @ 1.47m/s or greater.     Status  New        PT Long Term Goals - 07/07/17 1358      PT LONG TERM GOAL #1   Title  After 8 weeks patient  will demonstrate improve ROM in knee AEB P/ROM 8-124 degrees    Status  New      PT LONG TERM GOAL #2   Title  After 8 weeks patient will demonstrate improved strength in bilat hip stabilizers AEB SLS hold time >30 seconds bilat.      Status  New      PT LONG TERM GOAL #3   Title  After 8 weeks patient will demonstrate improved power output AEB 30s Sit to Stand test >15x hands free.     Status  New      PT LONG TERM GOAL #4   Title  After 8 weeks patient will demonstrate improved quadricepts strength AEB Left 1rep max > or = 90% that of right quads to improve performance of stairs.     Status  New      PT LONG TERM GOAL #5   Title  After 8 weeks patient will demonstrate ability to access the community AEB 6MWT>1650feet.     Status  New            Plan - 07/31/17 1423    Clinical Impression Statement  Pt progressing well, but still limited by mechanical clicking in patella area. Strength adn range improving. Pt with noted significant myofascial trigger points, taut bands, and sapsm in left vastus lateralis. Gait speed minimally improved ~10%. SLS balance improving. Flexion ROM progressing well.  Gait still limited with Left hip instability.     Rehab Potential  Good    Clinical Impairments Affecting Rehab Potential  Great difficulty with extension ROM thus far rather than flexion ROM.     PT Frequency  2x / week    PT Duration  8 weeks    PT Treatment/Interventions  Electrical Stimulation;Balance training;ADLs/Self Care Home Management;Gait training;Stair training;Functional mobility training;Therapeutic activities;Therapeutic exercise;Dry needling;Passive range of motion;Scar mobilization;Compression bandaging;Energy conservation;Manual techniques    PT Next Visit Plan   Reassessment: Glute med strength, MFR to left vastus lateralis, dry needling,     PT Home Exercise Plan  Heel slides, LAQ, marching in place (Passive flexion ROM), extension stretching, SLS hip abduction, Bridging     Consulted and Agree with Plan of Care  Patient       Patient will benefit from skilled therapeutic intervention in order to improve the following deficits and impairments:  Abnormal gait, Decreased endurance, Hypomobility, Increased edema, Decreased strength, Decreased activity tolerance, Decreased knowledge of use of DME, Decreased balance, Difficulty walking, Decreased mobility, Decreased range of motion, Improper body mechanics  Visit Diagnosis: Stiffness of left knee, not elsewhere classified  Acute pain of left knee     Problem List Patient Active Problem List   Diagnosis Date Noted  . S/P knee replacement left 06/16/17 06/16/2017  . Primary osteoarthritis of left knee   . Hyperlipidemia 09/04/2016  . Angina pectoris ([place]) 09/04/2016  . OSA (obstructive sleep apnea) 09/04/2016  . COPD (chronic obstructive pulmonary disease) ([place]) 09/04/2016  . Rotator cuff tear 05/05/2014  . S/P shoulder surgery 09/26/2013  . Arthritis, shoulder region 09/26/2013  . Bursitis, shoulder 09/26/2013  . Synovitis of shoulder 09/26/2013  . Labral tear of shoulder, degenerative 09/26/2013  . Biceps tendon tear 09/26/2013  . Rotator cuff syndrome of left shoulder 06/21/2013  . Arthritis 06/21/2013  . Patellar tendinitis 03/29/2013  . Effusion of knee joint 03/29/2013  . Bursitis/tendonitis, shoulder 03/10/2013  . Effusion of knee joint, left 08/18/2011  . Knee pain 08/18/2011  . Acute torn meniscus 07/30/2011  .  Old torn meniscus of knee 07/30/2011  . ARTHRITIS, LEFT KNEE 10/15/2010  . MEDIAL MENISCUS TEAR, RIGHT 10/15/2010  . HIP PAIN 01/15/2010  . DEGENERATIVE DISC DISEASE, LUMBOSACRAL SPINE W/RADICULOPATHY 01/15/2010  . PLICA SYNDROME 63/87/5643  . DERANGEMENT MENISCUS  07/09/2009  . JOINT EFFUSION, LEFT KNEE 07/09/2009  . KNEE, ARTHRITIS, DEGEN./OSTEO 01/30/2009  . KNEE PAIN 01/30/2009  . ANKLE SPRAIN, RIGHT 11/08/2007   2:31 PM, 07/31/17 Etta Grandchild, PT, DPT Physical Therapist at [facility] [phone] (office)      Etta Grandchild 07/31/2017, 2:30 PM  [facility] [address] [place], [place], [address] Phone: [phone]   Fax:  [phone]  Name: Christopher [name] MRN: [id number] Date of Birth: [birth year]

## 2017-07-31 NOTE — Telephone Encounter (Signed)
Pick up at the pharmacy

## 2017-08-03 ENCOUNTER — Ambulatory Visit (HOSPITAL_COMMUNITY): Payer: PPO | Admitting: Physical Therapy

## 2017-08-03 ENCOUNTER — Telehealth (HOSPITAL_COMMUNITY): Payer: Self-pay | Admitting: Pulmonary Disease

## 2017-08-03 NOTE — Telephone Encounter (Signed)
08/03/17  wife cx and we scheduled more appts for him up until he sees Dr. Aline Brochure on 12/11

## 2017-08-05 ENCOUNTER — Ambulatory Visit (HOSPITAL_COMMUNITY): Payer: PPO | Attending: Orthopedic Surgery | Admitting: Physical Therapy

## 2017-08-05 DIAGNOSIS — M25562 Pain in left knee: Secondary | ICD-10-CM | POA: Diagnosis not present

## 2017-08-05 DIAGNOSIS — M25662 Stiffness of left knee, not elsewhere classified: Secondary | ICD-10-CM | POA: Insufficient documentation

## 2017-08-05 NOTE — Therapy (Signed)
[place], [place], [address] Phone: [phone]   Fax:  [phone]  Physical Therapy Treatment  Patient Details  Name: Christopher [name] MRN: [id number] Date of Birth: Jan 16, 1956 Referring Provider: [name]    Encounter Date: 08/05/2017  PT End of Session - 08/05/17 1459    Visit Number  10    Number of Visits  16    Date for PT Re-Evaluation  08/06/17    Authorization Type  [employer] , gcodes done vist 10    Authorization Time Period  07/07/17-09/06/17    Authorization - Visit Number  1    Authorization - Number of Visits  10   PT Start Time  1400    PT Stop Time  1448    PT Time Calculation (min)  48 min    Activity Tolerance  Patient tolerated treatment well;No increased pain    Behavior During Therapy  WFL for tasks assessed/performed       Past Medical History:  Diagnosis Date  . Anxiety   . Arthritis   . Asthma   . Complication of anesthesia    pt had a hard time being able to move after spinal anesthesia , 3-4 hours  . Depression   . Diabetes mellitus without complication ([place])    pt sts borderline  . GERD (gastroesophageal reflux disease)   . Headache(784.0)    after surgery  . Heart murmur    Years ago- not now  . Hyperlipidemia   . PONV (postoperative nausea and vomiting)   . Sleep apnea    uses CIPAP machine at night    Past Surgical History:  Procedure Laterality Date  . APPENDECTOMY    . BACK SURGERY     neck and back fusion  . BIOPSY  12/27/2015   Procedure: BIOPSY;  Surgeon: Rogene Houston, MD;  [facility];  Service: Endoscopy;;  Fundus biopsies and duodenal biopsies  . CARDIAC CATHETERIZATION    . CARDIAC CATHETERIZATION N/A 09/04/2016   Procedure: Right/Left Heart Cath and Coronary Angiography;  Surgeon: Peter M Martinique, MD;  Location: [facility];  Service: Cardiovascular;  Laterality: N/A;  . CHONDROPLASTY  08/15/2011   Procedure:  CHONDROPLASTY;  Surgeon: [name], MD;  Location: [facility];  Service: Orthopedics;  Laterality: Left;  . COLONOSCOPY  06/27/2011   Procedure: COLONOSCOPY;  Surgeon: Rogene Houston, MD;  [facility];  Service: Endoscopy;  Laterality: N/A;  9:00 / Pt to be here at 9am for 10:45 procedure, benign polyps removed  . COLONOSCOPY N/A 10/05/2014   Procedure: COLONOSCOPY;  Surgeon: Rogene Houston, MD;  [facility];  Service: Endoscopy;  Laterality: N/A;  930  . ESOPHAGOGASTRODUODENOSCOPY N/A 12/27/2015   Procedure: ESOPHAGOGASTRODUODENOSCOPY (EGD);  Surgeon: Rogene Houston, MD;  [facility];  Service: Endoscopy;  Laterality: N/A;  3:00  . HERNIA REPAIR     umbilical hernia  . KNEE ARTHROSCOPY     left knee  . KNEE ARTHROSCOPY     right knee   . LUMBAR LAMINECTOMY/DECOMPRESSION MICRODISCECTOMY  09/14/2012   Procedure: LUMBAR LAMINECTOMY/DECOMPRESSION MICRODISCECTOMY 1 LEVEL;  Surgeon: Floyce Stakes, MD;  Location: [facility];  Service: Neurosurgery;  Laterality: Right;  Right Lumbar three-four Diskectomy  . neck fusion    . SHOULDER ARTHROSCOPY WITH BICEPSTENOTOMY Left 09/23/2013   Procedure: SHOULDER ARTHROSCOPY WITH BICEPSTENOTOMY AND EXTENSIVE DEBRIDEMENT;  Surgeon: Carole Civil, MD;  [facility]: [facility];  Service: Orthopedics;  Laterality: Left;  . SHOULDER ARTHROSCOPY WITH ROTATOR CUFF REPAIR Left 05/05/2014   Procedure: SHOULDER ARTHROSCOPY LIMITED DEBRIDEMENT;  Surgeon: Carole Civil, MD;  [facility]: [facility];  Service: Orthopedics;  Laterality: Left;  . SHOULDER OPEN ROTATOR CUFF REPAIR Left 05/05/2014   Procedure: ROTATOR CUFF REPAIR SHOULDER OPEN;  Surgeon: Carole Civil, MD;  Location: [facility];  Service: Orthopedics;  Laterality: Left;  . SHOULDER SURGERY Right    Open Mumford procedure  . SPINAL FUSION     x 2  . TOTAL KNEE ARTHROPLASTY Left 06/16/2017   Procedure: LEFT TOTAL KNEE ARTHROPLASTY;  Surgeon: Carole Civil, MD;   [facility]: [facility];  Service: Orthopedics;  Laterality: Left;    There were no vitals filed for this visit.  Subjective Assessment - 08/05/17 1452    Subjective  PT states he can tell he's improving overall.  STates he just got done walking around [place].  The only issue he is having is the swelling/tightness in his knee.     Currently in Pain?  Yes    Pain Score  3     Pain Location  Knee    Pain Orientation  Left    Pain Descriptors / Indicators  Aching    Pain Type  Surgical pain         [facility] PT Assessment - 08/05/17 0001      Observation/Other Assessments   Focus on Therapeutic Outcomes (FOTO)   Foto 49% (51% impaired)  was 64% impaired      Circumferential Edema   Circumferential - Right  14.25 joint line    Circumferential - Left   14.5 joint line was 15 inches      PROM   PROM Assessment Site  Knee    Right/Left Knee  Left    Left Knee Extension  8 was lacking 20 from neutral    Left Knee Flexion  118 was 116                  [facility] Adult PT Treatment/Exercise - 08/05/17 0001      Ambulation/Gait   Ambulation Distance (Feet)  678 Feet in 3 minutes    Gait velocity  1.14 m/sec      Knee/Hip Exercises: Stretches   Active Hamstring Stretch  Left;30 seconds;2 reps    Active Hamstring Stretch Limitations  12" step    Knee: Self-Stretch to increase Flexion  Left;10 seconds;5 reps    Gastroc Stretch  3 reps;30 seconds    Gastroc Stretch Limitations  slant board      Knee/Hip Exercises: Aerobic   Stationary Bike  x4 mins at beginning of session for ROM, seat 10, fwd and retro revolutions      Knee/Hip Exercises: Standing   Forward Lunges  Left;15 reps;Limitations    Forward Lunges Limitations  4" no UE's    Lateral Step Up  Left;15 reps;Hand Hold: 1;Step Height: 6"    Forward Step Up  Left;15 reps;Hand Hold: 2;Step Height: 6"    Step Down  Left;15 reps;Hand Hold: 2;Step Height: 6"      Knee/Hip Exercises: Supine   Knee Extension Limitations  8     Knee Flexion Limitations  118      Manual Therapy   Manual Therapy  Edema management    Manual therapy comments  completed seperate from all other interventions    Edema Management  Retro massage for edema control with LE elevated  Joint Mobilization  patella mobs sup/inf, anterior knee joint for extension ROM    Soft tissue mobilization  to improve ROM quad during retro massage and hamstring/gastroc during knee hang    Myofascial Release  Left vastus lateralis x 10 minutes               PT Short Term Goals - 08/05/17 1500      PT SHORT TERM GOAL #1   Title  After 4 weeks patient will demonstrate improve ROM in knee AEB P/ROM 13-120 degrees    Status  Partially Met 8-118 degrees      PT SHORT TERM GOAL #2   Title  After 4 weeks patient will demonstrate improved strength in bilat hip stabilizers AEB SLS hold time >15seconds bilat.      Status  Unable to assess      PT SHORT TERM GOAL #3   Title  After 4 weeks patient will demonstrate improved activity tolerance AEB performance of 936f AMB @ 1.069m or greater.     Status  Partially Met        PT Long Term Goals - 08/05/17 1501      PT LONG TERM GOAL #1   Title  After 8 weeks patient will demonstrate improve ROM in knee AEB P/ROM 8-124 degrees    Status  Partially Met 8-118      PT LONG TERM GOAL #2   Title  After 8 weeks patient will demonstrate improved strength in bilat hip stabilizers AEB SLS hold time >30 seconds bilat.      Status  Unable to assess      PT LONG TERM GOAL #3   Title  After 8 weeks patient will demonstrate improved power output AEB 30s Sit to Stand test >15x hands free.     Baseline  -- completed 5 STS in 11.83 sec    Status  Unable to assess      PT LONG TERM GOAL #4   Title  After 8 weeks patient will demonstrate improved quadricepts strength AEB Left 1rep max > or = 90% that of right quads to improve performance of stairs.     Status  Unable to assess      PT LONG TERM GOAL #5   Title   After 8 weeks patient will demonstrate ability to access the community AEB [id number].     Status  Unable to assess            Plan - 08/05/17 1504    Clinical Impression Statement  PT overall progressing well with limitations in fascia and edema preventing full ROM.   PROM of extension reveals close to full ROM, however unable to obtain independently.  Continued with functional strengthening for quad and hamstring to assist this.  Gait velocity has improved evidenced by 3MWT as well as 5STS speed.  All normal activities pt is no longer having difficulties with, however longer distance ambulation and hobbies are still unobtainable.     Rehab Potential  Good    Clinical Impairments Affecting Rehab Potential  Great difficulty with extension ROM thus far rather than flexion ROM.     PT Frequency  2x / week    PT Duration  8 weeks    PT Treatment/Interventions  Electrical Stimulation;Balance training;ADLs/Self Care Home Management;Gait training;Stair training;Functional mobility training;Therapeutic activities;Therapeutic exercise;Dry needling;Passive range of motion;Scar mobilization;Compression bandaging;Energy conservation;Manual techniques    PT Next Visit Plan  Continue to progress towards full ROM, quad strengthening, LE/hip stability.  Next session begin static balance, vector stance.    PT Home Exercise Plan  Heel slides, LAQ, marching in place (Passive flexion ROM), extension stretching, SLS hip abduction, Bridging     Consulted and Agree with Plan of Care  Patient       Patient will benefit from skilled therapeutic intervention in order to improve the following deficits and impairments:  Abnormal gait, Decreased endurance, Hypomobility, Increased edema, Decreased strength, Decreased activity tolerance, Decreased knowledge of use of DME, Decreased balance, Difficulty walking, Decreased mobility, Decreased range of motion, Improper body mechanics  Visit Diagnosis: Stiffness of left  knee, not elsewhere classified  Acute pain of left knee     Problem List Patient Active Problem List   Diagnosis Date Noted  . S/P knee replacement left 06/16/17 06/16/2017  . Primary osteoarthritis of left knee   . Hyperlipidemia 09/04/2016  . Angina pectoris ([place]) 09/04/2016  . OSA (obstructive sleep apnea) 09/04/2016  . COPD (chronic obstructive pulmonary disease) ([place]) 09/04/2016  . Rotator cuff tear 05/05/2014  . S/P shoulder surgery 09/26/2013  . Arthritis, shoulder region 09/26/2013  . Bursitis, shoulder 09/26/2013  . Synovitis of shoulder 09/26/2013  . Labral tear of shoulder, degenerative 09/26/2013  . Biceps tendon tear 09/26/2013  . Rotator cuff syndrome of left shoulder 06/21/2013  . Arthritis 06/21/2013  . Patellar tendinitis 03/29/2013  . Effusion of knee joint 03/29/2013  . Bursitis/tendonitis, shoulder 03/10/2013  . Effusion of knee joint, left 08/18/2011  . Knee pain 08/18/2011  . Acute torn meniscus 07/30/2011  . Old torn meniscus of knee 07/30/2011  . ARTHRITIS, LEFT KNEE 10/15/2010  . MEDIAL MENISCUS TEAR, RIGHT 10/15/2010  . HIP PAIN 01/15/2010  . DEGENERATIVE DISC DISEASE, LUMBOSACRAL SPINE W/RADICULOPATHY 01/15/2010  . PLICA SYNDROME 03/47/4259  . DERANGEMENT MENISCUS 07/09/2009  . JOINT EFFUSION, LEFT KNEE 07/09/2009  . KNEE, ARTHRITIS, DEGEN./OSTEO 01/30/2009  . KNEE PAIN 01/30/2009  . ANKLE SPRAIN, RIGHT 11/08/2007   Teena Irani, PTA/CLT [phone]  Teena Irani 08/05/2017, 3:10 PM  [facility] [address] [place], [place], [address] Phone: [phone]   Fax:  [phone]  Name: MONTRAVIOUS WEIGELT MRN: [id number] Date of Birth: 07-15-1956     02-Sep-2017 1616  PT G-Codes  Functional Assessment Tool Used (Outpatient Only) Clincal Judgment   Functional Limitation Mobility: Walking and moving around  Mobility: Walking and Moving Around Current Status [phone]) CJ  Mobility: Walking and  Moving Around Goal Status [phone]) CI    4:19 PM, 09-02-2017 Etta Grandchild, PT, DPT Physical Therapist at [facility] [phone] (office)

## 2017-08-07 ENCOUNTER — Ambulatory Visit (HOSPITAL_COMMUNITY): Payer: PPO

## 2017-08-07 ENCOUNTER — Encounter (HOSPITAL_COMMUNITY): Payer: Self-pay

## 2017-08-07 DIAGNOSIS — M25662 Stiffness of left knee, not elsewhere classified: Secondary | ICD-10-CM | POA: Diagnosis not present

## 2017-08-07 DIAGNOSIS — M25562 Pain in left knee: Secondary | ICD-10-CM

## 2017-08-07 NOTE — Therapy (Addendum)
[place], [place], [address] Phone: [phone]   Fax:  [phone]  Physical Therapy Treatment  Patient Details  Name: Christopher [name] MRN: [id number] Date of Birth: 19-Jul-1956 Referring Provider: [name]   Encounter Date: 08/07/2017  PT End of Session - 08/07/17 1448    Visit Number  11    Number of Visits  16    Date for PT Re-Evaluation  09/06/17    Authorization Type  [employer] , gcodes done vist 10    Authorization Time Period  07/07/17-09/06/17    Authorization - Visit Number  2    Authorization - Number of Visits  10    PT Start Time  9242    PT Stop Time  1520    PT Time Calculation (min)  42 min    Activity Tolerance  Patient tolerated treatment well;No increased pain    Behavior During Therapy  WFL for tasks assessed/performed       Past Medical History:  Diagnosis Date  . Anxiety   . Arthritis   . Asthma   . Complication of anesthesia    pt had a hard time being able to move after spinal anesthesia , 3-4 hours  . Depression   . Diabetes mellitus without complication ([facility])    pt sts borderline  . GERD (gastroesophageal reflux disease)   . Headache(784.0)    after surgery  . Heart murmur    Years ago- not now  . Hyperlipidemia   . PONV (postoperative nausea and vomiting)   . Sleep apnea    uses CIPAP machine at night    Past Surgical History:  Procedure Laterality Date  . APPENDECTOMY    . BACK SURGERY     neck and back fusion  . BIOPSY  12/27/2015   Procedure: BIOPSY;  Surgeon: Rogene Houston, MD;  [facility];  Service: Endoscopy;;  Fundus biopsies and duodenal biopsies  . CARDIAC CATHETERIZATION    . CARDIAC CATHETERIZATION N/A 09/04/2016   Procedure: Right/Left Heart Cath and Coronary Angiography;  Surgeon: Peter M Martinique, MD;  Location: [facility];  Service: Cardiovascular;  Laterality: N/A;  . CHONDROPLASTY  08/15/2011   Procedure:  CHONDROPLASTY;  Surgeon: [name], MD;  [facility];  Service: Orthopedics;  Laterality: Left;  . COLONOSCOPY  06/27/2011   Procedure: COLONOSCOPY;  Surgeon: Rogene Houston, MD;  [facility];  Service: Endoscopy;  Laterality: N/A;  9:00 / Pt to be here at 9am for 10:45 procedure, benign polyps removed  . COLONOSCOPY N/A 10/05/2014   Procedure: COLONOSCOPY;  Surgeon: Rogene Houston, MD;  [facility];  Service: Endoscopy;  Laterality: N/A;  930  . ESOPHAGOGASTRODUODENOSCOPY N/A 12/27/2015   Procedure: ESOPHAGOGASTRODUODENOSCOPY (EGD);  Surgeon: Rogene Houston, MD;  [facility];  Service: Endoscopy;  Laterality: N/A;  3:00  . HERNIA REPAIR     umbilical hernia  . KNEE ARTHROSCOPY     left knee  . KNEE ARTHROSCOPY     right knee   . LUMBAR LAMINECTOMY/DECOMPRESSION MICRODISCECTOMY  09/14/2012   Procedure: LUMBAR LAMINECTOMY/DECOMPRESSION MICRODISCECTOMY 1 LEVEL;  Surgeon: Floyce Stakes, MD;  Location: [facility];  Service: Neurosurgery;  Laterality: Right;  Right Lumbar three-four Diskectomy  . neck fusion    . SHOULDER ARTHROSCOPY WITH BICEPSTENOTOMY Left 09/23/2013   Procedure: SHOULDER ARTHROSCOPY WITH BICEPSTENOTOMY AND EXTENSIVE DEBRIDEMENT;  Surgeon: Carole Civil, MD;  [facility];  Service: Orthopedics;  Laterality: Left;  . SHOULDER ARTHROSCOPY WITH ROTATOR CUFF REPAIR Left 05/05/2014   Procedure: SHOULDER ARTHROSCOPY LIMITED DEBRIDEMENT;  Surgeon: Carole Civil, MD;  Location[facility];  Service: Orthopedics;  Laterality: Left;  . SHOULDER OPEN ROTATOR CUFF REPAIR Left 05/05/2014   Procedure: ROTATOR CUFF REPAIR SHOULDER OPEN;  Surgeon: Carole Civil, MD;  [facility];  Service: Orthopedics;  Laterality: Left;  . SHOULDER SURGERY Right    Open Mumford procedure  . SPINAL FUSION     x 2  . TOTAL KNEE ARTHROPLASTY Left 06/16/2017   Procedure: LEFT TOTAL KNEE ARTHROPLASTY;  Surgeon: Carole Civil, MD;   [facility];  Service: Orthopedics;  Laterality: Left;    There were no vitals filed for this visit.  Subjective Assessment - 08/07/17 1438    Subjective  Pt stated he is doing good today.  Reports overall function is going great.  Does continues to c/o fatigue with walking greater than 30 minutes and stiffness/tightness in his knee.  Most difficulty with extending knee.      Pertinent History  Prior to surgery, chronic knee pain that required drainage q 3 months, scope left knoee 6 years ago. Pt reports walking with limp, pain and weakness prior to surgery, but does not report and trouble with bednign or straightening.     Patient Stated Goals  improve ability to perform stairs up/down.     Currently in Pain?  No/denies         [facility] PT Assessment - 08/07/17 0001      Assessment   Medical Diagnosis  s/p Left TKA     Referring Provider  [name]    Onset Date/Surgical Date  06/16/17    Hand Dominance  Right    Next MD Visit  08/11/17    Prior Therapy  HHPT, now discharged      Precautions   Precautions  None      Observation/Other Assessments-Edema    Edema  Circumferential ankle 10.5in, calf 14in, midthigh 18in, shoe 8      Circumferential Edema   Circumferential - Right  14.25 joint line    Circumferential - Left   14.5 joint line      PROM   PROM Assessment Site  Knee    Right/Left Knee  Left    Left Knee Extension  6    Left Knee Flexion  120                  [facility] Adult PT Treatment/Exercise - 08/07/17 0001      Knee/Hip Exercises: Stretches   Active Hamstring Stretch  Left;30 seconds;2 reps    Active Hamstring Stretch Limitations  supine    Gastroc Stretch  3 reps;30 seconds    Gastroc Stretch Limitations  slant board      Knee/Hip Exercises: Aerobic   Stationary Bike  x4 mins at beginning of session for ROM, seat 7 full revolution      Knee/Hip Exercises: Standing   SLS  Lt 47" max of 5    SLS with Vectors  5x 5" hold Lt LE no HHA     Other Standing Knee Exercises  tandem stance on foam 2x 30"      Knee/Hip Exercises: Supine   Short Arc [employer]  Left;20 reps    Short Arc Quad Sets Limitations  4# 3-5" hold    Knee Extension  AROM    Knee Extension  Limitations  6    Knee Flexion Limitations  120      Manual Therapy   Manual Therapy  Edema management;Joint mobilization    Manual therapy comments  completed seperate from all other interventions    Edema Management  Retro massage for edema control with LE elevated    Joint Mobilization  patella mobs sup/inf, anterior knee joint for extension ROM    Soft tissue mobilization  to improve ROM quad during retro massage and hamstring/gastroc during knee hang               PT Short Term Goals - 08/05/17 1500      PT SHORT TERM GOAL #1   Title  After 4 weeks patient will demonstrate improve ROM in knee AEB P/ROM 13-120 degrees    Status  Partially Met 8-118 degrees      PT SHORT TERM GOAL #2   Title  After 4 weeks patient will demonstrate improved strength in bilat hip stabilizers AEB SLS hold time >15seconds bilat.      Status  Unable to assess      PT SHORT TERM GOAL #3   Title  After 4 weeks patient will demonstrate improved activity tolerance AEB performance of 930f AMB @ 1.018m or greater.     Status  Partially Met        PT Long Term Goals - 08/05/17 1501      PT LONG TERM GOAL #1   Title  After 8 weeks patient will demonstrate improve ROM in knee AEB P/ROM 8-124 degrees    Status  Partially Met 8-118      PT LONG TERM GOAL #2   Title  After 8 weeks patient will demonstrate improved strength in bilat hip stabilizers AEB SLS hold time >30 seconds bilat.      Status  Unable to assess      PT LONG TERM GOAL #3   Title  After 8 weeks patient will demonstrate improved power output AEB 30s Sit to Stand test >15x hands free.     Baseline  -- completed 5 STS in 11.83 sec    Status  Unable to assess      PT LONG TERM GOAL #4   Title  After 8  weeks patient will demonstrate improved quadricepts strength AEB Left 1rep max > or = 90% that of right quads to improve performance of stairs.     Status  Unable to assess      PT LONG TERM GOAL #5   Title  After 8 weeks patient will demonstrate ability to access the community AEB [id number].     Status  Unable to assess            Plan - 08/07/17 1526    Clinical Impression Statement  Pt reports vast improvements with functional abilities, biggest c/o today with edema and knee extension as well as activity tolerance with gait.  Began session wiht manual retro massage and joint mobs to reduced edema proximal knee to assist with knee mobitliy.  Pt educated on compression hose and referral form faxed to MD as well as measurements taken and [place] with quad strengthening to improve extension and additional balance activities for knee stability.  Added dynamic surface and vector stance for hip and knee stability.  AROM improving with 6-120 at EOS.  No reports of pain.      Rehab Potential  Good  Clinical Impairments Affecting Rehab Potential  Great difficulty with extension ROM thus far rather than flexion ROM.     PT Frequency  2x / week    PT Duration  8 weeks    PT Treatment/Interventions  Electrical Stimulation;Balance training;ADLs/Self Care Home Management;Gait training;Stair training;Functional mobility training;Therapeutic activities;Therapeutic exercise;Dry needling;Passive range of motion;Scar mobilization;Compression bandaging;Energy conservation;Manual techniques    PT Next Visit Plan  F/U with MD apt on 12/11 and compression hose referral.  Continue to progress towards full ROM, quad strengthening, LE/hip stability and dynamic balance activities.    PT Home Exercise Plan  Heel slides, LAQ, marching in place (Passive flexion ROM), extension stretching, SLS hip abduction, Bridging     Recommended Other  Services  Thigh high compression hose 20-30 for Lt knee edema control.         Patient will benefit from skilled therapeutic intervention in order to improve the following deficits and impairments:  Abnormal gait, Decreased endurance, Hypomobility, Increased edema, Decreased strength, Decreased activity tolerance, Decreased knowledge of use of DME, Decreased balance, Difficulty walking, Decreased mobility, Decreased range of motion, Improper body mechanics  Visit Diagnosis: Stiffness of left knee, not elsewhere classified  Acute pain of left knee     Problem List Patient Active Problem List   Diagnosis Date Noted  . S/P knee replacement left 06/16/17 06/16/2017  . Primary osteoarthritis of left knee   . Hyperlipidemia 09/04/2016  . Angina pectoris ([place]) 09/04/2016  . OSA (obstructive sleep apnea) 09/04/2016  . COPD (chronic obstructive pulmonary disease) ([place]) 09/04/2016  . Rotator cuff tear 05/05/2014  . S/P shoulder surgery 09/26/2013  . Arthritis, shoulder region 09/26/2013  . Bursitis, shoulder 09/26/2013  . Synovitis of shoulder 09/26/2013  . Labral tear of shoulder, degenerative 09/26/2013  . Biceps tendon tear 09/26/2013  . Rotator cuff syndrome of left shoulder 06/21/2013  . Arthritis 06/21/2013  . Patellar tendinitis 03/29/2013  . Effusion of knee joint 03/29/2013  . Bursitis/tendonitis, shoulder 03/10/2013  . Effusion of knee joint, left 08/18/2011  . Knee pain 08/18/2011  . Acute torn meniscus 07/30/2011  . Old torn meniscus of knee 07/30/2011  . ARTHRITIS, LEFT KNEE 10/15/2010  . MEDIAL MENISCUS TEAR, RIGHT 10/15/2010  . HIP PAIN 01/15/2010  . DEGENERATIVE DISC DISEASE, LUMBOSACRAL SPINE W/RADICULOPATHY 01/15/2010  . PLICA SYNDROME 72/53/6644  . DERANGEMENT MENISCUS 07/09/2009  . JOINT EFFUSION, LEFT KNEE 07/09/2009  . KNEE, ARTHRITIS, DEGEN./OSTEO 01/30/2009  . KNEE PAIN 01/30/2009  . ANKLE SPRAIN, RIGHT 11/08/2007   Ihor Austin, LPTA;  CBIS [phone]  Aldona Lento 08/07/2017, 3:35 PM  [place], [place], [address] Phone: [phone]   Fax:  [phone]  Name: Christopher [name] MRN: [id number] Date of Birth: 1955-09-24   PHYSICAL THERAPY DISCHARGE SUMMARY  Visits from Start of Care: 11  Current functional level related to goals / functional outcomes: Pt continues to demonstrate mild impairment in mobility, ROM, and functional strength. Pt expresses desire to cease PT services at this time.    Remaining deficits: *detailed in previous note (reassessment visit)   Education / Equipment: N/A  Plan: Patient agrees to discharge.  Patient goals were partially met. Patient is being discharged due to not returning since the last visit.  ?????         2:45 PM, 09/08/17 Etta Grandchild, PT, DPT Physical Therapist at [facility] [phone] (office)

## 2017-08-10 ENCOUNTER — Ambulatory Visit (HOSPITAL_COMMUNITY): Payer: PPO | Admitting: Physical Therapy

## 2017-08-11 ENCOUNTER — Telehealth (HOSPITAL_COMMUNITY): Payer: Self-pay | Admitting: Pulmonary Disease

## 2017-08-11 ENCOUNTER — Encounter: Payer: Self-pay | Admitting: Orthopedic Surgery

## 2017-08-11 ENCOUNTER — Ambulatory Visit (INDEPENDENT_AMBULATORY_CARE_PROVIDER_SITE_OTHER): Payer: PPO | Admitting: Orthopedic Surgery

## 2017-08-11 VITALS — BP 110/63 | HR 70 | Ht 66.0 in | Wt 213.0 lb

## 2017-08-11 DIAGNOSIS — Z96652 Presence of left artificial knee joint: Secondary | ICD-10-CM

## 2017-08-11 MED ORDER — METHOCARBAMOL 500 MG PO TABS: 500.0000 mg | ORAL_TABLET | Freq: Four times a day (QID) | ORAL | 1 refills | Status: DC | PRN

## 2017-08-11 NOTE — Progress Notes (Signed)
FOLLOW UP   Chief Complaint  Patient presents with  . Routine Post Op    left TKR 06/16/17 feels great     POD 56  NO COMPLAINTS   BP 110/63   Pulse 70   Ht 5\' 6"  (1.676 m)   Wt 213 lb (96.6 kg)   BMI 34.38 kg/m    Physical Exam  Constitutional: He appears well-developed and well-nourished.  Musculoskeletal:       Left knee: He exhibits normal range of motion, no swelling, no effusion, no ecchymosis, no deformity, no erythema, no LCL laxity, no bony tenderness and no MCL laxity. No tenderness found.  Vitals reviewed.    Current Outpatient Medications:  .  ALPRAZolam (XANAX) 0.5 MG tablet, Take 0.5 mg by mouth at bedtime as needed for anxiety or sleep. , Disp: , Rfl:  .  apixaban (ELIQUIS) 2.5 MG TABS tablet, Take 1 tablet (2.5 mg total) by mouth every 12 (twelve) hours., Disp: 56 tablet, Rfl: 0 .  atorvastatin (LIPITOR) 20 MG tablet, Take 20 mg by mouth every morning. , Disp: , Rfl:  .  dicyclomine (BENTYL) 10 MG capsule, TAKE 1 CAPSULE THREE TIMES DAILY BETWEEN MEALS, Disp: 90 capsule, Rfl: 5 .  HYDROcodone-acetaminophen (NORCO) 5-325 MG tablet, Take 1 tablet by mouth every 6 (six) hours as needed for moderate pain., Disp: 30 tablet, Rfl: 0 .  ibuprofen (ADVIL,MOTRIN) 800 MG tablet, Take 1 tablet (800 mg total) by mouth every 8 (eight) hours as needed. (Patient taking differently: Take 800 mg by mouth every 8 (eight) hours as needed for headache or moderate pain. ), Disp: 90 tablet, Rfl: 0 .  Menthol, Topical Analgesic, (ARCTIC RELIEF EX), Apply 1 application topically as needed (for pain)., Disp: , Rfl:  .  methocarbamol (ROBAXIN) 500 MG tablet, Take 1 tablet (500 mg total) by mouth every 6 (six) hours as needed for muscle spasms., Disp: 60 tablet, Rfl: 1 .  nitroGLYCERIN (NITROSTAT) 0.4 MG SL tablet, Place 1 tablet (0.4 mg total) under the tongue every 5 (five) minutes as needed. (Patient taking differently: Place 0.4 mg under the tongue every 5 (five) minutes as needed for  chest pain. ), Disp: 25 tablet, Rfl: 3 .  pantoprazole (PROTONIX) 40 MG tablet, Take 40 mg by mouth daily. , Disp: , Rfl:  .  PROAIR HFA 108 (90 Base) MCG/ACT inhaler, Inhale 2 puffs into the lungs every 6 (six) hours as needed for wheezing or shortness of breath. , Disp: , Rfl:  .  sertraline (ZOLOFT) 50 MG tablet, Take 50 mg by mouth daily. , Disp: , Rfl:  .  temazepam (RESTORIL) 30 MG capsule, Take 30 mg by mouth at bedtime. , Disp: , Rfl:    RETURN 10 MONTHSS ANNUAL TKA XRAYS

## 2017-08-11 NOTE — Telephone Encounter (Signed)
08/11/17 wife left a message that patient saw Dr. Aline Brochure today and he said he was good to go and doesn't need to come back for therapy

## 2017-08-11 NOTE — Telephone Encounter (Signed)
08/11/17  I left a message to see if he wanted to reschedule the 12/10 appt

## 2017-08-12 DIAGNOSIS — M25561 Pain in right knee: Secondary | ICD-10-CM | POA: Diagnosis not present

## 2017-08-12 DIAGNOSIS — J449 Chronic obstructive pulmonary disease, unspecified: Secondary | ICD-10-CM | POA: Diagnosis not present

## 2017-08-12 DIAGNOSIS — E785 Hyperlipidemia, unspecified: Secondary | ICD-10-CM | POA: Diagnosis not present

## 2017-08-12 DIAGNOSIS — F17200 Nicotine dependence, unspecified, uncomplicated: Secondary | ICD-10-CM | POA: Diagnosis not present

## 2017-08-12 NOTE — Telephone Encounter (Signed)
Called and left message concerning signed order for compression hose.  Included contact number for pt to call back and possibly pick up faxed paper/or to mail.    [address], [place]; [facility] [phone]

## 2017-08-18 ENCOUNTER — Telehealth (HOSPITAL_COMMUNITY): Payer: Self-pay

## 2017-08-18 NOTE — Telephone Encounter (Signed)
Pt wants to be D/C from PT.

## 2017-08-18 NOTE — Telephone Encounter (Signed)
[name] came by and picked up garment order. NF 08/18/17

## 2017-09-09 ENCOUNTER — Encounter: Payer: Self-pay | Admitting: Acute Care

## 2017-09-23 ENCOUNTER — Telehealth: Payer: Self-pay | Admitting: Orthopedic Surgery

## 2017-09-23 NOTE — Telephone Encounter (Signed)
Patient called to inquire about whether it is 'normal to still have aching in the knee from knee replacement surgery, 06/16/17' - states it wakes him up at night; patient's wife, Zelphia Cairo, is designated contact at ph# [phone]

## 2017-09-24 NOTE — Telephone Encounter (Signed)
Yes, he can expect some continued pain, but I want him to come in to discuss with Dr Aline Brochure, for exam to check his ROM and to check his back.

## 2017-09-24 NOTE — Telephone Encounter (Signed)
Patient had missed Amy's call back. Per Amy, relayed to patient as noted.  Appointment scheduled; patient aware.

## 2017-09-24 NOTE — Telephone Encounter (Signed)
To you FYI , he is having pain at night  S/p knee replacement , I have advised sounds like he needs to come in to have his ROM checked and to see if any of it may be lumbar  He has made appt.

## 2017-10-01 ENCOUNTER — Telehealth: Payer: Self-pay | Admitting: Orthopedic Surgery

## 2017-10-01 NOTE — Telephone Encounter (Signed)
Patient's wife called and stated to cancel the appointment for tomorrow.  He is feeling much better.

## 2017-10-02 ENCOUNTER — Ambulatory Visit: Payer: Self-pay | Admitting: Orthopedic Surgery

## 2017-10-05 DIAGNOSIS — H2513 Age-related nuclear cataract, bilateral: Secondary | ICD-10-CM | POA: Diagnosis not present

## 2017-10-05 DIAGNOSIS — H0289 Other specified disorders of eyelid: Secondary | ICD-10-CM | POA: Diagnosis not present

## 2017-10-05 DIAGNOSIS — D3131 Benign neoplasm of right choroid: Secondary | ICD-10-CM | POA: Diagnosis not present

## 2017-10-05 DIAGNOSIS — H5202 Hypermetropia, left eye: Secondary | ICD-10-CM | POA: Diagnosis not present

## 2017-10-05 DIAGNOSIS — H52223 Regular astigmatism, bilateral: Secondary | ICD-10-CM | POA: Diagnosis not present

## 2017-10-05 DIAGNOSIS — H35363 Drusen (degenerative) of macula, bilateral: Secondary | ICD-10-CM | POA: Diagnosis not present

## 2017-10-05 DIAGNOSIS — H11153 Pinguecula, bilateral: Secondary | ICD-10-CM | POA: Diagnosis not present

## 2017-10-05 DIAGNOSIS — H524 Presbyopia: Secondary | ICD-10-CM | POA: Diagnosis not present

## 2017-10-05 DIAGNOSIS — H18413 Arcus senilis, bilateral: Secondary | ICD-10-CM | POA: Diagnosis not present

## 2017-10-27 ENCOUNTER — Encounter (INDEPENDENT_AMBULATORY_CARE_PROVIDER_SITE_OTHER): Payer: Self-pay | Admitting: *Deleted

## 2017-11-10 DIAGNOSIS — J449 Chronic obstructive pulmonary disease, unspecified: Secondary | ICD-10-CM | POA: Diagnosis not present

## 2017-11-10 DIAGNOSIS — N401 Enlarged prostate with lower urinary tract symptoms: Secondary | ICD-10-CM | POA: Diagnosis not present

## 2017-11-10 DIAGNOSIS — Z79891 Long term (current) use of opiate analgesic: Secondary | ICD-10-CM | POA: Diagnosis not present

## 2017-11-10 DIAGNOSIS — F419 Anxiety disorder, unspecified: Secondary | ICD-10-CM | POA: Diagnosis not present

## 2017-11-10 DIAGNOSIS — I10 Essential (primary) hypertension: Secondary | ICD-10-CM | POA: Diagnosis not present

## 2017-11-26 ENCOUNTER — Other Ambulatory Visit (INDEPENDENT_AMBULATORY_CARE_PROVIDER_SITE_OTHER): Payer: Self-pay | Admitting: *Deleted

## 2017-11-26 DIAGNOSIS — Z860100 Personal history of colon polyps, unspecified: Secondary | ICD-10-CM

## 2017-12-01 ENCOUNTER — Other Ambulatory Visit: Payer: Self-pay | Admitting: Radiology

## 2017-12-01 DIAGNOSIS — Z96652 Presence of left artificial knee joint: Secondary | ICD-10-CM

## 2017-12-01 MED ORDER — METHOCARBAMOL 500 MG PO TABS: 500.0000 mg | ORAL_TABLET | Freq: Four times a day (QID) | ORAL | 1 refills | Status: DC | PRN

## 2017-12-01 NOTE — Telephone Encounter (Signed)
Refill request received via fax for Robaxin from [facility]

## 2017-12-29 ENCOUNTER — Encounter (INDEPENDENT_AMBULATORY_CARE_PROVIDER_SITE_OTHER): Payer: Self-pay | Admitting: *Deleted

## 2017-12-29 ENCOUNTER — Telehealth (INDEPENDENT_AMBULATORY_CARE_PROVIDER_SITE_OTHER): Payer: Self-pay | Admitting: *Deleted

## 2017-12-29 MED ORDER — PEG 3350-KCL-NA BICARB-NACL 420 G PO SOLR: 4000.0000 mL | Freq: Once | ORAL | 0 refills | Status: AC

## 2017-12-29 NOTE — Telephone Encounter (Signed)
Patient needs trilyte 

## 2018-01-12 ENCOUNTER — Telehealth (INDEPENDENT_AMBULATORY_CARE_PROVIDER_SITE_OTHER): Payer: Self-pay | Admitting: *Deleted

## 2018-01-12 NOTE — Telephone Encounter (Signed)
Referring MD/PCP: hawkins   Procedure: tcs  Reason/Indication:  Hx polyps  Has patient had this procedure before?  Yes, 10/2014  If so, when, by whom and where?    Is there a family history of colon cancer?  no  Who?  What age when diagnosed?    Is patient diabetic?   no      Does patient have prosthetic heart valve or mechanical valve?  no  Do you have a pacemaker?  no  Has patient ever had endocarditis? no  Has patient had joint replacement within last 12 months?  no  Is patient constipated or do they take laxatives? no  Does patient have a history of alcohol/drug use?  no  Is patient on blood thinner such as Coumadin, Plavix and/or Aspirin? no  Medications: nitro prn, bentyl 10 mg every other day, pantoprazole 40 mg daily, sertraline 50 mg daily, alprazolam 0.5 prn, restoril 15 mg 2 at bedtime  Allergies: see epic  Medication Adjustment per Dr Lindi Adie, NP:   Procedure date & time: 02/11/18 at 830

## 2018-01-13 NOTE — Telephone Encounter (Signed)
agree

## 2018-01-20 ENCOUNTER — Encounter (INDEPENDENT_AMBULATORY_CARE_PROVIDER_SITE_OTHER): Payer: Self-pay | Admitting: *Deleted

## 2018-02-09 ENCOUNTER — Encounter: Payer: Self-pay | Admitting: Orthopedic Surgery

## 2018-02-09 ENCOUNTER — Ambulatory Visit: Payer: PPO | Admitting: Orthopedic Surgery

## 2018-02-09 VITALS — BP 124/76 | HR 59 | Ht 66.0 in | Wt 215.0 lb

## 2018-02-09 DIAGNOSIS — M7551 Bursitis of right shoulder: Secondary | ICD-10-CM | POA: Diagnosis not present

## 2018-02-09 DIAGNOSIS — M7552 Bursitis of left shoulder: Secondary | ICD-10-CM | POA: Diagnosis not present

## 2018-02-09 NOTE — Progress Notes (Signed)
Christopher [name]  02/09/2018  HISTORY SECTION :  Chief Complaint  Patient presents with  . Shoulder Pain    bilateral   62 year old male presents with bilateral shoulder pain he had surgery on his left shoulder.  He has more pain in the left shoulder than the right he has painful forward elevation he has night pain he has no weakness he has no history of recent trauma  Its a dull ache gets worse with activities relieved by rest although worse at night  Duration several months   Review of Systems  Constitutional: Negative for chills and fever.  Musculoskeletal: Positive for back pain. Negative for neck pain.  Neurological: Negative for tingling.    Past Medical History:  Diagnosis Date  . Anxiety   . Arthritis   . Asthma   . Complication of anesthesia    pt had a hard time being able to move after spinal anesthesia , 3-4 hours  . Depression   . Diabetes mellitus without complication ([place])    pt sts borderline  . GERD (gastroesophageal reflux disease)   . Headache(784.0)    after surgery  . Heart murmur    Years ago- not now  . Hyperlipidemia   . PONV (postoperative nausea and vomiting)   . Sleep apnea    uses CIPAP machine at night    Allergies  Allergen Reactions  . Celebrex [Celecoxib] Itching, Swelling and Other (See Comments)    All over  . Codeine Nausea And Vomiting and Other (See Comments)    Extreme stomach pain. This includes anything with the derivative of codeine in it.  . Doxycycline Swelling and Other (See Comments)    Made tongue turn black   . Cortisone Swelling  . Oxycodone-Acetaminophen Itching and Other (See Comments)    Can not  tolerate with benadryl   . Prednisone Swelling  . Relafen [Nabumetone] Swelling     Current Outpatient Medications:  .  ALPRAZolam (XANAX) 0.5 MG tablet, Take 0.5 mg by mouth 3 (three) times daily as needed for anxiety or sleep. , Disp: , Rfl:  .  dicyclomine (BENTYL) 10 MG capsule, TAKE 1 CAPSULE THREE TIMES  DAILY BETWEEN MEALS, Disp: 90 capsule, Rfl: 5 .  HYDROcodone-acetaminophen (NORCO) 5-325 MG tablet, Take 1 tablet by mouth every 6 (six) hours as needed for moderate pain. (Patient taking differently: Take 1 tablet by mouth 3 (three) times daily as needed for moderate pain. ), Disp: 30 tablet, Rfl: 0 .  ibuprofen (ADVIL,MOTRIN) 800 MG tablet, Take 1 tablet (800 mg total) by mouth every 8 (eight) hours as needed. (Patient taking differently: Take 800 mg by mouth every 8 (eight) hours as needed for headache or moderate pain. ), Disp: 90 tablet, Rfl: 0 .  Menthol, Topical Analgesic, (ARCTIC RELIEF EX), Apply 1 application topically as needed (for pain)., Disp: , Rfl:  .  methocarbamol (ROBAXIN) 500 MG tablet, Take 1 tablet (500 mg total) by mouth every 6 (six) hours as needed for muscle spasms., Disp: 60 tablet, Rfl: 1 .  nitroGLYCERIN (NITROSTAT) 0.4 MG SL tablet, Place 1 tablet (0.4 mg total) under the tongue every 5 (five) minutes as needed. (Patient taking differently: Place 0.4 mg under the tongue every 5 (five) minutes as needed for chest pain. ), Disp: 25 tablet, Rfl: 3 .  pantoprazole (PROTONIX) 40 MG tablet, Take 40 mg by mouth daily. , Disp: , Rfl:  .  PROAIR HFA 108 (90 Base) MCG/ACT inhaler, Inhale 3 puffs into the lungs  every 6 (six) hours as needed for wheezing or shortness of breath. , Disp: , Rfl:  .  sertraline (ZOLOFT) 50 MG tablet, Take 50 mg by mouth daily. , Disp: , Rfl:  .  temazepam (RESTORIL) 30 MG capsule, Take 30 mg by mouth at bedtime. , Disp: , Rfl:  .  apixaban (ELIQUIS) 2.5 MG TABS tablet, Take 1 tablet (2.5 mg total) by mouth every 12 (twelve) hours. (Patient not taking: Reported on 02/09/2018), Disp: 56 tablet, Rfl: 0   PHYSICAL EXAM SECTION: BP 124/76   Pulse (!) 59   Ht 5\' 6"  (1.676 m)   Wt 215 lb (97.5 kg)   BMI 34.70 kg/m   General appearance the patient is normally developed grooming and hygiene are normal  Oriented x3  Mood pleasant affect normal  Gait no  disturbances  Extremity #1 left shoulder Inspection no swelling or tenderness Range of motion full but painful  Stability tests were normal Strength is 5/5 Skin was warm dry and intact without rash lesion or ulceration Pulse and perfusion normal without edema Normal sensation Impingement positive  Extremity #2 right shoulder Inspection no swelling or tenderness Range of motion full Stability tests were normal Strength 5 out of 5 to manual muscle test Skin warm dry and intact without rash lesions or ulceration Impingement positive  MEDICAL DECISION SECTION:  Encounter Diagnosis  Name Primary?  . Bursitis of both shoulders Yes    Imaging NO  Plan:  (Rx., Inj., surg., Frx, MRI/CT, XR:2) INJECTIONS Recheck 6 weeks  Procedure note the subacromial injection shoulder left   Verbal consent was obtained to inject the  Left   Shoulder  Timeout was completed to confirm the injection site is a subacromial space of the  left  shoulder  Medication used Depo-Medrol 40 mg and lidocaine 1% 3 cc  Anesthesia was provided by ethyl chloride  The injection was performed in the left  posterior subacromial space. After pinning the skin with alcohol and anesthetized the skin with ethyl chloride the subacromial space was injected using a 20-gauge needle. There were no complications  Sterile dressing was applied.   Procedure note the subacromial injection shoulder RIGHT  Verbal consent was obtained to inject the  RIGHT   Shoulder  Timeout was completed to confirm the injection site is a subacromial space of the  RIGHT  shoulder   Medication used Depo-Medrol 40 mg and lidocaine 1% 3 cc  Anesthesia was provided by ethyl chloride  The injection was performed in the RIGHT  posterior subacromial space. After pinning the skin with alcohol and anesthetized the skin with ethyl chloride the subacromial space was injected using a 20-gauge needle. There were no complications  Sterile dressing  was applied.

## 2018-02-09 NOTE — Patient Instructions (Signed)

## 2018-02-11 ENCOUNTER — Ambulatory Visit (HOSPITAL_COMMUNITY)
Admission: RE | Admit: 2018-02-11 | Discharge: 2018-02-11 | Disposition: A | Discharge status: Home Health | Payer: PPO | Source: Ambulatory Visit | Attending: Internal Medicine | Admitting: Internal Medicine

## 2018-02-11 ENCOUNTER — Encounter (HOSPITAL_COMMUNITY): Payer: Self-pay

## 2018-02-11 ENCOUNTER — Encounter (HOSPITAL_COMMUNITY)
Admission: RE | Disposition: A | Discharge status: Home Health | Payer: Self-pay | Source: Ambulatory Visit | Attending: Internal Medicine

## 2018-02-11 DIAGNOSIS — Z881 Allergy status to other antibiotic agents status: Secondary | ICD-10-CM | POA: Diagnosis not present

## 2018-02-11 DIAGNOSIS — J45909 Unspecified asthma, uncomplicated: Secondary | ICD-10-CM | POA: Insufficient documentation

## 2018-02-11 DIAGNOSIS — F17210 Nicotine dependence, cigarettes, uncomplicated: Secondary | ICD-10-CM | POA: Insufficient documentation

## 2018-02-11 DIAGNOSIS — D123 Benign neoplasm of transverse colon: Secondary | ICD-10-CM | POA: Insufficient documentation

## 2018-02-11 DIAGNOSIS — F419 Anxiety disorder, unspecified: Secondary | ICD-10-CM | POA: Diagnosis not present

## 2018-02-11 DIAGNOSIS — Z886 Allergy status to analgesic agent status: Secondary | ICD-10-CM | POA: Diagnosis not present

## 2018-02-11 DIAGNOSIS — Z7901 Long term (current) use of anticoagulants: Secondary | ICD-10-CM | POA: Diagnosis not present

## 2018-02-11 DIAGNOSIS — Z888 Allergy status to other drugs, medicaments and biological substances status: Secondary | ICD-10-CM | POA: Insufficient documentation

## 2018-02-11 DIAGNOSIS — K644 Residual hemorrhoidal skin tags: Secondary | ICD-10-CM | POA: Diagnosis not present

## 2018-02-11 DIAGNOSIS — E785 Hyperlipidemia, unspecified: Secondary | ICD-10-CM | POA: Insufficient documentation

## 2018-02-11 DIAGNOSIS — M25562 Pain in left knee: Secondary | ICD-10-CM

## 2018-02-11 DIAGNOSIS — K219 Gastro-esophageal reflux disease without esophagitis: Secondary | ICD-10-CM | POA: Diagnosis not present

## 2018-02-11 DIAGNOSIS — Z96652 Presence of left artificial knee joint: Secondary | ICD-10-CM | POA: Diagnosis not present

## 2018-02-11 DIAGNOSIS — Z885 Allergy status to narcotic agent status: Secondary | ICD-10-CM | POA: Insufficient documentation

## 2018-02-11 DIAGNOSIS — G4730 Sleep apnea, unspecified: Secondary | ICD-10-CM | POA: Diagnosis not present

## 2018-02-11 DIAGNOSIS — Z860100 Personal history of colon polyps, unspecified: Secondary | ICD-10-CM

## 2018-02-11 DIAGNOSIS — D125 Benign neoplasm of sigmoid colon: Secondary | ICD-10-CM | POA: Diagnosis not present

## 2018-02-11 DIAGNOSIS — Z09 Encounter for follow-up examination after completed treatment for conditions other than malignant neoplasm: Secondary | ICD-10-CM | POA: Diagnosis not present

## 2018-02-11 DIAGNOSIS — M1712 Unilateral primary osteoarthritis, left knee: Secondary | ICD-10-CM

## 2018-02-11 DIAGNOSIS — Z79899 Other long term (current) drug therapy: Secondary | ICD-10-CM | POA: Insufficient documentation

## 2018-02-11 DIAGNOSIS — Z1211 Encounter for screening for malignant neoplasm of colon: Secondary | ICD-10-CM | POA: Insufficient documentation

## 2018-02-11 HISTORY — PX: POLYPECTOMY: SHX5525

## 2018-02-11 HISTORY — PX: COLONOSCOPY: SHX5424

## 2018-02-11 SURGERY — COLONOSCOPY
Anesthesia: Moderate Sedation

## 2018-02-11 MED ORDER — MEPERIDINE HCL 50 MG/ML IJ SOLN
INTRAMUSCULAR | Status: AC
  Filled 2018-02-11: qty 1

## 2018-02-11 MED ORDER — MIDAZOLAM HCL 5 MG/5ML IJ SOLN
INTRAMUSCULAR | Status: DC | PRN
  Administered 2018-02-11: 1 mg via INTRAVENOUS
  Administered 2018-02-11: 3 mg via INTRAVENOUS
  Administered 2018-02-11 (×3): 2 mg via INTRAVENOUS

## 2018-02-11 MED ORDER — SODIUM CHLORIDE 0.9 % IV SOLN
INTRAVENOUS | Status: DC
  Administered 2018-02-11: 09:00:00 via INTRAVENOUS

## 2018-02-11 MED ORDER — PROMETHAZINE HCL 25 MG/ML IJ SOLN
INTRAMUSCULAR | Status: DC | PRN
  Administered 2018-02-11: 12.5 mg via INTRAVENOUS

## 2018-02-11 MED ORDER — SODIUM CHLORIDE 0.9% FLUSH
INTRAVENOUS | Status: AC
  Filled 2018-02-11: qty 10

## 2018-02-11 MED ORDER — MIDAZOLAM HCL 5 MG/5ML IJ SOLN
INTRAMUSCULAR | Status: AC
  Filled 2018-02-11: qty 10

## 2018-02-11 MED ORDER — PROMETHAZINE HCL 25 MG/ML IJ SOLN
INTRAMUSCULAR | Status: AC
  Filled 2018-02-11: qty 1

## 2018-02-11 MED ORDER — MEPERIDINE HCL 50 MG/ML IJ SOLN
INTRAMUSCULAR | Status: DC | PRN
  Administered 2018-02-11 (×2): 25 mg via INTRAVENOUS

## 2018-02-11 MED ORDER — STERILE WATER FOR IRRIGATION IR SOLN
Status: DC | PRN
  Administered 2018-02-11: 09:00:00

## 2018-02-11 NOTE — Op Note (Addendum)
[facility] Patient Name: Christopher [name] Procedure Date: 02/11/2018 8:57 AM MRN: [id number] Date of Birth: [birth year] Attending MD: Hildred Laser , MD CSN: [id number] Age: 62 Admit Type: Outpatient Procedure:                Colonoscopy Indications:              High risk colon cancer surveillance: Personal                            history of colonic polyps Providers:                Hildred Laser, MD, Otis Peak B. Sharon Seller, RN, Aram Candela Referring MD:             Jasper Loser. Luan Pulling, MD Medicines:                Promethazine 12.5 mg IV, Meperidine 50 mg IV,                            Midazolam 10 mg IV Complications:            No immediate complications. Estimated Blood Loss:     Estimated blood loss was minimal. Procedure:                Pre-Anesthesia Assessment:                           - Prior to the procedure, a History and Physical                            was performed, and patient medications and                            allergies were reviewed. The patient's tolerance of                            previous anesthesia was also reviewed. The risks                            and benefits of the procedure and the sedation                            options and risks were discussed with the patient.                            All questions were answered, and informed consent                            was obtained. Prior Anticoagulants: The patient                            last took ibuprofen 2 days prior to the procedure.  ASA Grade Assessment: III - A patient with severe                            systemic disease. After reviewing the risks and                            benefits, the patient was deemed in satisfactory                            condition to undergo the procedure.                           After obtaining informed consent, the colonoscope                            was passed under direct vision.  Throughout the                            procedure, the patient's blood pressure, pulse, and                            oxygen saturations were monitored continuously. The                            [id number] ([id number]) was introduced through the                            anus and advanced to the the cecum, identified by                            appendiceal orifice and ileocecal valve. The                            ileocecal valve, appendiceal orifice, and rectum                            were photographed. The patient tolerated the                            procedure well. The quality of the bowel                            preparation was adequate. Scope In: 9:28:06 AM Scope Out: 9:56:32 AM Scope Withdrawal Time: 0 hours 21 minutes 9 seconds  Total Procedure Duration: 0 hours 28 minutes 26 seconds  Findings:      The perianal and digital rectal examinations were normal.      A 5 mm polyp was found in the hepatic flexure. The polyp was sessile.       The polyp was removed with a cold snare. Resection was complete, but the       polyp tissue was only partially retrieved. The pathology specimen was       placed into Bottle Number 1.      Four sessile polyps were found in the sigmoid colon and splenic flexure.  The polyps were small in size. These polyps were removed with a cold       snare. Resection and retrieval were complete. The pathology specimen was       placed into Bottle Number 1.      External hemorrhoids were found during retroflexion. The hemorrhoids       were small. Impression:               - One 5 mm polyp at the hepatic flexure, removed                            with a cold snare. Complete resection. Partial                            retrieval.                           - Four small polyps in the sigmoid colon and at the                            splenic flexure, removed with a cold snare.                            Resected and retrieved.                            - External hemorrhoids.                           Comment: 9 polyps but all small. Moderate Sedation:      Moderate (conscious) sedation was administered by the endoscopy nurse       and supervised by the endoscopist. The following parameters were       monitored: oxygen saturation, heart rate, blood pressure, CO2       capnography and response to care. Total physician intraservice time was       37 minutes. Recommendation:           - Patient has a contact number available for                            emergencies. The signs and symptoms of potential                            delayed complications were discussed with the                            patient. Return to normal activities tomorrow.                            Written discharge instructions were provided to the                            patient.                           - Resume previous diet today.                           -  Continue present medications.                           - No aspirin, ibuprofen, naproxen, or other                            non-steroidal anti-inflammatory drugs for 2 days.                           - Await pathology results.                           - Repeat colonoscopy in 5 years for surveillance. Procedure Code(s):        --- Professional ---                           [phone], Colonoscopy, flexible; with removal of                            tumor(s), polyp(s), or other lesion(s) by snare                            technique                           G0500, Moderate sedation services provided by the                            same physician or other qualified health care                            professional performing a gastrointestinal                            endoscopic service that sedation supports,                            requiring the presence of an independent trained                            observer to assist in the monitoring of the                             patient's level of consciousness and physiological                            status; initial 15 minutes of intra-service time;                            patient age 91 years or older (additional time may                            be reported with [phone], as appropriate)  99153, Moderate sedation services provided by the                            same physician or other qualified health care                            professional performing the diagnostic or                            therapeutic service that the sedation supports,                            requiring the presence of an independent trained                            observer to assist in the monitoring of the                            patient's level of consciousness and physiological                            status; each additional 15 minutes intraservice                            time (List separately in addition to code for                            primary service) Diagnosis Code(s):        --- Professional ---                           Z86.010, Personal history of colonic polyps                           D12.3, Benign neoplasm of transverse colon (hepatic                            flexure or splenic flexure)                           D12.5, Benign neoplasm of sigmoid colon                           K64.4, Residual hemorrhoidal skin tags CPT copyright 2017 [employer]. All rights reserved. The codes documented in this report are preliminary and upon coder review may  be revised to meet current compliance requirements. Hildred Laser, MD Hildred Laser, MD 02/11/2018 10:10:39 AM This report has been signed electronically. Number of Addenda: 0

## 2018-02-11 NOTE — Discharge Instructions (Signed)
°  Keep ibuprofen use to minimum and take half a dose if possible. Resume other medications and diet as before. No driving for 24 hours. Physician will call with biopsy results. Next colonoscopy in 5 years.   Colonoscopy, Adult, Care After This sheet gives you information about how to care for yourself after your procedure. Your health care provider may also give you more specific instructions. If you have problems or questions, contact your health care provider. What can I expect after the procedure? After the procedure, it is common to have:  A small amount of blood in your stool for 24 hours after the procedure.  Some gas.  Mild abdominal cramping or bloating.  Follow these instructions at home: General instructions   For the first 24 hours after the procedure: ? Do not drive or use machinery. ? Do not sign important documents. ? Do not drink alcohol. ? Do your regular daily activities at a slower pace than normal. ? Eat soft, easy-to-digest foods. ? Rest often.  Take over-the-counter or prescription medicines only as told by your health care provider.  It is up to you to get the results of your procedure. Ask your health care provider, or the department performing the procedure, when your results will be ready. Relieving cramping and bloating  Try walking around when you have cramps or feel bloated.  Apply heat to your abdomen as told by your health care provider. Use a heat source that your health care provider recommends, such as a moist heat pack or a heating pad. ? Place a towel between your skin and the heat source. ? Leave the heat on for 20-30 minutes. ? Remove the heat if your skin turns bright red. This is especially important if you are unable to feel pain, heat, or cold. You may have a greater risk of getting burned. Eating and drinking  Drink enough fluid to keep your urine clear or pale yellow.  Resume your normal diet as instructed by your health care  provider. Avoid heavy or fried foods that are hard to digest.  Avoid drinking alcohol for as long as instructed by your health care provider. Contact a health care provider if:  You have blood in your stool 2-3 days after the procedure. Get help right away if:  You have more than a small spotting of blood in your stool.  You pass large blood clots in your stool.  Your abdomen is swollen.  You have nausea or vomiting.  You have a fever.  You have increasing abdominal pain that is not relieved with medicine. This information is not intended to replace advice given to you by your health care provider. Make sure you discuss any questions you have with your health care provider.

## 2018-02-11 NOTE — H&P (Addendum)
Christopher [name] is an 62 y.o. male.   Chief Complaint: Patient is here for colonoscopy. HPI: Patient is 62 year old Caucasian male who has a history of multiple colonic adenomas and is here for surveillance colonoscopy.  His last exam was in February 2016 with removal of 10 small polyps in the wall adenomas.  He denies abdominal pain change in bowel habits or rectal bleeding. He is a single dose of Advil 2 days ago.   Family history is negative for CRC.  Past Medical History:  Diagnosis Date  . Anxiety   . Arthritis   . Asthma   . Complication of anesthesia    pt had a hard time being able to move after spinal anesthesia , 3-4 hours  . Depression   . GERD (gastroesophageal reflux disease)   . Headache(784.0)    after surgery  . Heart murmur    Years ago- not now  . Hyperlipidemia   . PONV (postoperative nausea and vomiting)   . Sleep apnea    uses CIPAP machine at night    Past Surgical History:  Procedure Laterality Date  . APPENDECTOMY    . BACK SURGERY     neck and back fusion  . BIOPSY  12/27/2015   Procedure: BIOPSY;  Surgeon: Rogene Houston, MD;  Location: [facility];  Service: Endoscopy;;  Fundus biopsies and duodenal biopsies  . CARDIAC CATHETERIZATION    . CARDIAC CATHETERIZATION N/A 09/04/2016   Procedure: Right/Left Heart Cath and Coronary Angiography;  Surgeon: Peter M Martinique, MD;  Location: [place] [facility];  Service: Cardiovascular;  Laterality: N/A;  . CHONDROPLASTY  08/15/2011   Procedure: CHONDROPLASTY;  Surgeon: [name], MD;  Location: [facility];  Service: Orthopedics;  Laterality: Left;  . COLONOSCOPY  06/27/2011   Procedure: COLONOSCOPY;  Surgeon: Rogene Houston, MD;  Location: [facility];  Service: Endoscopy;  Laterality: N/A;  9:00 / Pt to be here at 9am for 10:45 procedure, benign polyps removed  . COLONOSCOPY N/A 10/05/2014   Procedure: COLONOSCOPY;  Surgeon: Rogene Houston, MD;  Location: [facility];  Service: Endoscopy;  Laterality:  N/A;  930  . ESOPHAGOGASTRODUODENOSCOPY N/A 12/27/2015   Procedure: ESOPHAGOGASTRODUODENOSCOPY (EGD);  Surgeon: Rogene Houston, MD;  Location: [facility];  Service: Endoscopy;  Laterality: N/A;  3:00  . HERNIA REPAIR     umbilical hernia  . KNEE ARTHROSCOPY     left knee  . KNEE ARTHROSCOPY     right knee   . LUMBAR LAMINECTOMY/DECOMPRESSION MICRODISCECTOMY  09/14/2012   Procedure: LUMBAR LAMINECTOMY/DECOMPRESSION MICRODISCECTOMY 1 LEVEL;  Surgeon: Floyce Stakes, MD;  Location: [place] [facility];  Service: Neurosurgery;  Laterality: Right;  Right Lumbar three-four Diskectomy  . neck fusion    . SHOULDER ARTHROSCOPY WITH BICEPSTENOTOMY Left 09/23/2013   Procedure: SHOULDER ARTHROSCOPY WITH BICEPSTENOTOMY AND EXTENSIVE DEBRIDEMENT;  Surgeon: Carole Civil, MD;  Location: [facility];  Service: Orthopedics;  Laterality: Left;  . SHOULDER ARTHROSCOPY WITH ROTATOR CUFF REPAIR Left 05/05/2014   Procedure: SHOULDER ARTHROSCOPY LIMITED DEBRIDEMENT;  Surgeon: Carole Civil, MD;  Location: [facility];  Service: Orthopedics;  Laterality: Left;  . SHOULDER OPEN ROTATOR CUFF REPAIR Left 05/05/2014   Procedure: ROTATOR CUFF REPAIR SHOULDER OPEN;  Surgeon: Carole Civil, MD;  Location: [facility];  Service: Orthopedics;  Laterality: Left;  . SHOULDER SURGERY Right    Open Mumford procedure  . SPINAL FUSION     x 2  . TOTAL KNEE ARTHROPLASTY Left  06/16/2017   Procedure: LEFT TOTAL KNEE ARTHROPLASTY;  Surgeon: Carole Civil, MD;  Location: [facility];  Service: Orthopedics;  Laterality: Left;    Family History  Problem Relation Age of Onset  . Diabetes Unknown   . Lung disease Unknown   . Arthritis Unknown   . Anesthesia problems Neg Hx   . Hypotension Neg Hx   . Malignant hyperthermia Neg Hx   . Pseudochol deficiency Neg Hx    Social History:  reports that he has been smoking cigarettes.  He has a 41.00 pack-year smoking history. He has never used smokeless tobacco. He reports that he does not  drink alcohol or use drugs.  Allergies:  Allergies  Allergen Reactions  . Celebrex [Celecoxib] Itching, Swelling and Other (See Comments)    All over  . Codeine Nausea And Vomiting and Other (See Comments)    Extreme stomach pain. This includes anything with the derivative of codeine in it.  . Doxycycline Swelling and Other (See Comments)    Made tongue turn black   . Cortisone Swelling  . Oxycodone-Acetaminophen Itching and Other (See Comments)    Can not  tolerate with benadryl   . Prednisone Swelling  . Relafen [Nabumetone] Swelling    Medications Prior to Admission  Medication Sig Dispense Refill  . ALPRAZolam (XANAX) 0.5 MG tablet Take 0.5 mg by mouth 3 (three) times daily as needed for anxiety or sleep.     Marland Kitchen dicyclomine (BENTYL) 10 MG capsule TAKE 1 CAPSULE THREE TIMES DAILY BETWEEN MEALS 90 capsule 5  . HYDROcodone-acetaminophen (NORCO) 5-325 MG tablet Take 1 tablet by mouth every 6 (six) hours as needed for moderate pain. (Patient taking differently: Take 1 tablet by mouth 3 (three) times daily as needed for moderate pain. ) 30 tablet 0  . ibuprofen (ADVIL,MOTRIN) 800 MG tablet Take 1 tablet (800 mg total) by mouth every 8 (eight) hours as needed. (Patient taking differently: Take 800 mg by mouth every 8 (eight) hours as needed for headache or moderate pain. ) 90 tablet 0  . Menthol, Topical Analgesic, (ARCTIC RELIEF EX) Apply 1 application topically as needed (for pain).    . methocarbamol (ROBAXIN) 500 MG tablet Take 1 tablet (500 mg total) by mouth every 6 (six) hours as needed for muscle spasms. 60 tablet 1  . pantoprazole (PROTONIX) 40 MG tablet Take 40 mg by mouth daily.     Marland Kitchen PROAIR HFA 108 (90 Base) MCG/ACT inhaler Inhale 3 puffs into the lungs every 6 (six) hours as needed for wheezing or shortness of breath.     . sertraline (ZOLOFT) 50 MG tablet Take 50 mg by mouth daily.     . temazepam (RESTORIL) 30 MG capsule Take 30 mg by mouth at bedtime.     Marland Kitchen apixaban  (ELIQUIS) 2.5 MG TABS tablet Take 1 tablet (2.5 mg total) by mouth every 12 (twelve) hours. (Patient not taking: Reported on 02/09/2018) 56 tablet 0  . nitroGLYCERIN (NITROSTAT) 0.4 MG SL tablet Place 1 tablet (0.4 mg total) under the tongue every 5 (five) minutes as needed. (Patient taking differently: Place 0.4 mg under the tongue every 5 (five) minutes as needed for chest pain. ) 25 tablet 3    No results found for this or any previous visit (from the past 48 hour(s)). No results found.  ROS  Blood pressure (!) 128/58, pulse (!) 48, temperature 97.7 F (36.5 C), temperature source Oral, resp. rate 16, SpO2 97 %. Physical Exam  Constitutional: He  appears well-developed and well-nourished.  HENT:  Mouth/Throat: Oropharynx is clear and moist.  Eyes: Conjunctivae are normal. No scleral icterus.  Neck: No thyromegaly present.  Cardiovascular: Normal rate, regular rhythm and normal heart sounds.  No murmur heard. Respiratory: Effort normal and breath sounds normal.  GI:  Abdomen is protuberant.  There is a small supraumbilical and appendectomy scar.  Abdomen is soft and nontender with organomegaly or masses.  Musculoskeletal: He exhibits no edema.  Lymphadenopathy:    He has no cervical adenopathy.  Neurological: He is alert.  Skin: Skin is warm and dry.     Assessment/Plan History of colonic adenomas. Surveillance colonoscopy.  Hildred Laser, MD 02/11/2018, 9:13 AM

## 2018-02-19 ENCOUNTER — Encounter (HOSPITAL_COMMUNITY): Payer: Self-pay | Admitting: Internal Medicine

## 2018-03-23 ENCOUNTER — Ambulatory Visit: Payer: Self-pay | Admitting: Orthopedic Surgery

## 2018-04-23 ENCOUNTER — Encounter

## 2018-04-23 ENCOUNTER — Ambulatory Visit: Payer: PPO | Admitting: Orthopedic Surgery

## 2018-04-23 ENCOUNTER — Encounter: Payer: Self-pay | Admitting: Orthopedic Surgery

## 2018-04-23 DIAGNOSIS — G8929 Other chronic pain: Secondary | ICD-10-CM

## 2018-04-23 DIAGNOSIS — M25512 Pain in left shoulder: Secondary | ICD-10-CM | POA: Diagnosis not present

## 2018-04-23 NOTE — Progress Notes (Signed)
Chief Complaint  Patient presents with  . Shoulder Pain    Bilat shoudler pain    BP 136/77   Pulse (!) 56   Ht 5\' 6"  (1.676 m)   Wt 214 lb (97.1 kg)   BMI 34.68 kg/m   62 year old male with previous rotator cuff repair many years ago presents with 46-month history of increasing pain loss of motion weakness in his left shoulder severe pain at night when he rolls onto his left side complains of posterior lateral joint pain pain with external rotation and weakness with forward elevation  Review of systems burning sensation left shoulder history of back pain status post lumbar fusion  Exam shows a well-developed well-nourished male awake alert and oriented x3 mood and affect normal no gross sensory deficits weakness in external rotation forward elevation severe crepitance in this left shoulder skin previous incisions are normal no erythema good distal pulse no instability  Impingement sign positive empty can test positive drop arm test negative  MRI 2015 showed partial undersurface tear left rotator cuff posterior labral tear subchondral cyst formation posterior glenoid  MRI left shoulder look for new rotator cuff tear

## 2018-05-01 ENCOUNTER — Other Ambulatory Visit: Payer: Self-pay | Admitting: Internal Medicine

## 2018-05-09 ENCOUNTER — Ambulatory Visit
Admission: RE | Admit: 2018-05-09 | Discharge: 2018-05-09 | Disposition: A | Discharge status: Home / Self Care | Payer: PPO | Source: Ambulatory Visit | Attending: Orthopedic Surgery | Admitting: Orthopedic Surgery

## 2018-05-09 DIAGNOSIS — M25512 Pain in left shoulder: Principal | ICD-10-CM

## 2018-05-09 DIAGNOSIS — G8929 Other chronic pain: Secondary | ICD-10-CM

## 2018-05-09 DIAGNOSIS — M75112 Incomplete rotator cuff tear or rupture of left shoulder, not specified as traumatic: Secondary | ICD-10-CM | POA: Diagnosis not present

## 2018-05-13 DIAGNOSIS — Z1211 Encounter for screening for malignant neoplasm of colon: Secondary | ICD-10-CM | POA: Diagnosis not present

## 2018-05-13 DIAGNOSIS — Z0000 Encounter for general adult medical examination without abnormal findings: Secondary | ICD-10-CM | POA: Diagnosis not present

## 2018-05-13 DIAGNOSIS — Z79891 Long term (current) use of opiate analgesic: Secondary | ICD-10-CM | POA: Diagnosis not present

## 2018-05-13 DIAGNOSIS — F17200 Nicotine dependence, unspecified, uncomplicated: Secondary | ICD-10-CM | POA: Diagnosis not present

## 2018-05-13 DIAGNOSIS — Z23 Encounter for immunization: Secondary | ICD-10-CM | POA: Diagnosis not present

## 2018-05-14 DIAGNOSIS — N400 Enlarged prostate without lower urinary tract symptoms: Secondary | ICD-10-CM | POA: Diagnosis not present

## 2018-05-14 DIAGNOSIS — F17200 Nicotine dependence, unspecified, uncomplicated: Secondary | ICD-10-CM | POA: Diagnosis not present

## 2018-05-14 DIAGNOSIS — J449 Chronic obstructive pulmonary disease, unspecified: Secondary | ICD-10-CM | POA: Diagnosis not present

## 2018-05-14 DIAGNOSIS — K580 Irritable bowel syndrome with diarrhea: Secondary | ICD-10-CM | POA: Diagnosis not present

## 2018-05-14 DIAGNOSIS — I10 Essential (primary) hypertension: Secondary | ICD-10-CM | POA: Diagnosis not present

## 2018-05-14 DIAGNOSIS — E785 Hyperlipidemia, unspecified: Secondary | ICD-10-CM | POA: Diagnosis not present

## 2018-05-14 DIAGNOSIS — K760 Fatty (change of) liver, not elsewhere classified: Secondary | ICD-10-CM | POA: Diagnosis not present

## 2018-05-14 DIAGNOSIS — G4730 Sleep apnea, unspecified: Secondary | ICD-10-CM | POA: Diagnosis not present

## 2018-05-14 DIAGNOSIS — Z0000 Encounter for general adult medical examination without abnormal findings: Secondary | ICD-10-CM | POA: Diagnosis not present

## 2018-05-14 DIAGNOSIS — R739 Hyperglycemia, unspecified: Secondary | ICD-10-CM | POA: Diagnosis not present

## 2018-05-21 ENCOUNTER — Ambulatory Visit (INDEPENDENT_AMBULATORY_CARE_PROVIDER_SITE_OTHER): Payer: PPO

## 2018-05-21 ENCOUNTER — Ambulatory Visit: Payer: PPO | Admitting: Orthopedic Surgery

## 2018-05-21 ENCOUNTER — Encounter: Payer: Self-pay | Admitting: Orthopedic Surgery

## 2018-05-21 ENCOUNTER — Telehealth: Payer: Self-pay | Admitting: Radiology

## 2018-05-21 VITALS — BP 128/71 | HR 69 | Ht 66.0 in | Wt 214.0 lb

## 2018-05-21 DIAGNOSIS — G8929 Other chronic pain: Secondary | ICD-10-CM

## 2018-05-21 DIAGNOSIS — M25512 Pain in left shoulder: Secondary | ICD-10-CM

## 2018-05-21 DIAGNOSIS — M19019 Primary osteoarthritis, unspecified shoulder: Secondary | ICD-10-CM

## 2018-05-21 NOTE — Telephone Encounter (Signed)
Call to schedule left shoulder injection at [facility]

## 2018-05-21 NOTE — Progress Notes (Signed)
Chief Complaint  Patient presents with  . Shoulder Pain    Recheck on lleft shoulder, MRI results.   62 year old male status post rotator cuff repair many years ago complains of deep dull aching pain left shoulder for several weeks now  MRI was obtained to evaluate his rotator cuff as he was having pain with flexion and abduction and weakness when he was lifting heavy objects  He is currently on 10 mg hydrocodone for his back which does not help his shoulder he also takes ibuprofen without relief of his shoulder pain  Review of systems joint pain back pain otherwise fairly stable  Shoulder has tenderness in the front of the shoulder anteriorly painful abduction and flexion with weakness no instability skin is warm dry and intact distal neurovascular function is normal  Took an additional set of x-rays of the shoulder please see the report but 3 views of the shoulder do show that he has glenohumeral arthritis without significant amount of joint space narrowing is no posterior erosion is a large inferior osteophyte.   MRI REPORT: IMPRESSION: 1. Partial-thickness distal articular surface tearing of the subscapularis tendon, worsened from previous. No supraspinatus or infraspinatus tear. Prior rotator cuff repair. 2. Intra-articular segment of the long head of the biceps is absent, compatible with biceps tenotomy or tenodesis. 3. Progressive and severe degenerative chondral thinning along the humeral head with increase in degenerative subcortical cystic lesions along the glenoid, and prominent increase in humeral head spurring. 4. Degeneration of the posterosuperior labrum without a well-defined tear.   I agree with the report he has a partial-thickness distal articular surface or undersurface subscapularis tear slightly worse than previous MRI but his rotator cuff is intact he has progressive severe degenerative chondral thinning of the humeral head  We are going to try intra-articular  injection if that does not help we will send him for consultation for possible shoulder replacement  Encounter Diagnoses  Name Primary?  . Chronic left shoulder pain   . Arthritis, shoulder region Yes

## 2018-05-24 NOTE — Telephone Encounter (Signed)
945 arrival on Wed 05/26/18 for the shoulder injection at [facility] called to advise

## 2018-05-26 ENCOUNTER — Ambulatory Visit (HOSPITAL_COMMUNITY)
Admission: RE | Admit: 2018-05-26 | Discharge: 2018-05-26 | Disposition: A | Discharge status: Home / Self Care | Payer: PPO | Source: Ambulatory Visit | Attending: Orthopedic Surgery | Admitting: Orthopedic Surgery

## 2018-05-26 ENCOUNTER — Encounter (HOSPITAL_COMMUNITY): Payer: Self-pay

## 2018-05-26 DIAGNOSIS — M25512 Pain in left shoulder: Secondary | ICD-10-CM | POA: Insufficient documentation

## 2018-05-26 DIAGNOSIS — G8929 Other chronic pain: Secondary | ICD-10-CM | POA: Insufficient documentation

## 2018-05-26 MED ORDER — LIDOCAINE HCL (PF) 1 % IJ SOLN
INTRAMUSCULAR | Status: AC
  Administered 2018-05-26: 10 mL
  Filled 2018-05-26: qty 5

## 2018-05-26 MED ORDER — METHYLPREDNISOLONE ACETATE 40 MG/ML IJ SUSP
INTRAMUSCULAR | Status: AC
  Administered 2018-05-26: 40 mg
  Filled 2018-05-26: qty 1

## 2018-05-26 MED ORDER — LIDOCAINE HCL (PF) 1 % IJ SOLN
INTRAMUSCULAR | Status: AC
  Filled 2018-05-26: qty 5

## 2018-05-26 MED ORDER — IOPAMIDOL (ISOVUE-300) INJECTION 61%
INTRAVENOUS | Status: AC
  Administered 2018-05-26: 2 mL
  Filled 2018-05-26: qty 50

## 2018-05-26 NOTE — Procedures (Signed)
Preprocedure Dx: LEFT shoulder pain, chronic Postprocedure Dx: LEFT shoulder pain, chronic Procedure  Fluoroscopically guided LEFT shoulder joint therapeutic injection Radiologist:  Thornton Papas Anesthesia:  3.5 ml of 1% lidocaine Injectate:  40 mg depo-medrol, 3 ml 1% lidocaine Fluoro time:  1 minutes 54 seconds EBL:   < 1 ml Complications: None

## 2018-06-14 ENCOUNTER — Ambulatory Visit (INDEPENDENT_AMBULATORY_CARE_PROVIDER_SITE_OTHER): Payer: PPO

## 2018-06-14 ENCOUNTER — Ambulatory Visit: Payer: PPO | Admitting: Orthopedic Surgery

## 2018-06-14 ENCOUNTER — Encounter: Payer: Self-pay | Admitting: Orthopedic Surgery

## 2018-06-14 VITALS — BP 115/65 | HR 58 | Ht 66.0 in | Wt 217.0 lb

## 2018-06-14 DIAGNOSIS — M25512 Pain in left shoulder: Secondary | ICD-10-CM

## 2018-06-14 DIAGNOSIS — M19019 Primary osteoarthritis, unspecified shoulder: Secondary | ICD-10-CM | POA: Diagnosis not present

## 2018-06-14 DIAGNOSIS — Z96652 Presence of left artificial knee joint: Secondary | ICD-10-CM | POA: Diagnosis not present

## 2018-06-14 DIAGNOSIS — G8929 Other chronic pain: Secondary | ICD-10-CM

## 2018-06-14 NOTE — Progress Notes (Signed)
ANNUAL FOLLOW UP FOR left TKA   Chief Complaint  Patient presents with  . Knee Pain    Left knee DOS 06/16/17     HPI: The patient is here for the annual  follow-up x-ray for knee replacement. The patient is not complaining of pain weakness instability or stiffness in the repaired knee.   Review of Systems  Musculoskeletal:       LEFT AND RIGHT SHOULDER PAIN     Past Medical History:  Diagnosis Date  . Anxiety   . Arthritis   . Asthma   . Complication of anesthesia    pt had a hard time being able to move after spinal anesthesia , 3-4 hours  . Depression   . GERD (gastroesophageal reflux disease)   . Headache(784.0)    after surgery  . Heart murmur    Years ago- not now  . Hyperlipidemia   . PONV (postoperative nausea and vomiting)   . Sleep apnea    uses CIPAP machine at night     Examination of the LEFT KNEE  BP 115/65   Pulse (!) 58   Ht 5\' 6"  (1.676 m)   Wt 217 lb (98.4 kg)   BMI 35.02 kg/m   General the patient is normally groomed in no distress  Mood normal Affect pleasant   The patient is Awake and alert ; oriented normal   Inspection shows : incision healed nicely without erythema, no tenderness no swelling  Range of motion total range of motion is 120  Stability the knee is stable anterior to posterior as well as medial to lateral  Strength quadriceps strength is normal  Skin no erythema around the skin incision  Cardiovascular NO EDEMA   Neuro: normal sensation in the operative leg   Gait: normal expected gait without cane    Medical decision-making section  X-rays ordered with the following personal interpretation  Normal alignment without loosening   Diagnosis  Encounter Diagnosis  Name Primary?  . S/P total knee replacement, left 06/16/17 Yes     Plan follow-up 1 year repeat x-rays

## 2018-06-14 NOTE — Patient Instructions (Signed)
Reverse Total Shoulder Replacement Reverse total shoulder replacement is a surgical procedure to replace the shoulder joint. You may need this surgery if your rotator cuff is torn and cannot be repaired. The rotator cuff is a group of muscles and tough, cord-like tissues that connect muscle to bone (tendons) in the shoulder joint. The rotator cuff helps you lift your arm. You may also need a reverse total shoulder replacement if you have:  A previously unsuccessful normal shoulder replacement.  Severe pain that keeps you from lifting your arm.  A severe fracture of your shoulder joint.  Repeated dislocations of your shoulder joint.  A tumor in your shoulder joint.  The shoulder is a ball-and-socket joint. The top of the upper arm bone (humerus) is shaped like a ball, and it fits into the socket of the shoulder blade (scapula). During a normal shoulder replacement, a plastic cup replaces the socket, and a metal ball replaces the ball of the humerus. This allows the rotator cuff to lift the arm, like it normally does. During a reverse total shoulder replacement, the plastic socket is placed into the top of the humerus, and the metal ball is placed into the shoulder socket. This means that the positions of the ball and socket are reversed. This lets you use other shoulder muscles to lift your arm, instead of using the rotator cuff. Tell a health care provider about:  Any allergies you have.  All medicines you are taking, including vitamins, herbs, eye drops, creams, and over-the-counter medicines.  Any problems you or family members have had with anesthetic medicines.  Any blood disorders you have.  Any surgeries you have had.  Any medical conditions you have.  Whether you are pregnant or may be pregnant. What are the risks? Generally, this is a safe procedure. However, problems may occur, including:  Infection.  Bleeding.  Allergic reactions to medicines.  Damage to other  structures and organs, such as blood vessels or nerves. Nerve damage can cause tingling, weakness, or numbness.  The ball and socket coming apart (dislocation).  Shoulder pain.  Poor return of shoulder movement.  Loosening of the new shoulder parts over time, which may require replacement.  What happens before the procedure? Medicines  Ask your health care provider about: ? Changing or stopping your regular medicines. This is especially important if you are taking diabetes medicines or blood thinners. ? Taking medicines such as aspirin and ibuprofen. These medicines can thin your blood. Do not take these medicines before your procedure if your health care provider instructs you not to.  You may be given antibiotic medicine to help prevent infection. Staying hydrated Follow instructions from your health care provider about hydration, which may include:  Up to 2 hours before the procedure - you may continue to drink clear liquids, such as water, clear fruit juice, black coffee, and plain tea.  Eating and drinking restrictions Follow instructions from your health care provider about eating and drinking, which may include:  8 hours before the procedure - stop eating heavy meals or foods such as meat, fried foods, or fatty foods.  6 hours before the procedure - stop eating light meals or foods, such as toast or cereal.  6 hours before the procedure - stop drinking milk or drinks that contain milk.  2 hours before the procedure - stop drinking clear liquids.  General instructions  You may have tests, such as: ? Blood tests. ? Chest X-rays. ? Heart tests.  Do not use any  products that contain nicotine or tobacco, such as cigarettes and e-cigarettes. If you need help quitting, ask your health care provider.  Plan to have someone take you home from the hospital or clinic.  Plan to have someone help around the house for a few weeks after your procedure. What happens during the  procedure?  To reduce your risk of infection: ? Your health care team will wash or sanitize their hands. ? Your skin will be washed with soap.  An IV tube will be inserted into one of your veins.  You will be given one or more of the following: ? A medicine that helps you relax (sedative). ? A medicine that makes you fall asleep (general anesthetic). ? A medicine that is injected into an area of your body to numb everything below the injection site (regional anesthetic).  Small monitors will be put on your body to check your heart, blood pressure, and oxygen level.  An incision will be made in the front or the top of your shoulder.  Your shoulder joint will be opened, and the ball of the humerus will be removed from the socket.  Your shoulder joint will be cleaned out and prepared for the replacement.  A metal plate will be screwed into your scapula. Then, a metal ball will be screwed onto the plate.  The plastic socket will be inserted into the top of your humerus and held in place.  The plastic socket will be positioned onto the metal ball and fixed into place.  Your incision will be closed with stitches (sutures) or staples.  Your incision will be covered with a bandage (dressing) or other wound covering.  Your arm will be put in a sling. This will keep your arm still while it heals. The procedure may vary among health care providers and hospitals. What happens after the procedure?  Your blood pressure, heart rate, breathing rate, and blood oxygen level will be monitored until the medicines you were given have worn off.  You may continue to receive fluids and medicines, such as pain medicines or antibiotics, through an IV tube.  You will be shown shoulder exercises to do at home.  Do not drive for 24 hours if you received a sedative. Ask your health care provider when it is safe for you to drive. This information is not intended to replace advice given to you by your  health care provider. Make sure you discuss any questions you have with your health care provider. Document Released: 02/19/2016 Document Revised: 03/07/2016 Document Reviewed: 02/19/2016 Elsevier Interactive Patient Education  2018 [employer]. Preparing for Shoulder Replacement Getting prepared before shoulder replacement surgery can make your recovery easier and more comfortable. This document provides some tips and guidelines that will help you prepare for your surgery. How should I arrange for help? In the first couple of weeks after surgery, it may be harder to do some of your regular activities. You may get tired easily, and you may have limited movement in your arm. Follow these guidelines to make sure you have all the help you need after your surgery:  Plan to have someone take you home from the hospital or clinic. Your health care provider will tell you how many days you can expect to be in the hospital.  Cancel all work, caregiving, and volunteer responsibilities for several weeks after your surgery.  If you live alone, arrange for someone to take care of your home and pets for the first several weeks after  surgery.  Plan to have someone stay with you day and night for the first week. This person should be someone you are comfortable with. You may need this person to help you with your exercises and personal care, such as bathing and using the toilet.  Arrange for drivers to take you to and from follow-up appointments, the grocery store, and other places you may need to go for at least 2-4 weeks.  How should I prepare my home? Here are some ways to prepare your home before surgery:  Pick a recovery spot, but do not plan on recovering in bed. Sitting upright is better for your health.You may want to use a recliner with a small table nearby. Place the items you use most frequently on that table or near your recovery spot. These items may include the TV remote, a cordless phone, your  cell phone, a book or laptop computer, and a water glass.  Remove all clutter from your floors. Also remove any throw rugs.  Move the items you use most often from your kitchen, bathroom, and bedroom to shelves and drawers that are at countertop height.  Prepare a few meals to freeze and reheat later.  Consider adding grab bars in the shower and near the toilet.  How should I prepare my body? Here are some ways to prepare your body for surgery:  Have a preoperative exam with your primary health care provider. ? During the exam, your health care provider will make sure that your body is healthy enough to safely have this surgery. ? To your exam, take a complete list of all your medicines and supplements, including herbs and vitamins. ? You may need to have additional tests to ensure your safety.  Have elective dental care and routine cleanings done before your surgery. Germs from anywhere in your body, including your mouth, can travel to your new joint and infect it. It is important that you do not have any dental work done for at least 3 months after your surgery.  Maintain a healthy diet. Do not change your diet before surgery unless your health care provider advised you to do that.  Do not use any products that contain nicotine or tobacco, such as cigarettes or e-cigarettes. Tobacco and nicotine can delay bone healing. If you need help quitting, ask your health care provider.  If your health care provider recommended starting physical therapy before your surgery, make sure to do strength and flexibility exercises as told by your health care provider or physical therapist.  Other preparations Here are some other things you should do before your surgery:  Keep all visits as told by your health care provider. This is important. You may have additional exams, X-rays, or other tests.  Ask your health care provider about: ? Changing or stopping your regular medicines. This is especially  important if you are taking diabetes medicines or blood thinners. ? Taking medicines such as aspirin and ibuprofen. These medicines can thin your blood. Do not take these medicines before your procedure if your health care provider instructs you not to.  To ease concerns about your financial responsibilities, call your insurance company as soon as you decide to have surgery. Ask how much of your surgery and hospital stay will be covered. Also ask about coverage for medical equipment, rehabilitation facilities, and home care.  The day before your surgery, follow instructions from your health care provider for showering, eating, drinking, and taking medicines. These directions are for your safety.  Bring a loose fitting button-up shirt to wear home when you leave the hospital. Choose a shirt that will be able to fit around or over your sling.  Bring pants that have an elastic waist and shoes that you can easily slip on.  This information is not intended to replace advice given to you by your health care provider. Make sure you discuss any questions you have with your health care provider. Document Released: 08/21/2016 Document Revised: 08/21/2016 Document Reviewed: 08/21/2016 Elsevier Interactive Patient Education  2018 [employer]. Reverse Total Shoulder Replacement, Care After This sheet gives you information about how to care for yourself after your procedure. Your health care provider may also give you more specific instructions. If you have problems or questions, contact your health care provider. What can I expect after the procedure? After the procedure, it is common to have:  Pain.  Stiffness.  Follow these instructions at home: If you have a sling:  Wear the sling as told by your health care provider. Remove it only as told by your health care provider.  Loosen the sling if your fingers tingle, become numb, or turn cold and blue.  Keep the sling clean.  If the sling is not  waterproof, do not let it get wet. Bathing  Do not take baths, swim, or use a hot tub until your health care provider approves. Ask your health care provider if you may take showers. You may only be allowed to take sponge baths for bathing.  If your sling is not waterproof, cover it with a watertight covering when you take a bath or shower.  Keep your bandage (dressing) dry until your health care provider says it can be removed. Incision care   Follow instructions from your health care provider about how to take care of your incision. Make sure you: ? Wash your hands with soap and water before you change your bandage (dressing). If soap and water are not available, use hand sanitizer. ? Change your dressing as told by your health care provider. ? Leave stitches (sutures), skin glue, or adhesive strips in place. These skin closures may need to stay in place for 2 weeks or longer. If adhesive strip edges start to loosen and curl up, you may trim the loose edges. Do not remove adhesive strips completely unless your health care provider tells you to do that.  Check your incision every day for signs of infection. Check for: ? More redness, swelling, or pain. ? More fluid or blood. ? Warmth. ? Pus or a bad smell. Driving  Ask your health care provider when it is safe for you to drive.  Do not drive or use heavy machinery while taking prescription pain medicine.  Do not drive for 24 hours if you were given a medicine to help you relax (sedative). Activity  Return to your normal activities as told by your health care provider. Ask your health care provider what activities are safe for you.  Do shoulder exercises as told by your health care provider.  Do not lift your arm above shoulder level until your health care provider approves.  Do not make large arm movements.  Do not push or pull things until your health care provider approves.  Do not lift anything that is heavier than 5 lbs  (2.3 kg) until your health care provider approves. Managing pain, stiffness, and swelling   If directed, put ice on your shoulder. ? Put ice in a plastic bag. ? Place a towel  between your skin and the bag. ? Leave the ice on for 20 minutes, 2-3 times a day.  Move your fingers and hand often to avoid stiffness and to lessen swelling. General instructions  Do not use any products that contain nicotine or tobacco, such as cigarettes and e-cigarettes. These can delay bone healing. If you need help quitting, ask your health care provider.  To prevent or treat constipation while you are taking prescription pain medicine, your health care provider may recommend that you: ? Drink enough fluid to keep your urine clear or pale yellow. ? Take over-the-counter or prescription medicines. ? Eat foods that are high in fiber, such as fresh fruits and vegetables, whole grains, and beans. ? Limit foods that are high in fat and processed sugars, such as fried and sweet foods.  Take over-the-counter and prescription medicines only as told by your health care provider.  Keep all follow-up visits as told by your health care provider. This is important. Contact a health care provider if:  You feel nauseous or you vomit.  You are constipated. Constipation is when you have: ? Fewer bowel movements in a week than normal. ? Difficulty having a bowel movement. ? Stools that are dry, hard, or larger than normal.  Your arm tingles or feels numb.  Your pain gets worse, even after taking pain medicine.  You have more redness, swelling, or pain around your incision.  You have more fluid or blood coming from your incision.  Your incision feels warm to the touch.  You have pus or a bad smell coming from your incision.  You have a fever. Get help right away if:  Your shoulder joint moves out of place.  Your incision comes apart. This information is not intended to replace advice given to you by your  health care provider. Make sure you discuss any questions you have with your health care provider. Document Released: 02/19/2016 Document Revised: 03/07/2016 Document Reviewed: 02/19/2016 Elsevier Interactive Patient Education  [employer].

## 2018-06-18 ENCOUNTER — Ambulatory Visit: Payer: Self-pay | Admitting: Orthopedic Surgery

## 2018-06-25 ENCOUNTER — Ambulatory Visit (INDEPENDENT_AMBULATORY_CARE_PROVIDER_SITE_OTHER): Payer: PPO | Admitting: Orthopedic Surgery

## 2018-06-25 ENCOUNTER — Encounter (INDEPENDENT_AMBULATORY_CARE_PROVIDER_SITE_OTHER): Payer: Self-pay | Admitting: Orthopedic Surgery

## 2018-06-25 DIAGNOSIS — M19019 Primary osteoarthritis, unspecified shoulder: Secondary | ICD-10-CM

## 2018-06-28 ENCOUNTER — Encounter (INDEPENDENT_AMBULATORY_CARE_PROVIDER_SITE_OTHER): Payer: Self-pay | Admitting: Orthopedic Surgery

## 2018-06-28 NOTE — Progress Notes (Signed)
Office Visit Note   Patient: Christopher [name]           Date of Birth: May 04, 1956           MRN: [id number] Visit Date: 06/25/2018 Requested by: Carole Civil, [address] [place], [place] [id number] PCP: Sinda Du, MD  Subjective: Chief Complaint  Patient presents with  . Left Shoulder - Pain    HPI: Deitrick is a patient with chronic left shoulder pain.  He states the pain is getting worse.  He describes both pain as well as diminished range of motion.  He is retired from doing body work.  He did have a glenohumeral injection 2 weeks ago which gave him some relief but it was very short-lived.  The pain does wake him from sleep at night.  He did have a left shoulder arthroscopy in 20152.  He also has history of back and neck surgery and takes Norco twice a day.  He does have a history of smoking and sleep apnea.  MRI scan is reviewed and shows the rotator cuff to be intact.  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              ROS: All systems reviewed are negative as they relate to the chief complaint within the history of present illness.  Patient denies  fevers or chills.   Assessment & Plan: Visit Diagnoses:  1. Shoulder arthritis     Plan: Impression is left shoulder arthritis.  Plan at this time is shoulder replacement after extensive discussion with the patient about the risk and benefits of the procedure.  They include but not limited to infection nerve vessel damage incomplete restoration of function range of motion as well as 10 to 15-year longevity of the implants.  Patient understands and wishes to proceed.  Follow-Up Instructions: No follow-ups on file.   Orders:  Orders Placed This Encounter  Procedures  . CT SHOULDER LEFT WO CONTRAST   No orders of the defined types were placed in this encounter.     Procedures: No procedures performed   Clinical Data: No additional findings.  Objective: Vital Signs: There  were no vitals taken for this visit.  Physical Exam:   Constitutional: Patient appears well-developed HEENT:  Head: Normocephalic Eyes:EOM are normal Neck: Normal range of motion Cardiovascular: Normal rate Pulmonary/chest: Effort normal Neurologic: Patient is alert Skin: Skin is warm Psychiatric: Patient has normal mood and affect    Ortho Exam: Ortho exam demonstrates reasonable cervical spine range of motion with no definite paresthesias in the left arm.  Radial pulses intact.  Patient has reasonable rotator cuff strength in his supraspinatus and subscap muscle testing but does have diminished functional range of motion above shoulder level.  There is some coarseness and grinding with passive range of motion of the shoulder.  He does have maintained external rotation to about 30 degrees.  No warmth to the shoulder girdle region  Specialty Comments:  No specialty comments available.  Imaging: No results found.   PMFS History: Patient Active Problem List   Diagnosis Date Noted  . History of colonic polyps 11/26/2017  . S/P total knee replacement, left 06/16/17 06/16/2017  . Primary osteoarthritis of left knee   . Hyperlipidemia 09/04/2016  . Angina pectoris ([facility]) 09/04/2016  . OSA (obstructive sleep apnea) 09/04/2016  . COPD (chronic obstructive pulmonary disease) ([facility]) 09/04/2016  . Rotator cuff tear 05/05/2014  . S/P shoulder surgery 09/26/2013  .  Arthritis, shoulder region 09/26/2013  . Bursitis, shoulder 09/26/2013  . Synovitis of shoulder 09/26/2013  . Labral tear of shoulder, degenerative 09/26/2013  . Biceps tendon tear 09/26/2013  . Rotator cuff syndrome of left shoulder 06/21/2013  . Arthritis 06/21/2013  . Patellar tendinitis 03/29/2013  . Effusion of knee joint 03/29/2013  . Bursitis/tendonitis, shoulder 03/10/2013  . Effusion of knee joint, left 08/18/2011  . Knee pain 08/18/2011  . Acute torn meniscus 07/30/2011  . Old torn meniscus of knee 07/30/2011    . ARTHRITIS, LEFT KNEE 10/15/2010  . MEDIAL MENISCUS TEAR, RIGHT 10/15/2010  . HIP PAIN 01/15/2010  . DEGENERATIVE DISC DISEASE, LUMBOSACRAL SPINE W/RADICULOPATHY 01/15/2010  . PLICA SYNDROME [id number]/9476  . DERANGEMENT MENISCUS 07/09/2009  . JOINT EFFUSION, LEFT KNEE 07/09/2009  . KNEE, ARTHRITIS, DEGEN./OSTEO 01/30/2009  . KNEE PAIN 01/30/2009  . ANKLE SPRAIN, RIGHT 11/08/2007   Past Medical History:  Diagnosis Date  . Anxiety   . Arthritis   . Asthma   . Complication of anesthesia    pt had a hard time being able to move after spinal anesthesia , 3-4 hours  . Depression   . GERD (gastroesophageal reflux disease)   . Headache(784.0)    after surgery  . Heart murmur    Years ago- not now  . Hyperlipidemia   . PONV (postoperative nausea and vomiting)   . Sleep apnea    uses CIPAP machine at night    Family History  Problem Relation Age of Onset  . Diabetes Unknown   . Lung disease Unknown   . Arthritis Unknown   . Anesthesia problems Neg Hx   . Hypotension Neg Hx   . Malignant hyperthermia Neg Hx   . Pseudochol deficiency Neg Hx     Past Surgical History:  Procedure Laterality Date  . APPENDECTOMY    . BACK SURGERY     neck and back fusion  . BIOPSY  12/27/2015   Procedure: BIOPSY;  Surgeon: Rogene Houston, MD;  [facility];  Service: Endoscopy;;  Fundus biopsies and duodenal biopsies  . CARDIAC CATHETERIZATION    . CARDIAC CATHETERIZATION N/A 09/04/2016   Procedure: Right/Left Heart Cath and Coronary Angiography;  Surgeon: Peter M Martinique, MD;  Location: [facility];  Service: Cardiovascular;  Laterality: N/A;  . CHONDROPLASTY  08/15/2011   Procedure: CHONDROPLASTY;  Surgeon: [name], MD;  Location: [facility];  Service: Orthopedics;  Laterality: Left;  . COLONOSCOPY  06/27/2011   Procedure: COLONOSCOPY;  Surgeon: Rogene Houston, MD;  [facility];  Service: Endoscopy;  Laterality: N/A;  9:00 / Pt to be here at 9am for 10:45  procedure, benign polyps removed  . COLONOSCOPY N/A 10/05/2014   Procedure: COLONOSCOPY;  Surgeon: Rogene Houston, MD;  [facility];  Service: Endoscopy;  Laterality: N/A;  930  . COLONOSCOPY N/A 02/11/2018   Procedure: COLONOSCOPY;  Surgeon: Rogene Houston, MD;  [facility];  Service: Endoscopy;  Laterality: N/A;  830  . ESOPHAGOGASTRODUODENOSCOPY N/A 12/27/2015   Procedure: ESOPHAGOGASTRODUODENOSCOPY (EGD);  Surgeon: Rogene Houston, MD;  [facility];  Service: Endoscopy;  Laterality: N/A;  3:00  . HERNIA REPAIR     umbilical hernia  . KNEE ARTHROSCOPY     left knee  . KNEE ARTHROSCOPY     right knee   . LUMBAR LAMINECTOMY/DECOMPRESSION MICRODISCECTOMY  09/14/2012   Procedure: LUMBAR LAMINECTOMY/DECOMPRESSION MICRODISCECTOMY 1 LEVEL;  Surgeon: Floyce Stakes, MD;  Location:  [facility];  Service: Neurosurgery;  Laterality: Right;  Right Lumbar three-four Diskectomy  . neck fusion    . POLYPECTOMY  02/11/2018   Procedure: POLYPECTOMY;  Surgeon: Rogene Houston, MD;  [facility];  Service: Endoscopy;;  colon  . SHOULDER ARTHROSCOPY WITH BICEPSTENOTOMY Left 09/23/2013   Procedure: SHOULDER ARTHROSCOPY WITH BICEPSTENOTOMY AND EXTENSIVE DEBRIDEMENT;  Surgeon: Carole Civil, MD;  Location: [facility];  Service: Orthopedics;  Laterality: Left;  . SHOULDER ARTHROSCOPY WITH ROTATOR CUFF REPAIR Left 05/05/2014   Procedure: SHOULDER ARTHROSCOPY LIMITED DEBRIDEMENT;  Surgeon: Carole Civil, MD;  Location: [facility];  Service: Orthopedics;  Laterality: Left;  . SHOULDER OPEN ROTATOR CUFF REPAIR Left 05/05/2014   Procedure: ROTATOR CUFF REPAIR SHOULDER OPEN;  Surgeon: Carole Civil, MD;  Location: [facility];  Service: Orthopedics;  Laterality: Left;  . SHOULDER SURGERY Right    Open Mumford procedure  . SPINAL FUSION     x 2  . TOTAL KNEE ARTHROPLASTY Left 06/16/2017   Procedure: LEFT TOTAL KNEE ARTHROPLASTY;  Surgeon: Carole Civil, MD;   Location: [facility];  Service: Orthopedics;  Laterality: Left;   Social History   Occupational History  . 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 Smoking status: Current Every Day Smoker    Packs/day: 1.00    Years: 41.00    Pack years: 41.00    Types: Cigarettes  . Smokeless tobacco: Never Used  . Tobacco comment: smokes a pack a day. since age 25  Substance and Sexual Activity  . Alcohol use: No    Alcohol/week: 0.0 standard drinks  . Drug use: No  . Sexual activity: Not Currently

## 2018-07-01 ENCOUNTER — Other Ambulatory Visit: Payer: Self-pay | Admitting: Orthopedic Surgery

## 2018-07-01 DIAGNOSIS — Z96652 Presence of left artificial knee joint: Secondary | ICD-10-CM

## 2018-07-01 NOTE — Telephone Encounter (Signed)
Faxed request recevied from [facility] for medication refill: methocarbamol (ROBAXIN) 500 MG tablet / 1 tablet every 6 hours as needed.  Please advise.

## 2018-07-02 ENCOUNTER — Ambulatory Visit
Admission: RE | Admit: 2018-07-02 | Discharge: 2018-07-02 | Disposition: A | Discharge status: Home / Self Care | Payer: PPO | Source: Ambulatory Visit | Attending: Orthopedic Surgery | Admitting: Orthopedic Surgery

## 2018-07-02 DIAGNOSIS — M19019 Primary osteoarthritis, unspecified shoulder: Secondary | ICD-10-CM

## 2018-07-02 DIAGNOSIS — M19012 Primary osteoarthritis, left shoulder: Secondary | ICD-10-CM | POA: Diagnosis not present

## 2018-07-02 MED ORDER — METHOCARBAMOL 500 MG PO TABS: 500.0000 mg | ORAL_TABLET | Freq: Four times a day (QID) | ORAL | 1 refills | Status: DC | PRN

## 2018-07-12 ENCOUNTER — Telehealth (INDEPENDENT_AMBULATORY_CARE_PROVIDER_SITE_OTHER): Payer: Self-pay | Admitting: Orthopedic Surgery

## 2018-07-12 NOTE — Telephone Encounter (Signed)
Please advise. Thanks.  

## 2018-07-12 NOTE — Telephone Encounter (Signed)
I called and talked to both people.  In general he has partial-thickness subscap tear which could be repaired when we do the surgery.  Supraspinatus infraspinatus appear intact.  Plan for Biomet shoulder replacement with convertible glenoid.  Not too much in the way of glenoid wear is present which is good.  I discussed at length with him the risk and benefits of surgery before.  All questions answered.  Did reinforce to him the need to be very careful the first 3 to 4 months out after surgery in terms of doing excessive lifting.

## 2018-07-12 NOTE — Telephone Encounter (Signed)
Patient's wife Zelphia Cairo) called  Asked if Dr Marlou Sa can call with the CT results instead of them having to come into the office. She said she thought that patient would just be set up for the  Surgery after the CT scan. The number to contact Zelphia Cairo is [phone]

## 2018-07-15 ENCOUNTER — Ambulatory Visit (INDEPENDENT_AMBULATORY_CARE_PROVIDER_SITE_OTHER): Payer: Self-pay | Admitting: Orthopedic Surgery

## 2018-08-10 ENCOUNTER — Other Ambulatory Visit (INDEPENDENT_AMBULATORY_CARE_PROVIDER_SITE_OTHER): Payer: Self-pay | Admitting: Orthopedic Surgery

## 2018-08-10 DIAGNOSIS — M19012 Primary osteoarthritis, left shoulder: Secondary | ICD-10-CM

## 2018-08-12 ENCOUNTER — Encounter (HOSPITAL_COMMUNITY): Payer: Self-pay

## 2018-08-12 DIAGNOSIS — I10 Essential (primary) hypertension: Secondary | ICD-10-CM | POA: Diagnosis not present

## 2018-08-12 DIAGNOSIS — K210 Gastro-esophageal reflux disease with esophagitis: Secondary | ICD-10-CM | POA: Diagnosis not present

## 2018-08-12 DIAGNOSIS — Z79891 Long term (current) use of opiate analgesic: Secondary | ICD-10-CM | POA: Diagnosis not present

## 2018-08-12 DIAGNOSIS — J449 Chronic obstructive pulmonary disease, unspecified: Secondary | ICD-10-CM | POA: Diagnosis not present

## 2018-08-12 DIAGNOSIS — F419 Anxiety disorder, unspecified: Secondary | ICD-10-CM | POA: Diagnosis not present

## 2018-08-12 NOTE — Pre-Procedure Instructions (Signed)
DEAUNTE DENTE  08/12/2018      [facility] [place], [place] [address] Phone: [phone] Fax: [phone]    Your procedure is scheduled on Monday December 23.  Report to [facility] Admitting at 5:30 A.M.  Call this number if you have problems the morning of surgery:  [phone]   Remember:  Do not eat or drink after midnight.    Take these medicines the morning of surgery with A SIP OF WATER:   Pantoprazole (protonix) Sertraline (Zoloft) Proair inhaler if needed (please bring to hospital with you) Hydrocodone-acetaminophen [facility]) if needed    Do not wear jewelry  Do not wear lotions, powders, or colognes, or deodorant.  Do not shave 48 hours prior to surgery.  Men may shave face and neck.  Do not bring valuables to the hospital.  [facility] is not responsible for any belongings or valuables.  Contacts, dentures or bridgework may not be worn into surgery.  Leave your suitcase in the car.  After surgery it may be brought to your room.  For patients admitted to the hospital, discharge time will be determined by your treatment team.  Patients discharged the day of surgery will not be allowed to drive home.   Special instructions:   - Preparing For Surgery  Before surgery, you can play an important role. Because skin is not sterile, your skin needs to be as free of germs as possible. You can reduce the number of germs on your skin by washing with CHG (chlorahexidine gluconate) Soap before surgery.  CHG is an antiseptic cleaner which kills germs and bonds with the skin to continue killing germs even after washing.    Oral Hygiene is also important to reduce your risk of infection.  Remember - BRUSH YOUR TEETH THE MORNING OF SURGERY WITH YOUR REGULAR TOOTHPASTE  Please do not use if you have an allergy to CHG or antibacterial soaps. If your skin becomes reddened/irritated stop using the  CHG.  Do not shave (including legs and underarms) for at least 48 hours prior to first CHG shower. It is OK to shave your face.  Please follow these instructions carefully.   1. Shower the NIGHT BEFORE SURGERY and the MORNING OF SURGERY with CHG.   2. If you chose to wash your hair, wash your hair first as usual with your normal shampoo.  3. After you shampoo, rinse your hair and body thoroughly to remove the shampoo.  4. Use CHG as you would any other liquid soap. You can apply CHG directly to the skin and wash gently with a scrungie or a clean washcloth.   5. Apply the CHG Soap to your body ONLY FROM THE NECK DOWN.  Do not use on open wounds or open sores. Avoid contact with your eyes, ears, mouth and genitals (private parts). Wash Face and genitals (private parts)  with your normal soap.  6. Wash thoroughly, paying special attention to the area where your surgery will be performed.  7. Thoroughly rinse your body with warm water from the neck down.  8. DO NOT shower/wash with your normal soap after using and rinsing off the CHG Soap.  9. Pat yourself dry with a CLEAN TOWEL.  10. Wear CLEAN PAJAMAS to bed the night before surgery, wear comfortable clothes the morning of surgery  11. Place CLEAN SHEETS on your bed the night of your first  shower and DO NOT SLEEP WITH PETS.    Day of Surgery:  Do not apply any deodorants/lotions.  Please wear clean clothes to the hospital/surgery center.   Remember to brush your teeth WITH YOUR REGULAR TOOTHPASTE.    Please read over the following fact sheets that you were given. Coughing and Deep Breathing, MRSA Information and Surgical Site Infection Prevention

## 2018-08-13 ENCOUNTER — Inpatient Hospital Stay (HOSPITAL_COMMUNITY)
Admission: RE | Admit: 2018-08-13 | Discharge: 2018-08-13 | Disposition: A | Discharge status: Home / Self Care | Payer: PPO | Source: Ambulatory Visit

## 2018-08-16 NOTE — Pre-Procedure Instructions (Signed)
Christopher [name]  08/16/2018      [facility] - [place], [place] [address] Phone: [phone] Fax: [phone]    Your procedure is scheduled on Mon., Dec. 23, 2019 from 7:30AM-12:00PM  Report to [facility] Admitting Entrance "A" at 5:30AM  Call this number if you have problems the morning of surgery:  [phone]   Remember:  Do not eat or drink after midnight on Dec. 22nd    Take these medicines the morning of surgery with A SIP OF WATER: Dicyclomine (BENTYL), HYDROcodone-acetaminophen (NORCO), Pantoprazole (PROTONIX), and Sertraline (ZOLOFT)   If needed: ALPRAZolam Duanne Moron), Methocarbamol (ROBAXIN), NitroGLYCERIN (NITROSTAT), and PROAIR Inhaler-bring with you the day of surgery  As of today, stop taking all Other Aspirin Products, Vitamins, Fish oils, and Herbal medications. Also stop all NSAIDS i.e. Advil, Ibuprofen, Motrin, Aleve, Anaprox, Naproxen, BC, Goody Powders, and all Supplements.   Do not wear jewelry.  Do not wear lotions, powders, colognes, or deodorant.  Do not shave 48 hours prior to surgery.  Men may shave face.  Do not bring valuables to the hospital.  [facility] is not responsible for any belongings or valuables.  Contacts, dentures or bridgework may not be worn into surgery.  Leave your suitcase in the car.  After surgery it may be brought to your room.  For patients admitted to the hospital, discharge time will be determined by your treatment team.  Patients discharged the day of surgery will not be allowed to drive home.   Special instructions:   [place]- Preparing For Surgery  Before surgery, you can play an important role. Because skin is not sterile, your skin needs to be as free of germs as possible. You can reduce the number of germs on your skin by washing with CHG (chlorahexidine gluconate) Soap before surgery.  CHG is an antiseptic cleaner which kills germs and bonds  with the skin to continue killing germs even after washing.    Oral Hygiene is also important to reduce your risk of infection.  Remember - BRUSH YOUR TEETH THE MORNING OF SURGERY WITH YOUR REGULAR TOOTHPASTE  Please do not use if you have an allergy to CHG or antibacterial soaps. If your skin becomes reddened/irritated stop using the CHG.  Do not shave (including legs and underarms) for at least 48 hours prior to first CHG shower. It is OK to shave your face.  Please follow these instructions carefully.   1. Shower the NIGHT BEFORE SURGERY and the MORNING OF SURGERY with CHG.   2. If you chose to wash your hair, wash your hair first as usual with your normal shampoo.  3. After you shampoo, rinse your hair and body thoroughly to remove the shampoo.  4. Use CHG as you would any other liquid soap. You can apply CHG directly to the skin and wash gently with a scrungie or a clean washcloth.   5. Apply the CHG Soap to your body ONLY FROM THE NECK DOWN.  Do not use on open wounds or open sores. Avoid contact with your eyes, ears, mouth and genitals (private parts). Wash Face and genitals (private parts)  with your normal soap.  6. Wash thoroughly, paying special attention to the area where your surgery will be performed.  7. Thoroughly rinse your body with warm water from the neck down.  8. DO NOT shower/wash with your normal soap after using and rinsing  off the CHG Soap.  9. Pat yourself dry with a CLEAN TOWEL.  10. Wear CLEAN PAJAMAS to bed the night before surgery, wear comfortable clothes the morning of surgery  11. Place CLEAN SHEETS on your bed the night of your first shower and DO NOT SLEEP WITH PETS.  Day of Surgery:  Do not apply any deodorants/lotions.  Please wear clean clothes to the hospital/surgery center.   Remember to brush your teeth WITH YOUR REGULAR TOOTHPASTE.  Please read over the following fact sheets that you were given. Pain Booklet, Coughing and Deep  Breathing, MRSA Information and Surgical Site Infection Prevention

## 2018-08-17 ENCOUNTER — Encounter (HOSPITAL_COMMUNITY)
Admission: RE | Admit: 2018-08-17 | Discharge: 2018-08-17 | Disposition: A | Discharge status: Home / Self Care | Payer: PPO | Source: Ambulatory Visit | Attending: Orthopedic Surgery | Admitting: Orthopedic Surgery

## 2018-08-17 ENCOUNTER — Encounter (HOSPITAL_COMMUNITY): Payer: Self-pay

## 2018-08-17 ENCOUNTER — Other Ambulatory Visit: Payer: Self-pay

## 2018-08-17 DIAGNOSIS — F329 Major depressive disorder, single episode, unspecified: Secondary | ICD-10-CM | POA: Diagnosis not present

## 2018-08-17 DIAGNOSIS — M19012 Primary osteoarthritis, left shoulder: Secondary | ICD-10-CM | POA: Diagnosis not present

## 2018-08-17 DIAGNOSIS — R001 Bradycardia, unspecified: Secondary | ICD-10-CM

## 2018-08-17 DIAGNOSIS — Z01818 Encounter for other preprocedural examination: Secondary | ICD-10-CM | POA: Insufficient documentation

## 2018-08-17 DIAGNOSIS — Z9989 Dependence on other enabling machines and devices: Secondary | ICD-10-CM | POA: Diagnosis not present

## 2018-08-17 DIAGNOSIS — F17210 Nicotine dependence, cigarettes, uncomplicated: Secondary | ICD-10-CM | POA: Diagnosis not present

## 2018-08-17 DIAGNOSIS — Z9049 Acquired absence of other specified parts of digestive tract: Secondary | ICD-10-CM | POA: Diagnosis not present

## 2018-08-17 DIAGNOSIS — J45909 Unspecified asthma, uncomplicated: Secondary | ICD-10-CM | POA: Diagnosis not present

## 2018-08-17 DIAGNOSIS — Z981 Arthrodesis status: Secondary | ICD-10-CM | POA: Diagnosis not present

## 2018-08-17 DIAGNOSIS — K219 Gastro-esophageal reflux disease without esophagitis: Secondary | ICD-10-CM | POA: Diagnosis not present

## 2018-08-17 DIAGNOSIS — Z96652 Presence of left artificial knee joint: Secondary | ICD-10-CM | POA: Diagnosis not present

## 2018-08-17 DIAGNOSIS — E119 Type 2 diabetes mellitus without complications: Secondary | ICD-10-CM | POA: Diagnosis not present

## 2018-08-17 DIAGNOSIS — G4730 Sleep apnea, unspecified: Secondary | ICD-10-CM | POA: Diagnosis not present

## 2018-08-17 DIAGNOSIS — E785 Hyperlipidemia, unspecified: Secondary | ICD-10-CM | POA: Diagnosis not present

## 2018-08-17 HISTORY — DX: Irritable bowel syndrome, unspecified: K58.9

## 2018-08-17 LAB — URINALYSIS, ROUTINE W REFLEX MICROSCOPIC
BILIRUBIN URINE: NEGATIVE
Glucose, UA: NEGATIVE mg/dL
Hgb urine dipstick: NEGATIVE
Ketones, ur: NEGATIVE mg/dL
Leukocytes, UA: NEGATIVE
Nitrite: NEGATIVE
Protein, ur: NEGATIVE mg/dL
Specific Gravity, Urine: 1.019 (ref 1.005–1.030)
pH: 6 (ref 5.0–8.0)

## 2018-08-17 LAB — CBC
HCT: 44.8 % (ref 39.0–52.0)
Hemoglobin: 14.3 g/dL (ref 13.0–17.0)
MCH: 28.2 pg (ref 26.0–34.0)
MCHC: 31.9 g/dL (ref 30.0–36.0)
MCV: 88.4 fL (ref 80.0–100.0)
Platelets: 212 10*3/uL (ref 150–400)
RBC: 5.07 MIL/uL (ref 4.22–5.81)
RDW: 13.8 % (ref 11.5–15.5)
WBC: 10.4 10*3/uL (ref 4.0–10.5)
nRBC: 0 % (ref 0.0–0.2)

## 2018-08-17 LAB — BASIC METABOLIC PANEL
Anion gap: 9 (ref 5–15)
BUN: 19 mg/dL (ref 6–20)
CO2: 27 mmol/L (ref 22–32)
CREATININE: 1.04 mg/dL (ref 0.61–1.24)
Calcium: 9.3 mg/dL (ref 8.9–10.3)
Chloride: 104 mmol/L (ref 98–111)
GFR calc Af Amer: >60 mL/min (ref >60)
GFR calc non Af Amer: >60 mL/min (ref >60)
Glucose, Bld: 114 mg/dL — ABNORMAL HIGH (ref 70–99)
Potassium: 4.5 mmol/L (ref 3.5–5.1)
SODIUM: 140 mmol/L (ref 135–145)

## 2018-08-17 LAB — SURGICAL PCR SCREEN
MRSA, PCR: NEGATIVE
Staphylococcus aureus: NEGATIVE

## 2018-08-17 NOTE — Progress Notes (Signed)
PCP - Dr. Sinda Du Cardiologist - denies  Chest x-ray - N/A EKG - 08/17/18 Stress Test - 2003 ECHO - denies Cardiac Cath - 09/04/16  Sleep Study - OSA positive; uses CPAP nightly. Instructed to bring mask to hospital on DOS.   Aspirin Instructions: N/A  Anesthesia review: No  Patient denies shortness of breath, fever, cough and chest pain at PAT appointment   Patient verbalized understanding of instructions that were given to them at the PAT appointment. Patient was also instructed that they will need to review over the PAT instructions again at home before surgery.

## 2018-08-18 LAB — URINE CULTURE: Culture: <10000 — AB

## 2018-08-19 ENCOUNTER — Inpatient Hospital Stay (HOSPITAL_COMMUNITY): Payer: PPO | Admitting: Certified Registered Nurse Anesthetist

## 2018-08-19 ENCOUNTER — Other Ambulatory Visit: Payer: Self-pay

## 2018-08-19 ENCOUNTER — Encounter (HOSPITAL_COMMUNITY)
Admission: RE | Disposition: A | Discharge status: Home / Self Care | Payer: Self-pay | Source: Home / Self Care | Attending: Orthopedic Surgery

## 2018-08-19 ENCOUNTER — Inpatient Hospital Stay (HOSPITAL_COMMUNITY): Payer: PPO

## 2018-08-19 ENCOUNTER — Inpatient Hospital Stay (HOSPITAL_COMMUNITY)
Admission: RE | Admit: 2018-08-19 | Discharge: 2018-08-20 | DRG: 483 | Disposition: A | Discharge status: Home / Self Care | Payer: PPO | Attending: Orthopedic Surgery | Admitting: Orthopedic Surgery

## 2018-08-19 ENCOUNTER — Encounter (HOSPITAL_COMMUNITY): Payer: Self-pay | Admitting: Certified Registered Nurse Anesthetist

## 2018-08-19 DIAGNOSIS — Z96652 Presence of left artificial knee joint: Secondary | ICD-10-CM | POA: Diagnosis not present

## 2018-08-19 DIAGNOSIS — Z888 Allergy status to other drugs, medicaments and biological substances status: Secondary | ICD-10-CM

## 2018-08-19 DIAGNOSIS — E119 Type 2 diabetes mellitus without complications: Secondary | ICD-10-CM | POA: Diagnosis not present

## 2018-08-19 DIAGNOSIS — Z791 Long term (current) use of non-steroidal anti-inflammatories (NSAID): Secondary | ICD-10-CM | POA: Diagnosis not present

## 2018-08-19 DIAGNOSIS — Z881 Allergy status to other antibiotic agents status: Secondary | ICD-10-CM

## 2018-08-19 DIAGNOSIS — Z96611 Presence of right artificial shoulder joint: Secondary | ICD-10-CM | POA: Diagnosis not present

## 2018-08-19 DIAGNOSIS — M19011 Primary osteoarthritis, right shoulder: Secondary | ICD-10-CM | POA: Diagnosis not present

## 2018-08-19 DIAGNOSIS — E785 Hyperlipidemia, unspecified: Secondary | ICD-10-CM | POA: Diagnosis not present

## 2018-08-19 DIAGNOSIS — Z9049 Acquired absence of other specified parts of digestive tract: Secondary | ICD-10-CM

## 2018-08-19 DIAGNOSIS — G4730 Sleep apnea, unspecified: Secondary | ICD-10-CM | POA: Diagnosis present

## 2018-08-19 DIAGNOSIS — J45909 Unspecified asthma, uncomplicated: Secondary | ICD-10-CM | POA: Diagnosis not present

## 2018-08-19 DIAGNOSIS — K219 Gastro-esophageal reflux disease without esophagitis: Secondary | ICD-10-CM | POA: Diagnosis not present

## 2018-08-19 DIAGNOSIS — Z885 Allergy status to narcotic agent status: Secondary | ICD-10-CM | POA: Diagnosis not present

## 2018-08-19 DIAGNOSIS — Z9989 Dependence on other enabling machines and devices: Secondary | ICD-10-CM | POA: Diagnosis not present

## 2018-08-19 DIAGNOSIS — Z833 Family history of diabetes mellitus: Secondary | ICD-10-CM

## 2018-08-19 DIAGNOSIS — Z981 Arthrodesis status: Secondary | ICD-10-CM

## 2018-08-19 DIAGNOSIS — M19012 Primary osteoarthritis, left shoulder: Secondary | ICD-10-CM | POA: Diagnosis not present

## 2018-08-19 DIAGNOSIS — Z79899 Other long term (current) drug therapy: Secondary | ICD-10-CM

## 2018-08-19 DIAGNOSIS — F17210 Nicotine dependence, cigarettes, uncomplicated: Secondary | ICD-10-CM | POA: Diagnosis present

## 2018-08-19 DIAGNOSIS — Z79891 Long term (current) use of opiate analgesic: Secondary | ICD-10-CM

## 2018-08-19 DIAGNOSIS — G8918 Other acute postprocedural pain: Secondary | ICD-10-CM | POA: Diagnosis not present

## 2018-08-19 DIAGNOSIS — M19019 Primary osteoarthritis, unspecified shoulder: Secondary | ICD-10-CM | POA: Diagnosis present

## 2018-08-19 DIAGNOSIS — Z471 Aftercare following joint replacement surgery: Secondary | ICD-10-CM | POA: Diagnosis not present

## 2018-08-19 DIAGNOSIS — F329 Major depressive disorder, single episode, unspecified: Secondary | ICD-10-CM | POA: Diagnosis present

## 2018-08-19 DIAGNOSIS — J449 Chronic obstructive pulmonary disease, unspecified: Secondary | ICD-10-CM | POA: Diagnosis not present

## 2018-08-19 DIAGNOSIS — Z96612 Presence of left artificial shoulder joint: Secondary | ICD-10-CM | POA: Diagnosis not present

## 2018-08-19 HISTORY — PX: TOTAL SHOULDER ARTHROPLASTY: SHX126

## 2018-08-19 SURGERY — ARTHROPLASTY, SHOULDER, TOTAL
Anesthesia: General | Laterality: Left

## 2018-08-19 MED ORDER — VANCOMYCIN HCL 1000 MG IV SOLR
INTRAVENOUS | Status: DC | PRN
  Administered 2018-08-19: 1000 mg

## 2018-08-19 MED ORDER — GABAPENTIN 300 MG PO CAPS
300.0000 mg | ORAL_CAPSULE | Freq: Three times a day (TID) | ORAL | Status: DC
  Administered 2018-08-19 – 2018-08-20 (×3): 300 mg via ORAL
  Filled 2018-08-19 (×2): qty 1

## 2018-08-19 MED ORDER — FENTANYL CITRATE (PF) 100 MCG/2ML IJ SOLN
100.0000 ug | Freq: Once | INTRAMUSCULAR | Status: AC
  Administered 2018-08-19: 100 ug via INTRAVENOUS

## 2018-08-19 MED ORDER — BUPIVACAINE HCL (PF) 0.5 % IJ SOLN
INTRAMUSCULAR | Status: DC | PRN
  Administered 2018-08-19: 15 mL via PERINEURAL

## 2018-08-19 MED ORDER — FENTANYL CITRATE (PF) 250 MCG/5ML IJ SOLN
INTRAMUSCULAR | Status: AC
  Filled 2018-08-19: qty 5

## 2018-08-19 MED ORDER — MIDAZOLAM HCL 2 MG/2ML IJ SOLN
INTRAMUSCULAR | Status: AC
  Filled 2018-08-19: qty 2

## 2018-08-19 MED ORDER — SUGAMMADEX SODIUM 200 MG/2ML IV SOLN
INTRAVENOUS | Status: DC | PRN
  Administered 2018-08-19: 200 mg via INTRAVENOUS

## 2018-08-19 MED ORDER — SCOPOLAMINE 1 MG/3DAYS TD PT72
MEDICATED_PATCH | TRANSDERMAL | Status: AC
  Filled 2018-08-19: qty 1

## 2018-08-19 MED ORDER — PHENOL 1.4 % MT LIQD: 1.0000 | OROMUCOSAL | Status: DC | PRN

## 2018-08-19 MED ORDER — MEPERIDINE HCL 50 MG/ML IJ SOLN: 6.2500 mg | INTRAMUSCULAR | Status: DC | PRN

## 2018-08-19 MED ORDER — HYDROMORPHONE HCL 1 MG/ML IJ SOLN
0.5000 mg | INTRAMUSCULAR | Status: DC | PRN
  Administered 2018-08-19: 0.5 mg via INTRAVENOUS
  Filled 2018-08-19: qty 1

## 2018-08-19 MED ORDER — GLYCOPYRROLATE 0.2 MG/ML IJ SOLN
INTRAMUSCULAR | Status: DC | PRN
  Administered 2018-08-19 (×2): 0.1 mg via INTRAVENOUS

## 2018-08-19 MED ORDER — ALPRAZOLAM 0.5 MG PO TABS: 0.5000 mg | ORAL_TABLET | Freq: Three times a day (TID) | ORAL | Status: DC | PRN

## 2018-08-19 MED ORDER — METHOCARBAMOL 1000 MG/10ML IJ SOLN
500.0000 mg | Freq: Four times a day (QID) | INTRAVENOUS | Status: DC | PRN
  Filled 2018-08-19: qty 5

## 2018-08-19 MED ORDER — PROPOFOL 10 MG/ML IV BOLUS
INTRAVENOUS | Status: DC | PRN
  Administered 2018-08-19: 180 mg via INTRAVENOUS

## 2018-08-19 MED ORDER — CHLORHEXIDINE GLUCONATE 4 % EX LIQD: 60.0000 mL | Freq: Once | CUTANEOUS | Status: DC

## 2018-08-19 MED ORDER — 0.9 % SODIUM CHLORIDE (POUR BTL) OPTIME
TOPICAL | Status: DC | PRN
  Administered 2018-08-19 (×6): 1000 mL

## 2018-08-19 MED ORDER — ASPIRIN EC 81 MG PO TBEC
81.0000 mg | DELAYED_RELEASE_TABLET | Freq: Two times a day (BID) | ORAL | Status: DC
  Administered 2018-08-19 – 2018-08-20 (×2): 81 mg via ORAL
  Filled 2018-08-19 (×2): qty 1

## 2018-08-19 MED ORDER — PHENYLEPHRINE 40 MCG/ML (10ML) SYRINGE FOR IV PUSH (FOR BLOOD PRESSURE SUPPORT)
PREFILLED_SYRINGE | INTRAVENOUS | Status: AC
  Filled 2018-08-19: qty 10

## 2018-08-19 MED ORDER — SCOPOLAMINE 1 MG/3DAYS TD PT72
MEDICATED_PATCH | TRANSDERMAL | Status: DC | PRN
  Administered 2018-08-19: 1 via TRANSDERMAL

## 2018-08-19 MED ORDER — METHOCARBAMOL 500 MG PO TABS
500.0000 mg | ORAL_TABLET | Freq: Four times a day (QID) | ORAL | Status: DC | PRN
  Administered 2018-08-19 – 2018-08-20 (×2): 500 mg via ORAL
  Filled 2018-08-19 (×2): qty 1

## 2018-08-19 MED ORDER — BUPIVACAINE LIPOSOME 1.3 % IJ SUSP
INTRAMUSCULAR | Status: DC | PRN
  Administered 2018-08-19: 10 mL via PERINEURAL

## 2018-08-19 MED ORDER — PROPOFOL 10 MG/ML IV BOLUS
INTRAVENOUS | Status: AC
  Filled 2018-08-19: qty 20

## 2018-08-19 MED ORDER — CEFAZOLIN SODIUM-DEXTROSE 2-4 GM/100ML-% IV SOLN
2.0000 g | INTRAVENOUS | Status: AC
  Administered 2018-08-19: 2 g via INTRAVENOUS
  Filled 2018-08-19: qty 100

## 2018-08-19 MED ORDER — FENTANYL CITRATE (PF) 100 MCG/2ML IJ SOLN
INTRAMUSCULAR | Status: AC
  Filled 2018-08-19: qty 2

## 2018-08-19 MED ORDER — LIDOCAINE 2% (20 MG/ML) 5 ML SYRINGE
INTRAMUSCULAR | Status: DC | PRN
  Administered 2018-08-19: 60 mg via INTRAVENOUS

## 2018-08-19 MED ORDER — LACTATED RINGERS IV SOLN
INTRAVENOUS | Status: DC
  Administered 2018-08-19 (×2): via INTRAVENOUS

## 2018-08-19 MED ORDER — ONDANSETRON HCL 4 MG PO TABS: 4.0000 mg | ORAL_TABLET | Freq: Four times a day (QID) | ORAL | Status: DC | PRN

## 2018-08-19 MED ORDER — ONDANSETRON HCL 4 MG/2ML IJ SOLN
INTRAMUSCULAR | Status: DC | PRN
  Administered 2018-08-19: 4 mg via INTRAVENOUS

## 2018-08-19 MED ORDER — ONDANSETRON HCL 4 MG/2ML IJ SOLN
4.0000 mg | Freq: Four times a day (QID) | INTRAMUSCULAR | Status: DC | PRN
  Administered 2018-08-19: 4 mg via INTRAVENOUS

## 2018-08-19 MED ORDER — LACTATED RINGERS IV SOLN
INTRAVENOUS | Status: DC
  Administered 2018-08-19: 18:00:00 via INTRAVENOUS

## 2018-08-19 MED ORDER — SERTRALINE HCL 50 MG PO TABS: 50.0000 mg | ORAL_TABLET | Freq: Every day | ORAL | Status: DC

## 2018-08-19 MED ORDER — METOCLOPRAMIDE HCL 5 MG PO TABS: 5.0000 mg | ORAL_TABLET | Freq: Three times a day (TID) | ORAL | Status: DC | PRN

## 2018-08-19 MED ORDER — PANTOPRAZOLE SODIUM 40 MG PO TBEC: 40.0000 mg | DELAYED_RELEASE_TABLET | Freq: Every day | ORAL | Status: DC

## 2018-08-19 MED ORDER — MENTHOL 3 MG MT LOZG
1.0000 | LOZENGE | OROMUCOSAL | Status: DC | PRN
  Administered 2018-08-19: 3 mg via ORAL
  Filled 2018-08-19: qty 9

## 2018-08-19 MED ORDER — ONDANSETRON HCL 4 MG/2ML IJ SOLN
INTRAMUSCULAR | Status: AC
  Filled 2018-08-19: qty 2

## 2018-08-19 MED ORDER — FENTANYL CITRATE (PF) 100 MCG/2ML IJ SOLN
INTRAMUSCULAR | Status: DC | PRN
  Administered 2018-08-19: 25 ug via INTRAVENOUS
  Administered 2018-08-19 (×2): 50 ug via INTRAVENOUS
  Administered 2018-08-19: 75 ug via INTRAVENOUS

## 2018-08-19 MED ORDER — ALBUTEROL SULFATE (2.5 MG/3ML) 0.083% IN NEBU: 3.0000 mL | INHALATION_SOLUTION | Freq: Four times a day (QID) | RESPIRATORY_TRACT | Status: DC | PRN

## 2018-08-19 MED ORDER — NITROGLYCERIN 0.4 MG SL SUBL: 0.4000 mg | SUBLINGUAL_TABLET | SUBLINGUAL | Status: DC | PRN

## 2018-08-19 MED ORDER — LACTATED RINGERS IV SOLN
INTRAVENOUS | Status: DC
  Administered 2018-08-19: 17:00:00 via INTRAVENOUS

## 2018-08-19 MED ORDER — MIDAZOLAM HCL 2 MG/2ML IJ SOLN
2.0000 mg | Freq: Once | INTRAMUSCULAR | Status: AC
  Administered 2018-08-19: 2 mg via INTRAVENOUS

## 2018-08-19 MED ORDER — DIPHENHYDRAMINE HCL 50 MG/ML IJ SOLN
INTRAMUSCULAR | Status: DC | PRN
  Administered 2018-08-19: 12.5 mg via INTRAVENOUS

## 2018-08-19 MED ORDER — BUPIVACAINE HCL (PF) 0.5 % IJ SOLN
INTRAMUSCULAR | Status: AC
  Filled 2018-08-19: qty 30

## 2018-08-19 MED ORDER — DICYCLOMINE HCL 10 MG PO CAPS
10.0000 mg | ORAL_CAPSULE | ORAL | Status: DC
  Administered 2018-08-20: 10 mg via ORAL
  Filled 2018-08-19: qty 1

## 2018-08-19 MED ORDER — FENTANYL CITRATE (PF) 100 MCG/2ML IJ SOLN
25.0000 ug | INTRAMUSCULAR | Status: DC | PRN
  Administered 2018-08-19 (×2): 50 ug via INTRAVENOUS

## 2018-08-19 MED ORDER — VANCOMYCIN HCL IN DEXTROSE 1-5 GM/200ML-% IV SOLN
INTRAVENOUS | Status: AC
  Administered 2018-08-19: 1000 mg via INTRAVENOUS
  Filled 2018-08-19: qty 200

## 2018-08-19 MED ORDER — VANCOMYCIN HCL IN DEXTROSE 1-5 GM/200ML-% IV SOLN
1000.0000 mg | Freq: Once | INTRAVENOUS | Status: AC
  Administered 2018-08-19: 1000 mg via INTRAVENOUS

## 2018-08-19 MED ORDER — ROCURONIUM BROMIDE 50 MG/5ML IV SOSY
PREFILLED_SYRINGE | INTRAVENOUS | Status: DC | PRN
  Administered 2018-08-19 (×2): 50 mg via INTRAVENOUS

## 2018-08-19 MED ORDER — MIDAZOLAM HCL 2 MG/2ML IJ SOLN
INTRAMUSCULAR | Status: AC
  Administered 2018-08-19: 2 mg via INTRAVENOUS
  Filled 2018-08-19: qty 2

## 2018-08-19 MED ORDER — METOCLOPRAMIDE HCL 5 MG/ML IJ SOLN: 5.0000 mg | Freq: Three times a day (TID) | INTRAMUSCULAR | Status: DC | PRN

## 2018-08-19 MED ORDER — SODIUM CHLORIDE 0.9 % IV SOLN
INTRAVENOUS | Status: DC | PRN
  Administered 2018-08-19: 50 ug/min via INTRAVENOUS

## 2018-08-19 MED ORDER — FENTANYL CITRATE (PF) 100 MCG/2ML IJ SOLN
INTRAMUSCULAR | Status: AC
  Administered 2018-08-19: 100 ug via INTRAVENOUS
  Filled 2018-08-19: qty 2

## 2018-08-19 MED ORDER — DOCUSATE SODIUM 100 MG PO CAPS
100.0000 mg | ORAL_CAPSULE | Freq: Two times a day (BID) | ORAL | Status: DC
  Administered 2018-08-19 – 2018-08-20 (×2): 100 mg via ORAL
  Filled 2018-08-19 (×2): qty 1

## 2018-08-19 MED ORDER — HYDROCODONE-ACETAMINOPHEN 10-325 MG PO TABS
1.0000 | ORAL_TABLET | ORAL | Status: DC | PRN
  Administered 2018-08-19 – 2018-08-20 (×4): 1 via ORAL
  Filled 2018-08-19 (×5): qty 1

## 2018-08-19 MED ORDER — METOCLOPRAMIDE HCL 5 MG/ML IJ SOLN: 10.0000 mg | Freq: Once | INTRAMUSCULAR | Status: DC | PRN

## 2018-08-19 MED ORDER — VANCOMYCIN HCL 1000 MG IV SOLR
INTRAVENOUS | Status: AC
  Filled 2018-08-19: qty 1000

## 2018-08-19 MED ORDER — ACETAMINOPHEN 325 MG PO TABS: 325.0000 mg | ORAL_TABLET | Freq: Four times a day (QID) | ORAL | Status: DC | PRN

## 2018-08-19 MED ORDER — VANCOMYCIN HCL IN DEXTROSE 1-5 GM/200ML-% IV SOLN
1000.0000 mg | Freq: Two times a day (BID) | INTRAVENOUS | Status: AC
  Administered 2018-08-19: 1000 mg via INTRAVENOUS
  Filled 2018-08-19: qty 200

## 2018-08-19 MED ORDER — TEMAZEPAM 15 MG PO CAPS
30.0000 mg | ORAL_CAPSULE | Freq: Every day | ORAL | Status: DC
  Administered 2018-08-19: 30 mg via ORAL
  Filled 2018-08-19: qty 2

## 2018-08-19 MED ORDER — PHENYLEPHRINE HCL 10 MG/ML IJ SOLN
INTRAMUSCULAR | Status: AC
  Filled 2018-08-19: qty 1

## 2018-08-19 SURGICAL SUPPLY — 77 items
BASEPLATE GLENOID FIX SHOULDER (Shoulder) ×3 IMPLANT
BIT DRILL TWIST 2.7 (BIT) ×2 IMPLANT
BIT DRILL TWIST 2.7MM (BIT) ×1
BLADE SAW SGTL 13X75X1.27 (BLADE) ×3 IMPLANT
CHLORAPREP W/TINT 26ML (MISCELLANEOUS) ×6 IMPLANT
CLOSURE WOUND 1/2 X4 (GAUZE/BANDAGES/DRESSINGS) ×1
COVER SURGICAL LIGHT HANDLE (MISCELLANEOUS) ×3 IMPLANT
COVER WAND RF STERILE (DRAPES) ×3 IMPLANT
DRAPE INCISE IOBAN 66X45 STRL (DRAPES) ×3 IMPLANT
DRAPE ORTHO SPLIT 77X108 STRL (DRAPES) ×2
DRAPE SURG ORHT 6 SPLT 77X108 (DRAPES) ×1 IMPLANT
DRAPE U-SHAPE 47X51 STRL (DRAPES) ×6 IMPLANT
DRSG AQUACEL AG ADV 3.5X10 (GAUZE/BANDAGES/DRESSINGS) ×3 IMPLANT
ELECT BLADE 4.0 EZ CLEAN MEGAD (MISCELLANEOUS)
ELECT REM PT RETURN 9FT ADLT (ELECTROSURGICAL) ×3
ELECTRODE BLDE 4.0 EZ CLN MEGD (MISCELLANEOUS) IMPLANT
ELECTRODE REM PT RTRN 9FT ADLT (ELECTROSURGICAL) ×1 IMPLANT
GAUZE SPONGE 4X4 12PLY STRL LF (GAUZE/BANDAGES/DRESSINGS) ×3 IMPLANT
GLOVE BIOGEL PI IND STRL 7.5 (GLOVE) ×1 IMPLANT
GLOVE BIOGEL PI IND STRL 8 (GLOVE) ×1 IMPLANT
GLOVE BIOGEL PI INDICATOR 7.5 (GLOVE) ×2
GLOVE BIOGEL PI INDICATOR 8 (GLOVE) ×2
GLOVE ECLIPSE 7.0 STRL STRAW (GLOVE) ×3 IMPLANT
GLOVE SURG ORTHO 8.0 STRL STRW (GLOVE) ×3 IMPLANT
GOWN STRL REUS W/ TWL LRG LVL3 (GOWN DISPOSABLE) ×2 IMPLANT
GOWN STRL REUS W/ TWL XL LVL3 (GOWN DISPOSABLE) ×2 IMPLANT
GOWN STRL REUS W/TWL LRG LVL3 (GOWN DISPOSABLE) ×4
GOWN STRL REUS W/TWL XL LVL3 (GOWN DISPOSABLE) ×4
HEAD HUMERAL BIPOLAR 47X24X46 (Miscellaneous) ×1 IMPLANT
HEAD HUMERAL COMP STD (Orthopedic Implant) ×1 IMPLANT
HUMERAL HEAD BIPOLAR 47X24X46 (Miscellaneous) ×3 IMPLANT
HUMERAL HEAD COMP STD (Orthopedic Implant) ×3 IMPLANT
KIT BASIN OR (CUSTOM PROCEDURE TRAY) ×3 IMPLANT
KIT TURNOVER KIT B (KITS) ×3 IMPLANT
LINER GLENOID CONV E1 (Liner) ×3 IMPLANT
MANIFOLD NEPTUNE II (INSTRUMENTS) ×3 IMPLANT
MODEL GLENOID TOTAL SIGNATURE (SYSTAGENIX WOUND MANAGEMENT) ×3 IMPLANT
NDL SUT 6 .5 CRC .975X.05 MAYO (NEEDLE) ×1 IMPLANT
NEEDLE HYPO 25GX1X1/2 BEV (NEEDLE) IMPLANT
NEEDLE MAYO TAPER (NEEDLE) ×2
NS IRRIG 1000ML POUR BTL (IV SOLUTION) ×21 IMPLANT
PACK SHOULDER (CUSTOM PROCEDURE TRAY) ×3 IMPLANT
PAD ARMBOARD 7.5X6 YLW CONV (MISCELLANEOUS) ×6 IMPLANT
PIN THREADED REVERSE (PIN) ×3 IMPLANT
RESTRAINT HEAD UNIVERSAL NS (MISCELLANEOUS) ×3 IMPLANT
RETRIEVER SUT HEWSON (MISCELLANEOUS) ×3 IMPLANT
SCREW BONE LOCKING 4.75X30X3.5 (Screw) ×3 IMPLANT
SCREW BONE STRL 6.5MMX25MM (Screw) ×3 IMPLANT
SCREW LOCKING 4.75MMX15MM (Screw) ×3 IMPLANT
SLING ARM IMMOBILIZER LRG (SOFTGOODS) ×6 IMPLANT
SPONGE LAP 18X18 RF (DISPOSABLE) ×6 IMPLANT
SPONGE LAP 18X18 X RAY DECT (DISPOSABLE) ×3 IMPLANT
SPONGE LAP 4X18 RFD (DISPOSABLE) ×3 IMPLANT
STEM HUMERAL STRL 14MMX140MM (Stem) ×3 IMPLANT
STRIP CLOSURE SKIN 1/2X4 (GAUZE/BANDAGES/DRESSINGS) ×2 IMPLANT
SUCTION FRAZIER HANDLE 10FR (MISCELLANEOUS) ×2
SUCTION TUBE FRAZIER 10FR DISP (MISCELLANEOUS) ×1 IMPLANT
SUT BROADBAND TAPE 2PK 1.5 (SUTURE) ×9 IMPLANT
SUT BROADBAND TAPE 2PK 2.3 (SUTURE) ×3 IMPLANT
SUT FIBERWIRE #2 38 T-5 BLUE (SUTURE) ×3
SUT MAXBRAID (SUTURE) IMPLANT
SUT MNCRL AB 3-0 PS2 18 (SUTURE) ×3 IMPLANT
SUT VIC AB 0 CT1 27 (SUTURE) ×4
SUT VIC AB 0 CT1 27XBRD ANBCTR (SUTURE) ×2 IMPLANT
SUT VIC AB 1 CT1 27 (SUTURE) ×6
SUT VIC AB 1 CT1 27XBRD ANBCTR (SUTURE) ×3 IMPLANT
SUT VIC AB 2-0 CT1 27 (SUTURE) ×6
SUT VIC AB 2-0 CT1 TAPERPNT 27 (SUTURE) ×3 IMPLANT
SUT VICRYL 0 UR6 27IN ABS (SUTURE) ×9 IMPLANT
SUTURE FIBERWR #2 38 T-5 BLUE (SUTURE) ×1 IMPLANT
SUTURE TAPE 1.3 40 TPR END (SUTURE) IMPLANT
SUTURETAPE 1.3 40 TPR END (SUTURE)
SYR CONTROL 10ML LL (SYRINGE) IMPLANT
TOWEL OR 17X24 6PK STRL BLUE (TOWEL DISPOSABLE) ×3 IMPLANT
TOWEL OR 17X26 10 PK STRL BLUE (TOWEL DISPOSABLE) ×3 IMPLANT
TRAY FOLEY BAG SILVER LF 16FR (CATHETERS) IMPLANT
WATER STERILE IRR 1000ML POUR (IV SOLUTION) ×3 IMPLANT

## 2018-08-19 NOTE — Transfer of Care (Signed)
Immediate Anesthesia Transfer of Care Note  Patient: Christopher [name]  Procedure(s) Performed: left shoulder replacement (Left )  Patient Location: PACU  Anesthesia Type:GA combined with regional for post-op pain  Level of Consciousness: awake, alert  and oriented  Airway & Oxygen Therapy: Patient Spontanous Breathing and Patient connected to nasal cannula oxygen  Post-op Assessment: Report given to RN and Post -op Vital signs reviewed and stable  Post vital signs: Reviewed and stable  Last Vitals:  Vitals Value Taken Time  BP 107/59 08/19/2018  4:04 PM  Temp 36.1 C 08/19/2018  4:05 PM  Pulse 62 08/19/2018  4:08 PM  Resp 14 08/19/2018  4:08 PM  SpO2 93 % 08/19/2018  4:08 PM  Vitals shown include unvalidated device data.  Last Pain:  Vitals:   08/19/18 1014  TempSrc:   PainSc: 8       Patients Stated Pain Goal: 2 (73/71/06 2694)  Complications: No apparent anesthesia complications

## 2018-08-19 NOTE — Anesthesia Postprocedure Evaluation (Signed)
Anesthesia Post Note  Patient: Christopher [name]  Procedure(s) Performed: left shoulder replacement (Left )     Patient location during evaluation: PACU Anesthesia Type: General and Regional Level of consciousness: patient cooperative, oriented and sedated Pain management: pain level controlled Vital Signs Assessment: post-procedure vital signs reviewed and stable Respiratory status: spontaneous breathing, nonlabored ventilation, respiratory function stable and patient connected to nasal cannula oxygen Cardiovascular status: blood pressure returned to baseline and stable Postop Assessment: no apparent nausea or vomiting Anesthetic complications: no    Last Vitals:  Vitals:   08/19/18 1210 08/19/18 1605  BP: 135/67   Pulse: (!) 54   Resp: (!) 22   Temp:  (!) 36.1 C  SpO2: 98%     Last Pain:  Vitals:   08/19/18 1700  TempSrc:   PainSc: Asleep                 Demeisha Geraghty,E. Brandice Busser

## 2018-08-19 NOTE — Plan of Care (Signed)

## 2018-08-19 NOTE — Anesthesia Preprocedure Evaluation (Signed)
Anesthesia Evaluation  Patient identified by MRN, date of birth, ID band Patient awake    Reviewed: Allergy & Precautions, H&P , NPO status , Patient's Chart, lab work & pertinent test results  History of Anesthesia Complications (+) PONV and history of anesthetic complications (did not like prolonged spinal)  Airway Mallampati: III  TM Distance: >3 FB Neck ROM: Full    Dental  (+) Edentulous Upper, Partial Lower   Pulmonary asthma , sleep apnea and Continuous Positive Airway Pressure Ventilation , COPD, Current Smoker,    breath sounds clear to auscultation       Cardiovascular negative cardio ROS   Rhythm:Regular Rate:Normal     Neuro/Psych  Headaches, PSYCHIATRIC DISORDERS Anxiety Depression  Neuromuscular disease    GI/Hepatic GERD  Medicated and Controlled,  Endo/Other  diabetes, Type 2  Renal/GU      Musculoskeletal  (+) Arthritis ,   Abdominal   Peds  Hematology   Anesthesia Other Findings   Reproductive/Obstetrics                             Anesthesia Physical  Anesthesia Plan  ASA: III  Anesthesia Plan: General   Post-op Pain Management:  Regional for Post-op pain   Induction: Intravenous  PONV Risk Score and Plan: 2 and Ondansetron, Metaclopromide and Treatment may vary due to age or medical condition  Airway Management Planned: Oral ETT and Video Laryngoscope Planned  Additional Equipment:   Intra-op Plan:   Post-operative Plan: Extubation in OR  Informed Consent: I have reviewed the patients History and Physical, chart, labs and discussed the procedure including the risks, benefits and alternatives for the proposed anesthesia with the patient or authorized representative who has indicated his/her understanding and acceptance.     Plan Discussed with:   Anesthesia Plan Comments:         Anesthesia Quick Evaluation

## 2018-08-19 NOTE — Brief Op Note (Signed)
08/19/2018  08/19/2018  4:03 PM  PATIENT:  [name]  62 y.o. male  PRE-OPERATIVE DIAGNOSIS:  left shoulder osteoarthritis  POST-OPERATIVE DIAGNOSIS:  left shoulder osteoarthritis  PROCEDURE:  Procedure(s): left shoulder replacement  SURGEON:  Surgeon(s): Meredith Pel, MD  ASSISTANT: green rnfa ANESTHESIA:   general  EBL: 100 ml    Total I/O In: 1000 [I.V.:1000] Out: 100 [Blood:100]  BLOOD ADMINISTERED: none  DRAINS: none   LOCAL MEDICATIONS USED:  none  SPECIMEN:  No Specimen  COUNTS:  YES  TOURNIQUET:  * No tourniquets in log *  DICTATION: .Other Dictation: Dictation Number [id number]  PLAN OF CARE: Admit to inpatient   PATIENT DISPOSITION:  PACU - hemodynamically stable

## 2018-08-19 NOTE — Op Note (Signed)
NAME: Christopher [name], Christopher [name] MEDICAL RECORD [id number] ACCOUNT [id number] DATE OF BIRTH:28-Mar-1956 FACILITY: [facility] LOCATION: [facility] PHYSICIAN:Liliya Fullenwider Randel Pigg, MD  OPERATIVE REPORT  DATE OF PROCEDURE:  08/19/2018  PREOPERATIVE DIAGNOSIS:  Left shoulder arthritis.  POSTOPERATIVE DIAGNOSIS:  Left shoulder arthritis.  PROCEDURE:  Left total shoulder replacement using Biomet convertible glenoid baseplate with a glenoid liner standard size, 1 central screw and 2 peripheral fixed locking screws with micro stem size 14 x 55 mm with 40 mm head by 24 mm in height.  SURGEON:  Meredith Pel, MD  ASSISTANT:  April Green, RNFA.  INDICATIONS:  This is a 62 year old patient with left shoulder pain.  He has significant arthritis.  He presents now for operative management after explanation of risks and benefits.  PROCEDURE IN DETAIL:  The patient was brought to the operating room where general anesthetic was induced.  Preoperative antibiotics administered.  Timeout was called.  Left arm was prescrubbed with alcohol and Betadine, allowed to air dry.  Prescrubbed  with hydrogen peroxide and alcohol, then Betadine was performed and then ChloraPrep was utilized.  After prepping with ChloraPrep, Ioban used to cover the operative field.  Timeout was called.  The patient's head was in neutral position in the beach  chair positioner.  Incision was made at the coracoid process extending distally.  Skin and subcutaneous tissue were sharply divided.  The deltopectoral interval was developed.  Cephalic vein mobilized medially.  Deltoid was then elevated off of the  humeral head.  Prior biceps tenodesis had been performed.  Circumflex vessels were identified and ligated with silk ligatures.  Axillary nerve visualized and identified and a vessel loop placed around it.  This was protected at all times during the  remaining portion of the case.  At this time, it was elected to proceed with a subscap peel.  He had  prior biceps tenotomy, which altered anatomy in that area.  The coracohumeral ligament was released.  Rotator interval was opened up to the superior  aspect of the glenoid.  The subscap was then peeled off of the lesser tuberosity and tagged with 0 Vicryl sutures.  Capsule was then detached along the inferior aspect of the shoulder joint.  At this time, capsule was removed from the inferior aspect of  the humeral head down the humeral neck.  This was taken around to about the 7 o'clock position.  The rotator cuff was found to be intact.  Reaming was then performed within the humeral shaft.  This was done superiorly.  This was taken up to a size 14  mini stem including broaching and reaming.  A 14 mini stem gave good fixation.  It should be noted that hardware had to be removed including metallic anchor as well as other rotator cuff repairing anchors.  Clide Dales was placed and a cap was placed.   Attention then directed towards the glenoid.  A posterior retractor was placed.  Bankart lesion was created from the 12 o'clock to the 7 o'clock position.  The subscapularis was mobilized in a 360 degree fashion.  At this time, the axillary nerve was  protected.  Good subscap excursion was achieved.  In accordance with preoperative templating, the guide pin was placed within the glenoid.  It was then reamed.  This was reamed to the appropriate depth.  Sclerotic bone was present.  At this time, the  baseplate was placed and a central screw was placed with excellent fixation obtained and 2 peripheral locking screws were placed also with  very good fixation.  The convertible glenoid liner was then placed.  Attention then directed towards the humerus.   Different size heads were placed and the 40 mm head with 24 mm height was chosen.  This closely approximated the patient's normal head accommodating for the loss of articular surface.  At this time, with that prosthesis in position, the patient had 50%  inferior subluxation,  50% posterior subluxation without dislocation and the patient could internally rotate parallel to the torso and externally rotate the hand above head position.  Trial components were removed on the humeral side.  Thorough irrigation  performed.  True components placed with same stability parameters maintained.  At this time, the subscap was repaired through drill holes in the lesser tuberosity x6.  Six MaxBraid suture anchors were used in Nice knot fashion.  Rotator interval was  then reapproximated using 0 Vicryl suture.  This gave very good reapproximation of the rotator cuff.  The patient had about 45-50 degrees of external rotation at 15 degrees of abduction after subscap repair.  At this time, it should be noted that  vancomycin powder was placed into the joint and then into the layer above the closure of the deltopectoral interval.  Also, in the skin layer, which was also irrigated.  The deltopectoral layer was closed using #1 Vicryl suture followed by interrupted  inverted 0 Vicryl suture, 2-0 Vicryl suture and a 3-0 Monocryl.  Aquacel dressing placed.  The patient tolerated the procedure well without immediate complications.  He was transferred to the recovery room in stable condition.  TN/NUANCE  D:08/19/2018 T:08/19/2018 JOB:[id number]

## 2018-08-19 NOTE — Anesthesia Procedure Notes (Signed)
Anesthesia Regional Block: Supraclavicular block   Pre-Anesthetic Checklist: ,, timeout performed, Correct Patient, Correct Site, Correct Laterality, Correct Procedure, Correct Position, site marked, Risks and benefits discussed,  Surgical consent,  Pre-op evaluation,  At surgeon's request and post-op pain management  Laterality: Left and Upper  Prep: Maximum Sterile Barrier Precautions used, chloraprep       Needles:  Injection technique: Single-shot  Needle Type: Echogenic Stimulator Needle     Needle Length: 10cm      Additional Needles:   Procedures:,,,, ultrasound used (permanent image in chart),,,,  Narrative:  Start time: 08/19/2018 11:40 AM End time: 08/19/2018 11:50 AM Injection made incrementally with aspirations every 5 mL.  Performed by: Personally  Anesthesiologist: Montez Hageman, MD  Additional Notes: Risks, benefits and alternative to block explained extensively.  Patient tolerated procedure well, without complications.

## 2018-08-19 NOTE — H&P (Signed)
Christopher [name] is an 62 y.o. male.   Chief Complaint: Left shoulder pain HPI: Christopher [name] is a 62 year old patient with left shoulder pain.  He has had arthroscopy and rotator cuff repair x2 in 2015.  Reports progressive and debilitating pain in the left shoulder.  MRI scan shows intact rotator cuff with end-stage glenohumeral arthritis.  CT scan also shows some posterior wear of the glenoid but not to the degree that would require augmentation of the glenoid component.  The patient reports night pain and rest pain and pain which interferes with his activities of daily living.  He presents now for shoulder replacement after explanation of risks and benefits.  Past Medical History:  Diagnosis Date  . Anxiety   . Arthritis   . Asthma   . Complication of anesthesia    pt had a hard time being able to move after spinal anesthesia , 3-4 hours  . Depression   . GERD (gastroesophageal reflux disease)   . Headache(784.0)    after surgery  . Heart murmur    Years ago- not now  . Hyperlipidemia   . IBS (irritable bowel syndrome)   . PONV (postoperative nausea and vomiting)   . Sleep apnea    uses CIPAP machine at night    Past Surgical History:  Procedure Laterality Date  . APPENDECTOMY    . BACK SURGERY     neck and back fusion  . BIOPSY  12/27/2015   Procedure: BIOPSY;  Surgeon: Rogene Houston, MD;  [facility];  Service: Endoscopy;;  Fundus biopsies and duodenal biopsies  . CARDIAC CATHETERIZATION    . CARDIAC CATHETERIZATION N/A 09/04/2016   Procedure: Right/Left Heart Cath and Coronary Angiography;  Surgeon: Peter M Martinique, MD;  Location: [place] [facility];  Service: Cardiovascular;  Laterality: N/A;  . CHOLECYSTECTOMY    . CHONDROPLASTY  08/15/2011   Procedure: CHONDROPLASTY;  Surgeon: [name], MD;  Location: [facility];  Service: Orthopedics;  Laterality: Left;  . COLONOSCOPY  06/27/2011   Procedure: COLONOSCOPY;  Surgeon: Rogene Houston, MD;  [facility];   Service: Endoscopy;  Laterality: N/A;  9:00 / Pt to be here at 9am for 10:45 procedure, benign polyps removed  . COLONOSCOPY N/A 10/05/2014   Procedure: COLONOSCOPY;  Surgeon: Rogene Houston, MD;  [facility];  Service: Endoscopy;  Laterality: N/A;  930  . COLONOSCOPY N/A 02/11/2018   Procedure: COLONOSCOPY;  Surgeon: Rogene Houston, MD;  [facility];  Service: Endoscopy;  Laterality: N/A;  830  . ESOPHAGOGASTRODUODENOSCOPY N/A 12/27/2015   Procedure: ESOPHAGOGASTRODUODENOSCOPY (EGD);  Surgeon: Rogene Houston, MD;  [facility];  Service: Endoscopy;  Laterality: N/A;  3:00  . HERNIA REPAIR     umbilical hernia  . KNEE ARTHROSCOPY     left knee  . KNEE ARTHROSCOPY     right knee   . LUMBAR LAMINECTOMY/DECOMPRESSION MICRODISCECTOMY  09/14/2012   Procedure: LUMBAR LAMINECTOMY/DECOMPRESSION MICRODISCECTOMY 1 LEVEL;  Surgeon: Floyce Stakes, MD;  Location: [place] [facility];  Service: Neurosurgery;  Laterality: Right;  Right Lumbar three-four Diskectomy  . neck fusion    . POLYPECTOMY  02/11/2018   Procedure: POLYPECTOMY;  Surgeon: Rogene Houston, MD;  [facility];  Service: Endoscopy;;  colon  . SHOULDER ARTHROSCOPY WITH BICEPSTENOTOMY Left 09/23/2013   Procedure: SHOULDER ARTHROSCOPY WITH BICEPSTENOTOMY AND EXTENSIVE DEBRIDEMENT;  Surgeon: Carole Civil, MD;  Location: [facility];  Service: Orthopedics;  Laterality:  Left;  . SHOULDER ARTHROSCOPY WITH ROTATOR CUFF REPAIR Left 05/05/2014   Procedure: SHOULDER ARTHROSCOPY LIMITED DEBRIDEMENT;  Surgeon: Carole Civil, MD;  Location: [facility];  Service: Orthopedics;  Laterality: Left;  . SHOULDER OPEN ROTATOR CUFF REPAIR Left 05/05/2014   Procedure: ROTATOR CUFF REPAIR SHOULDER OPEN;  Surgeon: Carole Civil, MD;  Location: [facility];  Service: Orthopedics;  Laterality: Left;  . SHOULDER SURGERY Right    Open Mumford procedure  . SPINAL FUSION     x 2  . TOTAL KNEE ARTHROPLASTY Left 06/16/2017    Procedure: LEFT TOTAL KNEE ARTHROPLASTY;  Surgeon: Carole Civil, MD;  Location: [facility];  Service: Orthopedics;  Laterality: Left;    Family History  Problem Relation Age of Onset  . Diabetes Other   . Lung disease Other   . Arthritis Other   . Anesthesia problems Neg Hx   . Hypotension Neg Hx   . Malignant hyperthermia Neg Hx   . Pseudochol deficiency Neg Hx    Social History:  reports that he has been smoking cigarettes. He has a 41.00 pack-year smoking history. He has never used smokeless tobacco. He reports that he does not drink alcohol or use drugs.  Allergies:  Allergies  Allergen Reactions  . Celebrex [Celecoxib] Itching, Swelling and Other (See Comments)    All over  . Codeine Nausea And Vomiting and Other (See Comments)    Extreme stomach pain. This includes anything with the derivative of codeine in it.  . Doxycycline Swelling and Other (See Comments)    Made tongue turn black   . Cortisone Swelling  . Oxycodone-Acetaminophen Itching and Other (See Comments)    Can not  tolerate with benadryl   . Prednisone Swelling  . Relafen [Nabumetone] Swelling    Medications Prior to Admission  Medication Sig Dispense Refill  . ALPRAZolam (XANAX) 0.5 MG tablet Take 0.5 mg by mouth 3 (three) times daily as needed for anxiety or sleep.     Marland Kitchen dicyclomine (BENTYL) 10 MG capsule TAKE 1 CAPSULE THREE TIMES DAILY BETWEEN MEALS (Patient taking differently: Take 10 mg by mouth every other day. ) 90 capsule 5  . HYDROcodone-acetaminophen (NORCO) 5-325 MG tablet Take 1 tablet by mouth every 6 (six) hours as needed for moderate pain. (Patient taking differently: Take 1 tablet by mouth 3 (three) times daily. ) 30 tablet 0  . methocarbamol (ROBAXIN) 500 MG tablet Take 1 tablet (500 mg total) by mouth every 6 (six) hours as needed for muscle spasms. (Patient taking differently: Take 500 mg by mouth 2 (two) times daily as needed for muscle spasms. ) 60 tablet 1  . pantoprazole (PROTONIX) 40  MG tablet Take 40 mg by mouth daily.     . sertraline (ZOLOFT) 50 MG tablet Take 50 mg by mouth daily.     . temazepam (RESTORIL) 30 MG capsule Take 30 mg by mouth at bedtime.     Marland Kitchen ibuprofen (ADVIL,MOTRIN) 800 MG tablet Take 1 tablet (800 mg total) by mouth every 8 (eight) hours as needed. (Patient not taking: Reported on 08/09/2018) 90 tablet 0  . nitroGLYCERIN (NITROSTAT) 0.4 MG SL tablet Place 1 tablet (0.4 mg total) under the tongue every 5 (five) minutes as needed for chest pain. Please schedule office visit. 15 tablet 0  . PROAIR HFA 108 (90 Base) MCG/ACT inhaler Inhale 3 puffs into the lungs every 6 (six) hours as needed for wheezing or shortness of breath.       No  results found for this or any previous visit (from the past 26 hour(s)). No results found.  Review of Systems  Musculoskeletal: Positive for joint pain.  All other systems reviewed and are negative.   Blood pressure 132/62, pulse (!) 56, temperature 98.1 F (36.7 C), temperature source Oral, resp. rate 16, height 5\' 6"  (1.676 m), weight 102.6 kg, SpO2 98 %. Physical Exam  Constitutional: He appears well-developed.  HENT:  Head: Normocephalic.  Eyes: Pupils are equal, round, and reactive to light.  Neck: Normal range of motion.  Cardiovascular: Normal rate.  Respiratory: Effort normal.  Neurological: He is alert.  Skin: Skin is warm.  Psychiatric: He has a normal mood and affect.  Examination of the left shoulder demonstrates diminished range of motion but functional deltoid.  There is some coarse grinding and crepitus with active and passive range of motion.  Patient has good rotator cuff strength isolated infraspinatus supraspinatus and subscap muscle testing.  No discrete AC joint tenderness to direct palpation.  Patient has forward flexion and abduction approaching 90 degrees.  External rotation of 15 degrees of abduction is about 30 degrees.  There is no warmth to the shoulder and all prior arthroscopy incisions  are well-healed.  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  The patient is very active on his farm.  Plan at this time is for left shoulder replacement.  Risk and benefits are discussed including but not limited to infection nerve vessel damage limited range of motion incomplete pain relief as well as decreased longevity of the prosthesis particularly associated with heavy labor.  He is counseled against doing heavy lifting particularly overhead lifting in the postoperative period following the shoulder replacement.  Patient understands the risk and benefits and wishes to proceed.  Anderson Malta, MD 08/19/2018, 11:53 AM

## 2018-08-19 NOTE — Anesthesia Procedure Notes (Signed)
Procedure Name: Intubation Date/Time: 08/19/2018 12:40 PM Performed by: Inda Coke, CRNA Pre-anesthesia Checklist: Patient identified, Emergency Drugs available, Suction available and Patient being monitored Patient Re-evaluated:Patient Re-evaluated prior to induction Oxygen Delivery Method: Circle System Utilized Preoxygenation: Pre-oxygenation with 100% oxygen Induction Type: IV induction Ventilation: Mask ventilation without difficulty and Oral airway inserted - appropriate to patient size Laryngoscope Size: Glidescope and 4 Grade View: Grade I Tube type: Oral Tube size: 7.5 mm Number of attempts: 1 Airway Equipment and Method: Oral airway,  Video-laryngoscopy and Rigid stylet Placement Confirmation: ETT inserted through vocal cords under direct vision,  positive ETCO2 and breath sounds checked- equal and bilateral Secured at: 22 cm Tube secured with: Tape Dental Injury: Teeth and Oropharynx as per pre-operative assessment  Difficulty Due To: Difficult Airway- due to reduced neck mobility and Difficult Airway- due to large tongue Future Recommendations: Recommend- induction with short-acting agent, and alternative techniques readily available

## 2018-08-20 ENCOUNTER — Encounter (HOSPITAL_COMMUNITY): Payer: Self-pay | Admitting: Orthopedic Surgery

## 2018-08-20 LAB — MRSA PCR SCREENING: MRSA BY PCR: NEGATIVE

## 2018-08-20 MED ORDER — HYDROCODONE-ACETAMINOPHEN 10-325 MG PO TABS: 1.0000 | ORAL_TABLET | ORAL | 0 refills | Status: DC | PRN

## 2018-08-20 MED ORDER — ASPIRIN 81 MG PO TBEC: 81.0000 mg | DELAYED_RELEASE_TABLET | Freq: Two times a day (BID) | ORAL | 0 refills | Status: DC

## 2018-08-20 MED ORDER — METHOCARBAMOL 500 MG PO TABS: 500.0000 mg | ORAL_TABLET | Freq: Four times a day (QID) | ORAL | 0 refills | Status: DC | PRN

## 2018-08-20 NOTE — Progress Notes (Signed)
Subjective: Pt stable - no pain yet   Objective: Vital signs in last 24 hours: Temp:  [97 F (36.1 C)-98.6 F (37 C)] 98.6 F (37 C) (12/20 0824) Pulse Rate:  [51-71] 54 (12/20 0824) Resp:  [10-22] 20 (12/20 0824) BP: (95-135)/(54-67) 118/56 (12/20 0824) SpO2:  [89 %-100 %] 100 % (12/20 0824)  Intake/Output from previous day: 12/19 0701 - 12/20 0700 In: 2086.7 [P.O.:120; I.V.:1966.7] Out: 600 [Urine:500; Blood:100] Intake/Output this shift: No intake/output data recorded.  Exam:  No cellulitis present Compartment soft  Labs: No results for input(s): HGB in the last 72 hours. No results for input(s): WBC, RBC, HCT, PLT in the last 72 hours. No results for input(s): NA, K, CL, CO2, BUN, CREATININE, GLUCOSE, CALCIUM in the last 72 hours. No results for input(s): LABPT, INR in the last 72 hours.  Assessment/Plan: Plan dc today   G Alphonzo Severance 08/20/2018, 11:56 AM

## 2018-08-20 NOTE — Plan of Care (Signed)
  Problem: Clinical Measurements: Goal: Will remain free from infection Outcome: Progressing   Problem: Pain Managment: Goal: General experience of comfort will improve Outcome: Progressing   Problem: Safety: Goal: Ability to remain free from injury will improve Outcome: Progressing   Problem: Skin Integrity: Goal: Risk for impaired skin integrity will decrease Outcome: Progressing   Problem: Education: Goal: Knowledge of General Education information will improve Description Including pain rating scale, medication(s)/side effects and non-pharmacologic comfort measures Outcome: Progressing

## 2018-08-20 NOTE — Progress Notes (Signed)
Discharge instructions provided to the patient and his wife.  They both verbalize understanding those instructions and the follow up appointment.  The patient will be discharged to via wheelchair with his wife.

## 2018-08-20 NOTE — Evaluation (Signed)
Occupational Therapy Evaluation and Discharge Patient Details Name: Christopher [name] MRN: [id number] DOB: 1956/04/27 Today's Date: 08/20/2018    History of Present Illness Left total shoulder replacement    Clinical Impression   This 62 yo male admitted and underwent above presents to acute OT with all education completed, we will D/C from acute OT.    Follow Up Recommendations  Other (comment)(follow up per Dr. Nicki Reaper)    Equipment Recommendations  None recommended by OT       Precautions / Restrictions Precautions Precautions: Shoulder Type of Shoulder Precautions: Sling At all times except ADL / exercise; Non weight bearing-Yes; AROM elbow, wrist and hand to tolerance- Yes; OK for pendulums- Yes; Forward Flexion 0-90; Abduction 0-60; External Rotation 0-30;  AROM of shoulder-No Shoulder Interventions: Shoulder sling/immobilizer;Off for dressing/bathing/exercises Precaution Booklet Issued: Yes (comment) Required Braces or Orthoses: Sling Restrictions Weight Bearing Restrictions: Yes LUE Weight Bearing: Non weight bearing      Mobility Bed Mobility Overal bed mobility: Modified Independent                Transfers Overall transfer level: Independent                        ADL either performed or assessed with clinical judgement         Vision Patient Visual Report: No change from baseline              Pertinent Vitals/Pain Pain Assessment: 0-10 Pain Score: 10-Worst pain ever(started out as 8) Pain Location: left shoulder Pain Descriptors / Indicators: Aching;Sore Pain Intervention(s): Limited activity within patient's tolerance;Monitored during session;Repositioned;Patient requesting pain meds-RN notified;RN gave pain meds during session;Ice applied     Hand Dominance  right   Extremity/Trunk Assessment Upper Extremity Assessment Upper Extremity Assessment: LUE deficits/detail LUE Deficits / Details: shoulder sx this admission without  block totally worn off; PROM shoulder; AAROM elbow, AROM forearm to wrist LUE Coordination: decreased fine motor;decreased gross motor           Communication  no issues   Cognition Arousal/Alertness: Awake/alert Behavior During Therapy: WFL for tasks assessed/performed Overall Cognitive Status: Within Functional Limits for tasks assessed                                        Exercises Other Exercises Other Exercises: Pt completed in supine 10 reps of PROM shoulder flexion to ~30 degrees(pt knows goal is 90 degrees); in sitting he completed 10 reps of PROM of shoulder external rotation almost getting to neutral (pt knows goal is 30 degrees) and shoulder abduction ~45 degrees (pt knows goal is 60 degrees) Other Exercises: Pt also completed 10 reps of AAROM for elbow flexion/extension in standing (needed A due to block not worn off yet); completed 10 reps of AAROM while seated for forearm supination, wrist flexion/extension, and composite flexion/extension of digits (need A to support weight of arm due to block not totally worn off yet--could do movements but not support his arm at same time)   Shoulder Instructions Shoulder Instructions Donning/doffing shirt without moving shoulder: (caregiver and pt verbalize understanding for button up and pull over shirt; pt not wanting to put shirt on yet) Method for sponge bathing under operated UE: Caregiver independent with task Donning/doffing sling/immobilizer: Caregiver independent with task Correct positioning of sling/immobilizer: Caregiver independent with task Pendulum exercises (written home exercise program): (Pt  and caregiver able to verbalize understanding post my demonstration; block not worn of yet so arm feels like it is pulling when let down of sling--instruted not to do these until he has full control of his arm) ROM for elbow, wrist and digits of operated UE: Caregiver independent with task(wife needing to A as of now  due to block not totally worn off) Sling wearing schedule (on at all times/off for ADL's): Independent Proper positioning of operated UE when showering: Independent Dressing change: (NA) Positioning of UE while sleeping: Caregiver independent with task    Home Living Family/patient expects to be discharged to:: Private residence Living Arrangements: Spouse/significant other                                               OT Problem List: Decreased strength;Decreased range of motion;Impaired UE functional use;Pain         OT Goals(Current goals can be found in the care plan section) Acute Rehab OT Goals Patient Stated Goal: home today  OT Frequency:                AM-PAC OT "6 Clicks" Daily Activity     Outcome Measure Help from another person eating meals?: A Little Help from another person taking care of personal grooming?: A Little Help from another person toileting, which includes using toliet, bedpan, or urinal?: A Little Help from another person bathing (including washing, rinsing, drying)?: A Little Help from another person to put on and taking off regular upper body clothing?: A Lot Help from another person to put on and taking off regular lower body clothing?: A Lot 6 Click Score: 16   End of Session Nurse Communication: (pt ready to go from OT standpoint)  Activity Tolerance: Patient tolerated treatment well Patient left: in bed;with call bell/phone within reach;with family/visitor present  OT Visit Diagnosis: Pain Pain - Right/Left: Left Pain - part of body: Shoulder                Time: 7017-7939 OT Time Calculation (min): 30 min Charges:  OT General Charges $OT Visit: 1 Visit OT Evaluation $OT Eval Moderate Complexity: 1 Mod OT Treatments $Self Care/Home Management : 8-22 mins    Golden Circle, OTR/L Acute Rehab Services Pager [phone] Office [phone]     Almon Register 08/20/2018, 9:22 AM

## 2018-08-23 ENCOUNTER — Telehealth (INDEPENDENT_AMBULATORY_CARE_PROVIDER_SITE_OTHER): Payer: Self-pay

## 2018-08-23 DIAGNOSIS — M19011 Primary osteoarthritis, right shoulder: Secondary | ICD-10-CM | POA: Diagnosis not present

## 2018-08-23 DIAGNOSIS — Z96611 Presence of right artificial shoulder joint: Secondary | ICD-10-CM | POA: Diagnosis not present

## 2018-08-23 NOTE — Telephone Encounter (Signed)
Pt is s/p a left total shoulder and is sch to have OT go out and work with the pt is required to do a one time PT eval and gave verbal ok for this.

## 2018-08-24 ENCOUNTER — Other Ambulatory Visit: Payer: Self-pay

## 2018-08-24 NOTE — Discharge Summary (Signed)
Physician Discharge Summary  Patient ID: Christopher [name] MRN: [id number] DOB/AGE: [birth year] 62 y.o.  Admit date: 08/19/2018 Discharge date: 08/20/2018  Admission Diagnoses:  Active Problems:   Shoulder arthritis   Discharge Diagnoses:  Same  Surgeries: Procedure(s): left shoulder replacement on 08/19/2018   Consultants:   Discharged Condition: Stable  Hospital Course: Christopher [name] is an 62 y.o. male who was admitted 08/19/2018 with a chief complaint of left shoulder pain, and found to have a diagnosis of left shoulder arthritis.  They were brought to the operating room on 08/19/2018 and underwent the above named procedures.  Tolerated the procedure well without immediate complications.  He was seen by occupational therapy on postop day #1.  Discharged home in good condition with instructions for passive range of motion of the left shoulder.  I will see him back within 10 days for clinical recheck.  Antibiotics given:  Anti-infectives (From admission, onward)   Start     Dose/Rate Route Frequency Ordered Stop   08/19/18 2300  vancomycin (VANCOCIN) IVPB 1000 mg/200 mL premix     1,000 mg 200 mL/hr over 60 Minutes Intravenous Every 12 hours 08/19/18 1731 08/19/18 2309   08/19/18 1458  vancomycin (VANCOCIN) powder  Status:  Discontinued       As needed 08/19/18 1458 08/19/18 1559   08/19/18 1215  vancomycin (VANCOCIN) IVPB 1000 mg/200 mL premix     1,000 mg 200 mL/hr over 60 Minutes Intravenous  Once 08/19/18 1205 08/19/18 1530   08/19/18 1000  ceFAZolin (ANCEF) IVPB 2g/100 mL premix     2 g 200 mL/hr over 30 Minutes Intravenous On call to O.R. 08/19/18 0955 08/19/18 1350    .  Recent vital signs:  Vitals:   08/20/18 0443 08/20/18 0824  BP: 117/63 (!) 118/56  Pulse: (!) 55 (!) 54  Resp: 18 20  Temp: 98.2 F (36.8 C) 98.6 F (37 C)  SpO2: 97% 100%    Recent laboratory studies:  Results for orders placed or performed during the hospital encounter of 08/19/18   MRSA PCR Screening  Result Value Ref Range   MRSA by PCR NEGATIVE NEGATIVE    Discharge Medications:   Allergies as of 08/20/2018      Reactions   Celebrex [celecoxib] Itching, Swelling, Other (See Comments)   All over   Codeine Nausea And Vomiting, Other (See Comments)   Extreme stomach pain. This includes anything with the derivative of codeine in it.   Doxycycline Swelling, Other (See Comments)   Made tongue turn black    Cortisone Swelling   Oxycodone-acetaminophen Itching, Other (See Comments)   Can not  tolerate with benadryl    Prednisone Swelling   Relafen [nabumetone] Swelling      Medication List    STOP taking these medications   HYDROcodone-acetaminophen 5-325 MG tablet Commonly known as:  NORCO Replaced by:  HYDROcodone-acetaminophen 10-325 MG tablet     TAKE these medications   ALPRAZolam 0.5 MG tablet Commonly known as:  XANAX Take 0.5 mg by mouth 2 (two) times daily as needed for anxiety or sleep.   aspirin 81 MG EC tablet Take 1 tablet (81 mg total) by mouth 2 (two) times daily.   dicyclomine 10 MG capsule Commonly known as:  BENTYL TAKE 1 CAPSULE THREE TIMES DAILY BETWEEN MEALS What changed:  See the new instructions.   HYDROcodone-acetaminophen 10-325 MG tablet Commonly known as:  NORCO Take 1 tablet by mouth every 4 (four) hours as needed for moderate pain.  Replaces:  HYDROcodone-acetaminophen 5-325 MG tablet   methocarbamol 500 MG tablet Commonly known as:  ROBAXIN Take 1 tablet (500 mg total) by mouth every 6 (six) hours as needed for muscle spasms. What changed:  when to take this   nitroGLYCERIN 0.4 MG SL tablet Commonly known as:  NITROSTAT Place 1 tablet (0.4 mg total) under the tongue every 5 (five) minutes as needed for chest pain. Please schedule office visit.   pantoprazole 40 MG tablet Commonly known as:  PROTONIX Take 40 mg by mouth daily.   PROAIR HFA 108 (90 Base) MCG/ACT inhaler Generic drug:  albuterol Inhale 3 puffs  into the lungs every 6 (six) hours as needed for wheezing or shortness of breath.   sertraline 50 MG tablet Commonly known as:  ZOLOFT Take 50 mg by mouth daily.   temazepam 30 MG capsule Commonly known as:  RESTORIL Take 30 mg by mouth at bedtime.       Diagnostic Studies: Dg Shoulder Left Port  Result Date: 08/19/2018 CLINICAL DATA:  62 y/o  M; left shoulder replacement. EXAM: LEFT SHOULDER - 1 VIEW COMPARISON:  07/11/2018 left shoulder CT. 05/21/2018 left shoulder radiographs. FINDINGS: 10 mm left acromioclavicular joint separation. Normal coracoclavicular interval. Left shoulder replacement, no periprosthetic lucency or fracture identified on this single frontal view. IMPRESSION: 10 mm left acromioclavicular joint separation. Interval left shoulder replacement, no apparent hardware related complication. Electronically Signed   By: Kristine Garbe M.D.   On: 08/19/2018 18:49    Disposition:   Discharge Instructions    Call MD / Call 911   Complete by:  As directed    If you experience chest pain or shortness of breath, CALL 911 and be transported to the hospital emergency room.  If you develope a fever above 101 F, pus (white drainage) or increased drainage or redness at the wound, or calf pain, call your surgeon's office.   Constipation Prevention   Complete by:  As directed    Drink plenty of fluids.  Prune juice may be helpful.  You may use a stool softener, such as Colace (over the counter) 100 mg twice a day.  Use MiraLax (over the counter) for constipation as needed.   Diet - low sodium heart healthy   Complete by:  As directed    Discharge instructions   Complete by:  As directed    Ok to shower - dressing waterproof cpm 1 hour 3 times a day No lifting with left arm   Increase activity slowly as tolerated   Complete by:  As directed          Signed: Anderson Malta 08/24/2018, 9:44 AM

## 2018-08-24 NOTE — Patient Outreach (Signed)
[facility]) Care Management  [place]   08/24/2018  Christopher [name] [birth year] [id number]  Reason for referral: Medication Reconciliation Post Discharge  Current insurance:[employer]  PMHx includes but not limited to:   angina, COPD and hyperlipidemia  Outreach:  Successful telephone call with Christopher [name]'s wife, Christopher [name]. HIPAA identifiers verified.   Subjective:  Christopher [name] reports that her husband does have some pain after his shoulder surgery, but is doing well.  She reports that he will see his orthopedist on 09/06/17.  Objective: Lab Results  Component Value Date   CREATININE 1.04 08/17/2018   CREATININE 0.96 06/11/2017   CREATININE 1.05 09/03/2016    Lab Results  Component Value Date   HGBA1C 5.6 06/11/2017    Lipid Panel  No results found for: CHOL, TRIG, HDL, CHOLHDL, VLDL, LDLCALC, LDLDIRECT  BP Readings from Last 3 Encounters:  08/20/18 (!) 118/56  08/17/18 123/61  06/14/18 115/65    Allergies  Allergen Reactions  . Celebrex [Celecoxib] Itching, Swelling and Other (See Comments)    All over  . Codeine Nausea And Vomiting and Other (See Comments)    Extreme stomach pain. This includes anything with the derivative of codeine in it.  . Doxycycline Swelling and Other (See Comments)    Made tongue turn black   . Cortisone Swelling  . Oxycodone-Acetaminophen Itching and Other (See Comments)    Can not  tolerate with benadryl   . Prednisone Swelling  . Relafen [Nabumetone] Swelling    Medications Reviewed Today    Reviewed by Christopher [name], [facility] (Pharmacist) on 08/24/18 at 0857  Med List Status: <None>  Medication Order Taking? Sig Documenting Provider Last Dose Status Informant  ALPRAZolam (XANAX) 0.5 MG tablet [id number] Yes Take 0.5 mg by mouth 2 (two) times daily as needed for anxiety or sleep.  [provider] Taking Active Spouse/Significant Other  aspirin EC 81 MG EC tablet [id number] Yes  Take 1 tablet (81 mg total) by mouth 2 (two) times daily. Christopher Pel, MD Taking Active   dicyclomine (BENTYL) 10 MG capsule [id number] Yes TAKE 1 CAPSULE THREE TIMES DAILY BETWEEN MEALS  Patient taking differently:  Take 10 mg by mouth every other day.    Christopher Houston, MD Taking Active Spouse/Significant Other  HYDROcodone-acetaminophen (NORCO) 10-325 MG tablet [id number] Yes Take 1 tablet by mouth every 4 (four) hours as needed for moderate pain. Christopher Pel, MD Taking Active        Patient not taking:       Discontinued 08/24/18 0856 (Completed Course)   methocarbamol (ROBAXIN) 500 MG tablet [id number] Yes Take 1 tablet (500 mg total) by mouth every 6 (six) hours as needed for muscle spasms. Christopher Pel, MD Taking Active   nitroGLYCERIN (NITROSTAT) 0.4 MG SL tablet [id number] No Place 1 tablet (0.4 mg total) under the tongue every 5 (five) minutes as needed for chest pain. Please schedule office visit.  Patient not taking:  Reported on 08/24/2018   Christopher Records, MD Not Taking Active Spouse/Significant Other  pantoprazole (PROTONIX) 40 MG tablet [id number] Yes Take 40 mg by mouth daily.  [provider] Taking Active Spouse/Significant Other  PROAIR HFA 108 [phone] Base) MCG/ACT inhaler [id number] No Inhale 3 puffs into the lungs every 6 (six) hours as needed for wheezing or shortness of breath.  [provider] Not Taking Active Spouse/Significant Other  sertraline (ZOLOFT) 50 MG tablet [id number] Yes Take 50 mg by mouth daily.  [provider] Taking Active Spouse/Significant Other  temazepam (RESTORIL) 30 MG capsule [id number] Yes Take 30 mg by mouth at bedtime.  [provider] Taking Active Spouse/Significant Other         ASSESSMENT: Date Discharged from Hospital: 08/20/18 Date Medication Reconciliation Performed: 08/24/2018  Medications:  New at Discharge: . Aspirin . Hydrocodone/APAP . Methocarbamol  Patient was recently  discharged from hospital and all medications have been reviewed.  Assessment:  Drugs sorted by system:  Neurologic/Psychologic: alprazolam, sertraline, temazepam  Cardiovascular: aspirin 81 mg, nitroglycerin   Pulmonary/Allergy: albuterol MDI  Gastrointestinal: pantoprazole  Pain: hydrocodone/APAP  Miscellaneous: methocarbamol  Plan: Route not to PCP, Dr. Luan [name].  Christopher [name], PharmD Clinical Pharmacist [place] [phone]

## 2018-08-26 ENCOUNTER — Telehealth (INDEPENDENT_AMBULATORY_CARE_PROVIDER_SITE_OTHER): Payer: Self-pay

## 2018-08-26 NOTE — Telephone Encounter (Signed)
FYI from [place] from [facility]: will be starting home health services on 08/27/18.

## 2018-08-30 ENCOUNTER — Encounter: Payer: Self-pay | Admitting: Pharmacist

## 2018-08-31 DIAGNOSIS — M19012 Primary osteoarthritis, left shoulder: Secondary | ICD-10-CM

## 2018-08-31 NOTE — Telephone Encounter (Signed)
Ok thx.

## 2018-09-06 ENCOUNTER — Ambulatory Visit (INDEPENDENT_AMBULATORY_CARE_PROVIDER_SITE_OTHER): Payer: PPO

## 2018-09-06 ENCOUNTER — Encounter (INDEPENDENT_AMBULATORY_CARE_PROVIDER_SITE_OTHER): Payer: Self-pay | Admitting: Orthopedic Surgery

## 2018-09-06 ENCOUNTER — Ambulatory Visit (INDEPENDENT_AMBULATORY_CARE_PROVIDER_SITE_OTHER): Payer: PPO | Admitting: Orthopedic Surgery

## 2018-09-06 DIAGNOSIS — M19012 Primary osteoarthritis, left shoulder: Secondary | ICD-10-CM | POA: Diagnosis not present

## 2018-09-06 MED ORDER — HYDROCODONE-ACETAMINOPHEN 10-325 MG PO TABS: 1.0000 | ORAL_TABLET | Freq: Three times a day (TID) | ORAL | 0 refills | Status: DC

## 2018-09-10 ENCOUNTER — Telehealth (HOSPITAL_COMMUNITY): Payer: Self-pay

## 2018-09-10 NOTE — Telephone Encounter (Signed)
Called pt with benefit information l/m. NF

## 2018-09-11 ENCOUNTER — Encounter (INDEPENDENT_AMBULATORY_CARE_PROVIDER_SITE_OTHER): Payer: Self-pay | Admitting: Orthopedic Surgery

## 2018-09-11 NOTE — Progress Notes (Signed)
Post-Op Visit Note   Patient: Christopher [name]           Date of Birth: 1956/03/15           MRN: [id number] Visit Date: 09/06/2018 PCP: Sinda Du, MD   Assessment & Plan:  Chief Complaint:  Chief Complaint  Patient presents with  . Left Shoulder - Pain   Visit Diagnoses:  1. Primary osteoarthritis, left shoulder     Plan: [name] is now 2 weeks out left shoulder replacement.  Surgery date 08/19/2018.  Taking Norco and that is refilled.  He is doing CPM machine 1-2 times a day for an hour.  Patient is a smoker.  This will affect his subscapularis healing.  On exam deltoid is functional and he has pretty good passive range of motion.  Subscap also appears to be functional and intact.  Plan at this time is 4-week return with switch over to anything in physical therapy.  I will see him back in 4 weeks for clinical recheck and we can start doing strengthening at that time.  Follow-Up Instructions: Return in about 4 weeks (around 10/04/2018).   Orders:  Orders Placed This Encounter  Procedures  . XR Shoulder Left   Meds ordered this encounter  Medications  . HYDROcodone-acetaminophen (NORCO) 10-325 MG tablet    Sig: Take 1 tablet by mouth every 8 (eight) hours.    Dispense:  40 tablet    Refill:  0    Imaging: No results found.  PMFS History: Patient Active Problem List   Diagnosis Date Noted  . Primary osteoarthritis, left shoulder   . 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  . History of colonic polyps 11/26/2017  . S/P total knee replacement, left 06/16/17 06/16/2017  . Primary osteoarthritis of left knee   . Hyperlipidemia 09/04/2016  . Angina pectoris ([place]) 09/04/2016  . OSA (obstructive sleep apnea) 09/04/2016  . COPD (chronic obstructive pulmonary disease) ([place]) 09/04/2016  . Rotator cuff tear 05/05/2014  . S/P shoulder surgery 09/26/2013  . Arthritis, shoulder region 09/26/2013  . Bursitis, shoulder 09/26/2013  . Synovitis of shoulder 09/26/2013  . Labral tear of  shoulder, degenerative 09/26/2013  . Biceps tendon tear 09/26/2013  . Rotator cuff syndrome of left shoulder 06/21/2013  . Arthritis 06/21/2013  . Patellar tendinitis 03/29/2013  . Effusion of knee joint 03/29/2013  . Bursitis/tendonitis, shoulder 03/10/2013  . Effusion of knee joint, left 08/18/2011  . Knee pain 08/18/2011  . Acute torn meniscus 07/30/2011  . Old torn meniscus of knee 07/30/2011  . ARTHRITIS, LEFT KNEE 10/15/2010  . MEDIAL MENISCUS TEAR, RIGHT 10/15/2010  . HIP PAIN 01/15/2010  . DEGENERATIVE DISC DISEASE, LUMBOSACRAL SPINE W/RADICULOPATHY 01/15/2010  . PLICA SYNDROME 39/10/90  . DERANGEMENT MENISCUS 07/09/2009  . JOINT EFFUSION, LEFT KNEE 07/09/2009  . KNEE, ARTHRITIS, DEGEN./OSTEO 01/30/2009  . KNEE PAIN 01/30/2009  . ANKLE SPRAIN, RIGHT 11/08/2007   Past Medical History:  Diagnosis Date  . Anxiety   . Arthritis   . Asthma   . Complication of anesthesia    pt had a hard time being able to move after spinal anesthesia , 3-4 hours  . Depression   . GERD (gastroesophageal reflux disease)   . Headache(784.0)    after surgery  . Heart murmur    Years ago- not now  . Hyperlipidemia   . IBS (irritable bowel syndrome)   . PONV (postoperative nausea and vomiting)   . Sleep apnea    uses CIPAP machine at night  Family History  Problem Relation Age of Onset  . Diabetes Other   . Lung disease Other   . Arthritis Other   . Anesthesia problems Neg Hx   . Hypotension Neg Hx   . Malignant hyperthermia Neg Hx   . Pseudochol deficiency Neg Hx     Past Surgical History:  Procedure Laterality Date  . APPENDECTOMY    . BACK SURGERY     neck and back fusion  . BIOPSY  12/27/2015   Procedure: BIOPSY;  Surgeon: Rogene Houston, MD;  [facility];  Service: Endoscopy;;  Fundus biopsies and duodenal biopsies  . CARDIAC CATHETERIZATION    . CARDIAC CATHETERIZATION N/A 09/04/2016   Procedure: Right/Left Heart Cath and Coronary Angiography;  Surgeon:  Peter M Martinique, MD;  Location: [place] [facility];  Service: Cardiovascular;  Laterality: N/A;  . CHOLECYSTECTOMY    . CHONDROPLASTY  08/15/2011   Procedure: CHONDROPLASTY;  Surgeon: [name], MD;  Location: [facility];  Service: Orthopedics;  Laterality: Left;  . COLONOSCOPY  06/27/2011   Procedure: COLONOSCOPY;  Surgeon: Rogene Houston, MD;  [facility];  Service: Endoscopy;  Laterality: N/A;  9:00 / Pt to be here at 9am for 10:45 procedure, benign polyps removed  . COLONOSCOPY N/A 10/05/2014   Procedure: COLONOSCOPY;  Surgeon: Rogene Houston, MD;  [facility];  Service: Endoscopy;  Laterality: N/A;  930  . COLONOSCOPY N/A 02/11/2018   Procedure: COLONOSCOPY;  Surgeon: Rogene Houston, MD;  [facility];  Service: Endoscopy;  Laterality: N/A;  830  . ESOPHAGOGASTRODUODENOSCOPY N/A 12/27/2015   Procedure: ESOPHAGOGASTRODUODENOSCOPY (EGD);  Surgeon: Rogene Houston, MD;  [facility];  Service: Endoscopy;  Laterality: N/A;  3:00  . HERNIA REPAIR     umbilical hernia  . KNEE ARTHROSCOPY     left knee  . KNEE ARTHROSCOPY     right knee   . LUMBAR LAMINECTOMY/DECOMPRESSION MICRODISCECTOMY  09/14/2012   Procedure: LUMBAR LAMINECTOMY/DECOMPRESSION MICRODISCECTOMY 1 LEVEL;  Surgeon: Floyce Stakes, MD;  Location: [place] [facility];  Service: Neurosurgery;  Laterality: Right;  Right Lumbar three-four Diskectomy  . neck fusion    . POLYPECTOMY  02/11/2018   Procedure: POLYPECTOMY;  Surgeon: Rogene Houston, MD;  [facility];  Service: Endoscopy;;  colon  . SHOULDER ARTHROSCOPY WITH BICEPSTENOTOMY Left 09/23/2013   Procedure: SHOULDER ARTHROSCOPY WITH BICEPSTENOTOMY AND EXTENSIVE DEBRIDEMENT;  Surgeon: Carole Civil, MD;  Location: [facility];  Service: Orthopedics;  Laterality: Left;  . SHOULDER ARTHROSCOPY WITH ROTATOR CUFF REPAIR Left 05/05/2014   Procedure: SHOULDER ARTHROSCOPY LIMITED DEBRIDEMENT;  Surgeon: Carole Civil, MD;   Location: [facility];  Service: Orthopedics;  Laterality: Left;  . SHOULDER OPEN ROTATOR CUFF REPAIR Left 05/05/2014   Procedure: ROTATOR CUFF REPAIR SHOULDER OPEN;  Surgeon: Carole Civil, MD;  Location: [facility];  Service: Orthopedics;  Laterality: Left;  . SHOULDER SURGERY Right    Open [place] procedure  . SPINAL FUSION     x 2  . TOTAL KNEE ARTHROPLASTY Left 06/16/2017   Procedure: LEFT TOTAL KNEE ARTHROPLASTY;  Surgeon: Carole Civil, MD;  Location: [facility];  Service: Orthopedics;  Laterality: Left;  . TOTAL SHOULDER ARTHROPLASTY Left 08/19/2018   Procedure: left shoulder replacement;  Surgeon: Meredith Pel, MD;  Location: [place];  Service: Orthopedics;  Laterality: Left;   Social History   Occupational History  . 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 Smoking status: Current Every Day Smoker    Packs/day: 1.00    Years: 41.00    Pack years: 41.00    Types: Cigarettes  . Smokeless tobacco: Never Used  . Tobacco comment: smokes a pack a day. since age 74  Substance and Sexual Activity  . Alcohol use: No    Alcohol/week: 0.0 standard drinks  . Drug use: No  . Sexual activity: Not Currently

## 2018-09-13 ENCOUNTER — Encounter (HOSPITAL_COMMUNITY): Payer: Self-pay

## 2018-09-13 ENCOUNTER — Other Ambulatory Visit: Payer: Self-pay

## 2018-09-13 ENCOUNTER — Ambulatory Visit (HOSPITAL_COMMUNITY): Payer: PPO | Attending: Orthopedic Surgery

## 2018-09-13 DIAGNOSIS — M25612 Stiffness of left shoulder, not elsewhere classified: Secondary | ICD-10-CM | POA: Insufficient documentation

## 2018-09-13 DIAGNOSIS — R29898 Other symptoms and signs involving the musculoskeletal system: Secondary | ICD-10-CM | POA: Diagnosis not present

## 2018-09-13 DIAGNOSIS — M25512 Pain in left shoulder: Secondary | ICD-10-CM | POA: Diagnosis not present

## 2018-09-13 NOTE — Therapy (Signed)
Christopher [name], [place], [address] Phone: [phone]   Fax:  [phone]  Occupational Therapy Evaluation  Patient Details  Name: Christopher [name] MRN: [id number] Date of Birth: [birth year] Referring Provider (OT): Dr Tonna Corner   Encounter Date: 09/13/2018  OT End of Session - 09/13/18 1657    Visit Number  1    Number of Visits  12    Date for OT Re-Evaluation  10/25/18    Authorization Type  [employer] $10 copay no visit limit    OT Start Time  1600    OT Stop Time  1645    OT Time Calculation (min)  45 min    Activity Tolerance  Patient tolerated treatment well    Behavior During Therapy  [facility] for tasks assessed/performed       Past Medical History:  Diagnosis Date  . Anxiety   . Arthritis   . Asthma   . Complication of anesthesia    pt had a hard time being able to move after spinal anesthesia , 3-4 hours  . Depression   . GERD (gastroesophageal reflux disease)   . Headache(784.0)    after surgery  . Heart murmur    Years ago- not now  . Hyperlipidemia   . IBS (irritable bowel syndrome)   . PONV (postoperative nausea and vomiting)   . Sleep apnea    uses CIPAP machine at night    Past Surgical History:  Procedure Laterality Date  . APPENDECTOMY    . BACK SURGERY     neck and back fusion  . BIOPSY  12/27/2015   Procedure: BIOPSY;  Surgeon: Rogene Houston, MD;  [facility];  Service: Endoscopy;;  Fundus biopsies and duodenal biopsies  . CARDIAC CATHETERIZATION    . CARDIAC CATHETERIZATION N/A 09/04/2016   Procedure: Right/Left Heart Cath and Coronary Angiography;  Surgeon: Peter M Martinique, MD;  Location: [place] [facility];  Service: Cardiovascular;  Laterality: N/A;  . CHOLECYSTECTOMY    . CHONDROPLASTY  08/15/2011   Procedure: CHONDROPLASTY;  Surgeon: [name], MD;  Location: [facility];  Service: Orthopedics;  Laterality: Left;  . COLONOSCOPY  06/27/2011   Procedure:  COLONOSCOPY;  Surgeon: Rogene Houston, MD;  [facility];  Service: Endoscopy;  Laterality: N/A;  9:00 / Pt to be here at 9am for 10:45 procedure, benign polyps removed  . COLONOSCOPY N/A 10/05/2014   Procedure: COLONOSCOPY;  Surgeon: Rogene Houston, MD;  [facility];  Service: Endoscopy;  Laterality: N/A;  930  . COLONOSCOPY N/A 02/11/2018   Procedure: COLONOSCOPY;  Surgeon: Rogene Houston, MD;  [facility];  Service: Endoscopy;  Laterality: N/A;  830  . ESOPHAGOGASTRODUODENOSCOPY N/A 12/27/2015   Procedure: ESOPHAGOGASTRODUODENOSCOPY (EGD);  Surgeon: Rogene Houston, MD;  [facility];  Service: Endoscopy;  Laterality: N/A;  3:00  . HERNIA REPAIR     umbilical hernia  . KNEE ARTHROSCOPY     left knee  . KNEE ARTHROSCOPY     right knee   . LUMBAR LAMINECTOMY/DECOMPRESSION MICRODISCECTOMY  09/14/2012   Procedure: LUMBAR LAMINECTOMY/DECOMPRESSION MICRODISCECTOMY 1 LEVEL;  Surgeon: Floyce Stakes, MD;  Location: [place] [facility];  Service: Neurosurgery;  Laterality: Right;  Right Lumbar three-four Diskectomy  . neck fusion    . POLYPECTOMY  02/11/2018   Procedure: POLYPECTOMY;  Surgeon: Rogene Houston, MD;  [facility];  Service: Endoscopy;;  colon  .  SHOULDER ARTHROSCOPY WITH BICEPSTENOTOMY Left 09/23/2013   Procedure: SHOULDER ARTHROSCOPY WITH BICEPSTENOTOMY AND EXTENSIVE DEBRIDEMENT;  Surgeon: Carole Civil, MD;  Location: [facility];  Service: Orthopedics;  Laterality: Left;  . SHOULDER ARTHROSCOPY WITH ROTATOR CUFF REPAIR Left 05/05/2014   Procedure: SHOULDER ARTHROSCOPY LIMITED DEBRIDEMENT;  Surgeon: Carole Civil, MD;  Location: [facility];  Service: Orthopedics;  Laterality: Left;  . SHOULDER OPEN ROTATOR CUFF REPAIR Left 05/05/2014   Procedure: ROTATOR CUFF REPAIR SHOULDER OPEN;  Surgeon: Carole Civil, MD;  Location: [facility];  Service: Orthopedics;  Laterality: Left;  . SHOULDER SURGERY Right    Open Mumford procedure  .  SPINAL FUSION     x 2  . TOTAL KNEE ARTHROPLASTY Left 06/16/2017   Procedure: LEFT TOTAL KNEE ARTHROPLASTY;  Surgeon: Carole Civil, MD;  Location: [facility];  Service: Orthopedics;  Laterality: Left;  . TOTAL SHOULDER ARTHROPLASTY Left 08/19/2018   Procedure: left shoulder replacement;  Surgeon: Meredith Pel, MD;  Location: [facility];  Service: Orthopedics;  Laterality: Left;    There were no vitals filed for this visit.  Subjective Assessment - 09/13/18 1608    Subjective   S: It was wear and tear and arthritis ate it up.    Pertinent History  Patient is a 63 y/o male S/P left shoulder replacement which occurred on 08/19/18. patient arrives stating his has been out of the sling for a week now. Dr. Marlou Sa has referred patient to occupational therapy for evaluation and treatment.     Special Tests  FOTO score: 50/100    Patient Stated Goals  To be able to use his arm as normal as possible.    Currently in Pain?  Yes    Pain Score  5     Pain Location  Shoulder    Pain Orientation  Left    Pain Descriptors / Indicators  Sore    Pain Type  Surgical pain    Pain Radiating Towards  N/A    Pain Onset  1 to 4 weeks ago    Pain Frequency  Constant    Aggravating Factors   pulling on things.     Pain Relieving Factors  elbow flexion/extension, pain medication, ice    Effect of Pain on Daily Activities  moderate effect        [facility] OT Assessment - 09/13/18 1605      Assessment   Medical Diagnosis  left shoulder replacement    Referring Provider (OT)  Dr Tonna Corner    Onset Date/Surgical Date  08/16/18    Hand Dominance  Right    Next MD Visit  10/08/18    Prior Therapy  One time OT in acute care.      Precautions   Precautions  Shoulder    Type of Shoulder Precautions  P/ROM Flexion to 120, abduction to 100, er to 30. isometric strengthening ok except for subscapularis.      Restrictions   Weight Bearing Restrictions  Yes    LUE Weight Bearing  Non weight bearing       Balance Screen   Has the patient fallen in the past 6 months  No      Home  Environment   Family/patient expects to be discharged to:  Private residence    Lives With  Spouse      Prior Function   Level of Independence  Independent    Vocation  On disability    Leisure  Enjoys taking  cars a part.       ADL   ADL comments  Pt is unable to complete most daily tasks using his LUE.       Mobility   Mobility Status  Independent      Written Expression   Dominant Hand  Right      Vision - History   Baseline Vision  No visual deficits      Cognition   Overall Cognitive Status  Within Functional Limits for tasks assessed      Observation/Other Assessments   Skin Integrity  12.5cm incision/healed scar    Focus on Therapeutic Outcomes (FOTO)   50/100      ROM / Strength   AROM / PROM / Strength  AROM;PROM;Strength      Palpation   Palpation comment  Max fascial restrictions in upper arm and anterior shoulder region.       AROM   Overall AROM   Unable to assess;Due to precautions      PROM   Overall PROM Comments  Assessed supine. IR/er adducted.    PROM Assessment Site  Shoulder    Right/Left Shoulder  Left    Left Shoulder Flexion  90 Degrees    Left Shoulder ABduction  180 Degrees    Left Shoulder Internal Rotation  90 Degrees    Left Shoulder External Rotation  10 Degrees      Strength   Overall Strength  Unable to assess;Due to precautions                      OT Education - 09/13/18 1656    Education Details  table slides, A/ROM elbow flexion/extension, pendulums    Person(s) Educated  Patient    Methods  Explanation;Demonstration;Verbal cues;Handout    Comprehension  Verbalized understanding;Returned demonstration       OT Short Term Goals - 09/13/18 1700      OT SHORT TERM GOAL #1   Title  Patient will be educated and independent with HEP to faciliate progress in therapy and allow him to return to using his LUE for all daily and leisure  tasks.     Time  3    Period  Weeks    Status  New    Target Date  10/04/18      OT SHORT TERM GOAL #2   Title  Patient will increase P/ROM of LUE to WNL to increase ability to get his shirts on and off without need for modification technique.     Time  3    Period  Weeks    Status  New      OT SHORT TERM GOAL #3   Title  Patient will decrease fascial restrictions to mod amount in his LUE in order to increase functional mobility needed to complete reaching tasks.     Time  3    Period  Weeks    Status  New      OT SHORT TERM GOAL #4   Title  Patient will increase LUE strength to 3/5 in order to complete daily tasks below shoulder level with less difficulty.     Time  3    Period  Weeks    Status  New        OT Long Term Goals - 09/13/18 1702      OT LONG TERM GOAL #1   Title  Patient will return to highest level of independence while using his LUE for all  daily and leisure tasks at his highest level of independence.     Time  6    Period  Weeks    Status  New    Target Date  10/25/18      OT LONG TERM GOAL #2   Title  Patient will increase LUE A/ROM to WNL in order to complete overhead reaching tasks without difficulty.     Time  6    Period  Weeks    Status  New      OT LONG TERM GOAL #3   Title  Patient will increase LUE strength to 4+/5 to allow him to return to working on his cars without shoulder fatigue and pain.     Time  6    Period  Weeks    Status  New      OT LONG TERM GOAL #4   Title  Patient will decrease LUE pain to approximately 2/10 or less when completing daily and leisure tasks.     Time  6    Period  Weeks    Status  New      OT LONG TERM GOAL #5   Title  Patient will decrease his fascial restrictions to min amount or less in order to increase his functional mobility needed to complete activities above shoulder level.     Time  6    Period  Weeks    Status  New            Plan - 09/13/18 1658    Clinical Impression Statement  A:  Patient is a 63 y/o male S/P left shoulder replacement causing increased pain, fascial restrictions and decreased strength and ROM resulting in difficulty completing daily and leisure tasks using his LUE as his nondominant extremity.     Occupational Profile and client history currently impacting functional performance  Pt is motivated to return to prior level of function. Patient was independent with his LUE prior to surgery.    Occupational performance deficits (Please refer to evaluation for details):  ADL's;IADL's;Rest and Sleep;Leisure    Rehab Potential  Excellent    Current Impairments/barriers affecting progress:  N/A    OT Frequency  2x / week    OT Duration  6 weeks    OT Treatment/Interventions  Self-care/ADL training;Electrical Stimulation;Therapeutic exercise;Patient/family education;Neuromuscular education;Moist Heat;Functional Furniture conservator/restorer;Therapeutic activities;Passive range of motion;Manual Therapy;DME and/or AE instruction;Ultrasound;Cryotherapy    Plan  P: Patient will benefit from skilled OT services to increase functional performance during daily and leisure tasks while his LUE as his non dominant extremity. Treatment Plan: Follow protocol. Myofascial release, manual stretching, P/ROM, AA/ROM, A/ROM, and general shoulder and scapular strengthening. Modalities PRN.     Clinical Decision Making  Several treatment options, min-mod task modification necessary    Consulted and Agree with Plan of Care  Patient       Patient will benefit from skilled therapeutic intervention in order to improve the following deficits and impairments:  Pain, Increased fascial restrictions, Decreased range of motion, Decreased strength, Impaired UE functional use  Visit Diagnosis: Other symptoms and signs involving the musculoskeletal system - Plan: Ot plan of care cert/re-cert  Stiffness of left shoulder, not elsewhere classified - Plan: Ot plan of care cert/re-cert  Acute pain of left shoulder  - Plan: Ot plan of care cert/re-cert    Problem List Patient Active Problem List   Diagnosis Date Noted  . Primary osteoarthritis, left shoulder   . 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  . History  of colonic polyps 11/26/2017  . S/P total knee replacement, left 06/16/17 06/16/2017  . Primary osteoarthritis of left knee   . Hyperlipidemia 09/04/2016  . Angina pectoris ([place]) 09/04/2016  . OSA (obstructive sleep apnea) 09/04/2016  . COPD (chronic obstructive pulmonary disease) ([place]) 09/04/2016  . Rotator cuff tear 05/05/2014  . S/P shoulder surgery 09/26/2013  . Arthritis, shoulder region 09/26/2013  . Bursitis, shoulder 09/26/2013  . Synovitis of shoulder 09/26/2013  . Labral tear of shoulder, degenerative 09/26/2013  . Biceps tendon tear 09/26/2013  . Rotator cuff syndrome of left shoulder 06/21/2013  . Arthritis 06/21/2013  . Patellar tendinitis 03/29/2013  . Effusion of knee joint 03/29/2013  . Bursitis/tendonitis, shoulder 03/10/2013  . Effusion of knee joint, left 08/18/2011  . Knee pain 08/18/2011  . Acute torn meniscus 07/30/2011  . Old torn meniscus of knee 07/30/2011  . ARTHRITIS, LEFT KNEE 10/15/2010  . MEDIAL MENISCUS TEAR, RIGHT 10/15/2010  . HIP PAIN 01/15/2010  . DEGENERATIVE DISC DISEASE, LUMBOSACRAL SPINE W/RADICULOPATHY 01/15/2010  . PLICA SYNDROME 29/51/8841  . DERANGEMENT MENISCUS 07/09/2009  . JOINT EFFUSION, LEFT KNEE 07/09/2009  . KNEE, ARTHRITIS, DEGEN./OSTEO 01/30/2009  . KNEE PAIN 01/30/2009  . ANKLE SPRAIN, RIGHT 11/08/2007    Ailene Ravel, OTR/L,CBIS  [phone]  09/13/2018, 5:06 PM  [facility] [address] [place], [place], [address] Phone: [phone]   Fax:  [phone]  Name: ORELL HURTADO MRN: [id number] Date of Birth: December 21, 1955

## 2018-09-13 NOTE — Patient Instructions (Signed)
TOWEL SLIDES COMPLETE FOR 1-3 MINUTES, 3-5 TIMES PER DAY  SHOULDER: Flexion On Table   Place hands on table, elbows straight. Move hips away from body. Press hands down into table. Hold ___ seconds. ___ reps per set, ___ sets per day, ___ days per week  Abduction (Passive)   With arm out to side, resting on table, lower head toward arm, keeping trunk away from table. Hold ____ seconds. Repeat ____ times. Do ____ sessions per day. (PALM DOWN)  Copyright  VHI. All rights reserved.     Internal Rotation (Assistive)   Seated with elbow bent at right angle and held against side, slide arm on table surface in an inward arc. Repeat ____ times. Do ____ sessions per day. Activity: Use this motion to brush crumbs off the table.  Copyright  VHI. All rights reserved.    COMPLETE PENDULUM EXERCISES FOR 30 SECONDS TO A MINUTE EACH, 3-5 TIMES PER DAY. ROM: Pendulum (Side-to-Side)     http://orth.exer.us/792   Copyright  VHI. All rights reserved.  Pendulum Forward/Back   Bend forward 90 at waist, using table for support. Rock body forward and back to swing arm. Repeat ____ times. Do ____ sessions per day.     ELBOW FLEXION EXTENSION  Bend your elbow upwards as shown and then lower to a straighten position.   10-15 times.

## 2018-09-15 ENCOUNTER — Ambulatory Visit (HOSPITAL_COMMUNITY): Payer: PPO

## 2018-09-15 ENCOUNTER — Encounter (HOSPITAL_COMMUNITY): Payer: Self-pay

## 2018-09-15 DIAGNOSIS — R29898 Other symptoms and signs involving the musculoskeletal system: Secondary | ICD-10-CM | POA: Diagnosis not present

## 2018-09-15 DIAGNOSIS — M25612 Stiffness of left shoulder, not elsewhere classified: Secondary | ICD-10-CM

## 2018-09-15 DIAGNOSIS — M25512 Pain in left shoulder: Secondary | ICD-10-CM

## 2018-09-15 NOTE — Therapy (Signed)
[place], [place], [address] Phone: [phone]   Fax:  [phone]  Occupational Therapy Treatment  Patient Details  Name: Christopher [name] MRN: [id number] Date of Birth: 11/05/1955 Referring Provider (OT): Dr Tonna Corner   Encounter Date: 09/15/2018  OT End of Session - 09/15/18 1421    Visit Number  2    Number of Visits  12    Date for OT Re-Evaluation  10/25/18    Authorization Type  [employer] $10 copay no visit limit    OT Start Time  6144   patient was not shown as checked in   OT Stop Time  1435    OT Time Calculation (min)  37 min    Activity Tolerance  Patient tolerated treatment well    Behavior During Therapy  [facility] for tasks assessed/performed       Past Medical History:  Diagnosis Date  . Anxiety   . Arthritis   . Asthma   . Complication of anesthesia    pt had a hard time being able to move after spinal anesthesia , 3-4 hours  . Depression   . GERD (gastroesophageal reflux disease)   . Headache(784.0)    after surgery  . Heart murmur    Years ago- not now  . Hyperlipidemia   . IBS (irritable bowel syndrome)   . PONV (postoperative nausea and vomiting)   . Sleep apnea    uses CIPAP machine at night    Past Surgical History:  Procedure Laterality Date  . APPENDECTOMY    . BACK SURGERY     neck and back fusion  . BIOPSY  12/27/2015   Procedure: BIOPSY;  Surgeon: Rogene Houston, MD;  [facility];  Service: Endoscopy;;  Fundus biopsies and duodenal biopsies  . CARDIAC CATHETERIZATION    . CARDIAC CATHETERIZATION N/A 09/04/2016   Procedure: Right/Left Heart Cath and Coronary Angiography;  Surgeon: Peter M Martinique, MD;  Location: [place] [facility];  Service: Cardiovascular;  Laterality: N/A;  . CHOLECYSTECTOMY    . CHONDROPLASTY  08/15/2011   Procedure: CHONDROPLASTY;  Surgeon: [name], MD;  Location: [facility];  Service: Orthopedics;  Laterality: Left;  .  COLONOSCOPY  06/27/2011   Procedure: COLONOSCOPY;  Surgeon: Rogene Houston, MD;  [facility];  Service: Endoscopy;  Laterality: N/A;  9:00 / Pt to be here at 9am for 10:45 procedure, benign polyps removed  . COLONOSCOPY N/A 10/05/2014   Procedure: COLONOSCOPY;  Surgeon: Rogene Houston, MD;  [facility];  Service: Endoscopy;  Laterality: N/A;  930  . COLONOSCOPY N/A 02/11/2018   Procedure: COLONOSCOPY;  Surgeon: Rogene Houston, MD;  [facility];  Service: Endoscopy;  Laterality: N/A;  830  . ESOPHAGOGASTRODUODENOSCOPY N/A 12/27/2015   Procedure: ESOPHAGOGASTRODUODENOSCOPY (EGD);  Surgeon: Rogene Houston, MD;  [facility];  Service: Endoscopy;  Laterality: N/A;  3:00  . HERNIA REPAIR     umbilical hernia  . KNEE ARTHROSCOPY     left knee  . KNEE ARTHROSCOPY     right knee   . LUMBAR LAMINECTOMY/DECOMPRESSION MICRODISCECTOMY  09/14/2012   Procedure: LUMBAR LAMINECTOMY/DECOMPRESSION MICRODISCECTOMY 1 LEVEL;  Surgeon: Floyce Stakes, MD;  Location: [place] [facility];  Service: Neurosurgery;  Laterality: Right;  Right Lumbar three-four Diskectomy  . neck fusion    . POLYPECTOMY  02/11/2018   Procedure: POLYPECTOMY;  Surgeon: Rogene Houston, MD;  [facility];  Service: Endoscopy;;  colon  . SHOULDER ARTHROSCOPY WITH BICEPSTENOTOMY Left 09/23/2013   Procedure: SHOULDER ARTHROSCOPY WITH BICEPSTENOTOMY AND EXTENSIVE DEBRIDEMENT;  Surgeon: Carole Civil, MD;  Location: [facility];  Service: Orthopedics;  Laterality: Left;  . SHOULDER ARTHROSCOPY WITH ROTATOR CUFF REPAIR Left 05/05/2014   Procedure: SHOULDER ARTHROSCOPY LIMITED DEBRIDEMENT;  Surgeon: Carole Civil, MD;  [facility];  Service: Orthopedics;  Laterality: Left;  . SHOULDER OPEN ROTATOR CUFF REPAIR Left 05/05/2014   Procedure: ROTATOR CUFF REPAIR SHOULDER OPEN;  Surgeon: Carole Civil, MD;  [facility]: [facility];  Service: Orthopedics;  Laterality: Left;  . SHOULDER SURGERY  Right    Open Mumford procedure  . SPINAL FUSION     x 2  . TOTAL KNEE ARTHROPLASTY Left 06/16/2017   Procedure: LEFT TOTAL KNEE ARTHROPLASTY;  Surgeon: Carole Civil, MD;  [facility]: [facility];  Service: Orthopedics;  Laterality: Left;  . TOTAL SHOULDER ARTHROPLASTY Left 08/19/2018   Procedure: left shoulder replacement;  Surgeon: Meredith Pel, MD;  Location: [place];  Service: Orthopedics;  Laterality: Left;    There were no vitals filed for this visit.  Subjective Assessment - 09/15/18 1416    Subjective   S: I had to sleep in the recliner the night after I came here. I was sore.     Currently in Pain?  Yes    Pain Score  5     Pain Location  Shoulder    Pain Orientation  Left    Pain Descriptors / Indicators  Sore    Pain Type  Surgical pain         [facility] OT Assessment - 09/15/18 1417      Assessment   Medical Diagnosis  left shoulder replacement      Precautions   Precautions  Shoulder    Type of Shoulder Precautions  P/ROM Flexion to 120, abduction to 100, er to 30. isometric strengthening ok except for subscapularis.               OT Treatments/Exercises (OP) - 09/15/18 1417      Exercises   Exercises  Shoulder      Shoulder Exercises: Supine   Protraction  PROM;5 reps    Horizontal ABduction  PROM;5 reps    External Rotation  PROM;5 reps    Internal Rotation  PROM;5 reps    Flexion  PROM;5 reps    ABduction  PROM;5 reps      Shoulder Exercises: Seated   Extension  AROM;10 reps    Row  AROM;10 reps    Other Seated Exercises  scapular depression; 10X; A/ROM      Shoulder Exercises: Therapy Ball   Flexion  10 reps    ABduction  10 reps      Shoulder Exercises: ROM/Strengthening   Thumb Tacks  1'    Anterior Glide  1'    Prot/Ret//Elev/Dep  1'      Shoulder Exercises: Isometric Strengthening   Flexion  Supine;5X5"    Extension  Supine;5X5"    External Rotation  Supine;5X5"    Internal Rotation  --   Do not do per protocol.    ABduction  Supine;5X5"    ADduction  Supine;5X5"      Manual Therapy   Manual Therapy  Soft tissue mobilization    Manual therapy comments  completed seperate from all other interventions    Soft tissue mobilization  Myofascial release and manual stretching completed to left  upper arm, trapezius, and scapularis region to decrease fascial restrictions and increase joint mobility in a pain free zone.                OT Short Term Goals - 09/15/18 1422      OT SHORT TERM GOAL #1   Title  Patient will be educated and independent with HEP to faciliate progress in therapy and allow him to return to using his LUE for all daily and leisure tasks.     Time  3    Period  Weeks    Status  On-going      OT SHORT TERM GOAL #2   Title  Patient will increase P/ROM of LUE to WNL to increase ability to get his shirts on and off without need for modification technique.     Time  3    Period  Weeks    Status  On-going      OT SHORT TERM GOAL #3   Title  Patient will decrease fascial restrictions to mod amount in his LUE in order to increase functional mobility needed to complete reaching tasks.     Time  3    Period  Weeks    Status  On-going      OT SHORT TERM GOAL #4   Title  Patient will increase LUE strength to 3/5 in order to complete daily tasks below shoulder level with less difficulty.     Time  3    Period  Weeks    Status  On-going        OT Long Term Goals - 09/15/18 1422      OT LONG TERM GOAL #1   Title  Patient will return to highest level of independence while using his LUE for all daily and leisure tasks at his highest level of independence.     Time  6    Period  Weeks    Status  On-going      OT LONG TERM GOAL #2   Title  Patient will increase LUE A/ROM to WNL in order to complete overhead reaching tasks without difficulty.     Time  6    Period  Weeks    Status  On-going      OT LONG TERM GOAL #3   Title  Patient will increase LUE strength to 4+/5 to allow  him to return to working on his cars without shoulder fatigue and pain.     Time  6    Period  Weeks    Status  On-going      OT LONG TERM GOAL #4   Title  Patient will decrease LUE pain to approximately 2/10 or less when completing daily and leisure tasks.     Time  6    Period  Weeks    Status  On-going      OT LONG TERM GOAL #5   Title  Patient will decrease his fascial restrictions to min amount or less in order to increase his functional mobility needed to complete activities above shoulder level.     Time  6    Period  Weeks    Status  On-going            Plan - 09/15/18 1551    Clinical Impression Statement  A: Initiated myofascial release, manual stretching, P/ROM, isometrics, and therapy ball stretches. Patient was able to tolerate all ROM exercises. Pain felt with er and was unable to achieve 30  degrees. VC for form and technique. Manual techniques completed to address fascial restrictions.     Plan  P: Continue to follow precautions. Work on increasing tolerance to achieve 30 degrees external rotation.    Consulted and Agree with Plan of Care  Patient       Patient will benefit from skilled therapeutic intervention in order to improve the following deficits and impairments:  Pain, Increased fascial restrictions, Decreased range of motion, Decreased strength, Impaired UE functional use  Visit Diagnosis: Other symptoms and signs involving the musculoskeletal system  Stiffness of left shoulder, not elsewhere classified  Acute pain of left shoulder    Problem List Patient Active Problem List   Diagnosis Date Noted  . Primary osteoarthritis, left shoulder   . 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  . History of colonic polyps 11/26/2017  . S/P total knee replacement, left 06/16/17 06/16/2017  . Primary osteoarthritis of left knee   . Hyperlipidemia 09/04/2016  . Angina pectoris ([place]) 09/04/2016  . OSA (obstructive sleep apnea) 09/04/2016  . COPD (chronic obstructive  pulmonary disease) ([place]) 09/04/2016  . Rotator cuff tear 05/05/2014  . S/P shoulder surgery 09/26/2013  . Arthritis, shoulder region 09/26/2013  . Bursitis, shoulder 09/26/2013  . Synovitis of shoulder 09/26/2013  . Labral tear of shoulder, degenerative 09/26/2013  . Biceps tendon tear 09/26/2013  . Rotator cuff syndrome of left shoulder 06/21/2013  . Arthritis 06/21/2013  . Patellar tendinitis 03/29/2013  . Effusion of knee joint 03/29/2013  . Bursitis/tendonitis, shoulder 03/10/2013  . Effusion of knee joint, left 08/18/2011  . Knee pain 08/18/2011  . Acute torn meniscus 07/30/2011  . Old torn meniscus of knee 07/30/2011  . ARTHRITIS, LEFT KNEE 10/15/2010  . MEDIAL MENISCUS TEAR, RIGHT 10/15/2010  . HIP PAIN 01/15/2010  . DEGENERATIVE DISC DISEASE, LUMBOSACRAL SPINE W/RADICULOPATHY 01/15/2010  . PLICA SYNDROME 61/95/0932  . DERANGEMENT MENISCUS 07/09/2009  . JOINT EFFUSION, LEFT KNEE 07/09/2009  . KNEE, ARTHRITIS, DEGEN./OSTEO 01/30/2009  . KNEE PAIN 01/30/2009  . ANKLE SPRAIN, RIGHT 11/08/2007   Ailene Ravel, OTR/L,CBIS  [phone]  09/15/2018, 4:06 PM  [place] [address] [place], [place], [address] Phone: [phone]   Fax:  [phone]  Name: TAMOTSU WIEDERHOLT MRN: [id number] Date of Birth: [birth year]

## 2018-09-23 ENCOUNTER — Ambulatory Visit (HOSPITAL_COMMUNITY): Payer: PPO

## 2018-09-23 ENCOUNTER — Encounter (HOSPITAL_COMMUNITY): Payer: Self-pay

## 2018-09-23 DIAGNOSIS — R29898 Other symptoms and signs involving the musculoskeletal system: Secondary | ICD-10-CM

## 2018-09-23 DIAGNOSIS — M25512 Pain in left shoulder: Secondary | ICD-10-CM

## 2018-09-23 DIAGNOSIS — M25612 Stiffness of left shoulder, not elsewhere classified: Secondary | ICD-10-CM

## 2018-09-23 NOTE — Therapy (Signed)
[place], [place], [address] Phone: [phone]   Fax:  [phone]  Occupational Therapy Treatment  Patient Details  Name: Christopher [name] MRN: [id number] Date of Birth: 1955-11-01 Referring Provider (OT): Dr Tonna Corner   Encounter Date: 09/23/2018  OT End of Session - 09/23/18 1357    Visit Number  3    Number of Visits  12    Date for OT Re-Evaluation  10/25/18    Authorization Type  [employer] $10 copay no visit limit    OT Start Time  1305    OT Stop Time  1345    OT Time Calculation (min)  40 min    Activity Tolerance  Patient tolerated treatment well    Behavior During Therapy  [facility] for tasks assessed/performed       Past Medical History:  Diagnosis Date  . Anxiety   . Arthritis   . Asthma   . Complication of anesthesia    pt had a hard time being able to move after spinal anesthesia , 3-4 hours  . Depression   . GERD (gastroesophageal reflux disease)   . Headache(784.0)    after surgery  . Heart murmur    Years ago- not now  . Hyperlipidemia   . IBS (irritable bowel syndrome)   . PONV (postoperative nausea and vomiting)   . Sleep apnea    uses CIPAP machine at night    Past Surgical History:  Procedure Laterality Date  . APPENDECTOMY    . BACK SURGERY     neck and back fusion  . BIOPSY  12/27/2015   Procedure: BIOPSY;  Surgeon: Rogene Houston, MD;  [facility];  Service: Endoscopy;;  Fundus biopsies and duodenal biopsies  . CARDIAC CATHETERIZATION    . CARDIAC CATHETERIZATION N/A 09/04/2016   Procedure: Right/Left Heart Cath and Coronary Angiography;  Surgeon: Peter M Martinique, MD;  Location: [place] [facility];  Service: Cardiovascular;  Laterality: N/A;  . CHOLECYSTECTOMY    . CHONDROPLASTY  08/15/2011   Procedure: CHONDROPLASTY;  Surgeon: [name], MD;  [facility]: [facility];  Service: Orthopedics;  Laterality: Left;  . COLONOSCOPY  06/27/2011   Procedure:  COLONOSCOPY;  Surgeon: Rogene Houston, MD;  [facility];  Service: Endoscopy;  Laterality: N/A;  9:00 / Pt to be here at 9am for 10:45 procedure, benign polyps removed  . COLONOSCOPY N/A 10/05/2014   Procedure: COLONOSCOPY;  Surgeon: Rogene Houston, MD;  [facility];  Service: Endoscopy;  Laterality: N/A;  930  . COLONOSCOPY N/A 02/11/2018   Procedure: COLONOSCOPY;  Surgeon: Rogene Houston, MD;  [facility];  Service: Endoscopy;  Laterality: N/A;  830  . ESOPHAGOGASTRODUODENOSCOPY N/A 12/27/2015   Procedure: ESOPHAGOGASTRODUODENOSCOPY (EGD);  Surgeon: Rogene Houston, MD;  [facility];  Service: Endoscopy;  Laterality: N/A;  3:00  . HERNIA REPAIR     umbilical hernia  . KNEE ARTHROSCOPY     left knee  . KNEE ARTHROSCOPY     right knee   . LUMBAR LAMINECTOMY/DECOMPRESSION MICRODISCECTOMY  09/14/2012   Procedure: LUMBAR LAMINECTOMY/DECOMPRESSION MICRODISCECTOMY 1 LEVEL;  Surgeon: Floyce Stakes, MD;  Location: [place] [facility];  Service: Neurosurgery;  Laterality: Right;  Right Lumbar three-four Diskectomy  . neck fusion    . POLYPECTOMY  02/11/2018   Procedure: POLYPECTOMY;  Surgeon: Rogene Houston, MD;  [facility];  Service: Endoscopy;;  colon  .  SHOULDER ARTHROSCOPY WITH BICEPSTENOTOMY Left 09/23/2013   Procedure: SHOULDER ARTHROSCOPY WITH BICEPSTENOTOMY AND EXTENSIVE DEBRIDEMENT;  Surgeon: Carole Civil, MD;  [facility];  Service: Orthopedics;  Laterality: Left;  . SHOULDER ARTHROSCOPY WITH ROTATOR CUFF REPAIR Left 05/05/2014   Procedure: SHOULDER ARTHROSCOPY LIMITED DEBRIDEMENT;  Surgeon: Carole Civil, MD;  [facility];  Service: Orthopedics;  Laterality: Left;  . SHOULDER OPEN ROTATOR CUFF REPAIR Left 05/05/2014   Procedure: ROTATOR CUFF REPAIR SHOULDER OPEN;  Surgeon: Carole Civil, MD;  [facility];  Service: Orthopedics;  Laterality: Left;  . SHOULDER SURGERY Right    Open Mumford procedure  .  SPINAL FUSION     x 2  . TOTAL KNEE ARTHROPLASTY Left 06/16/2017   Procedure: LEFT TOTAL KNEE ARTHROPLASTY;  Surgeon: Carole Civil, MD;  [facility];  Service: Orthopedics;  Laterality: Left;  . TOTAL SHOULDER ARTHROPLASTY Left 08/19/2018   Procedure: left shoulder replacement;  Surgeon: Meredith Pel, MD;  Location: [place];  Service: Orthopedics;  Laterality: Left;    There were no vitals filed for this visit.  Subjective Assessment - 09/23/18 1326    Subjective   S: My son brough his Jeep over this weekend and it needed work done so I was stupid and worked on it so it is sore.    Currently in Pain?  Yes    Pain Score  5     Pain Location  Shoulder    Pain Orientation  Left    Pain Descriptors / Indicators  Sore    Pain Type  Surgical pain    Pain Radiating Towards  N/A    Pain Onset  1 to 4 weeks ago    Pain Frequency  Constant    Aggravating Factors   working on the [place] this weekend.     Pain Relieving Factors  ice, pain medication    Effect of Pain on Daily Activities  moderate effect    Multiple Pain Sites  No         [facility] OT Assessment - 09/23/18 1328      Assessment   Medical Diagnosis  left shoulder replacement      Precautions   Precautions  Shoulder    Type of Shoulder Precautions  P/ROM Flexion to 120, abduction to 100, er to 30. isometric strengthening ok except for subscapularis.               OT Treatments/Exercises (OP) - 09/23/18 1328      Exercises   Exercises  Shoulder      Shoulder Exercises: Supine   Protraction  PROM;5 reps    Horizontal ABduction  PROM;5 reps    External Rotation  PROM;5 reps    Internal Rotation  PROM;5 reps    Flexion  PROM;5 reps    ABduction  PROM;5 reps      Shoulder Exercises: Seated   Extension  AROM;12 reps    Retraction  AROM;12 reps    Other Seated Exercises  scapular depression; 12X; A/ROM      Shoulder Exercises: Therapy Ball   Flexion  20 reps    ABduction  20 reps       Shoulder Exercises: ROM/Strengthening   Thumb Tacks  1'    Anterior Glide  3x10"    Prot/Ret//Elev/Dep  1'      Shoulder Exercises: Isometric Strengthening   Flexion  Supine   3x10"   Extension  Supine   3x10"  External Rotation  Supine   3x10"   Internal Rotation  Supine   3x10"   ABduction  Supine   3x10"   ADduction  Supine   3x10"     Manual Therapy   Manual Therapy  Soft tissue mobilization    Manual therapy comments  completed seperate from all other interventions    Soft tissue mobilization  Myofascial release and manual stretching completed to left upper arm, trapezius, and scapularis region to decrease fascial restrictions and increase joint mobility in a pain free zone.                OT Short Term Goals - 09/15/18 1422      OT SHORT TERM GOAL #1   Title  Patient will be educated and independent with HEP to faciliate progress in therapy and allow him to return to using his LUE for all daily and leisure tasks.     Time  3    Period  Weeks    Status  On-going      OT SHORT TERM GOAL #2   Title  Patient will increase P/ROM of LUE to WNL to increase ability to get his shirts on and off without need for modification technique.     Time  3    Period  Weeks    Status  On-going      OT SHORT TERM GOAL #3   Title  Patient will decrease fascial restrictions to mod amount in his LUE in order to increase functional mobility needed to complete reaching tasks.     Time  3    Period  Weeks    Status  On-going      OT SHORT TERM GOAL #4   Title  Patient will increase LUE strength to 3/5 in order to complete daily tasks below shoulder level with less difficulty.     Time  3    Period  Weeks    Status  On-going        OT Long Term Goals - 09/15/18 1422      OT LONG TERM GOAL #1   Title  Patient will return to highest level of independence while using his LUE for all daily and leisure tasks at his highest level of independence.     Time  6    Period  Weeks     Status  On-going      OT LONG TERM GOAL #2   Title  Patient will increase LUE A/ROM to WNL in order to complete overhead reaching tasks without difficulty.     Time  6    Period  Weeks    Status  On-going      OT LONG TERM GOAL #3   Title  Patient will increase LUE strength to 4+/5 to allow him to return to working on his cars without shoulder fatigue and pain.     Time  6    Period  Weeks    Status  On-going      OT LONG TERM GOAL #4   Title  Patient will decrease LUE pain to approximately 2/10 or less when completing daily and leisure tasks.     Time  6    Period  Weeks    Status  On-going      OT LONG TERM GOAL #5   Title  Patient will decrease his fascial restrictions to min amount or less in order to increase his functional mobility needed to complete activities  above shoulder level.     Time  6    Period  Weeks    Status  On-going            Plan - 09/23/18 1358    Clinical Impression Statement  A: Pt was able to achieve all required ROM parameters provided by Dr. Marlou Sa. Patient presents with max fascial restrictions in the left anterior and medial portion of shoulder with manual techniques completed to address. VC for form and technique were provided especially with pro/ret/elev/dep.     Plan  P: Continue to follow precautions. Work on decreasing fascial restrictions in the left shoulder to increase ROM and decrease pain level.     Consulted and Agree with Plan of Care  Patient       Patient will benefit from skilled therapeutic intervention in order to improve the following deficits and impairments:  Pain, Increased fascial restrictions, Decreased range of motion, Decreased strength, Impaired UE functional use  Visit Diagnosis: Other symptoms and signs involving the musculoskeletal system  Stiffness of left shoulder, not elsewhere classified  Acute pain of left shoulder    Problem List Patient Active Problem List   Diagnosis Date Noted  . Primary  osteoarthritis, left shoulder   . 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  . History of colonic polyps 11/26/2017  . S/P total knee replacement, left 06/16/17 06/16/2017  . Primary osteoarthritis of left knee   . Hyperlipidemia 09/04/2016  . Angina pectoris ([place]) 09/04/2016  . OSA (obstructive sleep apnea) 09/04/2016  . COPD (chronic obstructive pulmonary disease) ([place]) 09/04/2016  . Rotator cuff tear 05/05/2014  . S/P shoulder surgery 09/26/2013  . Arthritis, shoulder region 09/26/2013  . Bursitis, shoulder 09/26/2013  . Synovitis of shoulder 09/26/2013  . Labral tear of shoulder, degenerative 09/26/2013  . Biceps tendon tear 09/26/2013  . Rotator cuff syndrome of left shoulder 06/21/2013  . Arthritis 06/21/2013  . Patellar tendinitis 03/29/2013  . Effusion of knee joint 03/29/2013  . Bursitis/tendonitis, shoulder 03/10/2013  . Effusion of knee joint, left 08/18/2011  . Knee pain 08/18/2011  . Acute torn meniscus 07/30/2011  . Old torn meniscus of knee 07/30/2011  . ARTHRITIS, LEFT KNEE 10/15/2010  . MEDIAL MENISCUS TEAR, RIGHT 10/15/2010  . HIP PAIN 01/15/2010  . DEGENERATIVE DISC DISEASE, LUMBOSACRAL SPINE W/RADICULOPATHY 01/15/2010  . PLICA SYNDROME 85/10/7739  . DERANGEMENT MENISCUS 07/09/2009  . JOINT EFFUSION, LEFT KNEE 07/09/2009  . KNEE, ARTHRITIS, DEGEN./OSTEO 01/30/2009  . KNEE PAIN 01/30/2009  . ANKLE SPRAIN, RIGHT 11/08/2007   Ailene Ravel, OTR/L,CBIS  [phone]  09/23/2018, 2:01 PM  [place] [address] [place], [place], [address] Phone: [phone]   Fax:  [phone]  Name: Christopher [name] MRN: [id number] Date of Birth: 04-27-1956

## 2018-09-24 ENCOUNTER — Ambulatory Visit (HOSPITAL_COMMUNITY): Payer: PPO

## 2018-09-27 ENCOUNTER — Ambulatory Visit (HOSPITAL_COMMUNITY): Payer: PPO

## 2018-09-27 DIAGNOSIS — R29898 Other symptoms and signs involving the musculoskeletal system: Secondary | ICD-10-CM | POA: Diagnosis not present

## 2018-09-27 DIAGNOSIS — M25512 Pain in left shoulder: Secondary | ICD-10-CM

## 2018-09-27 DIAGNOSIS — M25612 Stiffness of left shoulder, not elsewhere classified: Secondary | ICD-10-CM

## 2018-09-27 NOTE — Therapy (Signed)
[place], [place], [address] Phone: [phone]   Fax:  [phone]  Occupational Therapy Treatment  Patient Details  Name: Christopher [name] MRN: [id number] Date of Birth: February 24, 1956 Referring Provider (OT): Dr Tonna Corner   Encounter Date: 09/27/2018  OT End of Session - 09/27/18 1411    Visit Number  4    Number of Visits  12    Date for OT Re-Evaluation  10/25/18    Authorization Type  [employer] $10 copay no visit limit    OT Start Time  1350    OT Stop Time  1430    OT Time Calculation (min)  40 min    Activity Tolerance  Patient tolerated treatment well    Behavior During Therapy  [facility] for tasks assessed/performed       Past Medical History:  Diagnosis Date  . Anxiety   . Arthritis   . Asthma   . Complication of anesthesia    pt had a hard time being able to move after spinal anesthesia , 3-4 hours  . Depression   . GERD (gastroesophageal reflux disease)   . Headache(784.0)    after surgery  . Heart murmur    Years ago- not now  . Hyperlipidemia   . IBS (irritable bowel syndrome)   . PONV (postoperative nausea and vomiting)   . Sleep apnea    uses CIPAP machine at night    Past Surgical History:  Procedure Laterality Date  . APPENDECTOMY    . BACK SURGERY     neck and back fusion  . BIOPSY  12/27/2015   Procedure: BIOPSY;  Surgeon: Rogene Houston, MD;  [facility];  Service: Endoscopy;;  Fundus biopsies and duodenal biopsies  . CARDIAC CATHETERIZATION    . CARDIAC CATHETERIZATION N/A 09/04/2016   Procedure: Right/Left Heart Cath and Coronary Angiography;  Surgeon: Peter M Martinique, MD;  Location: [place] [facility];  Service: Cardiovascular;  Laterality: N/A;  . CHOLECYSTECTOMY    . CHONDROPLASTY  08/15/2011   Procedure: CHONDROPLASTY;  Surgeon: [name], MD;  Location: [facility];  Service: Orthopedics;  Laterality: Left;  . COLONOSCOPY  06/27/2011   Procedure:  COLONOSCOPY;  Surgeon: Rogene Houston, MD;  [facility];  Service: Endoscopy;  Laterality: N/A;  9:00 / Pt to be here at 9am for 10:45 procedure, benign polyps removed  . COLONOSCOPY N/A 10/05/2014   Procedure: COLONOSCOPY;  Surgeon: Rogene Houston, MD;  [facility];  Service: Endoscopy;  Laterality: N/A;  930  . COLONOSCOPY N/A 02/11/2018   Procedure: COLONOSCOPY;  Surgeon: Rogene Houston, MD;  [facility];  Service: Endoscopy;  Laterality: N/A;  830  . ESOPHAGOGASTRODUODENOSCOPY N/A 12/27/2015   Procedure: ESOPHAGOGASTRODUODENOSCOPY (EGD);  Surgeon: Rogene Houston, MD;  [facility];  Service: Endoscopy;  Laterality: N/A;  3:00  . HERNIA REPAIR     umbilical hernia  . KNEE ARTHROSCOPY     left knee  . KNEE ARTHROSCOPY     right knee   . LUMBAR LAMINECTOMY/DECOMPRESSION MICRODISCECTOMY  09/14/2012   Procedure: LUMBAR LAMINECTOMY/DECOMPRESSION MICRODISCECTOMY 1 LEVEL;  Surgeon: Floyce Stakes, MD;  Location: [place] [facility];  Service: Neurosurgery;  Laterality: Right;  Right Lumbar three-four Diskectomy  . neck fusion    . POLYPECTOMY  02/11/2018   Procedure: POLYPECTOMY;  Surgeon: Rogene Houston, MD;  [facility];  Service: Endoscopy;;  colon  .  SHOULDER ARTHROSCOPY WITH BICEPSTENOTOMY Left 09/23/2013   Procedure: SHOULDER ARTHROSCOPY WITH BICEPSTENOTOMY AND EXTENSIVE DEBRIDEMENT;  Surgeon: Carole Civil, MD;  [facility];  Service: Orthopedics;  Laterality: Left;  . SHOULDER ARTHROSCOPY WITH ROTATOR CUFF REPAIR Left 05/05/2014   Procedure: SHOULDER ARTHROSCOPY LIMITED DEBRIDEMENT;  Surgeon: Carole Civil, MD;  [facility];  Service: Orthopedics;  Laterality: Left;  . SHOULDER OPEN ROTATOR CUFF REPAIR Left 05/05/2014   Procedure: ROTATOR CUFF REPAIR SHOULDER OPEN;  Surgeon: Carole Civil, MD;  [facility];  Service: Orthopedics;  Laterality: Left;  . SHOULDER SURGERY Right    Open Mumford procedure  .  SPINAL FUSION     x 2  . TOTAL KNEE ARTHROPLASTY Left 06/16/2017   Procedure: LEFT TOTAL KNEE ARTHROPLASTY;  Surgeon: Carole Civil, MD;  [facility]: [facility];  Service: Orthopedics;  Laterality: Left;  . TOTAL SHOULDER ARTHROPLASTY Left 08/19/2018   Procedure: left shoulder replacement;  Surgeon: Meredith Pel, MD;  Location: [facility];  Service: Orthopedics;  Laterality: Left;    There were no vitals filed for this visit.  Subjective Assessment - 09/27/18 1425    Subjective   S: it woke me up 3 times last night.     Currently in Pain?  Yes    Pain Score  4     Pain Location  Shoulder    Pain Orientation  Left    Pain Descriptors / Indicators  Sore    Pain Type  Surgical pain         [facility] OT Assessment - 09/27/18 1411      Assessment   Medical Diagnosis  left shoulder replacement      Precautions   Precautions  Shoulder    Type of Shoulder Precautions  P/ROM Flexion to 120, abduction to 100, er to 30. isometric strengthening ok except for subscapularis.               OT Treatments/Exercises (OP) - 09/27/18 0001      Exercises   Exercises  Shoulder      Shoulder Exercises: Supine   Protraction  PROM;5 reps    Horizontal ABduction  PROM;5 reps    External Rotation  PROM;5 reps    Internal Rotation  PROM;5 reps    Flexion  PROM;5 reps    ABduction  PROM;5 reps      Shoulder Exercises: Seated   Extension  AROM;12 reps    Row  AROM;12 reps    Other Seated Exercises  scapular depression; 12X; A/ROM      Shoulder Exercises: Therapy Ball   Flexion  20 reps    ABduction  20 reps      Shoulder Exercises: ROM/Strengthening   Thumb Tacks  1'    Anterior Glide  3x10"    Prot/Ret//Elev/Dep  1'      Shoulder Exercises: Isometric Strengthening   Flexion  Supine   3x10"   Extension  Supine   3x10"   External Rotation  Supine   3x10"   Internal Rotation  --   do not do per protocol   ABduction  Supine   3x10"   ADduction  Supine   3x10"     Manual  Therapy   Manual Therapy  Soft tissue mobilization    Manual therapy comments  completed seperate from all other interventions    Soft tissue mobilization  Myofascial release and manual stretching completed to left upper arm, trapezius, and scapularis region to decrease  fascial restrictions and increase joint mobility in a pain free zone.                OT Short Term Goals - 09/15/18 1422      OT SHORT TERM GOAL #1   Title  Patient will be educated and independent with HEP to faciliate progress in therapy and allow him to return to using his LUE for all daily and leisure tasks.     Time  3    Period  Weeks    Status  On-going      OT SHORT TERM GOAL #2   Title  Patient will increase P/ROM of LUE to WNL to increase ability to get his shirts on and off without need for modification technique.     Time  3    Period  Weeks    Status  On-going      OT SHORT TERM GOAL #3   Title  Patient will decrease fascial restrictions to mod amount in his LUE in order to increase functional mobility needed to complete reaching tasks.     Time  3    Period  Weeks    Status  On-going      OT SHORT TERM GOAL #4   Title  Patient will increase LUE strength to 3/5 in order to complete daily tasks below shoulder level with less difficulty.     Time  3    Period  Weeks    Status  On-going        OT Long Term Goals - 09/15/18 1422      OT LONG TERM GOAL #1   Title  Patient will return to highest level of independence while using his LUE for all daily and leisure tasks at his highest level of independence.     Time  6    Period  Weeks    Status  On-going      OT LONG TERM GOAL #2   Title  Patient will increase LUE A/ROM to WNL in order to complete overhead reaching tasks without difficulty.     Time  6    Period  Weeks    Status  On-going      OT LONG TERM GOAL #3   Title  Patient will increase LUE strength to 4+/5 to allow him to return to working on his cars without shoulder fatigue  and pain.     Time  6    Period  Weeks    Status  On-going      OT LONG TERM GOAL #4   Title  Patient will decrease LUE pain to approximately 2/10 or less when completing daily and leisure tasks.     Time  6    Period  Weeks    Status  On-going      OT LONG TERM GOAL #5   Title  Patient will decrease his fascial restrictions to min amount or less in order to increase his functional mobility needed to complete activities above shoulder level.     Time  6    Period  Weeks    Status  On-going            Plan - 09/27/18 1412    Clinical Impression Statement  A: Continued with protocol ROM restrictions. patient was able to achieve desired ROM. VC for form and technique during pro/ret/elev/dep due to decreased scapular mobility and awareness. Moderate fascial restrictions noted in left upper arm with manual techniques  completed to address.     Plan  P: Continue to follow precautions. Work on decreasing fascial restrictions in the left shoulder to increase ROM and decrease pain level.     Consulted and Agree with Plan of Care  Patient       Patient will benefit from skilled therapeutic intervention in order to improve the following deficits and impairments:  Pain, Increased fascial restrictions, Decreased range of motion, Decreased strength, Impaired UE functional use  Visit Diagnosis: Other symptoms and signs involving the musculoskeletal system  Acute pain of left shoulder  Stiffness of left shoulder, not elsewhere classified    Problem List Patient Active Problem List   Diagnosis Date Noted  . Primary osteoarthritis, left shoulder   . 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  . History of colonic polyps 11/26/2017  . S/P total knee replacement, left 06/16/17 06/16/2017  . Primary osteoarthritis of left knee   . Hyperlipidemia 09/04/2016  . Angina pectoris ([place]) 09/04/2016  . OSA (obstructive sleep apnea) 09/04/2016  . COPD (chronic obstructive pulmonary disease) ([place]) 09/04/2016   . Rotator cuff tear 05/05/2014  . S/P shoulder surgery 09/26/2013  . Arthritis, shoulder region 09/26/2013  . Bursitis, shoulder 09/26/2013  . Synovitis of shoulder 09/26/2013  . Labral tear of shoulder, degenerative 09/26/2013  . Biceps tendon tear 09/26/2013  . Rotator cuff syndrome of left shoulder 06/21/2013  . Arthritis 06/21/2013  . Patellar tendinitis 03/29/2013  . Effusion of knee joint 03/29/2013  . Bursitis/tendonitis, shoulder 03/10/2013  . Effusion of knee joint, left 08/18/2011  . Knee pain 08/18/2011  . Acute torn meniscus 07/30/2011  . Old torn meniscus of knee 07/30/2011  . ARTHRITIS, LEFT KNEE 10/15/2010  . MEDIAL MENISCUS TEAR, RIGHT 10/15/2010  . HIP PAIN 01/15/2010  . DEGENERATIVE DISC DISEASE, LUMBOSACRAL SPINE W/RADICULOPATHY 01/15/2010  . PLICA SYNDROME 50/75/7322  . DERANGEMENT MENISCUS 07/09/2009  . JOINT EFFUSION, LEFT KNEE 07/09/2009  . KNEE, ARTHRITIS, DEGEN./OSTEO 01/30/2009  . KNEE PAIN 01/30/2009  . ANKLE SPRAIN, RIGHT 11/08/2007   Ailene Ravel, OTR/L,CBIS  [phone]  09/27/2018, 2:28 PM  [facility] [address] [place], [place], [address] Phone: [phone]   Fax:  [phone]  Name: Christopher [name] MRN: [id number] Date of Birth: 02/19/1956

## 2018-09-29 ENCOUNTER — Ambulatory Visit (HOSPITAL_COMMUNITY): Payer: PPO | Admitting: Occupational Therapy

## 2018-09-29 ENCOUNTER — Encounter (HOSPITAL_COMMUNITY): Payer: Self-pay | Admitting: Occupational Therapy

## 2018-09-29 DIAGNOSIS — M25612 Stiffness of left shoulder, not elsewhere classified: Secondary | ICD-10-CM

## 2018-09-29 DIAGNOSIS — R29898 Other symptoms and signs involving the musculoskeletal system: Secondary | ICD-10-CM

## 2018-09-29 DIAGNOSIS — M25512 Pain in left shoulder: Secondary | ICD-10-CM

## 2018-09-29 NOTE — Therapy (Signed)
[place], [place], [address] Phone: [phone]   Fax:  [phone]  Occupational Therapy Treatment  Patient Details  Name: Christopher [name] MRN: [id number] Date of Birth: February 28, 1956 Referring Provider (OT): Dr Tonna Corner   Encounter Date: 09/29/2018  OT End of Session - 09/29/18 1423    Visit Number  5    Number of Visits  12    Date for OT Re-Evaluation  10/25/18    Authorization Type  [employer] $10 copay no visit limit    OT Start Time  1348    OT Stop Time  1429    OT Time Calculation (min)  41 min    Activity Tolerance  Patient tolerated treatment well    Behavior During Therapy  [facility] for tasks assessed/performed       Past Medical History:  Diagnosis Date  . Anxiety   . Arthritis   . Asthma   . Complication of anesthesia    pt had a hard time being able to move after spinal anesthesia , 3-4 hours  . Depression   . GERD (gastroesophageal reflux disease)   . Headache(784.0)    after surgery  . Heart murmur    Years ago- not now  . Hyperlipidemia   . IBS (irritable bowel syndrome)   . PONV (postoperative nausea and vomiting)   . Sleep apnea    uses CIPAP machine at night    Past Surgical History:  Procedure Laterality Date  . APPENDECTOMY    . BACK SURGERY     neck and back fusion  . BIOPSY  12/27/2015   Procedure: BIOPSY;  Surgeon: Rogene Houston, MD;  [facility];  Service: Endoscopy;;  Fundus biopsies and duodenal biopsies  . CARDIAC CATHETERIZATION    . CARDIAC CATHETERIZATION N/A 09/04/2016   Procedure: Right/Left Heart Cath and Coronary Angiography;  Surgeon: Peter M Martinique, MD;  Location: [place] CV LAB;  Service: Cardiovascular;  Laterality: N/A;  . CHOLECYSTECTOMY    . CHONDROPLASTY  08/15/2011   Procedure: CHONDROPLASTY;  Surgeon: [name], MD;  [facility];  Service: Orthopedics;  Laterality: Left;  . COLONOSCOPY  06/27/2011   Procedure:  COLONOSCOPY;  Surgeon: Rogene Houston, MD;  [facility];  Service: Endoscopy;  Laterality: N/A;  9:00 / Pt to be here at 9am for 10:45 procedure, benign polyps removed  . COLONOSCOPY N/A 10/05/2014   Procedure: COLONOSCOPY;  Surgeon: Rogene Houston, MD;  [facility];  Service: Endoscopy;  Laterality: N/A;  930  . COLONOSCOPY N/A 02/11/2018   Procedure: COLONOSCOPY;  Surgeon: Rogene Houston, MD;  [facility];  Service: Endoscopy;  Laterality: N/A;  830  . ESOPHAGOGASTRODUODENOSCOPY N/A 12/27/2015   Procedure: ESOPHAGOGASTRODUODENOSCOPY (EGD);  Surgeon: Rogene Houston, MD;  [facility];  Service: Endoscopy;  Laterality: N/A;  3:00  . HERNIA REPAIR     umbilical hernia  . KNEE ARTHROSCOPY     left knee  . KNEE ARTHROSCOPY     right knee   . LUMBAR LAMINECTOMY/DECOMPRESSION MICRODISCECTOMY  09/14/2012   Procedure: LUMBAR LAMINECTOMY/DECOMPRESSION MICRODISCECTOMY 1 LEVEL;  Surgeon: Floyce Stakes, MD;  Location: [place] [facility] OR[facility];  Service: Neurosurgery;  Laterality: Right;  Right Lumbar three-four Diskectomy  . neck fusion    . POLYPECTOMY  02/11/2018   Procedure: POLYPECTOMY;  Surgeon: Rogene Houston, MD;  [facility];  Service: Endoscopy;;  colon  .  SHOULDER ARTHROSCOPY WITH BICEPSTENOTOMY Left 09/23/2013   Procedure: SHOULDER ARTHROSCOPY WITH BICEPSTENOTOMY AND EXTENSIVE DEBRIDEMENT;  Surgeon: Carole Civil, MD;  [facility];  Service: Orthopedics;  Laterality: Left;  . SHOULDER ARTHROSCOPY WITH ROTATOR CUFF REPAIR Left 05/05/2014   Procedure: SHOULDER ARTHROSCOPY LIMITED DEBRIDEMENT;  Surgeon: Carole Civil, MD;  [facility];  Service: Orthopedics;  Laterality: Left;  . SHOULDER OPEN ROTATOR CUFF REPAIR Left 05/05/2014   Procedure: ROTATOR CUFF REPAIR SHOULDER OPEN;  Surgeon: Carole Civil, MD;  [facility]: [facility];  Service: Orthopedics;  Laterality: Left;  . SHOULDER SURGERY Right    Open Mumford procedure  .  SPINAL FUSION     x 2  . TOTAL KNEE ARTHROPLASTY Left 06/16/2017   Procedure: LEFT TOTAL KNEE ARTHROPLASTY;  Surgeon: Carole Civil, MD;  [facility]: [facility];  Service: Orthopedics;  Laterality: Left;  . TOTAL SHOULDER ARTHROPLASTY Left 08/19/2018   Procedure: left shoulder replacement;  Surgeon: Meredith Pel, MD;  Location: [facility];  Service: Orthopedics;  Laterality: Left;    There were no vitals filed for this visit.  Subjective Assessment - 09/29/18 1348    Subjective   S: I put some heat on it yesterday and that helped.     Currently in Pain?  Yes    Pain Score  5     Pain Location  Shoulder    Pain Orientation  Left    Pain Descriptors / Indicators  Sore    Pain Type  Acute pain    Pain Radiating Towards  N/A    Pain Onset  1 to 4 weeks ago    Pain Frequency  Constant    Aggravating Factors   movement, trying to sleep    Pain Relieving Factors  ice, pain medication, heat    Effect of Pain on Daily Activities  moderate effect on ADLs    Multiple Pain Sites  No         [facility] OT Assessment - 09/29/18 1348      Assessment   Medical Diagnosis  left shoulder replacement      Precautions   Precautions  Shoulder    Type of Shoulder Precautions  P/ROM Flexion to 120, abduction to 100, er to 30. isometric strengthening ok except for subscapularis.               OT Treatments/Exercises (OP) - 09/29/18 1352      Exercises   Exercises  Shoulder      Shoulder Exercises: Supine   Protraction  PROM;5 reps    Horizontal ABduction  PROM;5 reps    External Rotation  PROM;5 reps    Internal Rotation  PROM;5 reps    Flexion  PROM;5 reps    ABduction  PROM;5 reps      Shoulder Exercises: Seated   Extension  AROM;15 reps    Row  AROM;15 reps    Other Seated Exercises  scapular depression; 15X; A/ROM      Shoulder Exercises: Therapy Ball   Flexion  20 reps    ABduction  20 reps      Shoulder Exercises: ROM/Strengthening   Thumb Tacks  1'     Prot/Ret//Elev/Dep  1'      Shoulder Exercises: Isometric Strengthening   Flexion  --   standing 3x10"   Extension  --   standing 3x10"   External Rotation  --   standing 3x10"   Internal Rotation  --   do not complete  per protocol   ABduction  --   standing 3x10"   ADduction  --   standing 3x10"     Manual Therapy   Manual Therapy  Soft tissue mobilization    Manual therapy comments  completed seperate from all other interventions    Soft tissue mobilization  Myofascial release and manual stretching completed to left upper arm, trapezius, and scapularis region to decrease fascial restrictions and increase joint mobility in a pain free zone.                OT Short Term Goals - 09/15/18 1422      OT SHORT TERM GOAL #1   Title  Patient will be educated and independent with HEP to faciliate progress in therapy and allow him to return to using his LUE for all daily and leisure tasks.     Time  3    Period  Weeks    Status  On-going      OT SHORT TERM GOAL #2   Title  Patient will increase P/ROM of LUE to WNL to increase ability to get his shirts on and off without need for modification technique.     Time  3    Period  Weeks    Status  On-going      OT SHORT TERM GOAL #3   Title  Patient will decrease fascial restrictions to mod amount in his LUE in order to increase functional mobility needed to complete reaching tasks.     Time  3    Period  Weeks    Status  On-going      OT SHORT TERM GOAL #4   Title  Patient will increase LUE strength to 3/5 in order to complete daily tasks below shoulder level with less difficulty.     Time  3    Period  Weeks    Status  On-going        OT Long Term Goals - 09/15/18 1422      OT LONG TERM GOAL #1   Title  Patient will return to highest level of independence while using his LUE for all daily and leisure tasks at his highest level of independence.     Time  6    Period  Weeks    Status  On-going      OT LONG TERM  GOAL #2   Title  Patient will increase LUE A/ROM to WNL in order to complete overhead reaching tasks without difficulty.     Time  6    Period  Weeks    Status  On-going      OT LONG TERM GOAL #3   Title  Patient will increase LUE strength to 4+/5 to allow him to return to working on his cars without shoulder fatigue and pain.     Time  6    Period  Weeks    Status  On-going      OT LONG TERM GOAL #4   Title  Patient will decrease LUE pain to approximately 2/10 or less when completing daily and leisure tasks.     Time  6    Period  Weeks    Status  On-going      OT LONG TERM GOAL #5   Title  Patient will decrease his fascial restrictions to min amount or less in order to increase his functional mobility needed to complete activities above shoulder level.     Time  6  Period  Weeks    Status  On-going            Plan - 09/29/18 1420    Clinical Impression Statement  A: Continued with protocol restrictions. Manual therapy completed at beginning of session to decrease fascial restrictions and pain in upper arm. Passive er stretch with increased discomfort today, able to reach protocol limits. Completed isometrics in standing today, verbal cuing for form during exercises.     Plan  P: Continue with protocol precautions and continue working to decrease fascial restrictions in LUE to improve ROM and decrease pain       Patient will benefit from skilled therapeutic intervention in order to improve the following deficits and impairments:  Pain, Increased fascial restrictions, Decreased range of motion, Decreased strength, Impaired UE functional use  Visit Diagnosis: Other symptoms and signs involving the musculoskeletal system  Acute pain of left shoulder  Stiffness of left shoulder, not elsewhere classified    Problem List Patient Active Problem List   Diagnosis Date Noted  . Primary osteoarthritis, left shoulder   . 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  . History of colonic  polyps 11/26/2017  . S/P total knee replacement, left 06/16/17 06/16/2017  . Primary osteoarthritis of left knee   . Hyperlipidemia 09/04/2016  . Angina pectoris ([place]) 09/04/2016  . OSA (obstructive sleep apnea) 09/04/2016  . COPD (chronic obstructive pulmonary disease) ([place]) 09/04/2016  . Rotator cuff tear 05/05/2014  . S/P shoulder surgery 09/26/2013  . Arthritis, shoulder region 09/26/2013  . Bursitis, shoulder 09/26/2013  . Synovitis of shoulder 09/26/2013  . Labral tear of shoulder, degenerative 09/26/2013  . Biceps tendon tear 09/26/2013  . Rotator cuff syndrome of left shoulder 06/21/2013  . Arthritis 06/21/2013  . Patellar tendinitis 03/29/2013  . Effusion of knee joint 03/29/2013  . Bursitis/tendonitis, shoulder 03/10/2013  . Effusion of knee joint, left 08/18/2011  . Knee pain 08/18/2011  . Acute torn meniscus 07/30/2011  . Old torn meniscus of knee 07/30/2011  . ARTHRITIS, LEFT KNEE 10/15/2010  . MEDIAL MENISCUS TEAR, RIGHT 10/15/2010  . HIP PAIN 01/15/2010  . DEGENERATIVE DISC DISEASE, LUMBOSACRAL SPINE W/RADICULOPATHY 01/15/2010  . PLICA SYNDROME 65/78/4696  . DERANGEMENT MENISCUS 07/09/2009  . JOINT EFFUSION, LEFT KNEE 07/09/2009  . KNEE, ARTHRITIS, DEGEN./OSTEO 01/30/2009  . KNEE PAIN 01/30/2009  . ANKLE SPRAIN, RIGHT 11/08/2007   Guadelupe Sabin, OTR/L  [phone] 09/29/2018, 3:07 PM  [facility] [address] [place], [place], [address] Phone: [phone]   Fax:  [phone]  Name: Christopher [name] MRN: [id number] Date of Birth: [birth year]

## 2018-09-30 ENCOUNTER — Other Ambulatory Visit (INDEPENDENT_AMBULATORY_CARE_PROVIDER_SITE_OTHER): Payer: Self-pay | Admitting: Internal Medicine

## 2018-10-04 ENCOUNTER — Ambulatory Visit (HOSPITAL_COMMUNITY): Payer: PPO | Attending: Orthopedic Surgery | Admitting: Specialist

## 2018-10-04 ENCOUNTER — Encounter (HOSPITAL_COMMUNITY): Payer: Self-pay | Admitting: Specialist

## 2018-10-04 DIAGNOSIS — M25612 Stiffness of left shoulder, not elsewhere classified: Secondary | ICD-10-CM | POA: Insufficient documentation

## 2018-10-04 DIAGNOSIS — R29898 Other symptoms and signs involving the musculoskeletal system: Secondary | ICD-10-CM | POA: Diagnosis not present

## 2018-10-04 DIAGNOSIS — M25512 Pain in left shoulder: Secondary | ICD-10-CM | POA: Diagnosis not present

## 2018-10-04 NOTE — Therapy (Signed)
[place], [place], [address] Phone: [phone]   Fax:  [phone]  Occupational Therapy Treatment  Patient Details  Name: Christopher [name] MRN: [id number] Date of Birth: [birth year] Referring Provider (OT): Dr Tonna Corner   Encounter Date: 10/04/2018  OT End of Session - 10/04/18 1644    Visit Number  6    Number of Visits  12    Date for OT Re-Evaluation  10/25/18    Authorization Type  [employer] $10 copay no visit limit    OT Start Time  1525    OT Stop Time  8315    OT Time Calculation (min)  39 min       Past Medical History:  Diagnosis Date  . Anxiety   . Arthritis   . Asthma   . Complication of anesthesia    pt had a hard time being able to move after spinal anesthesia , 3-4 hours  . Depression   . GERD (gastroesophageal reflux disease)   . Headache(784.0)    after surgery  . Heart murmur    Years ago- not now  . Hyperlipidemia   . IBS (irritable bowel syndrome)   . PONV (postoperative nausea and vomiting)   . Sleep apnea    uses CIPAP machine at night    Past Surgical History:  Procedure Laterality Date  . APPENDECTOMY    . BACK SURGERY     neck and back fusion  . BIOPSY  12/27/2015   Procedure: BIOPSY;  Surgeon: Rogene Houston, MD;  [facility];  Service: Endoscopy;;  Fundus biopsies and duodenal biopsies  . CARDIAC CATHETERIZATION    . CARDIAC CATHETERIZATION N/A 09/04/2016   Procedure: Right/Left Heart Cath and Coronary Angiography;  Surgeon: Peter M Martinique, MD;  Location: [place] [facility];  Service: Cardiovascular;  Laterality: N/A;  . CHOLECYSTECTOMY    . CHONDROPLASTY  08/15/2011   Procedure: CHONDROPLASTY;  Surgeon: [name], MD;  Location: [facility];  Service: Orthopedics;  Laterality: Left;  . COLONOSCOPY  06/27/2011   Procedure: COLONOSCOPY;  Surgeon: Rogene Houston, MD;  [facility];  Service: Endoscopy;  Laterality: N/A;  9:00 / Pt  to be here at 9am for 10:45 procedure, benign polyps removed  . COLONOSCOPY N/A 10/05/2014   Procedure: COLONOSCOPY;  Surgeon: Rogene Houston, MD;  [facility];  Service: Endoscopy;  Laterality: N/A;  930  . COLONOSCOPY N/A 02/11/2018   Procedure: COLONOSCOPY;  Surgeon: Rogene Houston, MD;  [facility];  Service: Endoscopy;  Laterality: N/A;  830  . ESOPHAGOGASTRODUODENOSCOPY N/A 12/27/2015   Procedure: ESOPHAGOGASTRODUODENOSCOPY (EGD);  Surgeon: Rogene Houston, MD;  [facility];  Service: Endoscopy;  Laterality: N/A;  3:00  . HERNIA REPAIR     umbilical hernia  . KNEE ARTHROSCOPY     left knee  . KNEE ARTHROSCOPY     right knee   . LUMBAR LAMINECTOMY/DECOMPRESSION MICRODISCECTOMY  09/14/2012   Procedure: LUMBAR LAMINECTOMY/DECOMPRESSION MICRODISCECTOMY 1 LEVEL;  Surgeon: Floyce Stakes, MD;  Location: [facility];  Service: Neurosurgery;  Laterality: Right;  Right Lumbar three-four Diskectomy  . neck fusion    . POLYPECTOMY  02/11/2018   Procedure: POLYPECTOMY;  Surgeon: Rogene Houston, MD;  [facility];  Service: Endoscopy;;  colon  . SHOULDER ARTHROSCOPY WITH BICEPSTENOTOMY Left 09/23/2013   Procedure: SHOULDER ARTHROSCOPY WITH BICEPSTENOTOMY AND EXTENSIVE DEBRIDEMENT;  Surgeon: Dorothyann Peng  Vela Prose, MD;  [facility];  Service: Orthopedics;  Laterality: Left;  . SHOULDER ARTHROSCOPY WITH ROTATOR CUFF REPAIR Left 05/05/2014   Procedure: SHOULDER ARTHROSCOPY LIMITED DEBRIDEMENT;  Surgeon: Carole Civil, MD;  [facility];  Service: Orthopedics;  Laterality: Left;  . SHOULDER OPEN ROTATOR CUFF REPAIR Left 05/05/2014   Procedure: ROTATOR CUFF REPAIR SHOULDER OPEN;  Surgeon: Carole Civil, MD;  [facility];  Service: Orthopedics;  Laterality: Left;  . SHOULDER SURGERY Right    Open Mumford procedure  . SPINAL FUSION     x 2  . TOTAL KNEE ARTHROPLASTY Left 06/16/2017   Procedure: LEFT TOTAL KNEE ARTHROPLASTY;  Surgeon:  Carole Civil, MD;  [facility];  Service: Orthopedics;  Laterality: Left;  . TOTAL SHOULDER ARTHROPLASTY Left 08/19/2018   Procedure: left shoulder replacement;  Surgeon: Meredith Pel, MD;  Location: [facility];  Service: Orthopedics;  Laterality: Left;    There were no vitals filed for this visit.  Subjective Assessment - 10/04/18 1644    Subjective   S:  I have been taking my sling off.  I like to ice my arm.     Currently in Pain?  Yes    Pain Score  5     Pain Location  Shoulder    Pain Orientation  Left    Pain Descriptors / Indicators  Aching;Sore         [facility] OT Assessment - 10/04/18 0001      Assessment   Medical Diagnosis  left shoulder replacement      Precautions   Precautions  Shoulder    Type of Shoulder Precautions  P/ROM Flexion to 120, abduction to 100, er to 30. isometric strengthening ok except for subscapularis.               OT Treatments/Exercises (OP) - 10/04/18 0001      Exercises   Exercises  Elbow;Shoulder      Shoulder Exercises: Supine   Protraction  PROM;5 reps    Horizontal ABduction  PROM;5 reps    External Rotation  PROM;5 reps    Internal Rotation  PROM;5 reps    Flexion  PROM;5 reps    ABduction  PROM;5 reps      Shoulder Exercises: Seated   Elevation  AROM;20 reps    Extension  AROM;20 reps    Row  AROM;20 reps      Shoulder Exercises: Therapy Ball   Flexion  20 reps    ABduction  20 reps      Shoulder Exercises: ROM/Strengthening   Thumb Tacks  1'    Prot/Ret//Elev/Dep  1'      Shoulder Exercises: Isometric Strengthening   Flexion  --   standing 3X 15"   Extension  --   standing 3 X 15"   External Rotation  --   standing 3X 15"   Internal Rotation  --   do not complete    ABduction  --   standing 3 X 15"   ADduction  --   standking 3 X 15"      Manual Therapy   Manual Therapy  Myofascial release    Manual therapy comments  completed seperate from all other interventions    Soft tissue  mobilization  Myofascial release and manual stretching completed to left upper arm, trapezius, and scapularis region to decrease fascial restrictions and increase joint mobility in a pain free zone.  OT Short Term Goals - 09/15/18 1422      OT SHORT TERM GOAL #1   Title  Patient will be educated and independent with HEP to faciliate progress in therapy and allow him to return to using his LUE for all daily and leisure tasks.     Time  3    Period  Weeks    Status  On-going      OT SHORT TERM GOAL #2   Title  Patient will increase P/ROM of LUE to WNL to increase ability to get his shirts on and off without need for modification technique.     Time  3    Period  Weeks    Status  On-going      OT SHORT TERM GOAL #3   Title  Patient will decrease fascial restrictions to mod amount in his LUE in order to increase functional mobility needed to complete reaching tasks.     Time  3    Period  Weeks    Status  On-going      OT SHORT TERM GOAL #4   Title  Patient will increase LUE strength to 3/5 in order to complete daily tasks below shoulder level with less difficulty.     Time  3    Period  Weeks    Status  On-going        OT Long Term Goals - 09/15/18 1422      OT LONG TERM GOAL #1   Title  Patient will return to highest level of independence while using his LUE for all daily and leisure tasks at his highest level of independence.     Time  6    Period  Weeks    Status  On-going      OT LONG TERM GOAL #2   Title  Patient will increase LUE A/ROM to WNL in order to complete overhead reaching tasks without difficulty.     Time  6    Period  Weeks    Status  On-going      OT LONG TERM GOAL #3   Title  Patient will increase LUE strength to 4+/5 to allow him to return to working on his cars without shoulder fatigue and pain.     Time  6    Period  Weeks    Status  On-going      OT LONG TERM GOAL #4   Title  Patient will decrease LUE pain to  approximately 2/10 or less when completing daily and leisure tasks.     Time  6    Period  Weeks    Status  On-going      OT LONG TERM GOAL #5   Title  Patient will decrease his fascial restrictions to min amount or less in order to increase his functional mobility needed to complete activities above shoulder level.     Time  6    Period  Weeks    Status  On-going            Plan - 10/04/18 1645    Clinical Impression Statement  A:  Patient able to tolerate P/ROM within protocol parameters.  increased contraction time to 15" with isometric strengthening.       Plan  P:  Reassess for MD appointment.  add pulleys.       Patient will benefit from skilled therapeutic intervention in order to improve the following deficits and impairments:  Pain, Increased fascial restrictions, Decreased  range of motion, Decreased strength, Impaired UE functional use  Visit Diagnosis: Other symptoms and signs involving the musculoskeletal system  Acute pain of left shoulder  Stiffness of left shoulder, not elsewhere classified    Problem List Patient Active Problem List   Diagnosis Date Noted  . Primary osteoarthritis, left shoulder   . 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  . History of colonic polyps 11/26/2017  . S/P total knee replacement, left 06/16/17 06/16/2017  . Primary osteoarthritis of left knee   . Hyperlipidemia 09/04/2016  . Angina pectoris ([place]) 09/04/2016  . OSA (obstructive sleep apnea) 09/04/2016  . COPD (chronic obstructive pulmonary disease) ([place]) 09/04/2016  . Rotator cuff tear 05/05/2014  . S/P shoulder surgery 09/26/2013  . Arthritis, shoulder region 09/26/2013  . Bursitis, shoulder 09/26/2013  . Synovitis of shoulder 09/26/2013  . Labral tear of shoulder, degenerative 09/26/2013  . Biceps tendon tear 09/26/2013  . Rotator cuff syndrome of left shoulder 06/21/2013  . Arthritis 06/21/2013  . Patellar tendinitis 03/29/2013  . Effusion of knee joint 03/29/2013  .  Bursitis/tendonitis, shoulder 03/10/2013  . Effusion of knee joint, left 08/18/2011  . Knee pain 08/18/2011  . Acute torn meniscus 07/30/2011  . Old torn meniscus of knee 07/30/2011  . ARTHRITIS, LEFT KNEE 10/15/2010  . MEDIAL MENISCUS TEAR, RIGHT 10/15/2010  . HIP PAIN 01/15/2010  . DEGENERATIVE DISC DISEASE, LUMBOSACRAL SPINE W/RADICULOPATHY 01/15/2010  . PLICA SYNDROME 63/84/6659  . DERANGEMENT MENISCUS 07/09/2009  . JOINT EFFUSION, LEFT KNEE 07/09/2009  . KNEE, ARTHRITIS, DEGEN./OSTEO 01/30/2009  . KNEE PAIN 01/30/2009  . ANKLE SPRAIN, RIGHT 11/08/2007    Vangie Bicker, Jenkins, OTR/L [phone]  10/04/2018, 4:50 PM  [place], [place], [address] Phone: [phone]   Fax:  [phone]  Name: Christopher [name] MRN: [id number] Date of Birth: 08/23/1956

## 2018-10-04 NOTE — Telephone Encounter (Signed)
Patient will need to have office visit prior to further refills.

## 2018-10-06 ENCOUNTER — Encounter (HOSPITAL_COMMUNITY): Payer: Self-pay | Admitting: Specialist

## 2018-10-06 ENCOUNTER — Ambulatory Visit (HOSPITAL_COMMUNITY): Payer: PPO | Admitting: Specialist

## 2018-10-06 DIAGNOSIS — R29898 Other symptoms and signs involving the musculoskeletal system: Secondary | ICD-10-CM | POA: Diagnosis not present

## 2018-10-06 DIAGNOSIS — M25512 Pain in left shoulder: Secondary | ICD-10-CM

## 2018-10-06 DIAGNOSIS — M25612 Stiffness of left shoulder, not elsewhere classified: Secondary | ICD-10-CM

## 2018-10-06 NOTE — Therapy (Signed)
[facility] [place] [place], [place], [address] Phone: [phone]   Fax:  [phone]  Occupational Therapy Treatment  Progress note Progress Note Reporting Period 09/13/18 to 10/06/2018  See note below for Objective Data and Assessment of Progress/Goals.       Patient Details  Name: Christopher [name] MRN: [id number] Date of Birth: 1956/04/13 Referring Provider (OT): Dr Meredith Pel   Encounter Date: 10/06/2018  OT End of Session - 10/06/18 1621    Visit Number  7    Number of Visits  12    Date for OT Re-Evaluation  10/25/18    Authorization Type  [employer] $10 copay no visit limit    OT Start Time  1607    OT Stop Time  1647    OT Time Calculation (min)  40 min    Activity Tolerance  Patient tolerated treatment well    Behavior During Therapy  [facility] for tasks assessed/performed       Past Medical History:  Diagnosis Date  . Anxiety   . Arthritis   . Asthma   . Complication of anesthesia    pt had a hard time being able to move after spinal anesthesia , 3-4 hours  . Depression   . GERD (gastroesophageal reflux disease)   . Headache(784.0)    after surgery  . Heart murmur    Years ago- not now  . Hyperlipidemia   . IBS (irritable bowel syndrome)   . PONV (postoperative nausea and vomiting)   . Sleep apnea    uses CIPAP machine at night    Past Surgical History:  Procedure Laterality Date  . APPENDECTOMY    . BACK SURGERY     neck and back fusion  . BIOPSY  12/27/2015   Procedure: BIOPSY;  Surgeon: Rogene Houston, MD;  [facility];  Service: Endoscopy;;  Fundus biopsies and duodenal biopsies  . CARDIAC CATHETERIZATION    . CARDIAC CATHETERIZATION N/A 09/04/2016   Procedure: Right/Left Heart Cath and Coronary Angiography;  Surgeon: Peter M Martinique, MD;  Location: [facility];  Service: Cardiovascular;  Laterality: N/A;  . CHOLECYSTECTOMY    . CHONDROPLASTY  08/15/2011   Procedure: CHONDROPLASTY;  Surgeon: [name], MD;  [facility];  Service: Orthopedics;  Laterality: Left;  . COLONOSCOPY  06/27/2011   Procedure: COLONOSCOPY;  Surgeon: Rogene Houston, MD;  [facility];  Service: Endoscopy;  Laterality: N/A;  9:00 / Pt to be here at 9am for 10:45 procedure, benign polyps removed  . COLONOSCOPY N/A 10/05/2014   Procedure: COLONOSCOPY;  Surgeon: Rogene Houston, MD;  [facility];  Service: Endoscopy;  Laterality: N/A;  930  . COLONOSCOPY N/A 02/11/2018   Procedure: COLONOSCOPY;  Surgeon: Rogene Houston, MD;  [facility];  Service: Endoscopy;  Laterality: N/A;  830  . ESOPHAGOGASTRODUODENOSCOPY N/A 12/27/2015   Procedure: ESOPHAGOGASTRODUODENOSCOPY (EGD);  Surgeon: Rogene Houston, MD;  [facility];  Service: Endoscopy;  Laterality: N/A;  3:00  . HERNIA REPAIR     umbilical hernia  . KNEE ARTHROSCOPY     left knee  . KNEE ARTHROSCOPY     right knee   . LUMBAR LAMINECTOMY/DECOMPRESSION MICRODISCECTOMY  09/14/2012   Procedure: LUMBAR LAMINECTOMY/DECOMPRESSION MICRODISCECTOMY 1 LEVEL;  Surgeon: Floyce Stakes, MD;  Location: [facility];  Service: Neurosurgery;  Laterality: Right;  Right Lumbar three-four Diskectomy  . neck fusion    .  POLYPECTOMY  02/11/2018   Procedure: POLYPECTOMY;  Surgeon: Rogene Houston, MD;  [facility];  Service: Endoscopy;;  colon  . SHOULDER ARTHROSCOPY WITH BICEPSTENOTOMY Left 09/23/2013   Procedure: SHOULDER ARTHROSCOPY WITH BICEPSTENOTOMY AND EXTENSIVE DEBRIDEMENT;  Surgeon: Carole Civil, MD;  [facility];  Service: Orthopedics;  Laterality: Left;  . SHOULDER ARTHROSCOPY WITH ROTATOR CUFF REPAIR Left 05/05/2014   Procedure: SHOULDER ARTHROSCOPY LIMITED DEBRIDEMENT;  Surgeon: Carole Civil, MD;  [facility];  Service: Orthopedics;  Laterality: Left;  . SHOULDER OPEN ROTATOR CUFF REPAIR Left 05/05/2014   Procedure: ROTATOR CUFF REPAIR SHOULDER  OPEN;  Surgeon: Carole Civil, MD;  [facility];  Service: Orthopedics;  Laterality: Left;  . SHOULDER SURGERY Right    Open Mumford procedure  . SPINAL FUSION     x 2  . TOTAL KNEE ARTHROPLASTY Left 06/16/2017   Procedure: LEFT TOTAL KNEE ARTHROPLASTY;  Surgeon: Carole Civil, MD;  [facility];  Service: Orthopedics;  Laterality: Left;  . TOTAL SHOULDER ARTHROPLASTY Left 08/19/2018   Procedure: left shoulder replacement;  Surgeon: Meredith Pel, MD;  Location: [facility];  Service: Orthopedics;  Laterality: Left;    There were no vitals filed for this visit.  Subjective Assessment - 10/06/18 1607    Subjective   S:  I have been painting all day with my left arm today, it is sore.     Currently in Pain?  Yes    Pain Score  5     Pain Location  Shoulder    Pain Orientation  Left    Pain Descriptors / Indicators  Aching;Sore    Pain Type  Acute pain         [facility] OT Assessment - 10/06/18 0001      Assessment   Medical Diagnosis  left shoulder replacement    Referring Provider (OT)  Dr Meredith Pel      Precautions   Precautions  Shoulder    Type of Shoulder Precautions  P/ROM Flexion to 120, abduction to 100, er to 34. isometric strengthening ok except for subscapularis.      ADL   ADL comments  Patient is able to retrieve drinks from fridge and reaching to shoulder height, pushing self out of bed, fold clothes, wash dishes.  Patient continues to have difficulty reaching overhead, lifitng items ovehread, reaching behind his back, and completing work activities.        Observation/Other Assessments   Focus on Therapeutic Outcomes (FOTO)   63/100      Palpation   Palpation comment  moderate fascial restrictions       AROM   Overall AROM   Unable to assess;Due to precautions      PROM   Overall PROM Comments  Assessed supine. IR/er adducted.    Left Shoulder Flexion  155 Degrees   90   Left Shoulder ABduction  125 Degrees   180   Left  Shoulder Internal Rotation  90 Degrees   90   Left Shoulder External Rotation  52 Degrees   10     Strength   Overall Strength  Unable to assess;Due to precautions               OT Treatments/Exercises (OP) - 10/06/18 0001      Exercises   Exercises  Shoulder      Shoulder Exercises: Supine   Protraction  PROM;5 reps    Horizontal ABduction  PROM;5 reps  External Rotation  PROM;5 reps    Internal Rotation  PROM;5 reps    Flexion  PROM;5 reps    ABduction  PROM;5 reps      Shoulder Exercises: Seated   Elevation  AROM;20 reps    Extension  AROM;20 reps    Row  AROM;20 reps      Shoulder Exercises: Pulleys   Flexion  1 minute    ABduction  1 minute      Shoulder Exercises: Therapy Ball   Flexion  20 reps    ABduction  20 reps      Shoulder Exercises: ROM/Strengthening   Thumb Tacks  1'    Prot/Ret//Elev/Dep  1'      Shoulder Exercises: Isometric Strengthening   Flexion  --   standing 3 X 20"   Extension  --   standing 3 X 20"   External Rotation  --   standing 3 X 20"   Internal Rotation  --   do not complete   ABduction  --   3X20"   ADduction  --   standking 3 X 20"     Manual Therapy   Manual Therapy  Myofascial release    Manual therapy comments  completed seperate from all other interventions    Soft tissue mobilization  Myofascial release and manual stretching completed to left upper arm, trapezius, and scapularis region to decrease fascial restrictions and increase joint mobility in a pain free zone.                OT Short Term Goals - 10/06/18 1640      OT SHORT TERM GOAL #1   Title  Patient will be educated and independent with HEP to faciliate progress in therapy and allow him to return to using his LUE for all daily and leisure tasks.     Time  3    Period  Weeks    Status  On-going      OT SHORT TERM GOAL #2   Title  Patient will increase P/ROM of LUE to WNL to increase ability to get his shirts on and off without need  for modification technique.     Time  3    Period  Weeks    Status  Partially Met      OT SHORT TERM GOAL #3   Title  Patient will decrease fascial restrictions to mod amount in his LUE in order to increase functional mobility needed to complete reaching tasks.     Time  3    Period  Weeks    Status  Achieved      OT SHORT TERM GOAL #4   Title  Patient will increase LUE strength to 3/5 in order to complete daily tasks below shoulder level with less difficulty.     Time  3    Period  Weeks    Status  On-going        OT Long Term Goals - 10/06/18 1642      OT LONG TERM GOAL #1   Title  Patient will return to highest level of independence while using his LUE for all daily and leisure tasks at his highest level of independence.     Time  6    Period  Weeks    Status  On-going      OT LONG TERM GOAL #2   Title  Patient will increase LUE A/ROM to WNL in order to complete overhead reaching tasks without difficulty.  Time  6    Period  Weeks    Status  On-going      OT LONG TERM GOAL #3   Title  Patient will increase LUE strength to 4+/5 to allow him to return to working on his cars without shoulder fatigue and pain.     Time  6    Period  Weeks    Status  On-going      OT LONG TERM GOAL #4   Title  Patient will decrease LUE pain to approximately 2/10 or less when completing daily and leisure tasks.     Time  6    Period  Weeks    Status  On-going      OT LONG TERM GOAL #5   Title  Patient will decrease his fascial restrictions to min amount or less in order to increase his functional mobility needed to complete activities above shoulder level.     Time  6    Period  Weeks    Status  On-going            Plan - 10/06/18 1645    Clinical Impression Statement  A:  Patient is making steady progress with his P/ROM in supine.  He is using his arm functionally around the home, with increased ease with activities at waist height.  He continues to have difficulty  reaching overhead, behind his back and lifting heavy items.      Plan  P:  Continue pulleys, progress to next stage of protocol when Dr. Marlou Sa allows patient to do so.  Continue to progress P/ROM.        Patient will benefit from skilled therapeutic intervention in order to improve the following deficits and impairments:  Pain, Increased fascial restrictions, Decreased range of motion, Decreased strength, Impaired UE functional use  Visit Diagnosis: Other symptoms and signs involving the musculoskeletal system  Acute pain of left shoulder  Stiffness of left shoulder, not elsewhere classified    Problem List Patient Active Problem List   Diagnosis Date Noted  . Primary osteoarthritis, left shoulder   . 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  . History of colonic polyps 11/26/2017  . S/P total knee replacement, left 06/16/17 06/16/2017  . Primary osteoarthritis of left knee   . Hyperlipidemia 09/04/2016  . Angina pectoris ([place]) 09/04/2016  . OSA (obstructive sleep apnea) 09/04/2016  . COPD (chronic obstructive pulmonary disease) ([place]) 09/04/2016  . Rotator cuff tear 05/05/2014  . S/P shoulder surgery 09/26/2013  . Arthritis, shoulder region 09/26/2013  . Bursitis, shoulder 09/26/2013  . Synovitis of shoulder 09/26/2013  . Labral tear of shoulder, degenerative 09/26/2013  . Biceps tendon tear 09/26/2013  . Rotator cuff syndrome of left shoulder 06/21/2013  . Arthritis 06/21/2013  . Patellar tendinitis 03/29/2013  . Effusion of knee joint 03/29/2013  . Bursitis/tendonitis, shoulder 03/10/2013  . Effusion of knee joint, left 08/18/2011  . Knee pain 08/18/2011  . Acute torn meniscus 07/30/2011  . Old torn meniscus of knee 07/30/2011  . ARTHRITIS, LEFT KNEE 10/15/2010  . MEDIAL MENISCUS TEAR, RIGHT 10/15/2010  . HIP PAIN 01/15/2010  . DEGENERATIVE DISC DISEASE, LUMBOSACRAL SPINE W/RADICULOPATHY 01/15/2010  . PLICA SYNDROME 29/92/4268  . DERANGEMENT MENISCUS 07/09/2009  . JOINT  EFFUSION, LEFT KNEE 07/09/2009  . KNEE, ARTHRITIS, DEGEN./OSTEO 01/30/2009  . KNEE PAIN 01/30/2009  . ANKLE SPRAIN, RIGHT 11/08/2007    Vangie Bicker, [place], OTR/L [phone]  10/06/2018, 4:47 PM  [place]  [place], [place], [address] Phone: [phone]   Fax:  [phone]  Name: Christopher [name] MRN: [id number] Date of Birth: 02-17-1956

## 2018-10-08 ENCOUNTER — Ambulatory Visit (INDEPENDENT_AMBULATORY_CARE_PROVIDER_SITE_OTHER): Payer: PPO | Admitting: Orthopedic Surgery

## 2018-10-08 ENCOUNTER — Encounter (INDEPENDENT_AMBULATORY_CARE_PROVIDER_SITE_OTHER): Payer: Self-pay | Admitting: Orthopedic Surgery

## 2018-10-08 ENCOUNTER — Other Ambulatory Visit (INDEPENDENT_AMBULATORY_CARE_PROVIDER_SITE_OTHER): Payer: Self-pay

## 2018-10-08 DIAGNOSIS — M19019 Primary osteoarthritis, unspecified shoulder: Secondary | ICD-10-CM

## 2018-10-08 MED ORDER — METHOCARBAMOL 500 MG PO TABS: 500.0000 mg | ORAL_TABLET | Freq: Four times a day (QID) | ORAL | 0 refills | Status: DC | PRN

## 2018-10-08 MED ORDER — HYDROCODONE-ACETAMINOPHEN 10-325 MG PO TABS: 1.0000 | ORAL_TABLET | Freq: Three times a day (TID) | ORAL | 0 refills | Status: DC

## 2018-10-08 NOTE — Progress Notes (Signed)
Post-Op Visit Note   Patient: Christopher [name]           Date of Birth: 04-Apr-1956           MRN: [id number] Visit Date: 10/08/2018 PCP: Sinda Du, MD   Assessment & Plan:  Chief Complaint:  Chief Complaint  Patient presents with  . Left Shoulder - Routine Post Op   Visit Diagnoses:  1. Shoulder arthritis     Plan: Christopher [name] is 6 weeks out left shoulder reverse shoulder replacement.  He is doing well.  He is taking less pain medicine.  On exam he has forward flexion abduction both above 90 degrees.  Strength is also improving.  He is doing cone in [place].  Refill pain meds x1 only.  Continue with physical therapy for 6 more weeks.  Come back in 6 weeks for final clinical recheck.  Follow-Up Instructions: Return in about 6 weeks (around 11/19/2018).   Orders:  No orders of the defined types were placed in this encounter.  No orders of the defined types were placed in this encounter.   Imaging: No results found.  PMFS History: Patient Active Problem List   Diagnosis Date Noted  . Primary osteoarthritis, left shoulder   . 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  . History of colonic polyps 11/26/2017  . S/P total knee replacement, left 06/16/17 06/16/2017  . Primary osteoarthritis of left knee   . Hyperlipidemia 09/04/2016  . Angina pectoris ([place]) 09/04/2016  . OSA (obstructive sleep apnea) 09/04/2016  . COPD (chronic obstructive pulmonary disease) ([place]) 09/04/2016  . Rotator cuff tear 05/05/2014  . S/P shoulder surgery 09/26/2013  . Arthritis, shoulder region 09/26/2013  . Bursitis, shoulder 09/26/2013  . Synovitis of shoulder 09/26/2013  . Labral tear of shoulder, degenerative 09/26/2013  . Biceps tendon tear 09/26/2013  . Rotator cuff syndrome of left shoulder 06/21/2013  . Arthritis 06/21/2013  . Patellar tendinitis 03/29/2013  . Effusion of knee joint 03/29/2013  . Bursitis/tendonitis, shoulder 03/10/2013  . Effusion of knee joint, left 08/18/2011  . Knee pain  08/18/2011  . Acute torn meniscus 07/30/2011  . Old torn meniscus of knee 07/30/2011  . ARTHRITIS, LEFT KNEE 10/15/2010  . MEDIAL MENISCUS TEAR, RIGHT 10/15/2010  . HIP PAIN 01/15/2010  . DEGENERATIVE DISC DISEASE, LUMBOSACRAL SPINE W/RADICULOPATHY 01/15/2010  . PLICA SYNDROME 27/25/3664  . DERANGEMENT MENISCUS 07/09/2009  . JOINT EFFUSION, LEFT KNEE 07/09/2009  . KNEE, ARTHRITIS, DEGEN./OSTEO 01/30/2009  . KNEE PAIN 01/30/2009  . ANKLE SPRAIN, RIGHT 11/08/2007   Past Medical History:  Diagnosis Date  . Anxiety   . Arthritis   . Asthma   . Complication of anesthesia    pt had a hard time being able to move after spinal anesthesia , 3-4 hours  . Depression   . GERD (gastroesophageal reflux disease)   . Headache(784.0)    after surgery  . Heart murmur    Years ago- not now  . Hyperlipidemia   . IBS (irritable bowel syndrome)   . PONV (postoperative nausea and vomiting)   . Sleep apnea    uses CIPAP machine at night    Family History  Problem Relation Age of Onset  . Diabetes Other   . Lung disease Other   . Arthritis Other   . Anesthesia problems Neg Hx   . Hypotension Neg Hx   . Malignant hyperthermia Neg Hx   . Pseudochol deficiency Neg Hx     Past Surgical History:  Procedure Laterality Date  . APPENDECTOMY    .  BACK SURGERY     neck and back fusion  . BIOPSY  12/27/2015   Procedure: BIOPSY;  Surgeon: Rogene Houston, MD;  [facility];  Service: Endoscopy;;  Fundus biopsies and duodenal biopsies  . CARDIAC CATHETERIZATION    . CARDIAC CATHETERIZATION N/A 09/04/2016   Procedure: Right/Left Heart Cath and Coronary Angiography;  Surgeon: Peter M Martinique, MD;  Location: [facility];  Service: Cardiovascular;  Laterality: N/A;  . CHOLECYSTECTOMY    . CHONDROPLASTY  08/15/2011   Procedure: CHONDROPLASTY;  Surgeon: [name], MD;  Location: [facility];  Service: Orthopedics;  Laterality: Left;  . COLONOSCOPY  06/27/2011   Procedure: COLONOSCOPY;   Surgeon: Rogene Houston, MD;  [facility];  Service: Endoscopy;  Laterality: N/A;  9:00 / Pt to be here at 9am for 10:45 procedure, benign polyps removed  . COLONOSCOPY N/A 10/05/2014   Procedure: COLONOSCOPY;  Surgeon: Rogene Houston, MD;  [facility];  Service: Endoscopy;  Laterality: N/A;  930  . COLONOSCOPY N/A 02/11/2018   Procedure: COLONOSCOPY;  Surgeon: Rogene Houston, MD;  [facility];  Service: Endoscopy;  Laterality: N/A;  830  . ESOPHAGOGASTRODUODENOSCOPY N/A 12/27/2015   Procedure: ESOPHAGOGASTRODUODENOSCOPY (EGD);  Surgeon: Rogene Houston, MD;  [facility];  Service: Endoscopy;  Laterality: N/A;  3:00  . HERNIA REPAIR     umbilical hernia  . KNEE ARTHROSCOPY     left knee  . KNEE ARTHROSCOPY     right knee   . LUMBAR LAMINECTOMY/DECOMPRESSION MICRODISCECTOMY  09/14/2012   Procedure: LUMBAR LAMINECTOMY/DECOMPRESSION MICRODISCECTOMY 1 LEVEL;  Surgeon: Floyce Stakes, MD;  Location: [facility];  Service: Neurosurgery;  Laterality: Right;  Right Lumbar three-four Diskectomy  . neck fusion    . POLYPECTOMY  02/11/2018   Procedure: POLYPECTOMY;  Surgeon: Rogene Houston, MD;  [facility];  Service: Endoscopy;;  colon  . SHOULDER ARTHROSCOPY WITH BICEPSTENOTOMY Left 09/23/2013   Procedure: SHOULDER ARTHROSCOPY WITH BICEPSTENOTOMY AND EXTENSIVE DEBRIDEMENT;  Surgeon: Carole Civil, MD;  Location: [facility];  Service: Orthopedics;  Laterality: Left;  . SHOULDER ARTHROSCOPY WITH ROTATOR CUFF REPAIR Left 05/05/2014   Procedure: SHOULDER ARTHROSCOPY LIMITED DEBRIDEMENT;  Surgeon: Carole Civil, MD;  Location: [facility];  Service: Orthopedics;  Laterality: Left;  . SHOULDER OPEN ROTATOR CUFF REPAIR Left 05/05/2014   Procedure: ROTATOR CUFF REPAIR SHOULDER OPEN;  Surgeon: Carole Civil, MD;  Location: [facility];  Service: Orthopedics;  Laterality: Left;  . SHOULDER SURGERY Right    Open Mumford procedure  . SPINAL FUSION      x 2  . TOTAL KNEE ARTHROPLASTY Left 06/16/2017   Procedure: LEFT TOTAL KNEE ARTHROPLASTY;  Surgeon: Carole Civil, MD;  Location: [facility];  Service: Orthopedics;  Laterality: Left;  . TOTAL SHOULDER ARTHROPLASTY Left 08/19/2018   Procedure: left shoulder replacement;  Surgeon: Meredith Pel, MD;  Location: [facility];  Service: Orthopedics;  Laterality: Left;   Social History   Occupational History  . 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 Smoking status: Current Every Day Smoker    Packs/day: 1.00    Years: 41.00    Pack years: 41.00    Types: Cigarettes  . Smokeless tobacco: Never Used  . Tobacco comment: smokes a pack a day. since age 5  Substance and Sexual Activity  . Alcohol use: No    Alcohol/week: 0.0 standard drinks  .  Drug use: No  . Sexual activity: Not Currently

## 2018-10-11 ENCOUNTER — Ambulatory Visit (HOSPITAL_COMMUNITY): Payer: PPO

## 2018-10-11 ENCOUNTER — Telehealth (HOSPITAL_COMMUNITY): Payer: Self-pay | Admitting: Pulmonary Disease

## 2018-10-11 NOTE — Telephone Encounter (Signed)
10/11/18  wife left a message that he wouldn't be here because he had to go and meet the gas man at their other house

## 2018-10-13 ENCOUNTER — Ambulatory Visit (HOSPITAL_COMMUNITY): Payer: PPO

## 2018-10-18 ENCOUNTER — Encounter (HOSPITAL_COMMUNITY): Payer: PPO

## 2018-10-20 ENCOUNTER — Encounter (HOSPITAL_COMMUNITY): Payer: PPO

## 2018-11-03 ENCOUNTER — Encounter: Payer: Self-pay | Admitting: Orthopedic Surgery

## 2018-11-03 ENCOUNTER — Ambulatory Visit: Payer: PPO | Admitting: Orthopedic Surgery

## 2018-11-03 ENCOUNTER — Ambulatory Visit (INDEPENDENT_AMBULATORY_CARE_PROVIDER_SITE_OTHER): Payer: PPO

## 2018-11-03 VITALS — Ht 66.0 in | Wt 235.0 lb

## 2018-11-03 DIAGNOSIS — G8929 Other chronic pain: Secondary | ICD-10-CM | POA: Diagnosis not present

## 2018-11-03 DIAGNOSIS — M25511 Pain in right shoulder: Secondary | ICD-10-CM | POA: Diagnosis not present

## 2018-11-03 NOTE — Progress Notes (Signed)
Chief Complaint  Patient presents with  . Shoulder Pain    right shoulder pain     63 year old male recently status post left reverse shoulder replacement comes in complaining of right shoulder pain no trauma  Review of systems chronic pain in various joints left shoulder feels fairly well although he says it is a little sore today  Ht 5\' 6"  (1.676 m)   Wt 235 lb (106.6 kg)   BMI 37.93 kg/m  Crepitance in the right shoulder but 5 out of 5 strength in the rotator cuff There is no swelling or tenderness he has a previous scar from a distal clavicle excision.  Skin looks good otherwise neurovascular exam is intact there is no epitrochlear lymph nodes no supraclavicular lymph nodes that are palpable  X-ray was taken he has a mild spur at the inferior margin of the humeral head with a apparent glenoid defect  Impression rotator cuff syndrome without tear   Procedure note the subacromial injection shoulder RIGHT  Verbal consent was obtained to inject the  RIGHT   Shoulder  Timeout was completed to confirm the injection site is a subacromial space of the  RIGHT  shoulder   Medication used Depo-Medrol 40 mg and lidocaine 1% 3 cc  Anesthesia was provided by ethyl chloride  The injection was performed in the RIGHT  posterior subacromial space. After pinning the skin with alcohol and anesthetized the skin with ethyl chloride the subacromial space was injected using a 20-gauge needle. There were no complications  Sterile dressing was applied.   Encounter Diagnosis  Name Primary?  . Chronic right shoulder pain Yes   Follow-up as needed

## 2018-11-07 IMAGING — MR MR SHOULDER*L* W/O CM
4 of 5 series · 20 of 40 positions shown · non-contrast
Comparison: 04/23/2014

CLINICAL DATA: Anterior shoulder pain with limited range of motion
and popping. Prior shoulder surgery in 9402.

EXAM:
MRI OF THE LEFT SHOULDER WITHOUT CONTRAST
TECHNIQUE: Multiplanar, multisequence MR imaging of the shoulder was performed.
No intravenous contrast was administered.

[Series 6: PD fat-sat · axial · left · 4.0mm · 0.44mm/px · z∈[-63,+31]mm · 8 of 22 slices shown (1 of 2)]
[im 1/22]
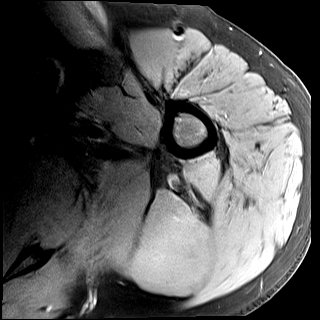
[im 4/22]
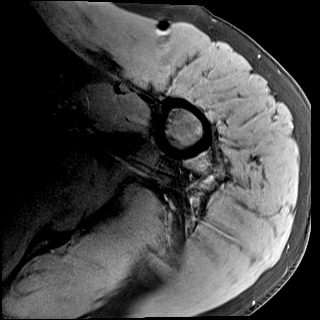
[im 7/22]
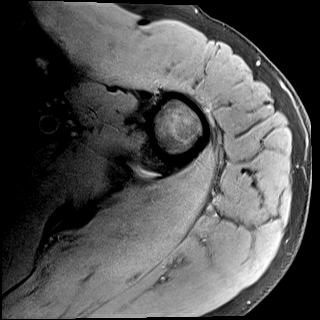
[im 10/22]
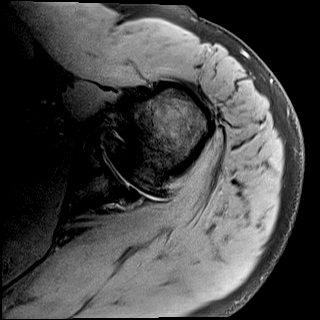
[im 13/22]
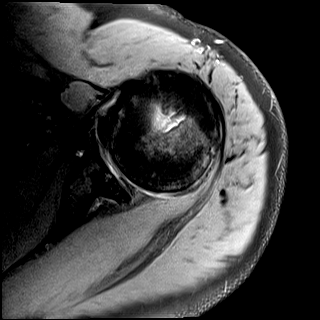
[im 16/22]
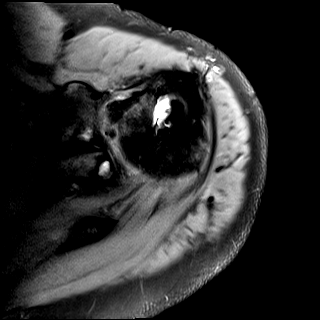
[im 19/22]
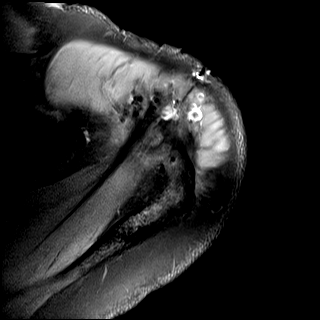
[im 22/22]
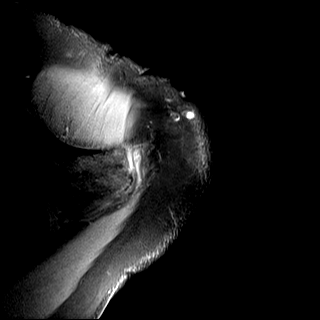

[Series 7: T2 fat-sat · oblique · left · 4.0mm · 0.22mm/px · 3 of 21 slices shown (1 of 2)]
[im 3/21]
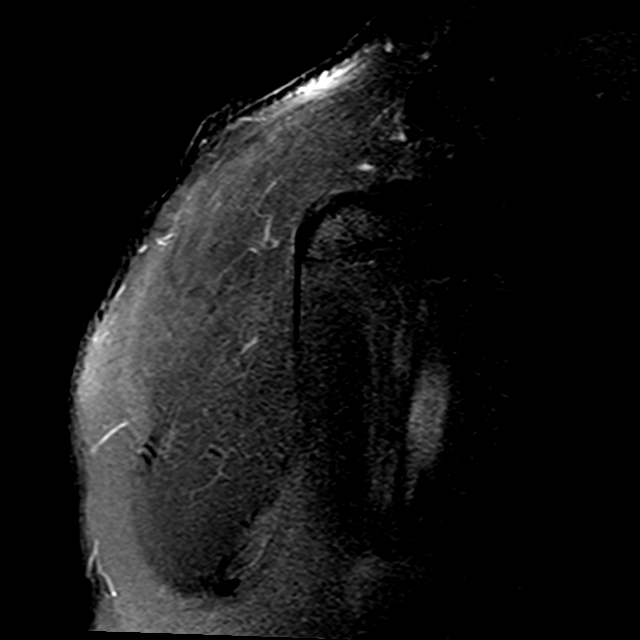
[im 12/21]
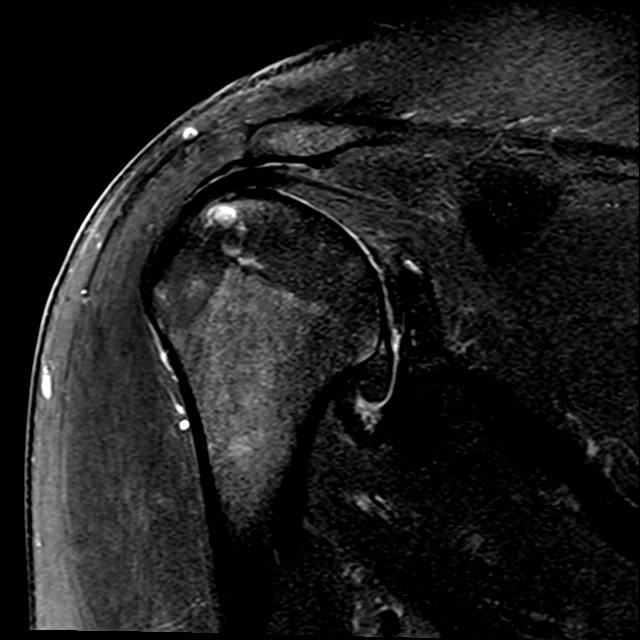
[im 18/21]
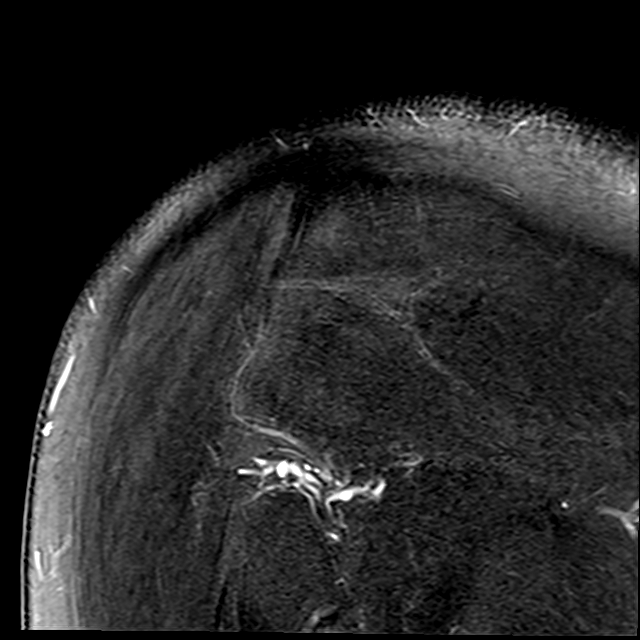

[Series 8: PD fat-sat · oblique · left · 4.0mm · 0.22mm/px · 6 of 21 slices shown (2 of 2)]
[im 1/21]
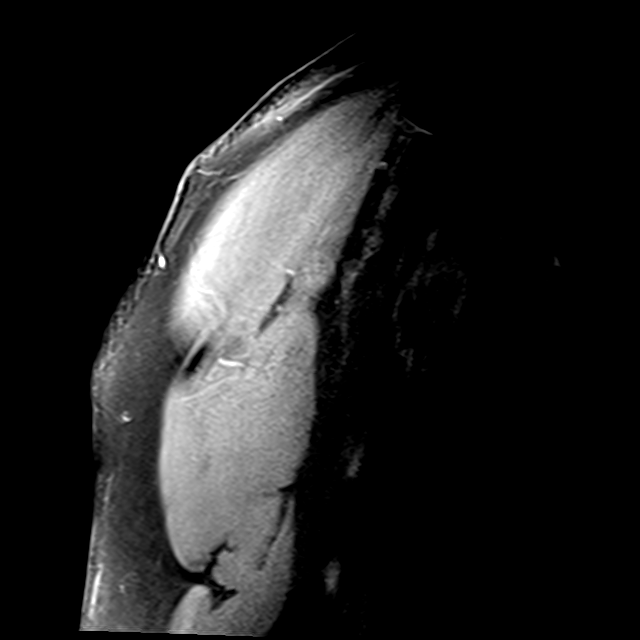
[im 3/21]
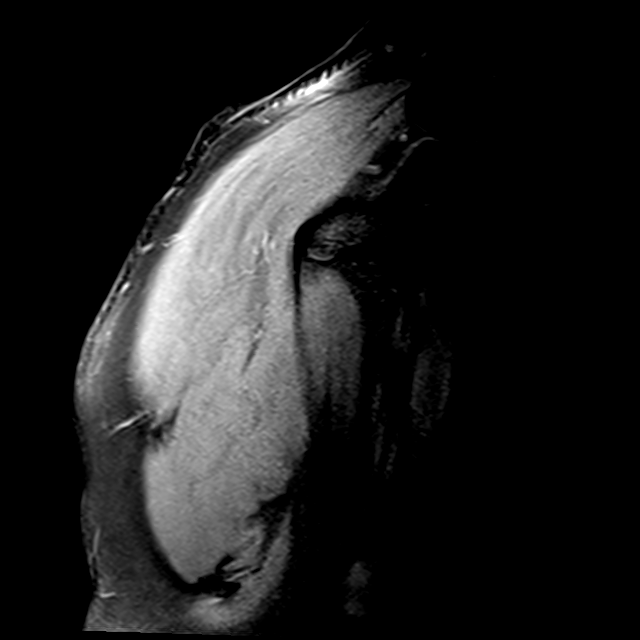
[im 6/21]
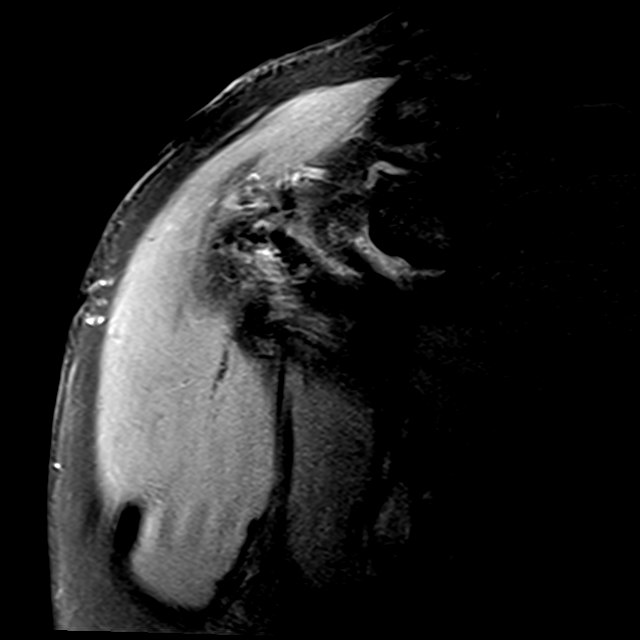
[im 9/21]
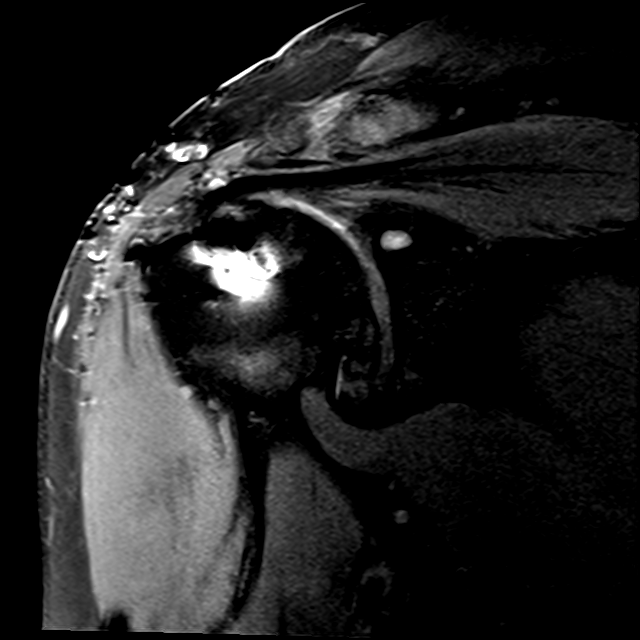
[im 12/21]
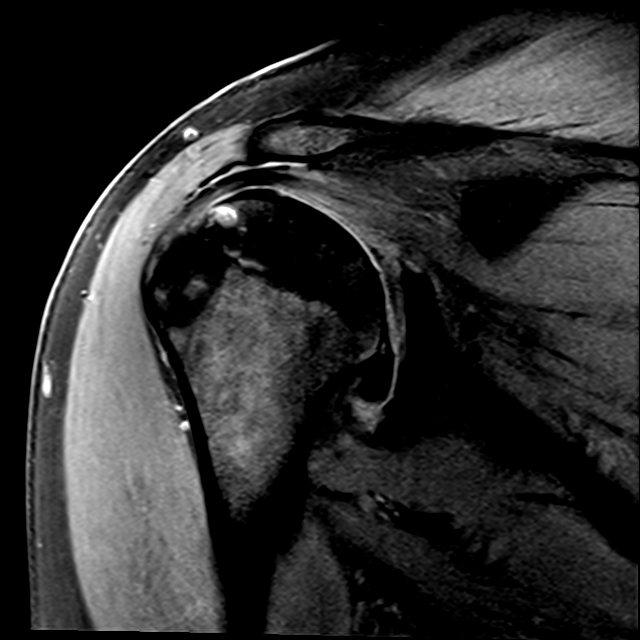
[im 18/21]
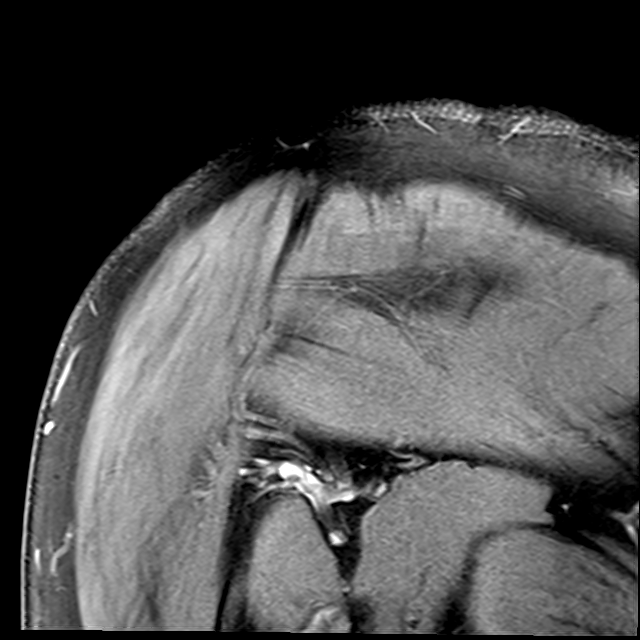

[Series 9: T2 fat-sat · oblique · left · 4.0mm · 0.44mm/px · 3 of 23 slices shown (2 of 2)]
[im 4/23]
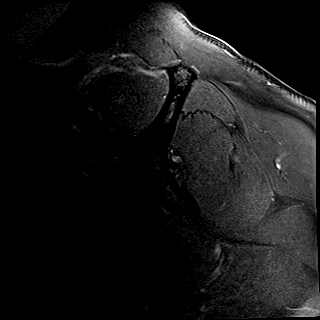
[im 13/23]
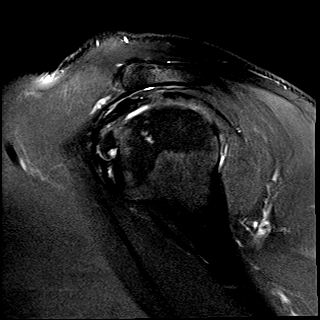
[im 19/23]
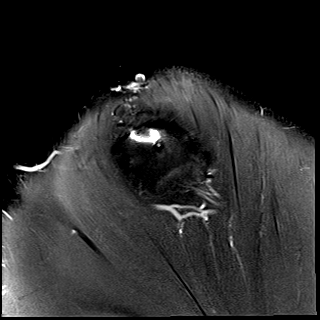

[20 of 40 positions shown; findings below may reference images not displayed]

FINDINGS: Rotator cuff: Partial-thickness distal articular surface tearing on
images 13-14 of series 9 of the subscapularis tendon for example.
The supraspinatus and infraspinatus tendons appear intact.

Muscles:  Unremarkable

Biceps long head: Absent intra-articular segment long-head of the
biceps. The tendon is visible in the bicipital groove, query biceps
tenotomy or tenodesis.

Acromioclavicular Joint: Mild spurring. No significant surrounding
soft tissue edema. Type II acromion. No significant fluid in the
subacromial subdeltoid bursa.

Glenohumeral Joint: Severe chondral thinning along the humeral head
with a prominent inferior articular spur of the humeral head shown
on image [DATE]. Moderate to prominent chondral thinning along the
glenoid, with scattered degenerative subcortical cysts along the
glenoid. Overall the findings of prominent degenerative glenohumeral
arthropathy have increased compared to 04/23/2014.

Labrum: There is some accentuated signal in the posterosuperior
labrum without a well-defined surface tear, this probably represents
labral degeneration.

Bones: No significant extra-articular osseous abnormalities
identified.

Other: No supplemental non-categorized findings.
IMPRESSION: 1. Partial-thickness distal articular surface tearing of the
subscapularis tendon, worsened from previous. No supraspinatus or
infraspinatus tear. Prior rotator cuff repair.
2. Intra-articular segment of the long head of the biceps is absent,
compatible with biceps tenotomy or tenodesis.
3. Progressive and severe degenerative chondral thinning along the
humeral head with increase in degenerative subcortical cystic
lesions along the glenoid, and prominent increase in humeral head
spurring.
4. Degeneration of the posterosuperior labrum without a well-defined
tear.

## 2018-11-15 DIAGNOSIS — F419 Anxiety disorder, unspecified: Secondary | ICD-10-CM | POA: Diagnosis not present

## 2018-11-15 DIAGNOSIS — I10 Essential (primary) hypertension: Secondary | ICD-10-CM | POA: Diagnosis not present

## 2018-11-15 DIAGNOSIS — K210 Gastro-esophageal reflux disease with esophagitis: Secondary | ICD-10-CM | POA: Diagnosis not present

## 2018-11-15 DIAGNOSIS — J449 Chronic obstructive pulmonary disease, unspecified: Secondary | ICD-10-CM | POA: Diagnosis not present

## 2018-11-19 ENCOUNTER — Ambulatory Visit (INDEPENDENT_AMBULATORY_CARE_PROVIDER_SITE_OTHER): Payer: PPO

## 2018-11-19 ENCOUNTER — Ambulatory Visit (INDEPENDENT_AMBULATORY_CARE_PROVIDER_SITE_OTHER): Payer: PPO | Admitting: Orthopedic Surgery

## 2018-11-19 ENCOUNTER — Encounter (INDEPENDENT_AMBULATORY_CARE_PROVIDER_SITE_OTHER): Payer: Self-pay | Admitting: Orthopedic Surgery

## 2018-11-19 ENCOUNTER — Other Ambulatory Visit: Payer: Self-pay

## 2018-11-19 ENCOUNTER — Ambulatory Visit (INDEPENDENT_AMBULATORY_CARE_PROVIDER_SITE_OTHER): Payer: Self-pay

## 2018-11-19 DIAGNOSIS — M25512 Pain in left shoulder: Secondary | ICD-10-CM | POA: Diagnosis not present

## 2018-11-19 MED ORDER — METHOCARBAMOL 500 MG PO TABS: 500.0000 mg | ORAL_TABLET | Freq: Three times a day (TID) | ORAL | 0 refills | Status: DC | PRN

## 2018-11-19 NOTE — Progress Notes (Signed)
Post-Op Visit Note   Patient: Christopher [name]           Date of Birth: [birth year]           MRN: [id number] Visit Date: 11/19/2018 PCP: Sinda Du, MD   Assessment & Plan:  Chief Complaint:  Chief Complaint  Patient presents with  . Left Shoulder - Routine Post Op   Visit Diagnoses:  1. Left shoulder pain, unspecified chronicity     Plan: Koree is a patient with left shoulder replacement done 08/19/2018.  He was doing very well until he decided to move a couch out of a doorway 4 weeks ago.  Definitely was counseled against doing that type of heavy lifting.  Especially that soon after surgery.  He felt a tearing sensation in the front of his shoulder.  He has been having pain since then which is not improving.  On examination the shoulder is reduced.  He does have more external rotation to about 70 degrees and he had previously.  Subscap strength is also weak.  Infraspinatus supraspinatus strength is intact.  Radiographs show new subluxation of the humeral head in relation to the glenoid.  The shoulder is reduced.  Impression is likely subscap failure which is either off of bone or at the muscle tendon junction.  Hard to assess which it will be.  Need ultrasound for better assessment.  I think at this time based on his symptoms he still has good forward flexion and abduction.  He may need revision to reverse replacement if the subscap is not repairable and if his symptoms do not abate.  I will call him with the results of the ultrasound and we can make plans from there.  Follow-Up Instructions: No follow-ups on file.   Orders:  Orders Placed This Encounter  Procedures  . XR Shoulder Left  . [place] Extrem Up Right Ltd   Meds ordered this encounter  Medications  . methocarbamol (ROBAXIN) 500 MG tablet    Sig: Take 1 tablet (500 mg total) by mouth every 8 (eight) hours as needed for muscle spasms.    Dispense:  30 tablet    Refill:  0    Imaging: Xr Shoulder Left   Result Date: 11/19/2018 [facility] axillary outlet left shoulder reviewed.  Anterior subluxation of the humeral head is present in relation to the glenoid.  This is new compared to prior radiographs.  No fracture or dislocation is present.   PMFS History: Patient Active Problem List   Diagnosis Date Noted  . Primary osteoarthritis, left shoulder   . 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  . History of colonic polyps 11/26/2017  . S/P total knee replacement, left 06/16/17 06/16/2017  . Primary osteoarthritis of left knee   . Hyperlipidemia 09/04/2016  . Angina pectoris ([place]) 09/04/2016  . OSA (obstructive sleep apnea) 09/04/2016  . COPD (chronic obstructive pulmonary disease) ([place]) 09/04/2016  . Rotator cuff tear 05/05/2014  . S/P shoulder surgery 09/26/2013  . Arthritis, shoulder region 09/26/2013  . Bursitis, shoulder 09/26/2013  . Synovitis of shoulder 09/26/2013  . Labral tear of shoulder, degenerative 09/26/2013  . Biceps tendon tear 09/26/2013  . Rotator cuff syndrome of left shoulder 06/21/2013  . Arthritis 06/21/2013  . Patellar tendinitis 03/29/2013  . Effusion of knee joint 03/29/2013  . Bursitis/tendonitis, shoulder 03/10/2013  . Effusion of knee joint, left 08/18/2011  . Knee pain 08/18/2011  . Acute torn meniscus 07/30/2011  . Old torn meniscus of knee 07/30/2011  . ARTHRITIS, LEFT KNEE  10/15/2010  . MEDIAL MENISCUS TEAR, RIGHT 10/15/2010  . HIP PAIN 01/15/2010  . DEGENERATIVE DISC DISEASE, LUMBOSACRAL SPINE W/RADICULOPATHY 01/15/2010  . PLICA SYNDROME 49/70/2637  . DERANGEMENT MENISCUS 07/09/2009  . JOINT EFFUSION, LEFT KNEE 07/09/2009  . KNEE, ARTHRITIS, DEGEN./OSTEO 01/30/2009  . KNEE PAIN 01/30/2009  . ANKLE SPRAIN, RIGHT 11/08/2007   Past Medical History:  Diagnosis Date  . Anxiety   . Arthritis   . Asthma   . Complication of anesthesia    pt had a hard time being able to move after spinal anesthesia , 3-4 hours  . Depression   . GERD (gastroesophageal reflux  disease)   . Headache(784.0)    after surgery  . Heart murmur    Years ago- not now  . Hyperlipidemia   . IBS (irritable bowel syndrome)   . PONV (postoperative nausea and vomiting)   . Sleep apnea    uses CIPAP machine at night    Family History  Problem Relation Age of Onset  . Diabetes Other   . Lung disease Other   . Arthritis Other   . Anesthesia problems Neg Hx   . Hypotension Neg Hx   . Malignant hyperthermia Neg Hx   . Pseudochol deficiency Neg Hx     Past Surgical History:  Procedure Laterality Date  . APPENDECTOMY    . BACK SURGERY     neck and back fusion  . BIOPSY  12/27/2015   Procedure: BIOPSY;  Surgeon: Rogene Houston, MD;  [facility];  Service: Endoscopy;;  Fundus biopsies and duodenal biopsies  . CARDIAC CATHETERIZATION    . CARDIAC CATHETERIZATION N/A 09/04/2016   Procedure: Right/Left Heart Cath and Coronary Angiography;  Surgeon: Peter M Martinique, MD;  Location: [facility];  Service: Cardiovascular;  Laterality: N/A;  . CHOLECYSTECTOMY    . CHONDROPLASTY  08/15/2011   Procedure: CHONDROPLASTY;  Surgeon: [name], MD;  Location: [facility];  Service: Orthopedics;  Laterality: Left;  . COLONOSCOPY  06/27/2011   Procedure: COLONOSCOPY;  Surgeon: Rogene Houston, MD;  [facility];  Service: Endoscopy;  Laterality: N/A;  9:00 / Pt to be here at 9am for 10:45 procedure, benign polyps removed  . COLONOSCOPY N/A 10/05/2014   Procedure: COLONOSCOPY;  Surgeon: Rogene Houston, MD;  [facility];  Service: Endoscopy;  Laterality: N/A;  930  . COLONOSCOPY N/A 02/11/2018   Procedure: COLONOSCOPY;  Surgeon: Rogene Houston, MD;  [facility];  Service: Endoscopy;  Laterality: N/A;  830  . ESOPHAGOGASTRODUODENOSCOPY N/A 12/27/2015   Procedure: ESOPHAGOGASTRODUODENOSCOPY (EGD);  Surgeon: Rogene Houston, MD;  [facility];  Service: Endoscopy;  Laterality: N/A;  3:00  . HERNIA REPAIR     umbilical hernia  .  JOINT REPLACEMENT     left reverse shoulder   . KNEE ARTHROSCOPY     left knee  . KNEE ARTHROSCOPY     right knee   . LUMBAR LAMINECTOMY/DECOMPRESSION MICRODISCECTOMY  09/14/2012   Procedure: LUMBAR LAMINECTOMY/DECOMPRESSION MICRODISCECTOMY 1 LEVEL;  Surgeon: Floyce Stakes, MD;  Location: [facility];  Service: Neurosurgery;  Laterality: Right;  Right Lumbar three-four Diskectomy  . neck fusion    . POLYPECTOMY  02/11/2018   Procedure: POLYPECTOMY;  Surgeon: Rogene Houston, MD;  [facility];  Service: Endoscopy;;  colon  . SHOULDER ARTHROSCOPY WITH BICEPSTENOTOMY Left 09/23/2013   Procedure: SHOULDER ARTHROSCOPY WITH BICEPSTENOTOMY AND EXTENSIVE DEBRIDEMENT;  Surgeon: Carole Civil,  MD;  Location: [facility];  Service: Orthopedics;  Laterality: Left;  . SHOULDER ARTHROSCOPY WITH ROTATOR CUFF REPAIR Left 05/05/2014   Procedure: SHOULDER ARTHROSCOPY LIMITED DEBRIDEMENT;  Surgeon: Carole Civil, MD;  Location[facility];  Service: Orthopedics;  Laterality: Left;  . SHOULDER OPEN ROTATOR CUFF REPAIR Left 05/05/2014   Procedure: ROTATOR CUFF REPAIR SHOULDER OPEN;  Surgeon: Carole Civil, MD;  Location: [facility];  Service: Orthopedics;  Laterality: Left;  . SHOULDER SURGERY Right    Open Mumford procedure  . SPINAL FUSION     x 2  . TOTAL KNEE ARTHROPLASTY Left 06/16/2017   Procedure: LEFT TOTAL KNEE ARTHROPLASTY;  Surgeon: Carole Civil, MD;  Location: [facility];  Service: Orthopedics;  Laterality: Left;  . TOTAL SHOULDER ARTHROPLASTY Left 08/19/2018   Procedure: left shoulder replacement;  Surgeon: Meredith Pel, MD;  Location: [facility];  Service: Orthopedics;  Laterality: Left;   Social History   Occupational History  . 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 Smoking status: Current Every Day Smoker    Packs/day: 1.00    Years: 41.00    Pack years: 41.00    Types: Cigarettes  . Smokeless tobacco: Never Used  . Tobacco comment: smokes  a pack a day. since age 74  Substance and Sexual Activity  . Alcohol use: No    Alcohol/week: 0.0 standard drinks  . Drug use: No  . Sexual activity: Not Currently

## 2018-11-23 ENCOUNTER — Telehealth (INDEPENDENT_AMBULATORY_CARE_PROVIDER_SITE_OTHER): Payer: Self-pay | Admitting: Orthopedic Surgery

## 2018-11-23 MED ORDER — HYDROCODONE-ACETAMINOPHEN 10-325 MG PO TABS: 1.0000 | ORAL_TABLET | Freq: Three times a day (TID) | ORAL | 0 refills | Status: DC

## 2018-11-23 NOTE — Telephone Encounter (Signed)
I called [facility] and sw Miriam and she is stating they are not doing any exams until further notice, and we both got in the chart to see why it shows he had exam done and she believes that when they called to cancel the appt with pt it some how showed that it was completed when it wasn't performed. Prentiss Bells says that she will print out the order and once the "coronavirus' has subsided they will contact pt to reschedule.

## 2018-11-23 NOTE — Telephone Encounter (Signed)
Patient needing refill on Norco 10/325. Ok to refill?  Gabriel Cirri can you check on status of ultrasound?

## 2018-11-23 NOTE — Telephone Encounter (Signed)
Ok to rf pls call thx

## 2018-11-23 NOTE — Addendum Note (Signed)
Addended byLaurann Montana on: 11/23/2018 03:47 PM   Modules accepted: Orders

## 2018-11-23 NOTE — Telephone Encounter (Signed)
New Message  Pt wife verbalized her husband the patient was supposed to have a CAT scan but they have not heard back from anyone.  Pts wife also verbalized she would like to go over some medications with the nurse.  Please f/u

## 2018-11-23 NOTE — Telephone Encounter (Signed)
FYI Ultrasound ordered not to be performed. Please still advise about medication refill.

## 2018-11-23 NOTE — Telephone Encounter (Signed)
IC LM advising could pick up at front desk tomorrow Advised also about scan.

## 2018-11-24 ENCOUNTER — Other Ambulatory Visit (INDEPENDENT_AMBULATORY_CARE_PROVIDER_SITE_OTHER): Payer: Self-pay | Admitting: Orthopedic Surgery

## 2018-11-24 DIAGNOSIS — M25512 Pain in left shoulder: Secondary | ICD-10-CM

## 2018-11-24 NOTE — Addendum Note (Signed)
Addended by: Daylene Posey T on: 11/24/2018 09:37 AM   Modules accepted: Orders

## 2018-12-06 ENCOUNTER — Encounter (HOSPITAL_COMMUNITY): Payer: Self-pay

## 2018-12-06 ENCOUNTER — Telehealth (INDEPENDENT_AMBULATORY_CARE_PROVIDER_SITE_OTHER): Payer: Self-pay

## 2018-12-06 NOTE — Telephone Encounter (Signed)
appt scheduled No to all prescreen questions

## 2018-12-06 NOTE — Telephone Encounter (Signed)
Please advise. Thanks.  

## 2018-12-06 NOTE — Therapy (Signed)
[facility] [place], [place], [address] Phone: [phone]   Fax:  [phone]  Patient Details  Name: Christopher [name] MRN: [id number] Date of Birth: 1956/06/13 Referring Provider:  No ref. provider found  Encounter Date: 12/06/2018 OCCUPATIONAL THERAPY DISCHARGE SUMMARY  Visits from Start of Care: 7  Current functional level related to goals / functional outcomes: From progress note on 10/06/18:  OT LONG TERM GOAL #1    Title  Patient will return to highest level of independence while using his LUE for all daily and leisure tasks at his highest level of independence.     Time  6    Period  Weeks    Status  On-going        OT LONG TERM GOAL #2   Title  Patient will increase LUE A/ROM to WNL in order to complete overhead reaching tasks without difficulty.     Time  6    Period  Weeks    Status  On-going        OT LONG TERM GOAL #3   Title  Patient will increase LUE strength to 4+/5 to allow him to return to working on his cars without shoulder fatigue and pain.     Time  6    Period  Weeks    Status  On-going        OT LONG TERM GOAL #4   Title  Patient will decrease LUE pain to approximately 2/10 or less when completing daily and leisure tasks.     Time  6    Period  Weeks    Status  On-going        OT LONG TERM GOAL #5   Title  Patient will decrease his fascial restrictions to min amount or less in order to increase his functional mobility needed to complete activities above shoulder level.     Time  6    Period  Weeks    Status  On-going         Remaining deficits: Deficits are unknown at this time. Patient has not returned to clinic since progress note was completed on 10/06/18. Due to timeframe of absence, patient will be discharged from therapy at this time.    Education / Equipment: Shoulder ROM (P/ROM).  Plan: Patient agrees to discharge.  Patient goals were not met. Patient  is being discharged due to not returning since the last visit.  ?????         Ailene Ravel, OTR/L,CBIS  [phone]  12/06/2018, 10:52 AM  [place], [place], [address] Phone: [phone]   Fax:  [phone]

## 2018-12-06 NOTE — Telephone Encounter (Signed)
Wife called. Pt in a lot of pain. Unable to go for ultrasound that was ordered because they aren't scheduling right now. Having knife like pain. Pain med not helping. Please call to discuss.

## 2018-12-06 NOTE — Telephone Encounter (Signed)
Pls have him come in tomorrow to have hilts do it thx

## 2018-12-07 ENCOUNTER — Other Ambulatory Visit: Payer: Self-pay

## 2018-12-07 ENCOUNTER — Ambulatory Visit (INDEPENDENT_AMBULATORY_CARE_PROVIDER_SITE_OTHER): Payer: PPO | Admitting: Family Medicine

## 2018-12-07 ENCOUNTER — Encounter (INDEPENDENT_AMBULATORY_CARE_PROVIDER_SITE_OTHER): Payer: Self-pay | Admitting: Family Medicine

## 2018-12-07 VITALS — BP 126/71 | HR 59 | Ht 66.0 in | Wt 235.0 lb

## 2018-12-07 DIAGNOSIS — G4733 Obstructive sleep apnea (adult) (pediatric): Secondary | ICD-10-CM | POA: Diagnosis not present

## 2018-12-07 DIAGNOSIS — E785 Hyperlipidemia, unspecified: Secondary | ICD-10-CM | POA: Diagnosis not present

## 2018-12-07 DIAGNOSIS — I10 Essential (primary) hypertension: Secondary | ICD-10-CM | POA: Diagnosis not present

## 2018-12-07 DIAGNOSIS — M25512 Pain in left shoulder: Secondary | ICD-10-CM

## 2018-12-07 DIAGNOSIS — G500 Trigeminal neuralgia: Secondary | ICD-10-CM | POA: Diagnosis not present

## 2018-12-07 NOTE — Progress Notes (Signed)
Subjective: He is here for limited diagnostic ultrasound of the left shoulder.  Ongoing pain in the anterior aspect.  He is status post arthroplasty, but injured his shoulder lifting a couch a few weeks ago.  Objective: Limited active range of motion.  Substantial pain with internal rotation against resistance.  Speeds test is equivocal.  Ultrasound left shoulder: 12 MHz linear probe was used.  Exam is limited to the anterior shoulder in the region of his pain.  Long head biceps tendon was not in the bicipital groove, likely due to previous tenodesis.  Subscapularis tendon appears to be completely torn and retracted about 1 cm.  Impression: Acute full-thickness tear left subscapularis tendon with about 1 cm retraction.  Plan: I will notify Dr. Marlou Sa who will contact him regarding treatment options.

## 2018-12-08 ENCOUNTER — Telehealth (INDEPENDENT_AMBULATORY_CARE_PROVIDER_SITE_OTHER): Payer: Self-pay

## 2018-12-08 NOTE — Telephone Encounter (Signed)
I talked with Christopher [name] about the ultrasound.  Looks like the subscap is torn and retracted about a centimeter or centimeter and a half.  Plan at this time would be surgery to either repair that subscap if it is repairable.  If not then we would have to convert the shoulder replacement to a reverse shoulder replacement.  I think it is a 50-50 proposition either way.  Please call him to see what he wants to do.

## 2018-12-08 NOTE — Telephone Encounter (Signed)
IC s/w patient and advised. He stated that he would like to do whatever you think is the best. They ask if you could please call them to discuss.

## 2018-12-08 NOTE — Telephone Encounter (Signed)
Please advise. Thanks.  

## 2018-12-08 NOTE — Telephone Encounter (Signed)
Pt called. Had Ultrasound on shoulder yesterday and wants to know if Dr. Marlou Sa has reviewed and what the next step is?

## 2018-12-10 NOTE — Telephone Encounter (Signed)
I called and discussed - he wants to proceed - rsks and bebefits d/w patient - all questions answered - pls send back to me so I can post for tuesday

## 2018-12-13 NOTE — Telephone Encounter (Signed)
Thx he is scheduled

## 2018-12-13 NOTE — Telephone Encounter (Signed)
Sending back to you.

## 2018-12-14 ENCOUNTER — Other Ambulatory Visit (INDEPENDENT_AMBULATORY_CARE_PROVIDER_SITE_OTHER): Payer: Self-pay | Admitting: Orthopedic Surgery

## 2018-12-14 DIAGNOSIS — S46812A Strain of other muscles, fascia and tendons at shoulder and upper arm level, left arm, initial encounter: Secondary | ICD-10-CM

## 2018-12-14 NOTE — Pre-Procedure Instructions (Signed)
Christopher [name]  12/14/2018      [facility] - [place], [place] - [place] [address] Phone: [phone] Fax: [phone]    Your procedure is scheduled on Thursday, April 16.  Report to [facility], Main Entrance or Entrance "A"at 10:00 AM                   Your surgery or procedure is scheduled for 8:00 A.M.   Call this number if you have problems the morning of surgery: [phone]  This is the number for the Pre- Surgical Desk.     Remember:  Do not eat or drink after midnight.   Take these medicines the morning of surgery with A SIP OF WATER : pantoprazole (PROTONIX)  sertraline (ZOLOFT)   Take if needed: methocarbamol (ROBAXIN) HYDROcodone-acetaminophen (NORCO)  PROAIR HFA Inhaler - bring this inhaler with you.  Special instructions:  [facility]- Preparing For Surgery  Before surgery, you can play an important role. Because skin is not sterile, your skin needs to be as free of germs as possible. You can reduce the number of germs on your skin by washing with CHG (chlorahexidine gluconate) Soap before surgery.  CHG is an antiseptic cleaner which kills germs and bonds with the skin to continue killing germs even after washing.    Oral Hygiene is also important to reduce your risk of infection.  Remember - BRUSH YOUR TEETH THE MORNING OF SURGERY WITH YOUR REGULAR TOOTHPASTE  Please do not use if you have an allergy to CHG or antibacterial soaps. If your skin becomes reddened/irritated stop using the CHG.  Do not shave (including legs and underarms) for at least 48 hours prior to first CHG shower. It is OK to shave your face.  Please follow these instructions carefully.   1. Shower the NIGHT BEFORE SURGERY and the MORNING OF SURGERY with CHG.   2. If you chose to wash your hair, wash your hair first as usual with your normal shampoo.  3. After you shampoo, wash your face and private area with the soap you use  at home, then rinse your hair and body thoroughly to remove the shampoo and soap.  4. Use CHG as you would any other liquid soap. You can apply CHG directly to the skin and wash gently with a scrungie or a clean washcloth.   5. Apply the CHG Soap to your body ONLY FROM THE NECK DOWN.  Do not use on open wounds or open sores. Avoid contact with your eyes, ears, mouth and genitals (private parts).  6. Wash thoroughly, paying special attention to the area where your surgery will be performed.  7. Thoroughly rinse your body with warm water from the neck down.  8. DO NOT shower/wash with your normal soap after using and rinsing off the CHG Soap.  9. Pat yourself dry with a CLEAN TOWEL.  10. Wear CLEAN PAJAMAS to bed the night before surgery, wear comfortable clothes the morning of surgery  11. Place CLEAN SHEETS on your bed the night of your first shower and DO NOT SLEEP WITH PETS.   Day of Surgery: Shower as instructed above.  Do not apply any deodorants/lotions, powders or colognes.  Please wear clean clothes to the hospital/surgery center.   Remember to brush your teeth WITH YOUR REGULAR TOOTHPASTE.             Do not wear jewelry,  make-up or nail polish.  Do not shave 48 hours prior to surgery.  Men may shave face and neck.  Do not bring valuables to the hospital.  [facility] is not responsible for any belongings or valuables.  Contacts, dentures or bridgework may not be worn into surgery.  Leave your suitcase in the car.  After surgery it may be brought to your room.  For patients admitted to the hospital, discharge time will be determined by your treatment team.  Patients discharged the day of surgery will not be allowed to drive home.   Please read over the following fact sheets that you were given.

## 2018-12-15 ENCOUNTER — Encounter (HOSPITAL_COMMUNITY)
Admission: RE | Admit: 2018-12-15 | Discharge: 2018-12-15 | Disposition: A | Discharge status: Home / Self Care | Payer: PPO | Source: Ambulatory Visit | Attending: Orthopedic Surgery | Admitting: Orthopedic Surgery

## 2018-12-15 ENCOUNTER — Encounter (HOSPITAL_COMMUNITY): Payer: Self-pay

## 2018-12-15 ENCOUNTER — Other Ambulatory Visit: Payer: Self-pay

## 2018-12-15 DIAGNOSIS — Z981 Arthrodesis status: Secondary | ICD-10-CM | POA: Diagnosis not present

## 2018-12-15 DIAGNOSIS — E785 Hyperlipidemia, unspecified: Secondary | ICD-10-CM | POA: Diagnosis present

## 2018-12-15 DIAGNOSIS — S46812A Strain of other muscles, fascia and tendons at shoulder and upper arm level, left arm, initial encounter: Secondary | ICD-10-CM | POA: Diagnosis present

## 2018-12-15 DIAGNOSIS — F419 Anxiety disorder, unspecified: Secondary | ICD-10-CM | POA: Diagnosis present

## 2018-12-15 DIAGNOSIS — M1990 Unspecified osteoarthritis, unspecified site: Secondary | ICD-10-CM | POA: Diagnosis present

## 2018-12-15 DIAGNOSIS — Z96619 Presence of unspecified artificial shoulder joint: Secondary | ICD-10-CM | POA: Diagnosis not present

## 2018-12-15 DIAGNOSIS — Z01812 Encounter for preprocedural laboratory examination: Secondary | ICD-10-CM | POA: Insufficient documentation

## 2018-12-15 DIAGNOSIS — F17210 Nicotine dependence, cigarettes, uncomplicated: Secondary | ICD-10-CM | POA: Diagnosis present

## 2018-12-15 DIAGNOSIS — Z881 Allergy status to other antibiotic agents status: Secondary | ICD-10-CM | POA: Diagnosis not present

## 2018-12-15 DIAGNOSIS — Z96652 Presence of left artificial knee joint: Secondary | ICD-10-CM | POA: Diagnosis present

## 2018-12-15 DIAGNOSIS — F329 Major depressive disorder, single episode, unspecified: Secondary | ICD-10-CM | POA: Diagnosis present

## 2018-12-15 DIAGNOSIS — Z885 Allergy status to narcotic agent status: Secondary | ICD-10-CM | POA: Diagnosis not present

## 2018-12-15 DIAGNOSIS — G8918 Other acute postprocedural pain: Secondary | ICD-10-CM | POA: Diagnosis not present

## 2018-12-15 DIAGNOSIS — M75102 Unspecified rotator cuff tear or rupture of left shoulder, not specified as traumatic: Secondary | ICD-10-CM | POA: Diagnosis not present

## 2018-12-15 DIAGNOSIS — M75112 Incomplete rotator cuff tear or rupture of left shoulder, not specified as traumatic: Secondary | ICD-10-CM | POA: Diagnosis present

## 2018-12-15 DIAGNOSIS — T84028A Dislocation of other internal joint prosthesis, initial encounter: Secondary | ICD-10-CM | POA: Diagnosis present

## 2018-12-15 DIAGNOSIS — K219 Gastro-esophageal reflux disease without esophagitis: Secondary | ICD-10-CM | POA: Diagnosis present

## 2018-12-15 DIAGNOSIS — Z96612 Presence of left artificial shoulder joint: Secondary | ICD-10-CM | POA: Diagnosis not present

## 2018-12-15 DIAGNOSIS — T84028D Dislocation of other internal joint prosthesis, subsequent encounter: Secondary | ICD-10-CM | POA: Diagnosis not present

## 2018-12-15 DIAGNOSIS — M25512 Pain in left shoulder: Secondary | ICD-10-CM | POA: Diagnosis present

## 2018-12-15 DIAGNOSIS — G4730 Sleep apnea, unspecified: Secondary | ICD-10-CM | POA: Diagnosis present

## 2018-12-15 DIAGNOSIS — Z888 Allergy status to other drugs, medicaments and biological substances status: Secondary | ICD-10-CM | POA: Diagnosis not present

## 2018-12-15 DIAGNOSIS — G4733 Obstructive sleep apnea (adult) (pediatric): Secondary | ICD-10-CM | POA: Diagnosis not present

## 2018-12-15 LAB — BASIC METABOLIC PANEL
Anion gap: 11 (ref 5–15)
BUN: 13 mg/dL (ref 6–20)
CO2: 25 mmol/L (ref 22–32)
Calcium: 9.4 mg/dL (ref 8.9–10.3)
Chloride: 103 mmol/L (ref 98–111)
Creatinine, Ser: 0.99 mg/dL (ref 0.61–1.24)
GFR calc Af Amer: >60 mL/min (ref >60)
GFR calc non Af Amer: >60 mL/min (ref >60)
Glucose, Bld: 121 mg/dL — ABNORMAL HIGH (ref 70–99)
Potassium: 4.2 mmol/L (ref 3.5–5.1)
Sodium: 139 mmol/L (ref 135–145)

## 2018-12-15 LAB — CBC
HCT: 41.7 % (ref 39.0–52.0)
Hemoglobin: 13.8 g/dL (ref 13.0–17.0)
MCH: 28.1 pg (ref 26.0–34.0)
MCHC: 33.1 g/dL (ref 30.0–36.0)
MCV: 84.9 fL (ref 80.0–100.0)
Platelets: 221 10*3/uL (ref 150–400)
RBC: 4.91 MIL/uL (ref 4.22–5.81)
RDW: 14.1 % (ref 11.5–15.5)
WBC: 9.5 10*3/uL (ref 4.0–10.5)
nRBC: 0 % (ref 0.0–0.2)

## 2018-12-15 LAB — TYPE AND SCREEN
ABO/RH(D): B POS
Antibody Screen: NEGATIVE

## 2018-12-15 LAB — SURGICAL PCR SCREEN
MRSA, PCR: NEGATIVE
Staphylococcus aureus: NEGATIVE

## 2018-12-15 NOTE — Anesthesia Preprocedure Evaluation (Addendum)
Anesthesia Evaluation  Patient identified by MRN, date of birth, ID band Patient awake    Reviewed: Allergy & Precautions, NPO status , Patient's Chart, lab work & pertinent test results  History of Anesthesia Complications (+) PONV and history of anesthetic complications  Airway Mallampati: III  TM Distance: >3 FB Neck ROM: Full    Dental no notable dental hx. (+) Edentulous Upper,    Pulmonary asthma , sleep apnea and Continuous Positive Airway Pressure Ventilation , COPD,  COPD inhaler, Current Smoker,    Pulmonary exam normal breath sounds clear to auscultation       Cardiovascular negative cardio ROS Normal cardiovascular exam Rhythm:Regular Rate:Normal  LHC 2018 The left ventricular systolic function is normal. LV end diastolic pressure is normal. The left ventricular ejection fraction is 55-65% by visual estimate. LV end diastolic pressure is normal.   1. Diffuse mild nonobstructive disease 2. Normal LV function 3. Normal right heart and LV filling pressures 4. Normal cardiac output   Neuro/Psych PSYCHIATRIC DISORDERS Anxiety Depression negative neurological ROS     GI/Hepatic Neg liver ROS, GERD  Medicated,  Endo/Other  negative endocrine ROS  Renal/GU negative Renal ROS  negative genitourinary   Musculoskeletal negative musculoskeletal ROS (+)   Abdominal   Peds negative pediatric ROS (+)  Hematology negative hematology ROS (+)   Anesthesia Other Findings   Reproductive/Obstetrics negative OB ROS                           Anesthesia Physical Anesthesia Plan  ASA: III  Anesthesia Plan: General and Regional   Post-op Pain Management:  Regional for Post-op pain   Induction: Intravenous  PONV Risk Score and Plan: 2 and Ondansetron, Dexamethasone, Midazolam, Scopolamine patch - Pre-op and Diphenhydramine  Airway Management Planned: Oral ETT  Additional Equipment:    Intra-op Plan:   Post-operative Plan: Extubation in OR  Informed Consent: I have reviewed the patients History and Physical, chart, labs and discussed the procedure including the risks, benefits and alternatives for the proposed anesthesia with the patient or authorized representative who has indicated his/her understanding and acceptance.     Dental advisory given  Plan Discussed with: CRNA  Anesthesia Plan Comments: (Pt seen by cardiology 2018 for eval of intermittent CP and coronary calcifications seen on CT. Had cath 09/04/2016 showing diffuse mild nonobstructive disease, normal LV function EF 55-65%, normal right heart and LV filling pressures, normal cardiac output. Medical therapy recommended.)       Anesthesia Quick Evaluation

## 2018-12-15 NOTE — Progress Notes (Addendum)
PCP - Luan Pulling  Cardiologist - Denies  Chest x-ray -NA   EKG - 08-17-18(Epic)  Stress Test - 2003  ECHO -   Cardiac Cath - 2018  AICD-Denies PM-Denies LOOP-Denies  Sleep Study - yes CPAP - Yes. Instructed to bring CPAP on DOS  LABS-CBC,BMP,PCR  ASA-Stopped (1 wk back per patient.Unable to recall exact date.)  HA1C-NA Fasting Blood Sugar -  Checks Blood Sugar _____ times a day  Anesthesia-Y. H/o heart murmur, no longer present per pt.  Pt denies having chest pain, sob, or fever at this time. All instructions explained to the pt, with a verbal understanding of the material. Pt agrees to go over the instructions while at home for a better understanding. The opportunity to ask questions was provided.

## 2018-12-15 NOTE — Progress Notes (Addendum)

## 2018-12-16 ENCOUNTER — Other Ambulatory Visit: Payer: Self-pay

## 2018-12-16 ENCOUNTER — Encounter (HOSPITAL_COMMUNITY): Payer: Self-pay

## 2018-12-16 ENCOUNTER — Inpatient Hospital Stay (HOSPITAL_COMMUNITY): Payer: PPO | Admitting: Anesthesiology

## 2018-12-16 ENCOUNTER — Inpatient Hospital Stay (HOSPITAL_COMMUNITY)
Admission: RE | Admit: 2018-12-16 | Discharge: 2018-12-17 | DRG: 483 | Disposition: A | Discharge status: Home / Self Care | Payer: PPO | Attending: Orthopedic Surgery | Admitting: Orthopedic Surgery

## 2018-12-16 ENCOUNTER — Inpatient Hospital Stay (HOSPITAL_COMMUNITY): Payer: PPO | Admitting: Physician Assistant

## 2018-12-16 ENCOUNTER — Encounter (HOSPITAL_COMMUNITY)
Admission: RE | Disposition: A | Discharge status: Home / Self Care | Payer: Self-pay | Source: Home / Self Care | Attending: Orthopedic Surgery

## 2018-12-16 ENCOUNTER — Inpatient Hospital Stay (HOSPITAL_COMMUNITY): Payer: PPO

## 2018-12-16 DIAGNOSIS — Z981 Arthrodesis status: Secondary | ICD-10-CM

## 2018-12-16 DIAGNOSIS — Z96619 Presence of unspecified artificial shoulder joint: Secondary | ICD-10-CM

## 2018-12-16 DIAGNOSIS — M25512 Pain in left shoulder: Secondary | ICD-10-CM | POA: Diagnosis present

## 2018-12-16 DIAGNOSIS — Z885 Allergy status to narcotic agent status: Secondary | ICD-10-CM | POA: Diagnosis not present

## 2018-12-16 DIAGNOSIS — M19019 Primary osteoarthritis, unspecified shoulder: Secondary | ICD-10-CM | POA: Diagnosis present

## 2018-12-16 DIAGNOSIS — Z96652 Presence of left artificial knee joint: Secondary | ICD-10-CM | POA: Diagnosis present

## 2018-12-16 DIAGNOSIS — K219 Gastro-esophageal reflux disease without esophagitis: Secondary | ICD-10-CM | POA: Diagnosis present

## 2018-12-16 DIAGNOSIS — T84028A Dislocation of other internal joint prosthesis, initial encounter: Secondary | ICD-10-CM

## 2018-12-16 DIAGNOSIS — Z888 Allergy status to other drugs, medicaments and biological substances status: Secondary | ICD-10-CM

## 2018-12-16 DIAGNOSIS — M1990 Unspecified osteoarthritis, unspecified site: Secondary | ICD-10-CM | POA: Diagnosis present

## 2018-12-16 DIAGNOSIS — F329 Major depressive disorder, single episode, unspecified: Secondary | ICD-10-CM | POA: Diagnosis present

## 2018-12-16 DIAGNOSIS — Z881 Allergy status to other antibiotic agents status: Secondary | ICD-10-CM

## 2018-12-16 DIAGNOSIS — F419 Anxiety disorder, unspecified: Secondary | ICD-10-CM | POA: Diagnosis present

## 2018-12-16 DIAGNOSIS — E785 Hyperlipidemia, unspecified: Secondary | ICD-10-CM | POA: Diagnosis present

## 2018-12-16 DIAGNOSIS — S46812A Strain of other muscles, fascia and tendons at shoulder and upper arm level, left arm, initial encounter: Secondary | ICD-10-CM | POA: Diagnosis present

## 2018-12-16 DIAGNOSIS — G4730 Sleep apnea, unspecified: Secondary | ICD-10-CM | POA: Diagnosis present

## 2018-12-16 DIAGNOSIS — F17210 Nicotine dependence, cigarettes, uncomplicated: Secondary | ICD-10-CM | POA: Diagnosis present

## 2018-12-16 DIAGNOSIS — M75112 Incomplete rotator cuff tear or rupture of left shoulder, not specified as traumatic: Secondary | ICD-10-CM | POA: Diagnosis present

## 2018-12-16 DIAGNOSIS — T84028D Dislocation of other internal joint prosthesis, subsequent encounter: Secondary | ICD-10-CM | POA: Diagnosis not present

## 2018-12-16 HISTORY — PX: REVISION TOTAL SHOULDER TO REVERSE TOTAL SHOULDER: SHX6313

## 2018-12-16 HISTORY — PX: SHOULDER OPEN ROTATOR CUFF REPAIR: SHX2407

## 2018-12-16 LAB — URINALYSIS, ROUTINE W REFLEX MICROSCOPIC
Bilirubin Urine: NEGATIVE
Glucose, UA: NEGATIVE mg/dL
Hgb urine dipstick: NEGATIVE
Ketones, ur: NEGATIVE mg/dL
Leukocytes,Ua: NEGATIVE
Nitrite: NEGATIVE
Protein, ur: NEGATIVE mg/dL
Specific Gravity, Urine: 1.015 (ref 1.005–1.030)
pH: 6 (ref 5.0–8.0)

## 2018-12-16 SURGERY — REPAIR, ROTATOR CUFF, OPEN
Anesthesia: Regional | Site: Shoulder | Laterality: Left

## 2018-12-16 MED ORDER — DICYCLOMINE HCL 10 MG PO CAPS
10.0000 mg | ORAL_CAPSULE | ORAL | Status: DC
  Administered 2018-12-17: 10 mg via ORAL
  Filled 2018-12-16: qty 1

## 2018-12-16 MED ORDER — NITROGLYCERIN 0.4 MG SL SUBL: 0.4000 mg | SUBLINGUAL_TABLET | SUBLINGUAL | Status: DC | PRN

## 2018-12-16 MED ORDER — METOCLOPRAMIDE HCL 5 MG PO TABS: 5.0000 mg | ORAL_TABLET | Freq: Three times a day (TID) | ORAL | Status: DC | PRN

## 2018-12-16 MED ORDER — DIPHENHYDRAMINE HCL 50 MG/ML IJ SOLN
INTRAMUSCULAR | Status: DC | PRN
  Administered 2018-12-16: 12.5 mg via INTRAVENOUS

## 2018-12-16 MED ORDER — HYDROCODONE-ACETAMINOPHEN 7.5-325 MG PO TABS: 1.0000 | ORAL_TABLET | ORAL | Status: DC | PRN

## 2018-12-16 MED ORDER — FENTANYL CITRATE (PF) 100 MCG/2ML IJ SOLN
100.0000 ug | Freq: Once | INTRAMUSCULAR | Status: AC
  Administered 2018-12-16: 11:00:00 100 ug via INTRAVENOUS

## 2018-12-16 MED ORDER — SERTRALINE HCL 50 MG PO TABS
50.0000 mg | ORAL_TABLET | Freq: Every day | ORAL | Status: DC
  Administered 2018-12-17: 50 mg via ORAL
  Filled 2018-12-16: qty 1

## 2018-12-16 MED ORDER — CEFAZOLIN SODIUM-DEXTROSE 2-4 GM/100ML-% IV SOLN
2.0000 g | Freq: Three times a day (TID) | INTRAVENOUS | Status: DC
  Administered 2018-12-16 – 2018-12-17 (×2): 2 g via INTRAVENOUS
  Filled 2018-12-16 (×3): qty 100

## 2018-12-16 MED ORDER — LACTATED RINGERS IV SOLN
INTRAVENOUS | Status: DC
  Administered 2018-12-16 (×2): via INTRAVENOUS

## 2018-12-16 MED ORDER — MIDAZOLAM HCL 2 MG/2ML IJ SOLN
2.0000 mg | Freq: Once | INTRAMUSCULAR | Status: AC
  Administered 2018-12-16: 11:00:00 2 mg via INTRAVENOUS

## 2018-12-16 MED ORDER — PROPOFOL 10 MG/ML IV BOLUS
INTRAVENOUS | Status: DC | PRN
  Administered 2018-12-16: 200 mg via INTRAVENOUS

## 2018-12-16 MED ORDER — SODIUM CHLORIDE 0.9 % IV SOLN
INTRAVENOUS | Status: DC | PRN
  Administered 2018-12-16: 20 ug/min via INTRAVENOUS

## 2018-12-16 MED ORDER — DOCUSATE SODIUM 100 MG PO CAPS
100.0000 mg | ORAL_CAPSULE | Freq: Two times a day (BID) | ORAL | Status: DC
  Administered 2018-12-16 – 2018-12-17 (×2): 100 mg via ORAL
  Filled 2018-12-16 (×2): qty 1

## 2018-12-16 MED ORDER — CEFAZOLIN SODIUM-DEXTROSE 2-4 GM/100ML-% IV SOLN
INTRAVENOUS | Status: AC
  Filled 2018-12-16: qty 100

## 2018-12-16 MED ORDER — SCOPOLAMINE 1 MG/3DAYS TD PT72
MEDICATED_PATCH | TRANSDERMAL | Status: DC | PRN
  Administered 2018-12-16: 1 via TRANSDERMAL

## 2018-12-16 MED ORDER — TRANEXAMIC ACID-NACL 1000-0.7 MG/100ML-% IV SOLN
INTRAVENOUS | Status: DC | PRN
  Administered 2018-12-16: 1000 mg via INTRAVENOUS

## 2018-12-16 MED ORDER — EPHEDRINE SULFATE-NACL 50-0.9 MG/10ML-% IV SOSY
PREFILLED_SYRINGE | INTRAVENOUS | Status: DC | PRN
  Administered 2018-12-16: 10 mg via INTRAVENOUS
  Administered 2018-12-16: 5 mg via INTRAVENOUS
  Administered 2018-12-16 (×2): 10 mg via INTRAVENOUS
  Administered 2018-12-16 (×3): 5 mg via INTRAVENOUS

## 2018-12-16 MED ORDER — METOCLOPRAMIDE HCL 5 MG/ML IJ SOLN: 5.0000 mg | Freq: Three times a day (TID) | INTRAMUSCULAR | Status: DC | PRN

## 2018-12-16 MED ORDER — CHLORHEXIDINE GLUCONATE 4 % EX LIQD: 60.0000 mL | Freq: Once | CUTANEOUS | Status: DC

## 2018-12-16 MED ORDER — GLYCOPYRROLATE 0.2 MG/ML IJ SOLN
INTRAMUSCULAR | Status: DC | PRN
  Administered 2018-12-16: 0.2 mg via INTRAVENOUS

## 2018-12-16 MED ORDER — PROPOFOL 10 MG/ML IV BOLUS
INTRAVENOUS | Status: AC
  Filled 2018-12-16: qty 20

## 2018-12-16 MED ORDER — ALBUTEROL SULFATE HFA 108 (90 BASE) MCG/ACT IN AERS: 2.0000 | INHALATION_SPRAY | Freq: Four times a day (QID) | RESPIRATORY_TRACT | Status: DC | PRN

## 2018-12-16 MED ORDER — FENTANYL CITRATE (PF) 250 MCG/5ML IJ SOLN
INTRAMUSCULAR | Status: DC | PRN
  Administered 2018-12-16: 50 ug via INTRAVENOUS

## 2018-12-16 MED ORDER — FENTANYL CITRATE (PF) 100 MCG/2ML IJ SOLN
INTRAMUSCULAR | Status: AC
  Administered 2018-12-16: 100 ug via INTRAVENOUS
  Filled 2018-12-16: qty 2

## 2018-12-16 MED ORDER — DEXAMETHASONE SODIUM PHOSPHATE 10 MG/ML IJ SOLN
INTRAMUSCULAR | Status: DC | PRN
  Administered 2018-12-16: 10 mg via INTRAVENOUS

## 2018-12-16 MED ORDER — PANTOPRAZOLE SODIUM 40 MG PO TBEC
40.0000 mg | DELAYED_RELEASE_TABLET | Freq: Every day | ORAL | Status: DC
  Administered 2018-12-17: 10:00:00 40 mg via ORAL
  Filled 2018-12-16: qty 1

## 2018-12-16 MED ORDER — ONDANSETRON HCL 4 MG PO TABS: 4.0000 mg | ORAL_TABLET | Freq: Four times a day (QID) | ORAL | Status: DC | PRN

## 2018-12-16 MED ORDER — 0.9 % SODIUM CHLORIDE (POUR BTL) OPTIME
TOPICAL | Status: DC | PRN
  Administered 2018-12-16: 2000 mL
  Administered 2018-12-16: 4000 mL
  Administered 2018-12-16: 1000 mL

## 2018-12-16 MED ORDER — BUPIVACAINE LIPOSOME 1.3 % IJ SUSP
INTRAMUSCULAR | Status: DC | PRN
  Administered 2018-12-16: 10 mL via PERINEURAL

## 2018-12-16 MED ORDER — ONDANSETRON HCL 4 MG/2ML IJ SOLN
INTRAMUSCULAR | Status: DC | PRN
  Administered 2018-12-16: 4 mg via INTRAVENOUS

## 2018-12-16 MED ORDER — TEMAZEPAM 7.5 MG PO CAPS
30.0000 mg | ORAL_CAPSULE | Freq: Every day | ORAL | Status: DC
  Administered 2018-12-16: 30 mg via ORAL
  Filled 2018-12-16: qty 4

## 2018-12-16 MED ORDER — VANCOMYCIN HCL 1000 MG IV SOLR
INTRAVENOUS | Status: AC
  Filled 2018-12-16: qty 1000

## 2018-12-16 MED ORDER — ALBUTEROL SULFATE (2.5 MG/3ML) 0.083% IN NEBU: 2.5000 mg | INHALATION_SOLUTION | Freq: Four times a day (QID) | RESPIRATORY_TRACT | Status: DC | PRN

## 2018-12-16 MED ORDER — METHOCARBAMOL 1000 MG/10ML IJ SOLN
500.0000 mg | Freq: Four times a day (QID) | INTRAVENOUS | Status: DC | PRN
  Filled 2018-12-16: qty 5

## 2018-12-16 MED ORDER — ADULT MULTIVITAMIN W/MINERALS CH
1.0000 | ORAL_TABLET | Freq: Every day | ORAL | Status: DC
  Administered 2018-12-17: 1 via ORAL
  Filled 2018-12-16: qty 1

## 2018-12-16 MED ORDER — LACTATED RINGERS IV SOLN
INTRAVENOUS | Status: DC
  Administered 2018-12-16 (×2): via INTRAVENOUS

## 2018-12-16 MED ORDER — SUCCINYLCHOLINE CHLORIDE 20 MG/ML IJ SOLN: INTRAMUSCULAR | Status: DC | PRN

## 2018-12-16 MED ORDER — VANCOMYCIN HCL 1000 MG IV SOLR
INTRAVENOUS | Status: DC | PRN
  Administered 2018-12-16: 1000 mg

## 2018-12-16 MED ORDER — ONDANSETRON HCL 4 MG/2ML IJ SOLN: 4.0000 mg | Freq: Four times a day (QID) | INTRAMUSCULAR | Status: DC | PRN

## 2018-12-16 MED ORDER — TRANEXAMIC ACID-NACL 1000-0.7 MG/100ML-% IV SOLN
INTRAVENOUS | Status: AC
  Filled 2018-12-16: qty 100

## 2018-12-16 MED ORDER — LIDOCAINE 2% (20 MG/ML) 5 ML SYRINGE
INTRAMUSCULAR | Status: DC | PRN
  Administered 2018-12-16: 100 mg via INTRAVENOUS

## 2018-12-16 MED ORDER — EPHEDRINE 5 MG/ML INJ
INTRAVENOUS | Status: AC
  Filled 2018-12-16: qty 10

## 2018-12-16 MED ORDER — SUCCINYLCHOLINE CHLORIDE 200 MG/10ML IV SOSY
PREFILLED_SYRINGE | INTRAVENOUS | Status: DC | PRN
  Administered 2018-12-16: 200 mg via INTRAVENOUS

## 2018-12-16 MED ORDER — MENTHOL 3 MG MT LOZG: 1.0000 | LOZENGE | OROMUCOSAL | Status: DC | PRN

## 2018-12-16 MED ORDER — CEFAZOLIN SODIUM-DEXTROSE 2-4 GM/100ML-% IV SOLN
2.0000 g | INTRAVENOUS | Status: AC
  Administered 2018-12-16: 2 g via INTRAVENOUS

## 2018-12-16 MED ORDER — ALPRAZOLAM 0.5 MG PO TABS
0.5000 mg | ORAL_TABLET | Freq: Every day | ORAL | Status: DC
  Administered 2018-12-16: 22:00:00 0.5 mg via ORAL
  Filled 2018-12-16: qty 1

## 2018-12-16 MED ORDER — METHOCARBAMOL 500 MG PO TABS: 500.0000 mg | ORAL_TABLET | Freq: Four times a day (QID) | ORAL | Status: DC | PRN

## 2018-12-16 MED ORDER — PHENOL 1.4 % MT LIQD: 1.0000 | OROMUCOSAL | Status: DC | PRN

## 2018-12-16 MED ORDER — BUPIVACAINE HCL (PF) 0.5 % IJ SOLN
INTRAMUSCULAR | Status: DC | PRN
  Administered 2018-12-16: 15 mL via PERINEURAL

## 2018-12-16 MED ORDER — FENTANYL CITRATE (PF) 250 MCG/5ML IJ SOLN
INTRAMUSCULAR | Status: AC
  Filled 2018-12-16: qty 5

## 2018-12-16 MED ORDER — MIDAZOLAM HCL 2 MG/2ML IJ SOLN
INTRAMUSCULAR | Status: AC
  Administered 2018-12-16: 2 mg via INTRAVENOUS
  Filled 2018-12-16: qty 2

## 2018-12-16 SURGICAL SUPPLY — 65 items
BEARING HUMERAL +3 40D (Joint) ×2 IMPLANT
BENZOIN TINCTURE PRP APPL 2/3 (GAUZE/BANDAGES/DRESSINGS) ×3 IMPLANT
BLADE SURG 15 STRL LF DISP TIS (BLADE) IMPLANT
BLADE SURG 15 STRL SS (BLADE)
COVER SURGICAL LIGHT HANDLE (MISCELLANEOUS) ×3 IMPLANT
COVER WAND RF STERILE (DRAPES) ×3 IMPLANT
DECANTER SPIKE VIAL GLASS SM (MISCELLANEOUS) IMPLANT
DIAL VERSA SHOULDER 40 STD (Joint) ×2 IMPLANT
DRAPE IMP U-DRAPE 54X76 (DRAPES) ×3 IMPLANT
DRAPE INCISE IOBAN 66X45 STRL (DRAPES) ×3 IMPLANT
DRAPE ORTHO SPLIT 77X108 STRL (DRAPES) ×2
DRAPE SURG ORHT 6 SPLT 77X108 (DRAPES) ×1 IMPLANT
DRAPE U-SHAPE 47X51 STRL (DRAPES) ×3 IMPLANT
DRSG AQUACEL AG ADV 3.5X10 (GAUZE/BANDAGES/DRESSINGS) ×2 IMPLANT
DRSG PAD ABDOMINAL 8X10 ST (GAUZE/BANDAGES/DRESSINGS) ×3 IMPLANT
DURAPREP 26ML APPLICATOR (WOUND CARE) ×3 IMPLANT
ELECT CAUTERY BLADE 6.4 (BLADE) ×3 IMPLANT
ELECT REM PT RETURN 9FT ADLT (ELECTROSURGICAL) ×3
ELECTRODE REM PT RTRN 9FT ADLT (ELECTROSURGICAL) ×1 IMPLANT
GAUZE SPONGE 4X4 12PLY STRL (GAUZE/BANDAGES/DRESSINGS) ×3 IMPLANT
GAUZE XEROFORM 1X8 LF (GAUZE/BANDAGES/DRESSINGS) ×3 IMPLANT
GLOVE BIOGEL PI IND STRL 8 (GLOVE) ×1 IMPLANT
GLOVE BIOGEL PI INDICATOR 8 (GLOVE) ×2
GLOVE SURG ORTHO 8.0 STRL STRW (GLOVE) ×3 IMPLANT
GLOVE SURG SS PI 7.0 STRL IVOR (GLOVE) ×4 IMPLANT
GOWN STRL REUS W/ TWL LRG LVL3 (GOWN DISPOSABLE) ×2 IMPLANT
GOWN STRL REUS W/TWL LRG LVL3 (GOWN DISPOSABLE) ×4
HEAD HUMERAL COMP STD (Orthopedic Implant) IMPLANT
HUMERAL HEAD COMP STD (Orthopedic Implant) ×3 IMPLANT
KIT BASIN OR (CUSTOM PROCEDURE TRAY) ×3 IMPLANT
KIT TURNOVER KIT B (KITS) ×3 IMPLANT
MANIFOLD NEPTUNE II (INSTRUMENTS) ×3 IMPLANT
NDL 1/2 CIR CATGUT .05X1.09 (NEEDLE) IMPLANT
NDL HYPO 25GX1X1/2 BEV (NEEDLE) ×1 IMPLANT
NEEDLE 1/2 CIR CATGUT .05X1.09 (NEEDLE) IMPLANT
NEEDLE HYPO 25GX1X1/2 BEV (NEEDLE) ×3 IMPLANT
NS IRRIG 1000ML POUR BTL (IV SOLUTION) ×3 IMPLANT
PACK SHOULDER (CUSTOM PROCEDURE TRAY) ×3 IMPLANT
PACK UNIVERSAL I (CUSTOM PROCEDURE TRAY) ×3 IMPLANT
PAD ARMBOARD 7.5X6 YLW CONV (MISCELLANEOUS) ×6 IMPLANT
PASSER SUT SWANSON 36MM LOOP (INSTRUMENTS) IMPLANT
SLING ARM IMMOBILIZER MED (SOFTGOODS) ×2 IMPLANT
SPONGE LAP 18X18 X RAY DECT (DISPOSABLE) ×2 IMPLANT
SPONGE LAP 4X18 RFD (DISPOSABLE) ×6 IMPLANT
STAPLER VISISTAT 35W (STAPLE) ×3 IMPLANT
SUCTION FRAZIER HANDLE 10FR (MISCELLANEOUS) ×2
SUCTION TUBE FRAZIER 10FR DISP (MISCELLANEOUS) ×1 IMPLANT
SUT ETHILON 3 0 FSL (SUTURE) ×6 IMPLANT
SUT FIBERWIRE #2 38 T-5 BLUE (SUTURE) ×9
SUT SILK 2 0 TIES 10X30 (SUTURE) ×2 IMPLANT
SUT VIC AB 0 CT1 27 (SUTURE) ×4
SUT VIC AB 0 CT1 27XBRD ANBCTR (SUTURE) ×1 IMPLANT
SUT VIC AB 1 CT1 27 (SUTURE) ×8
SUT VIC AB 1 CT1 27XBRD ANBCTR (SUTURE) IMPLANT
SUT VIC AB 1 CTX 27 (SUTURE) ×3 IMPLANT
SUT VIC AB 2-0 CT1 27 (SUTURE) ×4
SUT VIC AB 2-0 CT1 TAPERPNT 27 (SUTURE) ×1 IMPLANT
SUT VICRYL 0 UR6 27IN ABS (SUTURE) ×4 IMPLANT
SUTURE FIBERWR #2 38 T-5 BLUE (SUTURE) ×3 IMPLANT
SYR CONTROL 10ML LL (SYRINGE) ×3 IMPLANT
TOWEL OR 17X24 6PK STRL BLUE (TOWEL DISPOSABLE) ×3 IMPLANT
TOWEL OR 17X26 10 PK STRL BLUE (TOWEL DISPOSABLE) ×3 IMPLANT
TRAY FOLEY MTR SLVR 16FR STAT (SET/KITS/TRAYS/PACK) ×2 IMPLANT
TRAY HUM MINI SHOULDER +0 40D (Shoulder) ×2 IMPLANT
WATER STERILE IRR 1000ML POUR (IV SOLUTION) ×3 IMPLANT

## 2018-12-16 NOTE — Anesthesia Procedure Notes (Signed)
Procedure Name: Intubation Date/Time: 12/16/2018 1:16 PM Performed by: Lance Coon, CRNA Pre-anesthesia Checklist: Patient identified, Emergency Drugs available, Suction available, Patient being monitored and Timeout performed Patient Re-evaluated:Patient Re-evaluated prior to induction Oxygen Delivery Method: Circle system utilized Preoxygenation: Pre-oxygenation with 100% oxygen Induction Type: IV induction and Rapid sequence Laryngoscope Size: Glidescope and 4 Grade View: Grade I Tube type: Oral Tube size: 7.5 mm Number of attempts: 1 Airway Equipment and Method: Stylet Placement Confirmation: ETT inserted through vocal cords under direct vision,  positive ETCO2 and breath sounds checked- equal and bilateral Secured at: 22 cm Tube secured with: Tape Dental Injury: Teeth and Oropharynx as per pre-operative assessment

## 2018-12-16 NOTE — Brief Op Note (Signed)
12/16/2018  4:26 PM  PATIENT:  [name]  63 y.o. male  PRE-OPERATIVE DIAGNOSIS:  left shoulder subscapularis tear  POST-OPERATIVE DIAGNOSIS:  left shoulder subscapularis tear  PROCEDURE:  Procedure(s): Marland Kitchen REVISION TO REVERSE TOTAL SHOULDER REPLACEMENT REVISION TOTAL SHOULDER TO REVERSE TOTAL SHOULDER  SURGEON:  Surgeon(s): Marlou Sa, Tonna Corner, MD  ASSISTANT: Benita Stabile pa  ANESTHESIA:   general  EBL: 100 ml    Total I/O In: 1000 [I.V.:1000] Out: 1500 [Urine:1500]  BLOOD ADMINISTERED: none  DRAINS: none   LOCAL MEDICATIONS USED:  none  SPECIMEN:  No Specimen  COUNTS:  YES  TOURNIQUET:  * No tourniquets in log *  DICTATION: .Other Dictation: Dictation Number [id number]  PLAN OF CARE: Admit to inpatient   PATIENT DISPOSITION:  PACU - hemodynamically stable

## 2018-12-16 NOTE — Anesthesia Postprocedure Evaluation (Signed)
Anesthesia Post Note  Patient: Christopher [name]  Procedure(s) Performed: LEFT SHOULDER POSSIBLE SUBSCAPULARIS REPAIR VS. REVISION TO REVERSE TOTAL SHOULDER REPLACEMENT (Left Shoulder) REVISION TOTAL SHOULDER TO REVERSE TOTAL SHOULDER (Left Shoulder)     Patient location during evaluation: PACU Anesthesia Type: Regional and General Level of consciousness: awake and alert Pain management: pain level controlled Vital Signs Assessment: post-procedure vital signs reviewed and stable Respiratory status: spontaneous breathing, nonlabored ventilation, respiratory function stable and patient connected to nasal cannula oxygen Cardiovascular status: blood pressure returned to baseline and stable Postop Assessment: no apparent nausea or vomiting Anesthetic complications: no    Last Vitals:  Vitals:   12/16/18 1700 12/16/18 1715  BP: (!) 119/50   Pulse: 89 74  Resp: 17 10  Temp:    SpO2: 93% 93%    Last Pain:  Vitals:   12/16/18 1700  TempSrc:   PainSc: 0-No pain                 Esvin Hnat L Leotha Voeltz

## 2018-12-16 NOTE — H&P (Signed)
Christopher [name] is an 63 y.o. male.   Chief Complaint: Left shoulder pain HPI: Christopher [name] is a 63 year old patient who is about 4 months out left shoulder replacement.  He is doing very well until he tried to help his daughter lift a sofa.  Felt a tearing and pulling sensation in the left arm.  Radiographs demonstrate mild anterior subluxation of the humeral component on the glenoid along with ultrasound examination confirming tear of the subscapularis.  He presents now for attempt at subscapularis repair versus conversion of the total shoulder to a reverse shoulder replacement.  Past Medical History:  Diagnosis Date  . Anxiety   . Arthritis   . Asthma   . Complication of anesthesia    pt had a hard time being able to move after spinal anesthesia , 3-4 hours  . Depression   . GERD (gastroesophageal reflux disease)   . Headache(784.0)    after surgery  . Heart murmur    Years ago- not now  . Hyperlipidemia   . IBS (irritable bowel syndrome)   . PONV (postoperative nausea and vomiting)   . Sleep apnea    uses CIPAP machine at night    Past Surgical History:  Procedure Laterality Date  . APPENDECTOMY    . BACK SURGERY     neck and back fusion  . BIOPSY  12/27/2015   Procedure: BIOPSY;  Surgeon: Rogene Houston, MD;  [facility];  Service: Endoscopy;;  Fundus biopsies and duodenal biopsies  . CARDIAC CATHETERIZATION    . CARDIAC CATHETERIZATION N/A 09/04/2016   Procedure: Right/Left Heart Cath and Coronary Angiography;  Surgeon: Peter M Martinique, MD;  Location: [facility];  Service: Cardiovascular;  Laterality: N/A;  . CHOLECYSTECTOMY    . CHONDROPLASTY  08/15/2011   Procedure: CHONDROPLASTY;  Surgeon: [name], MD;  Location: [facility];  Service: Orthopedics;  Laterality: Left;  . COLONOSCOPY  06/27/2011   Procedure: COLONOSCOPY;  Surgeon: Rogene Houston, MD;  [facility];  Service: Endoscopy;  Laterality: N/A;  9:00 / Pt to be here at 9am for 10:45  procedure, benign polyps removed  . COLONOSCOPY N/A 10/05/2014   Procedure: COLONOSCOPY;  Surgeon: Rogene Houston, MD;  [facility];  Service: Endoscopy;  Laterality: N/A;  930  . COLONOSCOPY N/A 02/11/2018   Procedure: COLONOSCOPY;  Surgeon: Rogene Houston, MD;  [facility];  Service: Endoscopy;  Laterality: N/A;  830  . ESOPHAGOGASTRODUODENOSCOPY N/A 12/27/2015   Procedure: ESOPHAGOGASTRODUODENOSCOPY (EGD);  Surgeon: Rogene Houston, MD;  [facility];  Service: Endoscopy;  Laterality: N/A;  3:00  . HERNIA REPAIR     umbilical hernia  . JOINT REPLACEMENT     left reverse shoulder   . KNEE ARTHROSCOPY     left knee  . KNEE ARTHROSCOPY     right knee   . LUMBAR LAMINECTOMY/DECOMPRESSION MICRODISCECTOMY  09/14/2012   Procedure: LUMBAR LAMINECTOMY/DECOMPRESSION MICRODISCECTOMY 1 LEVEL;  Surgeon: Floyce Stakes, MD;  Location: [facility];  Service: Neurosurgery;  Laterality: Right;  Right Lumbar three-four Diskectomy  . neck fusion    . POLYPECTOMY  02/11/2018   Procedure: POLYPECTOMY;  Surgeon: Rogene Houston, MD;  [facility];  Service: Endoscopy;;  colon  . SHOULDER ARTHROSCOPY WITH BICEPSTENOTOMY Left 09/23/2013   Procedure: SHOULDER ARTHROSCOPY WITH BICEPSTENOTOMY AND EXTENSIVE DEBRIDEMENT;  Surgeon: Carole Civil, MD;  Location: [facility];  Service: Orthopedics;  Laterality: Left;  .  SHOULDER ARTHROSCOPY WITH ROTATOR CUFF REPAIR Left 05/05/2014   Procedure: SHOULDER ARTHROSCOPY LIMITED DEBRIDEMENT;  Surgeon: Carole Civil, MD;  Location: [facility];  Service: Orthopedics;  Laterality: Left;  . SHOULDER OPEN ROTATOR CUFF REPAIR Left 05/05/2014   Procedure: ROTATOR CUFF REPAIR SHOULDER OPEN;  Surgeon: Carole Civil, MD;  Location: [facility];  Service: Orthopedics;  Laterality: Left;  . SHOULDER SURGERY Right    Open Mumford procedure  . SPINAL FUSION     x 2  . TOTAL KNEE ARTHROPLASTY Left 06/16/2017   Procedure: LEFT TOTAL KNEE  ARTHROPLASTY;  Surgeon: Carole Civil, MD;  Location: [facility];  Service: Orthopedics;  Laterality: Left;  . TOTAL SHOULDER ARTHROPLASTY Left 08/19/2018   Procedure: left shoulder replacement;  Surgeon: Meredith Pel, MD;  Location: [facility];  Service: Orthopedics;  Laterality: Left;    Family History  Problem Relation Age of Onset  . Diabetes Other   . Lung disease Other   . Arthritis Other   . Anesthesia problems Neg Hx   . Hypotension Neg Hx   . Malignant hyperthermia Neg Hx   . Pseudochol deficiency Neg Hx    Social History:  reports that he has been smoking cigarettes. He has a 41.00 pack-year smoking history. He has never used smokeless tobacco. He reports that he does not drink alcohol or use drugs.  Allergies:  Allergies  Allergen Reactions  . Celebrex [Celecoxib] Itching, Swelling and Other (See Comments)    All over  . Cortisone Swelling    SWELLING REACTION UNSPECIFIED   . Doxycycline Swelling and Other (See Comments)    Made tongue turn black   . Prednisone Swelling    SWELLING REACTION UNSPECIFIED   . Relafen [Nabumetone] Swelling    SWELLING REACTION UNSPECIFIED   . Codeine Nausea And Vomiting and Other (See Comments)    Extreme stomach pain. This includes anything with the derivative of codeine in it.  . Oxycodone-Acetaminophen Itching and Other (See Comments)    Can not  tolerate with benadryl     Medications Prior to Admission  Medication Sig Dispense Refill  . ALPRAZolam (XANAX) 0.5 MG tablet Take 0.5 mg by mouth at bedtime.     . dicyclomine (BENTYL) 10 MG capsule TAKE 1 CAPSULE THREE TIMES DAILY BEFORE MEALS (Patient taking differently: Take 10 mg by mouth every other day. ) 90 capsule 0  . HYDROcodone-acetaminophen (NORCO) 10-325 MG tablet Take 1 tablet by mouth every 8 (eight) hours. 40 tablet 0  . methocarbamol (ROBAXIN) 500 MG tablet Take 1 tablet (500 mg total) by mouth every 8 (eight) hours as needed for muscle spasms. 30 tablet 0  . Multiple  Vitamin (MULTIVITAMIN WITH MINERALS) TABS tablet Take 1 tablet by mouth daily.    . nitroGLYCERIN (NITROSTAT) 0.4 MG SL tablet Place 1 tablet (0.4 mg total) under the tongue every 5 (five) minutes as needed for chest pain. Please schedule office visit. 15 tablet 0  . pantoprazole (PROTONIX) 40 MG tablet Take 40 mg by mouth daily.     Marland Kitchen PROAIR HFA 108 (90 Base) MCG/ACT inhaler Inhale 2 puffs into the lungs every 6 (six) hours as needed for wheezing or shortness of breath.     . sertraline (ZOLOFT) 50 MG tablet Take 50 mg by mouth daily.     . temazepam (RESTORIL) 30 MG capsule Take 30 mg by mouth at bedtime.     Marland Kitchen aspirin EC 81 MG EC tablet Take 1 tablet (81 mg total)  by mouth 2 (two) times daily. (Patient not taking: Reported on 12/13/2018) 28 tablet 0  . methocarbamol (ROBAXIN) 500 MG tablet Take 1 tablet (500 mg total) by mouth every 6 (six) hours as needed for muscle spasms. (Patient not taking: Reported on 12/13/2018) 30 tablet 0    Results for orders placed or performed during the hospital encounter of 12/16/18 (from the past 48 hour(s))  Urinalysis, Routine w reflex microscopic     Status: None   Collection Time: 12/16/18 10:20 AM  Result Value Ref Range   Color, Urine YELLOW YELLOW   APPearance CLEAR CLEAR   Specific Gravity, Urine 1.015 1.005 - 1.030   pH 6.0 5.0 - 8.0   Glucose, UA NEGATIVE NEGATIVE mg/dL   Hgb urine dipstick NEGATIVE NEGATIVE   Bilirubin Urine NEGATIVE NEGATIVE   Ketones, ur NEGATIVE NEGATIVE mg/dL   Protein, ur NEGATIVE NEGATIVE mg/dL   Nitrite NEGATIVE NEGATIVE   Leukocytes,Ua NEGATIVE NEGATIVE    Comment: Performed at [facility] [address] [place], [place] [address]   No results found.  Review of Systems  Musculoskeletal: Positive for joint pain.  All other systems reviewed and are negative.   Blood pressure (!) 149/66, pulse (!) 50, temperature 97.9 F (36.6 C), temperature source Oral, resp. rate 20, height 5\' 6"  (1.676 m), weight 106.4  kg, SpO2 99 %. Physical Exam  Constitutional: He appears well-developed.  HENT:  Head: Normocephalic.  Eyes: Pupils are equal, round, and reactive to light.  Neck: Normal range of motion.  Cardiovascular: Normal rate.  Respiratory: Effort normal.  Neurological: He is alert.  Skin: Skin is warm.  Psychiatric: He has a normal mood and affect.  Examination of the left shoulder demonstrates external rotation of 15 degrees of abduction to about 80 degrees which is significantly more than what he had immediately postop.  Deltoid is functional.  The shoulder is located.  He does have weakness to belly press testing on the left-hand side.  Incision is intact.  Assessment/Plan Impression is subscapularis tear following 9 indicated exertional event by the patient.  Discussed with him at length both after surgery as well as prior to this event about lifting restrictions.  Nonetheless he does have a subscapularis tear which I would like to try to repair if possible.  If the tissue quality is not great and the repair is not stable then we will have to convert him to a reverse shoulder replacement.  Risk and benefits are discussed including not limited to infection nerve vessel damage potential need for more surgery as well as suboptimal functional outcome.  Patient understands the risk and benefits and wishes to proceed.  All questions answered.  Anderson Malta, MD 12/16/2018, 11:10 AM

## 2018-12-16 NOTE — Op Note (Signed)
NAME: Christopher [name], Christopher [name] MEDICAL RECORD VQ:[id number] ACCOUNT [id number] DATE OF BIRTH:1956/06/29 FACILITY: [facility] LOCATION: [facility] PHYSICIAN:Alayasia Breeding Randel Pigg, MD  OPERATIVE REPORT  DATE OF PROCEDURE:  12/16/2018  PREOPERATIVE DIAGNOSIS:  Left shoulder subscapularis tear with subsequent mild subluxation anteriorly of the left shoulder replacement.  POSTOPERATIVE DIAGNOSIS:  Left shoulder subscapularis tear with subsequent mild subluxation anteriorly of the left shoulder replacement.  PROCEDURE:  Left shoulder evaluation of the subscap tendon with conversion of total shoulder replacement to reverse shoulder replacement.  SURGEON:  Meredith Pel, MD  ASSISTANT:  Erskine Emery, PA-C  INDICATIONS:  The patient is a 63 year old patient who tried lifting a boat 4 months out from his total shoulder replacement and he tore his subscapularis.  He has had pain since then, but no frank instability.  He presents now for operative management  after explanation of risks and benefits.  IMPLANTS UTILIZED:  Humeral head removed from [employer] and applied was the 40 mm Versa-Dial glenosphere standard offset with 0 taper mini-humeral tray standard thickness 40 mm in diameter with +3 thickness highly cross-linked polyethylene bearing.  PROCEDURE IN DETAIL:  The patient was brought to the operating room where general anesthetic was induced.  Preoperative antibiotics were administered.  Timeout was called.  The patient was placed in the beach chair with the head in neutral position.   Left arm, axilla and shoulder were prescrubbed with hydrogen peroxide followed by alcohol and then Betadine, which was allowed to air dry.  DuraPrep was then utilized.  ____ peeled.   At this time, timeout was called.  Prior incision was utilized.  Skin  and subcutaneous tissue were sharply divided.  The interval between the pectoralis and the deltoid was developed.  Bleeding points encountered were controlled using  electrocautery.  Once that plane was developed, manual dissection was performed to  elevate the deltoid distally.  The subdeltoid region was then developed along with the subacromial region.  Care was taken to maintain as much of the deltoid as possible.  Once this space was developed, the subscap was inspected.  The axillary nerve was  palpated at this time, too, and then protected at all times during the case.  The sutures were intact, but the subscap had essentially torn and retracted medially.  There was no quality tissue remaining, and the mobilization was not possible of the  subscap.  At this time, it was elected to convert to reverse shoulder replacement.  The soft tissue was performed off the humeral neck.  The supraspinatus was intact.  Following dissection, the anterior capsule was released off the anterior glenoid.  A  360-degree capsular release was performed with care being taken to avoid injury to the nerve.  At this time, the glenoid spacer was removed.  Thorough irrigation was performed and adequate mobilization of both the circumferential tissue around the  glenoid as well as the tissue around the humerus was confirmed.  Trial baseplate was then placed, 40 mm, and attention was directed towards the humerus.  The stem was well fixed and various size trays were utilized.  The 40 mm glenosphere was then placed  in neutral offset to match the humeral position.  A tray was then placed with more lateral offset as opposed to an inferior offset.  At this time, his arm was taken through a range of motion and found to have excellent stability, both with adduction and  extension and forward flexion.  At this time, trial components were removed and true components placed.  The patient had very good internal and external rotation, forward flexion about 160 and isolated glenohumeral abduction easily past 90.  Shoulder  stability was very good.  Thorough irrigation was performed and vancomycin powder was  placed within the incision.  Deltopectoral interval was then closed using #1 Vicryl suture followed by interrupted inverted 0 Vicryl suture, 2-0 Vicryl suture and 3-0  nylon.  Aquacel dressing was placed.  The patient was placed in a shoulder immobilizer.  He tolerated the procedure well without immediate complications, transferred to the recovery room in stable condition.  Artis Delay Clark's assistance was required at all  times during the case for opening, closing, retraction, dislocation as well as other manual tasks associated with this difficult case.  LN/NUANCE  D:12/16/2018 T:12/16/2018 JOB:[id number]

## 2018-12-16 NOTE — Transfer of Care (Signed)
Immediate Anesthesia Transfer of Care Note  Patient: Christopher [name]  Procedure(s) Performed: LEFT SHOULDER POSSIBLE SUBSCAPULARIS REPAIR VS. REVISION TO REVERSE TOTAL SHOULDER REPLACEMENT (Left Shoulder) REVISION TOTAL SHOULDER TO REVERSE TOTAL SHOULDER (Left Shoulder)  Patient Location: PACU  Anesthesia Type:GA combined with regional for post-op pain  Level of Consciousness: drowsy and patient cooperative  Airway & Oxygen Therapy: Patient Spontanous Breathing and Patient connected to face mask oxygen  Post-op Assessment: Report given to RN and Post -op Vital signs reviewed and stable  Post vital signs: Reviewed and stable  Last Vitals:  Vitals Value Taken Time  BP    Temp    Pulse 74 12/16/2018  4:26 PM  Resp 17 12/16/2018  4:26 PM  SpO2 98 % 12/16/2018  4:26 PM  Vitals shown include unvalidated device data.  Last Pain:  Vitals:   12/16/18 1009  TempSrc:   PainSc: 6       Patients Stated Pain Goal: 2 (76/73/41 [id number])  Complications: No apparent anesthesia complications

## 2018-12-16 NOTE — Anesthesia Procedure Notes (Signed)
Anesthesia Regional Block: Interscalene brachial plexus block   Pre-Anesthetic Checklist: ,, timeout performed, Correct Patient, Correct Site, Correct Laterality, Correct Procedure, Correct Position, site marked, Risks and benefits discussed,  Surgical consent,  Pre-op evaluation,  At surgeon's request and post-op pain management  Laterality: Left  Prep: Maximum Sterile Barrier Precautions used, chloraprep       Needles:  Injection technique: Single-shot  Needle Type: Echogenic Stimulator Needle     Needle Length: 5cm  Needle Gauge: 22     Additional Needles:   Procedures:,,,, ultrasound used (permanent image in chart),,,,  Narrative:  Start time: 12/16/2018 11:08 AM End time: 12/16/2018 11:18 AM Injection made incrementally with aspirations every 5 mL.  Performed by: Personally  Anesthesiologist: Freddrick March, MD  Additional Notes: Monitors applied. No increased pain on injection. No increased resistance to injection. Injection made in 5cc increments. Good needle visualization. Patient tolerated procedure well.

## 2018-12-17 ENCOUNTER — Encounter (HOSPITAL_COMMUNITY): Payer: Self-pay | Admitting: Orthopedic Surgery

## 2018-12-17 LAB — URINE CULTURE: Culture: NO GROWTH

## 2018-12-17 MED ORDER — ALUM & MAG HYDROXIDE-SIMETH 200-200-20 MG/5ML PO SUSP
30.0000 mL | Freq: Once | ORAL | Status: AC
  Administered 2018-12-17: 30 mL via ORAL
  Filled 2018-12-17: qty 30

## 2018-12-17 MED ORDER — HYDROCODONE-ACETAMINOPHEN 10-325 MG PO TABS: 1.0000 | ORAL_TABLET | Freq: Three times a day (TID) | ORAL | 0 refills | Status: DC

## 2018-12-17 MED ORDER — METHOCARBAMOL 500 MG PO TABS: 500.0000 mg | ORAL_TABLET | Freq: Four times a day (QID) | ORAL | 0 refills | Status: DC | PRN

## 2018-12-17 MED ORDER — LIDOCAINE VISCOUS HCL 2 % MT SOLN
15.0000 mL | Freq: Once | OROMUCOSAL | Status: DC
  Filled 2018-12-17 (×2): qty 15

## 2018-12-17 NOTE — Progress Notes (Signed)
Patient for discharge home today.  IV discontinued.  AVS discussed , prescription for Norco given to patient.  Patient is comfortable,  Not in distress waiting on wife for his ride home.

## 2018-12-17 NOTE — Progress Notes (Signed)
Subjective: Patient stable.  Pain reasonably well-controlled.   Objective: Vital signs in last 24 hours: Temp:  [97.5 F (36.4 C)-97.9 F (36.6 C)] 97.7 F (36.5 C) (04/17 0347) Pulse Rate:  [50-95] 58 (04/17 0347) Resp:  [8-21] 18 (04/17 0347) BP: (105-154)/(50-75) 105/51 (04/17 0347) SpO2:  [91 %-99 %] 94 % (04/17 0347) Weight:  [106.4 kg-113.2 kg] 113.2 kg (04/16 2015)  Intake/Output from previous day: 04/16 0701 - 04/17 0700 In: 1000 [I.V.:1000] Out: 2550 [Urine:2500; Blood:50] Intake/Output this shift: No intake/output data recorded.  Exam:  No cellulitis present  Labs: Recent Labs    12/15/18 0836  HGB 13.8   Recent Labs    12/15/18 0836  WBC 9.5  RBC 4.91  HCT 41.7  PLT 221   Recent Labs    12/15/18 0836  NA 139  K 4.2  CL 103  CO2 25  BUN 13  CREATININE 0.99  GLUCOSE 121*  CALCIUM 9.4   No results for input(s): LABPT, INR in the last 72 hours.  Assessment/Plan: Plan at this time is to discharge home later this morning.  He will follow-up with me in about 1 week just so I can check on the incision   G Alphonzo Severance 12/17/2018, 8:26 AM

## 2018-12-18 DIAGNOSIS — Z96619 Presence of unspecified artificial shoulder joint: Secondary | ICD-10-CM

## 2018-12-18 DIAGNOSIS — T84028A Dislocation of other internal joint prosthesis, initial encounter: Secondary | ICD-10-CM

## 2018-12-20 NOTE — Discharge Summary (Signed)
Physician Discharge Summary  Patient ID: Christopher [name] MRN: [id number] DOB/AGE: [birth year] 63 y.o.  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 REVISION TO REVERSE TOTAL SHOULDER REPLACEMENT REVISION TOTAL SHOULDER TO REVERSE TOTAL SHOULDER on 12/16/2018   Consultants:   Discharged Condition: Stable  Hospital Course: ENOC GETTER is an 63 y.o. male who was admitted 12/16/2018 with a chief complaint of left shoulder pain, and found to have a diagnosis of left shoulder subscapularis tear in the setting of total shoulder replacement with subsequent pain and anterior subluxation of the humeral head..  They were brought to the operating room on 12/16/2018 and underwent the above named procedures.  Patient tolerated the procedure well.  He was seen by occupational therapy on postop day #1.  He will continue with fairly aggressive rehabilitation protocol once the incision has healed.  I will see him back in a week and we will discontinue the dressing.  He will undergo 1 hour day of passive range of motion with his home unit until that time.  Antibiotics given:  Anti-infectives (From admission, onward)   Start     Dose/Rate Route Frequency Ordered Stop   12/16/18 2130  ceFAZolin (ANCEF) IVPB 2g/100 mL premix  Status:  Discontinued     2 g 200 mL/hr over 30 Minutes Intravenous Every 8 hours 12/16/18 1800 12/17/18 1613   12/16/18 1509  vancomycin (VANCOCIN) powder  Status:  Discontinued       As needed 12/16/18 1510 12/16/18 1620   12/16/18 1100  ceFAZolin (ANCEF) IVPB 2g/100 mL premix     2 g 200 mL/hr over 30 Minutes Intravenous On call to O.R. 12/16/18 0954 12/16/18 1328   12/16/18 0956  ceFAZolin (ANCEF) 2-4 GM/100ML-% IVPB    Note to Pharmacy:  Gustavo Lah   :  cabinet override      12/16/18 0956 12/16/18 1328    .  Recent vital signs:  Vitals:   12/17/18 0347 12/17/18 0858  BP: (!) 105/51 124/68  Pulse: (!) 58 61  Resp: 18 17  Temp: 97.7 F (36.5 C) 97.6 F (36.4 C)  SpO2: 94% 96%    Recent laboratory studies:  Results for orders placed or performed during the hospital encounter of 12/16/18  Urine culture  Result Value Ref Range   Specimen Description URINE, CLEAN CATCH    Special Requests NONE    Culture      NO GROWTH Performed at [facility], [place] [address], [place], [place] [address]    Report Status 12/17/2018 FINAL   Urinalysis, Routine w reflex microscopic  Result Value Ref Range   Color, Urine YELLOW YELLOW   APPearance CLEAR CLEAR   Specific Gravity, Urine 1.015 1.005 - 1.030   pH 6.0 5.0 - 8.0   Glucose, UA NEGATIVE NEGATIVE mg/dL   Hgb urine dipstick NEGATIVE NEGATIVE   Bilirubin Urine NEGATIVE NEGATIVE   Ketones, ur NEGATIVE NEGATIVE mg/dL   Protein, ur NEGATIVE NEGATIVE mg/dL   Nitrite NEGATIVE NEGATIVE   Leukocytes,Ua NEGATIVE NEGATIVE    Discharge Medications:   Allergies as of 12/17/2018      Reactions   Celebrex [celecoxib] Itching, Swelling, Other (See Comments)   All over   Cortisone Swelling   SWELLING REACTION UNSPECIFIED    Doxycycline Swelling, Other (See Comments)  Made tongue turn black    Prednisone Swelling   SWELLING REACTION UNSPECIFIED    Relafen [nabumetone] Swelling   SWELLING REACTION UNSPECIFIED    Codeine Nausea And Vomiting, Other (See Comments)   Extreme stomach pain. This includes anything with the derivative of codeine in it.   Oxycodone-acetaminophen Itching, Other (See Comments)   Can not  tolerate with benadryl       Medication List    STOP taking these medications   aspirin 81 MG EC tablet   pantoprazole 40 MG tablet Commonly known as:  PROTONIX     TAKE these medications   ALPRAZolam 0.5 MG tablet Commonly known as:  XANAX Take 0.5 mg by mouth at  bedtime.   dicyclomine 10 MG capsule Commonly known as:  BENTYL TAKE 1 CAPSULE THREE TIMES DAILY BEFORE MEALS What changed:  See the new instructions.   HYDROcodone-acetaminophen 10-325 MG tablet Commonly known as:  NORCO Take 1 tablet by mouth every 8 (eight) hours.   methocarbamol 500 MG tablet Commonly known as:  ROBAXIN Take 1 tablet (500 mg total) by mouth every 6 (six) hours as needed for muscle spasms. What changed:  Another medication with the same name was removed. Continue taking this medication, and follow the directions you see here.   multivitamin with minerals Tabs tablet Take 1 tablet by mouth daily.   nitroGLYCERIN 0.4 MG SL tablet Commonly known as:  NITROSTAT Place 1 tablet (0.4 mg total) under the tongue every 5 (five) minutes as needed for chest pain. Please schedule office visit.   ProAir HFA 108 (90 Base) MCG/ACT inhaler Generic drug:  albuterol Inhale 2 puffs into the lungs every 6 (six) hours as needed for wheezing or shortness of breath.   sertraline 50 MG tablet Commonly known as:  ZOLOFT Take 50 mg by mouth daily.   temazepam 30 MG capsule Commonly known as:  RESTORIL Take 30 mg by mouth at bedtime.       Diagnostic Studies: Dg Shoulder Left Port  Result Date: 12/16/2018 CLINICAL DATA:  Status post left shoulder revision EXAM: LEFT SHOULDER - 1 VIEW COMPARISON:  08/19/2018 FINDINGS: Postsurgical changes are noted in the left shoulder. Small amount of air is noted with shoulder joint. No other focal abnormality is noted. IMPRESSION: Postoperative change without acute abnormality. Electronically Signed   By: Inez Catalina M.D.   On: 12/16/2018 21:13    Disposition:   Discharge Instructions    Call MD / Call 911   Complete by:  As directed    If you experience chest pain or shortness of breath, CALL 911 and be transported to the hospital emergency room.  If you develope a fever above 101 F, pus (white drainage) or increased drainage or redness at  the wound, or calf pain, call your surgeon's office.   Constipation Prevention   Complete by:  As directed    Drink plenty of fluids.  Prune juice may be helpful.  You may use a stool softener, such as Colace (over the counter) 100 mg twice a day.  Use MiraLax (over the counter) for constipation as needed.   Diet - low sodium heart healthy   Complete by:  As directed    Discharge instructions   Complete by:  As directed    Use the motion machine 1 hour a day. No lifting with left arm Use the sling until return office visit Okay to shower dressing waterproof   Increase activity slowly as tolerated   Complete  by:  As directed          Signed: Anderson Malta 12/20/2018, 8:42 AM

## 2018-12-24 ENCOUNTER — Ambulatory Visit (INDEPENDENT_AMBULATORY_CARE_PROVIDER_SITE_OTHER): Payer: PPO

## 2018-12-24 ENCOUNTER — Other Ambulatory Visit: Payer: Self-pay

## 2018-12-24 ENCOUNTER — Ambulatory Visit (INDEPENDENT_AMBULATORY_CARE_PROVIDER_SITE_OTHER): Payer: PPO | Admitting: Orthopedic Surgery

## 2018-12-24 ENCOUNTER — Encounter (INDEPENDENT_AMBULATORY_CARE_PROVIDER_SITE_OTHER): Payer: Self-pay | Admitting: Orthopedic Surgery

## 2018-12-24 DIAGNOSIS — S46812A Strain of other muscles, fascia and tendons at shoulder and upper arm level, left arm, initial encounter: Secondary | ICD-10-CM

## 2018-12-24 NOTE — Progress Notes (Signed)
Post-Op Visit Note   Patient: Christopher [name]           Date of Birth: 1956-04-30           MRN: [id number] Visit Date: 12/24/2018 PCP: Christopher Du, MD   Assessment & Plan:  Chief Complaint: No chief complaint on file.  Visit Diagnoses:  1. Full thickness tear of left subscapularis tendon     Plan: Christopher [name] is a patient is now a week out left shoulder revision of total shoulder replacement to reverse shoulder replacement.  He is been doing well.  On exam he has excellent deltoid strength and improving range of motion.  I am going to have him come back in a week to discontinue the sutures.  Continue with CPM daily about 3 hours a day.  I think all in all the shoulder looks good and it was very stable at the time of surgery.  Follow-Up Instructions: Return in about 1 week (around 12/31/2018).   Orders:  Orders Placed This Encounter  Procedures  . XR Shoulder Left   No orders of the defined types were placed in this encounter.   Imaging: Xr Shoulder Left  Result Date: 12/24/2018 [facility] outlet axillary left shoulder reviewed.  Reverse shoulder replacement in good position alignment with no complicating features.   PMFS History: Patient Active Problem List   Diagnosis Date Noted  . 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 Primary osteoarthritis, left shoulder   . 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  . History of colonic polyps 11/26/2017  . S/P total knee replacement, left 06/16/17 06/16/2017  . Primary osteoarthritis of left knee   . Hyperlipidemia 09/04/2016  . Angina pectoris ([place]) 09/04/2016  . OSA (obstructive sleep apnea) 09/04/2016  . COPD (chronic obstructive pulmonary disease) ([place]) 09/04/2016  . Rotator cuff tear 05/05/2014  . S/P shoulder surgery 09/26/2013  . Arthritis, shoulder region 09/26/2013  . Bursitis, shoulder 09/26/2013  . Synovitis of shoulder 09/26/2013  . Labral tear of shoulder, degenerative 09/26/2013  . Biceps tendon tear 09/26/2013  .  Rotator cuff syndrome of left shoulder 06/21/2013  . Arthritis 06/21/2013  . Patellar tendinitis 03/29/2013  . Effusion of knee joint 03/29/2013  . Bursitis/tendonitis, shoulder 03/10/2013  . Effusion of knee joint, left 08/18/2011  . Knee pain 08/18/2011  . Acute torn meniscus 07/30/2011  . Old torn meniscus of knee 07/30/2011  . ARTHRITIS, LEFT KNEE 10/15/2010  . MEDIAL MENISCUS TEAR, RIGHT 10/15/2010  . HIP PAIN 01/15/2010  . DEGENERATIVE DISC DISEASE, LUMBOSACRAL SPINE W/RADICULOPATHY 01/15/2010  . PLICA SYNDROME 19/41/7408  . DERANGEMENT MENISCUS 07/09/2009  . JOINT EFFUSION, LEFT KNEE 07/09/2009  . KNEE, ARTHRITIS, DEGEN./OSTEO 01/30/2009  . KNEE PAIN 01/30/2009  . ANKLE SPRAIN, RIGHT 11/08/2007   Past Medical History:  Diagnosis Date  . Anxiety   . Arthritis   . Asthma   . Complication of anesthesia    pt had a hard time being able to move after spinal anesthesia , 3-4 hours  . Depression   . GERD (gastroesophageal reflux disease)   . Headache(784.0)    after surgery  . Heart murmur    Years ago- not now  . Hyperlipidemia   . IBS (irritable bowel syndrome)   . PONV (postoperative nausea and vomiting)   . Sleep apnea    uses CIPAP machine at night    Family History  Problem Relation Age of Onset  . Diabetes Other   . Lung disease Other   . Arthritis Other   .  Anesthesia problems Neg Hx   . Hypotension Neg Hx   . Malignant hyperthermia Neg Hx   . Pseudochol deficiency Neg Hx     Past Surgical History:  Procedure Laterality Date  . APPENDECTOMY    . BACK SURGERY     neck and back fusion  . BIOPSY  12/27/2015   Procedure: BIOPSY;  Surgeon: Rogene Houston, MD;  [facility];  Service: Endoscopy;;  Fundus biopsies and duodenal biopsies  . CARDIAC CATHETERIZATION    . CARDIAC CATHETERIZATION N/A 09/04/2016   Procedure: Right/Left Heart Cath and Coronary Angiography;  Surgeon: Peter M Martinique, MD;  Location: [facility];  Service:  Cardiovascular;  Laterality: N/A;  . CHOLECYSTECTOMY    . CHONDROPLASTY  08/15/2011   Procedure: CHONDROPLASTY;  Surgeon: [name], MD;  Location: [facility];  Service: Orthopedics;  Laterality: Left;  . COLONOSCOPY  06/27/2011   Procedure: COLONOSCOPY;  Surgeon: Rogene Houston, MD;  [facility];  Service: Endoscopy;  Laterality: N/A;  9:00 / Pt to be here at 9am for 10:45 procedure, benign polyps removed  . COLONOSCOPY N/A 10/05/2014   Procedure: COLONOSCOPY;  Surgeon: Rogene Houston, MD;  [facility];  Service: Endoscopy;  Laterality: N/A;  930  . COLONOSCOPY N/A 02/11/2018   Procedure: COLONOSCOPY;  Surgeon: Rogene Houston, MD;  [facility];  Service: Endoscopy;  Laterality: N/A;  830  . ESOPHAGOGASTRODUODENOSCOPY N/A 12/27/2015   Procedure: ESOPHAGOGASTRODUODENOSCOPY (EGD);  Surgeon: Rogene Houston, MD;  [facility];  Service: Endoscopy;  Laterality: N/A;  3:00  . HERNIA REPAIR     umbilical hernia  . JOINT REPLACEMENT     left reverse shoulder   . KNEE ARTHROSCOPY     left knee  . KNEE ARTHROSCOPY     right knee   . LUMBAR LAMINECTOMY/DECOMPRESSION MICRODISCECTOMY  09/14/2012   Procedure: LUMBAR LAMINECTOMY/DECOMPRESSION MICRODISCECTOMY 1 LEVEL;  Surgeon: Floyce Stakes, MD;  Location: [facility];  Service: Neurosurgery;  Laterality: Right;  Right Lumbar three-four Diskectomy  . neck fusion    . POLYPECTOMY  02/11/2018   Procedure: POLYPECTOMY;  Surgeon: Rogene Houston, MD;  [facility];  Service: Endoscopy;;  colon  . REVISION TOTAL SHOULDER TO REVERSE TOTAL SHOULDER Left 12/16/2018   Procedure: REVISION TOTAL SHOULDER TO REVERSE TOTAL SHOULDER;  Surgeon: Meredith Pel, MD;  Location: [facility];  Service: Orthopedics;  Laterality: Left;  . SHOULDER ARTHROSCOPY WITH BICEPSTENOTOMY Left 09/23/2013   Procedure: SHOULDER ARTHROSCOPY WITH BICEPSTENOTOMY AND EXTENSIVE DEBRIDEMENT;  Surgeon: Carole Civil, MD;   Location: [facility];  Service: Orthopedics;  Laterality: Left;  . SHOULDER ARTHROSCOPY WITH ROTATOR CUFF REPAIR Left 05/05/2014   Procedure: SHOULDER ARTHROSCOPY LIMITED DEBRIDEMENT;  Surgeon: Carole Civil, MD;  Location: [facility];  Service: Orthopedics;  Laterality: Left;  . SHOULDER OPEN ROTATOR CUFF REPAIR Left 05/05/2014   Procedure: ROTATOR CUFF REPAIR SHOULDER OPEN;  Surgeon: Carole Civil, MD;  Location: [facility];  Service: Orthopedics;  Laterality: Left;  . SHOULDER OPEN ROTATOR CUFF REPAIR Left 12/16/2018   Procedure: LEFT SHOULDER POSSIBLE SUBSCAPULARIS REPAIR VS. REVISION TO REVERSE TOTAL SHOULDER REPLACEMENT;  Surgeon: Meredith Pel, MD;  Location: [facility];  Service: Orthopedics;  Laterality: Left;  . SHOULDER SURGERY Right    Open [place]ford procedure  . SPINAL FUSION     x 2  . TOTAL KNEE ARTHROPLASTY Left 06/16/2017   Procedure: LEFT TOTAL KNEE ARTHROPLASTY;  Surgeon: Carole Civil, MD;  Location: [facility];  Service: Orthopedics;  Laterality: Left;  . TOTAL SHOULDER ARTHROPLASTY Left 08/19/2018   Procedure: left shoulder replacement;  Surgeon: Meredith Pel, MD;  Location: [facility];  Service: Orthopedics;  Laterality: Left;   Social History   Occupational History  . 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 Smoking status: Current Every Day Smoker    Packs/day: 1.00    Years: 41.00    Pack years: 41.00    Types: Cigarettes  . Smokeless tobacco: Never Used  . Tobacco comment: smokes a pack a day. since age 15  Substance and Sexual Activity  . Alcohol use: No    Alcohol/week: 0.0 standard drinks  . Drug use: No  . Sexual activity: Not Currently

## 2018-12-28 ENCOUNTER — Ambulatory Visit (INDEPENDENT_AMBULATORY_CARE_PROVIDER_SITE_OTHER): Payer: PPO | Admitting: Internal Medicine

## 2018-12-28 DIAGNOSIS — F17200 Nicotine dependence, unspecified, uncomplicated: Secondary | ICD-10-CM | POA: Diagnosis not present

## 2018-12-28 DIAGNOSIS — J449 Chronic obstructive pulmonary disease, unspecified: Secondary | ICD-10-CM | POA: Diagnosis not present

## 2018-12-28 DIAGNOSIS — N401 Enlarged prostate with lower urinary tract symptoms: Secondary | ICD-10-CM | POA: Diagnosis not present

## 2018-12-28 DIAGNOSIS — M25512 Pain in left shoulder: Secondary | ICD-10-CM | POA: Diagnosis not present

## 2018-12-31 ENCOUNTER — Other Ambulatory Visit: Payer: Self-pay

## 2018-12-31 ENCOUNTER — Encounter (INDEPENDENT_AMBULATORY_CARE_PROVIDER_SITE_OTHER): Payer: Self-pay | Admitting: Orthopedic Surgery

## 2018-12-31 ENCOUNTER — Ambulatory Visit (INDEPENDENT_AMBULATORY_CARE_PROVIDER_SITE_OTHER): Payer: PPO | Admitting: Orthopedic Surgery

## 2018-12-31 DIAGNOSIS — M19012 Primary osteoarthritis, left shoulder: Secondary | ICD-10-CM

## 2018-12-31 MED ORDER — METHOCARBAMOL 500 MG PO TABS: ORAL_TABLET | ORAL | 0 refills | Status: DC

## 2018-12-31 NOTE — Progress Notes (Addendum)
Post-Op Visit Note   Patient: Christopher [name]           Date of Birth: August 17, 1956           MRN: [id number] Visit Date: 12/31/2018 PCP: Sinda Du, MD   Assessment & Plan:  Chief Complaint:  Chief Complaint  Patient presents with  . Left Shoulder - Follow-up   Visit Diagnoses:  1. Primary osteoarthritis, left shoulder     Plan: [name] is now 2 weeks out from left reverse shoulder replacement.  He is been doing well.  CPM machine is working well up to 90 degrees.  Incision is intact.  Sutures removed.  I want him to continue with only range of motion exercises for the next 4 weeks.  Come back in 4 weeks and we will likely let him advance in his activity.  Still no lifting with that left arm.  Overall he is doing well and his pain is significantly improved.  I did refill Robaxin for him today.  Follow-Up Instructions: Return in about 4 weeks (around 01/28/2019).   Orders:  No orders of the defined types were placed in this encounter.  No orders of the defined types were placed in this encounter.   Imaging: No results found.  PMFS History: Patient Active Problem List   Diagnosis Date Noted  . 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[place])   . Primary osteoarthritis, left shoulder   . 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  . History of colonic polyps 11/26/2017  . S/P total knee replacement, left 06/16/17 06/16/2017  . Primary osteoarthritis of left knee   . Hyperlipidemia 09/04/2016  . Angina pectoris ([place]) 09/04/2016  . OSA (obstructive sleep apnea) 09/04/2016  . COPD (chronic obstructive pulmonary disease) ([place]) 09/04/2016  . Rotator cuff tear 05/05/2014  . S/P shoulder surgery 09/26/2013  . Arthritis, shoulder region 09/26/2013  . Bursitis, shoulder 09/26/2013  . Synovitis of shoulder 09/26/2013  . Labral tear of shoulder, degenerative 09/26/2013  . Biceps tendon tear 09/26/2013  . Rotator cuff syndrome of left shoulder 06/21/2013  . Arthritis 06/21/2013  .  Patellar tendinitis 03/29/2013  . Effusion of knee joint 03/29/2013  . Bursitis/tendonitis, shoulder 03/10/2013  . Effusion of knee joint, left 08/18/2011  . Knee pain 08/18/2011  . Acute torn meniscus 07/30/2011  . Old torn meniscus of knee 07/30/2011  . ARTHRITIS, LEFT KNEE 10/15/2010  . MEDIAL MENISCUS TEAR, RIGHT 10/15/2010  . HIP PAIN 01/15/2010  . DEGENERATIVE DISC DISEASE, LUMBOSACRAL SPINE W/RADICULOPATHY 01/15/2010  . PLICA SYNDROME 54/62/7035  . DERANGEMENT MENISCUS 07/09/2009  . JOINT EFFUSION, LEFT KNEE 07/09/2009  . KNEE, ARTHRITIS, DEGEN./OSTEO 01/30/2009  . KNEE PAIN 01/30/2009  . ANKLE SPRAIN, RIGHT 11/08/2007   Past Medical History:  Diagnosis Date  . Anxiety   . Arthritis   . Asthma   . Complication of anesthesia    pt had a hard time being able to move after spinal anesthesia , 3-4 hours  . Depression   . GERD (gastroesophageal reflux disease)   . Headache(784.0)    after surgery  . Heart murmur    Years ago- not now  . Hyperlipidemia   . IBS (irritable bowel syndrome)   . PONV (postoperative nausea and vomiting)   . Sleep apnea    uses CIPAP machine at night    Family History  Problem Relation Age of Onset  . Diabetes Other   . Lung disease Other   . Arthritis Other   . Anesthesia problems Neg Hx   .  Hypotension Neg Hx   . Malignant hyperthermia Neg Hx   . Pseudochol deficiency Neg Hx     Past Surgical History:  Procedure Laterality Date  . APPENDECTOMY    . BACK SURGERY     neck and back fusion  . BIOPSY  12/27/2015   Procedure: BIOPSY;  Surgeon: Rogene Houston, MD;  [facility];  Service: Endoscopy;;  Fundus biopsies and duodenal biopsies  . CARDIAC CATHETERIZATION    . CARDIAC CATHETERIZATION N/A 09/04/2016   Procedure: Right/Left Heart Cath and Coronary Angiography;  Surgeon: Peter M Martinique, MD;  Location: [place] [facility];  Service: Cardiovascular;  Laterality: N/A;  . CHOLECYSTECTOMY    . CHONDROPLASTY  08/15/2011    Procedure: CHONDROPLASTY;  Surgeon: [name], MD;  Location[facility];  Service: Orthopedics;  Laterality: Left;  . COLONOSCOPY  06/27/2011   Procedure: COLONOSCOPY;  Surgeon: Rogene Houston, MD;  [facility];  Service: Endoscopy;  Laterality: N/A;  9:00 / Pt to be here at 9am for 10:45 procedure, benign polyps removed  . COLONOSCOPY N/A 10/05/2014   Procedure: COLONOSCOPY;  Surgeon: Rogene Houston, MD;  [facility];  Service: Endoscopy;  Laterality: N/A;  930  . COLONOSCOPY N/A 02/11/2018   Procedure: COLONOSCOPY;  Surgeon: Rogene Houston, MD;  [facility];  Service: Endoscopy;  Laterality: N/A;  830  . ESOPHAGOGASTRODUODENOSCOPY N/A 12/27/2015   Procedure: ESOPHAGOGASTRODUODENOSCOPY (EGD);  Surgeon: Rogene Houston, MD;  [facility];  Service: Endoscopy;  Laterality: N/A;  3:00  . HERNIA REPAIR     umbilical hernia  . JOINT REPLACEMENT     left reverse shoulder   . KNEE ARTHROSCOPY     left knee  . KNEE ARTHROSCOPY     right knee   . LUMBAR LAMINECTOMY/DECOMPRESSION MICRODISCECTOMY  09/14/2012   Procedure: LUMBAR LAMINECTOMY/DECOMPRESSION MICRODISCECTOMY 1 LEVEL;  Surgeon: Floyce Stakes, MD;  Location: [place] [facility];  Service: Neurosurgery;  Laterality: Right;  Right Lumbar three-four Diskectomy  . neck fusion    . POLYPECTOMY  02/11/2018   Procedure: POLYPECTOMY;  Surgeon: Rogene Houston, MD;  [facility];  Service: Endoscopy;;  colon  . REVISION TOTAL SHOULDER TO REVERSE TOTAL SHOULDER Left 12/16/2018   Procedure: REVISION TOTAL SHOULDER TO REVERSE TOTAL SHOULDER;  Surgeon: Meredith Pel, MD;  Location: [place];  Service: Orthopedics;  Laterality: Left;  . SHOULDER ARTHROSCOPY WITH BICEPSTENOTOMY Left 09/23/2013   Procedure: SHOULDER ARTHROSCOPY WITH BICEPSTENOTOMY AND EXTENSIVE DEBRIDEMENT;  Surgeon: Carole Civil, MD;  [facility];  Service: Orthopedics;  Laterality: Left;  . SHOULDER ARTHROSCOPY WITH  ROTATOR CUFF REPAIR Left 05/05/2014   Procedure: SHOULDER ARTHROSCOPY LIMITED DEBRIDEMENT;  Surgeon: Carole Civil, MD;  [facility];  Service: Orthopedics;  Laterality: Left;  . SHOULDER OPEN ROTATOR CUFF REPAIR Left 05/05/2014   Procedure: ROTATOR CUFF REPAIR SHOULDER OPEN;  Surgeon: Carole Civil, MD;  [facility];  Service: Orthopedics;  Laterality: Left;  . SHOULDER OPEN ROTATOR CUFF REPAIR Left 12/16/2018   Procedure: LEFT SHOULDER POSSIBLE SUBSCAPULARIS REPAIR VS. REVISION TO REVERSE TOTAL SHOULDER REPLACEMENT;  Surgeon: Meredith Pel, MD;  Location: [place];  Service: Orthopedics;  Laterality: Left;  . SHOULDER SURGERY Right    Open Mumford procedure  . SPINAL FUSION     x 2  . TOTAL KNEE ARTHROPLASTY Left 06/16/2017   Procedure: LEFT TOTAL KNEE ARTHROPLASTY;  Surgeon: Carole Civil, MD;  [facility];  Service: Orthopedics;  Laterality: Left;  . TOTAL SHOULDER ARTHROPLASTY Left 08/19/2018   Procedure: left shoulder replacement;  Surgeon: Meredith Pel, MD;  Location: [place];  Service: Orthopedics;  Laterality: Left;   Social History   Occupational History  . 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 Smoking status: Current Every Day Smoker    Packs/day: 1.00    Years: 41.00    Pack years: 41.00    Types: Cigarettes  . Smokeless tobacco: Never Used  . Tobacco comment: smokes a pack a day. since age 80  Substance and Sexual Activity  . Alcohol use: No    Alcohol/week: 0.0 standard drinks  . Drug use: No  . Sexual activity: Not Currently

## 2018-12-31 NOTE — Addendum Note (Signed)
Addended byLaurann Montana on: 12/31/2018 10:32 AM   Modules accepted: Orders

## 2019-01-07 ENCOUNTER — Ambulatory Visit (INDEPENDENT_AMBULATORY_CARE_PROVIDER_SITE_OTHER): Payer: Self-pay | Admitting: Orthopedic Surgery

## 2019-01-27 ENCOUNTER — Telehealth: Payer: Self-pay

## 2019-01-27 NOTE — Telephone Encounter (Signed)
Patient R/S appt to next week. He is needing refills on medications. (Robaxin, Hydrocodone)  CB: [phone]  PHARM: [facility] in [place]

## 2019-01-27 NOTE — Telephone Encounter (Signed)
Please advise. Thanks.  

## 2019-01-28 ENCOUNTER — Ambulatory Visit: Payer: PPO | Admitting: Orthopedic Surgery

## 2019-01-29 MED ORDER — METHOCARBAMOL 500 MG PO TABS: 500.0000 mg | ORAL_TABLET | Freq: Four times a day (QID) | ORAL | 0 refills | Status: DC | PRN

## 2019-01-29 NOTE — Telephone Encounter (Signed)
Ok to rf pls clala thx

## 2019-01-29 NOTE — Addendum Note (Signed)
Addended byLaurann Montana on: 01/29/2019 09:57 PM   Modules accepted: Orders

## 2019-01-29 NOTE — Telephone Encounter (Signed)
Can you please send in [place]. I am unable to do so. I sent in Robaxin.

## 2019-01-31 ENCOUNTER — Other Ambulatory Visit: Payer: Self-pay | Admitting: Internal Medicine

## 2019-01-31 MED ORDER — HYDROCODONE-ACETAMINOPHEN 5-325 MG PO TABS: 1.0000 | ORAL_TABLET | Freq: Three times a day (TID) | ORAL | 0 refills | Status: DC | PRN

## 2019-01-31 NOTE — Telephone Encounter (Signed)
Ok to rf? 

## 2019-01-31 NOTE — Addendum Note (Signed)
Addended by: Marcene Duos on: 01/31/2019 09:46 AM   Modules accepted: Orders

## 2019-02-04 ENCOUNTER — Ambulatory Visit (INDEPENDENT_AMBULATORY_CARE_PROVIDER_SITE_OTHER): Payer: PPO | Admitting: Orthopedic Surgery

## 2019-02-04 ENCOUNTER — Other Ambulatory Visit: Payer: Self-pay

## 2019-02-04 ENCOUNTER — Encounter: Payer: Self-pay | Admitting: Orthopedic Surgery

## 2019-02-04 DIAGNOSIS — M19012 Primary osteoarthritis, left shoulder: Secondary | ICD-10-CM

## 2019-02-08 ENCOUNTER — Encounter: Payer: Self-pay | Admitting: Orthopedic Surgery

## 2019-02-08 NOTE — Progress Notes (Signed)
Post-Op Visit Note   Patient: Christopher [name]           Date of Birth: Apr 17, 1956           MRN: [id number] Visit Date: 02/04/2019 PCP: Christopher Du, MD   Assessment & Plan:  Chief Complaint:  Chief Complaint  Patient presents with  . Left Shoulder - Follow-up   Visit Diagnoses:  1. Primary osteoarthritis, left shoulder     Plan: Christopher [name] is a patient who is now about 6 weeks out left shoulder since conversion to reverse shoulder replacement.  He is been doing well.  On exam he has forward flexion abduction above 90 degrees.  Incision is intact.  He still is having some occasional soreness but in general I think he is doing well following his revision.  I cautioned him to be extremely careful doing any type of heavy lifting with that left arm.  I would like for him to remain on a 15 pound lifting limit essentially for the rest of the year.  I will see him back as needed.  Follow-Up Instructions: Return if symptoms worsen or fail to improve.   Orders:  No orders of the defined types were placed in this encounter.  No orders of the defined types were placed in this encounter.   Imaging: No results found.  PMFS History: Patient Active Problem List   Diagnosis Date Noted  . 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[place])   . Primary osteoarthritis, left shoulder   . 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  . History of colonic polyps 11/26/2017  . S/P total knee replacement, left 06/16/17 06/16/2017  . Primary osteoarthritis of left knee   . Hyperlipidemia 09/04/2016  . Angina pectoris ([place]) 09/04/2016  . OSA (obstructive sleep apnea) 09/04/2016  . COPD (chronic obstructive pulmonary disease) ([place]) 09/04/2016  . Rotator cuff tear 05/05/2014  . S/P shoulder surgery 09/26/2013  . Arthritis, shoulder region 09/26/2013  . Bursitis, shoulder 09/26/2013  . Synovitis of shoulder 09/26/2013  . Labral tear of shoulder, degenerative 09/26/2013  . Biceps tendon tear 09/26/2013  .  Rotator cuff syndrome of left shoulder 06/21/2013  . Arthritis 06/21/2013  . Patellar tendinitis 03/29/2013  . Effusion of knee joint 03/29/2013  . Bursitis/tendonitis, shoulder 03/10/2013  . Effusion of knee joint, left 08/18/2011  . Knee pain 08/18/2011  . Acute torn meniscus 07/30/2011  . Old torn meniscus of knee 07/30/2011  . ARTHRITIS, LEFT KNEE 10/15/2010  . MEDIAL MENISCUS TEAR, RIGHT 10/15/2010  . HIP PAIN 01/15/2010  . DEGENERATIVE DISC DISEASE, LUMBOSACRAL SPINE W/RADICULOPATHY 01/15/2010  . PLICA SYNDROME 72/53/6644  . DERANGEMENT MENISCUS 07/09/2009  . JOINT EFFUSION, LEFT KNEE 07/09/2009  . KNEE, ARTHRITIS, DEGEN./OSTEO 01/30/2009  . KNEE PAIN 01/30/2009  . ANKLE SPRAIN, RIGHT 11/08/2007   Past Medical History:  Diagnosis Date  . Anxiety   . Arthritis   . Asthma   . Complication of anesthesia    pt had a hard time being able to move after spinal anesthesia , 3-4 hours  . Depression   . GERD (gastroesophageal reflux disease)   . Headache(784.0)    after surgery  . Heart murmur    Years ago- not now  . Hyperlipidemia   . IBS (irritable bowel syndrome)   . PONV (postoperative nausea and vomiting)   . Sleep apnea    uses CIPAP machine at night    Family History  Problem Relation Age of Onset  . Diabetes Other   . Lung disease Other   .  Arthritis Other   . Anesthesia problems Neg Hx   . Hypotension Neg Hx   . Malignant hyperthermia Neg Hx   . Pseudochol deficiency Neg Hx     Past Surgical History:  Procedure Laterality Date  . APPENDECTOMY    . BACK SURGERY     neck and back fusion  . BIOPSY  12/27/2015   Procedure: BIOPSY;  Surgeon: Rogene Houston, MD;  [facility];  Service: Endoscopy;;  Fundus biopsies and duodenal biopsies  . CARDIAC CATHETERIZATION    . CARDIAC CATHETERIZATION N/A 09/04/2016   Procedure: Right/Left Heart Cath and Coronary Angiography;  Surgeon: Peter M Martinique, MD;  Location: [place] [facility];  Service:  Cardiovascular;  Laterality: N/A;  . CHOLECYSTECTOMY    . CHONDROPLASTY  08/15/2011   Procedure: CHONDROPLASTY;  Surgeon: [name], MD;  [facility];  Service: Orthopedics;  Laterality: Left;  . COLONOSCOPY  06/27/2011   Procedure: COLONOSCOPY;  Surgeon: Rogene Houston, MD;  [facility];  Service: Endoscopy;  Laterality: N/A;  9:00 / Pt to be here at 9am for 10:45 procedure, benign polyps removed  . COLONOSCOPY N/A 10/05/2014   Procedure: COLONOSCOPY;  Surgeon: Rogene Houston, MD;  [facility];  Service: Endoscopy;  Laterality: N/A;  930  . COLONOSCOPY N/A 02/11/2018   Procedure: COLONOSCOPY;  Surgeon: Rogene Houston, MD;  [facility];  Service: Endoscopy;  Laterality: N/A;  830  . ESOPHAGOGASTRODUODENOSCOPY N/A 12/27/2015   Procedure: ESOPHAGOGASTRODUODENOSCOPY (EGD);  Surgeon: Rogene Houston, MD;  [facility];  Service: Endoscopy;  Laterality: N/A;  3:00  . HERNIA REPAIR     umbilical hernia  . JOINT REPLACEMENT     left reverse shoulder   . KNEE ARTHROSCOPY     left knee  . KNEE ARTHROSCOPY     right knee   . LUMBAR LAMINECTOMY/DECOMPRESSION MICRODISCECTOMY  09/14/2012   Procedure: LUMBAR LAMINECTOMY/DECOMPRESSION MICRODISCECTOMY 1 LEVEL;  Surgeon: Floyce Stakes, MD;  Location: [place] [facility];  Service: Neurosurgery;  Laterality: Right;  Right Lumbar three-four Diskectomy  . neck fusion    . POLYPECTOMY  02/11/2018   Procedure: POLYPECTOMY;  Surgeon: Rogene Houston, MD;  [facility];  Service: Endoscopy;;  colon  . REVISION TOTAL SHOULDER TO REVERSE TOTAL SHOULDER Left 12/16/2018   Procedure: REVISION TOTAL SHOULDER TO REVERSE TOTAL SHOULDER;  Surgeon: Meredith Pel, MD;  Location: [place];  Service: Orthopedics;  Laterality: Left;  . SHOULDER ARTHROSCOPY WITH BICEPSTENOTOMY Left 09/23/2013   Procedure: SHOULDER ARTHROSCOPY WITH BICEPSTENOTOMY AND EXTENSIVE DEBRIDEMENT;  Surgeon: Carole Civil, MD;   [facility];  Service: Orthopedics;  Laterality: Left;  . SHOULDER ARTHROSCOPY WITH ROTATOR CUFF REPAIR Left 05/05/2014   Procedure: SHOULDER ARTHROSCOPY LIMITED DEBRIDEMENT;  Surgeon: Carole Civil, MD;  [facility];  Service: Orthopedics;  Laterality: Left;  . SHOULDER OPEN ROTATOR CUFF REPAIR Left 05/05/2014   Procedure: ROTATOR CUFF REPAIR SHOULDER OPEN;  Surgeon: Carole Civil, MD;  [facility];  Service: Orthopedics;  Laterality: Left;  . SHOULDER OPEN ROTATOR CUFF REPAIR Left 12/16/2018   Procedure: LEFT SHOULDER POSSIBLE SUBSCAPULARIS REPAIR VS. REVISION TO REVERSE TOTAL SHOULDER REPLACEMENT;  Surgeon: Meredith Pel, MD;  Location: [place];  Service: Orthopedics;  Laterality: Left;  . SHOULDER SURGERY Right    Open Mumford procedure  . SPINAL FUSION     x 2  . TOTAL KNEE ARTHROPLASTY Left 06/16/2017  Procedure: LEFT TOTAL KNEE ARTHROPLASTY;  Surgeon: Carole Civil, MD;  [facility];  Service: Orthopedics;  Laterality: Left;  . TOTAL SHOULDER ARTHROPLASTY Left 08/19/2018   Procedure: left shoulder replacement;  Surgeon: Meredith Pel, MD;  Location: [place];  Service: Orthopedics;  Laterality: Left;   Social History   Occupational History  . 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 Smoking status: Current Every Day Smoker    Packs/day: 1.00    Years: 41.00    Pack years: 41.00    Types: Cigarettes  . Smokeless tobacco: Never Used  . Tobacco comment: smokes a pack a day. since age 32  Substance and Sexual Activity  . Alcohol use: No    Alcohol/week: 0.0 standard drinks  . Drug use: No  . Sexual activity: Not Currently

## 2019-02-28 DIAGNOSIS — I10 Essential (primary) hypertension: Secondary | ICD-10-CM | POA: Diagnosis not present

## 2019-02-28 DIAGNOSIS — G4733 Obstructive sleep apnea (adult) (pediatric): Secondary | ICD-10-CM | POA: Diagnosis not present

## 2019-02-28 DIAGNOSIS — J449 Chronic obstructive pulmonary disease, unspecified: Secondary | ICD-10-CM | POA: Diagnosis not present

## 2019-02-28 DIAGNOSIS — M25512 Pain in left shoulder: Secondary | ICD-10-CM | POA: Diagnosis not present

## 2019-03-03 ENCOUNTER — Other Ambulatory Visit (INDEPENDENT_AMBULATORY_CARE_PROVIDER_SITE_OTHER): Payer: Self-pay | Admitting: Internal Medicine

## 2019-03-09 ENCOUNTER — Ambulatory Visit: Payer: PPO | Admitting: Orthopedic Surgery

## 2019-03-16 ENCOUNTER — Other Ambulatory Visit: Payer: Self-pay

## 2019-03-16 ENCOUNTER — Ambulatory Visit (INDEPENDENT_AMBULATORY_CARE_PROVIDER_SITE_OTHER): Payer: PPO | Admitting: Orthopedic Surgery

## 2019-03-16 ENCOUNTER — Encounter: Payer: Self-pay | Admitting: Orthopedic Surgery

## 2019-03-16 ENCOUNTER — Ambulatory Visit (INDEPENDENT_AMBULATORY_CARE_PROVIDER_SITE_OTHER): Payer: PPO

## 2019-03-16 DIAGNOSIS — M25512 Pain in left shoulder: Secondary | ICD-10-CM | POA: Diagnosis not present

## 2019-03-16 NOTE — Progress Notes (Signed)
Post-Op Visit Note   Patient: Christopher [name]           Date of Birth: 02/09/1956           MRN: [id number] Visit Date: 03/16/2019 PCP: Sinda Du, MD   Assessment & Plan:  Chief Complaint:  Chief Complaint  Patient presents with   Left Shoulder - Pain   Visit Diagnoses:  1. Left shoulder pain, unspecified chronicity     Plan: Juvencio is a patient is about 3 months out conversion of the total shoulder to reverse shoulder.  Was having little bit of pain in the anterior aspect of his shoulder when he was getting out of the pool.  On exam he is got forward flexion abduction both about 90 degrees.  She had pretty good strength in the shoulder.  I do not detect really any mechanical symptoms and x-rays look normal.  Impression is mild crepitus with range of motion but no change in x-rays or strength.  I think he still have to get used to what he can and cannot do with his new shoulder.  I will see him back in 4 months for final check.  Follow-Up Instructions: Return in about 4 months (around 07/17/2019).   Orders:  Orders Placed This Encounter  Procedures   XR Shoulder Left   No orders of the defined types were placed in this encounter.   Imaging: Xr Shoulder Left  Result Date: 03/16/2019 [facility] outlet axillary left shoulder reviewed.  Reverse shoulder replacement in good position alignment with no complicating features.  No dislocation.  Unchanged from prior radiographs   PMFS History: Patient Active Problem List   Diagnosis Date Noted   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[facility])    Primary osteoarthritis, left shoulder    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   History of colonic polyps 11/26/2017   S/P total knee replacement, left 06/16/17 06/16/2017   Primary osteoarthritis of left knee    Hyperlipidemia 09/04/2016   Angina pectoris ([place]) 09/04/2016   OSA (obstructive sleep apnea) 09/04/2016   COPD (chronic obstructive pulmonary disease) ([place]) 09/04/2016    Rotator cuff tear 05/05/2014   S/P shoulder surgery 09/26/2013   Arthritis, shoulder region 09/26/2013   Bursitis, shoulder 09/26/2013   Synovitis of shoulder 09/26/2013   Labral tear of shoulder, degenerative 09/26/2013   Biceps tendon tear 09/26/2013   Rotator cuff syndrome of left shoulder 06/21/2013   Arthritis 06/21/2013   Patellar tendinitis 03/29/2013   Effusion of knee joint 03/29/2013   Bursitis/tendonitis, shoulder 03/10/2013   Effusion of knee joint, left 08/18/2011   Knee pain 08/18/2011   Acute torn meniscus 07/30/2011   Old torn meniscus of knee 07/30/2011   ARTHRITIS, LEFT KNEE 10/15/2010   MEDIAL MENISCUS TEAR, RIGHT 10/15/2010   HIP PAIN 01/15/2010   DEGENERATIVE DISC DISEASE, LUMBOSACRAL SPINE W/RADICULOPATHY 95/28/4132   PLICA SYNDROME 44/09/270   DERANGEMENT MENISCUS 07/09/2009   JOINT EFFUSION, LEFT KNEE 07/09/2009   KNEE, ARTHRITIS, DEGEN./OSTEO 01/30/2009   KNEE PAIN 01/30/2009   ANKLE SPRAIN, RIGHT 11/08/2007   Past Medical History:  Diagnosis Date   Anxiety    Arthritis    Asthma    Complication of anesthesia    pt had a hard time being able to move after spinal anesthesia , 3-4 hours   Depression    GERD (gastroesophageal reflux disease)    Headache(784.0)    after surgery   Heart murmur    Years ago- not now   Hyperlipidemia  IBS (irritable bowel syndrome)    PONV (postoperative nausea and vomiting)    Sleep apnea    uses CIPAP machine at night    Family History  Problem Relation Age of Onset   Diabetes Other    Lung disease Other    Arthritis Other    Anesthesia problems Neg Hx    Hypotension Neg Hx    Malignant hyperthermia Neg Hx    Pseudochol deficiency Neg Hx     Past Surgical History:  Procedure Laterality Date   APPENDECTOMY     BACK SURGERY     neck and back fusion   BIOPSY  12/27/2015   Procedure: BIOPSY;  Surgeon: Rogene Houston, MD;  [facility];  Service:  Endoscopy;;  Fundus biopsies and duodenal biopsies   CARDIAC CATHETERIZATION     CARDIAC CATHETERIZATION N/A 09/04/2016   Procedure: Right/Left Heart Cath and Coronary Angiography;  Surgeon: Peter M Martinique, MD;  Location: [place] [facility];  Service: Cardiovascular;  Laterality: N/A;   CHOLECYSTECTOMY     CHONDROPLASTY  08/15/2011   Procedure: CHONDROPLASTY;  Surgeon: [name], MD;  [facility];  Service: Orthopedics;  Laterality: Left;   COLONOSCOPY  06/27/2011   Procedure: COLONOSCOPY;  Surgeon: Rogene Houston, MD;  [facility];  Service: Endoscopy;  Laterality: N/A;  9:00 / Pt to be here at 9am for 10:45 procedure, benign polyps removed   COLONOSCOPY N/A 10/05/2014   Procedure: COLONOSCOPY;  Surgeon: Rogene Houston, MD;  [facility];  Service: Endoscopy;  Laterality: N/A;  930   COLONOSCOPY N/A 02/11/2018   Procedure: COLONOSCOPY;  Surgeon: Rogene Houston, MD;  [facility];  Service: Endoscopy;  Laterality: N/A;  830   ESOPHAGOGASTRODUODENOSCOPY N/A 12/27/2015   Procedure: ESOPHAGOGASTRODUODENOSCOPY (EGD);  Surgeon: Rogene Houston, MD;  [facility];  Service: Endoscopy;  Laterality: N/A;  3:00   HERNIA REPAIR     umbilical hernia   JOINT REPLACEMENT     left reverse shoulder    KNEE ARTHROSCOPY     left knee   KNEE ARTHROSCOPY     right knee    LUMBAR LAMINECTOMY/DECOMPRESSION MICRODISCECTOMY  09/14/2012   Procedure: LUMBAR LAMINECTOMY/DECOMPRESSION MICRODISCECTOMY 1 LEVEL;  Surgeon: Floyce Stakes, MD;  Location: [facility];  Service: Neurosurgery;  Laterality: Right;  Right Lumbar three-four Diskectomy   neck fusion     POLYPECTOMY  02/11/2018   Procedure: POLYPECTOMY;  Surgeon: Rogene Houston, MD;  [facility];  Service: Endoscopy;;  colon   REVISION TOTAL SHOULDER TO REVERSE TOTAL SHOULDER Left 12/16/2018   Procedure: REVISION TOTAL SHOULDER TO REVERSE TOTAL SHOULDER;  Surgeon: Meredith Pel, MD;  Location: [place];  Service: Orthopedics;  Laterality: Left;   SHOULDER ARTHROSCOPY WITH BICEPSTENOTOMY Left 09/23/2013   Procedure: SHOULDER ARTHROSCOPY WITH BICEPSTENOTOMY AND EXTENSIVE DEBRIDEMENT;  Surgeon: Carole Civil, MD;  [facility];  Service: Orthopedics;  Laterality: Left;   SHOULDER ARTHROSCOPY WITH ROTATOR CUFF REPAIR Left 05/05/2014   Procedure: SHOULDER ARTHROSCOPY LIMITED DEBRIDEMENT;  Surgeon: Carole Civil, MD;  [facility];  Service: Orthopedics;  Laterality: Left;   SHOULDER OPEN ROTATOR CUFF REPAIR Left 05/05/2014   Procedure: ROTATOR CUFF REPAIR SHOULDER OPEN;  Surgeon: Carole Civil, MD;  [facility];  Service: Orthopedics;  Laterality: Left;   SHOULDER OPEN ROTATOR CUFF REPAIR Left 12/16/2018   Procedure: LEFT SHOULDER POSSIBLE SUBSCAPULARIS REPAIR VS. REVISION TO REVERSE  TOTAL SHOULDER REPLACEMENT;  Surgeon: Meredith Pel, MD;  Location: [place];  Service: Orthopedics;  Laterality: Left;   SHOULDER SURGERY Right    Open Mumford procedure   SPINAL FUSION     x 2   TOTAL KNEE ARTHROPLASTY Left 06/16/2017   Procedure: LEFT TOTAL KNEE ARTHROPLASTY;  Surgeon: Carole Civil, MD;  [facility];  Service: Orthopedics;  Laterality: Left;   TOTAL SHOULDER ARTHROPLASTY Left 08/19/2018   Procedure: left shoulder replacement;  Surgeon: Meredith Pel, MD;  Location: [place];  Service: Orthopedics;  Laterality: Left;   Social History   Occupational History   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 since age 15  Substance and Sexual Activity   Alcohol use: No    Alcohol/week: 0.0 standard drinks   Drug use: No   Sexual activity: Not Currently

## 2019-03-22 ENCOUNTER — Other Ambulatory Visit: Payer: Self-pay | Admitting: Orthopedic Surgery

## 2019-03-22 MED ORDER — METHOCARBAMOL 500 MG PO TABS: 500.0000 mg | ORAL_TABLET | Freq: Four times a day (QID) | ORAL | 0 refills | Status: DC | PRN

## 2019-03-22 NOTE — Telephone Encounter (Signed)
Prescription refill request received via fax from [facility] for medication: methocarbamol (ROBAXIN) 500 MG tablet 30 tablet  - chart indicates Dr Marlou Sa had prescribed

## 2019-04-12 ENCOUNTER — Telehealth: Payer: Self-pay | Admitting: Orthopedic Surgery

## 2019-04-12 DIAGNOSIS — M545 Low back pain, unspecified: Secondary | ICD-10-CM

## 2019-04-12 NOTE — Telephone Encounter (Signed)
Please advise thanks.

## 2019-04-12 NOTE — Telephone Encounter (Signed)
Patients's wife calling for patient regarding problems with his lower back.  He has in the past had 2 fusions and spine surgery with Dr. Joya Salm (who is now retired).  Patient would like to know if there is anyone that you could highly recommend.  Patient's wife has seen Dr. Sherwood Gambler, but since patient values Dr. Randel Pigg opinion as a Psychologist, sport and exercise, he is requesting the referral come from Dr. Marlou Sa.    Pt's cb  [phone]

## 2019-04-13 NOTE — Telephone Encounter (Signed)
Referral put in LM advising patient.

## 2019-04-13 NOTE — Telephone Encounter (Signed)
Lets go with Dr. Saintclair Halsted or Dr. Ronnald Ramp

## 2019-05-01 DIAGNOSIS — E119 Type 2 diabetes mellitus without complications: Secondary | ICD-10-CM | POA: Diagnosis not present

## 2019-05-13 DIAGNOSIS — M5416 Radiculopathy, lumbar region: Secondary | ICD-10-CM | POA: Diagnosis not present

## 2019-05-13 DIAGNOSIS — Z9889 Other specified postprocedural states: Secondary | ICD-10-CM | POA: Diagnosis not present

## 2019-05-13 DIAGNOSIS — M4726 Other spondylosis with radiculopathy, lumbar region: Secondary | ICD-10-CM | POA: Diagnosis not present

## 2019-05-13 DIAGNOSIS — M5441 Lumbago with sciatica, right side: Secondary | ICD-10-CM | POA: Diagnosis not present

## 2019-05-13 DIAGNOSIS — Z981 Arthrodesis status: Secondary | ICD-10-CM | POA: Diagnosis not present

## 2019-05-13 DIAGNOSIS — M5136 Other intervertebral disc degeneration, lumbar region: Secondary | ICD-10-CM | POA: Diagnosis not present

## 2019-05-13 DIAGNOSIS — M5126 Other intervertebral disc displacement, lumbar region: Secondary | ICD-10-CM | POA: Diagnosis not present

## 2019-05-16 ENCOUNTER — Other Ambulatory Visit: Payer: Self-pay | Admitting: Neurosurgery

## 2019-05-16 DIAGNOSIS — M5416 Radiculopathy, lumbar region: Secondary | ICD-10-CM

## 2019-05-16 DIAGNOSIS — Z981 Arthrodesis status: Secondary | ICD-10-CM

## 2019-05-17 DIAGNOSIS — Z23 Encounter for immunization: Secondary | ICD-10-CM | POA: Diagnosis not present

## 2019-05-17 DIAGNOSIS — Z0000 Encounter for general adult medical examination without abnormal findings: Secondary | ICD-10-CM | POA: Diagnosis not present

## 2019-05-19 DIAGNOSIS — R739 Hyperglycemia, unspecified: Secondary | ICD-10-CM | POA: Diagnosis not present

## 2019-05-19 DIAGNOSIS — F17200 Nicotine dependence, unspecified, uncomplicated: Secondary | ICD-10-CM | POA: Diagnosis not present

## 2019-05-19 DIAGNOSIS — E785 Hyperlipidemia, unspecified: Secondary | ICD-10-CM | POA: Diagnosis not present

## 2019-05-19 DIAGNOSIS — I10 Essential (primary) hypertension: Secondary | ICD-10-CM | POA: Diagnosis not present

## 2019-05-19 DIAGNOSIS — J449 Chronic obstructive pulmonary disease, unspecified: Secondary | ICD-10-CM | POA: Diagnosis not present

## 2019-05-19 DIAGNOSIS — G4730 Sleep apnea, unspecified: Secondary | ICD-10-CM | POA: Diagnosis not present

## 2019-05-19 DIAGNOSIS — M25512 Pain in left shoulder: Secondary | ICD-10-CM | POA: Diagnosis not present

## 2019-05-19 DIAGNOSIS — K580 Irritable bowel syndrome with diarrhea: Secondary | ICD-10-CM | POA: Diagnosis not present

## 2019-05-19 DIAGNOSIS — Z0000 Encounter for general adult medical examination without abnormal findings: Secondary | ICD-10-CM | POA: Diagnosis not present

## 2019-05-19 DIAGNOSIS — K760 Fatty (change of) liver, not elsewhere classified: Secondary | ICD-10-CM | POA: Diagnosis not present

## 2019-05-19 DIAGNOSIS — N400 Enlarged prostate without lower urinary tract symptoms: Secondary | ICD-10-CM | POA: Diagnosis not present

## 2019-05-19 DIAGNOSIS — Z125 Encounter for screening for malignant neoplasm of prostate: Secondary | ICD-10-CM | POA: Diagnosis not present

## 2019-06-01 ENCOUNTER — Ambulatory Visit
Admission: RE | Admit: 2019-06-01 | Discharge: 2019-06-01 | Disposition: A | Discharge status: Home / Self Care | Payer: PPO | Source: Ambulatory Visit | Attending: Neurosurgery | Admitting: Neurosurgery

## 2019-06-01 DIAGNOSIS — Z981 Arthrodesis status: Secondary | ICD-10-CM

## 2019-06-01 DIAGNOSIS — M5126 Other intervertebral disc displacement, lumbar region: Secondary | ICD-10-CM | POA: Diagnosis not present

## 2019-06-01 DIAGNOSIS — M5416 Radiculopathy, lumbar region: Secondary | ICD-10-CM

## 2019-06-01 MED ORDER — GADOBENATE DIMEGLUMINE 529 MG/ML IV SOLN
20.0000 mL | Freq: Once | INTRAVENOUS | Status: AC | PRN
  Administered 2019-06-01: 20 mL via INTRAVENOUS

## 2019-06-07 ENCOUNTER — Telehealth: Payer: Self-pay | Admitting: Orthopedic Surgery

## 2019-06-07 NOTE — Telephone Encounter (Signed)
Patient/designated party contact, wife Zelphia Cairo, left a voice message earlier today asking about a medication that Dr Aline Brochure prescribed some time ago for gout. Said that patient's medication more recently prescribed by primary care is not helping as much. Asking if Dr Aline Brochure can advise. Aware of appointment scheduled 06/22/19. I returned the call to obtain more information; reached voice mail, left a message to call back.

## 2019-06-07 NOTE — Telephone Encounter (Signed)
What is he taking from PCP ? I can not find what we gave him either? She may be able to contact pharmacy to see what it was, and discuss changing it with primary care. I left message for her to call back.

## 2019-06-08 ENCOUNTER — Other Ambulatory Visit: Payer: Self-pay | Admitting: Neurosurgery

## 2019-06-08 DIAGNOSIS — Z981 Arthrodesis status: Secondary | ICD-10-CM | POA: Diagnosis not present

## 2019-06-08 DIAGNOSIS — M5136 Other intervertebral disc degeneration, lumbar region: Secondary | ICD-10-CM | POA: Diagnosis not present

## 2019-06-08 DIAGNOSIS — M4726 Other spondylosis with radiculopathy, lumbar region: Secondary | ICD-10-CM | POA: Diagnosis not present

## 2019-06-08 DIAGNOSIS — M5126 Other intervertebral disc displacement, lumbar region: Secondary | ICD-10-CM | POA: Diagnosis not present

## 2019-06-08 DIAGNOSIS — Z9889 Other specified postprocedural states: Secondary | ICD-10-CM | POA: Diagnosis not present

## 2019-06-15 ENCOUNTER — Ambulatory Visit: Payer: Self-pay | Admitting: Orthopedic Surgery

## 2019-06-17 NOTE — Pre-Procedure Instructions (Signed)
[place], [place] [address] Phone: [phone] Fax: [phone]      Your procedure is scheduled on Wednesday, October, 28th.  Report to [facility] Main Entrance "A" at 0530 A.M., and check in at the Admitting office.  Call this number if you have problems the morning of surgery:  [phone]  Call [phone] if you have any questions prior to your surgery date Monday-Friday 8am-4pm    Remember:  Do not eat or drink after midnight the night before your surgery     Take these medicines the morning of surgery with A SIP OF WATER   ALPRAZolam (XANAX)  dicyclomine (BENTYL)  pantoprazole (PROTONIX)  sertraline (ZOLOFT)   IF NEEDED:  nitroGLYCERIN (NITROSTAT)  PROAIR HFA inhaler  Please bring all inhalers with you the day of surgery.    7 days prior to surgery STOP taking any Aspirin (unless otherwise instructed by your surgeon), Aleve, Naproxen, Ibuprofen, Motrin, Advil, Goody's, BC's, all herbal medications, fish oil, and all vitamins.    The Morning of Surgery  Do not wear jewelry.  Do not wear lotions, powders, colognes, or deodorant  Men may shave face and neck.  Do not bring valuables to the hospital.  [facility] is not responsible for any belongings or valuables.  If you are a smoker, DO NOT Smoke 24 hours prior to surgery IF you wear a CPAP at night please bring your mask, tubing, and machine the morning of surgery   Remember that you must have someone to transport you home after your surgery, and remain with you for 24 hours if you are discharged the same day.   Contacts, glasses, hearing aids, dentures or bridgework may not be worn into surgery.    Leave your suitcase in the car.  After surgery it may be brought to your room.  For patients admitted to the hospital, discharge time will be determined by your treatment team.  Patients discharged the day of surgery will not be allowed to  drive home.    Special instructions:   [place]- Preparing For Surgery  Before surgery, you can play an important role. Because skin is not sterile, your skin needs to be as free of germs as possible. You can reduce the number of germs on your skin by washing with CHG (chlorahexidine gluconate) Soap before surgery.  CHG is an antiseptic cleaner which kills germs and bonds with the skin to continue killing germs even after washing.    Oral Hygiene is also important to reduce your risk of infection.  Remember - BRUSH YOUR TEETH THE MORNING OF SURGERY WITH YOUR REGULAR TOOTHPASTE  Please do not use if you have an allergy to CHG or antibacterial soaps. If your skin becomes reddened/irritated stop using the CHG.  Do not shave (including legs and underarms) for at least 48 hours prior to first CHG shower. It is OK to shave your face.  Please follow these instructions carefully.   1. Shower the NIGHT BEFORE SURGERY and the MORNING OF SURGERY with CHG Soap.   2. If you chose to wash your hair, wash your hair first as usual with your normal shampoo.  3. After you shampoo, rinse your hair and body thoroughly to remove the shampoo.  4. Use CHG as you would any other liquid soap. You can apply CHG directly to the skin and wash gently with a scrungie or a clean washcloth.  5. Apply the CHG Soap to your body ONLY FROM THE NECK DOWN.  Do not use on open wounds or open sores. Avoid contact with your eyes, ears, mouth and genitals (private parts). Wash Face and genitals (private parts)  with your normal soap.   6. Wash thoroughly, paying special attention to the area where your surgery will be performed.  7. Thoroughly rinse your body with warm water from the neck down.  8. DO NOT shower/wash with your normal soap after using and rinsing off the CHG Soap.  9. Pat yourself dry with a CLEAN TOWEL.  10. Wear CLEAN PAJAMAS to bed the night before surgery, wear comfortable clothes the morning of  surgery  11. Place CLEAN SHEETS on your bed the night of your first shower and DO NOT SLEEP WITH PETS.    Day of Surgery:  Do not apply any deodorants/lotions. Please shower the morning of surgery with the CHG soap  Please wear clean clothes to the hospital/surgery center.   Remember to brush your teeth WITH YOUR REGULAR TOOTHPASTE.   Please read over the following fact sheets that you were given.

## 2019-06-20 ENCOUNTER — Other Ambulatory Visit: Payer: Self-pay

## 2019-06-20 ENCOUNTER — Encounter (HOSPITAL_COMMUNITY): Payer: Self-pay

## 2019-06-20 ENCOUNTER — Encounter (HOSPITAL_COMMUNITY)
Admission: RE | Admit: 2019-06-20 | Discharge: 2019-06-20 | Disposition: A | Discharge status: Home / Self Care | Payer: PPO | Source: Ambulatory Visit | Attending: Neurosurgery | Admitting: Neurosurgery

## 2019-06-20 DIAGNOSIS — Z01812 Encounter for preprocedural laboratory examination: Secondary | ICD-10-CM | POA: Insufficient documentation

## 2019-06-20 DIAGNOSIS — G4733 Obstructive sleep apnea (adult) (pediatric): Secondary | ICD-10-CM | POA: Insufficient documentation

## 2019-06-20 DIAGNOSIS — E785 Hyperlipidemia, unspecified: Secondary | ICD-10-CM | POA: Insufficient documentation

## 2019-06-20 HISTORY — DX: Gout, unspecified: M10.9

## 2019-06-20 HISTORY — DX: Prediabetes: R73.03

## 2019-06-20 HISTORY — DX: Presence of dental prosthetic device (complete) (partial): Z97.2

## 2019-06-20 HISTORY — DX: Benign prostatic hyperplasia without lower urinary tract symptoms: N40.0

## 2019-06-20 LAB — CBC
HCT: 42.5 % (ref 39.0–52.0)
Hemoglobin: 14.1 g/dL (ref 13.0–17.0)
MCH: 28.6 pg (ref 26.0–34.0)
MCHC: 33.2 g/dL (ref 30.0–36.0)
MCV: 86.2 fL (ref 80.0–100.0)
Platelets: 239 10*3/uL (ref 150–400)
RBC: 4.93 MIL/uL (ref 4.22–5.81)
RDW: 14.4 % (ref 11.5–15.5)
WBC: 9.5 10*3/uL (ref 4.0–10.5)
nRBC: 0 % (ref 0.0–0.2)

## 2019-06-20 LAB — BASIC METABOLIC PANEL
Anion gap: 7 (ref 5–15)
BUN: 12 mg/dL (ref 8–23)
CO2: 26 mmol/L (ref 22–32)
Calcium: 9.3 mg/dL (ref 8.9–10.3)
Chloride: 103 mmol/L (ref 98–111)
Creatinine, Ser: 0.86 mg/dL (ref 0.61–1.24)
GFR calc Af Amer: >60 mL/min (ref >60)
GFR calc non Af Amer: >60 mL/min (ref >60)
Glucose, Bld: 133 mg/dL — ABNORMAL HIGH (ref 70–99)
Potassium: 4.1 mmol/L (ref 3.5–5.1)
Sodium: 136 mmol/L (ref 135–145)

## 2019-06-20 LAB — TYPE AND SCREEN
ABO/RH(D): B POS
Antibody Screen: NEGATIVE

## 2019-06-20 LAB — SURGICAL PCR SCREEN
MRSA, PCR: NEGATIVE
Staphylococcus aureus: NEGATIVE

## 2019-06-20 LAB — GLUCOSE, CAPILLARY: Glucose-Capillary: 167 mg/dL — ABNORMAL HIGH (ref 70–99)

## 2019-06-20 NOTE — Pre-Procedure Instructions (Signed)
[place], [place] [address] Phone: [phone] Fax: [phone]      Your procedure is scheduled on Wednesday, October, 28th.  Report to [facility] Main Entrance "A" at 0530 A.M., and check in at the Admitting office.  Call this number if you have problems the morning of surgery:  [phone]  Call [phone] if you have any questions prior to your surgery date Monday-Friday 8am-4pm    Remember:  Do not eat or drink after midnight the night before your surgery    Take these medicines the morning of surgery with A SIP OF WATER   ALPRAZolam (XANAX)  methocarbamol (ROBAXIN) if needed  pantoprazole (PROTONIX)  sertraline (ZOLOFT)   IF NEEDED:  nitroGLYCERIN (NITROSTAT)  PROAIR HFA inhaler  Please bring all inhalers with you the day of surgery.    7 days prior to surgery STOP taking any Aspirin (unless otherwise instructed by your surgeon), Aleve, Naproxen, Ibuprofen, Motrin, Advil, Goody's, BC's, all herbal medications, fish oil, and all vitamins.    The Morning of Surgery  Do not wear jewelry.  Do not wear lotions, powders, colognes, or deodorant  Men may shave face and neck.  Do not bring valuables to the hospital.  [facility] is not responsible for any belongings or valuables.  If you are a smoker, DO NOT Smoke 24 hours prior to surgery IF you wear a CPAP at night please bring your mask, tubing, and machine the morning of surgery   Remember that you must have someone to transport you home after your surgery, and remain with you for 24 hours if you are discharged the same day.  Patients discharged the day of surgery will not be allowed to drive home.    Contacts, glasses, hearing aids, dentures or bridgework may not be worn into surgery.    Leave your suitcase in the car.  After surgery it may be brought to your room.  For patients admitted to the hospital, discharge time will be determined by  your treatment team.  Special instructions:   [place]- Preparing For Surgery  Before surgery, you can play an important role. Because skin is not sterile, your skin needs to be as free of germs as possible. You can reduce the number of germs on your skin by washing with CHG (chlorahexidine gluconate) Soap before surgery.  CHG is an antiseptic cleaner which kills germs and bonds with the skin to continue killing germs even after washing.    Oral Hygiene is also important to reduce your risk of infection.  Remember - BRUSH YOUR TEETH THE MORNING OF SURGERY WITH YOUR REGULAR TOOTHPASTE  Please do not use if you have an allergy to CHG or antibacterial soaps. If your skin becomes reddened/irritated stop using the CHG.  Do not shave (including legs and underarms) for at least 48 hours prior to first CHG shower. It is OK to shave your face.  Please follow these instructions carefully.   1. Shower the NIGHT BEFORE SURGERY and the MORNING OF SURGERY with CHG Soap.   2. If you chose to wash your hair, wash your hair first as usual with your normal shampoo.  3. After you shampoo, rinse your hair and body thoroughly to remove the shampoo.  4. Use CHG as you would any other liquid soap. You can apply CHG directly to the skin and wash gently with a scrungie or a clean washcloth.  5. Apply the CHG Soap to your body ONLY FROM THE NECK DOWN.  Do not use on open wounds or open sores. Avoid contact with your eyes, ears, mouth and genitals (private parts). Wash Face and genitals (private parts)  with your normal soap.   6. Wash thoroughly, paying special attention to the area where your surgery will be performed.  7. Thoroughly rinse your body with warm water from the neck down.  8. DO NOT shower/wash with your normal soap after using and rinsing off the CHG Soap.  9. Pat yourself dry with a CLEAN TOWEL.  10. Wear CLEAN PAJAMAS to bed the night before surgery, wear comfortable clothes the morning of  surgery  11. Place CLEAN SHEETS on your bed the night of your first shower and DO NOT SLEEP WITH PETS.    Day of Surgery:  Do not apply any deodorants/lotions. Please shower the morning of surgery with the CHG soap  Please wear clean clothes to the hospital/surgery center.   Remember to brush your teeth WITH YOUR REGULAR TOOTHPASTE.   Please read over the following fact sheets that you were given.

## 2019-06-20 NOTE — Progress Notes (Signed)
Patient denies shortness of breath, fever, cough and chest pain.  PCP - Dr Sinda Du Cardiologist - Denies  Chest x-ray - Denies EKG - 08/17/18 Stress Test - 01/12/2002 ECHO - Denies Cardiac Cath - 09/04/2016  Sleep Study -Yes CPAP - Uses CPAP nightly  Patient is borderline diabetic, does not check his blood sugar.  Controlled by diet and exercise.  Anesthesia review: Yes.  Coronavirus Screening Have you experienced the following symptoms:  Cough yes/no: No Fever (>100.92F)  yes/no: No Runny nose yes/no: No Sore throat yes/no: No Difficulty breathing/shortness of breath  yes/no: No  Have you traveled in the last 14 days and where? yes/no: No  Covid test scheduled on 06/27/19 at [facility].

## 2019-06-21 NOTE — Progress Notes (Signed)
Error

## 2019-06-22 ENCOUNTER — Ambulatory Visit: Payer: PPO | Admitting: Orthopedic Surgery

## 2019-06-22 ENCOUNTER — Other Ambulatory Visit: Payer: Self-pay | Admitting: Neurosurgery

## 2019-06-22 NOTE — Anesthesia Preprocedure Evaluation (Addendum)
Anesthesia Evaluation  Patient identified by MRN, date of birth, ID band Patient awake    Reviewed: Allergy & Precautions, NPO status , Patient's Chart, lab work & pertinent test results  History of Anesthesia Complications (+) PONV  Airway Mallampati: II  TM Distance: >3 FB     Dental   Pulmonary COPD, Current Smoker and Patient abstained from smoking.,    breath sounds clear to auscultation       Cardiovascular + angina  Rhythm:Regular Rate:Normal     Neuro/Psych    GI/Hepatic Neg liver ROS, GERD  ,  Endo/Other  negative endocrine ROS  Renal/GU negative Renal ROS     Musculoskeletal   Abdominal   Peds  Hematology   Anesthesia Other Findings   Reproductive/Obstetrics                           Anesthesia Physical Anesthesia Plan  ASA: III  Anesthesia Plan: General   Post-op Pain Management:    Induction: Intravenous  PONV Risk Score and Plan: 2 and Ondansetron, Dexamethasone and Midazolam  Airway Management Planned: Oral ETT  Additional Equipment:   Intra-op Plan:   Post-operative Plan:   Informed Consent:   Plan Discussed with:   Anesthesia Plan Comments: (Pt seen by cardiology 2018 for eval of intermittent CP and coronary calcifications seen on CT. Had cath 09/04/2016 showing diffuse mild nonobstructive disease, normal LV function EF 55-65%, normal right heart and LV filling pressures, normal cardiac output. Medical therapy recommended.  Preop labs reviewed, WNL.   EKG 08/17/18: Sinus bradycardia. Rate 50.   ath 09/04/16:  The left ventricular systolic function is normal.  LV end diastolic pressure is normal.  The left ventricular ejection fraction is 55-65% by visual estimate.  LV end diastolic pressure is normal.   1. Diffuse mild nonobstructive disease 2. Normal LV function 3. Normal right heart and LV filling pressures 4. Normal cardiac output.   Plan:  Medical therapy.)      Anesthesia Quick Evaluation

## 2019-06-22 NOTE — Progress Notes (Addendum)
Anesthesia Chart Review: Pt seen by cardiology 2018 for eval of intermittent CP and coronary calcifications seen on CT. Had cath 09/04/2016 showing diffuse mild nonobstructive disease, normal LV function EF 55-65%, normal right heart and LV filling pressures, normal cardiac output. Medical therapy recommended.  Preop labs reviewed, WNL.   EKG 08/17/18: Sinus bradycardia. Rate 50.   ath 09/04/16:  The left ventricular systolic function is normal.  LV end diastolic pressure is normal.  The left ventricular ejection fraction is 55-65% by visual estimate.  LV end diastolic pressure is normal.   1. Diffuse mild nonobstructive disease 2. Normal LV function 3. Normal right heart and LV filling pressures 4. Normal cardiac output.   Plan: Medical therapy.   Wynonia Musty [facility] Short Stay Center/Anesthesiology Phone [phone] 06/22/2019 4:17 PM

## 2019-06-27 ENCOUNTER — Other Ambulatory Visit (HOSPITAL_COMMUNITY)
Admission: RE | Admit: 2019-06-27 | Discharge: 2019-06-27 | Disposition: A | Discharge status: Home / Self Care | Payer: PPO | Source: Ambulatory Visit | Attending: Neurosurgery | Admitting: Neurosurgery

## 2019-06-27 ENCOUNTER — Other Ambulatory Visit (HOSPITAL_COMMUNITY): Payer: PPO

## 2019-06-27 DIAGNOSIS — Z8719 Personal history of other diseases of the digestive system: Secondary | ICD-10-CM | POA: Diagnosis not present

## 2019-06-27 DIAGNOSIS — E785 Hyperlipidemia, unspecified: Secondary | ICD-10-CM | POA: Diagnosis present

## 2019-06-27 DIAGNOSIS — G4733 Obstructive sleep apnea (adult) (pediatric): Secondary | ICD-10-CM | POA: Diagnosis present

## 2019-06-27 DIAGNOSIS — R7303 Prediabetes: Secondary | ICD-10-CM | POA: Diagnosis present

## 2019-06-27 DIAGNOSIS — M4726 Other spondylosis with radiculopathy, lumbar region: Secondary | ICD-10-CM | POA: Diagnosis present

## 2019-06-27 DIAGNOSIS — Y838 Other surgical procedures as the cause of abnormal reaction of the patient, or of later complication, without mention of misadventure at the time of the procedure: Secondary | ICD-10-CM | POA: Diagnosis present

## 2019-06-27 DIAGNOSIS — Y793 Surgical instruments, materials and orthopedic devices (including sutures) associated with adverse incidents: Secondary | ICD-10-CM | POA: Diagnosis present

## 2019-06-27 DIAGNOSIS — Z20828 Contact with and (suspected) exposure to other viral communicable diseases: Secondary | ICD-10-CM | POA: Diagnosis present

## 2019-06-27 DIAGNOSIS — F419 Anxiety disorder, unspecified: Secondary | ICD-10-CM | POA: Diagnosis present

## 2019-06-27 DIAGNOSIS — M960 Pseudarthrosis after fusion or arthrodesis: Secondary | ICD-10-CM | POA: Diagnosis present

## 2019-06-27 DIAGNOSIS — F17210 Nicotine dependence, cigarettes, uncomplicated: Secondary | ICD-10-CM | POA: Diagnosis present

## 2019-06-27 DIAGNOSIS — Z79899 Other long term (current) drug therapy: Secondary | ICD-10-CM | POA: Diagnosis not present

## 2019-06-27 DIAGNOSIS — R519 Headache, unspecified: Secondary | ICD-10-CM | POA: Diagnosis present

## 2019-06-27 DIAGNOSIS — K219 Gastro-esophageal reflux disease without esophagitis: Secondary | ICD-10-CM | POA: Diagnosis present

## 2019-06-27 DIAGNOSIS — Z888 Allergy status to other drugs, medicaments and biological substances status: Secondary | ICD-10-CM | POA: Diagnosis not present

## 2019-06-27 DIAGNOSIS — M5116 Intervertebral disc disorders with radiculopathy, lumbar region: Secondary | ICD-10-CM | POA: Diagnosis present

## 2019-06-27 DIAGNOSIS — M19012 Primary osteoarthritis, left shoulder: Secondary | ICD-10-CM | POA: Diagnosis present

## 2019-06-27 DIAGNOSIS — Z885 Allergy status to narcotic agent status: Secondary | ICD-10-CM | POA: Diagnosis not present

## 2019-06-27 DIAGNOSIS — J449 Chronic obstructive pulmonary disease, unspecified: Secondary | ICD-10-CM | POA: Diagnosis present

## 2019-06-27 DIAGNOSIS — Z981 Arthrodesis status: Secondary | ICD-10-CM | POA: Diagnosis not present

## 2019-06-27 DIAGNOSIS — F329 Major depressive disorder, single episode, unspecified: Secondary | ICD-10-CM | POA: Diagnosis present

## 2019-06-27 DIAGNOSIS — M5136 Other intervertebral disc degeneration, lumbar region: Secondary | ICD-10-CM | POA: Diagnosis not present

## 2019-06-27 DIAGNOSIS — Z833 Family history of diabetes mellitus: Secondary | ICD-10-CM | POA: Diagnosis not present

## 2019-06-27 DIAGNOSIS — M48061 Spinal stenosis, lumbar region without neurogenic claudication: Secondary | ICD-10-CM | POA: Diagnosis present

## 2019-06-27 DIAGNOSIS — M5126 Other intervertebral disc displacement, lumbar region: Secondary | ICD-10-CM | POA: Diagnosis present

## 2019-06-27 DIAGNOSIS — K589 Irritable bowel syndrome without diarrhea: Secondary | ICD-10-CM | POA: Diagnosis present

## 2019-06-27 DIAGNOSIS — M109 Gout, unspecified: Secondary | ICD-10-CM | POA: Diagnosis present

## 2019-06-27 DIAGNOSIS — Z96652 Presence of left artificial knee joint: Secondary | ICD-10-CM | POA: Diagnosis present

## 2019-06-27 LAB — SARS CORONAVIRUS 2 (TAT 6-24 HRS): SARS Coronavirus 2: NEGATIVE

## 2019-06-29 ENCOUNTER — Inpatient Hospital Stay (HOSPITAL_COMMUNITY)
Admission: RE | Admit: 2019-06-29 | Discharge: 2019-06-30 | DRG: 454 | Disposition: A | Discharge status: Home / Self Care | Payer: PPO | Source: Ambulatory Visit | Attending: Neurosurgery | Admitting: Neurosurgery

## 2019-06-29 ENCOUNTER — Encounter (HOSPITAL_COMMUNITY): Payer: Self-pay

## 2019-06-29 ENCOUNTER — Encounter (HOSPITAL_COMMUNITY)
Admission: RE | Disposition: A | Discharge status: Home / Self Care | Payer: Self-pay | Source: Ambulatory Visit | Attending: Neurosurgery

## 2019-06-29 ENCOUNTER — Other Ambulatory Visit: Payer: Self-pay

## 2019-06-29 ENCOUNTER — Inpatient Hospital Stay (HOSPITAL_COMMUNITY): Payer: PPO

## 2019-06-29 ENCOUNTER — Inpatient Hospital Stay (HOSPITAL_COMMUNITY): Payer: PPO | Admitting: Vascular Surgery

## 2019-06-29 ENCOUNTER — Inpatient Hospital Stay (HOSPITAL_COMMUNITY): Payer: PPO | Admitting: Certified Registered Nurse Anesthetist

## 2019-06-29 DIAGNOSIS — Z981 Arthrodesis status: Secondary | ICD-10-CM

## 2019-06-29 DIAGNOSIS — M960 Pseudarthrosis after fusion or arthrodesis: Secondary | ICD-10-CM | POA: Diagnosis present

## 2019-06-29 DIAGNOSIS — R7303 Prediabetes: Secondary | ICD-10-CM | POA: Diagnosis present

## 2019-06-29 DIAGNOSIS — Z20828 Contact with and (suspected) exposure to other viral communicable diseases: Secondary | ICD-10-CM | POA: Diagnosis present

## 2019-06-29 DIAGNOSIS — Y793 Surgical instruments, materials and orthopedic devices (including sutures) associated with adverse incidents: Secondary | ICD-10-CM | POA: Diagnosis present

## 2019-06-29 DIAGNOSIS — M48061 Spinal stenosis, lumbar region without neurogenic claudication: Secondary | ICD-10-CM | POA: Diagnosis present

## 2019-06-29 DIAGNOSIS — Z79899 Other long term (current) drug therapy: Secondary | ICD-10-CM

## 2019-06-29 DIAGNOSIS — M5136 Other intervertebral disc degeneration, lumbar region: Secondary | ICD-10-CM | POA: Diagnosis not present

## 2019-06-29 DIAGNOSIS — R519 Headache, unspecified: Secondary | ICD-10-CM | POA: Diagnosis present

## 2019-06-29 DIAGNOSIS — Z888 Allergy status to other drugs, medicaments and biological substances status: Secondary | ICD-10-CM | POA: Diagnosis not present

## 2019-06-29 DIAGNOSIS — Z885 Allergy status to narcotic agent status: Secondary | ICD-10-CM

## 2019-06-29 DIAGNOSIS — Z96652 Presence of left artificial knee joint: Secondary | ICD-10-CM | POA: Diagnosis present

## 2019-06-29 DIAGNOSIS — J449 Chronic obstructive pulmonary disease, unspecified: Secondary | ICD-10-CM | POA: Diagnosis present

## 2019-06-29 DIAGNOSIS — M5126 Other intervertebral disc displacement, lumbar region: Secondary | ICD-10-CM | POA: Diagnosis present

## 2019-06-29 DIAGNOSIS — M109 Gout, unspecified: Secondary | ICD-10-CM | POA: Diagnosis present

## 2019-06-29 DIAGNOSIS — Z833 Family history of diabetes mellitus: Secondary | ICD-10-CM

## 2019-06-29 DIAGNOSIS — K219 Gastro-esophageal reflux disease without esophagitis: Secondary | ICD-10-CM | POA: Diagnosis present

## 2019-06-29 DIAGNOSIS — Y838 Other surgical procedures as the cause of abnormal reaction of the patient, or of later complication, without mention of misadventure at the time of the procedure: Secondary | ICD-10-CM | POA: Diagnosis present

## 2019-06-29 DIAGNOSIS — M5116 Intervertebral disc disorders with radiculopathy, lumbar region: Secondary | ICD-10-CM | POA: Diagnosis present

## 2019-06-29 DIAGNOSIS — Z8719 Personal history of other diseases of the digestive system: Secondary | ICD-10-CM | POA: Diagnosis not present

## 2019-06-29 DIAGNOSIS — E785 Hyperlipidemia, unspecified: Secondary | ICD-10-CM | POA: Diagnosis present

## 2019-06-29 DIAGNOSIS — F17210 Nicotine dependence, cigarettes, uncomplicated: Secondary | ICD-10-CM | POA: Diagnosis present

## 2019-06-29 DIAGNOSIS — Z8261 Family history of arthritis: Secondary | ICD-10-CM

## 2019-06-29 DIAGNOSIS — M4726 Other spondylosis with radiculopathy, lumbar region: Secondary | ICD-10-CM | POA: Diagnosis present

## 2019-06-29 DIAGNOSIS — K589 Irritable bowel syndrome without diarrhea: Secondary | ICD-10-CM | POA: Diagnosis present

## 2019-06-29 DIAGNOSIS — Z419 Encounter for procedure for purposes other than remedying health state, unspecified: Secondary | ICD-10-CM

## 2019-06-29 DIAGNOSIS — G4733 Obstructive sleep apnea (adult) (pediatric): Secondary | ICD-10-CM | POA: Diagnosis present

## 2019-06-29 DIAGNOSIS — F419 Anxiety disorder, unspecified: Secondary | ICD-10-CM | POA: Diagnosis present

## 2019-06-29 DIAGNOSIS — M19012 Primary osteoarthritis, left shoulder: Secondary | ICD-10-CM | POA: Diagnosis present

## 2019-06-29 DIAGNOSIS — F329 Major depressive disorder, single episode, unspecified: Secondary | ICD-10-CM | POA: Diagnosis present

## 2019-06-29 DIAGNOSIS — I209 Angina pectoris, unspecified: Secondary | ICD-10-CM | POA: Diagnosis present

## 2019-06-29 SURGERY — POSTERIOR LUMBAR FUSION 1 LEVEL
Anesthesia: General | Site: Spine Lumbar

## 2019-06-29 MED ORDER — FENTANYL CITRATE (PF) 250 MCG/5ML IJ SOLN
INTRAMUSCULAR | Status: AC
  Filled 2019-06-29: qty 5

## 2019-06-29 MED ORDER — ALBUTEROL SULFATE (2.5 MG/3ML) 0.083% IN NEBU: 2.5000 mg | INHALATION_SOLUTION | Freq: Four times a day (QID) | RESPIRATORY_TRACT | Status: DC | PRN

## 2019-06-29 MED ORDER — METHOCARBAMOL 500 MG PO TABS
500.0000 mg | ORAL_TABLET | Freq: Four times a day (QID) | ORAL | Status: DC | PRN
  Administered 2019-06-29: 500 mg via ORAL
  Filled 2019-06-29: qty 1

## 2019-06-29 MED ORDER — KCL IN DEXTROSE-NACL 20-5-0.45 MEQ/L-%-% IV SOLN: INTRAVENOUS | Status: DC

## 2019-06-29 MED ORDER — PHENYLEPHRINE HCL (PRESSORS) 10 MG/ML IV SOLN
INTRAVENOUS | Status: DC | PRN
  Administered 2019-06-29 (×3): 80 ug via INTRAVENOUS

## 2019-06-29 MED ORDER — HYDROCODONE-ACETAMINOPHEN 5-325 MG PO TABS
1.0000 | ORAL_TABLET | ORAL | Status: DC | PRN
  Administered 2019-06-29 – 2019-06-30 (×5): 2 via ORAL
  Filled 2019-06-29 (×5): qty 2

## 2019-06-29 MED ORDER — MIDAZOLAM HCL 2 MG/2ML IJ SOLN
INTRAMUSCULAR | Status: AC
  Filled 2019-06-29: qty 2

## 2019-06-29 MED ORDER — MAGNESIUM HYDROXIDE 400 MG/5ML PO SUSP: 30.0000 mL | Freq: Every day | ORAL | Status: DC | PRN

## 2019-06-29 MED ORDER — CHLORHEXIDINE GLUCONATE CLOTH 2 % EX PADS: 6.0000 | MEDICATED_PAD | Freq: Once | CUTANEOUS | Status: DC

## 2019-06-29 MED ORDER — NITROGLYCERIN 0.4 MG SL SUBL: 0.4000 mg | SUBLINGUAL_TABLET | SUBLINGUAL | Status: DC | PRN

## 2019-06-29 MED ORDER — ACETAMINOPHEN 650 MG RE SUPP: 650.0000 mg | RECTAL | Status: DC | PRN

## 2019-06-29 MED ORDER — ACETAMINOPHEN 10 MG/ML IV SOLN
INTRAVENOUS | Status: AC
  Filled 2019-06-29: qty 100

## 2019-06-29 MED ORDER — KETOROLAC TROMETHAMINE 30 MG/ML IJ SOLN
INTRAMUSCULAR | Status: AC
  Filled 2019-06-29: qty 1

## 2019-06-29 MED ORDER — ONDANSETRON HCL 4 MG/2ML IJ SOLN: 4.0000 mg | Freq: Four times a day (QID) | INTRAMUSCULAR | Status: DC | PRN

## 2019-06-29 MED ORDER — EPHEDRINE SULFATE 50 MG/ML IJ SOLN
INTRAMUSCULAR | Status: DC | PRN
  Administered 2019-06-29 (×2): 10 mg via INTRAVENOUS

## 2019-06-29 MED ORDER — SERTRALINE HCL 50 MG PO TABS
50.0000 mg | ORAL_TABLET | Freq: Every day | ORAL | Status: DC
  Administered 2019-06-29 – 2019-06-30 (×2): 50 mg via ORAL
  Filled 2019-06-29 (×2): qty 1

## 2019-06-29 MED ORDER — CEFAZOLIN SODIUM 1 G IJ SOLR
INTRAMUSCULAR | Status: AC
  Filled 2019-06-29: qty 20

## 2019-06-29 MED ORDER — FENTANYL CITRATE (PF) 100 MCG/2ML IJ SOLN
INTRAMUSCULAR | Status: AC
  Administered 2019-06-29: 25 ug via INTRAVENOUS
  Filled 2019-06-29: qty 2

## 2019-06-29 MED ORDER — LIDOCAINE-EPINEPHRINE 1 %-1:100000 IJ SOLN
INTRAMUSCULAR | Status: AC
  Filled 2019-06-29: qty 1

## 2019-06-29 MED ORDER — ACETAMINOPHEN 325 MG PO TABS: 650.0000 mg | ORAL_TABLET | ORAL | Status: DC | PRN

## 2019-06-29 MED ORDER — LACTATED RINGERS IV SOLN
INTRAVENOUS | Status: DC | PRN
  Administered 2019-06-29 (×2): via INTRAVENOUS

## 2019-06-29 MED ORDER — MIDAZOLAM HCL 5 MG/5ML IJ SOLN
INTRAMUSCULAR | Status: DC | PRN
  Administered 2019-06-29: 2 mg via INTRAVENOUS

## 2019-06-29 MED ORDER — SODIUM CHLORIDE 0.9% FLUSH
3.0000 mL | Freq: Two times a day (BID) | INTRAVENOUS | Status: DC
  Administered 2019-06-29 (×2): 3 mL via INTRAVENOUS

## 2019-06-29 MED ORDER — ACETAMINOPHEN 10 MG/ML IV SOLN
INTRAVENOUS | Status: DC | PRN
  Administered 2019-06-29: 1000 mg via INTRAVENOUS

## 2019-06-29 MED ORDER — THROMBIN 20000 UNITS EX SOLR
CUTANEOUS | Status: DC | PRN
  Administered 2019-06-29: 09:00:00 via TOPICAL

## 2019-06-29 MED ORDER — CYCLOBENZAPRINE HCL 10 MG PO TABS
ORAL_TABLET | ORAL | Status: AC
  Filled 2019-06-29: qty 1

## 2019-06-29 MED ORDER — FENTANYL CITRATE (PF) 100 MCG/2ML IJ SOLN
25.0000 ug | INTRAMUSCULAR | Status: DC | PRN
  Administered 2019-06-29 (×2): 25 ug via INTRAVENOUS
  Administered 2019-06-29: 50 ug via INTRAVENOUS

## 2019-06-29 MED ORDER — ONDANSETRON HCL 4 MG/2ML IJ SOLN
INTRAMUSCULAR | Status: AC
  Filled 2019-06-29: qty 2

## 2019-06-29 MED ORDER — BISACODYL 10 MG RE SUPP: 10.0000 mg | Freq: Every day | RECTAL | Status: DC | PRN

## 2019-06-29 MED ORDER — PHENYLEPHRINE HCL-NACL 10-0.9 MG/250ML-% IV SOLN
INTRAVENOUS | Status: DC | PRN
  Administered 2019-06-29: 20 ug/min via INTRAVENOUS

## 2019-06-29 MED ORDER — HYDROXYZINE HCL 50 MG/ML IM SOLN: 50.0000 mg | INTRAMUSCULAR | Status: DC | PRN

## 2019-06-29 MED ORDER — ONDANSETRON HCL 4 MG/2ML IJ SOLN
INTRAMUSCULAR | Status: DC | PRN
  Administered 2019-06-29: 4 mg via INTRAVENOUS

## 2019-06-29 MED ORDER — CYCLOBENZAPRINE HCL 5 MG PO TABS
5.0000 mg | ORAL_TABLET | Freq: Three times a day (TID) | ORAL | Status: DC | PRN
  Administered 2019-06-29: 10 mg via ORAL

## 2019-06-29 MED ORDER — TEMAZEPAM 15 MG PO CAPS
15.0000 mg | ORAL_CAPSULE | Freq: Every day | ORAL | Status: DC
  Administered 2019-06-29: 15 mg via ORAL
  Filled 2019-06-29: qty 1

## 2019-06-29 MED ORDER — PROPOFOL 10 MG/ML IV BOLUS
INTRAVENOUS | Status: AC
  Filled 2019-06-29: qty 20

## 2019-06-29 MED ORDER — ROCURONIUM BROMIDE 100 MG/10ML IV SOLN
INTRAVENOUS | Status: DC | PRN
  Administered 2019-06-29 (×2): 100 mg via INTRAVENOUS

## 2019-06-29 MED ORDER — 0.9 % SODIUM CHLORIDE (POUR BTL) OPTIME
TOPICAL | Status: DC | PRN
  Administered 2019-06-29 (×2): 1000 mL

## 2019-06-29 MED ORDER — FLEET ENEMA 7-19 GM/118ML RE ENEM: 1.0000 | ENEMA | Freq: Once | RECTAL | Status: DC | PRN

## 2019-06-29 MED ORDER — SODIUM CHLORIDE 0.9 % IV SOLN
INTRAVENOUS | Status: DC | PRN
  Administered 2019-06-29: 09:00:00

## 2019-06-29 MED ORDER — PHENOL 1.4 % MT LIQD: 1.0000 | OROMUCOSAL | Status: DC | PRN

## 2019-06-29 MED ORDER — ALUM & MAG HYDROXIDE-SIMETH 200-200-20 MG/5ML PO SUSP: 30.0000 mL | Freq: Four times a day (QID) | ORAL | Status: DC | PRN

## 2019-06-29 MED ORDER — ALBUTEROL SULFATE HFA 108 (90 BASE) MCG/ACT IN AERS: 2.0000 | INHALATION_SPRAY | Freq: Four times a day (QID) | RESPIRATORY_TRACT | Status: DC | PRN

## 2019-06-29 MED ORDER — SUGAMMADEX SODIUM 200 MG/2ML IV SOLN
INTRAVENOUS | Status: DC | PRN
  Administered 2019-06-29: 400 mg via INTRAVENOUS

## 2019-06-29 MED ORDER — SODIUM CHLORIDE 0.9 % IV SOLN: 250.0000 mL | INTRAVENOUS | Status: DC

## 2019-06-29 MED ORDER — THROMBIN 20000 UNITS EX SOLR
CUTANEOUS | Status: AC
  Filled 2019-06-29: qty 20000

## 2019-06-29 MED ORDER — MORPHINE SULFATE (PF) 4 MG/ML IV SOLN: 4.0000 mg | INTRAVENOUS | Status: DC | PRN

## 2019-06-29 MED ORDER — CEFAZOLIN SODIUM-DEXTROSE 2-4 GM/100ML-% IV SOLN
2.0000 g | INTRAVENOUS | Status: AC
  Administered 2019-06-29 (×2): 2 g via INTRAVENOUS
  Filled 2019-06-29: qty 100

## 2019-06-29 MED ORDER — MENTHOL 3 MG MT LOZG: 1.0000 | LOZENGE | OROMUCOSAL | Status: DC | PRN

## 2019-06-29 MED ORDER — ALPRAZOLAM 0.25 MG PO TABS
0.2500 mg | ORAL_TABLET | Freq: Two times a day (BID) | ORAL | Status: DC
  Administered 2019-06-29 – 2019-06-30 (×2): 0.25 mg via ORAL
  Filled 2019-06-29 (×2): qty 1

## 2019-06-29 MED ORDER — ONDANSETRON HCL 4 MG PO TABS: 4.0000 mg | ORAL_TABLET | Freq: Four times a day (QID) | ORAL | Status: DC | PRN

## 2019-06-29 MED ORDER — ADULT MULTIVITAMIN W/MINERALS CH: 1.0000 | ORAL_TABLET | Freq: Every day | ORAL | Status: DC

## 2019-06-29 MED ORDER — LIDOCAINE 2% (20 MG/ML) 5 ML SYRINGE
INTRAMUSCULAR | Status: AC
  Filled 2019-06-29: qty 5

## 2019-06-29 MED ORDER — PANTOPRAZOLE SODIUM 40 MG PO TBEC
40.0000 mg | DELAYED_RELEASE_TABLET | Freq: Every day | ORAL | Status: DC
  Administered 2019-06-30: 40 mg via ORAL
  Filled 2019-06-29: qty 1

## 2019-06-29 MED ORDER — SODIUM CHLORIDE 0.9% FLUSH: 3.0000 mL | INTRAVENOUS | Status: DC | PRN

## 2019-06-29 MED ORDER — BUPIVACAINE HCL (PF) 0.5 % IJ SOLN
INTRAMUSCULAR | Status: DC | PRN
  Administered 2019-06-29: 15 mL

## 2019-06-29 MED ORDER — LIDOCAINE-EPINEPHRINE 1 %-1:100000 IJ SOLN
INTRAMUSCULAR | Status: DC | PRN
  Administered 2019-06-29: 15 mL

## 2019-06-29 MED ORDER — HYDROXYZINE HCL 25 MG PO TABS: 50.0000 mg | ORAL_TABLET | ORAL | Status: DC | PRN

## 2019-06-29 MED ORDER — KETOROLAC TROMETHAMINE 30 MG/ML IJ SOLN
30.0000 mg | Freq: Four times a day (QID) | INTRAMUSCULAR | Status: DC
  Administered 2019-06-29 – 2019-06-30 (×3): 30 mg via INTRAVENOUS
  Filled 2019-06-29 (×3): qty 1

## 2019-06-29 MED ORDER — KETOROLAC TROMETHAMINE 30 MG/ML IJ SOLN
30.0000 mg | Freq: Once | INTRAMUSCULAR | Status: AC
  Administered 2019-06-29: 30 mg via INTRAVENOUS

## 2019-06-29 MED ORDER — BUPIVACAINE HCL (PF) 0.5 % IJ SOLN
INTRAMUSCULAR | Status: AC
  Filled 2019-06-29: qty 30

## 2019-06-29 MED ORDER — FENTANYL CITRATE (PF) 100 MCG/2ML IJ SOLN
INTRAMUSCULAR | Status: DC | PRN
  Administered 2019-06-29: 100 ug via INTRAVENOUS
  Administered 2019-06-29 (×3): 50 ug via INTRAVENOUS

## 2019-06-29 SURGICAL SUPPLY — 55 items
BAG DECANTER FOR FLEXI CONT (MISCELLANEOUS) ×2 IMPLANT
BENZOIN TINCTURE PRP APPL 2/3 (GAUZE/BANDAGES/DRESSINGS) ×2 IMPLANT
BUR ACRON 5.0MM COATED (BURR) ×2 IMPLANT
BUR MATCHSTICK NEURO 3.0 LAGG (BURR) ×2 IMPLANT
CAGE POST LUM 11X23X9 6D (Cage) ×4 IMPLANT
CANISTER SUCT 3000ML PPV (MISCELLANEOUS) ×2 IMPLANT
CARTRIDGE OIL MAESTRO DRILL (MISCELLANEOUS) ×1 IMPLANT
CONT SPEC 4OZ CLIKSEAL STRL BL (MISCELLANEOUS) ×2 IMPLANT
COVER BACK TABLE 60X90IN (DRAPES) ×2 IMPLANT
COVER WAND RF STERILE (DRAPES) ×2 IMPLANT
DERMABOND ADVANCED (GAUZE/BANDAGES/DRESSINGS) ×1
DERMABOND ADVANCED .7 DNX12 (GAUZE/BANDAGES/DRESSINGS) ×1 IMPLANT
DIFFUSER DRILL AIR PNEUMATIC (MISCELLANEOUS) ×2 IMPLANT
DRAPE C-ARM 42X72 X-RAY (DRAPES) ×2 IMPLANT
DRAPE C-ARMOR (DRAPES) ×2 IMPLANT
DRAPE HALF SHEET 40X57 (DRAPES) IMPLANT
DRAPE LAPAROTOMY 100X72X124 (DRAPES) ×2 IMPLANT
DRAPE POUCH INSTRU U-SHP 10X18 (DRAPES) ×2 IMPLANT
ELECT REM PT RETURN 9FT ADLT (ELECTROSURGICAL) ×2
ELECTRODE REM PT RTRN 9FT ADLT (ELECTROSURGICAL) ×1 IMPLANT
GAUZE 4X4 16PLY RFD (DISPOSABLE) IMPLANT
GAUZE SPONGE 4X4 12PLY STRL (GAUZE/BANDAGES/DRESSINGS) ×2 IMPLANT
GLOVE BIOGEL PI IND STRL 8 (GLOVE) ×2 IMPLANT
GLOVE BIOGEL PI INDICATOR 8 (GLOVE) ×2
GLOVE ECLIPSE 7.5 STRL STRAW (GLOVE) ×4 IMPLANT
GOWN STRL REUS W/ TWL LRG LVL3 (GOWN DISPOSABLE) IMPLANT
GOWN STRL REUS W/ TWL XL LVL3 (GOWN DISPOSABLE) ×4 IMPLANT
GOWN STRL REUS W/TWL 2XL LVL3 (GOWN DISPOSABLE) IMPLANT
GOWN STRL REUS W/TWL LRG LVL3 (GOWN DISPOSABLE)
GOWN STRL REUS W/TWL XL LVL3 (GOWN DISPOSABLE) ×4
KIT BASIN OR (CUSTOM PROCEDURE TRAY) ×2 IMPLANT
KIT INFUSE SMALL (Orthopedic Implant) ×2 IMPLANT
KIT TURNOVER KIT B (KITS) ×2 IMPLANT
NEEDLE ASP BONE MRW 8GX15 (NEEDLE) ×2 IMPLANT
NEEDLE SPNL 18GX3.5 QUINCKE PK (NEEDLE) ×2 IMPLANT
NEEDLE SPNL 22GX3.5 QUINCKE BK (NEEDLE) ×2 IMPLANT
NS IRRIG 1000ML POUR BTL (IV SOLUTION) ×4 IMPLANT
OIL CARTRIDGE MAESTRO DRILL (MISCELLANEOUS) ×2
PACK LAMINECTOMY NEURO (CUSTOM PROCEDURE TRAY) ×2 IMPLANT
PAD ARMBOARD 7.5X6 YLW CONV (MISCELLANEOUS) ×6 IMPLANT
ROD PREBENT 6.35X50 (Rod) ×2 IMPLANT
ROD PREBENT 6.35X60 (Rod) ×2 IMPLANT
SCREW MA TI 5.5X45 (Screw) ×4 IMPLANT
SCREW SET BREAK OFF (Screw) ×12 IMPLANT
SPONGE SURGIFOAM ABS GEL 100 (HEMOSTASIS) ×2 IMPLANT
STRIP BIOACTIVE VITOSS 25X100X (Neuro Prosthesis/Implant) ×4 IMPLANT
SUT VIC AB 1 CT1 18XBRD ANBCTR (SUTURE) ×2 IMPLANT
SUT VIC AB 1 CT1 8-18 (SUTURE) ×2
SUT VIC AB 2-0 CP2 18 (SUTURE) ×4 IMPLANT
SYR CONTROL 10ML LL (SYRINGE) ×2 IMPLANT
TAPE CLOTH SURG 4X10 WHT LF (GAUZE/BANDAGES/DRESSINGS) ×2 IMPLANT
TOWEL GREEN STERILE (TOWEL DISPOSABLE) ×2 IMPLANT
TOWEL GREEN STERILE FF (TOWEL DISPOSABLE) ×2 IMPLANT
TRAY FOLEY MTR SLVR 16FR STAT (SET/KITS/TRAYS/PACK) ×2 IMPLANT
WATER STERILE IRR 1000ML POUR (IV SOLUTION) ×2 IMPLANT

## 2019-06-29 NOTE — H&P (Signed)
Subjective: Patient is a 63 y.o. 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  Patient has had multiple previous surgeries with Dr. Leeroy Cha including 2 cervical spine surgeries in 3 lumbar surgeries.  The surgeries including an L5 laminectomy and L5-S1 PLIF with pedicle screw fixation in April 2001, and L4-5 PLIF and pedicle screw fixation with Medtronic screws in June 2011, and a right L3-4 lumbar laminotomy discectomy and January 2014.  Patient has been having increasing disabling pain across his low back, rating down to the right buttock and posterior thigh.  Is associated with numbness and tingling in the right buttock and posterior thigh.  It is aggravated by walking.  He is limited to walking a block or less.  It could also be aggravated by sitting, and he is particularly stiff when he gets up from a seated position.  MRI scan shows a large recurrent right L3-4 discitis with significant bilateral L3-4 facet arthropathy and particular encroachment of the right L3-4 neural foramen which corresponds to his right lumbar radiculopathy.  CT scan shows excellent fusion at L5-S1 with incomplete fusion at L4-5.  He has a good left L4-5 posterior lateral arthrodesis with incomplete interbody arthrodesis and nonunion of the right sided posterior lateral arthrodesis.  Instrumentation appears intact.  Patient is admitted now for L3-4 lumbar decompression including laminectomy, facetectomy, foraminotomies, and stabilization via a L3-4 posterior lumbar interbody arthrodesis with interbody implants and bone graft and L3-4 posterior lateral centesis with posterior instrumentation bone graft.  The posterior instrumentation will be tied into his existing Medtronic posterior instrumentation.  We will also revise the right L4-5 posterior lateral  arthrodesis.   Patient Active Problem List   Diagnosis Date Noted  . 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 Primary osteoarthritis, left shoulder   . 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  . History of colonic polyps 11/26/2017  . S/P total knee replacement, left 06/16/17 06/16/2017  . Primary osteoarthritis of left knee   . Hyperlipidemia 09/04/2016  . Angina pectoris ([place]) 09/04/2016  . OSA (obstructive sleep apnea) 09/04/2016  . COPD (chronic obstructive pulmonary disease) ([place]) 09/04/2016  . Rotator cuff tear 05/05/2014  . S/P shoulder surgery 09/26/2013  . Arthritis, shoulder region 09/26/2013  . Bursitis, shoulder 09/26/2013  . Synovitis of shoulder 09/26/2013  . Labral tear of shoulder, degenerative 09/26/2013  . Biceps tendon tear 09/26/2013  . Rotator cuff syndrome of left shoulder 06/21/2013  . Arthritis 06/21/2013  . Patellar tendinitis 03/29/2013  . Effusion of knee joint 03/29/2013  . Bursitis/tendonitis, shoulder 03/10/2013  . Effusion of knee joint, left 08/18/2011  . Knee pain 08/18/2011  . Acute torn meniscus 07/30/2011  . Old torn meniscus of knee 07/30/2011  . ARTHRITIS, LEFT KNEE 10/15/2010  . MEDIAL MENISCUS TEAR, RIGHT 10/15/2010  . HIP PAIN 01/15/2010  . DEGENERATIVE DISC DISEASE, LUMBOSACRAL SPINE W/RADICULOPATHY 01/15/2010  . PLICA SYNDROME 0000000  . DERANGEMENT MENISCUS 07/09/2009  . JOINT EFFUSION, LEFT KNEE 07/09/2009  . Unilateral primary osteoarthritis, left knee 01/30/2009  . KNEE PAIN 01/30/2009  . ANKLE SPRAIN, RIGHT 11/08/2007   Past Medical History:  Diagnosis Date  . Anxiety   . Arthritis   . Asthma   . BPH (benign prostatic hyperplasia)   . Complication of anesthesia    pt had a hard time being able to move after spinal anesthesia , 3-4 hours  . Depression   .  GERD (gastroesophageal reflux disease)   . Gout    no meds  . Headache(784.0)    otc meds prn  . Heart murmur    dx as a child, no problems as an adult  .  Hyperlipidemia   . IBS (irritable bowel syndrome)   . PONV (postoperative nausea and vomiting)   . Pre-diabetes    Borderline, diet and exercise, no med  . Sleep apnea    uses CIPAP machine at night  . Wears partial dentures    bottom partial    Past Surgical History:  Procedure Laterality Date  . APPENDECTOMY    . BACK SURGERY     neck and back fusion  . BIOPSY  12/27/2015   Procedure: BIOPSY;  Surgeon: Rogene Houston, MD;  [facility];  Service: Endoscopy;;  Fundus biopsies and duodenal biopsies  . CARDIAC CATHETERIZATION    . CARDIAC CATHETERIZATION N/A 09/04/2016   Procedure: Right/Left Heart Cath and Coronary Angiography;  Surgeon: Peter M Martinique, MD;  Location: [place] [facility];  Service: Cardiovascular;  Laterality: N/A;  . CHOLECYSTECTOMY    . CHONDROPLASTY  08/15/2011   Procedure: CHONDROPLASTY;  Surgeon: [name], MD;  Location: [facility];  Service: Orthopedics;  Laterality: Left;  . COLONOSCOPY  06/27/2011   Procedure: COLONOSCOPY;  Surgeon: Rogene Houston, MD;  [facility];  Service: Endoscopy;  Laterality: N/A;  9:00 / Pt to be here at 9am for 10:45 procedure, benign polyps removed  . COLONOSCOPY N/A 10/05/2014   Procedure: COLONOSCOPY;  Surgeon: Rogene Houston, MD;  [facility];  Service: Endoscopy;  Laterality: N/A;  930  . COLONOSCOPY N/A 02/11/2018   Procedure: COLONOSCOPY;  Surgeon: Rogene Houston, MD;  [facility];  Service: Endoscopy;  Laterality: N/A;  830  . ESOPHAGOGASTRODUODENOSCOPY N/A 12/27/2015   Procedure: ESOPHAGOGASTRODUODENOSCOPY (EGD);  Surgeon: Rogene Houston, MD;  [facility];  Service: Endoscopy;  Laterality: N/A;  3:00  . HERNIA REPAIR     umbilical hernia  . JOINT REPLACEMENT     knee and shoulder  . KNEE ARTHROSCOPY     left knee  . KNEE ARTHROSCOPY     right knee   . LUMBAR LAMINECTOMY/DECOMPRESSION MICRODISCECTOMY  09/14/2012   Procedure: LUMBAR LAMINECTOMY/DECOMPRESSION  MICRODISCECTOMY 1 LEVEL;  Surgeon: Floyce Stakes, MD;  Location: [place] [facility];  Service: Neurosurgery;  Laterality: Right;  Right Lumbar three-four Diskectomy  . neck fusion    . POLYPECTOMY  02/11/2018   Procedure: POLYPECTOMY;  Surgeon: Rogene Houston, MD;  [facility];  Service: Endoscopy;;  colon  . REVISION TOTAL SHOULDER TO REVERSE TOTAL SHOULDER Left 12/16/2018   Procedure: REVISION TOTAL SHOULDER TO REVERSE TOTAL SHOULDER;  Surgeon: Meredith Pel, MD;  Location: [place];  Service: Orthopedics;  Laterality: Left;  . SHOULDER ARTHROSCOPY WITH BICEPSTENOTOMY Left 09/23/2013   Procedure: SHOULDER ARTHROSCOPY WITH BICEPSTENOTOMY AND EXTENSIVE DEBRIDEMENT;  Surgeon: Carole Civil, MD;  Location: [facility];  Service: Orthopedics;  Laterality: Left;  . SHOULDER ARTHROSCOPY WITH ROTATOR CUFF REPAIR Left 05/05/2014   Procedure: SHOULDER ARTHROSCOPY LIMITED DEBRIDEMENT;  Surgeon: Carole Civil, MD;  Location[facility];  Service: Orthopedics;  Laterality: Left;  . SHOULDER OPEN ROTATOR CUFF REPAIR Left 05/05/2014   Procedure: ROTATOR CUFF REPAIR SHOULDER OPEN;  Surgeon: Carole Civil, MD;  Location[facility];  Service: Orthopedics;  Laterality: Left;  . SHOULDER OPEN ROTATOR CUFF REPAIR Left 12/16/2018  Procedure: LEFT SHOULDER POSSIBLE SUBSCAPULARIS REPAIR VS. REVISION TO REVERSE TOTAL SHOULDER REPLACEMENT;  Surgeon: Meredith Pel, MD;  Location: [place];  Service: Orthopedics;  Laterality: Left;  . SHOULDER SURGERY Right    Open Mumford procedure  . SPINAL FUSION     x 2  . TOTAL KNEE ARTHROPLASTY Left 06/16/2017   Procedure: LEFT TOTAL KNEE ARTHROPLASTY;  Surgeon: Carole Civil, MD;  Location: [facility];  Service: Orthopedics;  Laterality: Left;  . TOTAL SHOULDER ARTHROPLASTY Left 08/19/2018   Procedure: left shoulder replacement;  Surgeon: Meredith Pel, MD;  Location: [place];  Service: Orthopedics;  Laterality: Left;    Medications Prior to Admission   Medication Sig Dispense Refill Last Dose  . ALPRAZolam (XANAX) 0.25 MG tablet Take 0.25 mg by mouth 2 (two) times daily.    06/28/2019 at Unknown time  . dicyclomine (BENTYL) 10 MG capsule TAKE 1 CAPSULE THREE TIMES DAILY BEFORE MEALS (Patient taking differently: Take 10 mg by mouth every other day. ) 90 capsule 3 06/28/2019 at Unknown time  . Menthol, Topical Analgesic, (BIOFREEZE EX) Apply 1 application topically daily as needed (pain).   06/28/2019 at Unknown time  . methocarbamol (ROBAXIN) 500 MG tablet Take 1 tablet (500 mg total) by mouth every 6 (six) hours as needed for muscle spasms. (Patient taking differently: Take 500 mg by mouth every 12 (twelve) hours. ) 30 tablet 0 06/28/2019 at Unknown time  . Multiple Vitamin (MULTIVITAMIN WITH MINERALS) TABS tablet Take 1 tablet by mouth daily.   06/28/2019 at Unknown time  . nitroGLYCERIN (NITROSTAT) 0.4 MG SL tablet PLACE (1) TABLET UNDER TONGUE EVERY 5 MINUTES UP TO (3) DOSES. IF NO RELIEF CALL 911. (Patient taking differently: Place 0.4 mg under the tongue every 5 (five) minutes as needed for chest pain. ) 25 tablet 3   . pantoprazole (PROTONIX) 40 MG tablet Take 40 mg by mouth daily.     Marland Kitchen PROAIR HFA 108 (90 Base) MCG/ACT inhaler Inhale 2 puffs into the lungs every 6 (six) hours as needed for wheezing or shortness of breath.    06/28/2019 at Unknown time  . sertraline (ZOLOFT) 50 MG tablet Take 50 mg by mouth daily.    06/28/2019 at Unknown time  . temazepam (RESTORIL) 15 MG capsule Take 15 mg by mouth at bedtime.    06/28/2019 at Unknown time  . HYDROcodone-acetaminophen (NORCO) 10-325 MG tablet Take 1 tablet by mouth every 8 (eight) hours. (Patient not taking: Reported on 06/17/2019) 40 tablet 0 Not Taking at Unknown time  . HYDROcodone-acetaminophen (NORCO/VICODIN) 5-325 MG tablet Take 1 tablet by mouth every 8 (eight) hours as needed for moderate pain. (Patient not taking: Reported on 06/17/2019) 40 tablet 0 Not Taking at Unknown time    Allergies  Allergen Reactions  . Celebrex [Celecoxib] Itching, Swelling and Other (See Comments)    All over  . Cortisone Swelling    SWELLING REACTION UNSPECIFIED   . Doxycycline Swelling and Other (See Comments)    Made tongue turn black   . Prednisone Swelling    SWELLING REACTION UNSPECIFIED   . Relafen [Nabumetone] Swelling    SWELLING REACTION UNSPECIFIED   . Codeine Nausea And Vomiting and Other (See Comments)    Extreme stomach pain. This includes anything with the derivative of codeine in it.  . Oxycodone-Acetaminophen Itching and Other (See Comments)    Can not  tolerate with benadryl     Social History   Tobacco Use  . Smoking status:  Current Every Day Smoker    Packs/day: 1.00    Years: 44.00    Pack years: 44.00    Types: Cigarettes  . Smokeless tobacco: Never Used  . Tobacco comment: smokes a pack a day. since age 17  Substance Use Topics  . Alcohol use: No    Alcohol/week: 0.0 standard drinks    Family History  Problem Relation Age of Onset  . Diabetes Other   . Lung disease Other   . Arthritis Other   . Anesthesia problems Neg Hx   . Hypotension Neg Hx   . Malignant hyperthermia Neg Hx   . Pseudochol deficiency Neg Hx      Review of Systems Pertinent items noted in HPI and remainder of comprehensive ROS otherwise negative.  Objective: Vital signs in last 24 hours: Temp:  [98.7 F (37.1 C)] 98.7 F (37.1 C) (10/28 0604) Pulse Rate:  [52] 52 (10/28 0604) Resp:  [18] 18 (10/28 0604) BP: (134)/(61) 134/61 (10/28 0604) SpO2:  [96 %] 96 % (10/28 0604)  EXAM: Patient is a well-developed well-nourished white male in no acute distress.   Lungs are clear to auscultation , the patient has symmetrical respiratory excursion. Heart has a regular rate and rhythm normal S1 and S2 no murmur.   Abdomen is soft nontender nondistended bowel sounds are present. Extremity examination shows no clubbing cyanosis or edema. Motor examination shows 5 over 5 strength  in the lower extremities including the iliopsoas quadriceps dorsiflexor extensor hallicus  longus and plantar flexor bilaterally. Sensation is intact to pinprick in the distal lower extremities. Reflexes are symmetrical bilaterally. No pathologic reflexes are present. Patient has a normal gait and stance.  Data Review:CBC    Component Value Date/Time   WBC 9.5 06/20/2019 1139   RBC 4.93 06/20/2019 1139   HGB 14.1 06/20/2019 1139   HCT 42.5 06/20/2019 1139   PLT 239 06/20/2019 1139   MCV 86.2 06/20/2019 1139   MCH 28.6 06/20/2019 1139   MCHC 33.2 06/20/2019 1139   RDW 14.4 06/20/2019 1139   LYMPHSABS 2.5 06/11/2017 0824   MONOABS 0.7 06/11/2017 0824   EOSABS 0.3 06/11/2017 0824   BASOSABS 0.0 06/11/2017 0824                          BMET    Component Value Date/Time   NA 136 06/20/2019 1139   K 4.1 06/20/2019 1139   CL 103 06/20/2019 1139   CO2 26 06/20/2019 1139   GLUCOSE 133 (H) 06/20/2019 1139   BUN 12 06/20/2019 1139   CREATININE 0.86 06/20/2019 1139   CALCIUM 9.3 06/20/2019 1139   GFRNONAA >60 06/20/2019 1139   GFRAA >60 06/20/2019 1139     Assessment/Plan: Patient with low back and right lumbar radicular pain, secondary to degeneration and recurrent disc nation at L3-4, who is admitted for L3-4 lumbar decompression and stabilization.  We will also revise the L4-5 posterior lateral arthrodesis.  I've discussed with the patient the nature of his condition, the nature the surgical procedure, the typical length of surgery, hospital stay, and overall recuperation, the limitations postoperatively, and risks of surgery. I discussed risks including risks of infection, bleeding, possibly need for transfusion, the risk of nerve root dysfunction with pain, weakness, numbness, or paresthesias, the risk of dural tear and CSF leakage and possible need for further surgery, the risk of failure of the arthrodesis and possibly for further surgery, the risk of anesthetic complications  including  myocardial infarction, stroke, pneumonia, and death. We discussed the need for postoperative immobilization in a lumbar brace. Understanding all this the patient does wish to proceed with surgery and is admitted for such.   Hosie Spangle, MD 06/29/2019 7:01 AM

## 2019-06-29 NOTE — Op Note (Addendum)
06/29/2019  12:56 PM  PATIENT:  Christopher [name]  63 y.o. 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  PROCEDURE:  Procedure(s): Bilateral L3-4 lumbar decompression including laminectomy, facetectomy, foraminotomy, and microdiscectomy, with decompression beyond that required for interbody arthrodesis; bilateral L3-4 posterior lumbar interbody arthrodesis with Tritanium interbody implants, Vitoss BA with bone marrow aspirate, and infuse; bilateral L3-4 posterior lateral arthrodesis with Vitoss BA with bone marrow aspirate and infuse; exploration of right L4-5 posterior lateral arthrodesis and revision with Vitoss BA with bone marrow aspirate and infuse; L3-L5 segmental Medtronic Legacy posterior instrumentation  SURGEON: Jovita Gamma, MD  ASSISTANTS: Kary Kos, MD  ANESTHESIA:   general  EBL:  Total I/O In: 1600 [I.V.:1600] Out: 575 [Urine:275; Blood:300]  BLOOD ADMINISTERED:none  CELL SAVER GIVEN: Cell Saver technician felt that there was insufficient blood loss to process the collected blood.  COUNT: Correct per nursing staff  DICTATION: Patient is brought to the operating room placed under general endotracheal anesthesia. The patient was turned to prone position the lumbar region was prepped with Betadine soap and solution and draped in a sterile fashion.  The previous midline incision was marked.  The midline was infiltrated with local anesthesia with epinephrine. A localizing x-ray was taken and then a midline incision was made carried  down through the subcutaneous tissue, bipolar cautery and electrocautery were used to maintain hemostasis. Dissection was carried down to the lumbar fascia. The fascia was incised bilaterally and the paraspinal muscles were dissected with a spinous process and lamina in a subperiosteal fashion.  We identified the L2 and L3 spinous processes and lamina, and then identified the existing L4-5 posterior instrumentation.  Scar tissue was removed from around the posterior instrumentation.  The locking caps were unlocked and removed bilaterally, and then the rods were removed bilaterally.  We then cleared away scar tissue from around the screw heads to restore their mobility.  Another x-ray was taken to confirm localization and the L3-4 level was localized. Dissection was then carried out laterally over the facet complex and the transverse processes of L3 and L4 were exposed and decorticated.  We explored the right L4-5 posterior lateral arthrodesis.  There was incomplete bony union, and the bony surfaces were decorticated.    We then proceeded with the decompression at the L3-4 level.  A previous L4 laminectomy had been performed.  We exposed the lamina and spinous processes of L3, and identified the L3-4 facet joints bilaterally.  Using the high-speed drill and Kerrison punches bilateral L3 laminectomies and bilateral L3-4 facetectomies were performed.  There was significant scar tissue in the epidural space, worse on the right side where previous discectomy had been performed, at which time a CSF leak occurred.  We are able to identify the annulus in the lateral recess bilaterally.  Foraminotomies were performed for the exiting L3 and L4 nerve roots, decompressing the stenotic compression.  Once the decompression of the stenotic compression of the thecal sac and exiting nerve roots was completed we proceeded with the posterior lumbar interbody arthrodesis. The annulus was incised bilaterally and the disc space  entered. A thorough discectomy was performed using pituitary rongeurs and curettes. Once the discectomy was  completed we began to prepare the endplate surfaces removing the cartilaginous endplates surface. We then measured the height of the intervertebral disc space. We selected 10 x 23 x 6 x 9 Tritanium interbody implants.  The C-arm fluoroscope was then draped and brought in the field and we identified the pedicle entry points bilaterally at the L3 level.  We used the existing screws at L4 and L5.  Each of the pedicles was probed, we aspirated bone marrow aspirate from the vertebral bodies, this was injected over two 10 cc strips of Vitoss BA. Then each of the pedicles was examined with the ball probe good bony surfaces were found and no bony cuts were found. Each of the pedicles was then tapped with a 4.5 mm tap, again examined with the ball probe good threading was found and no bony cuts were found. We then placed 5.5 x 45 millimeter screws bilaterally at each level.  We then packed the interbody implants with Vitoss BA with bone marrow aspirate and infuse, and then placed the first implant on the right side, carefully retracting the thecal sac and nerve root medially. We then went back to the left side and packed the midline with additional Vitoss BA with bone marrow aspirate and infuse, and then placed a second implant on the left side, again retracting the thecal sac and nerve root medially. Additional Vitoss BA with bone marrow aspirate and infuse was packed lateral to the implants.  We then packed the lateral gutter over the transverse processes and intertransverse space with Vitoss BA with bone marrow aspirate.  From L3-L5 on the right side and from L3-L4 on the left side.  We then selected prelordosed rods.  We chose a 60 mm rod for the left side of the 50 mm rod for the right side.  They were placed within the screw heads and secured with locking caps once all 6 locking caps were placed final  tightening of the breakaway caps was performed against a counter torque.  The wound had been irrigated multiple times during the procedure with saline solution and bacitracin solution, good hemostasis was established with a combination of bipolar cautery and Gelfoam with thrombin.  The Gelfoam was removed, hemostasis confirmed, and we proceeded with closure.  The paraspinal muscles, deep fascia, and Scarpa's fascia were closed in separate layers with interrupted undyed 1 Vicryl sutures.  The subcutaneous and subcuticular closed with interrupted inverted 2-0 undyed Vicryl sutures the skin edges were approximated with Dermabond.  The wound was dressed with sterile gauze and Hypafix.  Following surgery the patient was turned back to the supine position to be reversed and the anesthetic extubated and transferred to the recovery room for further care.  PLAN OF CARE: Admit to inpatient   PATIENT DISPOSITION:  PACU - hemodynamically stable.   Delay start of Pharmacological VTE agent (>24hrs) due to surgical blood loss or risk of bleeding:  yes

## 2019-06-29 NOTE — Anesthesia Postprocedure Evaluation (Signed)
Anesthesia Post Note  Patient: Christopher [name]  Procedure(s) Performed: Lumbar Three-Four Decompression,posterior lumbar interbody fusion, posterior lateral arthrodesis, revision of Lumbar Four-Five posterior lateral arthrodesis (N/A Spine Lumbar)     Patient location during evaluation: PACU Anesthesia Type: General Level of consciousness: awake Pain management: pain level controlled Respiratory status: spontaneous breathing Cardiovascular status: stable Postop Assessment: no apparent nausea or vomiting Anesthetic complications: no    Last Vitals:  Vitals:   06/29/19 1450 06/29/19 1512  BP: 121/62 (!) 117/57  Pulse: (!) 53 (!) 53  Resp: 17 19  Temp: (!) 36.1 C 36.6 C  SpO2: 96%     Last Pain:  Vitals:   06/29/19 1512  TempSrc: Oral  PainSc:                  Hasson Gaspard

## 2019-06-29 NOTE — Anesthesia Procedure Notes (Signed)
Procedure Name: Intubation Date/Time: 06/29/2019 7:34 AM Performed by: Gisele Pack T, CRNA Pre-anesthesia Checklist: Patient identified, Emergency Drugs available, Suction available and Patient being monitored Patient Re-evaluated:Patient Re-evaluated prior to induction Preoxygenation: Pre-oxygenation with 100% oxygen Induction Type: IV induction and Rapid sequence Laryngoscope Size: Miller and 3 Grade View: Grade I Tube type: Oral Tube size: 7.5 mm Number of attempts: 1 Airway Equipment and Method: Patient positioned with wedge pillow and Stylet Placement Confirmation: ETT inserted through vocal cords under direct vision,  positive ETCO2 and breath sounds checked- equal and bilateral Secured at: 22 cm Tube secured with: Tape Dental Injury: Teeth and Oropharynx as per pre-operative assessment

## 2019-06-29 NOTE — Transfer of Care (Signed)
Immediate Anesthesia Transfer of Care Note  Patient: Christopher [name]  Procedure(s) Performed: Lumbar Three-Four Decompression,posterior lumbar interbody fusion, posterior lateral arthrodesis, revision of Lumbar Four-Five posterior lateral arthrodesis (N/A Spine Lumbar)  Patient Location: PACU  Anesthesia Type:General  Level of Consciousness: drowsy  Airway & Oxygen Therapy: Patient Spontanous Breathing and Patient connected to nasal cannula oxygen  Post-op Assessment: Report given to RN, Post -op Vital signs reviewed and stable and Patient moving all extremities  Post vital signs: Reviewed and stable  Last Vitals:  Vitals Value Taken Time  BP 135/60 06/29/19 1229  Temp    Pulse 61 06/29/19 1234  Resp 20 06/29/19 1234  SpO2 98 % 06/29/19 1234  Vitals shown include unvalidated device data.  Last Pain:  Vitals:   06/29/19 0612  TempSrc:   PainSc: 5       Patients Stated Pain Goal: 3 ([id number])  Complications: No apparent anesthesia complications

## 2019-06-29 NOTE — Progress Notes (Signed)
Vitals:   06/29/19 1435 06/29/19 1445 06/29/19 1450 06/29/19 1512  BP: 115/65  121/62 (!) 117/57  Pulse: (!) 54 (!) 55 (!) 53 (!) 53  Resp:  17 17 19   Temp:   (!) 97 F (36.1 C) 97.9 F (36.6 C)  TempSrc:    Oral  SpO2: 96% 96% 96%     Patient doing well.  Has ambulated in the halls.  Foley DC'd, nursing staff monitoring voiding function.  Dressing clean and dry.  Patient notes that right lumbar radicular pain is improved as compared to prior to surgery.  Plan: Doing well following surgery.  Encouraged to ambulate.  Continue to progress through postoperative recovery.  Hosie Spangle, MD 06/29/2019, 6:33 PM

## 2019-06-30 ENCOUNTER — Telehealth: Payer: Self-pay | Admitting: Orthopedic Surgery

## 2019-06-30 ENCOUNTER — Other Ambulatory Visit: Payer: Self-pay | Admitting: Surgical

## 2019-06-30 MED ORDER — HYDROCODONE-ACETAMINOPHEN 5-325 MG PO TABS: 1.0000 | ORAL_TABLET | ORAL | 0 refills | Status: DC | PRN

## 2019-06-30 MED ORDER — METHOCARBAMOL 500 MG PO TABS: 500.0000 mg | ORAL_TABLET | Freq: Two times a day (BID) | ORAL | 0 refills | Status: DC | PRN

## 2019-06-30 MED FILL — Sodium Chloride IV Soln 0.9%: INTRAVENOUS | Qty: 1000 | Status: AC

## 2019-06-30 MED FILL — Heparin Sodium (Porcine) Inj 1000 Unit/ML: INTRAMUSCULAR | Qty: 30 | Status: AC

## 2019-06-30 NOTE — Discharge Instructions (Signed)
°  Call Your Doctor If Any of These Occur °Redness, drainage, or swelling at the wound.  °Temperature greater than 101 degrees. °Severe pain not relieved by pain medication. °Incision starts to come apart. °Follow Up Appt °Call today for appointment in 3 weeks ([phone]) or for problems.  If you have any hardware placed in your spine, you will need an x-ray before your appointment. °

## 2019-06-30 NOTE — Telephone Encounter (Signed)
Pt wife called in requesting to refill robaxin for her husband and have that sent to [facility] in [place].   [phone]

## 2019-06-30 NOTE — Progress Notes (Signed)
Patient alert and oriented, mae's well, voiding adequate amount of urine, swallowing without difficulty, no c/o pain at time of discharge. Patient discharged home with family. Script and discharged instructions given to patient. Patient and family stated understanding of instructions given. Patient has an appointment with Dr. Nudelman 

## 2019-06-30 NOTE — Telephone Encounter (Signed)
Please advise. Thanks.  

## 2019-06-30 NOTE — Discharge Summary (Signed)
Physician Discharge Summary  Patient ID: Christopher [name] MRN: [id number] DOB/AGE: 09-Mar-1956 63 y.o.  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  Patient has done well following surgery with excellent relief of his right lumbar radicular pain.  He is up and ambulating actively.  He is voiding well.  His wound is healing nicely.  He is being discharged home with instructions regarding wound care and activities.  He is scheduled to follow-up with me in the office in 3 weeks.  Discharge Exam: Blood pressure (!) 102/54, pulse (!) 59, temperature 98.1 F (36.7 C), temperature source Oral, resp. rate 20, SpO2 95 %.  Disposition: Discharge disposition: 01-Home or Self Care       Discharge Instructions    Discharge wound care:   Complete by: As directed    Leave the wound open to air. Shower daily with the wound uncovered. Water and soapy water should run over the incision area. Do not wash directly on the incision for 2 weeks. Remove the glue after 2 weeks.   Driving Restrictions   Complete by: As directed    No driving for 2 weeks. May ride in the car locally now. May begin to drive locally in 2 weeks.   Other Restrictions   Complete by: As directed    Walk gradually increasing distances out  in the fresh air at least twice a day. Walking additional 6 times inside the house, gradually increasing distances, daily. No bending, lifting, or twisting. Perform activities between shoulder and waist height (that is at counter height when standing or table height when sitting).     Allergies as of 06/30/2019      Reactions   Celebrex [celecoxib] Itching, Swelling, Other (See Comments)   All over   Cortisone Swelling   SWELLING REACTION UNSPECIFIED    Doxycycline Swelling, Other (See Comments)   Made tongue turn black    Prednisone Swelling   SWELLING REACTION UNSPECIFIED    Relafen [nabumetone] Swelling   SWELLING REACTION UNSPECIFIED    Codeine Nausea And Vomiting, Other (See Comments)   Extreme stomach pain. This includes anything with the derivative of codeine in it.   Oxycodone-acetaminophen Itching, Other (See Comments)   Can not  tolerate with benadryl       Medication List    TAKE these medications   ALPRAZolam 0.25 MG tablet Commonly known as: XANAX Take 0.25 mg by mouth 2 (two) times daily.   BIOFREEZE EX Apply 1 application topically daily as needed (pain).   dicyclomine 10 MG capsule Commonly known as: BENTYL TAKE 1 CAPSULE THREE TIMES DAILY BEFORE MEALS What changed: See the new instructions.   HYDROcodone-acetaminophen 5-325 MG tablet Commonly known as: NORCO/VICODIN Take 1-2 tablets by mouth every 4 (four) hours as needed (pain). What changed:   how much to take  when to take this  reasons to take this  Another medication with the same name was removed. Continue taking  this medication, and follow the directions you see here.   methocarbamol 500 MG tablet Commonly known as: ROBAXIN Take 1 tablet (500 mg total) by mouth every 6 (six) hours as needed for muscle spasms. What changed: when to take this   multivitamin with minerals Tabs tablet Take 1 tablet by mouth daily.   nitroGLYCERIN 0.4 MG SL tablet Commonly known as: NITROSTAT PLACE (1)  TABLET UNDER TONGUE EVERY 5 MINUTES UP TO (3) DOSES. IF NO RELIEF CALL 911. What changed: See the new instructions.   pantoprazole 40 MG tablet Commonly known as: PROTONIX Take 40 mg by mouth daily.   ProAir HFA 108 (90 Base) MCG/ACT inhaler Generic drug: albuterol Inhale 2 puffs into the lungs every 6 (six) hours as needed for wheezing or shortness of breath.   sertraline 50 MG tablet Commonly known as: ZOLOFT Take 50 mg by mouth daily.   temazepam 15 MG capsule Commonly known as: RESTORIL Take 15 mg by mouth at bedtime.            Discharge Care Instructions  (From admission, onward)         Start     Ordered   06/30/19 0000  Discharge wound care:    Comments: Leave the wound open to air. Shower daily with the wound uncovered. Water and soapy water should run over the incision area. Do not wash directly on the incision for 2 weeks. Remove the glue after 2 weeks.   06/30/19 0737           Signed: Hosie Spangle 06/30/2019, 7:37 AM

## 2019-07-07 ENCOUNTER — Encounter (HOSPITAL_COMMUNITY): Payer: Self-pay | Admitting: Neurosurgery

## 2019-07-07 DIAGNOSIS — M545 Low back pain: Secondary | ICD-10-CM | POA: Diagnosis not present

## 2019-07-07 DIAGNOSIS — I10 Essential (primary) hypertension: Secondary | ICD-10-CM | POA: Diagnosis not present

## 2019-07-07 DIAGNOSIS — J441 Chronic obstructive pulmonary disease with (acute) exacerbation: Secondary | ICD-10-CM | POA: Diagnosis not present

## 2019-07-08 ENCOUNTER — Encounter (HOSPITAL_COMMUNITY): Payer: Self-pay | Admitting: Neurosurgery

## 2019-07-08 DIAGNOSIS — M5416 Radiculopathy, lumbar region: Secondary | ICD-10-CM | POA: Diagnosis not present

## 2019-07-20 ENCOUNTER — Ambulatory Visit: Payer: PPO | Admitting: Orthopedic Surgery

## 2019-07-22 DIAGNOSIS — M5126 Other intervertebral disc displacement, lumbar region: Secondary | ICD-10-CM | POA: Diagnosis not present

## 2019-08-04 DIAGNOSIS — G4733 Obstructive sleep apnea (adult) (pediatric): Secondary | ICD-10-CM | POA: Diagnosis not present

## 2019-09-19 ENCOUNTER — Ambulatory Visit: Payer: PPO | Admitting: Orthopedic Surgery

## 2019-09-23 DIAGNOSIS — Z981 Arthrodesis status: Secondary | ICD-10-CM | POA: Diagnosis not present

## 2019-10-03 ENCOUNTER — Other Ambulatory Visit: Payer: Self-pay

## 2019-10-03 ENCOUNTER — Ambulatory Visit (INDEPENDENT_AMBULATORY_CARE_PROVIDER_SITE_OTHER): Payer: PPO | Admitting: Orthopedic Surgery

## 2019-10-03 VITALS — BP 140/81 | HR 64 | Temp 97.1°F | Ht 66.0 in | Wt 237.0 lb

## 2019-10-03 DIAGNOSIS — M25511 Pain in right shoulder: Secondary | ICD-10-CM | POA: Diagnosis not present

## 2019-10-03 DIAGNOSIS — G8929 Other chronic pain: Secondary | ICD-10-CM

## 2019-10-03 NOTE — Progress Notes (Signed)
Chief Complaint  Patient presents with  . Follow-up    Recheck on right shoulder    64 year old male with arthritis right shoulder chronic rotator cuff disease presents for request of injection right shoulder  Has been having some pain as he has been doing some work on painting cars  He does have full forward elevation with mild to moderate discomfort seems to benefit well with injection   Procedure note the subacromial injection shoulder RIGHT    Verbal consent was obtained to inject the  RIGHT   Shoulder  Timeout was completed to confirm the injection site is a subacromial space of the  RIGHT  shoulder   Medication used Depo-Medrol 40 mg and lidocaine 1% 3 cc  Anesthesia was provided by ethyl chloride  The injection was performed in the RIGHT  posterior subacromial space. After pinning the skin with alcohol and anesthetized the skin with ethyl chloride the subacromial space was injected using a 20-gauge needle. There were no complications  Sterile dressing was applied.  Encounter Diagnosis  Name Primary?  . Chronic right shoulder pain Yes

## 2019-11-10 ENCOUNTER — Other Ambulatory Visit: Payer: Self-pay | Admitting: Internal Medicine

## 2019-12-07 DIAGNOSIS — I209 Angina pectoris, unspecified: Secondary | ICD-10-CM | POA: Diagnosis not present

## 2019-12-07 DIAGNOSIS — G4700 Insomnia, unspecified: Secondary | ICD-10-CM | POA: Diagnosis not present

## 2019-12-07 DIAGNOSIS — J449 Chronic obstructive pulmonary disease, unspecified: Secondary | ICD-10-CM | POA: Diagnosis not present

## 2019-12-07 DIAGNOSIS — H9202 Otalgia, left ear: Secondary | ICD-10-CM | POA: Diagnosis not present

## 2019-12-07 DIAGNOSIS — Z7689 Persons encountering health services in other specified circumstances: Secondary | ICD-10-CM | POA: Diagnosis not present

## 2019-12-14 ENCOUNTER — Ambulatory Visit: Payer: PPO | Admitting: Orthopedic Surgery

## 2019-12-22 DIAGNOSIS — H35363 Drusen (degenerative) of macula, bilateral: Secondary | ICD-10-CM | POA: Diagnosis not present

## 2019-12-22 DIAGNOSIS — H524 Presbyopia: Secondary | ICD-10-CM | POA: Diagnosis not present

## 2020-01-03 DIAGNOSIS — M7662 Achilles tendinitis, left leg: Secondary | ICD-10-CM | POA: Diagnosis not present

## 2020-02-02 ENCOUNTER — Other Ambulatory Visit (INDEPENDENT_AMBULATORY_CARE_PROVIDER_SITE_OTHER): Payer: Self-pay | Admitting: *Deleted

## 2020-02-02 ENCOUNTER — Other Ambulatory Visit: Payer: Self-pay

## 2020-02-02 ENCOUNTER — Ambulatory Visit (INDEPENDENT_AMBULATORY_CARE_PROVIDER_SITE_OTHER): Payer: PPO | Admitting: Gastroenterology

## 2020-02-02 ENCOUNTER — Encounter (INDEPENDENT_AMBULATORY_CARE_PROVIDER_SITE_OTHER): Payer: Self-pay | Admitting: Gastroenterology

## 2020-02-02 ENCOUNTER — Encounter (INDEPENDENT_AMBULATORY_CARE_PROVIDER_SITE_OTHER): Payer: Self-pay | Admitting: *Deleted

## 2020-02-02 VITALS — BP 130/61 | HR 55 | Temp 96.9°F | Ht 66.0 in | Wt 223.4 lb

## 2020-02-02 DIAGNOSIS — K921 Melena: Secondary | ICD-10-CM | POA: Diagnosis not present

## 2020-02-02 DIAGNOSIS — R1013 Epigastric pain: Secondary | ICD-10-CM | POA: Diagnosis not present

## 2020-02-02 MED ORDER — PANTOPRAZOLE SODIUM 40 MG PO TBEC: 40.0000 mg | DELAYED_RELEASE_TABLET | Freq: Two times a day (BID) | ORAL | 3 refills | Status: DC

## 2020-02-02 NOTE — Patient Instructions (Signed)
We are checking labs and checking your blood counts. Increase protonix to twice a day before meals. We are arranging upper endoscopy for evaluation

## 2020-02-02 NOTE — Progress Notes (Signed)
Patient profile: Christopher [name] is a 64 y.o. male seen for evaluation of diarrhea. Last seen in clinic 2019 for colonoscopy  History of Present Illness: Christopher [name] is seen today for evaluation of melena.  He reports symptoms began approximately 4 days ago.  He woke up with an aching stabbing in his upper abdominal area, it was followed by black stools that were loose diarrhea.  He took Pepto-Bismol for symptoms which denies any improvement and states he definitely had black stools prior to the Pepto.  He had some nausea with the symptoms that was not relieved by Phenergan but denies vomiting.  He denies any new medications or unusual foods prior to the onset of symptoms.  He does continue to have some aching diffusely over his abdomen as well as feels bloated.  He had the diarrhea for 2 days at onset of abd pain but has now had no bowel movement over the past 2 days.  He denies any bright red blood mixed in stool.  He tried Bentyl which did not help.  He is eating a smaller amount than normal.  He denies NSAID use.  He denies alcohol.  He does smoke 1 pack/day.  He wears a CPAP machine at night but denies recent changes to his settings. Denies hx of GI bleed in past.  Wife accompanies helps with history  Wt Readings from Last 3 Encounters:  02/02/20 223 lb 6.4 oz (101.3 kg)  10/03/19 237 lb (107.5 kg)  06/20/19 235 lb 11.2 oz (106.9 kg)     Last Colonoscopy:  2019- One 5 mm polyp at the hepatic flexure, removed with a cold snare. Complete resection. Partial retrieval. - Four small polyps in the sigmoid colon and at the splenic flexure, removed with a cold snare.  - External hemorrhoids. Comment: 9 polyps but all small. Colon, polyp(s), hepatic flexure, transverse, splenic flexure, sigmoid - TUBULAR ADENOMA (X6 FRAGMENTS). - NO HIGH GRADE DYSPLASIA OR MALIGNANCY.  Last Endoscopy: 2017--normal esophageal mucosa                           - Esophagogastric landmarks identified.                       - Acute gastritis involving fundal mucosa                            consistent with Mallory-Weiss tear but no history                            of vomiting or heaving. random biopsies taken from                            or spinal bulbar mucosa..                           - Normal duodenal bulb. random biopsies taken.                           - Non-bleeding duodenal diverticulum   Past Medical History:  Past Medical History:  Diagnosis Date  . Anxiety   . Arthritis   . Asthma   . BPH (benign prostatic hyperplasia)   . Complication of anesthesia  pt had a hard time being able to move after spinal anesthesia , 3-4 hours  . Depression   . GERD (gastroesophageal reflux disease)   . Gout    no meds  . Headache(784.0)    otc meds prn  . Heart murmur    dx as a child, no problems as an adult  . Hyperlipidemia   . IBS (irritable bowel syndrome)   . PONV (postoperative nausea and vomiting)   . Pre-diabetes    Borderline, diet and exercise, no med  . Sleep apnea    uses CIPAP machine at night  . Wears partial dentures    bottom partial    Problem List: Patient Active Problem List   Diagnosis Date Noted  . Lumbar disc herniation 06/29/2019  . 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 Primary osteoarthritis, left shoulder   . 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  . History of colonic polyps 11/26/2017  . S/P total knee replacement, left 06/16/17 06/16/2017  . Primary osteoarthritis of left knee   . Hyperlipidemia 09/04/2016  . Angina pectoris ([place]) 09/04/2016  . OSA (obstructive sleep apnea) 09/04/2016  . COPD (chronic obstructive pulmonary disease) ([place]) 09/04/2016  . Rotator cuff tear 05/05/2014  . S/P shoulder surgery 09/26/2013  . Arthritis, shoulder region 09/26/2013  . Bursitis, shoulder 09/26/2013  . Synovitis of shoulder 09/26/2013  . Labral tear of shoulder, degenerative 09/26/2013  . Biceps tendon tear 09/26/2013  . Rotator cuff  syndrome of left shoulder 06/21/2013  . Arthritis 06/21/2013  . Patellar tendinitis 03/29/2013  . Effusion of knee joint 03/29/2013  . Bursitis/tendonitis, shoulder 03/10/2013  . Effusion of knee joint, left 08/18/2011  . Knee pain 08/18/2011  . Acute torn meniscus 07/30/2011  . Old torn meniscus of knee 07/30/2011  . ARTHRITIS, LEFT KNEE 10/15/2010  . MEDIAL MENISCUS TEAR, RIGHT 10/15/2010  . HIP PAIN 01/15/2010  . DEGENERATIVE DISC DISEASE, LUMBOSACRAL SPINE W/RADICULOPATHY 01/15/2010  . PLICA SYNDROME 0000000  . DERANGEMENT MENISCUS 07/09/2009  . JOINT EFFUSION, LEFT KNEE 07/09/2009  . Unilateral primary osteoarthritis, left knee 01/30/2009  . KNEE PAIN 01/30/2009  . ANKLE SPRAIN, RIGHT 11/08/2007    Past Surgical History: Past Surgical History:  Procedure Laterality Date  . APPENDECTOMY    . BACK SURGERY     neck and back fusion  . BIOPSY  12/27/2015   Procedure: BIOPSY;  Surgeon: Rogene Houston, MD;  [facility];  Service: Endoscopy;;  Fundus biopsies and duodenal biopsies  . CARDIAC CATHETERIZATION    . CARDIAC CATHETERIZATION N/A 09/04/2016   Procedure: Right/Left Heart Cath and Coronary Angiography;  Surgeon: Peter M Martinique, MD;  Location: [facility];  Service: Cardiovascular;  Laterality: N/A;  . CHOLECYSTECTOMY    . CHONDROPLASTY  08/15/2011   Procedure: CHONDROPLASTY;  Surgeon: [name], MD;  Location: [facility];  Service: Orthopedics;  Laterality: Left;  . COLONOSCOPY  06/27/2011   Procedure: COLONOSCOPY;  Surgeon: Rogene Houston, MD;  [facility];  Service: Endoscopy;  Laterality: N/A;  9:00 / Pt to be here at 9am for 10:45 procedure, benign polyps removed  . COLONOSCOPY N/A 10/05/2014   Procedure: COLONOSCOPY;  Surgeon: Rogene Houston, MD;  [facility];  Service: Endoscopy;  Laterality: N/A;  930  . COLONOSCOPY N/A 02/11/2018   Procedure: COLONOSCOPY;  Surgeon: Rogene Houston, MD;  [facility];   Service: Endoscopy;  Laterality: N/A;  830  . ESOPHAGOGASTRODUODENOSCOPY N/A 12/27/2015   Procedure:  ESOPHAGOGASTRODUODENOSCOPY (EGD);  Surgeon: Rogene Houston, MD;  [facility];  Service: Endoscopy;  Laterality: N/A;  3:00  . HERNIA REPAIR     umbilical hernia  . JOINT REPLACEMENT     knee and shoulder  . KNEE ARTHROSCOPY     left knee  . KNEE ARTHROSCOPY     right knee   . LUMBAR LAMINECTOMY/DECOMPRESSION MICRODISCECTOMY  09/14/2012   Procedure: LUMBAR LAMINECTOMY/DECOMPRESSION MICRODISCECTOMY 1 LEVEL;  Surgeon: Floyce Stakes, MD;  Location: [facility];  Service: Neurosurgery;  Laterality: Right;  Right Lumbar three-four Diskectomy  . neck fusion    . POLYPECTOMY  02/11/2018   Procedure: POLYPECTOMY;  Surgeon: Rogene Houston, MD;  [facility];  Service: Endoscopy;;  colon  . REVISION TOTAL SHOULDER TO REVERSE TOTAL SHOULDER Left 12/16/2018   Procedure: REVISION TOTAL SHOULDER TO REVERSE TOTAL SHOULDER;  Surgeon: Meredith Pel, MD;  Location: [facility];  Service: Orthopedics;  Laterality: Left;  . SHOULDER ARTHROSCOPY WITH BICEPSTENOTOMY Left 09/23/2013   Procedure: SHOULDER ARTHROSCOPY WITH BICEPSTENOTOMY AND EXTENSIVE DEBRIDEMENT;  Surgeon: Carole Civil, MD;  Location[facility];  Service: Orthopedics;  Laterality: Left;  . SHOULDER ARTHROSCOPY WITH ROTATOR CUFF REPAIR Left 05/05/2014   Procedure: SHOULDER ARTHROSCOPY LIMITED DEBRIDEMENT;  Surgeon: Carole Civil, MD;  Location[facility];  Service: Orthopedics;  Laterality: Left;  . SHOULDER OPEN ROTATOR CUFF REPAIR Left 05/05/2014   Procedure: ROTATOR CUFF REPAIR SHOULDER OPEN;  Surgeon: Carole Civil, MD;  Location[facility];  Service: Orthopedics;  Laterality: Left;  . SHOULDER OPEN ROTATOR CUFF REPAIR Left 12/16/2018   Procedure: LEFT SHOULDER POSSIBLE SUBSCAPULARIS REPAIR VS. REVISION TO REVERSE TOTAL SHOULDER REPLACEMENT;  Surgeon: Meredith Pel, MD;  Location: [facility];  Service: Orthopedics;   Laterality: Left;  . SHOULDER SURGERY Right    Open Mumford procedure  . SPINAL FUSION     x 2  . TOTAL KNEE ARTHROPLASTY Left 06/16/2017   Procedure: LEFT TOTAL KNEE ARTHROPLASTY;  Surgeon: Carole Civil, MD;  [facility];  Service: Orthopedics;  Laterality: Left;  . TOTAL SHOULDER ARTHROPLASTY Left 08/19/2018   Procedure: left shoulder replacement;  Surgeon: Meredith Pel, MD;  Location: [facility];  Service: Orthopedics;  Laterality: Left;    Allergies: Allergies  Allergen Reactions  . Celebrex [Celecoxib] Itching, Swelling and Other (See Comments)    All over  . Cortisone Swelling    SWELLING REACTION UNSPECIFIED   . Doxycycline Swelling and Other (See Comments)    Made tongue turn black   . Prednisone Swelling    SWELLING REACTION UNSPECIFIED   . Relafen [Nabumetone] Swelling    SWELLING REACTION UNSPECIFIED   . Codeine Nausea And Vomiting and Other (See Comments)    Extreme stomach pain. This includes anything with the derivative of codeine in it.  . Oxycodone-Acetaminophen Itching and Other (See Comments)    Can not  tolerate with benadryl       Home Medications:  Current Outpatient Medications:  .  dicyclomine (BENTYL) 10 MG capsule, TAKE 1 CAPSULE THREE TIMES DAILY BEFORE MEALS (Patient taking differently: Take 10 mg by mouth every other day. ), Disp: 90 capsule, Rfl: 3 .  Menthol, Topical Analgesic, (ICY HOT EX), Apply topically. Per patient he will use as needed., Disp: , Rfl:  .  nitroGLYCERIN (NITROSTAT) 0.4 MG SL tablet, PLACE (1) TABLET UNDER TONGUE EVERY 5 MINUTES UP TO (3) DOSES. IF NO RELIEF CALL 911., Disp: 25 tablet, Rfl:  4 .  PROAIR HFA 108 (90 Base) MCG/ACT inhaler, Inhale 2 puffs into the lungs every 6 (six) hours as needed for wheezing or shortness of breath. , Disp: , Rfl:  .  sertraline (ZOLOFT) 50 MG tablet, Take 50 mg by mouth daily. , Disp: , Rfl:  .  temazepam (RESTORIL) 15 MG capsule, Take 15 mg by mouth at bedtime. , Disp: , Rfl:  .   ALPRAZolam (XANAX) 0.25 MG tablet, Take 0.25 mg by mouth 2 (two) times daily. , Disp: , Rfl:  .  HYDROcodone-acetaminophen (NORCO/VICODIN) 5-325 MG tablet, Take 1-2 tablets by mouth every 4 (four) hours as needed (pain). (Patient not taking: Reported on 10/03/2019), Disp: 40 tablet, Rfl: 0 .  pantoprazole (PROTONIX) 40 MG tablet, Take 1 tablet (40 mg total) by mouth 2 (two) times daily before a meal., Disp: 60 tablet, Rfl: 3   Family History: family history includes Arthritis in an other family member; Diabetes in an other family member; Lung disease in an other family member.    Social History:   reports that he has been smoking cigarettes. He has a 44.00 pack-year smoking history. He has never used smokeless tobacco. He reports that he does not drink alcohol or use drugs.   Review of Systems: Constitutional: Denies weight loss/weight gain  Eyes: No changes in vision. ENT: No oral lesions, sore throat.  GI: see HPI.  Heme/Lymph: No easy bruising.  CV: No chest pain.  GU: No hematuria.  Integumentary: No rashes.  Neuro: No headaches.  Psych: No depression/anxiety.  Endocrine: No heat/cold intolerance.  Allergic/Immunologic: No urticaria.  Resp: No cough, SOB.  Musculoskeletal: No joint swelling.    Physical Examination: BP 130/61 (BP Location: Right Arm, Patient Position: Sitting, Cuff Size: Large)   Pulse (!) 55   Temp (!) 96.9 F (36.1 C) (Temporal)   Ht 5\' 6"  (1.676 m)   Wt 223 lb 6.4 oz (101.3 kg)   BMI 36.06 kg/m  Gen: NAD, alert and oriented x 4 HEENT: PEERLA, EOMI, Neck: supple, no JVD Chest: CTA bilaterally, no wheezes, crackles, or other adventitious sounds CV: RRR, no m/g/c/r Abd: soft, minimal tenderness diffusely over abdomen, ND, +BS in all four quadrants; no HSM, guarding, ridigity, or rebound tenderness Brown mucus in rectal vault Hemoccult negative Ext: no edema, well perfused with 2+ pulses, Skin: no rash or lesions noted on observed skin Lymph: no noted  LAD  Data Reviewed:   06/2019-BMP with glucose 133, otherwise normal.  CBC with hemoglobin 14.1.  Assessment/Plan: Mr. Whitby is a 64 y.o. male seen today for reported black stool and abdominal pain, abdominal pain has persisted but black stool has resolved over the past 2 days.  Hemoccult negative on exam today-we will check basic labs CBC, CMP, lipase.  We will also schedule endoscopy for evaluation.  Patient denies any NSAID use.  We will have him increase his PPI from Protonix 40 mg once a day to Protonix 40 mg twice a day in the interim to see if will help discomfort.  Given diarrhea has resolved will hold off on stool studies but will consider if returns.  Patient denies CP, SOB, and use of blood thinners. I discussed the risks and benefits of procedure including bleeding, perforation, infection, missed lesions, medication reactions and possible hospitalization or surgery if complications. All questions answered.  Denies prior issues with sedation.  Wears CPAP  Further recommendations pending endoscopy results.   I personally performed the service, non-incident to. [facility])  Laurine Blazer,  PA-C [facility]

## 2020-02-03 ENCOUNTER — Other Ambulatory Visit (INDEPENDENT_AMBULATORY_CARE_PROVIDER_SITE_OTHER): Payer: Self-pay | Admitting: Gastroenterology

## 2020-02-03 ENCOUNTER — Telehealth (INDEPENDENT_AMBULATORY_CARE_PROVIDER_SITE_OTHER): Payer: Self-pay | Admitting: Gastroenterology

## 2020-02-03 DIAGNOSIS — K921 Melena: Secondary | ICD-10-CM

## 2020-02-03 DIAGNOSIS — R1013 Epigastric pain: Secondary | ICD-10-CM

## 2020-02-03 LAB — COMPLETE METABOLIC PANEL WITH GFR
AG Ratio: 2 (calc) (ref 1.0–2.5)
ALT: 22 U/L (ref 9–46)
AST: 14 U/L (ref 10–35)
Albumin: 4.4 g/dL (ref 3.6–5.1)
Alkaline phosphatase (APISO): 88 U/L (ref 35–144)
BUN: 16 mg/dL (ref 7–25)
CO2: 26 mmol/L (ref 20–32)
Calcium: 9.4 mg/dL (ref 8.6–10.3)
Chloride: 106 mmol/L (ref 98–110)
Creat: 0.93 mg/dL (ref 0.70–1.25)
GFR, Est African American: 102 mL/min/{1.73_m2} (ref >=60)
GFR, Est Non African American: 88 mL/min/{1.73_m2} (ref >=60)
Globulin: 2.2 g/dL (calc) (ref 1.9–3.7)
Glucose, Bld: 92 mg/dL (ref 65–139)
Potassium: 4.5 mmol/L (ref 3.5–5.3)
Sodium: 141 mmol/L (ref 135–146)
Total Bilirubin: 0.5 mg/dL (ref 0.2–1.2)
Total Protein: 6.6 g/dL (ref 6.1–8.1)

## 2020-02-03 LAB — CBC WITH DIFFERENTIAL/PLATELET
Absolute Monocytes: 542 cells/uL (ref 200–950)
Basophils Absolute: 29 cells/uL (ref 0–200)
Basophils Relative: 0.3 %
Eosinophils Absolute: 10 cells/uL — ABNORMAL LOW (ref 15–500)
Eosinophils Relative: 0.1 %
HCT: 43.6 % (ref 38.5–50.0)
Hemoglobin: 14.7 g/dL (ref 13.2–17.1)
Lymphs Abs: 2347 cells/uL (ref 850–3900)
MCH: 28.1 pg (ref 27.0–33.0)
MCHC: 33.7 g/dL (ref 32.0–36.0)
MCV: 83.4 fL (ref 80.0–100.0)
MPV: 10.1 fL (ref 7.5–12.5)
Monocytes Relative: 5.7 %
Neutro Abs: 6574 cells/uL (ref 1500–7800)
Neutrophils Relative %: 69.2 %
Platelets: 241 10*3/uL (ref 140–400)
RBC: 5.23 10*6/uL (ref 4.20–5.80)
RDW: 14.3 % (ref 11.0–15.0)
Total Lymphocyte: 24.7 %
WBC: 9.5 10*3/uL (ref 3.8–10.8)

## 2020-02-03 LAB — LIPASE: Lipase: 19 U/L (ref 7–60)

## 2020-02-03 NOTE — Telephone Encounter (Signed)
Christopher [name] - I discussed this case w/ Dr Laural Golden yesterday and given patient wears CPAP he prefers to do propofol. I have re-entered new set of orders. Please call him to reschedule-thank you.

## 2020-02-03 NOTE — Telephone Encounter (Signed)
I called and discussed w/ wife - she is aware you will call to change date, thanks Ann.

## 2020-02-03 NOTE — Telephone Encounter (Signed)
TCS changed to 02/17/20, patient's wife aware

## 2020-02-03 NOTE — Telephone Encounter (Signed)
Ok, will you please call patient and explain why we are changing sedation for procedure and then I'll call and resch'd. thanks

## 2020-02-06 ENCOUNTER — Ambulatory Visit: Payer: PPO | Admitting: Orthopedic Surgery

## 2020-02-06 ENCOUNTER — Encounter: Payer: Self-pay | Admitting: Orthopedic Surgery

## 2020-02-06 ENCOUNTER — Other Ambulatory Visit: Payer: Self-pay

## 2020-02-06 ENCOUNTER — Other Ambulatory Visit: Payer: Self-pay | Admitting: Orthopedic Surgery

## 2020-02-06 ENCOUNTER — Ambulatory Visit: Payer: PPO

## 2020-02-06 VITALS — BP 148/84 | HR 65 | Ht 66.0 in | Wt 225.0 lb

## 2020-02-06 DIAGNOSIS — M7711 Lateral epicondylitis, right elbow: Secondary | ICD-10-CM | POA: Diagnosis not present

## 2020-02-06 DIAGNOSIS — M25521 Pain in right elbow: Secondary | ICD-10-CM

## 2020-02-06 DIAGNOSIS — G8929 Other chronic pain: Secondary | ICD-10-CM | POA: Diagnosis not present

## 2020-02-06 NOTE — Progress Notes (Signed)
Chief Complaint  Patient presents with  . Elbow Pain    R/constantly aches worsen with movement pain for 1 month    64 year old male presents with 1 month history of pain in the right lateral elbow.  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  Complains of pain directly over the lateral epicondyles pain with wrist extension  Pain with power gripping  Review of Systems  Constitutional: Negative for fever.  Skin: Negative.   Neurological: Negative for tingling.   Past Medical History:  Diagnosis Date  . Anxiety   . Arthritis   . Asthma   . BPH (benign prostatic hyperplasia)   . Complication of anesthesia    pt had a hard time being able to move after spinal anesthesia , 3-4 hours  . Depression   . GERD (gastroesophageal reflux disease)   . Gout    no meds  . Headache(784.0)    otc meds prn  . Heart murmur    dx as a child, no problems as an adult  . Hyperlipidemia   . IBS (irritable bowel syndrome)   . PONV (postoperative nausea and vomiting)   . Pre-diabetes    Borderline, diet and exercise, no med  . Sleep apnea    uses CIPAP machine at night  . Wears partial dentures    bottom partial    BP (!) 148/84   Pulse 65   Ht 5\' 6"  (1.676 m)   Wt 225 lb (102.1 kg)   BMI 36.32 kg/m   Right elbow is tender in the region the lateral epicondyle and just distal to it he has pain with wrist extension with the elbow extended he has wrist extension resistance pain and long finger extension resistance pain  Range of motion is normal neurovascular exam is intact skin shows no lesions  No epitrochlear lymph node enlargement  X-ray showed a mild  Think we will go is you can get a shortened leg joint amount of joint space narrowing at the margin of the ulnohumeral joint  Encounter Diagnoses  Name Primary?  . Chronic elbow pain, right Yes  . Lateral epicondylitis, right elbow     Procedure note injection for right tennis elbow   Diagnosis right tennis  elbow  Anesthesia ethyl chloride was used Alcohol use is clean the skin  After we obtained verbal consent and timeout a 25-gauge needle was used to inject 40 mg of Depo-Medrol and 3 cc of 1% lidocaine just distal to the insertion of the ECRB  There were no complications and a sterile bandage was applied.   Recommend ibuprofen 803 times a day for 2 weeks Tennis elbow brace Exercises Injection

## 2020-02-08 NOTE — Patient Instructions (Signed)
KEVYN BOQUET  02/08/2020     @PREFPERIOPPHARMACY @   Your procedure is scheduled on  02/10/2020   Report to [facility] at  1150  A.M.  Call this number if you have problems the morning of surgery:  [phone]   Remember:  Follow the diet and prep instructions given to you by Dr Olevia Perches office.    Take these medicines the morning of surgery with A SIP OF WATER  Protonix, zoloft. Use your inhaler before you come.    Do not wear jewelry, make-up or nail polish.  Do not wear lotions, powders, or perfumes. Please wear deodorant and brush your teeth.  Do not shave 48 hours prior to surgery.  Men may shave face and neck.  Do not bring valuables to the hospital.  [facility] is not responsible for any belongings or valuables.  Contacts, dentures or bridgework may not be worn into surgery.  Leave your suitcase in the car.  After surgery it may be brought to your room.  For patients admitted to the hospital, discharge time will be determined by your treatment team.  Patients discharged the day of surgery will not be allowed to drive home.   Name and phone number of your driver:   family Special instructions:  DO NOT smoke the morning of your procedure.  Please read over the following fact sheets that you were given. Anesthesia Post-op Instructions and Care and Recovery After Surgery       Upper Endoscopy, Adult, Care After This sheet gives you information about how to care for yourself after your procedure. Your health care provider may also give you more specific instructions. If you have problems or questions, contact your health care provider. What can I expect after the procedure? After the procedure, it is common to have:  A sore throat.  Mild stomach pain or discomfort.  Bloating.  Nausea. Follow these instructions at home:   Follow instructions from your health care provider about what to eat or drink after your procedure.  Return to your normal  activities as told by your health care provider. Ask your health care provider what activities are safe for you.  Take over-the-counter and prescription medicines only as told by your health care provider.  Do not drive for 24 hours if you were given a sedative during your procedure.  Keep all follow-up visits as told by your health care provider. This is important. Contact a health care provider if you have:  A sore throat that lasts longer than one day.  Trouble swallowing. Get help right away if:  You vomit blood or your vomit looks like coffee grounds.  You have: ? A fever. ? Bloody, black, or tarry stools. ? A severe sore throat or you cannot swallow. ? Difficulty breathing. ? Severe pain in your chest or abdomen. Summary  After the procedure, it is common to have a sore throat, mild stomach discomfort, bloating, and nausea.  Do not drive for 24 hours if you were given a sedative during the procedure.  Follow instructions from your health care provider about what to eat or drink after your procedure.  Return to your normal activities as told by your health care provider. This information is not intended to replace advice given to you by your health care provider. Make sure you discuss any questions you have with your health care provider. Document Revised: 02/09/2018 Document Reviewed: 01/18/2018 Elsevier Patient Education  [place]  Anesthesia Care, Care After These instructions provide you with information about caring for yourself after your procedure. Your health care provider may also give you more specific instructions. Your treatment has been planned according to current medical practices, but problems sometimes occur. Call your health care provider if you have any problems or questions after your procedure. What can I expect after the procedure? After your procedure, you may:  Feel sleepy for several hours.  Feel clumsy and have poor balance  for several hours.  Feel forgetful about what happened after the procedure.  Have poor judgment for several hours.  Feel nauseous or vomit.  Have a sore throat if you had a breathing tube during the procedure. Follow these instructions at home: For at least 24 hours after the procedure:      Have a responsible adult stay with you. It is important to have someone help care for you until you are awake and alert.  Rest as needed.  Do not: ? Participate in activities in which you could fall or become injured. ? Drive. ? Use heavy machinery. ? Drink alcohol. ? Take sleeping pills or medicines that cause drowsiness. ? Make important decisions or sign legal documents. ? Take care of children on your own. Eating and drinking  Follow the diet that is recommended by your health care provider.  If you vomit, drink water, juice, or soup when you can drink without vomiting.  Make sure you have little or no nausea before eating solid foods. General instructions  Take over-the-counter and prescription medicines only as told by your health care provider.  If you have sleep apnea, surgery and certain medicines can increase your risk for breathing problems. Follow instructions from your health care provider about wearing your sleep device: ? Anytime you are sleeping, including during daytime naps. ? While taking prescription pain medicines, sleeping medicines, or medicines that make you drowsy.  If you smoke, do not smoke without supervision.  Keep all follow-up visits as told by your health care provider. This is important. Contact a health care provider if:  You keep feeling nauseous or you keep vomiting.  You feel light-headed.  You develop a rash.  You have a fever. Get help right away if:  You have trouble breathing. Summary  For several hours after your procedure, you may feel sleepy and have poor judgment.  Have a responsible adult stay with you for at least 24 hours  or until you are awake and alert. This information is not intended to replace advice given to you by your health care provider. Make sure you discuss any questions you have with your health care provider. Document Revised: 11/16/2017 Document Reviewed: 12/09/2015 Elsevier Patient Education  [place].

## 2020-02-09 ENCOUNTER — Encounter (HOSPITAL_COMMUNITY)
Admission: RE | Admit: 2020-02-09 | Discharge: 2020-02-09 | Disposition: A | Discharge status: Home / Self Care | Payer: PPO | Source: Ambulatory Visit | Attending: Internal Medicine | Admitting: Internal Medicine

## 2020-02-09 ENCOUNTER — Other Ambulatory Visit (HOSPITAL_COMMUNITY): Payer: PPO

## 2020-02-09 ENCOUNTER — Encounter (HOSPITAL_COMMUNITY): Payer: Self-pay

## 2020-02-09 ENCOUNTER — Other Ambulatory Visit (HOSPITAL_COMMUNITY)
Admission: RE | Admit: 2020-02-09 | Discharge: 2020-02-09 | Disposition: A | Discharge status: Home / Self Care | Payer: PPO | Source: Ambulatory Visit | Attending: Internal Medicine | Admitting: Internal Medicine

## 2020-02-09 ENCOUNTER — Other Ambulatory Visit: Payer: Self-pay

## 2020-02-09 DIAGNOSIS — Z01812 Encounter for preprocedural laboratory examination: Secondary | ICD-10-CM | POA: Diagnosis not present

## 2020-02-09 DIAGNOSIS — Z20822 Contact with and (suspected) exposure to covid-19: Secondary | ICD-10-CM | POA: Insufficient documentation

## 2020-02-09 DIAGNOSIS — K921 Melena: Secondary | ICD-10-CM | POA: Insufficient documentation

## 2020-02-09 DIAGNOSIS — R1013 Epigastric pain: Secondary | ICD-10-CM | POA: Diagnosis not present

## 2020-02-09 LAB — SARS CORONAVIRUS 2 (TAT 6-24 HRS): SARS Coronavirus 2: NEGATIVE

## 2020-02-10 ENCOUNTER — Encounter (HOSPITAL_COMMUNITY): Payer: Self-pay | Admitting: Internal Medicine

## 2020-02-10 ENCOUNTER — Ambulatory Visit (HOSPITAL_COMMUNITY): Payer: PPO | Admitting: Anesthesiology

## 2020-02-10 ENCOUNTER — Encounter (HOSPITAL_COMMUNITY)
Admission: RE | Disposition: A | Discharge status: Home / Self Care | Payer: Self-pay | Source: Home / Self Care | Attending: Internal Medicine

## 2020-02-10 ENCOUNTER — Ambulatory Visit (HOSPITAL_COMMUNITY)
Admission: RE | Admit: 2020-02-10 | Discharge: 2020-02-10 | Disposition: A | Discharge status: Home / Self Care | Payer: PPO | Attending: Internal Medicine | Admitting: Internal Medicine

## 2020-02-10 ENCOUNTER — Other Ambulatory Visit: Payer: Self-pay

## 2020-02-10 DIAGNOSIS — K921 Melena: Secondary | ICD-10-CM | POA: Diagnosis not present

## 2020-02-10 DIAGNOSIS — K219 Gastro-esophageal reflux disease without esophagitis: Secondary | ICD-10-CM | POA: Insufficient documentation

## 2020-02-10 DIAGNOSIS — F419 Anxiety disorder, unspecified: Secondary | ICD-10-CM | POA: Insufficient documentation

## 2020-02-10 DIAGNOSIS — E785 Hyperlipidemia, unspecified: Secondary | ICD-10-CM | POA: Diagnosis not present

## 2020-02-10 DIAGNOSIS — K319 Disease of stomach and duodenum, unspecified: Secondary | ICD-10-CM | POA: Diagnosis not present

## 2020-02-10 DIAGNOSIS — Z96612 Presence of left artificial shoulder joint: Secondary | ICD-10-CM | POA: Insufficient documentation

## 2020-02-10 DIAGNOSIS — Z96652 Presence of left artificial knee joint: Secondary | ICD-10-CM | POA: Insufficient documentation

## 2020-02-10 DIAGNOSIS — R1013 Epigastric pain: Secondary | ICD-10-CM | POA: Insufficient documentation

## 2020-02-10 DIAGNOSIS — R1033 Periumbilical pain: Secondary | ICD-10-CM | POA: Diagnosis not present

## 2020-02-10 DIAGNOSIS — F17210 Nicotine dependence, cigarettes, uncomplicated: Secondary | ICD-10-CM | POA: Diagnosis not present

## 2020-02-10 DIAGNOSIS — M1990 Unspecified osteoarthritis, unspecified site: Secondary | ICD-10-CM | POA: Insufficient documentation

## 2020-02-10 DIAGNOSIS — K449 Diaphragmatic hernia without obstruction or gangrene: Secondary | ICD-10-CM | POA: Insufficient documentation

## 2020-02-10 DIAGNOSIS — K589 Irritable bowel syndrome without diarrhea: Secondary | ICD-10-CM | POA: Diagnosis not present

## 2020-02-10 DIAGNOSIS — K228 Other specified diseases of esophagus: Secondary | ICD-10-CM

## 2020-02-10 DIAGNOSIS — K3189 Other diseases of stomach and duodenum: Secondary | ICD-10-CM | POA: Insufficient documentation

## 2020-02-10 DIAGNOSIS — G4730 Sleep apnea, unspecified: Secondary | ICD-10-CM | POA: Insufficient documentation

## 2020-02-10 DIAGNOSIS — J449 Chronic obstructive pulmonary disease, unspecified: Secondary | ICD-10-CM | POA: Diagnosis not present

## 2020-02-10 DIAGNOSIS — Z79899 Other long term (current) drug therapy: Secondary | ICD-10-CM | POA: Diagnosis not present

## 2020-02-10 DIAGNOSIS — F329 Major depressive disorder, single episode, unspecified: Secondary | ICD-10-CM | POA: Diagnosis not present

## 2020-02-10 DIAGNOSIS — K2970 Gastritis, unspecified, without bleeding: Secondary | ICD-10-CM | POA: Diagnosis not present

## 2020-02-10 DIAGNOSIS — F418 Other specified anxiety disorders: Secondary | ICD-10-CM | POA: Diagnosis not present

## 2020-02-10 HISTORY — PX: ESOPHAGOGASTRODUODENOSCOPY (EGD) WITH PROPOFOL: SHX5813

## 2020-02-10 HISTORY — PX: BIOPSY: SHX5522

## 2020-02-10 SURGERY — ESOPHAGOGASTRODUODENOSCOPY (EGD) WITH PROPOFOL
Anesthesia: General

## 2020-02-10 MED ORDER — PROPOFOL 10 MG/ML IV BOLUS
INTRAVENOUS | Status: AC
  Filled 2020-02-10: qty 40

## 2020-02-10 MED ORDER — LACTATED RINGERS IV SOLN: INTRAVENOUS | Status: DC | PRN

## 2020-02-10 MED ORDER — GLYCOPYRROLATE 0.2 MG/ML IJ SOLN
0.2000 mg | Freq: Once | INTRAMUSCULAR | Status: AC
  Administered 2020-02-10: 0.2 mg via INTRAVENOUS
  Filled 2020-02-10: qty 1

## 2020-02-10 MED ORDER — LIDOCAINE HCL (CARDIAC) PF 100 MG/5ML IV SOSY
PREFILLED_SYRINGE | INTRAVENOUS | Status: DC | PRN
  Administered 2020-02-10: 60 mg via INTRAVENOUS

## 2020-02-10 MED ORDER — SODIUM CHLORIDE 0.9 % IV SOLN: INTRAVENOUS | Status: DC

## 2020-02-10 MED ORDER — LACTATED RINGERS IV SOLN: Freq: Once | INTRAVENOUS | Status: AC

## 2020-02-10 MED ORDER — LIDOCAINE VISCOUS HCL 2 % MT SOLN
OROMUCOSAL | Status: AC
  Filled 2020-02-10: qty 15

## 2020-02-10 MED ORDER — LIDOCAINE 2% (20 MG/ML) 5 ML SYRINGE
INTRAMUSCULAR | Status: AC
  Filled 2020-02-10: qty 5

## 2020-02-10 MED ORDER — KETAMINE HCL 10 MG/ML IJ SOLN
INTRAMUSCULAR | Status: DC | PRN
  Administered 2020-02-10: 30 mg via INTRAVENOUS

## 2020-02-10 MED ORDER — LIDOCAINE VISCOUS HCL 2 % MT SOLN
15.0000 mL | Freq: Once | OROMUCOSAL | Status: AC
  Administered 2020-02-10: 15 mL via OROMUCOSAL

## 2020-02-10 MED ORDER — KETAMINE HCL 50 MG/5ML IJ SOSY
PREFILLED_SYRINGE | INTRAMUSCULAR | Status: AC
  Filled 2020-02-10: qty 5

## 2020-02-10 MED ORDER — STERILE WATER FOR IRRIGATION IR SOLN
Status: DC | PRN
  Administered 2020-02-10: 2.5 mL

## 2020-02-10 MED ORDER — CHLORHEXIDINE GLUCONATE CLOTH 2 % EX PADS: 6.0000 | MEDICATED_PAD | Freq: Once | CUTANEOUS | Status: DC

## 2020-02-10 MED ORDER — PROPOFOL 500 MG/50ML IV EMUL
INTRAVENOUS | Status: DC | PRN
  Administered 2020-02-10: 150 ug/kg/min via INTRAVENOUS

## 2020-02-10 NOTE — Op Note (Signed)
[facility] Patient Name: Christopher [name] Procedure Date: 02/10/2020 1:10 PM MRN: [id number] Date of Birth: 04-Jun-1956 Attending MD: Hildred Laser , MD CSN: [id number] Age: 64 Admit Type: Outpatient Procedure:                Upper GI endoscopy Indications:              Epigastric abdominal pain, Periumbilical abdominal                            pain, Melena Providers:                Hildred Laser, MD, Lurline Del, RN, Randa Spike,                            Technician Referring MD:              Medicines:                Propofol per Anesthesia Complications:            No immediate complications. Estimated Blood Loss:     Estimated blood loss was minimal. Procedure:                Pre-Anesthesia Assessment:                           - Prior to the procedure, a History and Physical                            was performed, and patient medications and                            allergies were reviewed. The patient's tolerance of                            previous anesthesia was also reviewed. The risks                            and benefits of the procedure and the sedation                            options and risks were discussed with the patient.                            All questions were answered, and informed consent                            was obtained. Prior Anticoagulants: The patient has                            taken no previous anticoagulant or antiplatelet                            agents except for NSAID medication. ASA Grade                            Assessment: III -  A patient with severe systemic                            disease. After reviewing the risks and benefits,                            the patient was deemed in satisfactory condition to                            undergo the procedure.                           After obtaining informed consent, the endoscope was                            passed under direct vision. Throughout the                             procedure, the patient's blood pressure, pulse, and                            oxygen saturations were monitored continuously. The                            [id number]-H190 ([id number]) scope was introduced through the                            mouth, and advanced to the second part of duodenum.                            The upper GI endoscopy was accomplished without                            difficulty. The patient tolerated the procedure                            well. Scope In: 1:26:51 PM Scope Out: 1:33:37 PM Total Procedure Duration: 0 hours 6 minutes 46 seconds  Findings:      The hypopharynx was normal.      The examined esophagus was normal.      The Z-line was irregular and was found 40 cm from the incisors.      A 2 cm hiatal hernia was present.      Patchy mild inflammation characterized by congestion (edema) and       erythema with nodularity was found in the gastric antrum and in the       prepyloric region of the stomach. This was biopsied with a cold forceps       for histology.      The exam of the stomach was otherwise normal.      The duodenal bulb and second portion of the duodenum were normal. Impression:               - Normal hypopharynx.                           -  Normal esophagus.                           - Z-line irregular, 40 cm from the incisors.                           - 2 cm hiatal hernia.                           - Gastritis. Biopsied.                           - Normal duodenal bulb and second portion of the                            duodenum. Moderate Sedation:      Per Anesthesia Care Recommendation:           - Patient has a contact number available for                            emergencies. The signs and symptoms of potential                            delayed complications were discussed with the                            patient. Return to normal activities tomorrow.                            Written discharge instructions  were provided to the                            patient.                           - Resume previous diet today.                           - Continue present medications.                           - No aspirin, ibuprofen, naproxen, or other                            non-steroidal anti-inflammatory drugs.                           - Await pathology results. Procedure Code(s):        --- Professional ---                           [phone], Esophagogastroduodenoscopy, flexible,                            transoral; with biopsy, single or multiple Diagnosis Code(s):        --- Professional ---  K22.8, Other specified diseases of esophagus                           K44.9, Diaphragmatic hernia without obstruction or                            gangrene                           K29.70, Gastritis, unspecified, without bleeding                           R10.13, Epigastric pain                           C46.19, Periumbilical pain                           K92.1, Melena (includes Hematochezia) CPT copyright 2019 American Medical Association. All rights reserved. The codes documented in this report are preliminary and upon coder review may  be revised to meet current compliance requirements. Hildred Laser, MD Hildred Laser, MD 02/10/2020 1:43:50 PM This report has been signed electronically. Number of Addenda: 0

## 2020-02-10 NOTE — Anesthesia Postprocedure Evaluation (Signed)
Anesthesia Post Note  Patient: Christopher [name]  Procedure(s) Performed: ESOPHAGOGASTRODUODENOSCOPY (EGD) WITH PROPOFOL (N/A ) BIOPSY  Patient location during evaluation: PACU Anesthesia Type: General Level of consciousness: awake and alert, awake and patient cooperative Pain management: pain level controlled Vital Signs Assessment: post-procedure vital signs reviewed and stable Respiratory status: spontaneous breathing, respiratory function stable and nonlabored ventilation Cardiovascular status: stable Postop Assessment: no apparent nausea or vomiting Anesthetic complications: no   No complications documented.   Last Vitals:  Vitals:   02/10/20 1154  BP: 123/69  Pulse: (!) 57  Resp: 20  Temp: 36.5 C  SpO2: 95%    Last Pain:  Vitals:   02/10/20 1154  TempSrc: Oral  PainSc: 7                  Rebie Peale

## 2020-02-10 NOTE — Discharge Instructions (Signed)
No aspirin or NSAIDs. Resume other medications as before. Consider taking dicyclomine 10 mg daily before breakfast as long as you do not have side effects. Resume usual diet. No driving for 24 hours. Physician will call with biopsy results and further recommendations.      Upper Endoscopy, Adult, Care After This sheet gives you information about how to care for yourself after your procedure. Your health care provider may also give you more specific instructions. If you have problems or questions, contact your health care provider. What can I expect after the procedure? After the procedure, it is common to have:  A sore throat.  Mild stomach pain or discomfort.  Bloating.  Nausea. Follow these instructions at home:   Follow instructions from your health care provider about what to eat or drink after your procedure.  Return to your normal activities as told by your health care provider. Ask your health care provider what activities are safe for you.  Take over-the-counter and prescription medicines only as told by your health care provider.  Do not drive for 24 hours if you were given a sedative during your procedure.  Keep all follow-up visits as told by your health care provider. This is important. Contact a health care provider if you have:  A sore throat that lasts longer than one day.  Trouble swallowing. Get help right away if:  You vomit blood or your vomit looks like coffee grounds.  You have: ? A fever. ? Bloody, black, or tarry stools. ? A severe sore throat or you cannot swallow. ? Difficulty breathing. ? Severe pain in your chest or abdomen. Summary  After the procedure, it is common to have a sore throat, mild stomach discomfort, bloating, and nausea.  Do not drive for 24 hours if you were given a sedative during the procedure.  Follow instructions from your health care provider about what to eat or drink after your procedure.  Return to your  normal activities as told by your health care provider. This information is not intended to replace advice given to you by your health care provider. Make sure you discuss any questions you have with your health care provider. Document Revised: 02/09/2018 Document Reviewed: 01/18/2018 Elsevier Patient Education  2020 [place] After These instructions provide you with information about caring for yourself after your procedure. Your health care provider may also give you more specific instructions. Your treatment has been planned according to current medical practices, but problems sometimes occur. Call your health care provider if you have any problems or questions after your procedure. What can I expect after the procedure? After your procedure, you may:  Feel sleepy for several hours.  Feel clumsy and have poor balance for several hours.  Feel forgetful about what happened after the procedure.  Have poor judgment for several hours.  Feel nauseous or vomit.  Have a sore throat if you had a breathing tube during the procedure. Follow these instructions at home: For at least 24 hours after the procedure:      Have a responsible adult stay with you. It is important to have someone help care for you until you are awake and alert.  Rest as needed.  Do not: ? Participate in activities in which you could fall or become injured. ? Drive. ? Use heavy machinery. ? Drink alcohol. ? Take sleeping pills or medicines that cause drowsiness. ? Make important decisions or sign legal documents. ? Take  care of children on your own. Eating and drinking  Follow the diet that is recommended by your health care provider.  If you vomit, drink water, juice, or soup when you can drink without vomiting.  Make sure you have little or no nausea before eating solid foods. General instructions  Take over-the-counter and prescription medicines only as told  by your health care provider.  If you have sleep apnea, surgery and certain medicines can increase your risk for breathing problems. Follow instructions from your health care provider about wearing your sleep device: ? Anytime you are sleeping, including during daytime naps. ? While taking prescription pain medicines, sleeping medicines, or medicines that make you drowsy.  If you smoke, do not smoke without supervision.  Keep all follow-up visits as told by your health care provider. This is important. Contact a health care provider if:  You keep feeling nauseous or you keep vomiting.  You feel light-headed.  You develop a rash.  You have a fever. Get help right away if:  You have trouble breathing. Summary  For several hours after your procedure, you may feel sleepy and have poor judgment.  Have a responsible adult stay with you for at least 24 hours or until you are awake and alert. This information is not intended to replace advice given to you by your health care provider. Make sure you discuss any questions you have with your health care provider. Document Revised: 11/16/2017 Document Reviewed: 12/09/2015 Elsevier Patient Education  [place].

## 2020-02-10 NOTE — H&P (Signed)
Christopher [name] is an 64 y.o. male.   Chief Complaint: Patient is here for esophagogastroduodenoscopy. HPI: Patient is 64 year old Caucasian male who has chronic GERD maintained on PPI who presents with few weeks history of postprandial epigastric and periumbilical pain associated with nausea but no vomiting.  He also states he has had 4 episodes of melena.  He was seen in the office last week and his stool was guaiac negative.  He had CBC comprehensive chemistry panel and serum lipase on 02/02/2020 and these were all normal.  He denies rectal bleeding.  He smokes about a pack a day but does not drink alcohol.  When he was seen in the office he indicated he was not taking any NSAIDs.  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  Also having intermittent diarrhea.  He underwent EGD back in April 2017 for epigastric pain and he was noted to have gastritis.  Gastric biopsy was negative for H. pylori and duodenal biopsy was negative for celiac disease.  Past Medical History:  Diagnosis Date  . Anxiety   . Arthritis   . Asthma   . BPH (benign prostatic hyperplasia)   . Complication of anesthesia    pt had a hard time being able to move after spinal anesthesia , 3-4 hours  . Depression   . GERD (gastroesophageal reflux disease)   . Gout    no meds  . Headache(784.0)    otc meds prn  . Heart murmur    dx as a child, no problems as an adult  . Hyperlipidemia   . IBS (irritable bowel syndrome)   . PONV (postoperative nausea and vomiting)   . Pre-diabetes    Borderline, diet and exercise, no med  . Sleep apnea    uses CIPAP machine at night  . Wears partial dentures    bottom partial    Past Surgical History:  Procedure Laterality Date  . APPENDECTOMY    . BACK SURGERY     neck and back fusion  . BIOPSY  12/27/2015   Procedure: BIOPSY;  Surgeon: Rogene Houston, MD;  [facility];  Service: Endoscopy;;  Fundus biopsies and duodenal biopsies  .  CARDIAC CATHETERIZATION    . CARDIAC CATHETERIZATION N/A 09/04/2016   Procedure: Right/Left Heart Cath and Coronary Angiography;  Surgeon: Peter M Martinique, MD;  Location: [facility];  Service: Cardiovascular;  Laterality: N/A;  . CHOLECYSTECTOMY    . CHONDROPLASTY  08/15/2011   Procedure: CHONDROPLASTY;  Surgeon: [name], MD;  Location: [facility];  Service: Orthopedics;  Laterality: Left;  . COLONOSCOPY  06/27/2011   Procedure: COLONOSCOPY;  Surgeon: Rogene Houston, MD;  [facility];  Service: Endoscopy;  Laterality: N/A;  9:00 / Pt to be here at 9am for 10:45 procedure, benign polyps removed  . COLONOSCOPY N/A 10/05/2014   Procedure: COLONOSCOPY;  Surgeon: Rogene Houston, MD;  [facility];  Service: Endoscopy;  Laterality: N/A;  930  . COLONOSCOPY N/A 02/11/2018   Procedure: COLONOSCOPY;  Surgeon: Rogene Houston, MD;  [facility];  Service: Endoscopy;  Laterality: N/A;  830  . ESOPHAGOGASTRODUODENOSCOPY N/A 12/27/2015   Procedure: ESOPHAGOGASTRODUODENOSCOPY (EGD);  Surgeon: Rogene Houston, MD;  [facility];  Service: Endoscopy;  Laterality: N/A;  3:00  . HERNIA REPAIR     umbilical hernia  . JOINT REPLACEMENT     knee and shoulder  .  KNEE ARTHROSCOPY     left knee  . KNEE ARTHROSCOPY     right knee   . LUMBAR LAMINECTOMY/DECOMPRESSION MICRODISCECTOMY  09/14/2012   Procedure: LUMBAR LAMINECTOMY/DECOMPRESSION MICRODISCECTOMY 1 LEVEL;  Surgeon: Floyce Stakes, MD;  Location: [facility];  Service: Neurosurgery;  Laterality: Right;  Right Lumbar three-four Diskectomy  . neck fusion    . POLYPECTOMY  02/11/2018   Procedure: POLYPECTOMY;  Surgeon: Rogene Houston, MD;  [facility];  Service: Endoscopy;;  colon  . REVISION TOTAL SHOULDER TO REVERSE TOTAL SHOULDER Left 12/16/2018   Procedure: REVISION TOTAL SHOULDER TO REVERSE TOTAL SHOULDER;  Surgeon: Meredith Pel, MD;  Location: [facility];  Service: Orthopedics;   Laterality: Left;  . SHOULDER ARTHROSCOPY WITH BICEPSTENOTOMY Left 09/23/2013   Procedure: SHOULDER ARTHROSCOPY WITH BICEPSTENOTOMY AND EXTENSIVE DEBRIDEMENT;  Surgeon: Carole Civil, MD;  Location[facility];  Service: Orthopedics;  Laterality: Left;  . SHOULDER ARTHROSCOPY WITH ROTATOR CUFF REPAIR Left 05/05/2014   Procedure: SHOULDER ARTHROSCOPY LIMITED DEBRIDEMENT;  Surgeon: Carole Civil, MD;  Location: [facility];  Service: Orthopedics;  Laterality: Left;  . SHOULDER OPEN ROTATOR CUFF REPAIR Left 05/05/2014   Procedure: ROTATOR CUFF REPAIR SHOULDER OPEN;  Surgeon: Carole Civil, MD;  Location: [facility];  Service: Orthopedics;  Laterality: Left;  . SHOULDER OPEN ROTATOR CUFF REPAIR Left 12/16/2018   Procedure: LEFT SHOULDER POSSIBLE SUBSCAPULARIS REPAIR VS. REVISION TO REVERSE TOTAL SHOULDER REPLACEMENT;  Surgeon: Meredith Pel, MD;  Location: [facility];  Service: Orthopedics;  Laterality: Left;  . SHOULDER SURGERY Right    Open Mumford procedure  . SPINAL FUSION     x 2  . TOTAL KNEE ARTHROPLASTY Left 06/16/2017   Procedure: LEFT TOTAL KNEE ARTHROPLASTY;  Surgeon: Carole Civil, MD;  Location: [facility];  Service: Orthopedics;  Laterality: Left;  . TOTAL SHOULDER ARTHROPLASTY Left 08/19/2018   Procedure: left shoulder replacement;  Surgeon: Meredith Pel, MD;  Location: [facility];  Service: Orthopedics;  Laterality: Left;    Family History  Problem Relation Age of Onset  . Diabetes Other   . Lung disease Other   . Arthritis Other   . Anesthesia problems Neg Hx   . Hypotension Neg Hx   . Malignant hyperthermia Neg Hx   . Pseudochol deficiency Neg Hx    Social History:  reports that he has been smoking cigarettes. He has a 44.00 pack-year smoking history. He has never used smokeless tobacco. He reports that he does not drink alcohol and does not use drugs.  Allergies:  Allergies  Allergen Reactions  . Celebrex [Celecoxib] Itching, Swelling and Other (See Comments)    All  over  . Cortisone Swelling    SWELLING REACTION UNSPECIFIED   . Doxycycline Swelling and Other (See Comments)    Made tongue turn black   . Prednisone Swelling    SWELLING REACTION UNSPECIFIED   . Relafen [Nabumetone] Swelling    SWELLING REACTION UNSPECIFIED   . Codeine Nausea And Vomiting and Other (See Comments)    Extreme stomach pain. This includes anything with the derivative of codeine in it.  . Oxycodone-Acetaminophen Itching and Other (See Comments)    Can not  tolerate with benadryl     Medications Prior to Admission  Medication Sig Dispense Refill  . dicyclomine (BENTYL) 10 MG capsule TAKE 1 CAPSULE THREE TIMES DAILY BEFORE MEALS (Patient taking differently: Take 10 mg by mouth every other day. ) 90 capsule 3  . Menthol, Topical  Analgesic, (ICY HOT EX) Apply 1 application topically daily as needed (pain). Per patient he will use as needed.     . nitroGLYCERIN (NITROSTAT) 0.4 MG SL tablet PLACE (1) TABLET UNDER TONGUE EVERY 5 MINUTES UP TO (3) DOSES. IF NO RELIEF CALL 911. (Patient taking differently: Place 0.4 mg under the tongue every 5 (five) minutes as needed for chest pain. ) 25 tablet 4  . pantoprazole (PROTONIX) 40 MG tablet Take 1 tablet (40 mg total) by mouth 2 (two) times daily before a meal. 60 tablet 3  . PROAIR HFA 108 (90 Base) MCG/ACT inhaler Inhale 2 puffs into the lungs every 6 (six) hours as needed for wheezing or shortness of breath.     . rosuvastatin (CRESTOR) 10 MG tablet Take 10 mg by mouth at bedtime.    . sertraline (ZOLOFT) 50 MG tablet Take 50 mg by mouth daily.     . temazepam (RESTORIL) 15 MG capsule Take 15 mg by mouth at bedtime.     Marland Kitchen HYDROcodone-acetaminophen (NORCO/VICODIN) 5-325 MG tablet Take 1-2 tablets by mouth every 4 (four) hours as needed (pain). (Patient not taking: Reported on 10/03/2019) 40 tablet 0    Results for orders placed or performed during the hospital encounter of 02/09/20 (from the past 48 hour(s))  SARS CORONAVIRUS 2 (TAT  6-24 HRS) Nasopharyngeal Nasopharyngeal Swab     Status: None   Collection Time: 02/09/20  8:07 AM   Specimen: Nasopharyngeal Swab  Result Value Ref Range   SARS Coronavirus 2 NEGATIVE NEGATIVE    Comment: (NOTE) SARS-CoV-2 target nucleic acids are NOT DETECTED.  The SARS-CoV-2 RNA is generally detectable in upper and lower respiratory specimens during the acute phase of infection. Negative results do not preclude SARS-CoV-2 infection, do not rule out co-infections with other pathogens, and should not be used as the sole basis for treatment or other patient management decisions. Negative results must be combined with clinical observations, patient history, and epidemiological information. The expected result is Negative.  Fact Sheet for Patients: SugarRoll.be  Fact Sheet for Healthcare Providers: https://www.woods-mathews.com/  This test is not yet approved or cleared by the [place] FDA and  has been authorized for detection and/or diagnosis of SARS-CoV-2 by FDA under an Emergency Use Authorization (EUA). This EUA will remain  in effect (meaning this test can be used) for the duration of the COVID-19 declaration under Se ction 564(b)(1) of the Act, 21 U.S.C. section 360bbb-3(b)(1), unless the authorization is terminated or revoked sooner.  Performed at [facility], [place] [address], [place],  [address]    No results found.  Review of Systems  Blood pressure 123/69, pulse (!) 57, temperature 97.7 F (36.5 C), temperature source Oral, resp. rate 20, height 5\' 6"  (1.676 m), weight 102.1 kg, SpO2 95 %. Physical Exam  HENT:  Mouth/Throat: Mucous membranes are moist. Oropharynx is clear.  Eyes: Conjunctivae are normal. No scleral icterus.  Cardiovascular: Normal rate, regular rhythm and normal heart sounds.  No murmur heard. Respiratory: Effort normal and breath sounds normal.  GI:  Abdomen is full.  He has  supraumbilical scar.  He has hernia of linea alba.  On palpation his abdomen is soft.  He has mild midepigastric and hypogastric tenderness.  No organomegaly or masses.  Musculoskeletal:        General: No swelling.     Cervical back: Neck supple.  Lymphadenopathy:    He has no cervical adenopathy.  Neurological: He is alert.  Psychiatric: Mood  normal.     Assessment/Plan Upper abdominal pain and melena. Diagnostic esophagogastroduodenoscopy.  Hildred Laser, MD 02/10/2020, 1:13 PM

## 2020-02-10 NOTE — Anesthesia Preprocedure Evaluation (Signed)
Anesthesia Evaluation  Patient identified by MRN, date of birth, ID band Patient awake    Reviewed: Allergy & Precautions, NPO status , Patient's Chart, lab work & pertinent test results  History of Anesthesia Complications (+) PONV and history of anesthetic complications  Airway Mallampati: III  TM Distance: >3 FB Neck ROM: Full    Dental  (+) Edentulous Upper, Missing,    Pulmonary asthma , sleep apnea and Continuous Positive Airway Pressure Ventilation , COPD, Current Smoker and Patient abstained from smoking.,    Pulmonary exam normal breath sounds clear to auscultation       Cardiovascular METS: 3 - Mets + angina with exertion Normal cardiovascular exam+ Valvular Problems/Murmurs  Rhythm:Regular Rate:Normal     Neuro/Psych  Headaches, PSYCHIATRIC DISORDERS Anxiety Depression    GI/Hepatic Neg liver ROS, GERD  Medicated,  Endo/Other  negative endocrine ROS  Renal/GU negative Renal ROS     Musculoskeletal  (+) Arthritis ,   Abdominal   Peds  Hematology negative hematology ROS (+)   Anesthesia Other Findings   Reproductive/Obstetrics                            Anesthesia Physical Anesthesia Plan  ASA: III  Anesthesia Plan: General   Post-op Pain Management:    Induction: Intravenous  PONV Risk Score and Plan: 1 and TIVA  Airway Management Planned: Nasal Cannula, Natural Airway and Simple Face Mask  Additional Equipment:   Intra-op Plan:   Post-operative Plan:   Informed Consent: I have reviewed the patients History and Physical, chart, labs and discussed the procedure including the risks, benefits and alternatives for the proposed anesthesia with the patient or authorized representative who has indicated his/her understanding and acceptance.     Dental advisory given  Plan Discussed with: CRNA and Surgeon  Anesthesia Plan Comments:        Anesthesia Quick  Evaluation

## 2020-02-10 NOTE — Transfer of Care (Signed)
Immediate Anesthesia Transfer of Care Note  Patient: Christopher [name]  Procedure(s) Performed: ESOPHAGOGASTRODUODENOSCOPY (EGD) WITH PROPOFOL (N/A ) BIOPSY  Patient Location: PACU  Anesthesia Type:General  Level of Consciousness: awake, alert , oriented and patient cooperative  Airway & Oxygen Therapy: Patient Spontanous Breathing  Post-op Assessment: Report given to RN, Post -op Vital signs reviewed and stable and Patient moving all extremities X 4  Post vital signs: Reviewed and stable  Last Vitals:  Vitals Value Taken Time  BP    Temp    Pulse 55 02/10/20 1342  Resp    SpO2 95 % 02/10/20 1342  Vitals shown include unvalidated device data.  Last Pain:  Vitals:   02/10/20 1154  TempSrc: Oral  PainSc: 7       Patients Stated Pain Goal: 10 (01/65/53 7482)  Complications: No complications documented.

## 2020-02-14 ENCOUNTER — Other Ambulatory Visit: Payer: Self-pay

## 2020-02-14 ENCOUNTER — Other Ambulatory Visit (HOSPITAL_COMMUNITY): Payer: PPO

## 2020-02-14 LAB — SURGICAL PATHOLOGY

## 2020-02-15 ENCOUNTER — Other Ambulatory Visit (HOSPITAL_COMMUNITY): Payer: PPO

## 2020-02-15 ENCOUNTER — Encounter (HOSPITAL_COMMUNITY): Payer: Self-pay | Admitting: Internal Medicine

## 2020-02-15 ENCOUNTER — Encounter (HOSPITAL_COMMUNITY): Payer: PPO

## 2020-02-16 ENCOUNTER — Encounter (HOSPITAL_COMMUNITY): Payer: Self-pay

## 2020-02-16 ENCOUNTER — Ambulatory Visit (HOSPITAL_COMMUNITY): Admit: 2020-02-16 | Payer: PPO | Admitting: Internal Medicine

## 2020-02-16 SURGERY — EGD (ESOPHAGOGASTRODUODENOSCOPY)
Anesthesia: Moderate Sedation

## 2020-02-21 ENCOUNTER — Other Ambulatory Visit (INDEPENDENT_AMBULATORY_CARE_PROVIDER_SITE_OTHER): Payer: Self-pay | Admitting: Internal Medicine

## 2020-02-21 DIAGNOSIS — R1013 Epigastric pain: Secondary | ICD-10-CM

## 2020-03-13 ENCOUNTER — Ambulatory Visit (HOSPITAL_COMMUNITY)
Admission: RE | Admit: 2020-03-13 | Discharge: 2020-03-13 | Disposition: A | Discharge status: Home / Self Care | Payer: PPO | Source: Ambulatory Visit | Attending: Internal Medicine | Admitting: Internal Medicine

## 2020-03-13 ENCOUNTER — Other Ambulatory Visit: Payer: Self-pay

## 2020-03-13 DIAGNOSIS — R1013 Epigastric pain: Secondary | ICD-10-CM | POA: Diagnosis not present

## 2020-03-13 DIAGNOSIS — K760 Fatty (change of) liver, not elsewhere classified: Secondary | ICD-10-CM | POA: Diagnosis not present

## 2020-03-13 DIAGNOSIS — K7689 Other specified diseases of liver: Secondary | ICD-10-CM | POA: Diagnosis not present

## 2020-03-13 DIAGNOSIS — I700 Atherosclerosis of aorta: Secondary | ICD-10-CM | POA: Diagnosis not present

## 2020-03-13 DIAGNOSIS — R109 Unspecified abdominal pain: Secondary | ICD-10-CM | POA: Diagnosis not present

## 2020-03-13 LAB — POCT I-STAT CREATININE: Creatinine, Ser: 0.9 mg/dL (ref 0.61–1.24)

## 2020-03-13 MED ORDER — IOHEXOL 300 MG/ML  SOLN
100.0000 mL | Freq: Once | INTRAMUSCULAR | Status: AC | PRN
  Administered 2020-03-13: 100 mL via INTRAVENOUS

## 2020-03-16 ENCOUNTER — Other Ambulatory Visit (INDEPENDENT_AMBULATORY_CARE_PROVIDER_SITE_OTHER): Payer: Self-pay | Admitting: Internal Medicine

## 2020-03-26 ENCOUNTER — Telehealth (INDEPENDENT_AMBULATORY_CARE_PROVIDER_SITE_OTHER): Payer: Self-pay | Admitting: Gastroenterology

## 2020-03-26 NOTE — Telephone Encounter (Signed)
I don't see where patient has had any recent labs - Dr Laural Golden discussed CT results with patient last Friday and was going to review w/ radiology.   Please find out what questions patient has and I can review w/ Dr Trey Sailors.   Per Dr Otelia Limes notes- CT results reviewed with patient. No abnormality noted to account for his abdominal pain. Does have bilateral renal cysts. Cyst in left kidney which is slightly enlarged since prior study of December 2016. I am not sure that further work-up is needed. Will review study with Dr. Thornton Papas next week. Patient advised to try dicyclomine 20 mg before breakfast and lunch. Patient is on Zoloft therefore unable to use amitriptyline because of drug interaction.

## 2020-03-26 NOTE — Telephone Encounter (Signed)
Patient called regarding test results - please advise - [phone]

## 2020-03-26 NOTE — Telephone Encounter (Signed)
Called and spoke with patient he wanted to know if Dr. Laural Golden has spoken with Dr. Thornton Papas, because he was told that would be getting a call and checking the status. I informed pt that Dr. Laural Golden is out of office today , however we mention this to him and once hear some we will call him back w/ answer. Sent a text to Dr. Laural Golden regarding this.

## 2020-04-05 ENCOUNTER — Telehealth (INDEPENDENT_AMBULATORY_CARE_PROVIDER_SITE_OTHER): Payer: Self-pay | Admitting: Gastroenterology

## 2020-04-05 NOTE — Telephone Encounter (Signed)
Dr Laural Golden - I am happy to call patient w/ results if you reviewed his scan w/ Dr Thornton Papas. Thanks.

## 2020-04-05 NOTE — Telephone Encounter (Signed)
Spouse left message stating they have waited for couple weeks for test results - please advise - ph# [phone]

## 2020-04-16 ENCOUNTER — Telehealth (INDEPENDENT_AMBULATORY_CARE_PROVIDER_SITE_OTHER): Payer: Self-pay | Admitting: *Deleted

## 2020-04-16 NOTE — Telephone Encounter (Signed)
Patient's wife called - Dr Laural Golden was supposed to reach out to another doctor about patient's CT of kidneys and they haven't heard anything yet - please call

## 2020-04-19 NOTE — Telephone Encounter (Signed)
Patient called but no answer 

## 2020-04-19 NOTE — Telephone Encounter (Signed)
Patient's wife called two times. In the messages she stated that it had been 4 weeks since they were told that Dr.Rehman was going to be calling (she states  an Materials engineer) . She stated that her husband was a bit anxious as it had been 4 weeks and they had not heard anything.  Dr.Rehman was made aware, he plans to call patient this evening.  I called the patient at [phone] and a message was left letting them know.

## 2020-04-23 ENCOUNTER — Other Ambulatory Visit: Payer: Self-pay

## 2020-04-23 ENCOUNTER — Ambulatory Visit (INDEPENDENT_AMBULATORY_CARE_PROVIDER_SITE_OTHER): Payer: PPO | Admitting: Orthopedic Surgery

## 2020-04-23 ENCOUNTER — Encounter: Payer: Self-pay | Admitting: Orthopedic Surgery

## 2020-04-23 VITALS — BP 147/69 | HR 55 | Ht 66.0 in | Wt 217.0 lb

## 2020-04-23 DIAGNOSIS — G8929 Other chronic pain: Secondary | ICD-10-CM | POA: Diagnosis not present

## 2020-04-23 DIAGNOSIS — M25511 Pain in right shoulder: Secondary | ICD-10-CM

## 2020-04-23 NOTE — Telephone Encounter (Signed)
After multiple attempts I was able to talk with Christopher [name] earlier today. I had called patient few weeks ago and twice last week and this morning from my cell phone but there was no answer. I am went over her CT with Dr. Lavonia Dana and compared with prior study and BS there is no change in the renal cyst. Therefore no further work-up recommended. Patient remains with abdominal pain. He will keep symptom diary until office visit in 8 to 10 weeks.

## 2020-04-23 NOTE — Telephone Encounter (Signed)
Addressed. Please see other note.

## 2020-04-23 NOTE — Progress Notes (Signed)
Chief Complaint  Patient presents with  . Shoulder Pain    right    Injection requested    Procedure note the subacromial injection shoulder RIGHT    Verbal consent was obtained to inject the  RIGHT   Shoulder  Timeout was completed to confirm the injection site is a subacromial space of the  RIGHT  shoulder   Medication used Depo-Medrol 40 mg and lidocaine 1% 3 cc  Anesthesia was provided by ethyl chloride  The injection was performed in the RIGHT  posterior subacromial space. After pinning the skin with alcohol and anesthetized the skin with ethyl chloride the subacromial space was injected using a 20-gauge needle. There were no complications  Sterile dressing was applied.  Encounter Diagnosis  Name Primary?  . Chronic right shoulder pain Yes

## 2020-06-04 ENCOUNTER — Other Ambulatory Visit (INDEPENDENT_AMBULATORY_CARE_PROVIDER_SITE_OTHER): Payer: Self-pay | Admitting: Gastroenterology

## 2020-06-04 NOTE — Telephone Encounter (Signed)
Last seen 02/02/2020 for Melena with Laurine Blazer.

## 2020-06-28 DIAGNOSIS — R7302 Impaired glucose tolerance (oral): Secondary | ICD-10-CM | POA: Diagnosis not present

## 2020-06-28 DIAGNOSIS — Z7289 Other problems related to lifestyle: Secondary | ICD-10-CM | POA: Diagnosis not present

## 2020-06-28 DIAGNOSIS — Z136 Encounter for screening for cardiovascular disorders: Secondary | ICD-10-CM | POA: Diagnosis not present

## 2020-06-28 DIAGNOSIS — Z125 Encounter for screening for malignant neoplasm of prostate: Secondary | ICD-10-CM | POA: Diagnosis not present

## 2020-06-28 DIAGNOSIS — Z23 Encounter for immunization: Secondary | ICD-10-CM | POA: Diagnosis not present

## 2020-06-28 DIAGNOSIS — Z0000 Encounter for general adult medical examination without abnormal findings: Secondary | ICD-10-CM | POA: Diagnosis not present

## 2020-06-28 DIAGNOSIS — Z13220 Encounter for screening for lipoid disorders: Secondary | ICD-10-CM | POA: Diagnosis not present

## 2020-07-02 DIAGNOSIS — H353131 Nonexudative age-related macular degeneration, bilateral, early dry stage: Secondary | ICD-10-CM | POA: Diagnosis not present

## 2020-07-05 ENCOUNTER — Ambulatory Visit (INDEPENDENT_AMBULATORY_CARE_PROVIDER_SITE_OTHER): Payer: PPO | Admitting: Gastroenterology

## 2020-07-05 ENCOUNTER — Encounter (INDEPENDENT_AMBULATORY_CARE_PROVIDER_SITE_OTHER): Payer: Self-pay | Admitting: Gastroenterology

## 2020-07-05 ENCOUNTER — Other Ambulatory Visit: Payer: Self-pay

## 2020-07-05 VITALS — BP 124/72 | HR 53 | Temp 98.1°F | Ht 66.0 in | Wt 219.1 lb

## 2020-07-05 DIAGNOSIS — K580 Irritable bowel syndrome with diarrhea: Secondary | ICD-10-CM

## 2020-07-05 DIAGNOSIS — Z86010 Personal history of colonic polyps: Secondary | ICD-10-CM

## 2020-07-05 DIAGNOSIS — K589 Irritable bowel syndrome without diarrhea: Secondary | ICD-10-CM | POA: Insufficient documentation

## 2020-07-05 NOTE — Progress Notes (Signed)
Maylon Peppers, M.D. Gastroenterology & Hepatology [facility] [address] [place], [place] [address]  Primary Care Physician: Leeanne Rio, MD [place] [id number]  I will communicate my assessment and recommendations to the referring MD via EMR. "Note: Occasional unusual wording and randomly placed punctuation marks may result from the use of speech recognition technology to transcribe this document"  Problems: 1. Irritable bowel syndrome 2. GERD  History of Present Illness: Christopher [name] is a 64 y.o. male with past medical history of anxiety, asthma, BPH, depression, GERD, IBS, sleep apnea, who presents for follow up of flank pain.  The patient was last seen on 02/02/2020. At that time, the patient was being evaluated for episodes of melena.  He was scheduled to undergo an EGD and was started on Protonix 40 mg every day.  He underwent an EGD showing findings described below.  Patient reports feeling well today.  He states that he has had intermittent episodes of pain in both flanks but this has been controlled while taking Bentyl as needed.  He states he is having 1 bowel movement with each meal which is usually watery in nature but he has not presented any episodes of fecal soiling.  He states that he has had this change in his bowel movements chronically for multiple years.  Usually, the bowel movements are watery when he eats fatty food.  The patient denies having any nausea, vomiting, fever, chills, hematochezia, melena, hematemesis, abdominal distention, jaundice, pruritus or weight loss.  Last EGD: 2021 - - Normal hypopharynx. - Normal esophagus. - Z-line irregular, 40 cm from the incisors. - 2 cm hiatal hernia. - Gastritis. Biopsied. Antral GIM, neg to have H. Pylori. - Normal duodenal bulb and second portion of the duodenum.  Last Colonoscopy: 2019  - One 5 mm polyp at the hepatic flexure, removed  with a cold snare. Complete resection. Partial retrieval. - Four small polyps in the sigmoid colon and at the splenic flexure, removed with a cold snare. Resected and retrieved. - External hemorrhoids. All polyps were TA.  CT abdomen pelvis w/ IV con 03/13/2020 1. No acute findings are noted in the abdomen or pelvis to account for the patient's symptoms. 2. Multiple renal lesions bilaterally, most of which are compatible with simple cysts. In addition, there is a 2.7 cm lesion in the posterior aspect of the upper pole the left kidney which is statistically likely to represent a proteinaceous/hemorrhagic cyst, only mildly larger than prior examination from 2016. This could be definitively characterized with follow-up nonemergent abdominal MRI with and without IV gadolinium if of clinical concern. 3. Hepatic steatosis.  Past Medical History: Past Medical History:  Diagnosis Date  . Anxiety   . Arthritis   . Asthma   . BPH (benign prostatic hyperplasia)   . Complication of anesthesia    pt had a hard time being able to move after spinal anesthesia , 3-4 hours  . Depression   . GERD (gastroesophageal reflux disease)   . Gout    no meds  . Headache(784.0)    otc meds prn  . Heart murmur    dx as a child, no problems as an adult  . Hyperlipidemia   . IBS (irritable bowel syndrome)   . PONV (postoperative nausea and vomiting)   . Pre-diabetes    Borderline, diet and exercise, no med  . Sleep apnea    uses CIPAP machine at night  .  Wears partial dentures    bottom partial    Past Surgical History: Past Surgical History:  Procedure Laterality Date  . APPENDECTOMY    . BACK SURGERY     neck and back fusion  . BIOPSY  12/27/2015   Procedure: BIOPSY;  Surgeon: Rogene Houston, MD;  [facility];  Service: Endoscopy;;  Fundus biopsies and duodenal biopsies  . BIOPSY  02/10/2020   Procedure: BIOPSY;  Surgeon: Rogene Houston, MD;  [facility];  Service:  Endoscopy;;  antral  . CARDIAC CATHETERIZATION    . CARDIAC CATHETERIZATION N/A 09/04/2016   Procedure: Right/Left Heart Cath and Coronary Angiography;  Surgeon: Peter M Martinique, MD;  Location: [place] [facility];  Service: Cardiovascular;  Laterality: N/A;  . CHOLECYSTECTOMY    . CHONDROPLASTY  08/15/2011   Procedure: CHONDROPLASTY;  Surgeon: [name], MD;  [facility]: [facility];  Service: Orthopedics;  Laterality: Left;  . COLONOSCOPY  06/27/2011   Procedure: COLONOSCOPY;  Surgeon: Rogene Houston, MD;  [facility];  Service: Endoscopy;  Laterality: N/A;  9:00 / Pt to be here at 9am for 10:45 procedure, benign polyps removed  . COLONOSCOPY N/A 10/05/2014   Procedure: COLONOSCOPY;  Surgeon: Rogene Houston, MD;  [facility];  Service: Endoscopy;  Laterality: N/A;  930  . COLONOSCOPY N/A 02/11/2018   Procedure: COLONOSCOPY;  Surgeon: Rogene Houston, MD;  [facility];  Service: Endoscopy;  Laterality: N/A;  830  . ESOPHAGOGASTRODUODENOSCOPY N/A 12/27/2015   Procedure: ESOPHAGOGASTRODUODENOSCOPY (EGD);  Surgeon: Rogene Houston, MD;  [facility];  Service: Endoscopy;  Laterality: N/A;  3:00  . ESOPHAGOGASTRODUODENOSCOPY (EGD) WITH PROPOFOL N/A 02/10/2020   Procedure: ESOPHAGOGASTRODUODENOSCOPY (EGD) WITH PROPOFOL;  Surgeon: Rogene Houston, MD;  [facility];  Service: Endoscopy;  Laterality: N/A;  155  . HERNIA REPAIR     umbilical hernia  . JOINT REPLACEMENT     knee and shoulder  . KNEE ARTHROSCOPY     left knee  . KNEE ARTHROSCOPY     right knee   . LUMBAR LAMINECTOMY/DECOMPRESSION MICRODISCECTOMY  09/14/2012   Procedure: LUMBAR LAMINECTOMY/DECOMPRESSION MICRODISCECTOMY 1 LEVEL;  Surgeon: Floyce Stakes, MD;  Location: [place] [facility];  Service: Neurosurgery;  Laterality: Right;  Right Lumbar three-four Diskectomy  . neck fusion    . POLYPECTOMY  02/11/2018   Procedure: POLYPECTOMY;  Surgeon: Rogene Houston, MD;  [facility];  Service: Endoscopy;;  colon  . REVISION TOTAL SHOULDER TO REVERSE TOTAL SHOULDER Left 12/16/2018   Procedure: REVISION TOTAL SHOULDER TO REVERSE TOTAL SHOULDER;  Surgeon: Meredith Pel, MD;  Location: [place];  Service: Orthopedics;  Laterality: Left;  . SHOULDER ARTHROSCOPY WITH BICEPSTENOTOMY Left 09/23/2013   Procedure: SHOULDER ARTHROSCOPY WITH BICEPSTENOTOMY AND EXTENSIVE DEBRIDEMENT;  Surgeon: Carole Civil, MD;  [facility];  Service: Orthopedics;  Laterality: Left;  . SHOULDER ARTHROSCOPY WITH ROTATOR CUFF REPAIR Left 05/05/2014   Procedure: SHOULDER ARTHROSCOPY LIMITED DEBRIDEMENT;  Surgeon: Carole Civil, MD;  [facility];  Service: Orthopedics;  Laterality: Left;  . SHOULDER OPEN ROTATOR CUFF REPAIR Left 05/05/2014   Procedure: ROTATOR CUFF REPAIR SHOULDER OPEN;  Surgeon: Carole Civil, MD;  [facility];  Service: Orthopedics;  Laterality: Left;  . SHOULDER OPEN ROTATOR CUFF REPAIR Left 12/16/2018   Procedure: LEFT SHOULDER POSSIBLE SUBSCAPULARIS REPAIR VS. REVISION TO REVERSE TOTAL SHOULDER REPLACEMENT;  Surgeon: Meredith Pel, MD;  Location: [place];  Service: Orthopedics;  Laterality: Left;  . SHOULDER SURGERY Right    Open Mumford procedure  . SPINAL FUSION     x 2  . TOTAL KNEE ARTHROPLASTY Left 06/16/2017   Procedure: LEFT TOTAL KNEE ARTHROPLASTY;  Surgeon: Carole Civil, MD;  [facility];  Service: Orthopedics;  Laterality: Left;  . TOTAL SHOULDER ARTHROPLASTY Left 08/19/2018   Procedure: left shoulder replacement;  Surgeon: Meredith Pel, MD;  Location: [place];  Service: Orthopedics;  Laterality: Left;    Family History: Family History  Problem Relation Age of Onset  . Diabetes Other   . Lung disease Other   . Arthritis Other   . Anesthesia problems Neg Hx   . Hypotension Neg Hx   . Malignant hyperthermia Neg Hx   . Pseudochol deficiency Neg Hx     Social History: Social History   Tobacco Use  Smoking  Status Current Every Day Smoker  . Packs/day: 1.00  . Years: 44.00  . Pack years: 44.00  . Types: Cigarettes  Smokeless Tobacco Never Used  Tobacco Comment   smokes a pack a day. since age 52   Social History   Substance and Sexual Activity  Alcohol Use No  . Alcohol/week: 0.0 standard drinks   Social History   Substance and Sexual Activity  Drug Use No    Allergies: Allergies  Allergen Reactions  . Celebrex [Celecoxib] Itching, Swelling and Other (See Comments)    All over  . Cortisone Swelling    SWELLING REACTION UNSPECIFIED   . Doxycycline Swelling and Other (See Comments)    Made tongue turn black   . Prednisone Swelling    SWELLING REACTION UNSPECIFIED   . Relafen [Nabumetone] Swelling    SWELLING REACTION UNSPECIFIED   . Codeine Nausea And Vomiting and Other (See Comments)    Extreme stomach pain. This includes anything with the derivative of codeine in it.  . Oxycodone-Acetaminophen Itching and Other (See Comments)    Can not  tolerate with benadryl     Medications: Current Outpatient Medications  Medication Sig Dispense Refill  . dicyclomine (BENTYL) 10 MG capsule TAKE 1 CAPSULE THREE TIMES DAILY BEFORE MEALS (Patient taking differently: Take 10 mg by mouth every other day. ) 90 capsule 3  . Menthol, Topical Analgesic, (ICY HOT EX) Apply 1 application topically daily as needed (pain). Per patient he will use as needed.     . nitroGLYCERIN (NITROSTAT) 0.4 MG SL tablet PLACE (1) TABLET UNDER TONGUE EVERY 5 MINUTES UP TO (3) DOSES. IF NO RELIEF CALL 911. (Patient taking differently: Place 0.4 mg under the tongue every 5 (five) minutes as needed for chest pain. ) 25 tablet 4  . pantoprazole (PROTONIX) 40 MG tablet TAKE (1) TABLET TWICE A DAY BEFORE MEALS. 60 tablet 0  . PROAIR HFA 108 (90 Base) MCG/ACT inhaler Inhale 2 puffs into the lungs every 6 (six) hours as needed for wheezing or shortness of breath.     . rosuvastatin (CRESTOR) 10 MG tablet Take 10 mg by  mouth at bedtime.    . sertraline (ZOLOFT) 50 MG tablet Take 50 mg by mouth daily.     . temazepam (RESTORIL) 15 MG capsule Take 15 mg by mouth at bedtime.      No current facility-administered medications for this visit.    Review of Systems: GENERAL: negative for malaise, night sweats HEENT: No changes in hearing or vision, no nose bleeds or other nasal problems. NECK: Negative  for lumps, goiter, pain and significant neck swelling RESPIRATORY: Negative for cough, wheezing CARDIOVASCULAR: Negative for chest pain, leg swelling, palpitations, orthopnea GI: SEE HPI MUSCULOSKELETAL: Negative for joint pain or swelling, back pain, and muscle pain. SKIN: Negative for lesions, rash PSYCH: Negative for sleep disturbance, mood disorder and recent psychosocial stressors. HEMATOLOGY Negative for prolonged bleeding, bruising easily, and swollen nodes. ENDOCRINE: Negative for cold or heat intolerance, polyuria, polydipsia and goiter. NEURO: negative for tremor, gait imbalance, syncope and seizures. The remainder of the review of systems is noncontributory.   Physical Exam: BP 124/72 (BP Location: Right Arm, Patient Position: Sitting, Cuff Size: Normal)   Pulse (!) 53   Temp 98.1 F (36.7 C) (Oral)   Ht 5\' 6"  (1.676 m)   Wt 219 lb 1.6 oz (99.4 kg)   BMI 35.36 kg/m  GENERAL: The patient is AO x3, in no acute distress. Obese. HEENT: Head is normocephalic and atraumatic. EOMI are intact. Mouth is well hydrated and without lesions. NECK: Supple. No masses LUNGS: Clear to auscultation. No presence of rhonchi/wheezing/rales. Adequate chest expansion HEART: RRR, normal s1 and s2. ABDOMEN: Soft, nontender, no guarding, no peritoneal signs, and nondistended. BS +. No masses. EXTREMITIES: Without any cyanosis, clubbing, rash, lesions or edema. NEUROLOGIC: AOx3, no focal motor deficit. SKIN: no jaundice, no rashes  Imaging/Labs: as above  I personally reviewed and interpreted the available labs,  imaging and endoscopic files.  Impression and Plan: Christopher [name] is a 65 y.o. male with past medical history of anxiety, asthma, BPH, depression, GERD, IBS, sleep apnea, who presents for follow up of flank pain.  The patient has presented chronic symptoms of abdominal pain as well as loose bowel movements after eating which are consistent with his history of irritable bowel syndrome.  He has not presented any red flag signs.  He has presented improvement of symptoms while taking Bentyl.  For now, he should continue taking the medication compliantly as this has controlled his symptoms.  If he has diarrhea, he can take Imodium as needed.  He will need to have a repeat colonoscopy next year June for colorectal cancer screening.  - Continue Levsin as needed for abdominal pain - Start Imodium 1 tablet as needed for diarrhea - Patient to call back in June 2022 to schedule surveillance colonoscopy  All questions were answered.      Harvel Quale, MD Gastroenterology and Hepatology [facility]

## 2020-07-05 NOTE — Patient Instructions (Signed)
Continue Levsin as needed for abdominal pain Start Imodium 1 tablet as needed for diarrhea Call back in June 2022 to schedule surveillance colonoscopy

## 2020-07-19 ENCOUNTER — Telehealth: Payer: Self-pay | Admitting: Orthopedic Surgery

## 2020-07-19 NOTE — Telephone Encounter (Signed)
Patient's wife, designated contact Zelphia Cairo, called to inquire if Dr Aline Brochure was in the office. Relayed he has left for the day, and will be out through next week (week of holiday).  States he has just had an accident and hurt his neck by trying to lift a lawnmower off a trailer.  Relayed our clinic has no other providers in office this afternoon, however, protocol is to have evaluation and work up at emergency room.  States she may first call [facility] due to patient having history of fusion surgery; agreed would be wise to start there to be further advised; otherwise, emergency room.

## 2020-07-20 DIAGNOSIS — M542 Cervicalgia: Secondary | ICD-10-CM | POA: Diagnosis not present

## 2020-08-03 ENCOUNTER — Other Ambulatory Visit: Payer: Self-pay | Admitting: Neurosurgery

## 2020-08-03 DIAGNOSIS — M542 Cervicalgia: Secondary | ICD-10-CM | POA: Diagnosis not present

## 2020-08-03 DIAGNOSIS — F0781 Postconcussional syndrome: Secondary | ICD-10-CM | POA: Diagnosis not present

## 2020-08-03 DIAGNOSIS — M5136 Other intervertebral disc degeneration, lumbar region: Secondary | ICD-10-CM

## 2020-08-03 DIAGNOSIS — R030 Elevated blood-pressure reading, without diagnosis of hypertension: Secondary | ICD-10-CM | POA: Diagnosis not present

## 2020-08-23 ENCOUNTER — Ambulatory Visit
Admission: RE | Admit: 2020-08-23 | Discharge: 2020-08-23 | Disposition: A | Discharge status: Home / Self Care | Payer: PPO | Source: Ambulatory Visit | Attending: Neurosurgery | Admitting: Neurosurgery

## 2020-08-23 DIAGNOSIS — M542 Cervicalgia: Secondary | ICD-10-CM

## 2020-08-23 DIAGNOSIS — M545 Low back pain, unspecified: Secondary | ICD-10-CM | POA: Diagnosis not present

## 2020-08-23 DIAGNOSIS — Z981 Arthrodesis status: Secondary | ICD-10-CM | POA: Diagnosis not present

## 2020-08-23 DIAGNOSIS — F0781 Postconcussional syndrome: Secondary | ICD-10-CM

## 2020-08-23 DIAGNOSIS — M5136 Other intervertebral disc degeneration, lumbar region: Secondary | ICD-10-CM

## 2020-08-23 DIAGNOSIS — M2578 Osteophyte, vertebrae: Secondary | ICD-10-CM | POA: Diagnosis not present

## 2020-08-23 DIAGNOSIS — M47812 Spondylosis without myelopathy or radiculopathy, cervical region: Secondary | ICD-10-CM | POA: Diagnosis not present

## 2020-08-23 DIAGNOSIS — R519 Headache, unspecified: Secondary | ICD-10-CM | POA: Diagnosis not present

## 2020-08-30 ENCOUNTER — Other Ambulatory Visit: Payer: Self-pay | Admitting: Internal Medicine

## 2020-08-30 DIAGNOSIS — F0781 Postconcussional syndrome: Secondary | ICD-10-CM | POA: Diagnosis not present

## 2020-08-30 DIAGNOSIS — R030 Elevated blood-pressure reading, without diagnosis of hypertension: Secondary | ICD-10-CM | POA: Diagnosis not present

## 2020-08-30 DIAGNOSIS — M542 Cervicalgia: Secondary | ICD-10-CM | POA: Diagnosis not present

## 2020-08-30 DIAGNOSIS — Z6835 Body mass index (BMI) 35.0-35.9, adult: Secondary | ICD-10-CM | POA: Diagnosis not present

## 2020-09-25 ENCOUNTER — Other Ambulatory Visit: Payer: PPO

## 2020-09-25 DIAGNOSIS — Z20822 Contact with and (suspected) exposure to covid-19: Secondary | ICD-10-CM | POA: Diagnosis not present

## 2020-09-26 LAB — SARS-COV-2, NAA 2 DAY TAT

## 2020-09-26 LAB — NOVEL CORONAVIRUS, NAA: SARS-CoV-2, NAA: NOT DETECTED

## 2020-10-03 ENCOUNTER — Encounter: Payer: Self-pay | Admitting: Urology

## 2020-10-03 ENCOUNTER — Ambulatory Visit (INDEPENDENT_AMBULATORY_CARE_PROVIDER_SITE_OTHER): Payer: PPO | Admitting: Urology

## 2020-10-03 ENCOUNTER — Other Ambulatory Visit: Payer: Self-pay

## 2020-10-03 VITALS — BP 130/66 | HR 63 | Temp 98.3°F | Ht 63.0 in | Wt 220.0 lb

## 2020-10-03 DIAGNOSIS — N138 Other obstructive and reflux uropathy: Secondary | ICD-10-CM | POA: Diagnosis not present

## 2020-10-03 DIAGNOSIS — N281 Cyst of kidney, acquired: Secondary | ICD-10-CM | POA: Insufficient documentation

## 2020-10-03 DIAGNOSIS — R350 Frequency of micturition: Secondary | ICD-10-CM | POA: Diagnosis not present

## 2020-10-03 DIAGNOSIS — N401 Enlarged prostate with lower urinary tract symptoms: Secondary | ICD-10-CM | POA: Insufficient documentation

## 2020-10-03 LAB — URINALYSIS, ROUTINE W REFLEX MICROSCOPIC
Bilirubin, UA: NEGATIVE
Glucose, UA: NEGATIVE
Ketones, UA: NEGATIVE
Leukocytes,UA: NEGATIVE
Nitrite, UA: NEGATIVE
Protein,UA: NEGATIVE
Specific Gravity, UA: 1.02 (ref 1.005–1.030)
Urobilinogen, Ur: 1 mg/dL (ref 0.2–1.0)
pH, UA: 6 (ref 5.0–7.5)

## 2020-10-03 LAB — MICROSCOPIC EXAMINATION
Bacteria, UA: NONE SEEN
Epithelial Cells (non renal): NONE SEEN /hpf (ref 0–10)
Renal Epithel, UA: NONE SEEN /hpf
WBC, UA: NONE SEEN /hpf (ref 0–5)

## 2020-10-03 LAB — BLADDER SCAN AMB NON-IMAGING: Scan Result: 13

## 2020-10-03 MED ORDER — ALFUZOSIN HCL ER 10 MG PO TB24
10.0000 mg | ORAL_TABLET | Freq: Every day | ORAL | 11 refills | Status: DC
Start: 2020-10-03 — End: 2021-01-17

## 2020-10-03 NOTE — Progress Notes (Signed)
Bladder Scan Patient can void: 13 ml Performed By: Akiva Brassfield,lpn   Urological Symptom Review  Patient is experiencing the following symptoms: Frequent urination Hard to postpone urination Get up at night to urinate   Review of Systems  Gastrointestinal (upper)  : Indigestion/heartburn  Gastrointestinal (lower) : Diarrhea  Constitutional : Negative for symptoms  Skin: Negative for skin symptoms  Eyes: Negative for eye symptoms  Ear/Nose/Throat : Negative for Ear/Nose/Throat symptoms  Hematologic/Lymphatic: Negative for Hematologic/Lymphatic symptoms  Cardiovascular : Chest pain  Respiratory : Shortness of breath  Endocrine: Excessive thirst  Musculoskeletal: Back pain Joint pain  Neurological: Headaches  Psychologic: Depression Anxiety

## 2020-10-03 NOTE — Progress Notes (Signed)
10/03/2020 11:38 AM   Christopher [name] [birth year] [id number]  Referring provider: Leeanne Rio, MD [place],  [place] [address]  Renal mass  HPI: Christopher [name] is a 65yo here for evaluation of bilateral renal cysts and left renal mass. CT fro 03/13/2020 showed bilateral renal cysts and a 2.7cm complex renal cyst/mass. He has moderate to severe LUTS on no BPH therapy. He has urinary frequency every 20-30 minutes, nocturia 3-4x. Stream fair. He has associated urinary urgency, hesitancy, starting/stopping, dribbling. Rare urge incontinence    PMH: Past Medical History:  Diagnosis Date  . Anxiety   . Arthritis   . Asthma   . BPH (benign prostatic hyperplasia)   . Complication of anesthesia    pt had a hard time being able to move after spinal anesthesia , 3-4 hours  . Depression   . GERD (gastroesophageal reflux disease)   . Gout    no meds  . Headache(784.0)    otc meds prn  . Heart murmur    dx as a child, no problems as an adult  . Hyperlipidemia   . IBS (irritable bowel syndrome)   . PONV (postoperative nausea and vomiting)   . Pre-diabetes    Borderline, diet and exercise, no med  . Sleep apnea    uses CIPAP machine at night  . Wears partial dentures    bottom partial    Surgical History: Past Surgical History:  Procedure Laterality Date  . APPENDECTOMY    . BACK SURGERY     neck and back fusion  . BIOPSY  12/27/2015   Procedure: BIOPSY;  Surgeon: Rogene Houston, MD;  [facility];  Service: Endoscopy;;  Fundus biopsies and duodenal biopsies  . BIOPSY  02/10/2020   Procedure: BIOPSY;  Surgeon: Rogene Houston, MD;  [facility];  Service: Endoscopy;;  antral  . CARDIAC CATHETERIZATION    . CARDIAC CATHETERIZATION N/A 09/04/2016   Procedure: Right/Left Heart Cath and Coronary Angiography;  Surgeon: Peter M Martinique, MD;  Location: [place] [facility];  Service: Cardiovascular;  Laterality: N/A;  . CHOLECYSTECTOMY    .  CHONDROPLASTY  08/15/2011   Procedure: CHONDROPLASTY;  Surgeon: [name], MD;  [facility];  Service: Orthopedics;  Laterality: Left;  . COLONOSCOPY  06/27/2011   Procedure: COLONOSCOPY;  Surgeon: Rogene Houston, MD;  [facility];  Service: Endoscopy;  Laterality: N/A;  9:00 / Pt to be here at 9am for 10:45 procedure, benign polyps removed  . COLONOSCOPY N/A 10/05/2014   Procedure: COLONOSCOPY;  Surgeon: Rogene Houston, MD;  [facility];  Service: Endoscopy;  Laterality: N/A;  930  . COLONOSCOPY N/A 02/11/2018   Procedure: COLONOSCOPY;  Surgeon: Rogene Houston, MD;  [facility];  Service: Endoscopy;  Laterality: N/A;  830  . ESOPHAGOGASTRODUODENOSCOPY N/A 12/27/2015   Procedure: ESOPHAGOGASTRODUODENOSCOPY (EGD);  Surgeon: Rogene Houston, MD;  [facility];  Service: Endoscopy;  Laterality: N/A;  3:00  . ESOPHAGOGASTRODUODENOSCOPY (EGD) WITH PROPOFOL N/A 02/10/2020   Procedure: ESOPHAGOGASTRODUODENOSCOPY (EGD) WITH PROPOFOL;  Surgeon: Rogene Houston, MD;  [facility];  Service: Endoscopy;  Laterality: N/A;  155  . HERNIA REPAIR     umbilical hernia  . JOINT REPLACEMENT     knee and shoulder  . KNEE ARTHROSCOPY     left knee  . KNEE ARTHROSCOPY     right knee   . LUMBAR LAMINECTOMY/DECOMPRESSION  MICRODISCECTOMY  09/14/2012   Procedure: LUMBAR LAMINECTOMY/DECOMPRESSION MICRODISCECTOMY 1 LEVEL;  Surgeon: Floyce Stakes, MD;  Location: [place] [facility];  Service: Neurosurgery;  Laterality: Right;  Right Lumbar three-four Diskectomy  . neck fusion    . POLYPECTOMY  02/11/2018   Procedure: POLYPECTOMY;  Surgeon: Rogene Houston, MD;  [facility];  Service: Endoscopy;;  colon  . REVISION TOTAL SHOULDER TO REVERSE TOTAL SHOULDER Left 12/16/2018   Procedure: REVISION TOTAL SHOULDER TO REVERSE TOTAL SHOULDER;  Surgeon: Meredith Pel, MD;  Location: [place];  Service: Orthopedics;  Laterality: Left;  . SHOULDER  ARTHROSCOPY WITH BICEPSTENOTOMY Left 09/23/2013   Procedure: SHOULDER ARTHROSCOPY WITH BICEPSTENOTOMY AND EXTENSIVE DEBRIDEMENT;  Surgeon: Carole Civil, MD;  [facility];  Service: Orthopedics;  Laterality: Left;  . SHOULDER ARTHROSCOPY WITH ROTATOR CUFF REPAIR Left 05/05/2014   Procedure: SHOULDER ARTHROSCOPY LIMITED DEBRIDEMENT;  Surgeon: Carole Civil, MD;  [facility];  Service: Orthopedics;  Laterality: Left;  . SHOULDER OPEN ROTATOR CUFF REPAIR Left 05/05/2014   Procedure: ROTATOR CUFF REPAIR SHOULDER OPEN;  Surgeon: Carole Civil, MD;  [facility];  Service: Orthopedics;  Laterality: Left;  . SHOULDER OPEN ROTATOR CUFF REPAIR Left 12/16/2018   Procedure: LEFT SHOULDER POSSIBLE SUBSCAPULARIS REPAIR VS. REVISION TO REVERSE TOTAL SHOULDER REPLACEMENT;  Surgeon: Meredith Pel, MD;  Location: [facility];  Service: Orthopedics;  Laterality: Left;  . SHOULDER SURGERY Right    Open Mumford procedure  . SPINAL FUSION     x 2  . TOTAL KNEE ARTHROPLASTY Left 06/16/2017   Procedure: LEFT TOTAL KNEE ARTHROPLASTY;  Surgeon: Carole Civil, MD;  [facility];  Service: Orthopedics;  Laterality: Left;  . TOTAL SHOULDER ARTHROPLASTY Left 08/19/2018   Procedure: left shoulder replacement;  Surgeon: Meredith Pel, MD;  Location: [place];  Service: Orthopedics;  Laterality: Left;    Home Medications:  Allergies as of 10/03/2020      Reactions   Celebrex [celecoxib] Itching, Swelling, Other (See Comments)   All over   Cortisone Swelling   SWELLING REACTION UNSPECIFIED    Doxycycline Swelling, Other (See Comments)   Made tongue turn black    Prednisone Swelling   SWELLING REACTION UNSPECIFIED    Relafen [nabumetone] Swelling   SWELLING REACTION UNSPECIFIED    Codeine Nausea And Vomiting, Other (See Comments)   Extreme stomach pain. This includes anything with the derivative of codeine in it.   Oxycodone-acetaminophen Itching, Other (See Comments)   Can not   tolerate with benadryl       Medication List       Accurate as of October 03, 2020 11:38 AM. If you have any questions, ask your nurse or doctor.        cyclobenzaprine 5 MG tablet Commonly known as: FLEXERIL   dicyclomine 10 MG capsule Commonly known as: BENTYL TAKE 1 CAPSULE THREE TIMES DAILY BEFORE MEALS What changed: See the new instructions.   ICY HOT EX Apply 1 application topically daily as needed (pain). Per patient he will use as needed.   nitroGLYCERIN 0.4 MG SL tablet Commonly known as: NITROSTAT PLACE (1) TABLET UNDER TONGUE EVERY 5 MINUTES UP TO (3) DOSES. IF NO RELIEF CALL 911. What changed: See the new instructions.   pantoprazole 40 MG tablet Commonly known as: PROTONIX TAKE (1) TABLET TWICE A DAY BEFORE MEALS.   ProAir HFA 108 (90 Base) MCG/ACT inhaler Generic drug: albuterol Inhale 2 puffs into the lungs every 6 (six)  hours as needed for wheezing or shortness of breath.   rosuvastatin 10 MG tablet Commonly known as: CRESTOR Take 10 mg by mouth at bedtime.   sertraline 50 MG tablet Commonly known as: ZOLOFT Take 50 mg by mouth daily.   temazepam 15 MG capsule Commonly known as: RESTORIL Take 15 mg by mouth at bedtime.   temazepam 7.5 MG capsule Commonly known as: RESTORIL Take 7.5 mg by mouth at bedtime.       Allergies:  Allergies  Allergen Reactions  . Celebrex [Celecoxib] Itching, Swelling and Other (See Comments)    All over  . Cortisone Swelling    SWELLING REACTION UNSPECIFIED   . Doxycycline Swelling and Other (See Comments)    Made tongue turn black   . Prednisone Swelling    SWELLING REACTION UNSPECIFIED   . Relafen [Nabumetone] Swelling    SWELLING REACTION UNSPECIFIED   . Codeine Nausea And Vomiting and Other (See Comments)    Extreme stomach pain. This includes anything with the derivative of codeine in it.  . Oxycodone-Acetaminophen Itching and Other (See Comments)    Can not  tolerate with benadryl     Family  History: Family History  Problem Relation Age of Onset  . Diabetes Other   . Lung disease Other   . Arthritis Other   . Anesthesia problems Neg Hx   . Hypotension Neg Hx   . Malignant hyperthermia Neg Hx   . Pseudochol deficiency Neg Hx     Social History:  reports that he has been smoking cigarettes. He has a 44.00 pack-year smoking history. He has never used smokeless tobacco. He reports that he does not drink alcohol and does not use drugs.  ROS: All other review of systems were reviewed and are negative except what is noted above in HPI  Physical Exam: BP 130/66   Pulse 63   Temp 98.3 F (36.8 C)   Ht 5\' 3"  (1.6 m)   Wt 220 lb (99.8 kg)   BMI 38.97 kg/m   Constitutional:  Alert and oriented, No acute distress. HEENT: [place] AT, moist mucus membranes.  Trachea midline, no masses. Cardiovascular: No clubbing, cyanosis, or edema. Respiratory: Normal respiratory effort, no increased work of breathing. GI: Abdomen is soft, nontender, nondistended, no abdominal masses GU: No CVA tenderness.  Lymph: No cervical or inguinal lymphadenopathy. Skin: No rashes, bruises or suspicious lesions. Neurologic: Grossly intact, no focal deficits, moving all 4 extremities. Psychiatric: Normal mood and affect.  Laboratory Data: Lab Results  Component Value Date   WBC 9.5 02/02/2020   HGB 14.7 02/02/2020   HCT 43.6 02/02/2020   MCV 83.4 02/02/2020   PLT 241 02/02/2020    Lab Results  Component Value Date   CREATININE 0.90 03/13/2020    No results found for: PSA  No results found for: TESTOSTERONE  Lab Results  Component Value Date   HGBA1C 5.6 06/11/2017    Urinalysis    Component Value Date/Time   COLORURINE YELLOW 12/16/2018 1020   APPEARANCEUR CLEAR 12/16/2018 1020   LABSPEC 1.015 12/16/2018 1020   PHURINE 6.0 12/16/2018 1020   GLUCOSEU NEGATIVE 12/16/2018 1020   HGBUR NEGATIVE 12/16/2018 1020   BILIRUBINUR NEGATIVE 12/16/2018 1020   KETONESUR NEGATIVE 12/16/2018  1020   PROTEINUR NEGATIVE 12/16/2018 1020   NITRITE NEGATIVE 12/16/2018 1020   LEUKOCYTESUR NEGATIVE 12/16/2018 1020    No results found for: LABMICR, WBCUA, RBCUA, LABEPIT, MUCUS, BACTERIA  Pertinent Imaging: CT 03/13/2020: Images reviewed and discussed with the  patient Results for orders placed during the hospital encounter of 02/19/10  DG Abd 1 View  Narrative Clinical Data: Vomiting and abdominal pain.  Status post lumbar spine surgery 2 days ago.  ABDOMEN - 1 VIEW  Comparison: None.  Findings: Mild gaseous distention of the colon with scattered air fluid levels, including the rectum.  Surgical clips in the right upper and lower abdomen.  Laminectomy defects, interbody spacer and pedicle screw and rod fixation at the L4-5 level.  Minimal dextroconvex lumbar scoliosis.  IMPRESSION: Mild colonic ileus.  Provider: Rober Minion  No results found for this or any previous visit.  No results found for this or any previous visit.  No results found for this or any previous visit.  No results found for this or any previous visit.  No results found for this or any previous visit.  No results found for this or any previous visit.  No results found for this or any previous visit.   Assessment & Plan:    1. Renal cyst -We will obtain MRI w/wo contrast - BLADDER SCAN AMB NON-IMAGING  2. Frequency of micturition -uroxatral 10mg    3. Benign prostatic hyperplasia with urinary obstruction -Uroxatral 10mg  qhs   No follow-ups on file.  Nicolette Bang, MD  [facility] [place]

## 2020-10-03 NOTE — Patient Instructions (Signed)

## 2020-10-18 ENCOUNTER — Ambulatory Visit: Payer: PPO

## 2020-10-18 ENCOUNTER — Ambulatory Visit (INDEPENDENT_AMBULATORY_CARE_PROVIDER_SITE_OTHER): Payer: PPO | Admitting: Orthopedic Surgery

## 2020-10-18 ENCOUNTER — Encounter: Payer: Self-pay | Admitting: Orthopedic Surgery

## 2020-10-18 ENCOUNTER — Other Ambulatory Visit: Payer: Self-pay

## 2020-10-18 VITALS — BP 139/71 | HR 71 | Ht 66.0 in | Wt 217.0 lb

## 2020-10-18 DIAGNOSIS — G8929 Other chronic pain: Secondary | ICD-10-CM

## 2020-10-18 DIAGNOSIS — M25511 Pain in right shoulder: Secondary | ICD-10-CM | POA: Diagnosis not present

## 2020-10-18 DIAGNOSIS — M25561 Pain in right knee: Secondary | ICD-10-CM

## 2020-10-18 MED ORDER — DICLOFENAC SODIUM 75 MG PO TBEC: 75.0000 mg | DELAYED_RELEASE_TABLET | Freq: Two times a day (BID) | ORAL | 2 refills | Status: DC

## 2020-10-18 NOTE — Progress Notes (Signed)
Chief Complaint  Patient presents with  . Knee Pain    Rt knee locking, giving way, swelling and pain that has gotten worse over the past month.    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 Anxiety   . Arthritis   . Asthma   . BPH (benign prostatic hyperplasia)   . Complication of anesthesia    pt had a hard time being able to move after spinal anesthesia , 3-4 hours  . Depression   . GERD (gastroesophageal reflux disease)   . Gout    no meds  . Headache(784.0)    otc meds prn  . Heart murmur    dx as a child, no problems as an adult  . Hyperlipidemia   . IBS (irritable bowel syndrome)   . PONV (postoperative nausea and vomiting)   . Pre-diabetes    Borderline, diet and exercise, no med  . Sleep apnea    uses CIPAP machine at night  . Wears partial dentures    bottom partial   Past Surgical History:  Procedure Laterality Date  . APPENDECTOMY    . BACK SURGERY     neck and back fusion  . BIOPSY  12/27/2015   Procedure: BIOPSY;  Surgeon: Rogene Houston, MD;  [facility];  Service: Endoscopy;;  Fundus biopsies and duodenal biopsies  . BIOPSY  02/10/2020   Procedure: BIOPSY;  Surgeon: Rogene Houston, MD;  [facility];  Service: Endoscopy;;  antral  . CARDIAC CATHETERIZATION    . CARDIAC CATHETERIZATION N/A 09/04/2016   Procedure: Right/Left Heart Cath and Coronary Angiography;  Surgeon: Peter M Martinique, MD;  Location: [place] [facility];  Service: Cardiovascular;  Laterality: N/A;  . CHOLECYSTECTOMY    . CHONDROPLASTY  08/15/2011   Procedure: CHONDROPLASTY;   Surgeon: [name], MD;  [facility];  Service: Orthopedics;  Laterality: Left;  . COLONOSCOPY  06/27/2011   Procedure: COLONOSCOPY;  Surgeon: Rogene Houston, MD;  [facility];  Service: Endoscopy;  Laterality: N/A;  9:00 / Pt to be here at 9am for 10:45 procedure, benign polyps removed  . COLONOSCOPY N/A 10/05/2014   Procedure: COLONOSCOPY;  Surgeon: Rogene Houston, MD;  [facility];  Service: Endoscopy;  Laterality: N/A;  930  . COLONOSCOPY N/A 02/11/2018   Procedure: COLONOSCOPY;  Surgeon: Rogene Houston, MD;  [facility];  Service: Endoscopy;  Laterality: N/A;  830  . ESOPHAGOGASTRODUODENOSCOPY N/A 12/27/2015   Procedure: ESOPHAGOGASTRODUODENOSCOPY (EGD);  Surgeon: Rogene Houston, MD;  [facility];  Service: Endoscopy;  Laterality: N/A;  3:00  . ESOPHAGOGASTRODUODENOSCOPY (EGD) WITH PROPOFOL N/A 02/10/2020   Procedure: ESOPHAGOGASTRODUODENOSCOPY (EGD) WITH PROPOFOL;  Surgeon: Rogene Houston, MD;  [facility];  Service: Endoscopy;  Laterality: N/A;  155  . HERNIA REPAIR     umbilical hernia  . JOINT REPLACEMENT     knee and shoulder  . KNEE ARTHROSCOPY     left knee  . KNEE ARTHROSCOPY     right knee   .  LUMBAR LAMINECTOMY/DECOMPRESSION MICRODISCECTOMY  09/14/2012   Procedure: LUMBAR LAMINECTOMY/DECOMPRESSION MICRODISCECTOMY 1 LEVEL;  Surgeon: Floyce Stakes, MD;  Location: [place] [facility];  Service: Neurosurgery;  Laterality: Right;  Right Lumbar three-four Diskectomy  . neck fusion    . POLYPECTOMY  02/11/2018   Procedure: POLYPECTOMY;  Surgeon: Rogene Houston, MD;  [facility];  Service: Endoscopy;;  colon  . REVISION TOTAL SHOULDER TO REVERSE TOTAL SHOULDER Left 12/16/2018   Procedure: REVISION TOTAL SHOULDER TO REVERSE TOTAL SHOULDER;  Surgeon: Meredith Pel, MD;  Location: [place];  Service: Orthopedics;  Laterality: Left;  . SHOULDER ARTHROSCOPY WITH BICEPSTENOTOMY Left 09/23/2013   Procedure:  SHOULDER ARTHROSCOPY WITH BICEPSTENOTOMY AND EXTENSIVE DEBRIDEMENT;  Surgeon: Carole Civil, MD;  [facility];  Service: Orthopedics;  Laterality: Left;  . SHOULDER ARTHROSCOPY WITH ROTATOR CUFF REPAIR Left 05/05/2014   Procedure: SHOULDER ARTHROSCOPY LIMITED DEBRIDEMENT;  Surgeon: Carole Civil, MD;  [facility];  Service: Orthopedics;  Laterality: Left;  . SHOULDER OPEN ROTATOR CUFF REPAIR Left 05/05/2014   Procedure: ROTATOR CUFF REPAIR SHOULDER OPEN;  Surgeon: Carole Civil, MD;  [facility];  Service: Orthopedics;  Laterality: Left;  . SHOULDER OPEN ROTATOR CUFF REPAIR Left 12/16/2018   Procedure: LEFT SHOULDER POSSIBLE SUBSCAPULARIS REPAIR VS. REVISION TO REVERSE TOTAL SHOULDER REPLACEMENT;  Surgeon: Meredith Pel, MD;  Location: [place];  Service: Orthopedics;  Laterality: Left;  . SHOULDER SURGERY Right    Open Mumford procedure  . SPINAL FUSION     x 2  . TOTAL KNEE ARTHROPLASTY Left 06/16/2017   Procedure: LEFT TOTAL KNEE ARTHROPLASTY;  Surgeon: Carole Civil, MD;  [facility];  Service: Orthopedics;  Laterality: Left;  . TOTAL SHOULDER ARTHROPLASTY Left 08/19/2018   Procedure: left shoulder replacement;  Surgeon: Meredith Pel, MD;  Location: [place];  Service: Orthopedics;  Laterality: Left;   BP 139/71   Pulse 71   Ht 5\' 6"  (1.676 m)   Wt 217 lb (98.4 kg)   BMI 35.02 kg/m  Physical Exam Constitutional:      General: Christopher [name] is not in acute distress.    Appearance: Christopher [name] is well-developed.     Comments: Well developed, well nourished Normal grooming and hygiene     Cardiovascular:     Comments: No peripheral edema Musculoskeletal:     Comments: Right knee tenderness medial joint line and peripatellar structures no effusion Christopher [name] can bend his knee 125 degrees Christopher [name] has a 2 to 3 degree extension deficit but no extensor lag on muscle testing knee is otherwise stable  Skin:    General: Skin is warm and dry.  Neurological:     Mental Status:  Christopher [name] is alert and oriented to person, place, and time.     Sensory: No sensory deficit.     Coordination: Coordination normal.     Gait: Gait normal.     Deep Tendon Reflexes: Reflexes are normal and symmetric.  Psychiatric:        Mood and Affect: Mood normal.        Behavior: Behavior normal.        Thought Content: Thought content normal.        Judgment: Judgment normal.     Comments: Affect normal     Images show narrowing of the medial compartment with continued valgus alignment no significant spurs at this time  Encounter Diagnoses  Name Primary?  . Chronic pain of right knee Yes  . Chronic right  shoulder pain    Plan  Christopher [name] also asked about a possible anti-inflammatory Christopher [name] is allergic to Celebrex is Relafen allergy is not really an allergy Christopher [name] had some ankle swelling and it was listed as an allergy  Meds ordered this encounter  Medications  . diclofenac (VOLTAREN) 75 MG EC tablet    Sig: Take 1 tablet (75 mg total) by mouth 2 (two) times daily with a meal.    Dispense:  60 tablet    Refill:  2      Procedure note right knee injection   verbal consent was obtained to inject right knee joint  Timeout was completed to confirm the site of injection  The medications used were 40 mg of Depo-Medrol and 1% lidocaine 3 cc  Anesthesia was provided by ethyl chloride and the skin was prepped with alcohol.  After cleaning the skin with alcohol a 20-gauge needle was used to inject the right knee joint. There were no complications. A sterile bandage was applied.    Procedure note the subacromial injection shoulder RIGHT    Verbal consent was obtained to inject the  RIGHT   Shoulder  Timeout was completed to confirm the injection site is a subacromial space of the  RIGHT  shoulder   Medication used Depo-Medrol 40 mg and lidocaine 1% 3 cc  Anesthesia was provided by ethyl chloride  The injection was performed in the RIGHT  posterior subacromial space. After pinning the  skin with alcohol and anesthetized the skin with ethyl chloride the subacromial space was injected using a 20-gauge needle. There were no complications  Sterile dressing was applied.  Meds ordered this encounter  Medications  . diclofenac (VOLTAREN) 75 MG EC tablet    Sig: Take 1 tablet (75 mg total) by mouth 2 (two) times daily with a meal.    Dispense:  60 tablet    Refill:  2

## 2020-10-18 NOTE — Patient Instructions (Addendum)
You have received an injection of steroids into the joint. 15% of patients will have increased pain within the 24 hours postinjection.   This is transient and will go away.   We recommend that you use ice packs on the injection site for 20 minutes every 2 hours and extra strength Tylenol 2 tablets every 8 as needed until the pain resolves.  If you continue to have pain after taking the Tylenol and using the ice please call the office for further instructions.  Start  Meds ordered this encounter  Medications  . diclofenac (VOLTAREN) 75 MG EC tablet    Sig: Take 1 tablet (75 mg total) by mouth 2 (two) times daily with a meal.    Dispense:  60 tablet    Refill:  2

## 2020-10-27 ENCOUNTER — Ambulatory Visit
Admission: RE | Admit: 2020-10-27 | Discharge: 2020-10-27 | Disposition: A | Discharge status: Home / Self Care | Payer: PPO | Source: Ambulatory Visit | Attending: Urology | Admitting: Urology

## 2020-10-27 DIAGNOSIS — N281 Cyst of kidney, acquired: Secondary | ICD-10-CM | POA: Diagnosis not present

## 2020-10-27 DIAGNOSIS — R350 Frequency of micturition: Secondary | ICD-10-CM

## 2020-10-27 MED ORDER — GADOBENATE DIMEGLUMINE 529 MG/ML IV SOLN
20.0000 mL | Freq: Once | INTRAVENOUS | Status: AC | PRN
  Administered 2020-10-27: 20 mL via INTRAVENOUS

## 2020-10-31 ENCOUNTER — Other Ambulatory Visit: Payer: Self-pay | Admitting: Internal Medicine

## 2020-11-01 NOTE — Telephone Encounter (Signed)
This is a [place] pt.  °

## 2020-11-16 ENCOUNTER — Other Ambulatory Visit: Payer: Self-pay

## 2020-11-16 ENCOUNTER — Encounter: Payer: Self-pay | Admitting: Urology

## 2020-11-16 ENCOUNTER — Ambulatory Visit (INDEPENDENT_AMBULATORY_CARE_PROVIDER_SITE_OTHER): Payer: PPO | Admitting: Urology

## 2020-11-16 VITALS — BP 120/72 | HR 61 | Temp 98.2°F | Ht 66.0 in | Wt 224.8 lb

## 2020-11-16 DIAGNOSIS — R1033 Periumbilical pain: Secondary | ICD-10-CM

## 2020-11-16 DIAGNOSIS — N401 Enlarged prostate with lower urinary tract symptoms: Secondary | ICD-10-CM

## 2020-11-16 DIAGNOSIS — R350 Frequency of micturition: Secondary | ICD-10-CM | POA: Diagnosis not present

## 2020-11-16 DIAGNOSIS — N138 Other obstructive and reflux uropathy: Secondary | ICD-10-CM | POA: Diagnosis not present

## 2020-11-16 DIAGNOSIS — N281 Cyst of kidney, acquired: Secondary | ICD-10-CM

## 2020-11-16 LAB — URINALYSIS, ROUTINE W REFLEX MICROSCOPIC
Bilirubin, UA: NEGATIVE
Glucose, UA: NEGATIVE
Ketones, UA: NEGATIVE
Leukocytes,UA: NEGATIVE
Nitrite, UA: NEGATIVE
Protein,UA: NEGATIVE
RBC, UA: NEGATIVE
Specific Gravity, UA: 1.025 (ref 1.005–1.030)
Urobilinogen, Ur: 0.2 mg/dL (ref 0.2–1.0)
pH, UA: 6 (ref 5.0–7.5)

## 2020-11-16 MED ORDER — TAMSULOSIN HCL 0.4 MG PO CAPS: 0.4000 mg | ORAL_CAPSULE | Freq: Every day | ORAL | 11 refills | Status: DC

## 2020-11-16 NOTE — Progress Notes (Signed)
Urological Symptom Review  Patient is experiencing the following symptoms: Frequent urination Get up at night to urinate   Review of Systems  Gastrointestinal (upper)  : Nausea Indigestion/heartburn  Gastrointestinal (lower) : Diarrhea Constipation  Constitutional : Fatigue  Skin: Negative for skin symptoms  Eyes: Negative for eye symptoms  Ear/Nose/Throat : Negative for Ear/Nose/Throat symptoms  Hematologic/Lymphatic: Negative for Hematologic/Lymphatic symptoms  Cardiovascular : Chest pain  Respiratory : Cough Shortness of breath  Endocrine: Excessive thirst  Musculoskeletal: Back pain Joint pain  Neurological: Negative for neurological symptoms  Psychologic: Negative for psychiatric symptoms

## 2020-11-16 NOTE — Patient Instructions (Signed)

## 2020-11-16 NOTE — Progress Notes (Signed)
11/16/2020 9:23 AM   Christopher [name] 30-Jul-1956 [id number]  Referring provider: Leeanne Rio, MD [address] [place],  [place] [address]  followup left renal mass and BPH  HPI: Christopher [name] is a 65yo here for followup for a left renal mass and BPH. He underwent MRI on 10/28/2020 which showed a Bosniak 2 renal cyst. His new complaint today is worsening urinary frequency, urgency and nocturia which has been progressing over the past 2 years. He has urinary frequency every 1-2 hours, nocturia 2-3x, urinary urgency numerous times per day. Occasional urge incontinence. He is unhappy with his urination.    PMH: Past Medical History:  Diagnosis Date  . Anxiety   . Arthritis   . Asthma   . BPH (benign prostatic hyperplasia)   . Complication of anesthesia    pt had a hard time being able to move after spinal anesthesia , 3-4 hours  . Depression   . GERD (gastroesophageal reflux disease)   . Gout    no meds  . Headache(784.0)    otc meds prn  . Heart murmur    dx as a child, no problems as an adult  . Hyperlipidemia   . IBS (irritable bowel syndrome)   . PONV (postoperative nausea and vomiting)   . Pre-diabetes    Borderline, diet and exercise, no med  . Sleep apnea    uses CIPAP machine at night  . Wears partial dentures    bottom partial    Surgical History: Past Surgical History:  Procedure Laterality Date  . APPENDECTOMY    . BACK SURGERY     neck and back fusion  . BIOPSY  12/27/2015   Procedure: BIOPSY;  Surgeon: Rogene Houston, MD;  [facility];  Service: Endoscopy;;  Fundus biopsies and duodenal biopsies  . BIOPSY  02/10/2020   Procedure: BIOPSY;  Surgeon: Rogene Houston, MD;  [facility];  Service: Endoscopy;;  antral  . CARDIAC CATHETERIZATION    . CARDIAC CATHETERIZATION N/A 09/04/2016   Procedure: Right/Left Heart Cath and Coronary Angiography;  Surgeon: Peter M Martinique, MD;  Location: [facility];  Service: Cardiovascular;   Laterality: N/A;  . CHOLECYSTECTOMY    . CHONDROPLASTY  08/15/2011   Procedure: CHONDROPLASTY;  Surgeon: [name], MD;  Location: [facility];  Service: Orthopedics;  Laterality: Left;  . COLONOSCOPY  06/27/2011   Procedure: COLONOSCOPY;  Surgeon: Rogene Houston, MD;  [facility];  Service: Endoscopy;  Laterality: N/A;  9:00 / Pt to be here at 9am for 10:45 procedure, benign polyps removed  . COLONOSCOPY N/A 10/05/2014   Procedure: COLONOSCOPY;  Surgeon: Rogene Houston, MD;  [facility];  Service: Endoscopy;  Laterality: N/A;  930  . COLONOSCOPY N/A 02/11/2018   Procedure: COLONOSCOPY;  Surgeon: Rogene Houston, MD;  [facility];  Service: Endoscopy;  Laterality: N/A;  830  . ESOPHAGOGASTRODUODENOSCOPY N/A 12/27/2015   Procedure: ESOPHAGOGASTRODUODENOSCOPY (EGD);  Surgeon: Rogene Houston, MD;  [facility];  Service: Endoscopy;  Laterality: N/A;  3:00  . ESOPHAGOGASTRODUODENOSCOPY (EGD) WITH PROPOFOL N/A 02/10/2020   Procedure: ESOPHAGOGASTRODUODENOSCOPY (EGD) WITH PROPOFOL;  Surgeon: Rogene Houston, MD;  [facility];  Service: Endoscopy;  Laterality: N/A;  155  . HERNIA REPAIR     umbilical hernia  . JOINT REPLACEMENT     knee and shoulder  . KNEE ARTHROSCOPY     left knee  .  KNEE ARTHROSCOPY     right knee   . LUMBAR LAMINECTOMY/DECOMPRESSION MICRODISCECTOMY  09/14/2012   Procedure: LUMBAR LAMINECTOMY/DECOMPRESSION MICRODISCECTOMY 1 LEVEL;  Surgeon: Floyce Stakes, MD;  Location: [facility];  Service: Neurosurgery;  Laterality: Right;  Right Lumbar three-four Diskectomy  . neck fusion    . POLYPECTOMY  02/11/2018   Procedure: POLYPECTOMY;  Surgeon: Rogene Houston, MD;  [facility];  Service: Endoscopy;;  colon  . REVISION TOTAL SHOULDER TO REVERSE TOTAL SHOULDER Left 12/16/2018   Procedure: REVISION TOTAL SHOULDER TO REVERSE TOTAL SHOULDER;  Surgeon: Meredith Pel, MD;  Location: [facility];  Service:  Orthopedics;  Laterality: Left;  . SHOULDER ARTHROSCOPY WITH BICEPSTENOTOMY Left 09/23/2013   Procedure: SHOULDER ARTHROSCOPY WITH BICEPSTENOTOMY AND EXTENSIVE DEBRIDEMENT;  Surgeon: Carole Civil, MD;  [facility];  Service: Orthopedics;  Laterality: Left;  . SHOULDER ARTHROSCOPY WITH ROTATOR CUFF REPAIR Left 05/05/2014   Procedure: SHOULDER ARTHROSCOPY LIMITED DEBRIDEMENT;  Surgeon: Carole Civil, MD;  [facility];  Service: Orthopedics;  Laterality: Left;  . SHOULDER OPEN ROTATOR CUFF REPAIR Left 05/05/2014   Procedure: ROTATOR CUFF REPAIR SHOULDER OPEN;  Surgeon: Carole Civil, MD;  [facility];  Service: Orthopedics;  Laterality: Left;  . SHOULDER OPEN ROTATOR CUFF REPAIR Left 12/16/2018   Procedure: LEFT SHOULDER POSSIBLE SUBSCAPULARIS REPAIR VS. REVISION TO REVERSE TOTAL SHOULDER REPLACEMENT;  Surgeon: Meredith Pel, MD;  Location: [facility];  Service: Orthopedics;  Laterality: Left;  . SHOULDER SURGERY Right    Open Mumford procedure  . SPINAL FUSION     x 2  . TOTAL KNEE ARTHROPLASTY Left 06/16/2017   Procedure: LEFT TOTAL KNEE ARTHROPLASTY;  Surgeon: Carole Civil, MD;  [facility];  Service: Orthopedics;  Laterality: Left;  . TOTAL SHOULDER ARTHROPLASTY Left 08/19/2018   Procedure: left shoulder replacement;  Surgeon: Meredith Pel, MD;  Location: [facility];  Service: Orthopedics;  Laterality: Left;    Home Medications:  Allergies as of 11/16/2020      Reactions   Celebrex [celecoxib] Itching, Swelling, Other (See Comments)   All over   Cortisone Swelling   SWELLING REACTION UNSPECIFIED    Doxycycline Swelling, Other (See Comments)   Made tongue turn black    Prednisone Swelling   SWELLING REACTION UNSPECIFIED    Relafen [nabumetone] Swelling   SWELLING REACTION UNSPECIFIED    Codeine Nausea And Vomiting, Other (See Comments)   Extreme stomach pain. This includes anything with the derivative of codeine in it.   Oxycodone-acetaminophen  Itching, Other (See Comments)   Can not  tolerate with benadryl       Medication List       Accurate as of November 16, 2020  9:23 AM. If you have any questions, ask your nurse or doctor.        alfuzosin 10 MG 24 hr tablet Commonly known as: UROXATRAL Take 1 tablet (10 mg total) by mouth at bedtime.   cyclobenzaprine 5 MG tablet Commonly known as: FLEXERIL   diclofenac 75 MG EC tablet Commonly known as: VOLTAREN Take 1 tablet (75 mg total) by mouth 2 (two) times daily with a meal.   dicyclomine 10 MG capsule Commonly known as: BENTYL TAKE 1 CAPSULE THREE TIMES DAILY BEFORE MEALS What changed: See the new instructions.   ICY HOT EX Apply 1 application topically daily as needed (pain). Per patient he will use as needed.   nitroGLYCERIN 0.4 MG SL tablet Commonly known as: NITROSTAT  PLACE (1) TABLET UNDER TONGUE EVERY 5 MINUTES UP TO (3) DOSES. IF NO RELIEF CALL 911.   pantoprazole 40 MG tablet Commonly known as: PROTONIX TAKE (1) TABLET TWICE A DAY BEFORE MEALS.   ProAir HFA 108 (90 Base) MCG/ACT inhaler Generic drug: albuterol Inhale 2 puffs into the lungs every 6 (six) hours as needed for wheezing or shortness of breath.   rosuvastatin 10 MG tablet Commonly known as: CRESTOR Take 10 mg by mouth at bedtime.   sertraline 50 MG tablet Commonly known as: ZOLOFT Take 50 mg by mouth daily.   tamsulosin 0.4 MG Caps capsule Commonly known as: FLOMAX Take 1 capsule (0.4 mg total) by mouth daily after supper. Started by: Nicolette Bang, MD   temazepam 15 MG capsule Commonly known as: RESTORIL Take 15 mg by mouth at bedtime.   temazepam 7.5 MG capsule Commonly known as: RESTORIL Take 7.5 mg by mouth at bedtime.       Allergies:  Allergies  Allergen Reactions  . Celebrex [Celecoxib] Itching, Swelling and Other (See Comments)    All over  . Cortisone Swelling    SWELLING REACTION UNSPECIFIED   . Doxycycline Swelling and Other (See Comments)    Made tongue  turn black   . Prednisone Swelling    SWELLING REACTION UNSPECIFIED   . Relafen [Nabumetone] Swelling    SWELLING REACTION UNSPECIFIED   . Codeine Nausea And Vomiting and Other (See Comments)    Extreme stomach pain. This includes anything with the derivative of codeine in it.  . Oxycodone-Acetaminophen Itching and Other (See Comments)    Can not  tolerate with benadryl     Family History: Family History  Problem Relation Age of Onset  . Diabetes Other   . Lung disease Other   . Arthritis Other   . Anesthesia problems Neg Hx   . Hypotension Neg Hx   . Malignant hyperthermia Neg Hx   . Pseudochol deficiency Neg Hx     Social History:  reports that he has been smoking cigarettes. He has a 44.00 pack-year smoking history. He has never used smokeless tobacco. He reports that he does not drink alcohol and does not use drugs.  ROS: All other review of systems were reviewed and are negative except what is noted above in HPI  Physical Exam: BP 120/72   Pulse 61   Temp 98.2 F (36.8 C)   Ht 5\' 6"  (1.676 m)   Wt 224 lb 12.8 oz (102 kg)   BMI 36.28 kg/m   Constitutional:  Alert and oriented, No acute distress. HEENT: [place] AT, moist mucus membranes.  Trachea midline, no masses. Cardiovascular: No clubbing, cyanosis, or edema. Respiratory: Normal respiratory effort, no increased work of breathing. GI: Abdomen is soft, nontender, nondistended, no abdominal masses GU: No CVA tenderness.  Lymph: No cervical or inguinal lymphadenopathy. Skin: No rashes, bruises or suspicious lesions. Neurologic: Grossly intact, no focal deficits, moving all 4 extremities. Psychiatric: Normal mood and affect.  Laboratory Data: Lab Results  Component Value Date   WBC 9.5 02/02/2020   HGB 14.7 02/02/2020   HCT 43.6 02/02/2020   MCV 83.4 02/02/2020   PLT 241 02/02/2020    Lab Results  Component Value Date   CREATININE 0.90 03/13/2020    No results found for: PSA  No results found for:  TESTOSTERONE  Lab Results  Component Value Date   HGBA1C 5.6 06/11/2017    Urinalysis    Component Value Date/Time   COLORURINE YELLOW 12/16/2018  [place] 10/03/2020 1140   LABSPEC 1.015 12/16/2018 1020   PHURINE 6.0 12/16/2018 1020   GLUCOSEU Negative 10/03/2020 1140   HGBUR NEGATIVE 12/16/2018 1020   BILIRUBINUR Negative 10/03/2020 1140   KETONESUR NEGATIVE 12/16/2018 1020   PROTEINUR Negative 10/03/2020 1140   PROTEINUR NEGATIVE 12/16/2018 1020   NITRITE Negative 10/03/2020 1140   NITRITE NEGATIVE 12/16/2018 1020   LEUKOCYTESUR Negative 10/03/2020 1140   LEUKOCYTESUR NEGATIVE 12/16/2018 1020    Lab Results  Component Value Date   LABMICR See below: 10/03/2020   WBCUA None seen 10/03/2020   LABEPIT None seen 10/03/2020   BACTERIA None seen 10/03/2020    Pertinent Imaging: MRI 10/27/2020: Images reviewed and discussed with the patient Results for orders placed during the hospital encounter of 02/19/10  DG Abd 1 View  Narrative Clinical Data: Vomiting and abdominal pain.  Status post lumbar spine surgery 2 days ago.  ABDOMEN - 1 VIEW  Comparison: None.  Findings: Mild gaseous distention of the colon with scattered air fluid levels, including the rectum.  Surgical clips in the right upper and lower abdomen.  Laminectomy defects, interbody spacer and pedicle screw and rod fixation at the L4-5 level.  Minimal dextroconvex lumbar scoliosis.  IMPRESSION: Mild colonic ileus.  Provider: Rober Minion  No results found for this or any previous visit.  No results found for this or any previous visit.  No results found for this or any previous visit.  No results found for this or any previous visit.  No results found for this or any previous visit.  No results found for this or any previous visit.  No results found for this or any previous visit.   Assessment & Plan:    1. Benign prostatic hyperplasia with urinary obstruction -we will  trial flomax 0.4mg  daily. RTC 3 months - Urinalysis, Routine w reflex microscopic  2. Renal cyst -MRi revealed benign renal lesion. No further workup needed   No follow-ups on file.  Nicolette Bang, MD  [facility] [employer]

## 2020-12-13 ENCOUNTER — Ambulatory Visit: Payer: PPO | Admitting: General Surgery

## 2021-01-17 ENCOUNTER — Other Ambulatory Visit: Payer: Self-pay

## 2021-01-17 ENCOUNTER — Ambulatory Visit (INDEPENDENT_AMBULATORY_CARE_PROVIDER_SITE_OTHER): Payer: PPO | Admitting: Orthopedic Surgery

## 2021-01-17 ENCOUNTER — Encounter: Payer: Self-pay | Admitting: Orthopedic Surgery

## 2021-01-17 VITALS — BP 156/84 | HR 76 | Ht 66.0 in | Wt 220.0 lb

## 2021-01-17 DIAGNOSIS — M25561 Pain in right knee: Secondary | ICD-10-CM

## 2021-01-17 DIAGNOSIS — M25461 Effusion, right knee: Secondary | ICD-10-CM

## 2021-01-17 DIAGNOSIS — G8929 Other chronic pain: Secondary | ICD-10-CM

## 2021-01-17 NOTE — Progress Notes (Signed)
Chief Complaint  Patient presents with  . Knee Pain    Right/ wants injection states also wants to know "what's wrong with it"   Christopher [name] is 65 years old he had chronic pain and swelling of his right knee which is gotten worse after a started cutting grass this summer and spring  He requests an injection in his right knee  However his right knee is subjectively swollen and we aspirated the knee as well  The patient gave consent for aspiration injection of the right knee  We used an 18-gauge needle superolateral approach with the knee in extension obtained 55 cc of clear yellow fluid and then injected 6 mg of Celestone and 1% lidocaine 2 cc no complications were noted we used ethyl chloride and alcohol to prepare the skin  Follow-up as needed  Encounter Diagnosis  Name Primary?  . Effusion, right knee Yes

## 2021-01-23 ENCOUNTER — Telehealth: Payer: Self-pay | Admitting: Orthopedic Surgery

## 2021-01-23 NOTE — Telephone Encounter (Signed)
Patient/wife Christopher [name], designated contact on file, relays that patient's right knee is not much better since his office visit here 01/17/21. States has done what was recommended, icing, elevating, and still having the pain and some swelling. I offered next available, however, no openings found until 01/31/21 or after.  Please advise.

## 2021-01-24 ENCOUNTER — Ambulatory Visit: Payer: PPO | Admitting: Orthopedic Surgery

## 2021-01-24 NOTE — Telephone Encounter (Signed)
Too soon    Stay off the leg for 48 hrs   Apply ice for 30 min 6 x a day   Take the following medication   Steroid dose pack

## 2021-01-24 NOTE — Telephone Encounter (Signed)
I called wife we discussed She voiced understanding

## 2021-01-26 ENCOUNTER — Other Ambulatory Visit: Payer: Self-pay | Admitting: Orthopedic Surgery

## 2021-01-26 ENCOUNTER — Telehealth: Payer: Self-pay | Admitting: Orthopedic Surgery

## 2021-01-26 MED ORDER — PREDNISONE 10 MG (48) PO TBPK: ORAL_TABLET | Freq: Every day | ORAL | 0 refills | Status: DC

## 2021-01-26 MED ORDER — HYDROCODONE-ACETAMINOPHEN 10-325 MG PO TABS: 1.0000 | ORAL_TABLET | Freq: Four times a day (QID) | ORAL | 0 refills | Status: DC | PRN

## 2021-01-26 NOTE — Telephone Encounter (Signed)
norco 10   Prednisone 10

## 2021-01-26 NOTE — Progress Notes (Signed)
Meds ordered this encounter  Medications  . predniSONE (STERAPRED UNI-PAK 48 TAB) 10 MG (48) TBPK tablet    Sig: Take by mouth daily.    Dispense:  48 tablet    Refill:  0  . HYDROcodone-acetaminophen (NORCO) 10-325 MG tablet    Sig: Take 1 tablet by mouth every 6 (six) hours as needed.    Dispense:  30 tablet    Refill:  0

## 2021-01-29 NOTE — Telephone Encounter (Signed)
Patient confirmed receipt of 2 medications as noted per Dr Aline Brochure with patient 01/26/21.

## 2021-02-07 ENCOUNTER — Encounter: Payer: Self-pay | Admitting: Orthopedic Surgery

## 2021-02-07 ENCOUNTER — Other Ambulatory Visit: Payer: Self-pay

## 2021-02-07 ENCOUNTER — Ambulatory Visit: Payer: PPO | Admitting: Orthopedic Surgery

## 2021-02-07 VITALS — BP 144/72 | HR 63 | Ht 66.0 in | Wt 221.0 lb

## 2021-02-07 DIAGNOSIS — G8929 Other chronic pain: Secondary | ICD-10-CM

## 2021-02-07 DIAGNOSIS — M25561 Pain in right knee: Secondary | ICD-10-CM | POA: Diagnosis not present

## 2021-02-07 NOTE — Progress Notes (Signed)
Follow up   Chief Complaint  Patient presents with   Knee Pain    Right/ gave out 3 days ago has pain in evenings and afternoon, not much better    Mr. Christopher [name] started having pain in his knee back in January he presented them with catching locking giving way and swelling.  He complains of tightness at the end of the day and difficulty bending and squatting  Since that time he has had 2 injections, prednisone and hydrocodone.  He cannot take NSAIDs because of GI issues  He still has pain swelling giving way and now has some radiating pain down the right side of his leg  Focused exam right knee  He has a severe limp favoring his right leg Large joint effusion Cannot fully flex the knee due to tightness Tender on both sides of the joint but primarily over the lateral joint line  Recommend MRI right knee for chronic pain  Differential diagnosis lateral meniscus tear versus stress fracture versus avascular necrosis  Prior images:  CT lumbar December 2021 L1-L2: Minimal broad-based disc bulge. Mild bilateral facet arthropathy. No foraminal stenosis.   L2-L3: Broad-based disc bulge with a right paracentral disc protrusion. Moderate bilateral facet arthropathy. Mild-moderate spinal stenosis. No foraminal stenosis.   L3-L4: Interbody fusion.  No foraminal or central canal stenosis.   L4-L5: Interbody fusion.  No foraminal or central canal stenosis.   L5-S1: Interbody fusion.  No foraminal or central canal stenosis.   IMPRESSION: 1. No acute osseous injury of the lumbar spine. 2. At L2-3 there is a broad-based disc bulge with a right paracentral disc protrusion. Moderate bilateral facet arthropathy. Mild-moderate spinal stenosis. 3. Posterior lumbar interbody fusion from L3 through S1 without hardware failure or complication. 4. Lumbar spine spondylosis as described above. 5. Aortic Atherosclerosis (ICD10-I70.0).     Electronically Signed   By: Kathreen Devoid   On: 08/23/2020  10:26   Mri rt knee chronic pain meniscus v stress frx vs avn vs lumbar referred pain

## 2021-02-07 NOTE — Patient Instructions (Addendum)
Ice several times a day   Stay off the knee as much as possible   Call to schedule the MRI [phone] tell them OPEN scan

## 2021-02-15 ENCOUNTER — Ambulatory Visit: Payer: PPO | Admitting: Urology

## 2021-02-16 ENCOUNTER — Ambulatory Visit
Admission: RE | Admit: 2021-02-16 | Discharge: 2021-02-16 | Disposition: A | Discharge status: Home / Self Care | Payer: PPO | Source: Ambulatory Visit | Attending: Orthopedic Surgery | Admitting: Orthopedic Surgery

## 2021-02-16 ENCOUNTER — Other Ambulatory Visit: Payer: Self-pay

## 2021-02-16 DIAGNOSIS — G8929 Other chronic pain: Secondary | ICD-10-CM

## 2021-02-16 DIAGNOSIS — M25461 Effusion, right knee: Secondary | ICD-10-CM | POA: Diagnosis not present

## 2021-02-16 DIAGNOSIS — M23221 Derangement of posterior horn of medial meniscus due to old tear or injury, right knee: Secondary | ICD-10-CM | POA: Diagnosis not present

## 2021-02-16 DIAGNOSIS — R600 Localized edema: Secondary | ICD-10-CM | POA: Diagnosis not present

## 2021-02-16 DIAGNOSIS — M1711 Unilateral primary osteoarthritis, right knee: Secondary | ICD-10-CM | POA: Diagnosis not present

## 2021-02-18 DIAGNOSIS — H9209 Otalgia, unspecified ear: Secondary | ICD-10-CM | POA: Diagnosis not present

## 2021-02-18 DIAGNOSIS — Z23 Encounter for immunization: Secondary | ICD-10-CM | POA: Diagnosis not present

## 2021-02-18 DIAGNOSIS — G4733 Obstructive sleep apnea (adult) (pediatric): Secondary | ICD-10-CM | POA: Diagnosis not present

## 2021-02-20 ENCOUNTER — Ambulatory Visit: Payer: PPO | Admitting: Urology

## 2021-02-20 ENCOUNTER — Ambulatory Visit: Payer: PPO | Admitting: Orthopedic Surgery

## 2021-02-20 ENCOUNTER — Encounter: Payer: Self-pay | Admitting: Orthopedic Surgery

## 2021-02-20 ENCOUNTER — Other Ambulatory Visit: Payer: Self-pay

## 2021-02-20 VITALS — BP 145/76 | HR 63 | Ht 66.0 in | Wt 221.2 lb

## 2021-02-20 DIAGNOSIS — M25461 Effusion, right knee: Secondary | ICD-10-CM

## 2021-02-20 DIAGNOSIS — M1711 Unilateral primary osteoarthritis, right knee: Secondary | ICD-10-CM | POA: Diagnosis not present

## 2021-02-20 DIAGNOSIS — M1710 Unilateral primary osteoarthritis, unspecified knee: Secondary | ICD-10-CM

## 2021-02-20 DIAGNOSIS — M25561 Pain in right knee: Secondary | ICD-10-CM

## 2021-02-20 DIAGNOSIS — G8929 Other chronic pain: Secondary | ICD-10-CM | POA: Diagnosis not present

## 2021-02-20 DIAGNOSIS — M84451D Pathological fracture, right femur, subsequent encounter for fracture with routine healing: Secondary | ICD-10-CM | POA: Diagnosis not present

## 2021-02-20 NOTE — Patient Instructions (Signed)
TAKE IT EASY WEAR BRACE FOR ACTIVITY

## 2021-02-20 NOTE — Progress Notes (Signed)
Encounter Diagnoses  Name Primary?   Chronic pain of right knee Yes   Effusion, right knee    Primary localized osteoarthritis of knee    Subchondral insufficiency fracture of condyle of right femur with routine healing, subsequent encounter     Chief Complaint  Patient presents with   Results    MRI RT knee    I read the MRI he has several findings he has full-thickness cartilage loss medial femur and medial tibia with extruded torn meniscus may have had previous surgery there.  Osteophytes are also noted and subchondral fracture femur and tibia  MRI report noted below  Recommend playmaker brace for 6 weeks and reevaluate for possible knee replacement surgery  CLINICAL DATA:  Knee pain, chronic, negative xray (Age >= 5y)   EXAM: MRI OF THE RIGHT KNEE WITHOUT CONTRAST   TECHNIQUE: Multiplanar, multisequence MR imaging of the knee was performed. No intravenous contrast was administered.   COMPARISON:  Right knee radiograph 10/18/2020   FINDINGS: MENISCI   Medial: There is severe degeneration of the medial meniscal body with substance loss. There is blunting of the free edge and likely horizontal tearing of the posterior horn.   Lateral: Intact.   LIGAMENTS   Cruciates: ACL and PCL are intact.   Collaterals: Thickening of the proximal MCL. Lateral collateral ligament complex is intact.   CARTILAGE   Patellofemoral: High-grade chondral thinning of the most inferior portion of the patellar cartilage. Overall mild trochlear chondrosis though with focal fissuring in the medial trochlea.   Medial: Severe chondrosis of the medial compartment with broad area of full-thickness cartilage loss measuring 1.7 x 2.0 cm (coronal T2 image 14, sagittal T2 image 11). There is full-thickness cartilage loss along the tibial plateau as well.   Lateral:  Mild chondrosis without focal defect.   JOINT: No joint effusion.   POPLITEAL FOSSA: Large joint effusion with synovitis.    EXTENSOR MECHANISM: Intact quadriceps tendon. Intact patellar tendon. Intact lateral patellar retinaculum. Intact medial patellar retinaculum. Intact MPFL.   BONES: Bony edema in the medial femoral condyle. Possible developing subchondral fracture of the medial tibial plateau with adjacent bony edema in overlying area of full-thickness cartilage loss.   Other: There is intramuscular and perimyofascial edema along the distal vastus intermedius and IT band (axial T2 image 1-4).   IMPRESSION: Medial predominant osteoarthritis of the right knee.   Severe degeneration and substance loss of the medial meniscal body, with free edge blunting and likely horizontal degenerative tearing of the posterior horn.   Severe medial compartment chondrosis with broad full-thickness cartilage loss along the weight-bearing medial femoral condyle and tibial plateau. Likely small developing subchondral fracture in the medial tibial plateau.   Thickening of the proximal MCL compatible with chronic sprain.   High-grade chondral thinning of the most inferior portion of the patellar cartilage. Overall mild trochlear chondrosis though with focal fissuring of the medial trochlea.   Intramuscular and perimyofascial edema along the distal vastus intermedius and IT band, could represent a low-grade myofascial injury.   Large joint effusion with synovitis.     Electronically Signed   By: Maurine Simmering   On: 02/18/2021 08:48

## 2021-02-21 ENCOUNTER — Telehealth: Payer: Self-pay | Admitting: Orthopedic Surgery

## 2021-02-21 NOTE — Telephone Encounter (Signed)
No its not I will call him when I have a medium playmaker available, hopefully today

## 2021-02-21 NOTE — Telephone Encounter (Signed)
Patient's wife came by because he didn't stop by to get his appointment scheduled after being seen yesterday.  Appointment was given but then she said they had questions regarding the brace for him.  They aren't sure if the brace that he is wearing is the only one Dr Aline Brochure wanted him to have.  Please call them and discuss their concerns  Thanks

## 2021-02-25 NOTE — Telephone Encounter (Signed)
Left message his brace is on my desk asked him to le me know when he can come in to get it

## 2021-02-27 ENCOUNTER — Other Ambulatory Visit: Payer: Self-pay | Admitting: Orthopedic Surgery

## 2021-02-27 MED ORDER — HYDROCODONE-ACETAMINOPHEN 10-325 MG PO TABS: 1.0000 | ORAL_TABLET | Freq: Four times a day (QID) | ORAL | 0 refills | Status: DC | PRN

## 2021-02-27 NOTE — Telephone Encounter (Signed)
Patient requests refill: HYDROcodone-acetaminophen (NORCO) 10-325 MG tablet 30 tablet   -[place] for medication to be Delivered.

## 2021-04-01 ENCOUNTER — Other Ambulatory Visit: Payer: Self-pay

## 2021-04-01 ENCOUNTER — Ambulatory Visit: Payer: PPO | Admitting: Neurology

## 2021-04-01 ENCOUNTER — Encounter: Payer: Self-pay | Admitting: Neurology

## 2021-04-01 VITALS — BP 142/83 | HR 68 | Ht 66.0 in | Wt 223.0 lb

## 2021-04-01 DIAGNOSIS — R0683 Snoring: Secondary | ICD-10-CM | POA: Diagnosis not present

## 2021-04-01 DIAGNOSIS — F17200 Nicotine dependence, unspecified, uncomplicated: Secondary | ICD-10-CM

## 2021-04-01 DIAGNOSIS — E669 Obesity, unspecified: Secondary | ICD-10-CM

## 2021-04-01 DIAGNOSIS — G4733 Obstructive sleep apnea (adult) (pediatric): Secondary | ICD-10-CM | POA: Diagnosis not present

## 2021-04-01 DIAGNOSIS — J449 Chronic obstructive pulmonary disease, unspecified: Secondary | ICD-10-CM

## 2021-04-01 DIAGNOSIS — R0681 Apnea, not elsewhere classified: Secondary | ICD-10-CM

## 2021-04-01 NOTE — Progress Notes (Signed)
Subjective:    [name] ID: Christopher [name] is a 65 y.o. male.  HPI    Star Age, MD, PhD [facility] [address] [place], [place] [address]  Dear Dr. Huel Cote,   I saw your [name], Christopher [name], upon your kind request, in my sleep clinic today for initial consultation of his sleep disorder, in particular, evaluation of his prior diagnosis of obstructive sleep apnea.  Christopher [name] is accompanied by his wife today.  As you know, Christopher [name] is a 65 year old right-handed gentleman with an underlying medical history of COPD, smoking, arthritis, reflux disease, gout, prediabetes, hyperlipidemia, irritable bowel syndrome, anxiety, depression, and obesity, who was previously diagnosed with obstructive sleep apnea and placed on CPAP therapy.  Christopher [name] has not been using his CPAP machine due to lack of supplies.  His sleep testing was approximately 12 to 15 years ago and prior sleep study results are not available for my review today.  I reviewed your office note from 02/18/2021.  His Epworth sleepiness score is 9 out of 24, fatigue severity score is 62 out of 63.  Christopher [name] has not used his CPAP machine in about 1 month or longer.  Christopher [name] does not sleep well without it.  Christopher [name] has an old machine and did not bring it today so compliance data is not available.  Christopher [name] reports that his testing was at [facility] some 15 years ago.  As far as Christopher severity of his sleep apnea, his wife recalls that Christopher [name] stopped breathing about 70 times per hour.  Christopher [name] believes his pressure has been at 20.  Christopher [name] uses a fullface mask.  Of note, Christopher [name] takes chronic narcotic pain medication for his knee pain.  Christopher [name] is supposed to have knee replacement surgery on Christopher right, does not have an appointment set up yet.  Christopher [name] had a bone fragment that needed to heal first, Christopher [name] is wearing a brace. Christopher [name] smokes about a pack per day.  Christopher [name] has not been able to quit, had a reaction to Chantix per wife.  Christopher [name] does not drink any alcohol.   Christopher [name] drinks caffeine in Christopher form of coffee, 2 cups in Christopher morning, several servings of soda and tea per day, hardly any water by self-report.  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  Christopher [name] now works in Scientist, research (physical sciences).  His snoring is loud and disturbing to his wife.  Christopher [name] lives with his wife, they have a small dog in Christopher household.  Christopher [name] has a grown son.  She has noticed apneic pauses while Christopher [name] is asleep and loud gasping sounds.  His Past Medical History Is Significant For: Past Medical History:  Diagnosis Date   Anxiety    Arthritis    Asthma    BPH (benign prostatic hyperplasia)    Complication of anesthesia    pt had a hard time being able to move after spinal anesthesia , 3-4 hours   Depression    GERD (gastroesophageal reflux disease)    Gout    no meds   Headache(784.0)    otc meds prn   Heart murmur    dx as a child, no problems as an adult   Hyperlipidemia    IBS (irritable bowel syndrome)    PONV (postoperative nausea and vomiting)    Pre-diabetes    Borderline, diet and exercise, no med   Sleep apnea    uses CIPAP machine at night   Wears partial  dentures    bottom partial    His Past Surgical History Is Significant For: Past Surgical History:  Procedure Laterality Date   APPENDECTOMY     BACK SURGERY  2002   neck and back fusion   BIOPSY  12/27/2015   Procedure: BIOPSY;  Surgeon: Rogene Houston, MD;  [facility];  Service: Endoscopy;;  Fundus biopsies and duodenal biopsies   BIOPSY  02/10/2020   Procedure: BIOPSY;  Surgeon: Rogene Houston, MD;  [facility];  Service: Endoscopy;;  antral   CARDIAC CATHETERIZATION     CARDIAC CATHETERIZATION N/A 09/04/2016   Procedure: Right/Left Heart Cath and Coronary Angiography;  Surgeon: Peter M Martinique, MD;  Location: [place] [facility];  Service: Cardiovascular;  Laterality: N/A;   CHOLECYSTECTOMY     CHONDROPLASTY  08/15/2011   Procedure: CHONDROPLASTY;  Surgeon: [name], MD;   Location: [facility];  Service: Orthopedics;  Laterality: Left;   COLONOSCOPY  06/27/2011   Procedure: COLONOSCOPY;  Surgeon: Rogene Houston, MD;  [facility];  Service: Endoscopy;  Laterality: N/A;  9:00 / Pt to be here at 9am for 10:45 procedure, benign polyps removed   COLONOSCOPY N/A 10/05/2014   Procedure: COLONOSCOPY;  Surgeon: Rogene Houston, MD;  [facility];  Service: Endoscopy;  Laterality: N/A;  930   COLONOSCOPY N/A 02/11/2018   Procedure: COLONOSCOPY;  Surgeon: Rogene Houston, MD;  [facility];  Service: Endoscopy;  Laterality: N/A;  830   ESOPHAGOGASTRODUODENOSCOPY N/A 12/27/2015   Procedure: ESOPHAGOGASTRODUODENOSCOPY (EGD);  Surgeon: Rogene Houston, MD;  [facility];  Service: Endoscopy;  Laterality: N/A;  3:00   ESOPHAGOGASTRODUODENOSCOPY (EGD) WITH PROPOFOL N/A 02/10/2020   Procedure: ESOPHAGOGASTRODUODENOSCOPY (EGD) WITH PROPOFOL;  Surgeon: Rogene Houston, MD;  [facility];  Service: Endoscopy;  Laterality: N/A;  155   HERNIA REPAIR     umbilical hernia   JOINT REPLACEMENT     knee and shoulder   KNEE ARTHROSCOPY     left knee   KNEE ARTHROSCOPY     right knee    LUMBAR LAMINECTOMY/DECOMPRESSION MICRODISCECTOMY  09/14/2012   Procedure: LUMBAR LAMINECTOMY/DECOMPRESSION MICRODISCECTOMY 1 LEVEL;  Surgeon: Floyce Stakes, MD;  Location: [place] [facility];  Service: Neurosurgery;  Laterality: Right;  Right Lumbar three-four Diskectomy   neck fusion  2005   POLYPECTOMY  02/11/2018   Procedure: POLYPECTOMY;  Surgeon: Rogene Houston, MD;  [facility];  Service: Endoscopy;;  colon   REVISION TOTAL SHOULDER TO REVERSE TOTAL SHOULDER Left 12/16/2018   Procedure: REVISION TOTAL SHOULDER TO REVERSE TOTAL SHOULDER;  Surgeon: Meredith Pel, MD;  Location: [place];  Service: Orthopedics;  Laterality: Left;   SHOULDER ARTHROSCOPY WITH BICEPSTENOTOMY Left 09/23/2013   Procedure: SHOULDER ARTHROSCOPY WITH  BICEPSTENOTOMY AND EXTENSIVE DEBRIDEMENT;  Surgeon: Carole Civil, MD;  [facility];  Service: Orthopedics;  Laterality: Left;   SHOULDER ARTHROSCOPY WITH ROTATOR CUFF REPAIR Left 05/05/2014   Procedure: SHOULDER ARTHROSCOPY LIMITED DEBRIDEMENT;  Surgeon: Carole Civil, MD;  [facility];  Service: Orthopedics;  Laterality: Left;   SHOULDER OPEN ROTATOR CUFF REPAIR Left 05/05/2014   Procedure: ROTATOR CUFF REPAIR SHOULDER OPEN;  Surgeon: Carole Civil, MD;  [facility];  Service: Orthopedics;  Laterality: Left;   SHOULDER OPEN ROTATOR CUFF REPAIR Left 12/16/2018   Procedure: LEFT SHOULDER POSSIBLE SUBSCAPULARIS REPAIR VS. REVISION TO REVERSE TOTAL SHOULDER REPLACEMENT;  Surgeon: Meredith Pel,  MD;  Location: [place];  Service: Orthopedics;  Laterality: Left;   SHOULDER SURGERY Right    Open Mumford procedure   SPINAL FUSION     x2, 2003 and 2007   TOTAL KNEE ARTHROPLASTY Left 06/16/2017   Procedure: LEFT TOTAL KNEE ARTHROPLASTY;  Surgeon: Carole Civil, MD;  [facility];  Service: Orthopedics;  Laterality: Left;   TOTAL SHOULDER ARTHROPLASTY Left 08/19/2018   Procedure: left shoulder replacement;  Surgeon: Meredith Pel, MD;  Location: [place];  Service: Orthopedics;  Laterality: Left;    His Family History Is Significant For: Family History  Problem Relation Age of Onset   Alzheimer's disease Father    Diabetes Father    Diabetes Other    Lung disease Other    Arthritis Other    Anesthesia problems Neg Hx    Hypotension Neg Hx    Malignant hyperthermia Neg Hx    Pseudochol deficiency Neg Hx    Sleep apnea Neg Hx     His Social History Is Significant For: Social History   Socioeconomic History   Marital status: Married    Spouse name: Not on file   Number of children: Not on file   Years of education: Not on file   Highest education level: Not on file  Occupational History   Occupation: Buyer, retail    Employer: DISABLED   Tobacco Use   Smoking status: Every Day    Packs/day: 1.00    Years: 44.00    Pack years: 44.00    Types: Cigarettes   Smokeless tobacco: Never   Tobacco comments:    smokes a pack a day. since age 56  Vaping Use   Vaping Use: Never used  Substance and Sexual Activity   Alcohol use: No    Alcohol/week: 0.0 standard drinks   Drug use: No   Sexual activity: Not Currently  Other Topics Concern   Not on file  Social History Narrative   Lives at home with wife   Right handed   Caffeine: 2 cups of coffee, 2 cans of soda, 4-5 cups of tea daily.   Social Determinants of Health   Financial Resource Strain: Not on file  Food Insecurity: Not on file  Transportation Needs: Not on file  Physical Activity: Not on file  Stress: Not on file  Social Connections: Not on file    His Allergies Are:  Allergies  Allergen Reactions   Celebrex [Celecoxib] Itching, Swelling and Other (See Comments)    All over   Cortisone Swelling    SWELLING REACTION UNSPECIFIED    Doxycycline Swelling and Other (See Comments)    Made tongue turn black    Prednisone Swelling    SWELLING REACTION UNSPECIFIED    Relafen [Nabumetone] Swelling    SWELLING REACTION UNSPECIFIED    Codeine Nausea And Vomiting and Other (See Comments)    Extreme stomach pain. This includes anything with Christopher derivative of codeine in it.   Oxycodone-Acetaminophen Itching and Other (See Comments)    Can not  tolerate with benadryl   :   His Current Medications Are:  Outpatient Encounter Medications as of 04/01/2021  Medication Sig   EPINEPHrine (EPIPEN JR) 0.15 MG/0.3ML injection Inject 0.15 mg into Christopher muscle as directed. As needed in Christopher morning for anaphylaxis   HYDROcodone-acetaminophen (NORCO) 10-325 MG tablet Take 1 tablet by mouth every 6 (six) hours as needed.   Menthol, Topical Analgesic, (ICY HOT EX) Apply 1 application topically daily  as needed (pain). Per [name] Christopher [name] will use as needed.   nitroGLYCERIN (NITROSTAT)  0.4 MG SL tablet PLACE (1) TABLET UNDER TONGUE EVERY 5 MINUTES UP TO (3) DOSES. IF NO RELIEF CALL 911.   pantoprazole (PROTONIX) 40 MG tablet TAKE (1) TABLET TWICE A DAY BEFORE MEALS. ([name] taking differently: Take 40 mg by mouth daily.)   PROAIR HFA 108 (90 Base) MCG/ACT inhaler Inhale 2 puffs into Christopher lungs every 6 (six) hours as needed for wheezing or shortness of breath.    rosuvastatin (CRESTOR) 10 MG tablet Take 10 mg by mouth daily.   sertraline (ZOLOFT) 50 MG tablet Take 50 mg by mouth daily.    temazepam (RESTORIL) 7.5 MG capsule Take 7.5 mg by mouth at bedtime.   No facility-administered encounter medications on file as of 04/01/2021.  :   Review of Systems:  Out of a complete 14 point review of systems, all are reviewed and negative with Christopher exception of these symptoms as listed below:   Review of Systems  Neurological:        [name] is here for a sleep consult. His last sleep study was at least 10 years ago. Pt reports Christopher [name] does have a CPAP machine at home however it does not work correctly and Christopher [name] was told not to use it. Christopher [name] was using it "for Christopher air" in Christopher past but no longer does. Christopher [name] states Christopher [name] misses it however. His wife has to stir him at night to make sure Christopher [name] is breathing.   Epworth Sleepiness Scale 0= would never doze 1= slight chance of dozing 2= moderate chance of dozing 3= high chance of dozing  Sitting and reading: 0 Watching TV: 3 Sitting inactive in a public place (ex. Theater or meeting): 0 As a passenger in a car for an hour without a break: 0 Lying down to rest in Christopher afternoon: 3 Sitting and talking to someone: 0 Sitting quietly after lunch (no alcohol): 3 In a car, while stopped in traffic: 0 Total: 9  FSS 62    Objective:  Neurological Exam  Physical Exam Physical Examination:   Vitals:   04/01/21 1222  BP: (!) 142/83  Pulse: 68    General Examination: Christopher [name] is a very pleasant 65 y.o. male in no acute distress. Christopher [name] appears  well-developed and well-nourished and well groomed.   HEENT: Normocephalic, atraumatic, pupils are equal, round and reactive to light, extraocular tracking is good without limitation to gaze excursion or nystagmus noted. Hearing is grossly intact. Face is symmetric with normal facial animation. Speech is clear with no dysarthria noted. There is no hypophonia. There is no lip, neck/head, jaw or voice tremor. Neck is supple with full range of passive and active motion. There are no carotid bruits on auscultation. Oropharynx exam reveals: Moderate mouth dryness, edentulous, Mallampati class IV.  Uvula and tonsils not fully visualized.  Tongue protrudes centrally.  Neck circumference of 19-1/2 inches.  Chest: Clear to auscultation without wheezing, rhonchi or crackles noted.  Heart: S1+S2+0, regular and normal without murmurs, rubs or gallops noted.   Abdomen: Soft, non-tender and non-distended.  Extremities: There is no pitting edema in Christopher distal lower extremities bilaterally.   Skin: Warm and dry without trophic changes noted.   Musculoskeletal: exam reveals right knee swelling.  Unremarkable left knee replacement scar.  Right knee in a brace.    Neurologically:  Mental status: Christopher [name] is awake, alert and oriented in all 4 spheres. His immediate and remote memory,  attention, language skills and fund of knowledge are appropriate. There is no evidence of aphasia, agnosia, apraxia or anomia. Speech is clear with normal prosody and enunciation. Thought process is linear. Mood is normal and affect is normal.  Cranial nerves II - XII are as described above under HEENT exam.  Motor exam: Normal bulk, strength and tone is noted. There is no tremor, fine motor skills and coordination: grossly intact.  Cerebellar testing: No dysmetria or intention tremor. There is no truncal or gait ataxia.  Sensory exam: intact to light touch in Christopher upper and lower extremities.  Gait, station and balance: Christopher [name] stands  easily. No veering to one side is noted. No leaning to one side is noted. Posture is age-appropriate and stance is narrow based. Gait shows mild limp on Christopher right.  No walking aid.   Assessment and Plan:  In summary, Christopher [name] is a very pleasant 65 y.o.-year old male with an underlying medical history of COPD, smoking, arthritis, reflux disease, gout, prediabetes, hyperlipidemia, irritable bowel syndrome, anxiety, depression, and obesity, who presents for evaluation of his obstructive sleep apnea of several years duration.  Christopher [name] has not been using his CPAP machine in at least 1 month.  Christopher [name] needs a new machine and new supplies.  Christopher [name] presumably was diagnosed with severe obstructive sleep apnea, testing was several years ago.  Christopher [name] is agreeable to reevaluation with a sleep study.  I had a long chat with Christopher [name] and his wife about my findings and Christopher diagnosis of OSA, its prognosis and treatment options. We talked about medical treatments, surgical interventions and non-pharmacological approaches. I explained in particular Christopher risks and ramifications of untreated moderate to severe OSA, especially with respect to developing cardiovascular disease down [place] Road, including congestive heart failure, difficult to treat hypertension, cardiac arrhythmias, or stroke. Even type 2 diabetes has, in part, been linked to untreated OSA. Symptoms of untreated OSA include daytime sleepiness, memory problems, mood irritability and mood disorder such as depression and anxiety, lack of energy, as well as recurrent headaches, especially morning headaches. We talked about Christopher importance of smoking cessation and trying to maintain a healthy lifestyle in general, as well as Christopher importance of weight control. We also talked about Christopher importance of good sleep hygiene. I recommended Christopher following at this time: sleep study.  I explained Christopher difference between a laboratory attended sleep study versus home sleep test.  I explained Christopher  sleep test procedure to Christopher [name] and also outlined possible surgical and non-surgical treatment options of OSA.  Christopher [name] previously benefited from positive airway pressure treatment.  Christopher [name] would be willing to get back on a CPAP machine.  We will reconvene after testing and keep him posted as to his test results by phone call as well. I answered all their questions today and Christopher [name] and his wife were in agreement.   Thank you very much for allowing me to participate in Christopher care of this nice [name]. If I can be of any further assistance to you please do not hesitate to call me at [phone].  Sincerely,   Star Age, MD, PhD

## 2021-04-01 NOTE — Patient Instructions (Signed)
Thank you for choosing [facility] for your sleep related care! It was nice to meet you today! I appreciate that you entrust me with your sleep related healthcare concerns. I hope, I was able to address at least some of your concerns today, and that I can help you feel reassured and also get better.    Here is what we discussed today and what we came up with as our plan for you:    Based on your symptoms and your exam I believe you are still at risk for obstructive sleep apnea and would benefit from reevaluation as it has been many years and you need new supplies and an updated machine. Therefore, I think we should proceed with a sleep study to determine how severe your sleep apnea is. If you have more than mild OSA, I want you to consider ongoing treatment with CPAP. Please remember, the risks and ramifications of moderate to severe obstructive sleep apnea or OSA are: Cardiovascular disease, including congestive heart failure, stroke, difficult to control hypertension, arrhythmias, and even type 2 diabetes has been linked to untreated OSA. Sleep apnea causes disruption of sleep and sleep deprivation in most cases, which, in turn, can cause recurrent headaches, problems with memory, mood, concentration, focus, and vigilance. Most people with untreated sleep apnea report excessive daytime sleepiness, which can affect their ability to drive. Please do not drive if you feel sleepy.   I will likely see you back after your sleep study to go over the test results and where to go from there. We will call you after your sleep study to advise about the results (most likely, you will hear from Kristen, my nurse) and to set up an appointment at the time, as necessary.    Our sleep lab administrative assistant will call you to schedule your sleep study. If you don't hear back from her by about 2 weeks from now, please feel free to call her at [phone]. You can leave a message with your phone  number and concerns, if you get the voicemail box. She will call back as soon as possible.     

## 2021-04-04 ENCOUNTER — Other Ambulatory Visit: Payer: Self-pay

## 2021-04-04 ENCOUNTER — Telehealth: Payer: Self-pay | Admitting: Radiology

## 2021-04-04 ENCOUNTER — Encounter: Payer: Self-pay | Admitting: Orthopedic Surgery

## 2021-04-04 ENCOUNTER — Ambulatory Visit (INDEPENDENT_AMBULATORY_CARE_PROVIDER_SITE_OTHER): Payer: PPO | Admitting: Orthopedic Surgery

## 2021-04-04 ENCOUNTER — Other Ambulatory Visit: Payer: Self-pay | Admitting: Radiology

## 2021-04-04 VITALS — BP 137/91 | HR 73 | Ht 66.0 in | Wt 225.0 lb

## 2021-04-04 DIAGNOSIS — G8929 Other chronic pain: Secondary | ICD-10-CM

## 2021-04-04 DIAGNOSIS — M1710 Unilateral primary osteoarthritis, unspecified knee: Secondary | ICD-10-CM

## 2021-04-04 DIAGNOSIS — M1711 Unilateral primary osteoarthritis, right knee: Secondary | ICD-10-CM

## 2021-04-04 MED ORDER — HYDROCODONE-ACETAMINOPHEN 10-325 MG PO TABS: 1.0000 | ORAL_TABLET | Freq: Four times a day (QID) | ORAL | 0 refills | Status: DC | PRN

## 2021-04-04 NOTE — Telephone Encounter (Signed)
I put in orders today for his total knee replacement, he has swelling and rash with Celebrex and Relafen ok to order the Zynrelef?

## 2021-04-04 NOTE — Patient Instructions (Addendum)
Your surgery will be at [facility] by Dr Harrison expect to be in the hospital overnight. The hospital will contact you with a preoperative appointment to discuss Anesthesia. Please bring your medications with you for the appointment. They will tell you the arrival time and medication instructions when you have your preoperative evaluation. Do not wear nail polish the day of your surgery and if you take Phentermine you need to stop this medication ONE WEEK prior to your surgery.   You will also get a call from a representative of [employer], they have a machine that you will use in the first few weeks after surgery. It is called a CPM.   You will have home physical therapy for 2 weeks after surgery, the home health agency will call you before or just following the surgery to set up visits. [facility] is the agency we normally use, unless you request another agency.   You will get a call also from outpatient therapy for therapy starting when the home therapy is done.  If you have questions or need to Reschedule the surgery, call the office ask for Kynesha Guerin.   You have decided to proceed with knee replacement surgery. You have decided not to continue with nonoperative measures such as but not limited to oral medication, weight loss, activity modification, physical therapy, bracing, or injection.  We will perform the procedure commonly known as total knee replacement. Some of the risks associated with knee replacement surgery include but are not limited to Bleeding Infection Swelling Stiffness Blood clot Pulmonary embolism  Loosening of the implant Pain that persists even after surgery  Infection is especially devastating complication of knee surgery although rare. If infection does occur your implant will usually have to be removed and several surgeries and antibiotics will be needed to eradicate the infection prior to performing a repeat replacement.   In some cases amputation is required to  eradicate the infection. In other rare cases a knee fusion is needed   In compliance with recent [place] law in federal regulation regarding opioid use and abuse and addiction, we will taper (stop) opioid medication after 2 weeks.  If you're not comfortable with these risks and would like to continue with nonoperative treatment please let Dr. Harrison know prior to your surgery.    

## 2021-04-04 NOTE — Telephone Encounter (Signed)
-----   Message from Uvaldo Bristle sent at 04/04/2021  3:14 PM EDT ----- Regarding: phone message Patient left voice message regarding pain medication at [facility]. Was it ordered? Christopher [name], Christopher [name] [id number] . (I saw already a phone note with today's date about medication)

## 2021-04-04 NOTE — Progress Notes (Signed)
FOLLOW UP   Encounter Diagnoses  Name Primary?   Chronic pain of right knee Yes   Primary localized osteoarthritis of knee      Chief Complaint  Patient presents with   Knee Pain    Right / wearing brace helps has pain out of brace wants to schedule total knee replacement after it completely heals     Mr. Woosley reports improvement in his brace  He says he is ready for knee replacement surgery.  We discussed the fact that he has some opioid tolerance and that he also is allergic to oxycodone but can take it with Benadryl and will have to do so after surgery to control his pain as he is already on hydrocodone 10 mg.  His range of motion is 0-1 25 he has tenderness on the medial joint line and medial femoral condyle but much less so now that he has been braced  He is amenable to right total knee arthroplasty with its inherent complications however his situation is come to the point where he cannot do his work activities and when he is out of the brace he has more extreme pain  Chronic problem with exacerbation, major surgery with risk factors (potential difficult pain control)

## 2021-04-11 ENCOUNTER — Telehealth: Payer: Self-pay

## 2021-04-11 NOTE — Telephone Encounter (Signed)
LVM for pt to call me back to schedule sleep study  

## 2021-04-15 DIAGNOSIS — J029 Acute pharyngitis, unspecified: Secondary | ICD-10-CM | POA: Diagnosis not present

## 2021-04-15 DIAGNOSIS — R059 Cough, unspecified: Secondary | ICD-10-CM | POA: Diagnosis not present

## 2021-04-15 DIAGNOSIS — R519 Headache, unspecified: Secondary | ICD-10-CM | POA: Diagnosis not present

## 2021-04-15 NOTE — Patient Instructions (Signed)
Christopher [name]  04/15/2021     '@[email or web address]'$ @   Your procedure is scheduled on 04/23/2021.   Report to [facility] at 1000 A.M.   Call this number if you have problems the morning of surgery:  [phone]   Remember:  Do not eat or drink after midnight.     Take these medicines the morning of surgery with A SIP OF WATER                  hydrocodone(if needed), protonix, zoloft.     Do not wear jewelry, make-up or nail polish.  Do not wear lotions, powders, or perfumes, or deodorant.  Do not shave 48 hours prior to surgery.  Men may shave face and neck.  Do not bring valuables to the hospital.  [facility] is not responsible for any belongings or valuables.  Contacts, dentures or bridgework may not be worn into surgery.  Leave your suitcase in the car.  After surgery it may be brought to your room.  For patients admitted to the hospital, discharge time will be determined by your treatment team.  Patients discharged the day of surgery will not be allowed to drive home and must have someone with them for 24 hours.    Special instructions:    DO NOT smoke tobacco or smoke for 24 hours.  Please read over the following fact sheets that you were given. Coughing and Deep Breathing, Lab Information, Total Joint Packet, Surgical Site Infection Prevention, Anesthesia Post-op Instructions, and Care and Recovery After Surgery      Total Knee Replacement, Care After This sheet gives you information about how to care for yourself after your procedure. Your health care provider may also give you more specific instructions. If you have problems or questions, contact your health careprovider. What can I expect after the procedure? After the procedure, it is common to have: Redness, pain, and swelling at the incision area. Stiffness. Discomfort. A small amount of blood or clear fluid coming from your incision. Follow these instructions at home: Medicines Take  over-the-counter and prescription medicines only as told by your health care provider. If you were prescribed a blood thinner (anticoagulant), take it as told by your health care provider. Ask your health care provider if the medicine prescribed to you: Requires you to avoid driving or using machinery. Can cause constipation. You may need to take these actions to prevent or treat constipation: Drink enough fluid to keep your urine pale yellow. Take over-the-counter or prescription medicines. Eat foods that are high in fiber, such as beans, whole grains, and fresh fruits and vegetables. Limit foods that are high in fat and processed sugars, such as fried or sweet foods. Incision care  Follow instructions from your health care provider about how to take care of your incision. Make sure you: Wash your hands with soap and water for at least 20 seconds before and after you change your bandage (dressing). If soap and water are not available, use hand sanitizer. Change your dressing as told by your health care provider. Leave stitches (sutures), staples, skin glue, or adhesive strips in place. These skin closures may need to stay in place for 2 weeks or longer. If adhesive strip edges start to loosen and curl up, you may trim the loose edges. Do not remove adhesive strips completely unless your health care provider tells you to do that. Do not take baths, swim, or use  a hot tub until your health care provider approves. Check your incision area every day for signs of infection. Check for: More redness, swelling, or pain. More fluid or blood. Warmth. Pus or a bad smell.  Activity Rest as told by your health care provider. Avoid sitting for a long time without moving. Get up to take short walks every 1-2 hours. This is important to improve blood flow and breathing. Ask for help if you feel weak or unsteady. Follow instructions from your health care provider about using a walker, crutches, or a  cane. You may use your legs to support (bear) your body weight as told by your health care provider. Follow instructions about how much weight you may safely support on your affected leg (weight-bearing restrictions). A physical therapist may show you how to get out of a bed and chair and how to go up and down stairs. You will first do this with a walker, crutches, or a cane and then without any of these devices. Once you are able to walk without a limp, you may stop using a walker, crutches, or a cane. Do exercises as told by your health care provider or physical therapist. Avoid high-impact activities, including running, jumping rope, and doing jumping jacks. Do not play contact sports until your health care provider approves. Return to your normal activities as told by your health care provider. Ask your health care provider what activities are safe for you. Managing pain, stiffness, and swelling  If directed, put ice on your knee. To do this: Put ice in a plastic bag or use the icing device (cold flow pad) that you were given. Follow instructions from your health care provider about how to use the icing device. Place a towel between your skin and the bag or between your skin and the icing device. Leave the ice on for 20 minutes, 2-3 times a day. Remove the ice if your skin turns bright red. This is very important. If you cannot feel pain, heat, or cold, you have a greater risk of damage to the area. Move your toes often to reduce stiffness and swelling. Raise (elevate) your leg above the level of your heart while you are sitting or lying down. Use several pillows to keep your leg straight. Do not put a pillow just under the knee. If the knee is bent for a long time, this may lead to stiffness. Wear elastic knee support as told by your health care provider.  Safety  To help prevent falls, keep floors clear of objects you may trip over. Place items that you may need within easy reach. Wear  an apron or tool belt with pockets for carrying objects. This leaves your hands free to help with your balance. Ask your health care provider when it is safe to drive.  General instructions Wear compression stockings as told by your health care provider. These stockings help to prevent blood clots and reduce swelling in your legs. Continue with breathing exercises. This helps prevent lung infection. Do not use any products that contain nicotine or tobacco. These products include cigarettes, chewing tobacco, and vaping devices, such as e-cigarettes. These can delay healing after surgery. If you need help quitting, ask your health care provider. Tell your health care provider if you plan to have dental work. Also: Tell your dentist about your joint replacement. Ask your health care provider if there are any special instructions you need to follow before having dental care and routine cleanings. Keep all follow-up  visits. This is important. Contact a health care provider if: You have a fever or chills. You have a cough or feel short of breath. Your medicine is not controlling your pain. You have any of these signs of infection: More redness, swelling, or pain around your incision. More fluid or blood coming from your incision. Warmth coming from your incision. Pus or a bad smell coming from your incision. You fall. Get help right away if: You have severe pain. You have trouble breathing. You have chest pain. You have redness, swelling, pain, or warmth in your calf or leg. Your incision breaks open after sutures or staples are removed. These symptoms may represent a serious problem that is an emergency. Do not wait to see if the symptoms will go away. Get medical help right away. Call your local emergency services (911 in the [place] Do not drive yourself to the hospital. Summary After the procedure, it is common to have pain and swelling at the incision area, a small amount of blood or fluid  coming from your incision, and stiffness. Follow instructions from your health care provider about how to take care of your incision. Use crutches, a walker, or a cane as told by your health care provider. This information is not intended to replace advice given to you by your health care provider. Make sure you discuss any questions you have with your healthcare provider. Document Revised: 02/07/2020 Document Reviewed: 02/07/2020 Elsevier Patient Education  [place]. Spinal Anesthesia and Epidural Anesthesia, Care After This sheet gives you information about how to care for yourself after your procedure. Your doctor may also give you more specific instructions. If youhave problems or questions, contact your doctor. What can I expect after the procedure? While the medicines you were given are still having effects, it is common to: Feel like you may vomit (nauseous). Vomit. Have a numb feeling or tingling in your legs. Have problems when you pee (urinate). Feel itchy. Follow these instructions at home: For the time period you were told by your doctor:  Rest. Do not do activities where you could fall or get hurt. Do not drive or use machines. Do not drink alcohol. Do not take sleeping pills or medicines that make you drowsy. Do not make big decisions or sign legal documents. Do not take care of children on your own.  Eating and drinking If you vomit, wait a short time. When you can drink without vomiting, try to drink some water, juice, or clear soup. Drink enough fluid to keep your pee (urine) pale yellow. Make sure you do not feel like vomiting before you eat solid foods. Follow the diet that your doctor recommends. General instructions If you have sleep apnea, surgery and certain medicines can raise your risk for breathing problems. Follow instructions from your doctor about when to wear your sleep device. Have a responsible adult stay with you for the time you are told.  It is important to have someone help care for you until you are awake and alert. Return to your normal activities as told by your doctor. Ask your doctor what activities are safe for you. Take over-the-counter and prescription medicines only as told by your doctor. Do not use any products that contain nicotine or tobacco, such as cigarettes, e-cigarettes, and chewing tobacco. If you need help quitting, ask your doctor. Keep all follow-up visits as told by your doctor. This is important. Contact a doctor if: It has been more than one day since your  procedure and you feel like you may vomit or you are vomiting. You have a rash. Get help right away if: You have a fever. You have a headache that lasts a long time. You have a very bad headache. Your vision is blurry or you see two of a single object (double vision). You are dizzy or light-headed. You faint. Your arms or legs tingle, feel weak, or get numb. You have trouble breathing. You cannot pee. These symptoms may be an emergency. Get help right away. Call your local emergency services (911 in the [place] Do not wait to see if the symptoms will go away. Do not drive yourself to the hospital. Summary After the procedure, have a responsible adult stay with you at home. Do not do activities that might cause you to get hurt. Do not drive, use machinery, drink alcohol, or make important decisions as told by your doctor. Do not use products that contain nicotine or tobacco. Get help right away if you have a very bad headache, trouble breathing, or you cannot pee. This information is not intended to replace advice given to you by your health care provider. Make sure you discuss any questions you have with your healthcare provider. Document Revised: 05/03/2020 Document Reviewed: 10/06/2019 Elsevier Patient Education  [place] Anesthesia, Adult, Care After This sheet gives you information about how to care for yourself after  your procedure. Your health care provider may also give you more specific instructions. If you have problems or questions, contact your health careprovider. What can I expect after the procedure? After the procedure, the following side effects are common: Pain or discomfort at the IV site. Nausea. Vomiting. Sore throat. Trouble concentrating. Feeling cold or chills. Feeling weak or tired. Sleepiness and fatigue. Soreness and body aches. These side effects can affect parts of the body that were not involved in surgery. Follow these instructions at home: For the time period you were told by your health care provider:  Rest. Do not participate in activities where you could fall or become injured. Do not drive or use machinery. Do not drink alcohol. Do not take sleeping pills or medicines that cause drowsiness. Do not make important decisions or sign legal documents. Do not take care of children on your own.  Eating and drinking Follow any instructions from your health care provider about eating or drinking restrictions. When you feel hungry, start by eating small amounts of foods that are soft and easy to digest (bland), such as toast. Gradually return to your regular diet. Drink enough fluid to keep your urine pale yellow. If you vomit, rehydrate by drinking water, juice, or clear broth. General instructions If you have sleep apnea, surgery and certain medicines can increase your risk for breathing problems. Follow instructions from your health care provider about wearing your sleep device: Anytime you are sleeping, including during daytime naps. While taking prescription pain medicines, sleeping medicines, or medicines that make you drowsy. Have a responsible adult stay with you for the time you are told. It is important to have someone help care for you until you are awake and alert. Return to your normal activities as told by your health care provider. Ask your health care provider  what activities are safe for you. Take over-the-counter and prescription medicines only as told by your health care provider. If you smoke, do not smoke without supervision. Keep all follow-up visits as told by your health care provider. This is important. Contact a health care provider  if: You have nausea or vomiting that does not get better with medicine. You cannot eat or drink without vomiting. You have pain that does not get better with medicine. You are unable to pass urine. You develop a skin rash. You have a fever. You have redness around your IV site that gets worse. Get help right away if: You have difficulty breathing. You have chest pain. You have blood in your urine or stool, or you vomit blood. Summary After the procedure, it is common to have a sore throat or nausea. It is also common to feel tired. Have a responsible adult stay with you for the time you are told. It is important to have someone help care for you until you are awake and alert. When you feel hungry, start by eating small amounts of foods that are soft and easy to digest (bland), such as toast. Gradually return to your regular diet. Drink enough fluid to keep your urine pale yellow. Return to your normal activities as told by your health care provider. Ask your health care provider what activities are safe for you. This information is not intended to replace advice given to you by your health care provider. Make sure you discuss any questions you have with your healthcare provider. Document Revised: 05/03/2020 Document Reviewed: 12/01/2019 Elsevier Patient Education  2022 [place]. How to Use Chlorhexidine for Bathing Chlorhexidine gluconate (CHG) is a germ-killing (antiseptic) solution that is used to clean the skin. It can get rid of the bacteria that normally live on the skin and can keep them away for about 24 hours. To clean your skin with CHG, you may be given: A CHG solution to use in the shower or  as part of a sponge bath. A prepackaged cloth that contains CHG. Cleaning your skin with CHG may help lower the risk for infection: While you are staying in the intensive care unit of the hospital. If you have a vascular access, such as a central line, to provide short-term or long-term access to your veins. If you have a catheter to drain urine from your bladder. If you are on a ventilator. A ventilator is a machine that helps you breathe by moving air in and out of your lungs. After surgery. What are the risks? Risks of using CHG include: A skin reaction. Hearing loss, if CHG gets in your ears. Eye injury, if CHG gets in your eyes and is not rinsed out. The CHG product catching fire. Make sure that you avoid smoking and flames after applying CHG to your skin. Do not use CHG: If you have a chlorhexidine allergy or have previously reacted to chlorhexidine. On babies younger than 72 months of age. How to use CHG solution Use CHG only as told by your health care provider, and follow the instructions on the label. Use the full amount of CHG as directed. Usually, this is one bottle. During a shower Follow these steps when using CHG solution during a shower (unless your health care provider gives you different instructions): Start the shower. Use your normal soap and shampoo to wash your face and hair. Turn off the shower or move out of the shower stream. Pour the CHG onto a clean washcloth. Do not use any type of brush or rough-edged sponge. Starting at your neck, lather your body down to your toes. Make sure you follow these instructions: If you will be having surgery, pay special attention to the part of your body where you will be having  surgery. Scrub this area for at least 1 minute. Do not use CHG on your head or face. If the solution gets into your ears or eyes, rinse them well with water. Avoid your genital area. Avoid any areas of skin that have broken skin, cuts, or scrapes. Scrub  your back and under your arms. Make sure to wash skin folds. Let the lather sit on your skin for 1-2 minutes or as long as told by your health care provider. Thoroughly rinse your entire body in the shower. Make sure that all body creases and crevices are rinsed well. Dry off with a clean towel. Do not put any substances on your body afterward--such as powder, lotion, or perfume--unless you are told to do so by your health care provider. Only use lotions that are recommended by the manufacturer. Put on clean clothes or pajamas. If it is the night before your surgery, sleep in clean sheets.  During a sponge bath Follow these steps when using CHG solution during a sponge bath (unless your health care provider gives you different instructions): Use your normal soap and shampoo to wash your face and hair. Pour the CHG onto a clean washcloth. Starting at your neck, lather your body down to your toes. Make sure you follow these instructions: If you will be having surgery, pay special attention to the part of your body where you will be having surgery. Scrub this area for at least 1 minute. Do not use CHG on your head or face. If the solution gets into your ears or eyes, rinse them well with water. Avoid your genital area. Avoid any areas of skin that have broken skin, cuts, or scrapes. Scrub your back and under your arms. Make sure to wash skin folds. Let the lather sit on your skin for 1-2 minutes or as long as told by your health care provider. Using a different clean, wet washcloth, thoroughly rinse your entire body. Make sure that all body creases and crevices are rinsed well. Dry off with a clean towel. Do not put any substances on your body afterward--such as powder, lotion, or perfume--unless you are told to do so by your health care provider. Only use lotions that are recommended by the manufacturer. Put on clean clothes or pajamas. If it is the night before your surgery, sleep in clean  sheets. How to use CHG prepackaged cloths Only use CHG cloths as told by your health care provider, and follow the instructions on the label. Use the CHG cloth on clean, dry skin. Do not use the CHG cloth on your head or face unless your health care provider tells you to. When washing with the CHG cloth: Avoid your genital area. Avoid any areas of skin that have broken skin, cuts, or scrapes. Before surgery Follow these steps when using a CHG cloth to clean before surgery (unless your health care provider gives you different instructions): Using the CHG cloth, vigorously scrub the part of your body where you will be having surgery. Scrub using a back-and-forth motion for 3 minutes. The area on your body should be completely wet with CHG when you are done scrubbing. Do not rinse. Discard the cloth and let the area air-dry. Do not put any substances on the area afterward, such as powder, lotion, or perfume. Put on clean clothes or pajamas. If it is the night before your surgery, sleep in clean sheets.  For general bathing Follow these steps when using CHG cloths for general bathing (unless your health  care provider gives you different instructions). Use a separate CHG cloth for each area of your body. Make sure you wash between any folds of skin and between your fingers and toes. Wash your body in the following order, switching to a new cloth after each step: The front of your neck, shoulders, and chest. Both of your arms, under your arms, and your hands. Your stomach and groin area, avoiding the genitals. Your right leg and foot. Your left leg and foot. The back of your neck, your back, and your buttocks. Do not rinse. Discard the cloth and let the area air-dry. Do not put any substances on your body afterward--such as powder, lotion, or perfume--unless you are told to do so by your health care provider. Only use lotions that are recommended by the manufacturer. Put on clean clothes or  pajamas. Contact a health care provider if: Your skin gets irritated after scrubbing. You have questions about using your solution or cloth. Get help right away if: Your eyes become very red or swollen. Your eyes itch badly. Your skin itches badly and is red or swollen. Your hearing changes. You have trouble seeing. You have swelling or tingling in your mouth or throat. You have trouble breathing. You swallow any chlorhexidine. Summary Chlorhexidine gluconate (CHG) is a germ-killing (antiseptic) solution that is used to clean the skin. Cleaning your skin with CHG may help to lower your risk for infection. You may be given CHG to use for bathing. It may be in a bottle or in a prepackaged cloth to use on your skin. Carefully follow your health care provider's instructions and the instructions on the product label. Do not use CHG if you have a chlorhexidine allergy. Contact your health care provider if your skin gets irritated after scrubbing. This information is not intended to replace advice given to you by your health care provider. Make sure you discuss any questions you have with your healthcare provider. Document Revised: 12/30/2019 Document Reviewed: 02/03/2020 Elsevier Patient Education  [place].

## 2021-04-16 ENCOUNTER — Telehealth: Payer: Self-pay | Admitting: Orthopedic Surgery

## 2021-04-16 NOTE — Telephone Encounter (Signed)
Voice message received/call returned - per patient/designated party, wife Zelphia Cairo, left phone number [phone]. I returned call, no voice mail is set up on this number. I called home [phone], left message on this number to return call.

## 2021-04-17 ENCOUNTER — Telehealth: Payer: Self-pay

## 2021-04-17 ENCOUNTER — Telehealth: Payer: Self-pay | Admitting: Radiology

## 2021-04-17 NOTE — Telephone Encounter (Signed)
Yes lets reschedule it and then lets put Christopher [name] in his place the patella fracture, also let me see him next Thursday to plan for reschedule  Christopher [name] will have to take her last Plavix today for surgery Tuesday

## 2021-04-17 NOTE — Telephone Encounter (Signed)
I spoke to wife Explained need to see him in office asked them to come in [phone] on Thursday 25th Can you please put on schedule? I cant blocked out for new  Thanks

## 2021-04-17 NOTE — Telephone Encounter (Signed)
LVM for pt to call me back to schedule sleep study  

## 2021-04-17 NOTE — Telephone Encounter (Signed)
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

## 2021-04-19 ENCOUNTER — Encounter (HOSPITAL_COMMUNITY)
Admission: RE | Admit: 2021-04-19 | Discharge: 2021-04-19 | Disposition: A | Discharge status: Home / Self Care | Payer: PPO | Source: Ambulatory Visit | Attending: Orthopedic Surgery | Admitting: Orthopedic Surgery

## 2021-04-19 ENCOUNTER — Other Ambulatory Visit (HOSPITAL_COMMUNITY): Payer: PPO

## 2021-04-19 ENCOUNTER — Encounter (HOSPITAL_COMMUNITY): Payer: Self-pay

## 2021-04-22 ENCOUNTER — Telehealth: Payer: Self-pay

## 2021-04-22 NOTE — Telephone Encounter (Signed)
We have attempted to call the patient 2 times to schedule sleep study. Patient has been unavailable at the phone numbers we have on file and has not returned our calls. If patient calls back we will schedule them for their sleep study. ° °

## 2021-04-25 ENCOUNTER — Encounter: Payer: Self-pay | Admitting: Orthopedic Surgery

## 2021-04-25 ENCOUNTER — Institutional Professional Consult (permissible substitution): Payer: PPO | Admitting: Neurology

## 2021-04-25 ENCOUNTER — Ambulatory Visit (INDEPENDENT_AMBULATORY_CARE_PROVIDER_SITE_OTHER): Payer: PPO | Admitting: Orthopedic Surgery

## 2021-04-25 ENCOUNTER — Other Ambulatory Visit: Payer: Self-pay

## 2021-04-25 VITALS — BP 145/77 | HR 85 | Ht 66.0 in | Wt 224.0 lb

## 2021-04-25 DIAGNOSIS — M1711 Unilateral primary osteoarthritis, right knee: Secondary | ICD-10-CM

## 2021-04-25 DIAGNOSIS — M1710 Unilateral primary osteoarthritis, unspecified knee: Secondary | ICD-10-CM

## 2021-04-25 NOTE — Progress Notes (Signed)
Chief Complaint  Patient presents with   Knee Pain    Follow up// discuss right knee surgery that had to be rescheduled    Encounter Diagnosis  Name Primary?   Primary localized osteoarthritis of knee Yes    65 year old male scheduled for total knee arthroplasty called in with symptoms of upper respiratory illness, surgery canceled patient here for reevaluation to plan for surgery rescheduling  Christopher [name] had a severe sore throat and cough shortness of breath was evaluated 2 times with negative COVID test negative flu testing was put on amoxicillin initially did not improve seem to get better with prednisone and nebulizer treatments  Today still coughing no fever hopefully we can reschedule the surgery for 13 September  I want him to see his primary care doctor to see if we can 1 get a diagnosis and to get more treatment for symptoms

## 2021-04-29 ENCOUNTER — Telehealth: Payer: Self-pay

## 2021-04-29 NOTE — Telephone Encounter (Signed)
Returned call to wife. No answer, LVM for pt to call me back to schedule sleep study

## 2021-05-01 DIAGNOSIS — J029 Acute pharyngitis, unspecified: Secondary | ICD-10-CM | POA: Diagnosis not present

## 2021-05-01 DIAGNOSIS — R059 Cough, unspecified: Secondary | ICD-10-CM | POA: Diagnosis not present

## 2021-05-02 ENCOUNTER — Other Ambulatory Visit: Payer: Self-pay | Admitting: Orthopedic Surgery

## 2021-05-02 DIAGNOSIS — R059 Cough, unspecified: Secondary | ICD-10-CM | POA: Diagnosis not present

## 2021-05-02 DIAGNOSIS — I6523 Occlusion and stenosis of bilateral carotid arteries: Secondary | ICD-10-CM | POA: Diagnosis not present

## 2021-05-02 DIAGNOSIS — M1710 Unilateral primary osteoarthritis, unspecified knee: Secondary | ICD-10-CM

## 2021-05-02 DIAGNOSIS — R590 Localized enlarged lymph nodes: Secondary | ICD-10-CM | POA: Diagnosis not present

## 2021-05-02 DIAGNOSIS — J029 Acute pharyngitis, unspecified: Secondary | ICD-10-CM | POA: Diagnosis not present

## 2021-05-07 ENCOUNTER — Telehealth: Payer: Self-pay | Admitting: Orthopedic Surgery

## 2021-05-07 NOTE — Telephone Encounter (Signed)
He finishes ABX today and is feeling much better, I told her I think will be fine for the preop on Friday and surgery as scheduled on 05/14/21 if not, I will let her know

## 2021-05-07 NOTE — Telephone Encounter (Signed)
Patient and designated contact, wife Zelphia Cairo, called to relay that he is doing well on the antiobiotic prescribed by his primary care, Dr Huel Cote (Levofloxacin) and also the cough syrup (Promethazine) and states would like to proceed with Pre-op appointment on Friday, 05/10/21. Please advise.

## 2021-05-08 ENCOUNTER — Encounter: Payer: PPO | Admitting: Orthopedic Surgery

## 2021-05-08 NOTE — Patient Instructions (Signed)
Christopher [name]  05/08/2021     '@[email or web address]'$ @   Your procedure is scheduled on  05/14/2021.   Report to [facility] at  [phone]  A.M.   Call this number if you have problems the morning of surgery:  [phone]   Remember:  Do not eat or drink after midnight.     Use your inhaler before you come and bring your rescue inhaler with you.    Take these medicines the morning of surgery with A SIP OF WATER                    Norco (if needed), protonix, zoloft.     Do not wear jewelry, make-up or nail polish.  Do not wear lotions, powders, or perfumes, or deodorant.  Do not shave 48 hours prior to surgery.  Men may shave face and neck.  Do not bring valuables to the hospital.  [facility] is not responsible for any belongings or valuables.  Contacts, dentures or bridgework may not be worn into surgery.  Leave your suitcase in the car.  After surgery it may be brought to your room.  For patients admitted to the hospital, discharge time will be determined by your treatment team.  Patients discharged the day of surgery will not be allowed to drive home and must have someone with them for 24 hours.    Special instructions:   DO NOT smoke tobacco or vape for 24 hours before your procedure.  Please read over the following fact sheets that you were given. Pain Booklet, Coughing and Deep Breathing, Blood Transfusion Information, Total Joint Packet, Surgical Site Infection Prevention, Anesthesia Post-op Instructions, and Care and Recovery After Surgery      Total Knee Replacement, Care After This sheet gives you information about how to care for yourself after your procedure. Your health care provider may also give you more specific instructions. If you have problems or questions, contact your health care provider. What can I expect after the procedure? After the procedure, it is common to have: Redness, pain, and swelling at the incision  area. Stiffness. Discomfort. A small amount of blood or clear fluid coming from your incision. Follow these instructions at home: Medicines Take over-the-counter and prescription medicines only as told by your health care provider. If you were prescribed a blood thinner (anticoagulant), take it as told by your health care provider. Ask your health care provider if the medicine prescribed to you: Requires you to avoid driving or using machinery. Can cause constipation. You may need to take these actions to prevent or treat constipation: Drink enough fluid to keep your urine pale yellow. Take over-the-counter or prescription medicines. Eat foods that are high in fiber, such as beans, whole grains, and fresh fruits and vegetables. Limit foods that are high in fat and processed sugars, such as fried or sweet foods. Incision care  Follow instructions from your health care provider about how to take care of your incision. Make sure you: Wash your hands with soap and water for at least 20 seconds before and after you change your bandage (dressing). If soap and water are not available, use hand sanitizer. Change your dressing as told by your health care provider. Leave stitches (sutures), staples, skin glue, or adhesive strips in place. These skin closures may need to stay in place for 2 weeks or longer. If adhesive strip edges start to loosen and curl up, you  may trim the loose edges. Do not remove adhesive strips completely unless your health care provider tells you to do that. Do not take baths, swim, or use a hot tub until your health care provider approves. Check your incision area every day for signs of infection. Check for: More redness, swelling, or pain. More fluid or blood. Warmth. Pus or a bad smell. Activity Rest as told by your health care provider. Avoid sitting for a long time without moving. Get up to take short walks every 1-2 hours. This is important to improve blood flow and  breathing. Ask for help if you feel weak or unsteady. Follow instructions from your health care provider about using a walker, crutches, or a cane. You may use your legs to support (bear) your body weight as told by your health care provider. Follow instructions about how much weight you may safely support on your affected leg (weight-bearing restrictions). A physical therapist may show you how to get out of a bed and chair and how to go up and down stairs. You will first do this with a walker, crutches, or a cane and then without any of these devices. Once you are able to walk without a limp, you may stop using a walker, crutches, or a cane. Do exercises as told by your health care provider or physical therapist. Avoid high-impact activities, including running, jumping rope, and doing jumping jacks. Do not play contact sports until your health care provider approves. Return to your normal activities as told by your health care provider. Ask your health care provider what activities are safe for you. Managing pain, stiffness, and swelling  If directed, put ice on your knee. To do this: Put ice in a plastic bag or use the icing device (cold flow pad) that you were given. Follow instructions from your health care provider about how to use the icing device. Place a towel between your skin and the bag or between your skin and the icing device. Leave the ice on for 20 minutes, 2-3 times a day. Remove the ice if your skin turns bright red. This is very important. If you cannot feel pain, heat, or cold, you have a greater risk of damage to the area. Move your toes often to reduce stiffness and swelling. Raise (elevate) your leg above the level of your heart while you are sitting or lying down. Use several pillows to keep your leg straight. Do not put a pillow just under the knee. If the knee is bent for a long time, this may lead to stiffness. Wear elastic knee support as told by your health care  provider. Safety  To help prevent falls, keep floors clear of objects you may trip over. Place items that you may need within easy reach. Wear an apron or tool belt with pockets for carrying objects. This leaves your hands free to help with your balance. Ask your health care provider when it is safe to drive. General instructions Wear compression stockings as told by your health care provider. These stockings help to prevent blood clots and reduce swelling in your legs. Continue with breathing exercises. This helps prevent lung infection. Do not use any products that contain nicotine or tobacco. These products include cigarettes, chewing tobacco, and vaping devices, such as e-cigarettes. These can delay healing after surgery. If you need help quitting, ask your health care provider. Tell your health care provider if you plan to have dental work. Also: Tell your dentist about your joint replacement.  Ask your health care provider if there are any special instructions you need to follow before having dental care and routine cleanings. Keep all follow-up visits. This is important. Contact a health care provider if: You have a fever or chills. You have a cough or feel short of breath. Your medicine is not controlling your pain. You have any of these signs of infection: More redness, swelling, or pain around your incision. More fluid or blood coming from your incision. Warmth coming from your incision. Pus or a bad smell coming from your incision. You fall. Get help right away if: You have severe pain. You have trouble breathing. You have chest pain. You have redness, swelling, pain, or warmth in your calf or leg. Your incision breaks open after sutures or staples are removed. These symptoms may represent a serious problem that is an emergency. Do not wait to see if the symptoms will go away. Get medical help right away. Call your local emergency services (911 in the [place] Do not drive  yourself to the hospital. Summary After the procedure, it is common to have pain and swelling at the incision area, a small amount of blood or fluid coming from your incision, and stiffness. Follow instructions from your health care provider about how to take care of your incision. Use crutches, a walker, or a cane as told by your health care provider. This information is not intended to replace advice given to you by your health care provider. Make sure you discuss any questions you have with your health care provider. Document Revised: 02/07/2020 Document Reviewed: 02/07/2020 Elsevier Patient Education  [place]. Spinal Anesthesia and Epidural Anesthesia, Care After This sheet gives you information about how to care for yourself after your procedure. Your doctor may also give you more specific instructions. If you have problems or questions, contact your doctor. What can I expect after the procedure? While the medicines you were given are still having effects, it is common to: Feel like you may vomit (nauseous). Vomit. Have a numb feeling or tingling in your legs. Have problems when you pee (urinate). Feel itchy. Follow these instructions at home: For the time period you were told by your doctor:  Rest. Do not do activities where you could fall or get hurt. Do not drive or use machines. Do not drink alcohol. Do not take sleeping pills or medicines that make you drowsy. Do not make big decisions or sign legal documents. Do not take care of children on your own. Eating and drinking If you vomit, wait a short time. When you can drink without vomiting, try to drink some water, juice, or clear soup. Drink enough fluid to keep your pee (urine) pale yellow. Make sure you do not feel like vomiting before you eat solid foods. Follow the diet that your doctor recommends. General instructions If you have sleep apnea, surgery and certain medicines can raise your risk for breathing  problems. Follow instructions from your doctor about when to wear your sleep device. Have a responsible adult stay with you for the time you are told. It is important to have someone help care for you until you are awake and alert. Return to your normal activities as told by your doctor. Ask your doctor what activities are safe for you. Take over-the-counter and prescription medicines only as told by your doctor. Do not use any products that contain nicotine or tobacco, such as cigarettes, e-cigarettes, and chewing tobacco. If you need help quitting, ask your  doctor. Keep all follow-up visits as told by your doctor. This is important. Contact a doctor if: It has been more than one day since your procedure and you feel like you may vomit or you are vomiting. You have a rash. Get help right away if: You have a fever. You have a headache that lasts a long time. You have a very bad headache. Your vision is blurry or you see two of a single object (double vision). You are dizzy or light-headed. You faint. Your arms or legs tingle, feel weak, or get numb. You have trouble breathing. You cannot pee. These symptoms may be an emergency. Get help right away. Call your local emergency services (911 in the [place] Do not wait to see if the symptoms will go away. Do not drive yourself to the hospital. Summary After the procedure, have a responsible adult stay with you at home. Do not do activities that might cause you to get hurt. Do not drive, use machinery, drink alcohol, or make important decisions as told by your doctor. Do not use products that contain nicotine or tobacco. Get help right away if you have a very bad headache, trouble breathing, or you cannot pee. This information is not intended to replace advice given to you by your health care provider. Make sure you discuss any questions you have with your health care provider. Document Revised: 05/03/2020 Document Reviewed:  10/06/2019 Elsevier Patient Education  [place] Anesthesia, Adult, Care After This sheet gives you information about how to care for yourself after your procedure. Your health care provider may also give you more specific instructions. If you have problems or questions, contact your health care provider. What can I expect after the procedure? After the procedure, the following side effects are common: Pain or discomfort at the IV site. Nausea. Vomiting. Sore throat. Trouble concentrating. Feeling cold or chills. Feeling weak or tired. Sleepiness and fatigue. Soreness and body aches. These side effects can affect parts of the body that were not involved in surgery. Follow these instructions at home: For the time period you were told by your health care provider:  Rest. Do not participate in activities where you could fall or become injured. Do not drive or use machinery. Do not drink alcohol. Do not take sleeping pills or medicines that cause drowsiness. Do not make important decisions or sign legal documents. Do not take care of children on your own. Eating and drinking Follow any instructions from your health care provider about eating or drinking restrictions. When you feel hungry, start by eating small amounts of foods that are soft and easy to digest (bland), such as toast. Gradually return to your regular diet. Drink enough fluid to keep your urine pale yellow. If you vomit, rehydrate by drinking water, juice, or clear broth. General instructions If you have sleep apnea, surgery and certain medicines can increase your risk for breathing problems. Follow instructions from your health care provider about wearing your sleep device: Anytime you are sleeping, including during daytime naps. While taking prescription pain medicines, sleeping medicines, or medicines that make you drowsy. Have a responsible adult stay with you for the time you are told. It is  important to have someone help care for you until you are awake and alert. Return to your normal activities as told by your health care provider. Ask your health care provider what activities are safe for you. Take over-the-counter and prescription medicines only as told by your health care provider.  If you smoke, do not smoke without supervision. Keep all follow-up visits as told by your health care provider. This is important. Contact a health care provider if: You have nausea or vomiting that does not get better with medicine. You cannot eat or drink without vomiting. You have pain that does not get better with medicine. You are unable to pass urine. You develop a skin rash. You have a fever. You have redness around your IV site that gets worse. Get help right away if: You have difficulty breathing. You have chest pain. You have blood in your urine or stool, or you vomit blood. Summary After the procedure, it is common to have a sore throat or nausea. It is also common to feel tired. Have a responsible adult stay with you for the time you are told. It is important to have someone help care for you until you are awake and alert. When you feel hungry, start by eating small amounts of foods that are soft and easy to digest (bland), such as toast. Gradually return to your regular diet. Drink enough fluid to keep your urine pale yellow. Return to your normal activities as told by your health care provider. Ask your health care provider what activities are safe for you. This information is not intended to replace advice given to you by your health care provider. Make sure you discuss any questions you have with your health care provider. Document Revised: 05/03/2020 Document Reviewed: 12/01/2019 Elsevier Patient Education  2022 [place]. How to Use Chlorhexidine for Bathing Chlorhexidine gluconate (CHG) is a germ-killing (antiseptic) solution that is used to clean the skin. It can get  rid of the bacteria that normally live on the skin and can keep them away for about 24 hours. To clean your skin with CHG, you may be given: A CHG solution to use in the shower or as part of a sponge bath. A prepackaged cloth that contains CHG. Cleaning your skin with CHG may help lower the risk for infection: While you are staying in the intensive care unit of the hospital. If you have a vascular access, such as a central line, to provide short-term or long-term access to your veins. If you have a catheter to drain urine from your bladder. If you are on a ventilator. A ventilator is a machine that helps you breathe by moving air in and out of your lungs. After surgery. What are the risks? Risks of using CHG include: A skin reaction. Hearing loss, if CHG gets in your ears and you have a perforated eardrum. Eye injury, if CHG gets in your eyes and is not rinsed out. The CHG product catching fire. Make sure that you avoid smoking and flames after applying CHG to your skin. Do not use CHG: If you have a chlorhexidine allergy or have previously reacted to chlorhexidine. On babies younger than 27 months of age. How to use CHG solution Use CHG only as told by your health care provider, and follow the instructions on the label. Use the full amount of CHG as directed. Usually, this is one bottle. During a shower Follow these steps when using CHG solution during a shower (unless your health care provider gives you different instructions): Start the shower. Use your normal soap and shampoo to wash your face and hair. Turn off the shower or move out of the shower stream. Pour the CHG onto a clean washcloth. Do not use any type of brush or rough-edged sponge. Starting at your  neck, lather your body down to your toes. Make sure you follow these instructions: If you will be having surgery, pay special attention to the part of your body where you will be having surgery. Scrub this area for at least 1  minute. Do not use CHG on your head or face. If the solution gets into your ears or eyes, rinse them well with water. Avoid your genital area. Avoid any areas of skin that have broken skin, cuts, or scrapes. Scrub your back and under your arms. Make sure to wash skin folds. Let the lather sit on your skin for 1-2 minutes or as long as told by your health care provider. Thoroughly rinse your entire body in the shower. Make sure that all body creases and crevices are rinsed well. Dry off with a clean towel. Do not put any substances on your body afterward--such as powder, lotion, or perfume--unless you are told to do so by your health care provider. Only use lotions that are recommended by the manufacturer. Put on clean clothes or pajamas. If it is the night before your surgery, sleep in clean sheets.  During a sponge bath Follow these steps when using CHG solution during a sponge bath (unless your health care provider gives you different instructions): Use your normal soap and shampoo to wash your face and hair. Pour the CHG onto a clean washcloth. Starting at your neck, lather your body down to your toes. Make sure you follow these instructions: If you will be having surgery, pay special attention to the part of your body where you will be having surgery. Scrub this area for at least 1 minute. Do not use CHG on your head or face. If the solution gets into your ears or eyes, rinse them well with water. Avoid your genital area. Avoid any areas of skin that have broken skin, cuts, or scrapes. Scrub your back and under your arms. Make sure to wash skin folds. Let the lather sit on your skin for 1-2 minutes or as long as told by your health care provider. Using a different clean, wet washcloth, thoroughly rinse your entire body. Make sure that all body creases and crevices are rinsed well. Dry off with a clean towel. Do not put any substances on your body afterward--such as powder, lotion, or  perfume--unless you are told to do so by your health care provider. Only use lotions that are recommended by the manufacturer. Put on clean clothes or pajamas. If it is the night before your surgery, sleep in clean sheets. How to use CHG prepackaged cloths Only use CHG cloths as told by your health care provider, and follow the instructions on the label. Use the CHG cloth on clean, dry skin. Do not use the CHG cloth on your head or face unless your health care provider tells you to. When washing with the CHG cloth: Avoid your genital area. Avoid any areas of skin that have broken skin, cuts, or scrapes. Before surgery Follow these steps when using a CHG cloth to clean before surgery (unless your health care provider gives you different instructions): Using the CHG cloth, vigorously scrub the part of your body where you will be having surgery. Scrub using a back-and-forth motion for 3 minutes. The area on your body should be completely wet with CHG when you are done scrubbing. Do not rinse. Discard the cloth and let the area air-dry. Do not put any substances on the area afterward, such as powder, lotion, or perfume. Put  on clean clothes or pajamas. If it is the night before your surgery, sleep in clean sheets.  For general bathing Follow these steps when using CHG cloths for general bathing (unless your health care provider gives you different instructions). Use a separate CHG cloth for each area of your body. Make sure you wash between any folds of skin and between your fingers and toes. Wash your body in the following order, switching to a new cloth after each step: The front of your neck, shoulders, and chest. Both of your arms, under your arms, and your hands. Your stomach and groin area, avoiding the genitals. Your right leg and foot. Your left leg and foot. The back of your neck, your back, and your buttocks. Do not rinse. Discard the cloth and let the area air-dry. Do not put any  substances on your body afterward--such as powder, lotion, or perfume--unless you are told to do so by your health care provider. Only use lotions that are recommended by the manufacturer. Put on clean clothes or pajamas. Contact a health care provider if: Your skin gets irritated after scrubbing. You have questions about using your solution or cloth. You swallow any chlorhexidine. Call your local poison control center (1-(574) 809-5820 in the [place] Get help right away if: Your eyes itch badly, or they become very red or swollen. Your skin itches badly and is red or swollen. Your hearing changes. You have trouble seeing. You have swelling or tingling in your mouth or throat. You have trouble breathing. These symptoms may represent a serious problem that is an emergency. Do not wait to see if the symptoms will go away. Get medical help right away. Call your local emergency services (911 in the [place] Do not drive yourself to the hospital. Summary Chlorhexidine gluconate (CHG) is a germ-killing (antiseptic) solution that is used to clean the skin. Cleaning your skin with CHG may help to lower your risk for infection. You may be given CHG to use for bathing. It may be in a bottle or in a prepackaged cloth to use on your skin. Carefully follow your health care provider's instructions and the instructions on the product label. Do not use CHG if you have a chlorhexidine allergy. Contact your health care provider if your skin gets irritated after scrubbing. This information is not intended to replace advice given to you by your health care provider. Make sure you discuss any questions you have with your health care provider. Document Revised: 10/29/2020 Document Reviewed: 10/29/2020 Elsevier Patient Education  2022 [employer].

## 2021-05-10 ENCOUNTER — Encounter (HOSPITAL_COMMUNITY)
Admission: RE | Admit: 2021-05-10 | Discharge: 2021-05-10 | Disposition: A | Discharge status: Home / Self Care | Payer: PPO | Source: Ambulatory Visit | Attending: Orthopedic Surgery | Admitting: Orthopedic Surgery

## 2021-05-10 ENCOUNTER — Other Ambulatory Visit: Payer: Self-pay

## 2021-05-10 ENCOUNTER — Other Ambulatory Visit (HOSPITAL_COMMUNITY)
Admission: RE | Admit: 2021-05-10 | Discharge: 2021-05-10 | Disposition: A | Discharge status: Home / Self Care | Payer: PPO | Source: Ambulatory Visit | Attending: Orthopedic Surgery | Admitting: Orthopedic Surgery

## 2021-05-10 VITALS — BP 133/70 | HR 79 | Temp 98.6°F | Resp 18 | Ht 66.0 in | Wt 220.0 lb

## 2021-05-10 DIAGNOSIS — I4510 Unspecified right bundle-branch block: Secondary | ICD-10-CM | POA: Diagnosis not present

## 2021-05-10 DIAGNOSIS — Z20822 Contact with and (suspected) exposure to covid-19: Secondary | ICD-10-CM | POA: Insufficient documentation

## 2021-05-10 DIAGNOSIS — Z01818 Encounter for other preprocedural examination: Secondary | ICD-10-CM | POA: Diagnosis not present

## 2021-05-10 DIAGNOSIS — R7303 Prediabetes: Secondary | ICD-10-CM

## 2021-05-10 LAB — SARS CORONAVIRUS 2 (TAT 6-24 HRS): SARS Coronavirus 2: NEGATIVE

## 2021-05-10 LAB — BASIC METABOLIC PANEL
Anion gap: 10 (ref 5–15)
BUN: 13 mg/dL (ref 8–23)
CO2: 23 mmol/L (ref 22–32)
Calcium: 8.5 mg/dL — ABNORMAL LOW (ref 8.9–10.3)
Chloride: 103 mmol/L (ref 98–111)
Creatinine, Ser: 0.82 mg/dL (ref 0.61–1.24)
GFR, Estimated: >60 mL/min (ref >60)
Glucose, Bld: 129 mg/dL — ABNORMAL HIGH (ref 70–99)
Potassium: 4.2 mmol/L (ref 3.5–5.1)
Sodium: 136 mmol/L (ref 135–145)

## 2021-05-10 LAB — PREPARE RBC (CROSSMATCH)

## 2021-05-10 LAB — CBC WITH DIFFERENTIAL/PLATELET
Abs Immature Granulocytes: 0.06 10*3/uL (ref 0.00–0.07)
Basophils Absolute: 0 10*3/uL (ref 0.0–0.1)
Basophils Relative: 0 %
Eosinophils Absolute: 0 10*3/uL (ref 0.0–0.5)
Eosinophils Relative: 0 %
HCT: 42.4 % (ref 39.0–52.0)
Hemoglobin: 14.2 g/dL (ref 13.0–17.0)
Immature Granulocytes: 1 %
Lymphocytes Relative: 30 %
Lymphs Abs: 2.2 10*3/uL (ref 0.7–4.0)
MCH: 28.7 pg (ref 26.0–34.0)
MCHC: 33.5 g/dL (ref 30.0–36.0)
MCV: 85.7 fL (ref 80.0–100.0)
Monocytes Absolute: 0.4 10*3/uL (ref 0.1–1.0)
Monocytes Relative: 6 %
Neutro Abs: 4.5 10*3/uL (ref 1.7–7.7)
Neutrophils Relative %: 63 %
Platelets: 251 10*3/uL (ref 150–400)
RBC: 4.95 MIL/uL (ref 4.22–5.81)
RDW: 14.2 % (ref 11.5–15.5)
WBC: 7.2 10*3/uL (ref 4.0–10.5)
nRBC: 0 % (ref 0.0–0.2)

## 2021-05-10 LAB — HEMOGLOBIN A1C
Hgb A1c MFr Bld: 5.9 % — ABNORMAL HIGH (ref 4.8–5.6)
Mean Plasma Glucose: 122.63 mg/dL

## 2021-05-10 LAB — TYPE AND SCREEN
ABO/RH(D): B POS
Antibody Screen: NEGATIVE

## 2021-05-13 NOTE — H&P (Signed)
TOTAL KNEE ADMISSION H&P  Patient is being admitted for right total knee arthroplasty.  Subjective:  Chief Complaint:right knee pain.  HPI: Christopher [name], 65 y.o. male, has a history of pain and functional disability in the right knee due to degenerative arthritis  He has had pain for several years but his pain worsened this year and he was found to have a stress fracture as well as worsening osteoarthritis.  After period of immobilization and opioid therapy he improved slightly but his functional activities did not improve and his pain did not improve enough to improve his quality of life he now presents for right total knee   Patient Active Problem List   Diagnosis Date Noted   Renal cyst 10/03/2020   Frequency of micturition 10/03/2020   Benign prostatic hyperplasia with urinary obstruction 10/03/2020   IBS (irritable bowel syndrome) 07/05/2020   Lumbar disc herniation 06/29/2019   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HCC)    Primary osteoarthritis, left shoulder    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   History of colonic polyps 11/26/2017   S/P total knee replacement, left 06/16/17 06/16/2017   Primary osteoarthritis of left knee    Hyperlipidemia 09/04/2016   Angina pectoris ([place]) 09/04/2016   OSA (obstructive sleep apnea) 09/04/2016   COPD (chronic obstructive pulmonary disease) ([place]) 09/04/2016   Rotator cuff tear 05/05/2014   S/P shoulder surgery 09/26/2013   Arthritis, shoulder region 09/26/2013   Bursitis, shoulder 09/26/2013   Synovitis of shoulder 09/26/2013   Labral tear of shoulder, degenerative 09/26/2013   Biceps tendon tear 09/26/2013   Rotator cuff syndrome of left shoulder 06/21/2013   Arthritis 06/21/2013   Patellar tendinitis 03/29/2013   Effusion of knee joint 03/29/2013   Bursitis/tendonitis, shoulder 03/10/2013   Effusion of knee joint, left 08/18/2011   Knee pain 08/18/2011   Acute torn meniscus 07/30/2011   Old torn meniscus of knee 07/30/2011    ARTHRITIS, LEFT KNEE 10/15/2010   MEDIAL MENISCUS TEAR, RIGHT 10/15/2010   HIP PAIN 01/15/2010   DEGENERATIVE DISC DISEASE, LUMBOSACRAL SPINE W/RADICULOPATHY 0000000   PLICA SYNDROME 0000000   DERANGEMENT MENISCUS 07/09/2009   JOINT EFFUSION, LEFT KNEE 07/09/2009   Unilateral primary osteoarthritis, left knee 01/30/2009   KNEE PAIN 01/30/2009   ANKLE SPRAIN, RIGHT 11/08/2007   Past Medical History:  Diagnosis Date   Anxiety    Arthritis    Asthma    BPH (benign prostatic hyperplasia)    Complication of anesthesia    pt had a hard time being able to move after spinal anesthesia , 3-4 hours   Depression    GERD (gastroesophageal reflux disease)    Gout    no meds   Headache(784.0)    otc meds prn   Heart murmur    dx as a child, no problems as an adult   Hyperlipidemia    IBS (irritable bowel syndrome)    PONV (postoperative nausea and vomiting)    Pre-diabetes    Borderline, diet and exercise, no med   Sleep apnea    uses CIPAP machine at night   Wears partial dentures    bottom partial    Past Surgical History:  Procedure Laterality Date   APPENDECTOMY     BACK SURGERY  2002   neck and back fusion   BIOPSY  12/27/2015   Procedure: BIOPSY;  Surgeon: Rogene Houston, MD;  [facility];  Service: Endoscopy;;  Fundus biopsies and duodenal biopsies   BIOPSY  02/10/2020  Procedure: BIOPSY;  Surgeon: Rogene Houston, MD;  [facility];  Service: Endoscopy;;  antral   CARDIAC CATHETERIZATION     CARDIAC CATHETERIZATION N/A 09/04/2016   Procedure: Right/Left Heart Cath and Coronary Angiography;  Surgeon: Peter M Martinique, MD;  Location: [facility];  Service: Cardiovascular;  Laterality: N/A;   CHOLECYSTECTOMY     CHONDROPLASTY  08/15/2011   Procedure: CHONDROPLASTY;  Surgeon: [name], MD;  Location: [facility];  Service: Orthopedics;  Laterality: Left;   COLONOSCOPY  06/27/2011   Procedure: COLONOSCOPY;  Surgeon: Rogene Houston,  MD;  [facility];  Service: Endoscopy;  Laterality: N/A;  9:00 / Pt to be here at 9am for 10:45 procedure, benign polyps removed   COLONOSCOPY N/A 10/05/2014   Procedure: COLONOSCOPY;  Surgeon: Rogene Houston, MD;  [facility];  Service: Endoscopy;  Laterality: N/A;  930   COLONOSCOPY N/A 02/11/2018   Procedure: COLONOSCOPY;  Surgeon: Rogene Houston, MD;  [facility];  Service: Endoscopy;  Laterality: N/A;  830   ESOPHAGOGASTRODUODENOSCOPY N/A 12/27/2015   Procedure: ESOPHAGOGASTRODUODENOSCOPY (EGD);  Surgeon: Rogene Houston, MD;  [facility];  Service: Endoscopy;  Laterality: N/A;  3:00   ESOPHAGOGASTRODUODENOSCOPY (EGD) WITH PROPOFOL N/A 02/10/2020   Procedure: ESOPHAGOGASTRODUODENOSCOPY (EGD) WITH PROPOFOL;  Surgeon: Rogene Houston, MD;  [facility];  Service: Endoscopy;  Laterality: N/A;  155   HERNIA REPAIR     umbilical hernia   JOINT REPLACEMENT     knee and shoulder   KNEE ARTHROSCOPY     left knee   KNEE ARTHROSCOPY     right knee    LUMBAR LAMINECTOMY/DECOMPRESSION MICRODISCECTOMY  09/14/2012   Procedure: LUMBAR LAMINECTOMY/DECOMPRESSION MICRODISCECTOMY 1 LEVEL;  Surgeon: Floyce Stakes, MD;  Location: [facility];  Service: Neurosurgery;  Laterality: Right;  Right Lumbar three-four Diskectomy   neck fusion  2005   POLYPECTOMY  02/11/2018   Procedure: POLYPECTOMY;  Surgeon: Rogene Houston, MD;  [facility];  Service: Endoscopy;;  colon   REVISION TOTAL SHOULDER TO REVERSE TOTAL SHOULDER Left 12/16/2018   Procedure: REVISION TOTAL SHOULDER TO REVERSE TOTAL SHOULDER;  Surgeon: Meredith Pel, MD;  Location: [facility];  Service: Orthopedics;  Laterality: Left;   SHOULDER ARTHROSCOPY WITH BICEPSTENOTOMY Left 09/23/2013   Procedure: SHOULDER ARTHROSCOPY WITH BICEPSTENOTOMY AND EXTENSIVE DEBRIDEMENT;  Surgeon: Carole Civil, MD;  [facility];  Service: Orthopedics;  Laterality: Left;   SHOULDER  ARTHROSCOPY WITH ROTATOR CUFF REPAIR Left 05/05/2014   Procedure: SHOULDER ARTHROSCOPY LIMITED DEBRIDEMENT;  Surgeon: Carole Civil, MD;  [facility];  Service: Orthopedics;  Laterality: Left;   SHOULDER OPEN ROTATOR CUFF REPAIR Left 05/05/2014   Procedure: ROTATOR CUFF REPAIR SHOULDER OPEN;  Surgeon: Carole Civil, MD;  [facility];  Service: Orthopedics;  Laterality: Left;   SHOULDER OPEN ROTATOR CUFF REPAIR Left 12/16/2018   Procedure: LEFT SHOULDER POSSIBLE SUBSCAPULARIS REPAIR VS. REVISION TO REVERSE TOTAL SHOULDER REPLACEMENT;  Surgeon: Meredith Pel, MD;  Location: [facility];  Service: Orthopedics;  Laterality: Left;   SHOULDER SURGERY Right    Open [place]ford procedure   SPINAL FUSION     x2, 2003 and 2007   TOTAL KNEE ARTHROPLASTY Left 06/16/2017   Procedure: LEFT TOTAL KNEE ARTHROPLASTY;  Surgeon: Carole Civil, MD;  [facility];  Service: Orthopedics;  Laterality: Left;   TOTAL SHOULDER ARTHROPLASTY Left 08/19/2018   Procedure: left shoulder  replacement;  Surgeon: Meredith Pel, MD;  Location: [facility];  Service: Orthopedics;  Laterality: Left;    No current facility-administered medications for this encounter.   Current Outpatient Medications  Medication Sig Dispense Refill Last Dose   benzonatate (TESSALON) 100 MG capsule Take 100 mg by mouth 3 (three) times daily.      EPINEPHrine 0.3 mg/0.3 mL IJ SOAJ injection Inject 0.3 mg into the muscle as needed for anaphylaxis.      HYDROcodone-acetaminophen (NORCO) 10-325 MG tablet Take 1 tablet by mouth every 6 (six) hours as needed. 30 tablet 0    Menthol, Topical Analgesic, (ICY HOT EX) Apply 1 application topically daily as needed (pain).      nitroGLYCERIN (NITROSTAT) 0.4 MG SL tablet PLACE (1) TABLET UNDER TONGUE EVERY 5 MINUTES UP TO (3) DOSES. IF NO RELIEF CALL 911. 25 tablet 2    pantoprazole (PROTONIX) 40 MG tablet TAKE (1) TABLET TWICE A DAY BEFORE MEALS. (Patient taking differently: Take 40 mg  by mouth in the morning.) 60 tablet 0    PROAIR HFA 108 (90 Base) MCG/ACT inhaler Inhale 2 puffs into the lungs every 6 (six) hours as needed for wheezing or shortness of breath.       rosuvastatin (CRESTOR) 10 MG tablet Take 10 mg by mouth daily.      sertraline (ZOLOFT) 50 MG tablet Take 50 mg by mouth in the morning.      temazepam (RESTORIL) 7.5 MG capsule Take 7.5 mg by mouth at bedtime.      Allergies  Allergen Reactions   Celebrex [Celecoxib] Itching, Swelling and Other (See Comments)    All over   Cortisone Swelling    SWELLING REACTION UNSPECIFIED    Doxycycline Swelling and Other (See Comments)    Made tongue turn black    Prednisone Swelling    SWELLING REACTION UNSPECIFIED    Relafen [Nabumetone] Swelling    SWELLING REACTION UNSPECIFIED    Codeine Nausea And Vomiting and Other (See Comments)    Extreme stomach pain. This includes anything with the derivative of codeine in it.   Oxycodone-Acetaminophen Itching and Other (See Comments)    Can not  tolerate with benadryl     Social History   Tobacco Use   Smoking status: Every Day    Packs/day: 1.00    Years: 44.00    Pack years: 44.00    Types: Cigarettes   Smokeless tobacco: Never   Tobacco comments:    smokes a pack a day. since age 22  Substance Use Topics   Alcohol use: No    Alcohol/week: 0.0 standard drinks    Family History  Problem Relation Age of Onset   Alzheimer's disease Father    Diabetes Father    Diabetes Other    Lung disease Other    Arthritis Other    Anesthesia problems Neg Hx    Hypotension Neg Hx    Malignant hyperthermia Neg Hx    Pseudochol deficiency Neg Hx    Sleep apnea Neg Hx      Review of Systems Chronic pain, back pain, knee pain Objective:  Physical Exam  General appearance is normal mild to moderate obesity  Oriented x3  Mood pleasant affect normal  Gait antalgic right lower extremity gait  Inspection of the right knee reveals tenderness of the medial joint  line with slight varus his total range of motion is 115 degrees with a 5 degree flexion contracture  The knee feels stable to  exam strength is normal skin is intact pulses are good lymph nodes are negative sensations intact no pathologic reflexes detected coordination and balance are normal  Vital signs in last 24 hours:    Labs:   Estimated body mass index is 35.51 kg/m as calculated from the following:   Height as of 05/10/21: '5\' 6"'$  (1.676 m).   Weight as of 05/10/21: 99.8 kg.   Imaging Review Plain radiographs demonstrate moderate to severe arthritis with mild varus Assessment/Plan:  End stage arthritis, right knee   The patient history, physical examination, clinical judgment of the provider and imaging studies are consistent with end stage degenerative joint disease of the right knee(s) and total knee arthroplasty is deemed medically necessary. The treatment options including medical management, injection therapy arthroscopy and arthroplasty were discussed at length. The risks and benefits of total knee arthroplasty were presented and reviewed. The risks due to aseptic loosening, infection, stiffness, patella tracking problems, thromboembolic complications and other imponderables were discussed. The patient acknowledged the explanation, agreed to proceed with the plan and consent was signed. Patient is being admitted for inpatient treatment for surgery, pain control, PT, OT, prophylactic antibiotics, VTE prophylaxis, progressive ambulation and ADL's and discharge planning. The patient is planning to be discharged home with home health services after postop day 1

## 2021-05-14 ENCOUNTER — Observation Stay (HOSPITAL_COMMUNITY)
Admission: RE | Admit: 2021-05-14 | Discharge: 2021-05-15 | Disposition: A | Discharge status: Home / Self Care | Payer: PPO | Attending: Orthopedic Surgery | Admitting: Orthopedic Surgery

## 2021-05-14 ENCOUNTER — Encounter (HOSPITAL_COMMUNITY): Payer: Self-pay | Admitting: Orthopedic Surgery

## 2021-05-14 ENCOUNTER — Ambulatory Visit (HOSPITAL_COMMUNITY): Payer: PPO

## 2021-05-14 ENCOUNTER — Encounter (HOSPITAL_COMMUNITY)
Admission: RE | Disposition: A | Discharge status: Home / Self Care | Payer: Self-pay | Source: Home / Self Care | Attending: Orthopedic Surgery

## 2021-05-14 ENCOUNTER — Ambulatory Visit (HOSPITAL_COMMUNITY): Payer: PPO | Admitting: Certified Registered Nurse Anesthetist

## 2021-05-14 ENCOUNTER — Telehealth: Payer: Self-pay | Admitting: Orthopedic Surgery

## 2021-05-14 ENCOUNTER — Other Ambulatory Visit: Payer: Self-pay

## 2021-05-14 DIAGNOSIS — Z96612 Presence of left artificial shoulder joint: Secondary | ICD-10-CM | POA: Diagnosis not present

## 2021-05-14 DIAGNOSIS — Z96651 Presence of right artificial knee joint: Secondary | ICD-10-CM | POA: Diagnosis not present

## 2021-05-14 DIAGNOSIS — F17210 Nicotine dependence, cigarettes, uncomplicated: Secondary | ICD-10-CM | POA: Insufficient documentation

## 2021-05-14 DIAGNOSIS — M1711 Unilateral primary osteoarthritis, right knee: Secondary | ICD-10-CM | POA: Diagnosis not present

## 2021-05-14 DIAGNOSIS — Z471 Aftercare following joint replacement surgery: Secondary | ICD-10-CM | POA: Diagnosis not present

## 2021-05-14 DIAGNOSIS — J449 Chronic obstructive pulmonary disease, unspecified: Secondary | ICD-10-CM | POA: Diagnosis not present

## 2021-05-14 DIAGNOSIS — J45909 Unspecified asthma, uncomplicated: Secondary | ICD-10-CM | POA: Diagnosis not present

## 2021-05-14 DIAGNOSIS — Z96652 Presence of left artificial knee joint: Secondary | ICD-10-CM | POA: Diagnosis not present

## 2021-05-14 DIAGNOSIS — Z79899 Other long term (current) drug therapy: Secondary | ICD-10-CM | POA: Diagnosis not present

## 2021-05-14 HISTORY — PX: TOTAL KNEE ARTHROPLASTY: SHX125

## 2021-05-14 LAB — GLUCOSE, CAPILLARY: Glucose-Capillary: 153 mg/dL — ABNORMAL HIGH (ref 70–99)

## 2021-05-14 SURGERY — ARTHROPLASTY, KNEE, TOTAL
Anesthesia: Spinal | Site: Knee | Laterality: Right

## 2021-05-14 MED ORDER — CEFAZOLIN SODIUM-DEXTROSE 2-4 GM/100ML-% IV SOLN
INTRAVENOUS | Status: AC
  Filled 2021-05-14: qty 100

## 2021-05-14 MED ORDER — ONDANSETRON HCL 4 MG/2ML IJ SOLN
INTRAMUSCULAR | Status: AC
  Filled 2021-05-14: qty 2

## 2021-05-14 MED ORDER — BENZONATATE 100 MG PO CAPS
100.0000 mg | ORAL_CAPSULE | Freq: Three times a day (TID) | ORAL | Status: DC
  Administered 2021-05-14 – 2021-05-15 (×3): 100 mg via ORAL
  Filled 2021-05-14 (×3): qty 1

## 2021-05-14 MED ORDER — MIDAZOLAM HCL 5 MG/5ML IJ SOLN
INTRAMUSCULAR | Status: DC | PRN
  Administered 2021-05-14: 1 mg via INTRAVENOUS

## 2021-05-14 MED ORDER — MENTHOL 3 MG MT LOZG: 1.0000 | LOZENGE | OROMUCOSAL | Status: DC | PRN

## 2021-05-14 MED ORDER — ACETAMINOPHEN 325 MG PO TABS: 325.0000 mg | ORAL_TABLET | Freq: Four times a day (QID) | ORAL | Status: DC | PRN

## 2021-05-14 MED ORDER — TEMAZEPAM 7.5 MG PO CAPS
7.5000 mg | ORAL_CAPSULE | Freq: Every day | ORAL | Status: DC
  Administered 2021-05-14: 7.5 mg via ORAL
  Filled 2021-05-14: qty 1

## 2021-05-14 MED ORDER — ROPIVACAINE HCL 5 MG/ML IJ SOLN
INTRAMUSCULAR | Status: AC
  Filled 2021-05-14: qty 30

## 2021-05-14 MED ORDER — TRAMADOL HCL 50 MG PO TABS
50.0000 mg | ORAL_TABLET | Freq: Four times a day (QID) | ORAL | Status: DC
  Administered 2021-05-14 – 2021-05-15 (×4): 50 mg via ORAL
  Filled 2021-05-14 (×4): qty 1

## 2021-05-14 MED ORDER — SODIUM CHLORIDE (PF) 0.9 % IJ SOLN
INTRAMUSCULAR | Status: AC
  Filled 2021-05-14: qty 10

## 2021-05-14 MED ORDER — MIDAZOLAM HCL 2 MG/2ML IJ SOLN
INTRAMUSCULAR | Status: AC
  Filled 2021-05-14: qty 2

## 2021-05-14 MED ORDER — LACTATED RINGERS IV SOLN: INTRAVENOUS | Status: DC

## 2021-05-14 MED ORDER — BUPIVACAINE LIPOSOME 1.3 % IJ SUSP
INTRAMUSCULAR | Status: AC
  Filled 2021-05-14: qty 20

## 2021-05-14 MED ORDER — DOCUSATE SODIUM 100 MG PO CAPS
100.0000 mg | ORAL_CAPSULE | Freq: Two times a day (BID) | ORAL | Status: DC
  Administered 2021-05-14 – 2021-05-15 (×2): 100 mg via ORAL
  Filled 2021-05-14 (×2): qty 1

## 2021-05-14 MED ORDER — PROPOFOL 10 MG/ML IV BOLUS
INTRAVENOUS | Status: DC | PRN
  Administered 2021-05-14: 200 mg via INTRAVENOUS
  Administered 2021-05-14: 30 mg via INTRAVENOUS
  Administered 2021-05-14: 20 mg via INTRAVENOUS

## 2021-05-14 MED ORDER — POLYETHYLENE GLYCOL 3350 17 G PO PACK
17.0000 g | PACK | Freq: Every day | ORAL | Status: DC
  Administered 2021-05-15: 17 g via ORAL
  Filled 2021-05-14 (×3): qty 1

## 2021-05-14 MED ORDER — ALBUTEROL SULFATE (2.5 MG/3ML) 0.083% IN NEBU: 3.0000 mL | INHALATION_SOLUTION | Freq: Four times a day (QID) | RESPIRATORY_TRACT | Status: DC | PRN

## 2021-05-14 MED ORDER — ALUM & MAG HYDROXIDE-SIMETH 200-200-20 MG/5ML PO SUSP: 30.0000 mL | ORAL | Status: DC | PRN

## 2021-05-14 MED ORDER — NITROGLYCERIN 0.4 MG SL SUBL: 0.4000 mg | SUBLINGUAL_TABLET | SUBLINGUAL | Status: DC | PRN

## 2021-05-14 MED ORDER — POLYETHYLENE GLYCOL 3350 17 G PO PACK
17.0000 g | PACK | Freq: Every day | ORAL | Status: DC
  Filled 2021-05-14 (×2): qty 1

## 2021-05-14 MED ORDER — CHLORHEXIDINE GLUCONATE 0.12 % MT SOLN: 15.0000 mL | Freq: Once | OROMUCOSAL | Status: AC

## 2021-05-14 MED ORDER — ACETAMINOPHEN 500 MG PO TABS
1000.0000 mg | ORAL_TABLET | Freq: Four times a day (QID) | ORAL | Status: AC
  Administered 2021-05-14 – 2021-05-15 (×3): 1000 mg via ORAL
  Filled 2021-05-14 (×4): qty 2

## 2021-05-14 MED ORDER — CEFAZOLIN SODIUM-DEXTROSE 2-4 GM/100ML-% IV SOLN
2.0000 g | INTRAVENOUS | Status: AC
  Administered 2021-05-14: 2 g via INTRAVENOUS

## 2021-05-14 MED ORDER — PHENOL 1.4 % MT LIQD: 1.0000 | OROMUCOSAL | Status: DC | PRN

## 2021-05-14 MED ORDER — SODIUM CHLORIDE 0.9 % IV SOLN: INTRAVENOUS | Status: AC

## 2021-05-14 MED ORDER — PROPOFOL 500 MG/50ML IV EMUL
INTRAVENOUS | Status: DC | PRN
  Administered 2021-05-14: 50 ug/kg/min via INTRAVENOUS

## 2021-05-14 MED ORDER — METOCLOPRAMIDE HCL 5 MG PO TABS
5.0000 mg | ORAL_TABLET | Freq: Three times a day (TID) | ORAL | Status: DC | PRN
  Filled 2021-05-14: qty 2

## 2021-05-14 MED ORDER — SODIUM CHLORIDE (PF) 0.9 % IJ SOLN
INTRAMUSCULAR | Status: DC | PRN
  Administered 2021-05-14: 10 mL

## 2021-05-14 MED ORDER — HYDROMORPHONE HCL 1 MG/ML IJ SOLN
0.5000 mg | INTRAMUSCULAR | Status: DC | PRN
  Administered 2021-05-14 – 2021-05-15 (×3): 1 mg via INTRAVENOUS
  Filled 2021-05-14 (×3): qty 1

## 2021-05-14 MED ORDER — PANTOPRAZOLE SODIUM 40 MG PO TBEC: 40.0000 mg | DELAYED_RELEASE_TABLET | Freq: Every morning | ORAL | Status: DC

## 2021-05-14 MED ORDER — ONDANSETRON HCL 4 MG/2ML IJ SOLN: 4.0000 mg | Freq: Four times a day (QID) | INTRAMUSCULAR | Status: DC | PRN

## 2021-05-14 MED ORDER — FENTANYL CITRATE PF 50 MCG/ML IJ SOSY
25.0000 ug | PREFILLED_SYRINGE | INTRAMUSCULAR | Status: DC | PRN
  Administered 2021-05-14 (×4): 50 ug via INTRAVENOUS
  Filled 2021-05-14 (×2): qty 1

## 2021-05-14 MED ORDER — SERTRALINE HCL 50 MG PO TABS
50.0000 mg | ORAL_TABLET | Freq: Every morning | ORAL | Status: DC
  Administered 2021-05-15: 50 mg via ORAL
  Filled 2021-05-14: qty 1

## 2021-05-14 MED ORDER — METHOCARBAMOL 500 MG PO TABS
500.0000 mg | ORAL_TABLET | Freq: Four times a day (QID) | ORAL | Status: DC | PRN
  Administered 2021-05-14: 500 mg via ORAL
  Filled 2021-05-14: qty 1

## 2021-05-14 MED ORDER — METHOCARBAMOL 1000 MG/10ML IJ SOLN
500.0000 mg | Freq: Once | INTRAVENOUS | Status: AC
  Administered 2021-05-14: 500 mg via INTRAVENOUS
  Filled 2021-05-14: qty 500

## 2021-05-14 MED ORDER — FENTANYL CITRATE (PF) 100 MCG/2ML IJ SOLN
INTRAMUSCULAR | Status: AC
  Filled 2021-05-14: qty 2

## 2021-05-14 MED ORDER — PHENYLEPHRINE HCL-NACL 20-0.9 MG/250ML-% IV SOLN
INTRAVENOUS | Status: AC
  Filled 2021-05-14: qty 250

## 2021-05-14 MED ORDER — SODIUM CHLORIDE 0.9 % IR SOLN
Status: DC | PRN
  Administered 2021-05-14: 3000 mL

## 2021-05-14 MED ORDER — FENTANYL CITRATE (PF) 100 MCG/2ML IJ SOLN
INTRAMUSCULAR | Status: DC | PRN
  Administered 2021-05-14: 50 ug via INTRAVENOUS

## 2021-05-14 MED ORDER — PHENYLEPHRINE HCL-NACL 20-0.9 MG/250ML-% IV SOLN
INTRAVENOUS | Status: DC | PRN
  Administered 2021-05-14: 30 ug/min via INTRAVENOUS

## 2021-05-14 MED ORDER — ONDANSETRON HCL 4 MG PO TABS
4.0000 mg | ORAL_TABLET | Freq: Four times a day (QID) | ORAL | Status: DC | PRN
  Filled 2021-05-14: qty 1

## 2021-05-14 MED ORDER — METHOCARBAMOL 1000 MG/10ML IJ SOLN: 500.0000 mg | Freq: Four times a day (QID) | INTRAVENOUS | Status: DC | PRN

## 2021-05-14 MED ORDER — LIDOCAINE HCL (PF) 2 % IJ SOLN
INTRAMUSCULAR | Status: AC
  Filled 2021-05-14: qty 5

## 2021-05-14 MED ORDER — OXYCODONE HCL 5 MG PO TABS
5.0000 mg | ORAL_TABLET | Freq: Once | ORAL | Status: AC
  Administered 2021-05-14: 5 mg via ORAL
  Filled 2021-05-14: qty 1

## 2021-05-14 MED ORDER — METOCLOPRAMIDE HCL 5 MG/ML IJ SOLN: 5.0000 mg | Freq: Three times a day (TID) | INTRAMUSCULAR | Status: DC | PRN

## 2021-05-14 MED ORDER — ROSUVASTATIN CALCIUM 10 MG PO TABS
10.0000 mg | ORAL_TABLET | Freq: Every day | ORAL | Status: DC
  Administered 2021-05-15: 10 mg via ORAL
  Filled 2021-05-14: qty 1

## 2021-05-14 MED ORDER — ONDANSETRON HCL 4 MG/2ML IJ SOLN
INTRAMUSCULAR | Status: DC | PRN
  Administered 2021-05-14: 4 mg via INTRAVENOUS

## 2021-05-14 MED ORDER — BUPIVACAINE IN DEXTROSE 0.75-8.25 % IT SOLN
INTRATHECAL | Status: DC | PRN
  Administered 2021-05-14: 2 mL via INTRATHECAL

## 2021-05-14 MED ORDER — PHENYLEPHRINE 40 MCG/ML (10ML) SYRINGE FOR IV PUSH (FOR BLOOD PRESSURE SUPPORT)
PREFILLED_SYRINGE | INTRAVENOUS | Status: DC | PRN
  Administered 2021-05-14: 80 ug via INTRAVENOUS

## 2021-05-14 MED ORDER — PHENYLEPHRINE 40 MCG/ML (10ML) SYRINGE FOR IV PUSH (FOR BLOOD PRESSURE SUPPORT)
PREFILLED_SYRINGE | INTRAVENOUS | Status: AC
  Filled 2021-05-14: qty 10

## 2021-05-14 MED ORDER — PREGABALIN 50 MG PO CAPS
50.0000 mg | ORAL_CAPSULE | Freq: Once | ORAL | Status: AC
  Administered 2021-05-14: 50 mg via ORAL
  Filled 2021-05-14: qty 1

## 2021-05-14 MED ORDER — EPHEDRINE SULFATE-NACL 50-0.9 MG/10ML-% IV SOSY
PREFILLED_SYRINGE | INTRAVENOUS | Status: DC | PRN
  Administered 2021-05-14 (×2): 5 mg via INTRAVENOUS

## 2021-05-14 MED ORDER — ICY HOT 10-30 % EX STCK: Freq: Every day | CUTANEOUS | Status: DC | PRN

## 2021-05-14 MED ORDER — TRANEXAMIC ACID-NACL 1000-0.7 MG/100ML-% IV SOLN
1000.0000 mg | INTRAVENOUS | Status: AC
  Administered 2021-05-14: 1000 mg via INTRAVENOUS

## 2021-05-14 MED ORDER — TRANEXAMIC ACID-NACL 1000-0.7 MG/100ML-% IV SOLN
INTRAVENOUS | Status: AC
  Filled 2021-05-14: qty 100

## 2021-05-14 MED ORDER — PANTOPRAZOLE SODIUM 40 MG PO TBEC
40.0000 mg | DELAYED_RELEASE_TABLET | Freq: Every day | ORAL | Status: DC
  Administered 2021-05-15: 40 mg via ORAL
  Filled 2021-05-14: qty 1

## 2021-05-14 MED ORDER — TRANEXAMIC ACID-NACL 1000-0.7 MG/100ML-% IV SOLN: 1000.0000 mg | Freq: Once | INTRAVENOUS | Status: DC

## 2021-05-14 MED ORDER — ORAL CARE MOUTH RINSE: 15.0000 mL | Freq: Once | OROMUCOSAL | Status: AC

## 2021-05-14 MED ORDER — ASPIRIN EC 325 MG PO TBEC
325.0000 mg | DELAYED_RELEASE_TABLET | Freq: Every day | ORAL | Status: DC
  Administered 2021-05-15: 325 mg via ORAL
  Filled 2021-05-14: qty 1

## 2021-05-14 MED ORDER — CEFAZOLIN SODIUM-DEXTROSE 2-4 GM/100ML-% IV SOLN
2.0000 g | Freq: Four times a day (QID) | INTRAVENOUS | Status: AC
  Administered 2021-05-14: 2 g via INTRAVENOUS
  Filled 2021-05-14: qty 100

## 2021-05-14 MED ORDER — OXYCODONE HCL ER 10 MG PO T12A
10.0000 mg | EXTENDED_RELEASE_TABLET | Freq: Two times a day (BID) | ORAL | Status: DC
  Administered 2021-05-14 – 2021-05-15 (×2): 10 mg via ORAL
  Filled 2021-05-14 (×2): qty 1

## 2021-05-14 MED ORDER — PROPOFOL 10 MG/ML IV BOLUS
INTRAVENOUS | Status: AC
  Filled 2021-05-14: qty 40

## 2021-05-14 MED ORDER — OXYCODONE HCL 5 MG PO TABS
10.0000 mg | ORAL_TABLET | ORAL | Status: DC | PRN
  Administered 2021-05-14: 15 mg via ORAL
  Filled 2021-05-14: qty 3

## 2021-05-14 MED ORDER — FENTANYL CITRATE (PF) 100 MCG/2ML IJ SOLN
INTRAMUSCULAR | Status: DC | PRN
  Administered 2021-05-14: 15 ug via INTRATHECAL

## 2021-05-14 MED ORDER — 0.9 % SODIUM CHLORIDE (POUR BTL) OPTIME
TOPICAL | Status: DC | PRN
  Administered 2021-05-14: 1000 mL

## 2021-05-14 MED ORDER — BUPIVACAINE LIPOSOME 1.3 % IJ SUSP
INTRAMUSCULAR | Status: DC | PRN
  Administered 2021-05-14: 20 mL

## 2021-05-14 MED ORDER — DIPHENHYDRAMINE HCL 50 MG/ML IJ SOLN
25.0000 mg | Freq: Once | INTRAMUSCULAR | Status: AC
  Administered 2021-05-14: 25 mg via INTRAVENOUS
  Filled 2021-05-14: qty 1

## 2021-05-14 MED ORDER — OXYCODONE HCL 5 MG PO TABS: 5.0000 mg | ORAL_TABLET | ORAL | Status: DC | PRN

## 2021-05-14 MED ORDER — FENTANYL CITRATE PF 50 MCG/ML IJ SOSY
PREFILLED_SYRINGE | INTRAMUSCULAR | Status: AC
  Filled 2021-05-14: qty 2

## 2021-05-14 MED ORDER — ONDANSETRON HCL 4 MG/2ML IJ SOLN
4.0000 mg | Freq: Once | INTRAMUSCULAR | Status: AC
  Administered 2021-05-14: 4 mg via INTRAVENOUS
  Filled 2021-05-14: qty 2

## 2021-05-14 MED ORDER — DIPHENHYDRAMINE HCL 12.5 MG/5ML PO ELIX
12.5000 mg | ORAL_SOLUTION | ORAL | Status: DC | PRN
  Administered 2021-05-14 – 2021-05-15 (×2): 25 mg via ORAL
  Filled 2021-05-14 (×2): qty 10

## 2021-05-14 MED ORDER — EPINEPHRINE 0.3 MG/0.3ML IJ SOAJ: 0.3000 mg | INTRAMUSCULAR | Status: DC | PRN

## 2021-05-14 MED ORDER — CHLORHEXIDINE GLUCONATE 0.12 % MT SOLN
OROMUCOSAL | Status: AC
  Administered 2021-05-14: 15 mL via OROMUCOSAL
  Filled 2021-05-14: qty 15

## 2021-05-14 MED ORDER — EPHEDRINE 5 MG/ML INJ
INTRAVENOUS | Status: AC
  Filled 2021-05-14: qty 5

## 2021-05-14 SURGICAL SUPPLY — 64 items
ATTUNE PS FEM RT SZ 5 CEM KNEE (Femur) ×2 IMPLANT
BANDAGE ESMARK 6X9 LF (GAUZE/BANDAGES/DRESSINGS) ×1 IMPLANT
BASE TIBIAL CEM ATTUNE SZ 7 (Knees) ×2 IMPLANT
BASEPLATE TIB CEM ATTUNE SZ7 (Knees) ×1 IMPLANT
BLADE SAGITTAL 25.0X1.27X90 (BLADE) ×2 IMPLANT
BLADE SAW SGTL 11.0X1.19X90.0M (BLADE) ×2 IMPLANT
BLADE SURG SZ10 CARB STEEL (BLADE) ×2 IMPLANT
BNDG ELASTIC 4X5.8 VLCR NS LF (GAUZE/BANDAGES/DRESSINGS) ×4 IMPLANT
BNDG ELASTIC 6X5.8 VLCR NS LF (GAUZE/BANDAGES/DRESSINGS) ×2 IMPLANT
BNDG ESMARK 6X9 LF (GAUZE/BANDAGES/DRESSINGS) ×2
CEMENT HV SMART SET (Cement) ×4 IMPLANT
CLOTH BEACON ORANGE TIMEOUT ST (SAFETY) ×2 IMPLANT
COOLER ICEMAN CLASSIC (MISCELLANEOUS) ×2 IMPLANT
COVER LIGHT HANDLE STERIS (MISCELLANEOUS) ×4 IMPLANT
CUFF TOURN SGL QUICK 34 (TOURNIQUET CUFF) ×2
CUFF TRNQT CYL 34X4.125X (TOURNIQUET CUFF) ×1 IMPLANT
DRAPE BACK TABLE (DRAPES) ×2 IMPLANT
DRAPE EXTREMITY T 121X128X90 (DISPOSABLE) ×2 IMPLANT
DRESSING AQUACEL AG ADV 3.5X12 (MISCELLANEOUS) ×1 IMPLANT
DRSG AQUACEL AG ADV 3.5X12 (MISCELLANEOUS) ×2
DURAPREP 26ML APPLICATOR (WOUND CARE) ×4 IMPLANT
ELECT REM PT RETURN 9FT ADLT (ELECTROSURGICAL) ×2
ELECTRODE REM PT RTRN 9FT ADLT (ELECTROSURGICAL) ×1 IMPLANT
GLOVE SS N UNI LF 8.5 STRL (GLOVE) ×2 IMPLANT
GLOVE SURG POLYISO LF SZ7 (GLOVE) ×4 IMPLANT
GLOVE SURG POLYISO LF SZ7.5 (GLOVE) ×2 IMPLANT
GLOVE SURG POLYISO LF SZ8 (GLOVE) ×4 IMPLANT
GLOVE SURG UNDER POLY LF SZ7 (GLOVE) ×6 IMPLANT
GLOVE SURG UNDER POLY LF SZ7.5 (GLOVE) ×4 IMPLANT
GOWN STRL REUS W/TWL LRG LVL3 (GOWN DISPOSABLE) ×6 IMPLANT
GOWN STRL REUS W/TWL XL LVL3 (GOWN DISPOSABLE) ×2 IMPLANT
HANDPIECE INTERPULSE COAX TIP (DISPOSABLE) ×2
HOOD W/PEELAWAY (MISCELLANEOUS) ×8 IMPLANT
INSERT TIB ATTUNE FB SZ5X6 (Insert) ×2 IMPLANT
INST SET MAJOR BONE (KITS) ×2 IMPLANT
IV NS IRRIG 3000ML ARTHROMATIC (IV SOLUTION) ×2 IMPLANT
KIT BLADEGUARD II DBL (SET/KITS/TRAYS/PACK) ×2 IMPLANT
KIT TURNOVER KIT A (KITS) ×2 IMPLANT
MANIFOLD NEPTUNE II (INSTRUMENTS) ×2 IMPLANT
MARKER SKIN DUAL TIP RULER LAB (MISCELLANEOUS) ×2 IMPLANT
NEEDLE HYPO 18GX1.5 BLUNT FILL (NEEDLE) ×2 IMPLANT
NEEDLE HYPO 21X1.5 SAFETY (NEEDLE) ×2 IMPLANT
NS IRRIG 1000ML POUR BTL (IV SOLUTION) ×2 IMPLANT
PACK TOTAL JOINT (CUSTOM PROCEDURE TRAY) ×2 IMPLANT
PAD ARMBOARD 7.5X6 YLW CONV (MISCELLANEOUS) ×2 IMPLANT
PAD COLD SHLDR WRAP-ON (PAD) ×2 IMPLANT
PENCIL SMOKE EVACUATOR (MISCELLANEOUS) ×2 IMPLANT
PILLOW KNEE EXTENSION 0 DEG (MISCELLANEOUS) ×2 IMPLANT
SAW OSC TIP CART 19.5X105X1.3 (SAW) ×2 IMPLANT
SET BASIN LINEN APH (SET/KITS/TRAYS/PACK) ×2 IMPLANT
SET HNDPC FAN SPRY TIP SCT (DISPOSABLE) ×1 IMPLANT
SPONGE T-LAP 18X18 ~~LOC~~+RFID (SPONGE) ×6 IMPLANT
STAPLER VISISTAT 35W (STAPLE) ×2 IMPLANT
SUT BRALON NAB BRD #1 30IN (SUTURE) ×4 IMPLANT
SUT MNCRL 0 VIOLET CTX 36 (SUTURE) ×1 IMPLANT
SUT MON AB 0 CT1 (SUTURE) ×4 IMPLANT
SUT MONOCRYL 0 CTX 36 (SUTURE) ×2
SYR 20ML LL LF (SYRINGE) ×6 IMPLANT
SYR BULB IRRIG 60ML STRL (SYRINGE) ×2 IMPLANT
TOWEL OR 17X26 4PK STRL BLUE (TOWEL DISPOSABLE) ×2 IMPLANT
TOWER CARTRIDGE SMART MIX (DISPOSABLE) ×2 IMPLANT
TRAY FOLEY MTR SLVR 16FR STAT (SET/KITS/TRAYS/PACK) ×2 IMPLANT
WATER STERILE IRR 1000ML POUR (IV SOLUTION) ×4 IMPLANT
YANKAUER SUCT 12FT TUBE ARGYLE (SUCTIONS) ×2 IMPLANT

## 2021-05-14 NOTE — Progress Notes (Signed)
POST OP XRAY REPORT FROM RADIOLOGY DISCUSS POSSIBLE GAP BETWEEN THE IMPLANT AND BONE   AFTER THOROUGH REVIEW THI IS MORE LIKELY PROJECTIONAL AND OF NO CONSEQUENCE TO THE IMPLANT SURVIVAL

## 2021-05-14 NOTE — Transfer of Care (Signed)
Immediate Anesthesia Transfer of Care Note  Patient: Christopher [name]  Procedure(s) Performed: TOTAL KNEE ARTHROPLASTY (Right: Knee)  Patient Location: PACU  Anesthesia Type:General and Spinal  Level of Consciousness: awake, alert  and oriented  Airway & Oxygen Therapy: Patient Spontanous Breathing and Patient connected to face mask oxygen  Post-op Assessment: Report given to RN and Post -op Vital signs reviewed and stable  Post vital signs: Reviewed and stable  Last Vitals:  Vitals Value Taken Time  BP 146/71 05/14/21 0951  Temp 36.4 C 05/14/21 0950  Pulse 76 05/14/21 0959  Resp 13 05/14/21 0959  SpO2 100 % 05/14/21 0959  Vitals shown include unvalidated device data.  Last Pain:  Vitals:   05/14/21 0639  TempSrc: Oral  PainSc: 9       Patients Stated Pain Goal: 9 ([id number])  Complications: No notable events documented.

## 2021-05-14 NOTE — Anesthesia Postprocedure Evaluation (Signed)
Anesthesia Post Note  Patient: Christopher [name]  Procedure(s) Performed: TOTAL KNEE ARTHROPLASTY (Right: Knee)  Patient location during evaluation: Phase II Anesthesia Type: Spinal Level of consciousness: awake Pain management: pain level controlled Vital Signs Assessment: post-procedure vital signs reviewed and stable Respiratory status: spontaneous breathing and respiratory function stable Cardiovascular status: blood pressure returned to baseline and stable Postop Assessment: no headache and no apparent nausea or vomiting Anesthetic complications: no Comments: Late entry   No notable events documented.   Last Vitals:  Vitals:   05/14/21 1145 05/14/21 1215  BP: (!) 124/49 139/75  Pulse: 79 77  Resp: 12 11  Temp: 36.5 C   SpO2: 98% 95%    Last Pain:  Vitals:   05/14/21 1252  TempSrc:   PainSc: 10-Worst pain ever                 Louann Sjogren

## 2021-05-14 NOTE — Progress Notes (Signed)
Pt instructed on incentive spirometer. 2250 mL obtained. Tolerated well.

## 2021-05-14 NOTE — Evaluation (Signed)
Physical Therapy Evaluation Patient Details Name: Christopher [name] MRN: [id number] DOB: May 01, 1956 Today's Date: 05/14/2021  RIGHT KNEE ROM:  3 - 90 degrees AMBULATION DISTANCE:  25 feet using RW with Minimal Guard Assist   History of Present Illness  Christopher [name] is a 65 y/o male, s/p Right TKA on 05/14/21, with the diagnosis of Osteoarthritis knee right.  Clinical Impression  Patient instructed in and demonstrated understanding of HEP with written instructions provided, very reactive to any movement of right knee due to increased pain, able to achieve up to 90 degrees right knee flexion self stretching while seated at bedside, limited to ambulating to doorway and back to bedside with standing rest break mostly due to c/o increasing right knee pain.  Patient tolerated sitting up in chair with RLE dangling after therapy and his spouse present in room - RN aware.  Patient will benefit from continued physical therapy in hospital and recommended venue below to increase strength, balance, endurance for safe ADLs and gait.        Recommendations for follow up therapy are one component of a multi-disciplinary discharge planning process, led by the attending physician.  Recommendations may be updated based on patient status, additional functional criteria and insurance authorization.  Follow Up Recommendations Home health PT;Supervision for mobility/OOB;Supervision - Intermittent    Equipment Recommendations  None recommended by PT    Recommendations for Other Services       Precautions / Restrictions Precautions Precautions: Fall Restrictions Weight Bearing Restrictions: Yes RLE Weight Bearing: Weight bearing as tolerated      Mobility  Bed Mobility Overal bed mobility: Needs Assistance Bed Mobility: Supine to Sit     Supine to sit: Supervision;Min guard     General bed mobility comments: had to use bed rail, increased time, labored movement    Transfers Overall transfer  level: Needs assistance Equipment used: Rolling walker (2 wheeled) Transfers: Sit to/from [facility] Sit to Stand: Min guard Stand pivot transfers: Min guard       General transfer comment: increased time, labored movement, c/o severe pain right knee  Ambulation/Gait Ambulation/Gait assistance: Min guard Gait Distance (Feet): 25 Feet Assistive device: Rolling walker (2 wheeled) Gait Pattern/deviations: Decreased step length - right;Decreased step length - left;Decreased stride length;Antalgic;Decreased stance time - right Gait velocity: decreased   General Gait Details: slow labored cadence with fair/poor return for right heel to toe stepping due to c/o severe right knee pain, had to take standing rest break leaning over RW before returning to bedside  Stairs            Wheelchair Mobility    Modified Rankin (Stroke Patients Only)       Balance Overall balance assessment: Needs assistance Sitting-balance support: Feet supported;No upper extremity supported Sitting balance-Leahy Scale: Good Sitting balance - Comments: seated at EOB   Standing balance support: During functional activity;Bilateral upper extremity supported Standing balance-Leahy Scale: Fair Standing balance comment: using RW                             Pertinent Vitals/Pain Pain Assessment: Faces Faces Pain Scale: Hurts whole lot Pain Location: right knee Pain Descriptors / Indicators: Grimacing;Guarding;Moaning;Sore Pain Intervention(s): Limited activity within patient's tolerance;Monitored during session;Repositioned;Premedicated before session    Van expects to be discharged to:: Private residence Living Arrangements: Spouse/significant other Available Help at Discharge: Family;Available 24 hours/day Type of Home: Mobile home Home Access: Ramped entrance  Home Layout: One level Home Equipment: Walker - 2 wheels;Cane - single  point;Shower seat      Prior Function Level of Independence: Independent         Comments: Hydrographic surveyor, drives     Hand Dominance   Dominant Hand: Right    Extremity/Trunk Assessment   Upper Extremity Assessment Upper Extremity Assessment: Overall WFL for tasks assessed    Lower Extremity Assessment Lower Extremity Assessment: Generalized weakness;RLE deficits/detail RLE Deficits / Details: grossly -4/5 RLE: Unable to fully assess due to pain RLE Sensation: WNL RLE Coordination: WNL    Cervical / Trunk Assessment Cervical / Trunk Assessment: Normal  Communication   Communication: No difficulties  Cognition Arousal/Alertness: Awake/alert Behavior During Therapy: WFL for tasks assessed/performed Overall Cognitive Status: Within Functional Limits for tasks assessed                                        General Comments      Exercises Total Joint Exercises Ankle Circles/Pumps: Supine;10 reps;Right;Strengthening;AROM Quad Sets: Supine;5 reps;Right;Strengthening;AROM Short Arc Quad: Supine;5 reps;Right;Strengthening;AROM Heel Slides: Supine;10 reps;Right;Strengthening;AROM Goniometric ROM: right knee: 3 - 90 degrees   Assessment/Plan    PT Assessment Patient needs continued PT services  PT Problem List Decreased activity tolerance;Decreased strength;Decreased balance;Decreased mobility;Decreased range of motion;Pain       PT Treatment Interventions DME instruction;Gait training;Stair training;Functional mobility training;Therapeutic activities;Therapeutic exercise;Balance training;Patient/family education    PT Goals (Current goals can be found in the Care Plan section)  Acute Rehab PT Goals Patient Stated Goal: return home with family to assist PT Goal Formulation: With patient Time For Goal Achievement: 05/17/21 Potential to Achieve Goals: Good    Frequency BID   Barriers to discharge        Co-evaluation                AM-PAC PT "6 Clicks" Mobility  Outcome Measure Help needed turning from your back to your side while in a flat bed without using bedrails?: None Help needed moving from lying on your back to sitting on the side of a flat bed without using bedrails?: A Little Help needed moving to and from a bed to a chair (including a wheelchair)?: A Little Help needed standing up from a chair using your arms (e.g., wheelchair or bedside chair)?: A Little Help needed to walk in hospital room?: A Little Help needed climbing 3-5 steps with a railing? : A Lot 6 Click Score: 18    End of Session   Activity Tolerance: Patient tolerated treatment well;Patient limited by fatigue;Patient limited by pain Patient left: in chair;with call bell/phone within reach;with family/visitor present Nurse Communication: Mobility status PT Visit Diagnosis: Unsteadiness on feet (R26.81);Other abnormalities of gait and mobility (R26.89);Muscle weakness (generalized) (M62.81)    Time: [id number] PT Time Calculation (min) (ACUTE ONLY): 25 min   Charges:   PT Evaluation $PT Eval Moderate Complexity: 1 Mod PT Treatments $Therapeutic Activity: 23-37 mins        4:04 PM, 05/14/21 Lonell Grandchild, MPT Physical Therapist with [facility] [phone] office [phone] mobile phone

## 2021-05-14 NOTE — Brief Op Note (Signed)
05/14/2021  9:54 AM  PATIENT:  [name]  65 y.o. male  PRE-OPERATIVE DIAGNOSIS:  Osteoarthritis knee right  POST-OPERATIVE DIAGNOSIS:  Osteoarthritis knee right  PROCEDURE:  Procedure(s): TOTAL KNEE ARTHROPLASTY (Right)  SURGEON:  Surgeon(s) and Role:    Carole Civil, MD - Primary  PHYSICIAN ASSISTANT:   ASSISTANTS: Vevelyn Royals  ANESTHESIA:   spinal, general, and postoperative saphenous nerve block  EBL:  25 mL   BLOOD ADMINISTERED:none  DRAINS: none   LOCAL MEDICATIONS USED:  OTHER exparel 20 cc  SPECIMEN:  No Specimen  DISPOSITION OF SPECIMEN:  N/A  COUNTS:  YES  TOURNIQUET:   Total Tourniquet Time Documented: Thigh (Right) - 76 minutes Total: Thigh (Right) - 76 minutes   DICTATION: .Viviann Spare Dictation  PLAN OF CARE: Admit for overnight observation  PATIENT DISPOSITION:  PACU - hemodynamically stable.   Delay start of Pharmacological VTE agent (>24hrs) due to surgical blood loss or risk of bleeding: yes

## 2021-05-14 NOTE — Telephone Encounter (Signed)
Patient's wife Christopher [name]/designated contact on file called with question about medication prescribed today, following surgery. States Dr Aline Brochure also called back to [facility] afterward, and Christopher [name] relays she was then told that they do not have a prescription, and to call back this evening or tomorrow. Please advise.

## 2021-05-14 NOTE — Anesthesia Preprocedure Evaluation (Signed)
Anesthesia Evaluation  Patient identified by MRN, date of birth, ID band Patient awake    Reviewed: Allergy & Precautions, H&P , NPO status , Patient's Chart, lab work & pertinent test results, reviewed documented beta blocker date and time   History of Anesthesia Complications (+) PONV, PROLONGED EMERGENCE and history of anesthetic complications  Airway Mallampati: II  TM Distance: >3 FB Neck ROM: full    Dental no notable dental hx.    Pulmonary asthma , sleep apnea , COPD, Current Smoker and Patient abstained from smoking.,    Pulmonary exam normal breath sounds clear to auscultation       Cardiovascular Exercise Tolerance: Good negative cardio ROS   Rhythm:regular Rate:Normal     Neuro/Psych  Headaches, PSYCHIATRIC DISORDERS Anxiety Depression    GI/Hepatic Neg liver ROS, GERD  Medicated,  Endo/Other  negative endocrine ROS  Renal/GU negative Renal ROS  negative genitourinary   Musculoskeletal   Abdominal   Peds  Hematology negative hematology ROS (+)   Anesthesia Other Findings   Reproductive/Obstetrics negative OB ROS                             Anesthesia Physical Anesthesia Plan  ASA: 3  Anesthesia Plan: Spinal   Post-op Pain Management:    Induction:   PONV Risk Score and Plan: Propofol infusion  Airway Management Planned:   Additional Equipment:   Intra-op Plan:   Post-operative Plan:   Informed Consent: I have reviewed the patients History and Physical, chart, labs and discussed the procedure including the risks, benefits and alternatives for the proposed anesthesia with the patient or authorized representative who has indicated his/her understanding and acceptance.     Dental Advisory Given  Plan Discussed with: CRNA  Anesthesia Plan Comments:         Anesthesia Quick Evaluation

## 2021-05-14 NOTE — Telephone Encounter (Signed)
Will send when he is discharged I left message, but it may not have recorded. Cut me off.

## 2021-05-14 NOTE — Op Note (Signed)
Dictation for total knee replacement  05/14/2021  9:56 AM  Preop diagnosis osteoarthritis right KNEE  Postop diagnosis osteoarthritis right KNEE  Procedure right total knee arthroplasty  Implants   DePuy   Attune, fixed-bearing posterior stabilized total knee: Sizes: Femur   5   tibia 7   patella no patellar resurfacing   POLYthylene 6  Bone cuts:  Distal femur 9  PROXIMAL TIBIA 5 mm from the lower medial side  PATELLA no patella replacement         ASSISTANTS: Vevelyn Royals  ANESTHESIA:   Spinal anesthesia converted to LMA postop saphenous nerve block  BLOOD ADMINISTERED:none  DRAINS: none   LOCAL MEDICATIONS USED:  EXPAREL  SPECIMEN:  No Specimen  DISPOSITION OF SPECIMEN:  N/A  COUNTS:  YES  TOURNIQUET: Yes 280 mmHg for 71 minutes  DICTATION: .Dragon Dictation   The patient was taken to the recovery room in stable condition  PLAN OF CARE: Observation  PATIENT DISPOSITION:  PACU - hemodynamically stable.   Delay start of Pharmacological VTE agent (>24hrs) due to surgical blood loss or risk of bleeding: not applicable  Details of surgery: The patient was identified by 2 approved identification mechanisms. The operative extremity was evaluated and found to be acceptable for surgical treatment today. The chart was reviewed. The surgical site was confirmed and marked.  saphenous nerve block done postoperatively in PACU  The patient was taken to the operating room and given appropriate antibiotic 2 g Ancef. This is consistent with the SCIP protocol.  The patient was given the following anesthetic: Spinal anesthetic converted to general LMA  The patient was then placed supine on the operating table. A Foley catheter was inserted. The operative extremity was prepped and draped sterilely from the toes to the groin.  Timeout was executed confirming the patient's name, surgical site, antibiotic administration, x-rays available, and implants available.  The  operative limb,  was exsanguinated with a six-inch Esmarch and the tourniquet was inflated to 280 mmHg.  A straight midline incision was made over the right KNEE and taken down to the extensor mechanism. A medial arthrotomy was performed. The patella was everted and the patellofemoral soft tissue was released, along with the patellar fat pad.  The anterior cruciate ligament and PCL were resected.  The anterior horns of the lateral and medial meniscus were resected. The medial soft tissue sleeve was elevated to the mid coronal plane.  A three-eighths inch drill bit was used to enter the femoral canal which was decompressed with suction and irrigation until clear.   The distal femoral cutting guide was set for 9 mm distal resection,  5valgus alignment, for a right knee. The distal femur was resected and checked for flatness.  The Depuy Sigma sizing femoral guide was placed and the femur was sized to a size 5.   The external alignment guide for the tibial resection was then applied to the distal and proximal tibia and set for anatomic slope along with 5 MM resection  from the lower medial side.   Rotational alignment was set using the malleolus, the tibial tubercle and the tibial spines.  The proximal tibia was resected along with  residual menisci. The tibia was sized using a base plate to a size 7.   The extension gap was checked.  5 and 6 mm blocks were placed in the 6 mm block gave full extension with good stability   A 4-in-1 cutting block was placed along with collateral ligament retractors and the  distal femoral cuts were completed.  Spacer blocks were used to confirm equal flexion extension gaps with releases done as needed. A size 6 MM spacer block gave equal stability and flexion extension.  The correct sized notch cutting guide for the femur was then applied and the notch cut was made.  Trial reduction was completed using size 5 femur 6 polyseven tibia trial implants. Patella  tracking was normal  The patella was reassessed and had normal cartilage and therefore was not resected  The proximal tibia was prepared using the size 7 base plate.  Thorough irrigation was performed and the bone was dried and prepared for cement. The cement was mixed on the back table using third generation preparation techniques   20 cc of dilute Exparel was injected into the soft tissues including the posterior capsule.    The implants were then cemented in place and excess cement was removed. The cement was allowed to cure. Irrigation was repeated and excess and residual bone fragments and cement were removed.  The extensor mechanism was closed with #1 Bralon suture in interrupted fashion followed by subcutaneous tissue closure using 0 Monocryl suture in 2 layers 1 interrupted 1 continuous   Skin approximation was performed using staples  A sterile dressing was applied, followed application of a TED hose The patient was taken recovery room in stable condition

## 2021-05-14 NOTE — Anesthesia Procedure Notes (Signed)
Spinal  Patient location during procedure: OR Start time: 05/14/2021 7:50 AM End time: 05/14/2021 7:57 AM Reason for block: surgical anesthesia Staffing Performed: anesthesiologist and resident/CRNA  Anesthesiologist: Louann Sjogren, MD Resident/CRNA: Lyda Jester, CRNA Preanesthetic Checklist Completed: patient identified, IV checked, site marked, risks and benefits discussed, surgical consent, monitors and equipment checked, pre-op evaluation and timeout performed Spinal Block Patient position: sitting Prep: Betadine Patient monitoring: heart rate, cardiac monitor, continuous pulse ox and blood pressure Approach: midline Location: L3-4 Injection technique: single-shot Needle Needle type: Pencan  Needle gauge: 22 G Needle length: 10 cm Assessment Sensory level: T4 Events: CSF return

## 2021-05-14 NOTE — Anesthesia Procedure Notes (Signed)
Procedure Name: LMA Insertion Date/Time: 05/14/2021 8:31 AM Performed by: Lyda Jester, CRNA Pre-anesthesia Checklist: Patient identified, Patient being monitored, Emergency Drugs available, Timeout performed and Suction available Patient Re-evaluated:Patient Re-evaluated prior to induction Oxygen Delivery Method: Circle System Utilized Preoxygenation: Pre-oxygenation with 100% oxygen Induction Type: IV induction Ventilation: Mask ventilation without difficulty LMA: LMA inserted LMA Size: 5.0 Number of attempts: 1 Placement Confirmation: positive ETCO2 and breath sounds checked- equal and bilateral

## 2021-05-14 NOTE — Telephone Encounter (Signed)
Done

## 2021-05-14 NOTE — Plan of Care (Signed)
  Problem: Acute Rehab PT Goals(only PT should resolve) Goal: Pt Will Go Supine/Side To Sit Outcome: Progressing Flowsheets (Taken 05/14/2021 1605) Pt will go Supine/Side to Sit: with modified independence Goal: Patient Will Transfer Sit To/From Stand Outcome: Progressing Flowsheets (Taken 05/14/2021 1605) Patient will transfer sit to/from stand: with modified independence Goal: Pt Will Transfer Bed To Chair/Chair To Bed Outcome: Progressing Flowsheets (Taken 05/14/2021 1605) Pt will Transfer Bed to Chair/Chair to Bed: with modified independence Goal: Pt Will Ambulate Outcome: Progressing Flowsheets (Taken 05/14/2021 1605) Pt will Ambulate:  > 125 feet  with modified independence  with rolling walker   4:05 PM, 05/14/21 Lonell Grandchild, MPT Physical Therapist with [facility] [phone] office [phone] mobile phone

## 2021-05-14 NOTE — Interval H&P Note (Signed)
History and Physical Interval Note:  05/14/2021 7:21 AM  Christopher [name]  has presented today for surgery, with the diagnosis of Osteoarthritis knee right.  The various methods of treatment have been discussed with the patient and family. After consideration of risks, benefits and other options for treatment, the patient has consented to  Procedure(s): TOTAL KNEE ARTHROPLASTY (Right) as a surgical intervention.  The patient's history has been reviewed, patient examined, no change in status, stable for surgery.  I have reviewed the patient's chart and labs.  Questions were answered to the patient's satisfaction.     [name]

## 2021-05-15 ENCOUNTER — Other Ambulatory Visit: Payer: Self-pay | Admitting: Orthopedic Surgery

## 2021-05-15 DIAGNOSIS — Z96651 Presence of right artificial knee joint: Secondary | ICD-10-CM | POA: Diagnosis not present

## 2021-05-15 DIAGNOSIS — M1710 Unilateral primary osteoarthritis, unspecified knee: Secondary | ICD-10-CM

## 2021-05-15 DIAGNOSIS — M1711 Unilateral primary osteoarthritis, right knee: Secondary | ICD-10-CM | POA: Diagnosis not present

## 2021-05-15 LAB — BASIC METABOLIC PANEL
Anion gap: 10 (ref 5–15)
BUN: 16 mg/dL (ref 8–23)
CO2: 29 mmol/L (ref 22–32)
Calcium: 8.3 mg/dL — ABNORMAL LOW (ref 8.9–10.3)
Chloride: 96 mmol/L — ABNORMAL LOW (ref 98–111)
Creatinine, Ser: 1.01 mg/dL (ref 0.61–1.24)
GFR, Estimated: >60 mL/min (ref >60)
Glucose, Bld: 153 mg/dL — ABNORMAL HIGH (ref 70–99)
Potassium: 4.9 mmol/L (ref 3.5–5.1)
Sodium: 135 mmol/L (ref 135–145)

## 2021-05-15 LAB — CBC
HCT: 39.1 % (ref 39.0–52.0)
Hemoglobin: 12.7 g/dL — ABNORMAL LOW (ref 13.0–17.0)
MCH: 28.8 pg (ref 26.0–34.0)
MCHC: 32.5 g/dL (ref 30.0–36.0)
MCV: 88.7 fL (ref 80.0–100.0)
Platelets: 270 10*3/uL (ref 150–400)
RBC: 4.41 MIL/uL (ref 4.22–5.81)
RDW: 14.9 % (ref 11.5–15.5)
WBC: 14.3 10*3/uL — ABNORMAL HIGH (ref 4.0–10.5)
nRBC: 0 % (ref 0.0–0.2)

## 2021-05-15 MED ORDER — PREGABALIN 50 MG PO CAPS: 50.0000 mg | ORAL_CAPSULE | Freq: Two times a day (BID) | ORAL | 1 refills | Status: DC

## 2021-05-15 MED ORDER — IBUPROFEN 800 MG PO TABS
800.0000 mg | ORAL_TABLET | Freq: Four times a day (QID) | ORAL | Status: DC
  Administered 2021-05-15 (×2): 800 mg via ORAL
  Filled 2021-05-15 (×2): qty 1

## 2021-05-15 MED ORDER — GABAPENTIN 100 MG PO CAPS
100.0000 mg | ORAL_CAPSULE | Freq: Three times a day (TID) | ORAL | Status: DC
  Administered 2021-05-15: 100 mg via ORAL
  Filled 2021-05-15: qty 1

## 2021-05-15 MED ORDER — METHOCARBAMOL 500 MG PO TABS: 500.0000 mg | ORAL_TABLET | Freq: Four times a day (QID) | ORAL | 5 refills | Status: DC | PRN

## 2021-05-15 MED ORDER — IBUPROFEN 800 MG PO TABS: 800.0000 mg | ORAL_TABLET | Freq: Four times a day (QID) | ORAL | 5 refills | Status: DC

## 2021-05-15 MED ORDER — ASPIRIN 325 MG PO TBEC: 325.0000 mg | DELAYED_RELEASE_TABLET | Freq: Every day | ORAL | 0 refills | Status: DC

## 2021-05-15 MED ORDER — OXYCODONE HCL ER 10 MG PO T12A: 10.0000 mg | EXTENDED_RELEASE_TABLET | Freq: Two times a day (BID) | ORAL | 0 refills | Status: DC

## 2021-05-15 MED ORDER — HYDROCODONE-ACETAMINOPHEN 10-325 MG PO TABS: 1.0000 | ORAL_TABLET | ORAL | 0 refills | Status: DC | PRN

## 2021-05-15 MED ORDER — HYDROCODONE-ACETAMINOPHEN 10-325 MG PO TABS
1.0000 | ORAL_TABLET | ORAL | Status: DC
  Administered 2021-05-15 (×2): 1 via ORAL
  Filled 2021-05-15 (×2): qty 1

## 2021-05-15 MED ORDER — DOCUSATE SODIUM 100 MG PO CAPS: 100.0000 mg | ORAL_CAPSULE | Freq: Two times a day (BID) | ORAL | 0 refills | Status: DC

## 2021-05-15 NOTE — TOC Initial Note (Signed)
Transition of Care [facility]) - Initial/Assessment Note    Patient Details  Name: Christopher [name] MRN: [id number] Date of Birth: 1956-01-13  [facility] CM/SW Contact:    Iona Beard, [place] Phone Number: 05/15/2021, 1:42 PM  Clinical Narrative:                 [facility] consulted for [facility] needs. CSW spoke to [place] with [facility] who states pt has been set up by Dr. Ruthe Mannan office for [facility] services. TOC signing off.   Expected Discharge Plan: [place] Barriers to Discharge: Barriers Resolved   Patient Goals and CMS Choice Patient states their goals for this hospitalization and ongoing recovery are:: Home with [facility] CMS Medicare.gov Compare Post Acute Care list provided to:: Patient Choice offered to / list presented to : Patient  Expected Discharge Plan and Services Expected Discharge Plan: [place] In-house Referral: Clinical Social Work Discharge Planning Services: CM Consult Post Acute Care Choice: [place] arrangements for the past 2 months: Single Family Home Expected Discharge Date: 05/15/21                                    Prior Living Arrangements/Services Living arrangements for the past 2 months: Single Family Home Lives with:: Spouse Patient language and need for interpreter reviewed:: Yes Do you feel safe going back to the place where you live?: Yes      Need for Family Participation in Patient Care: Yes (Comment) Care giver support system in place?: Yes (comment) Current home services: Home PT Criminal Activity/Legal Involvement Pertinent to Current Situation/Hospitalization: No - Comment as needed  Activities of Daily Living Home Assistive Devices/Equipment: None ADL Screening (condition at time of admission) Patient's cognitive ability adequate to safely complete daily activities?: Yes Is the patient deaf or have difficulty hearing?: No Does the patient have difficulty seeing, even when wearing  glasses/contacts?: No Does the patient have difficulty concentrating, remembering, or making decisions?: No Patient able to express need for assistance with ADLs?: Yes Does the patient have difficulty dressing or bathing?: No Independently performs ADLs?: Yes (appropriate for developmental age) Does the patient have difficulty walking or climbing stairs?: No Weakness of Legs: None Weakness of Arms/Hands: None  Permission Sought/Granted                  Emotional Assessment Appearance:: Appears stated age Attitude/Demeanor/Rapport: Engaged Affect (typically observed): Accepting Orientation: : Oriented to Self, Oriented to Place, Oriented to  Time, Oriented to Situation Alcohol / Substance Use: Not Applicable Psych Involvement: No (comment)  Admission diagnosis:  Status post total right knee replacement [Z96.651] Patient Active Problem List   Diagnosis Date Noted   Status post total right knee replacement 05/14/2021   Primary osteoarthritis of right knee    Renal cyst 10/03/2020   Frequency of micturition 10/03/2020   Benign prostatic hyperplasia with urinary obstruction 10/03/2020   IBS (irritable bowel syndrome) 07/05/2020   Lumbar disc herniation 06/29/2019   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HCC)    Primary osteoarthritis, left shoulder    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   History of colonic polyps 11/26/2017   S/P total knee replacement, left 06/16/17 06/16/2017   Primary osteoarthritis of left knee    Hyperlipidemia 09/04/2016   Angina pectoris ([place]) 09/04/2016   OSA (obstructive sleep apnea) 09/04/2016   COPD (chronic obstructive pulmonary disease) ([place]) 09/04/2016  Rotator cuff tear 05/05/2014   S/P shoulder surgery 09/26/2013   Arthritis, shoulder region 09/26/2013   Bursitis, shoulder 09/26/2013   Synovitis of shoulder 09/26/2013   Labral tear of shoulder, degenerative 09/26/2013   Biceps tendon tear 09/26/2013   Rotator cuff syndrome of left  shoulder 06/21/2013   Arthritis 06/21/2013   Patellar tendinitis 03/29/2013   Effusion of knee joint 03/29/2013   Bursitis/tendonitis, shoulder 03/10/2013   Effusion of knee joint, left 08/18/2011   Knee pain 08/18/2011   Acute torn meniscus 07/30/2011   Old torn meniscus of knee 07/30/2011   ARTHRITIS, LEFT KNEE 10/15/2010   MEDIAL MENISCUS TEAR, RIGHT 10/15/2010   HIP PAIN 01/15/2010   DEGENERATIVE DISC DISEASE, LUMBOSACRAL SPINE W/RADICULOPATHY 0000000   PLICA SYNDROME 0000000   DERANGEMENT MENISCUS 07/09/2009   JOINT EFFUSION, LEFT KNEE 07/09/2009   Unilateral primary osteoarthritis, left knee 01/30/2009   KNEE PAIN 01/30/2009   ANKLE SPRAIN, RIGHT 11/08/2007   PCP:  Leeanne Rio, MD Pharmacy:   [facility] [address] [place] [address] Phone: [phone] Fax: [phone]     Social Determinants of Health (SDOH) Interventions    Readmission Risk Interventions No flowsheet data found.

## 2021-05-15 NOTE — Progress Notes (Signed)
Subjective: 1 Day Post-Op Procedure(s) (LRB): TOTAL KNEE ARTHROPLASTY (Right) Patient reports pain as severe.    Objective: Vital signs in last 24 hours: Temp:  [97.5 F (36.4 C)-98.1 F (36.7 C)] 97.8 F (36.6 C) (09/14 0650) Pulse Rate:  [62-100] 100 (09/14 0650) Resp:  [10-20] 16 (09/14 0650) BP: (102-158)/(47-83) 107/47 (09/14 0650) SpO2:  [85 %-100 %] 85 % (09/14 0650)  Intake/Output from previous day: 09/13 0701 - 09/14 0700 In: 1020 [P.O.:720; I.V.:100; IV Piggyback:200] Out: 1350 [Urine:1325; Blood:25] Intake/Output this shift: No intake/output data recorded.  Recent Labs    05/15/21 0530  HGB 12.7*   Recent Labs    05/15/21 0530  WBC 14.3*  RBC 4.41  HCT 39.1  PLT 270   Recent Labs    05/15/21 0530  NA 135  K 4.9  CL 96*  CO2 29  BUN 16  CREATININE 1.01  GLUCOSE 153*  CALCIUM 8.3*   No results for input(s): LABPT, INR in the last 72 hours.  Neurologically intact Neurovascular intact Sensation intact distally Intact pulses distally Dorsiflexion/Plantar flexion intact No cellulitis present Compartment soft   Assessment/Plan: 1 Day Post-Op Procedure(s) (LRB): TOTAL KNEE ARTHROPLASTY (Right) Discharge home with home health   Meds will be changed   Oxycodone not helping at all   He has taken ibuprofen at home so will try here    Christopher [name] 05/15/2021, 8:02 AM

## 2021-05-15 NOTE — Progress Notes (Signed)
Physical Therapy Treatment Patient Details Name: Christopher [name] MRN: [id number] DOB: 06/11/1956 Today's Date: 05/15/2021  RIGHT KNEE ROM:  1 - 105 degrees AMBULATION DISTANCE:  150 feet using RW with Modified Independence   History of Present Illness Christopher [name] is a 65 y/o male, s/p Right TKA on 05/14/21, with the diagnosis of Osteoarthritis knee right.    PT Comments    Patient demonstrates increased endurance/distance for gait training with fair/good return for right heel to toe stepping without loss of balance, occasional verbal cues to take longer steps with good carryover, good return for going up/down steps in stair well using bilateral side rails, achieved up to 105 degrees right knee flexion while self stretching seated at bedside and good return for transferring to commode in bathroom using grab bar.  Patient tolerated sitting up in chair after therapy.  Patient will benefit from continued physical therapy in hospital and recommended venue below to increase strength, balance, endurance for safe ADLs and gait.    Recommendations for follow up therapy are one component of a multi-disciplinary discharge planning process, led by the attending physician.  Recommendations may be updated based on patient status, additional functional criteria and insurance authorization.  Follow Up Recommendations  Home health PT;Supervision for mobility/OOB;Supervision - Intermittent     Equipment Recommendations  None recommended by PT    Recommendations for Other Services       Precautions / Restrictions Precautions Precautions: Fall Restrictions Weight Bearing Restrictions: Yes RLE Weight Bearing: Weight bearing as tolerated     Mobility  Bed Mobility Overal bed mobility: Modified Independent             General bed mobility comments: had to use bed rail, demonstrates good return for moving RLE during supine to sitting    Transfers Overall transfer level: Modified  independent Equipment used: Rolling walker (2 wheeled) Transfers: Sit to/from [facility] Sit to Stand: Modified independent (Device/Increase time) Stand pivot transfers: Modified independent (Device/Increase time)       General transfer comment: good return for sit to stands from bedside and transferring to commode in bathroom and to chair  Ambulation/Gait Ambulation/Gait assistance: Modified independent (Device/Increase time) Gait Distance (Feet): 150 Feet Assistive device: Rolling walker (2 wheeled) Gait Pattern/deviations: Decreased step length - right;Decreased step length - left;Decreased stance time - left;Decreased stride length;Antalgic Gait velocity: decreased   General Gait Details: demonstrates increased endurance/distance for gait training with fair/good return for right heel to toe stepping without loss of balance, occasional verbal cues to take longer steps with good carryover   Stairs Stairs: Yes Stairs assistance: Supervision;Min guard Stair Management: Two rails;Step to pattern Number of Stairs: 3 General stair comments: demonstrates good return for going up/down steps in stair using bilateral siderails without loss of balance and understands to having spouse supervising at home   Wheelchair Mobility    Modified Rankin (Stroke Patients Only)       Balance Overall balance assessment: Needs assistance Sitting-balance support: Feet supported;No upper extremity supported Sitting balance-Leahy Scale: Good Sitting balance - Comments: seated at EOB   Standing balance support: During functional activity;Bilateral upper extremity supported Standing balance-Leahy Scale: Fair Standing balance comment: fair/good using RW                            Cognition Arousal/Alertness: Awake/alert Behavior During Therapy: WFL for tasks assessed/performed Overall Cognitive Status: Within Functional Limits for tasks assessed  Exercises Total Joint Exercises Ankle Circles/Pumps: Supine;10 reps;Right;Strengthening;AROM Quad Sets: Supine;Right;Strengthening;AROM;10 reps Short Arc Quad: Supine;Right;Strengthening;AROM;10 reps Heel Slides: Supine;10 reps;Right;Strengthening;AROM Goniometric ROM: right knee: 1 - 105 degrees    General Comments        Pertinent Vitals/Pain Pain Assessment: Faces Faces Pain Scale: Hurts even more Pain Location: right knee Pain Descriptors / Indicators: Sore;Grimacing Pain Intervention(s): Limited activity within patient's tolerance;Monitored during session;Premedicated before session;Repositioned    Home Living                      Prior Function            PT Goals (current goals can now be found in the care plan section) Acute Rehab PT Goals PT Goal Formulation: With patient Time For Goal Achievement: 05/17/21 Potential to Achieve Goals: Good Progress towards PT goals: Progressing toward goals    Frequency    BID      PT Plan Current plan remains appropriate    Co-evaluation              AM-PAC PT "6 Clicks" Mobility   Outcome Measure  Help needed turning from your back to your side while in a flat bed without using bedrails?: None Help needed moving from lying on your back to sitting on the side of a flat bed without using bedrails?: None Help needed moving to and from a bed to a chair (including a wheelchair)?: None Help needed standing up from a chair using your arms (e.g., wheelchair or bedside chair)?: A Little Help needed to walk in hospital room?: A Little Help needed climbing 3-5 steps with a railing? : A Little 6 Click Score: 21    End of Session   Activity Tolerance: Patient tolerated treatment well;Patient limited by fatigue;Patient limited by pain Patient left: in chair;with call bell/phone within reach Nurse Communication: Mobility status PT Visit Diagnosis: Unsteadiness on feet  (R26.81);Other abnormalities of gait and mobility (R26.89);Muscle weakness (generalized) (M62.81)     Time: [id number] PT Time Calculation (min) (ACUTE ONLY): 32 min  Charges:  $Gait Training: 8-22 mins $Therapeutic Exercise: 8-22 mins                     11:04 AM, 05/15/21 Lonell Grandchild, MPT Physical Therapist with [facility] [phone] office [phone] mobile phone

## 2021-05-15 NOTE — Discharge Summary (Signed)
Physician Discharge Summary  Patient ID: FINDLAY MUNSEY MRN: [id number] DOB/AGE: [id number] 63 y.o.  Admit date: 05/14/2021 Discharge date: 05/15/2021  Admission Diagnoses: Osteoarthritis right knee  Discharge Diagnoses: Same  Discharged Condition: Stable  Procedure: Right total knee OA medial compartment Lateral and patellofemoral compartments were relatively normal  Implant attune fixed-bearing posterior stabilized total knee 5 femur , 6 poly, seven tibia, no patella  Hospital Course: Mr. Nardiello had surgery on 13 September he had a right total knee standard approach attune fixed-bearing posterior stabilized total knee.  He had a spinal anesthetic which did not take immediately and he was converted to general anesthesia then he had a postop saphenous nerve block we had Intra-Op exparel as well  The patient did not get good pain control from Dilaudid or oral oxycodone and OxyContin and was switched back to hydrocodone.  This is thought to be secondary to long-term opioid use for various surgeries and pain control for other joint problems  He is not addicted  He has 100 degrees of flexion his extension is a little shy and I asked him to work on it he is neurovascular intact he has no compartment syndrome he is awake and alert he is oriented   Discharge Exam: BP (!) 107/47 (BP Location: Right Arm)   Pulse 100   Temp 97.8 F (36.6 C) (Oral)   Resp 16   Ht '5\' 6"'$  (1.676 m)   Wt 99.8 kg   SpO2 (!) 85%   BMI 35.51 kg/m  Physical Exam    Disposition: Discharge disposition: 01-Home or Self Care       Discharge Instructions     Call MD / Call 911   Complete by: As directed    If you experience chest pain or shortness of breath, CALL 911 and be transported to the hospital emergency room.  If you develope a fever above 101 F, pus (white drainage) or increased drainage or redness at the wound, or calf pain, call your surgeon's office.   Constipation Prevention   Complete  by: As directed    Drink plenty of fluids.  Prune juice may be helpful.  You may use a stool softener, such as Colace (over the counter) 100 mg twice a day.  Use MiraLax (over the counter) for constipation as needed.   Diet - low sodium heart healthy   Complete by: As directed    Discharge instructions   Complete by: As directed    Take Benadryl before taking your pain medicine (25 mg)  Ice the knee for 30 to 45 minutes 6 times a day  Use the blue foam for 30 minutes 4 times a day  Use the CPM machine from 0-90 for 2 hours 3 times a day  Wear the stockings for 2 weeks  Dressing does not have to be changed   Increase activity slowly as tolerated   Complete by: As directed    Post-operative opioid taper instructions:   Complete by: As directed    POST-OPERATIVE OPIOID TAPER INSTRUCTIONS: It is important to wean off of your opioid medication as soon as possible. If you do not need pain medication after your surgery it is ok to stop day one. Opioids include: Codeine, Hydrocodone(Norco, Vicodin), Oxycodone(Percocet, oxycontin) and hydromorphone amongst others.  Long term and even short term use of opiods can cause: Increased pain response Dependence Constipation Depression Respiratory depression And more.  Withdrawal symptoms can include Flu like symptoms Nausea, vomiting And more Techniques to manage these  symptoms Hydrate well Eat regular healthy meals Stay active Use relaxation techniques(deep breathing, meditating, yoga) Do Not substitute Alcohol to help with tapering If you have been on opioids for less than two weeks and do not have pain than it is ok to stop all together.  Plan to wean off of opioids This plan should start within one week post op of your joint replacement. Maintain the same interval or time between taking each dose and first decrease the dose.  Cut the total daily intake of opioids by one tablet each day Next start to increase the time between  doses. The last dose that should be eliminated is the evening dose.         Allergies as of 05/15/2021       Reactions   Celebrex [celecoxib] Itching, Swelling, Other (See Comments)   All over   Cortisone Swelling   SWELLING REACTION UNSPECIFIED    Doxycycline Swelling, Other (See Comments)   Made tongue turn black    Prednisone Swelling   SWELLING REACTION UNSPECIFIED    Relafen [nabumetone] Swelling   SWELLING REACTION UNSPECIFIED    Codeine Nausea And Vomiting, Other (See Comments)   Extreme stomach pain. This includes anything with the derivative of codeine in it.   Oxycodone-acetaminophen Itching, Other (See Comments)   Can not  tolerate with benadryl         Medication List     TAKE these medications    aspirin 325 MG EC tablet Take 1 tablet (325 mg total) by mouth daily with breakfast. Start taking on: May 16, 2021   benzonatate 100 MG capsule Commonly known as: TESSALON Take 100 mg by mouth 3 (three) times daily.   docusate sodium 100 MG capsule Commonly known as: COLACE Take 1 capsule (100 mg total) by mouth 2 (two) times daily.   EPINEPHrine 0.3 mg/0.3 mL Soaj injection Commonly known as: EPI-PEN Inject 0.3 mg into the muscle as needed for anaphylaxis.   HYDROcodone-acetaminophen 10-325 MG tablet Commonly known as: Norco Take 1 tablet by mouth every 4 (four) hours as needed. What changed: when to take this   ibuprofen 800 MG tablet Commonly known as: ADVIL Take 1 tablet (800 mg total) by mouth 4 (four) times daily.   ICY HOT EX Apply 1 application topically daily as needed (pain).   methocarbamol 500 MG tablet Commonly known as: ROBAXIN Take 1 tablet (500 mg total) by mouth every 6 (six) hours as needed for muscle spasms.   nitroGLYCERIN 0.4 MG SL tablet Commonly known as: NITROSTAT PLACE (1) TABLET UNDER TONGUE EVERY 5 MINUTES UP TO (3) DOSES. IF NO RELIEF CALL 911.   oxyCODONE 10 mg 12 hr tablet Commonly known as: OXYCONTIN Take  1 tablet (10 mg total) by mouth every 12 (twelve) hours.   pantoprazole 40 MG tablet Commonly known as: PROTONIX TAKE (1) TABLET TWICE A DAY BEFORE MEALS. What changed: See the new instructions.   pregabalin 50 MG capsule Commonly known as: LYRICA Take 1 capsule (50 mg total) by mouth 2 (two) times daily.   ProAir HFA 108 (90 Base) MCG/ACT inhaler Generic drug: albuterol Inhale 2 puffs into the lungs every 6 (six) hours as needed for wheezing or shortness of breath.   rosuvastatin 10 MG tablet Commonly known as: CRESTOR Take 10 mg by mouth daily.   sertraline 50 MG tablet Commonly known as: ZOLOFT Take 50 mg by mouth in the morning.   temazepam 7.5 MG capsule Commonly known as: RESTORIL Take  7.5 mg by mouth at bedtime.         Signed: [name] 05/15/2021, 12:26 PM

## 2021-05-16 ENCOUNTER — Telehealth: Payer: Self-pay | Admitting: Orthopedic Surgery

## 2021-05-16 ENCOUNTER — Encounter (HOSPITAL_COMMUNITY): Payer: Self-pay | Admitting: Orthopedic Surgery

## 2021-05-16 DIAGNOSIS — Z96651 Presence of right artificial knee joint: Secondary | ICD-10-CM | POA: Diagnosis not present

## 2021-05-16 DIAGNOSIS — G4733 Obstructive sleep apnea (adult) (pediatric): Secondary | ICD-10-CM | POA: Diagnosis not present

## 2021-05-16 DIAGNOSIS — I10 Essential (primary) hypertension: Secondary | ICD-10-CM | POA: Diagnosis not present

## 2021-05-16 DIAGNOSIS — Z7982 Long term (current) use of aspirin: Secondary | ICD-10-CM | POA: Diagnosis not present

## 2021-05-16 DIAGNOSIS — Z471 Aftercare following joint replacement surgery: Secondary | ICD-10-CM | POA: Diagnosis not present

## 2021-05-16 DIAGNOSIS — E785 Hyperlipidemia, unspecified: Secondary | ICD-10-CM | POA: Diagnosis not present

## 2021-05-16 NOTE — Telephone Encounter (Signed)
I called to give VO  Left voice mail

## 2021-05-16 NOTE — Telephone Encounter (Signed)
Christopher [name] from [facility] called and wants you to call him.  He needs verbal orders from our office.  Christopher [name]'s phone number is [phone]  Thanks

## 2021-05-18 DIAGNOSIS — Z96651 Presence of right artificial knee joint: Secondary | ICD-10-CM | POA: Diagnosis not present

## 2021-05-18 DIAGNOSIS — I10 Essential (primary) hypertension: Secondary | ICD-10-CM | POA: Diagnosis not present

## 2021-05-18 DIAGNOSIS — Z471 Aftercare following joint replacement surgery: Secondary | ICD-10-CM | POA: Diagnosis not present

## 2021-05-18 DIAGNOSIS — E785 Hyperlipidemia, unspecified: Secondary | ICD-10-CM | POA: Diagnosis not present

## 2021-05-18 DIAGNOSIS — G4733 Obstructive sleep apnea (adult) (pediatric): Secondary | ICD-10-CM | POA: Diagnosis not present

## 2021-05-18 DIAGNOSIS — Z7982 Long term (current) use of aspirin: Secondary | ICD-10-CM | POA: Diagnosis not present

## 2021-05-29 ENCOUNTER — Encounter: Payer: Self-pay | Admitting: Orthopedic Surgery

## 2021-05-29 ENCOUNTER — Other Ambulatory Visit: Payer: Self-pay

## 2021-05-29 ENCOUNTER — Ambulatory Visit (INDEPENDENT_AMBULATORY_CARE_PROVIDER_SITE_OTHER): Payer: PPO | Admitting: Orthopedic Surgery

## 2021-05-29 DIAGNOSIS — G8918 Other acute postprocedural pain: Secondary | ICD-10-CM

## 2021-05-29 MED ORDER — METHOCARBAMOL 500 MG PO TABS: 500.0000 mg | ORAL_TABLET | Freq: Four times a day (QID) | ORAL | 5 refills | Status: DC | PRN

## 2021-05-29 MED ORDER — HYDROCODONE-ACETAMINOPHEN 10-325 MG PO TABS: 1.0000 | ORAL_TABLET | ORAL | 0 refills | Status: DC | PRN

## 2021-05-29 NOTE — Progress Notes (Signed)
Chief Complaint  Patient presents with   Post-op Follow-up    Right TKA DOS 05/14/21   Meds ordered this encounter  Medications   HYDROcodone-acetaminophen (NORCO) 10-325 MG tablet    Sig: Take 1 tablet by mouth every 4 (four) hours as needed for up to 5 days.    Dispense:  30 tablet    Refill:  0   methocarbamol (ROBAXIN) 500 MG tablet    Sig: Take 1 tablet (500 mg total) by mouth every 6 (six) hours as needed for muscle spasms.    Dispense:  60 tablet    Refill:  5   Mr. Lyster is 2 weeks after his right total knee is postop day 15 his pain is much better controlled his wound looks good we took his staples out he has greater than 90 degrees of flexion full extension  He can start outpatient physical therapy I refilled his Robaxin and hydrocodone  Follow-up 3 weeks

## 2021-05-30 ENCOUNTER — Encounter: Payer: Self-pay | Admitting: Orthopedic Surgery

## 2021-06-04 ENCOUNTER — Ambulatory Visit (HOSPITAL_COMMUNITY): Payer: PPO | Attending: Orthopedic Surgery | Admitting: Physical Therapy

## 2021-06-04 ENCOUNTER — Other Ambulatory Visit: Payer: Self-pay

## 2021-06-04 ENCOUNTER — Encounter (HOSPITAL_COMMUNITY): Payer: Self-pay | Admitting: Physical Therapy

## 2021-06-04 DIAGNOSIS — M25561 Pain in right knee: Secondary | ICD-10-CM | POA: Insufficient documentation

## 2021-06-04 DIAGNOSIS — R2689 Other abnormalities of gait and mobility: Secondary | ICD-10-CM | POA: Diagnosis not present

## 2021-06-04 DIAGNOSIS — M25661 Stiffness of right knee, not elsewhere classified: Secondary | ICD-10-CM | POA: Diagnosis not present

## 2021-06-04 NOTE — Therapy (Signed)
[place], [place], [address] Phone: [phone]   Fax:  [phone]  Physical Therapy Evaluation  Patient Details  Name: Christopher [name] MRN: [id number] Date of Birth: 12/16/1955 Referring Provider (PT): [name] MD   Encounter Date: 06/04/2021   PT End of Session - 06/04/21 1219     Visit Number 1    Number of Visits 12    Date for PT Re-Evaluation 07/16/21    Authorization Type [employer]    Progress Note Due on Visit 10    PT Start Time 1120    PT Stop Time 1205    PT Time Calculation (min) 45 min    Activity Tolerance Patient tolerated treatment well    Behavior During Therapy [facility] for tasks assessed/performed             Past Medical History:  Diagnosis Date   Anxiety    Arthritis    Asthma    BPH (benign prostatic hyperplasia)    Complication of anesthesia    pt had a hard time being able to move after spinal anesthesia , 3-4 hours   Depression    GERD (gastroesophageal reflux disease)    Gout    no meds   Headache(784.0)    otc meds prn   Heart murmur    dx as a child, no problems as an adult   Hyperlipidemia    IBS (irritable bowel syndrome)    PONV (postoperative nausea and vomiting)    Pre-diabetes    Borderline, diet and exercise, no med   Sleep apnea    uses CIPAP machine at night   Wears partial dentures    bottom partial    Past Surgical History:  Procedure Laterality Date   APPENDECTOMY     BACK SURGERY  2002   neck and back fusion   BIOPSY  12/27/2015   Procedure: BIOPSY;  Surgeon: Rogene Houston, MD;  [facility];  Service: Endoscopy;;  Fundus biopsies and duodenal biopsies   BIOPSY  02/10/2020   Procedure: BIOPSY;  Surgeon: Rogene Houston, MD;  [facility];  Service: Endoscopy;;  antral   CARDIAC CATHETERIZATION     CARDIAC CATHETERIZATION N/A 09/04/2016   Procedure: Right/Left Heart Cath and Coronary Angiography;   Surgeon: Peter M Martinique, MD;  Location: [facility];  Service: Cardiovascular;  Laterality: N/A;   CHOLECYSTECTOMY     CHONDROPLASTY  08/15/2011   Procedure: CHONDROPLASTY;  Surgeon: [name], MD;  Location: [facility];  Service: Orthopedics;  Laterality: Left;   COLONOSCOPY  06/27/2011   Procedure: COLONOSCOPY;  Surgeon: Rogene Houston, MD;  [facility];  Service: Endoscopy;  Laterality: N/A;  9:00 / Pt to be here at 9am for 10:45 procedure, benign polyps removed   COLONOSCOPY N/A 10/05/2014   Procedure: COLONOSCOPY;  Surgeon: Rogene Houston, MD;  [facility];  Service: Endoscopy;  Laterality: N/A;  930   COLONOSCOPY N/A 02/11/2018   Procedure: COLONOSCOPY;  Surgeon: Rogene Houston, MD;  [facility];  Service: Endoscopy;  Laterality: N/A;  830   ESOPHAGOGASTRODUODENOSCOPY N/A 12/27/2015   Procedure: ESOPHAGOGASTRODUODENOSCOPY (EGD);  Surgeon: Rogene Houston, MD;  [facility];  Service: Endoscopy;  Laterality: N/A;  3:00   ESOPHAGOGASTRODUODENOSCOPY (EGD) WITH PROPOFOL N/A 02/10/2020   Procedure: ESOPHAGOGASTRODUODENOSCOPY (EGD) WITH PROPOFOL;  Surgeon: Rogene Houston, MD;  [facility];  Service:  Endoscopy;  Laterality: N/A;  155   HERNIA REPAIR     umbilical hernia   JOINT REPLACEMENT     knee and shoulder   KNEE ARTHROSCOPY     left knee   KNEE ARTHROSCOPY     right knee    LUMBAR LAMINECTOMY/DECOMPRESSION MICRODISCECTOMY  09/14/2012   Procedure: LUMBAR LAMINECTOMY/DECOMPRESSION MICRODISCECTOMY 1 LEVEL;  Surgeon: Floyce Stakes, MD;  Location: [facility];  Service: Neurosurgery;  Laterality: Right;  Right Lumbar three-four Diskectomy   neck fusion  2005   POLYPECTOMY  02/11/2018   Procedure: POLYPECTOMY;  Surgeon: Rogene Houston, MD;  [facility];  Service: Endoscopy;;  colon   REVISION TOTAL SHOULDER TO REVERSE TOTAL SHOULDER Left 12/16/2018   Procedure: REVISION TOTAL SHOULDER TO REVERSE  TOTAL SHOULDER;  Surgeon: Meredith Pel, MD;  Location: [facility];  Service: Orthopedics;  Laterality: Left;   SHOULDER ARTHROSCOPY WITH BICEPSTENOTOMY Left 09/23/2013   Procedure: SHOULDER ARTHROSCOPY WITH BICEPSTENOTOMY AND EXTENSIVE DEBRIDEMENT;  Surgeon: Carole Civil, MD;  [facility];  Service: Orthopedics;  Laterality: Left;   SHOULDER ARTHROSCOPY WITH ROTATOR CUFF REPAIR Left 05/05/2014   Procedure: SHOULDER ARTHROSCOPY LIMITED DEBRIDEMENT;  Surgeon: Carole Civil, MD;  [facility];  Service: Orthopedics;  Laterality: Left;   SHOULDER OPEN ROTATOR CUFF REPAIR Left 05/05/2014   Procedure: ROTATOR CUFF REPAIR SHOULDER OPEN;  Surgeon: Carole Civil, MD;  [facility];  Service: Orthopedics;  Laterality: Left;   SHOULDER OPEN ROTATOR CUFF REPAIR Left 12/16/2018   Procedure: LEFT SHOULDER POSSIBLE SUBSCAPULARIS REPAIR VS. REVISION TO REVERSE TOTAL SHOULDER REPLACEMENT;  Surgeon: Meredith Pel, MD;  Location: [facility];  Service: Orthopedics;  Laterality: Left;   SHOULDER SURGERY Right    Open Mumford procedure   SPINAL FUSION     x2, 2003 and 2007   TOTAL KNEE ARTHROPLASTY Left 06/16/2017   Procedure: LEFT TOTAL KNEE ARTHROPLASTY;  Surgeon: Carole Civil, MD;  [facility];  Service: Orthopedics;  Laterality: Left;   TOTAL KNEE ARTHROPLASTY Right 05/14/2021   Procedure: TOTAL KNEE ARTHROPLASTY;  Surgeon: Carole Civil, MD;  [facility];  Service: Orthopedics;  Laterality: Right;   TOTAL SHOULDER ARTHROPLASTY Left 08/19/2018   Procedure: left shoulder replacement;  Surgeon: Meredith Pel, MD;  Location: [facility];  Service: Orthopedics;  Laterality: Left;    There were no vitals filed for this visit.    Subjective Assessment - 06/04/21 1128     Subjective Patient presents to therapy with complaint of RT knee pain s/p RT TKA on 05/14/21. He is having ongoing pain and soreness. He is currently managing sx with prescribed mediation and  ice. He had 2 weeks of home health physical therapy. He has used CPM which he feels helped a lot. He has since progressed walking from walker to cane, and now is walking with no AD.    Pertinent History RT TKA 05/14/21    Limitations Lifting;Standing;Walking;House hold activities    How long can you stand comfortably? 30 minutes    Patient Stated Goals get rid of stiffness/ soreness    Currently in Pain? Yes    Pain Score 7     Pain Location Knee    Pain Orientation Right;Anterior    Pain Descriptors / Indicators Aching    Pain Type Surgical pain    Pain Onset More than a month ago    Pain Frequency Intermittent    Aggravating Factors  bending, walking, standing  Pain Relieving Factors rest, ice, meds    Effect of Pain on Daily Activities Limits                [facility] PT Assessment - 06/04/21 0001       Assessment   Medical Diagnosis RT TKA    Referring Provider (PT) [name] MD    Onset Date/Surgical Date 05/14/21    Prior Therapy Yes      Precautions   Precautions None      Restrictions   Weight Bearing Restrictions No      Balance Screen   Has the patient fallen in the past 6 months No      [place] residence      Prior Function   Level of Independence Independent      Cognition   Overall Cognitive Status Within Functional Limits for tasks assessed      Observation/Other Assessments   Observations Incision is intact, appears to be well healing    Focus on Therapeutic Outcomes (FOTO)  58% function      Observation/Other Assessments-Edema    Edema --   Min edema noted diffuse about Rt knee and ankle     Sensation   Light Touch Appears Intact      ROM / Strength   AROM / PROM / Strength AROM;Strength      AROM   AROM Assessment Site Knee    Right/Left Knee Right;Left    Right Knee Extension 5    Right Knee Flexion 105      Strength   Strength Assessment Site Hip;Knee;Ankle    Right/Left Hip Right;Left     Right Hip Flexion 5/5    Left Hip Flexion 5/5    Right/Left Knee Right;Left    Right Knee Flexion 4-/5    Right Knee Extension 4-/5    Left Knee Flexion 5/5    Left Knee Extension 5/5    Right/Left Ankle Right;Left    Right Ankle Dorsiflexion 5/5    Left Ankle Dorsiflexion 4+/5      Palpation   Palpation comment No notable TTP      Ambulation/Gait   Ambulation/Gait Yes    Ambulation/Gait Assistance 6: Modified independent (Device/Increase time)    Ambulation Distance (Feet) 395 Feet    Assistive device None    Gait Pattern Decreased step length - right;Decreased stride length;Decreased hip/knee flexion - right    Ambulation Surface Level;Indoor    Gait Comments 2MWT      Balance   Balance Assessed Yes      Static Standing Balance   Static Standing Balance -  Activities  Tandam Stance - Right Leg;Tandam Stance - Left Leg    Static Standing - Comment/# of Minutes >30 sec bilateral on solid floor                        Objective measurements completed on examination: See above findings.       [facility] Adult PT Treatment/Exercise - 06/04/21 0001       Exercises   Exercises Knee/Hip      Knee/Hip Exercises: Stretches   Passive Hamstring Stretch Right;2 reps;30 seconds      Knee/Hip Exercises: Seated   Sit to Sand 1 set;10 reps;without UE support      Knee/Hip Exercises: Supine   Heel Slides Right;10 reps    Straight Leg Raises Right;10 reps;1 set  PT Education - 06/04/21 1129     Education Details on evaluation findings, POC and HEP    Person(s) Educated Patient    Methods Explanation;Handout    Comprehension Verbalized understanding              PT Short Term Goals - 06/04/21 1313       PT SHORT TERM GOAL #1   Title Patient will be independent with initial HEP and self-management strategies to improve functional outcomes    Time 3    Period Weeks    Status New    Target Date 06/25/21      PT SHORT  TERM GOAL #2   Title Patient will be able to maintain single leg stance >20 seconds on BLEs to improve stability and functional ADLs i.e. stepping into truck/ lawn mower    Time 3    Period Weeks    Status New    Target Date 06/25/21               PT Long Term Goals - 06/04/21 1316       PT LONG TERM GOAL #1   Title Patient will improve FOTO score to predicted value to indicate improvement in functional outcomes    Time 6    Period Weeks    Status New    Target Date 07/16/21      PT LONG TERM GOAL #2   Title Patient will have RT knee AROM 0-120 degrees to improve functional mobility and facilitate squatting to pick up items from floor.    Time 6    Period Weeks    Status New    Target Date 07/16/21      PT LONG TERM GOAL #3   Title Patient will have equal to 5/5 MMT throughout RLE to improve ability to perform functional mobility, stair ambulation and ADLs.    Time 6    Period Weeks    Status New    Target Date 07/16/21      PT LONG TERM GOAL #4   Title Patient will be able to maintain RT single leg stance on compliant surface >20 seconds to demo improved stability and ability to perform functional ADLs i.e.  stepping onto lawn mower/ ambulating in grass    Time 6    Period Weeks    Status New    Target Date 07/16/21                    Plan - 06/04/21 1220     Clinical Impression Statement Patient is a 65 y.o. male who presents to physical therapy with complaint of RT knee pain s/p RT TKA on 05/14/21. Patient demonstrates decreased strength, ROM restriction, balance deficits and gait abnormalities which are likely contributing to symptoms of pain and are negatively impacting patient ability to perform ADLs and functional mobility tasks. Patient will benefit from skilled physical therapy services to address these deficits to reduce pain and improve level of function with ADLs and functional mobility tasks.    Examination-Activity Limitations  Bend;Stand;Lift;Stairs;Squat;Sleep;Transfers;Locomotion Level    Examination-Participation Restrictions Cleaning;Yard Work;Community Activity;Shop    Stability/Clinical Decision Making Stable/Uncomplicated    Clinical Decision Making Low    Rehab Potential Good    PT Frequency 2x / week    PT Duration 6 weeks    PT Treatment/Interventions ADLs/Self Care Home Management;Biofeedback;Cryotherapy;Fluidtherapy;Parrafin;Therapeutic activities;Functional mobility training;Patient/family education;Manual techniques;Manual lymph drainage;Energy conservation;Dry needling;Splinting;Spinal Manipulations;Visual/perceptual remediation/compensation;Passive range of motion;Scar mobilization;Orthotic Fit/Training;Compression bandaging;DME Instruction;Therapeutic exercise;Balance training;Neuromuscular  re-education;Gait training;Stair training;Iontophoresis 4mg /ml Dexamethasone;Contrast Bath;Electrical Stimulation;Moist Heat;Traction;Ultrasound;Vasopneumatic Device;Taping;Joint Manipulations;Vestibular    PT Next Visit Plan Review goals and HEP. Progress knee ROM and strengthening as tolerated. Step ups/ downs, static balance, heel raise, hip abduction, bike    PT Home Exercise Plan Eval: quad set, SLR, sit to stand, heel slide, HS stretch and HH HEP review    Consulted and Agree with Plan of Care Patient             Patient will benefit from skilled therapeutic intervention in order to improve the following deficits and impairments:  Abnormal gait, Pain, Improper body mechanics, Increased fascial restricitons, Decreased mobility, Decreased scar mobility, Decreased activity tolerance, Decreased balance, Difficulty walking, Increased edema, Impaired flexibility, Decreased strength, Hypomobility, Decreased range of motion  Visit Diagnosis: Right knee pain, unspecified chronicity  Stiffness of right knee, not elsewhere classified  Other abnormalities of gait and mobility     Problem List Patient Active  Problem List   Diagnosis Date Noted   Status post total right knee replacement 05/14/2021   Primary osteoarthritis of right knee    Renal cyst 10/03/2020   Frequency of micturition 10/03/2020   Benign prostatic hyperplasia with urinary obstruction 10/03/2020   IBS (irritable bowel syndrome) 07/05/2020   Lumbar disc herniation 06/29/2019   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HCC)    Primary osteoarthritis, left shoulder    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   History of colonic polyps 11/26/2017   S/P total knee replacement, left 06/16/17 06/16/2017   Primary osteoarthritis of left knee    Hyperlipidemia 09/04/2016   Angina pectoris ([place]) 09/04/2016   OSA (obstructive sleep apnea) 09/04/2016   COPD (chronic obstructive pulmonary disease) ([place]) 09/04/2016   Rotator cuff tear 05/05/2014   S/P shoulder surgery 09/26/2013   Arthritis, shoulder region 09/26/2013   Bursitis, shoulder 09/26/2013   Synovitis of shoulder 09/26/2013   Labral tear of shoulder, degenerative 09/26/2013   Biceps tendon tear 09/26/2013   Rotator cuff syndrome of left shoulder 06/21/2013   Arthritis 06/21/2013   Patellar tendinitis 03/29/2013   Effusion of knee joint 03/29/2013   Bursitis/tendonitis, shoulder 03/10/2013   Effusion of knee joint, left 08/18/2011   Knee pain 08/18/2011   Acute torn meniscus 07/30/2011   Old torn meniscus of knee 07/30/2011   ARTHRITIS, LEFT KNEE 10/15/2010   MEDIAL MENISCUS TEAR, RIGHT 10/15/2010   HIP PAIN 01/15/2010   DEGENERATIVE DISC DISEASE, LUMBOSACRAL SPINE W/RADICULOPATHY 50/27/7412   PLICA SYNDROME 87/86/7672   DERANGEMENT MENISCUS 07/09/2009   JOINT EFFUSION, LEFT KNEE 07/09/2009   Unilateral primary osteoarthritis, left knee 01/30/2009   KNEE PAIN 01/30/2009   ANKLE SPRAIN, RIGHT 11/08/2007   1:23 PM, 06/04/21 Josue Hector PT DPT  Physical Therapist with [facility]  [phone] [place], [place], [address] Phone: [phone]   Fax:  [phone]  Name: Christopher [name] MRN: [id number] Date of Birth: 04/06/1956

## 2021-06-04 NOTE — Patient Instructions (Signed)
Access Code: [email or web address] URL: [email or web address] Date: 06/04/2021 Prepared by: Josue Hector  Exercises Supine Quad Set - 2-3 x daily - 7 x weekly - 2 sets - 10 reps - 5 seconds hold Active Straight Leg Raise with Quad Set - 2-3 x daily - 7 x weekly - 2 sets - 10 reps Supine Heel Slide - 2-3 x daily - 7 x weekly - 2 sets - 10 reps Sit to Stand Without Arm Support - 2-3 x daily - 7 x weekly - 1-2 sets - 10 reps Seated Hamstring Stretch - 2-3 x daily - 7 x weekly - 1 sets - 3 reps - 30 seconds hold

## 2021-06-06 ENCOUNTER — Encounter (HOSPITAL_COMMUNITY): Payer: PPO

## 2021-06-11 ENCOUNTER — Other Ambulatory Visit: Payer: Self-pay

## 2021-06-11 ENCOUNTER — Ambulatory Visit (HOSPITAL_COMMUNITY): Payer: PPO | Admitting: Physical Therapy

## 2021-06-11 ENCOUNTER — Encounter (HOSPITAL_COMMUNITY): Payer: Self-pay | Admitting: Physical Therapy

## 2021-06-11 DIAGNOSIS — M25561 Pain in right knee: Secondary | ICD-10-CM

## 2021-06-11 DIAGNOSIS — M25661 Stiffness of right knee, not elsewhere classified: Secondary | ICD-10-CM

## 2021-06-11 DIAGNOSIS — R2689 Other abnormalities of gait and mobility: Secondary | ICD-10-CM

## 2021-06-11 NOTE — Therapy (Signed)
[place], [place], [address] Phone: [phone]   Fax:  [phone]  Physical Therapy Treatment  Patient Details  Name: Christopher [name] MRN: [id number] Date of Birth: 12-Oct-1955 Referring Provider (PT): [name] MD   Encounter Date: 06/11/2021   PT End of Session - 06/11/21 1310     Visit Number 2    Number of Visits 12    Date for PT Re-Evaluation 07/16/21    Authorization Type [employer]    Progress Note Due on Visit 10    PT Start Time 1305    PT Stop Time 1343    PT Time Calculation (min) 38 min    Activity Tolerance Patient tolerated treatment well    Behavior During Therapy [facility] for tasks assessed/performed             Past Medical History:  Diagnosis Date   Anxiety    Arthritis    Asthma    BPH (benign prostatic hyperplasia)    Complication of anesthesia    pt had a hard time being able to move after spinal anesthesia , 3-4 hours   Depression    GERD (gastroesophageal reflux disease)    Gout    no meds   Headache(784.0)    otc meds prn   Heart murmur    dx as a child, no problems as an adult   Hyperlipidemia    IBS (irritable bowel syndrome)    PONV (postoperative nausea and vomiting)    Pre-diabetes    Borderline, diet and exercise, no med   Sleep apnea    uses CIPAP machine at night   Wears partial dentures    bottom partial    Past Surgical History:  Procedure Laterality Date   APPENDECTOMY     BACK SURGERY  2002   neck and back fusion   BIOPSY  12/27/2015   Procedure: BIOPSY;  Surgeon: Rogene Houston, MD;  [facility];  Service: Endoscopy;;  Fundus biopsies and duodenal biopsies   BIOPSY  02/10/2020   Procedure: BIOPSY;  Surgeon: Rogene Houston, MD;  [facility];  Service: Endoscopy;;  antral   CARDIAC CATHETERIZATION     CARDIAC CATHETERIZATION N/A 09/04/2016   Procedure: Right/Left Heart Cath and Coronary Angiography;   Surgeon: Peter M Martinique, MD;  Location: [facility];  Service: Cardiovascular;  Laterality: N/A;   CHOLECYSTECTOMY     CHONDROPLASTY  08/15/2011   Procedure: CHONDROPLASTY;  Surgeon: [name], MD;  Location: [facility];  Service: Orthopedics;  Laterality: Left;   COLONOSCOPY  06/27/2011   Procedure: COLONOSCOPY;  Surgeon: Rogene Houston, MD;  [facility];  Service: Endoscopy;  Laterality: N/A;  9:00 / Pt to be here at 9am for 10:45 procedure, benign polyps removed   COLONOSCOPY N/A 10/05/2014   Procedure: COLONOSCOPY;  Surgeon: Rogene Houston, MD;  [facility];  Service: Endoscopy;  Laterality: N/A;  930   COLONOSCOPY N/A 02/11/2018   Procedure: COLONOSCOPY;  Surgeon: Rogene Houston, MD;  [facility];  Service: Endoscopy;  Laterality: N/A;  830   ESOPHAGOGASTRODUODENOSCOPY N/A 12/27/2015   Procedure: ESOPHAGOGASTRODUODENOSCOPY (EGD);  Surgeon: Rogene Houston, MD;  [facility];  Service: Endoscopy;  Laterality: N/A;  3:00   ESOPHAGOGASTRODUODENOSCOPY (EGD) WITH PROPOFOL N/A 02/10/2020   Procedure: ESOPHAGOGASTRODUODENOSCOPY (EGD) WITH PROPOFOL;  Surgeon: Rogene Houston, MD;  [facility];  Service:  Endoscopy;  Laterality: N/A;  155   HERNIA REPAIR     umbilical hernia   JOINT REPLACEMENT     knee and shoulder   KNEE ARTHROSCOPY     left knee   KNEE ARTHROSCOPY     right knee    LUMBAR LAMINECTOMY/DECOMPRESSION MICRODISCECTOMY  09/14/2012   Procedure: LUMBAR LAMINECTOMY/DECOMPRESSION MICRODISCECTOMY 1 LEVEL;  Surgeon: Floyce Stakes, MD;  Location: [facility];  Service: Neurosurgery;  Laterality: Right;  Right Lumbar three-four Diskectomy   neck fusion  2005   POLYPECTOMY  02/11/2018   Procedure: POLYPECTOMY;  Surgeon: Rogene Houston, MD;  [facility];  Service: Endoscopy;;  colon   REVISION TOTAL SHOULDER TO REVERSE TOTAL SHOULDER Left 12/16/2018   Procedure: REVISION TOTAL SHOULDER TO REVERSE  TOTAL SHOULDER;  Surgeon: Meredith Pel, MD;  Location: [facility];  Service: Orthopedics;  Laterality: Left;   SHOULDER ARTHROSCOPY WITH BICEPSTENOTOMY Left 09/23/2013   Procedure: SHOULDER ARTHROSCOPY WITH BICEPSTENOTOMY AND EXTENSIVE DEBRIDEMENT;  Surgeon: Carole Civil, MD;  [facility];  Service: Orthopedics;  Laterality: Left;   SHOULDER ARTHROSCOPY WITH ROTATOR CUFF REPAIR Left 05/05/2014   Procedure: SHOULDER ARTHROSCOPY LIMITED DEBRIDEMENT;  Surgeon: Carole Civil, MD;  [facility];  Service: Orthopedics;  Laterality: Left;   SHOULDER OPEN ROTATOR CUFF REPAIR Left 05/05/2014   Procedure: ROTATOR CUFF REPAIR SHOULDER OPEN;  Surgeon: Carole Civil, MD;  [facility];  Service: Orthopedics;  Laterality: Left;   SHOULDER OPEN ROTATOR CUFF REPAIR Left 12/16/2018   Procedure: LEFT SHOULDER POSSIBLE SUBSCAPULARIS REPAIR VS. REVISION TO REVERSE TOTAL SHOULDER REPLACEMENT;  Surgeon: Meredith Pel, MD;  Location: [facility];  Service: Orthopedics;  Laterality: Left;   SHOULDER SURGERY Right    Open Mumford procedure   SPINAL FUSION     x2, 2003 and 2007   TOTAL KNEE ARTHROPLASTY Left 06/16/2017   Procedure: LEFT TOTAL KNEE ARTHROPLASTY;  Surgeon: Carole Civil, MD;  [facility];  Service: Orthopedics;  Laterality: Left;   TOTAL KNEE ARTHROPLASTY Right 05/14/2021   Procedure: TOTAL KNEE ARTHROPLASTY;  Surgeon: Carole Civil, MD;  [facility];  Service: Orthopedics;  Laterality: Right;   TOTAL SHOULDER ARTHROPLASTY Left 08/19/2018   Procedure: left shoulder replacement;  Surgeon: Meredith Pel, MD;  Location: [facility];  Service: Orthopedics;  Laterality: Left;    There were no vitals filed for this visit.   Subjective Assessment - 06/11/21 1310     Subjective Patient says he is doing well today. He did some mowing over the weekend with little issues. He has minimal soreness today. Compliant with HEP.    Pertinent History RT TKA 05/14/21     Limitations Lifting;Standing;Walking;House hold activities    How long can you stand comfortably? 30 minutes    Patient Stated Goals get rid of stiffness/ soreness    Currently in Pain? No/denies    Pain Onset More than a month ago                               [facility] Adult PT Treatment/Exercise - 06/11/21 0001       Knee/Hip Exercises: Stretches   Passive Hamstring Stretch Right;3 reps;30 seconds    Other Knee/Hip Stretches standing calf stretch at incline slope 2 x 30"      Knee/Hip Exercises: Aerobic   Recumbent Bike 5 min dynamic warmup LV 1  Knee/Hip Exercises: Standing   Heel Raises Both;15 reps;2 sets   eccentric lowering   Forward Step Up Right;1 set;10 reps;Hand Hold: 0;Step Height: 4";2 sets;Step Height: 6"   2nd set 6 inch   Step Down Right;2 sets;15 reps;Hand Hold: 1;Step Height: 6"    Other Standing Knee Exercises tandem stance 3 x 30" (head turns with 2nd and 3rd set)    Other Standing Knee Exercises band sidestepping GTB 2 x 15 (steps)                     PT Education - 06/11/21 1341     Education Details on updated HEP and self scar tissue message    Person(s) Educated Patient    Methods Handout;Explanation    Comprehension Verbalized understanding              PT Short Term Goals - 06/04/21 1313       PT SHORT TERM GOAL #1   Title Patient will be independent with initial HEP and self-management strategies to improve functional outcomes    Time 3    Period Weeks    Status New    Target Date 06/25/21      PT SHORT TERM GOAL #2   Title Patient will be able to maintain single leg stance >20 seconds on BLEs to improve stability and functional ADLs i.e. stepping into truck/ lawn mower    Time 3    Period Weeks    Status New    Target Date 06/25/21               PT Long Term Goals - 06/04/21 1316       PT LONG TERM GOAL #1   Title Patient will improve FOTO score to predicted value to indicate improvement  in functional outcomes    Time 6    Period Weeks    Status New    Target Date 07/16/21      PT LONG TERM GOAL #2   Title Patient will have RT knee AROM 0-120 degrees to improve functional mobility and facilitate squatting to pick up items from floor.    Time 6    Period Weeks    Status New    Target Date 07/16/21      PT LONG TERM GOAL #3   Title Patient will have equal to 5/5 MMT throughout RLE to improve ability to perform functional mobility, stair ambulation and ADLs.    Time 6    Period Weeks    Status New    Target Date 07/16/21      PT LONG TERM GOAL #4   Title Patient will be able to maintain RT single leg stance on compliant surface >20 seconds to demo improved stability and ability to perform functional ADLs i.e.  stepping onto lawn mower/ ambulating in grass    Time 6    Period Weeks    Status New    Target Date 07/16/21                   Plan - 06/11/21 1340     Clinical Impression Statement Patient tolerated session well. Introduced patient to LE strengthening exercises in standing with patient stating minimal stiffness in anterior knee. Patient challenged by tandem stance with head turns to address patient reported balance troubles. Patient continues to benefit from skilled therapy to for LE strengthening and balance to return to PLOF.    Examination-Activity Limitations Bend;Stand;Lift;Stairs;Squat;Sleep;Transfers;Locomotion Level  Examination-Participation Restrictions Cleaning;Yard Work;Community Activity;Shop    Stability/Clinical Decision Making Stable/Uncomplicated    Rehab Potential Good    PT Frequency 2x / week    PT Duration 6 weeks    PT Treatment/Interventions ADLs/Self Care Home Management;Biofeedback;Cryotherapy;Fluidtherapy;Parrafin;Therapeutic activities;Functional mobility training;Patient/family education;Manual techniques;Manual lymph drainage;Energy conservation;Dry needling;Splinting;Spinal Manipulations;Visual/perceptual  remediation/compensation;Passive range of motion;Scar mobilization;Orthotic Fit/Training;Compression bandaging;DME Instruction;Therapeutic exercise;Balance training;Neuromuscular re-education;Gait training;Stair training;Iontophoresis 4mg /ml Dexamethasone;Contrast Bath;Electrical Stimulation;Moist Heat;Traction;Ultrasound;Vasopneumatic Device;Taping;Joint Manipulations;Vestibular    PT Next Visit Plan Progress knee ROM and strengthening as tolerated. Higher level functional strength and balance    PT Home Exercise Plan Eval: quad set, SLR, sit to stand, heel slide, HS stretch and HH HEP review 10/11: tandem stance, sidestepping at counter, FWD step up    Consulted and Agree with Plan of Care Patient             Patient will benefit from skilled therapeutic intervention in order to improve the following deficits and impairments:  Abnormal gait, Pain, Improper body mechanics, Increased fascial restricitons, Decreased mobility, Decreased scar mobility, Decreased activity tolerance, Decreased balance, Difficulty walking, Increased edema, Impaired flexibility, Decreased strength, Hypomobility, Decreased range of motion  Visit Diagnosis: Right knee pain, unspecified chronicity  Stiffness of right knee, not elsewhere classified  Other abnormalities of gait and mobility     Problem List Patient Active Problem List   Diagnosis Date Noted   Status post total right knee replacement 05/14/2021   Primary osteoarthritis of right knee    Renal cyst 10/03/2020   Frequency of micturition 10/03/2020   Benign prostatic hyperplasia with urinary obstruction 10/03/2020   IBS (irritable bowel syndrome) 07/05/2020   Lumbar disc herniation 06/29/2019   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HCC)    Primary osteoarthritis, left shoulder    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   History of colonic polyps 11/26/2017   S/P total knee replacement, left 06/16/17 06/16/2017   Primary osteoarthritis of left knee     Hyperlipidemia 09/04/2016   Angina pectoris ([place]) 09/04/2016   OSA (obstructive sleep apnea) 09/04/2016   COPD (chronic obstructive pulmonary disease) ([place]) 09/04/2016   Rotator cuff tear 05/05/2014   S/P shoulder surgery 09/26/2013   Arthritis, shoulder region 09/26/2013   Bursitis, shoulder 09/26/2013   Synovitis of shoulder 09/26/2013   Labral tear of shoulder, degenerative 09/26/2013   Biceps tendon tear 09/26/2013   Rotator cuff syndrome of left shoulder 06/21/2013   Arthritis 06/21/2013   Patellar tendinitis 03/29/2013   Effusion of knee joint 03/29/2013   Bursitis/tendonitis, shoulder 03/10/2013   Effusion of knee joint, left 08/18/2011   Knee pain 08/18/2011   Acute torn meniscus 07/30/2011   Old torn meniscus of knee 07/30/2011   ARTHRITIS, LEFT KNEE 10/15/2010   MEDIAL MENISCUS TEAR, RIGHT 10/15/2010   HIP PAIN 01/15/2010   DEGENERATIVE DISC DISEASE, LUMBOSACRAL SPINE W/RADICULOPATHY 86/76/1950   PLICA SYNDROME 93/26/7124   DERANGEMENT MENISCUS 07/09/2009   JOINT EFFUSION, LEFT KNEE 07/09/2009   Unilateral primary osteoarthritis, left knee 01/30/2009   KNEE PAIN 01/30/2009   ANKLE SPRAIN, RIGHT 11/08/2007   1:53 PM, 06/11/21 Josue Hector PT DPT  Physical Therapist with [facility]  [phone] [place], [place], [address] Phone: [phone]   Fax:  [phone]  Name: REGNALD BOWENS MRN: [id number] Date of Birth: [birth year]

## 2021-06-11 NOTE — Patient Instructions (Signed)
Access Code: [email or web address] URL: [email or web address] Date: 06/11/2021 Prepared by: Josue Hector  Exercises Standing Tandem Balance with Counter Support - 2-3 x daily - 7 x weekly - 1 sets - 4 reps - 30 second hold Side Stepping with Resistance at Ankles and Counter Support - 2-3 x daily - 7 x weekly - 2 sets - 10 reps Forward Step Up - 2-3 x daily - 7 x weekly - 2 sets - 10 reps

## 2021-06-13 ENCOUNTER — Encounter (HOSPITAL_COMMUNITY): Payer: Self-pay

## 2021-06-13 ENCOUNTER — Ambulatory Visit (HOSPITAL_COMMUNITY): Payer: PPO

## 2021-06-13 ENCOUNTER — Other Ambulatory Visit: Payer: Self-pay

## 2021-06-13 DIAGNOSIS — M25661 Stiffness of right knee, not elsewhere classified: Secondary | ICD-10-CM

## 2021-06-13 DIAGNOSIS — R2689 Other abnormalities of gait and mobility: Secondary | ICD-10-CM

## 2021-06-13 DIAGNOSIS — M25561 Pain in right knee: Secondary | ICD-10-CM | POA: Diagnosis not present

## 2021-06-13 NOTE — Therapy (Addendum)
[facility] [address] [place], [place], [address] Phone: [phone]   Fax:  [phone]  Physical Therapy Treatment  Patient Details  Name: Christopher [name] MRN: [id number] Date of Birth: 01-31-1956 Referring Provider (PT): [name] MD  PHYSICAL THERAPY DISCHARGE SUMMARY  Visits from Start of Care: 3  Current functional level related to goals / functional outcomes: See below   Remaining deficits: See below   Education / Equipment: See assessment    Patient agrees to discharge. Patient goals were met. Patient is being discharged due to meeting the stated rehab goals.   Encounter Date: 06/13/2021   PT End of Session - 06/13/21 1334     Visit Number 3    Number of Visits 12    Date for PT Re-Evaluation 07/16/21    Authorization Type [employer]    Progress Note Due on Visit 10    PT Start Time 1320    PT Stop Time 1358    PT Time Calculation (min) 38 min    Activity Tolerance Patient tolerated treatment well    Behavior During Therapy WFL for tasks assessed/performed             Past Medical History:  Diagnosis Date   Anxiety    Arthritis    Asthma    BPH (benign prostatic hyperplasia)    Complication of anesthesia    pt had a hard time being able to move after spinal anesthesia , 3-4 hours   Depression    GERD (gastroesophageal reflux disease)    Gout    no meds   Headache(784.0)    otc meds prn   Heart murmur    dx as a child, no problems as an adult   Hyperlipidemia    IBS (irritable bowel syndrome)    PONV (postoperative nausea and vomiting)    Pre-diabetes    Borderline, diet and exercise, no med   Sleep apnea    uses CIPAP machine at night   Wears partial dentures    bottom partial    Past Surgical History:  Procedure Laterality Date   APPENDECTOMY     BACK SURGERY  2002   neck and back fusion   BIOPSY  12/27/2015   Procedure: BIOPSY;  Surgeon: Rogene Houston, MD;   [facility];  Service: Endoscopy;;  Fundus biopsies and duodenal biopsies   BIOPSY  02/10/2020   Procedure: BIOPSY;  Surgeon: Rogene Houston, MD;  [facility];  Service: Endoscopy;;  antral   CARDIAC CATHETERIZATION     CARDIAC CATHETERIZATION N/A 09/04/2016   Procedure: Right/Left Heart Cath and Coronary Angiography;  Surgeon: Peter M Martinique, MD;  Location: [facility];  Service: Cardiovascular;  Laterality: N/A;   CHOLECYSTECTOMY     CHONDROPLASTY  08/15/2011   Procedure: CHONDROPLASTY;  Surgeon: [name], MD;  Location: [facility];  Service: Orthopedics;  Laterality: Left;   COLONOSCOPY  06/27/2011   Procedure: COLONOSCOPY;  Surgeon: Rogene Houston, MD;  [facility];  Service: Endoscopy;  Laterality: N/A;  9:00 / Pt to be here at 9am for 10:45 procedure, benign polyps removed   COLONOSCOPY N/A 10/05/2014   Procedure: COLONOSCOPY;  Surgeon: Rogene Houston, MD;  [facility];  Service: Endoscopy;  Laterality: N/A;  930   COLONOSCOPY N/A 02/11/2018   Procedure: COLONOSCOPY;  Surgeon: Rogene Houston, MD;  [facility];  Service: Endoscopy;  Laterality: N/A;  830   ESOPHAGOGASTRODUODENOSCOPY N/A 12/27/2015   Procedure: ESOPHAGOGASTRODUODENOSCOPY (EGD);  Surgeon: Rogene Houston, MD;  [facility];  Service: Endoscopy;  Laterality: N/A;  3:00   ESOPHAGOGASTRODUODENOSCOPY (EGD) WITH PROPOFOL N/A 02/10/2020   Procedure: ESOPHAGOGASTRODUODENOSCOPY (EGD) WITH PROPOFOL;  Surgeon: Rogene Houston, MD;  [facility];  Service: Endoscopy;  Laterality: N/A;  155   HERNIA REPAIR     umbilical hernia   JOINT REPLACEMENT     knee and shoulder   KNEE ARTHROSCOPY     left knee   KNEE ARTHROSCOPY     right knee    LUMBAR LAMINECTOMY/DECOMPRESSION MICRODISCECTOMY  09/14/2012   Procedure: LUMBAR LAMINECTOMY/DECOMPRESSION MICRODISCECTOMY 1 LEVEL;  Surgeon: Floyce Stakes, MD;  Location: [facility];  Service:  Neurosurgery;  Laterality: Right;  Right Lumbar three-four Diskectomy   neck fusion  2005   POLYPECTOMY  02/11/2018   Procedure: POLYPECTOMY;  Surgeon: Rogene Houston, MD;  [facility];  Service: Endoscopy;;  colon   REVISION TOTAL SHOULDER TO REVERSE TOTAL SHOULDER Left 12/16/2018   Procedure: REVISION TOTAL SHOULDER TO REVERSE TOTAL SHOULDER;  Surgeon: Meredith Pel, MD;  Location: [facility];  Service: Orthopedics;  Laterality: Left;   SHOULDER ARTHROSCOPY WITH BICEPSTENOTOMY Left 09/23/2013   Procedure: SHOULDER ARTHROSCOPY WITH BICEPSTENOTOMY AND EXTENSIVE DEBRIDEMENT;  Surgeon: Carole Civil, MD;  Location: [facility];  Service: Orthopedics;  Laterality: Left;   SHOULDER ARTHROSCOPY WITH ROTATOR CUFF REPAIR Left 05/05/2014   Procedure: SHOULDER ARTHROSCOPY LIMITED DEBRIDEMENT;  Surgeon: Carole Civil, MD;  Location[facility];  Service: Orthopedics;  Laterality: Left;   SHOULDER OPEN ROTATOR CUFF REPAIR Left 05/05/2014   Procedure: ROTATOR CUFF REPAIR SHOULDER OPEN;  Surgeon: Carole Civil, MD;  Location: [facility];  Service: Orthopedics;  Laterality: Left;   SHOULDER OPEN ROTATOR CUFF REPAIR Left 12/16/2018   Procedure: LEFT SHOULDER POSSIBLE SUBSCAPULARIS REPAIR VS. REVISION TO REVERSE TOTAL SHOULDER REPLACEMENT;  Surgeon: Meredith Pel, MD;  Location: [facility];  Service: Orthopedics;  Laterality: Left;   SHOULDER SURGERY Right    Open Mumford procedure   SPINAL FUSION     x2, 2003 and 2007   TOTAL KNEE ARTHROPLASTY Left 06/16/2017   Procedure: LEFT TOTAL KNEE ARTHROPLASTY;  Surgeon: Carole Civil, MD;  Location: [facility];  Service: Orthopedics;  Laterality: Left;   TOTAL KNEE ARTHROPLASTY Right 05/14/2021   Procedure: TOTAL KNEE ARTHROPLASTY;  Surgeon: Carole Civil, MD;  Location: [facility];  Service: Orthopedics;  Laterality: Right;   TOTAL SHOULDER ARTHROPLASTY Left 08/19/2018   Procedure: left shoulder replacement;  Surgeon: Meredith Pel, MD;   Location: [facility];  Service: Orthopedics;  Laterality: Left;    There were no vitals filed for this visit.   Subjective Assessment - 06/13/21 1324     Subjective Pt stated he is feeling good.  Feels he does not any more therapy as is doing it all at home.    Pertinent History RT TKA 05/14/21    Patient Stated Goals get rid of stiffness/ soreness    Currently in Pain? Yes    Pain Score 5     Pain Location Knee    Pain Orientation Right;Anterior    Pain Descriptors / Indicators Tightness    Pain Onset More than a month ago    Pain Frequency Intermittent    Aggravating Factors  bending, walking, standing    Pain Relieving Factors exercises, rest, ice, meds  [facility] PT Assessment - 06/13/21 0001       Assessment   Medical Diagnosis RT TKA    Referring Provider (PT) [name] MD    Onset Date/Surgical Date 05/14/21      Observation/Other Assessments   Focus on Therapeutic Outcomes (FOTO)  78% functional   was 58%     AROM   AROM Assessment Site Knee    Right/Left Knee Right    Right Knee Extension 0   was 5   Right Knee Flexion 122   was 105     Strength   Right Hip Flexion 5/5    Right Hip Extension 4+/5    Right Hip ABduction 5/5    Left Hip Flexion 5/5    Left Hip ABduction 5/5    Right Knee Flexion 4+/5   was 4-   Right Knee Extension 4+/5   was 4-   Left Ankle Dorsiflexion 4+/5      Ambulation/Gait   Ambulation/Gait Yes    Ambulation/Gait Assistance 6: Modified independent (Device/Increase time)    Ambulation Distance (Feet) 452 Feet   was 395   Assistive device None    Gait Pattern Within Functional Limits    Ambulation Surface Level;Indoor    Stairs Yes    Stairs Assistance 7: Independent    Stair Management Technique One rail Right    Number of Stairs 20   5RT   Height of Stairs 7    Gait Comments 2MWT      Static Standing Balance   Static Standing Balance -  Activities  Single Leg Stance - Right Leg;Single Leg Stance - Left  Leg;Tandam Stance - Right Leg;Tandam Stance - Left Leg                           [facility] Adult PT Treatment/Exercise - 06/13/21 0001       Knee/Hip Exercises: Stretches   Active Hamstring Stretch 3 reps;30 seconds    Active Hamstring Stretch Limitations standing 12in step height    Knee: Self-Stretch to increase Flexion Right;5 reps;10 seconds    Knee: Self-Stretch Limitations knee drive for flexion on 12in step      Knee/Hip Exercises: Aerobic   Recumbent Bike 5 min dynamic warmup L5 seat 9      Knee/Hip Exercises: Standing   Stairs 5RT 7in step height    SLS Lt 50", Rt 60"+ first attempt    Other Standing Knee Exercises tandem stance on foam x 1 min                       PT Short Term Goals - 06/13/21 1327       PT SHORT TERM GOAL #1   Title Patient will be independent with initial HEP and self-management strategies to improve functional outcomes    Baseline 10/13: Reports compliance with HEP daily.    Status Achieved      PT SHORT TERM GOAL #2   Title Patient will be able to maintain single leg stance >20 seconds on BLEs to improve stability and functional ADLs i.e. stepping into truck/ lawn mower    Baseline 06/13/21:               PT Long Term Goals - 06/13/21 1327       PT LONG TERM GOAL #1   Title Patient will improve FOTO score to predicted value to indicate improvement in functional outcomes  Baseline 10/13:  Improved FOTO score by 20 points 78% was 58%    Status Achieved      PT LONG TERM GOAL #2   Title Patient will have RT knee AROM 0-120 degrees to improve functional mobility and facilitate squatting to pick up items from floor.    Baseline 06/13/21: AROM 1-122 degrees (was 5-105 degrees)    Status Achieved      PT LONG TERM GOAL #3   Title Patient will have equal to 5/5 MMT throughout RLE to improve ability to perform functional mobility, stair ambulation and ADLs.    Baseline 10/13:  see MMT    Status Partially Met       PT LONG TERM GOAL #4   Title Patient will be able to maintain RT single leg stance on compliant surface >20 seconds to demo improved stability and ability to perform functional ADLs i.e.  stepping onto lawn mower/ ambulating in grass    Baseline 10/13: Able to maintain SLS >50" BLE static; able to maintain 12-15" SLS on foam; Reports ability to mow grass for 3 hours without difficulty    Status Achieved                   Plan - 06/13/21 1428     Clinical Impression Statement Pt stated at entrance he feels ready for DC. Reviewed goals with the following improvements:  Reports compliance iwht HEP daily wiht ability to complete all ADLs wihtout issues.  Improved ROM, strength, balance, gait and ability to demonstrate reciprocal pattern with 1 HR wihtout difficulty.  Reviewed exercises to continue at home wiht verbalized understanding.  Improved self perceived functional ability score to 78% was 58% initial eval.    Examination-Activity Limitations Bend;Stand;Lift;Stairs;Squat;Sleep;Transfers;Locomotion Level    Examination-Participation Restrictions Cleaning;Yard Work;Community Activity;Shop    Stability/Clinical Decision Making Stable/Uncomplicated    Clinical Decision Making Low    Rehab Potential Good    PT Frequency 2x / week    PT Duration 6 weeks    PT Treatment/Interventions ADLs/Self Care Home Management;Biofeedback;Cryotherapy;Fluidtherapy;Parrafin;Therapeutic activities;Functional mobility training;Patient/family education;Manual techniques;Manual lymph drainage;Energy conservation;Dry needling;Splinting;Spinal Manipulations;Visual/perceptual remediation/compensation;Passive range of motion;Scar mobilization;Orthotic Fit/Training;Compression bandaging;DME Instruction;Therapeutic exercise;Balance training;Neuromuscular re-education;Gait training;Stair training;Iontophoresis 63m/ml Dexamethasone;Contrast Bath;Electrical Stimulation;Moist Heat;Traction;Ultrasound;Vasopneumatic  Device;Taping;Joint Manipulations;Vestibular    PT Next Visit Plan DC to HEP.    PT Home Exercise Plan Eval: quad set, SLR, sit to stand, heel slide, HS stretch and HH HEP review 10/11: tandem stance, sidestepping at counter, FWD step up    Consulted and Agree with Plan of Care Patient             Patient will benefit from skilled therapeutic intervention in order to improve the following deficits and impairments:  Abnormal gait, Pain, Improper body mechanics, Increased fascial restricitons, Decreased mobility, Decreased scar mobility, Decreased activity tolerance, Decreased balance, Difficulty walking, Increased edema, Impaired flexibility, Decreased strength, Hypomobility, Decreased range of motion  Visit Diagnosis: Right knee pain, unspecified chronicity  Stiffness of right knee, not elsewhere classified  Other abnormalities of gait and mobility     Problem List Patient Active Problem List   Diagnosis Date Noted   Status post total right knee replacement 05/14/2021   Primary osteoarthritis of right knee    Renal cyst 10/03/2020   Frequency of micturition 10/03/2020   Benign prostatic hyperplasia with urinary obstruction 10/03/2020   IBS (irritable bowel syndrome) 07/05/2020   Lumbar disc herniation 06/29/2019   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HCC)    Primary osteoarthritis, left shoulder  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   History of colonic polyps 11/26/2017   S/P total knee replacement, left 06/16/17 06/16/2017   Primary osteoarthritis of left knee    Hyperlipidemia 09/04/2016   Angina pectoris ([place]) 09/04/2016   OSA (obstructive sleep apnea) 09/04/2016   COPD (chronic obstructive pulmonary disease) ([place]) 09/04/2016   Rotator cuff tear 05/05/2014   S/P shoulder surgery 09/26/2013   Arthritis, shoulder region 09/26/2013   Bursitis, shoulder 09/26/2013   Synovitis of shoulder 09/26/2013   Labral tear of shoulder, degenerative 09/26/2013   Biceps tendon  tear 09/26/2013   Rotator cuff syndrome of left shoulder 06/21/2013   Arthritis 06/21/2013   Patellar tendinitis 03/29/2013   Effusion of knee joint 03/29/2013   Bursitis/tendonitis, shoulder 03/10/2013   Effusion of knee joint, left 08/18/2011   Knee pain 08/18/2011   Acute torn meniscus 07/30/2011   Old torn meniscus of knee 07/30/2011   ARTHRITIS, LEFT KNEE 10/15/2010   MEDIAL MENISCUS TEAR, RIGHT 10/15/2010   HIP PAIN 01/15/2010   DEGENERATIVE DISC DISEASE, LUMBOSACRAL SPINE W/RADICULOPATHY 03/70/9643   PLICA SYNDROME 83/81/8403   DERANGEMENT MENISCUS 07/09/2009   JOINT EFFUSION, LEFT KNEE 07/09/2009   Unilateral primary osteoarthritis, left knee 01/30/2009   KNEE PAIN 01/30/2009   ANKLE SPRAIN, RIGHT 11/08/2007   Ihor Austin, LPTA/CLT; CBIS [phone]  Aldona Lento, PTA 06/13/2021, 4:46 PM  [facility] [place], [place], [address] Phone: [phone]   Fax:  [phone]  Name: Christopher [name] MRN: [id number] Date of Birth: 08/25/1956

## 2021-06-18 ENCOUNTER — Encounter (HOSPITAL_COMMUNITY): Payer: PPO

## 2021-06-19 ENCOUNTER — Ambulatory Visit (INDEPENDENT_AMBULATORY_CARE_PROVIDER_SITE_OTHER): Payer: PPO | Admitting: Orthopedic Surgery

## 2021-06-19 ENCOUNTER — Other Ambulatory Visit: Payer: Self-pay

## 2021-06-19 DIAGNOSIS — Z96651 Presence of right artificial knee joint: Secondary | ICD-10-CM

## 2021-06-19 MED ORDER — HYDROCODONE-ACETAMINOPHEN 10-325 MG PO TABS: 1.0000 | ORAL_TABLET | Freq: Four times a day (QID) | ORAL | 0 refills | Status: AC | PRN

## 2021-06-19 NOTE — Progress Notes (Signed)
Chief Complaint  Patient presents with   Procedure    Right total knee    Encounter Diagnosis  Name Primary?   S/P TKR (total knee replacement), right September 13 Yes    Christopher [name] is at his 6-week postop appointment he is returned to all normal activity he has some intermittent pain depends on activity level  He has full extension he has 120 degrees of flexion his knee feels stable I still see some swelling I like him to continue icing it especially after activity and I refilled his medication follow-up 6 weeks  Meds ordered this encounter  Medications   HYDROcodone-acetaminophen (NORCO) 10-325 MG tablet    Sig: Take 1 tablet by mouth every 6 (six) hours as needed for up to 5 days.    Dispense:  20 tablet    Refill:  0

## 2021-06-20 ENCOUNTER — Encounter (HOSPITAL_COMMUNITY): Payer: PPO

## 2021-06-25 ENCOUNTER — Encounter (HOSPITAL_COMMUNITY): Payer: PPO

## 2021-06-27 ENCOUNTER — Encounter (HOSPITAL_COMMUNITY): Payer: PPO | Admitting: Physical Therapy

## 2021-07-02 DIAGNOSIS — Z96651 Presence of right artificial knee joint: Secondary | ICD-10-CM | POA: Insufficient documentation

## 2021-07-04 ENCOUNTER — Other Ambulatory Visit: Payer: Self-pay

## 2021-07-04 ENCOUNTER — Ambulatory Visit (INDEPENDENT_AMBULATORY_CARE_PROVIDER_SITE_OTHER): Payer: PPO | Admitting: Orthopedic Surgery

## 2021-07-04 ENCOUNTER — Encounter: Payer: Self-pay | Admitting: Orthopedic Surgery

## 2021-07-04 ENCOUNTER — Ambulatory Visit: Payer: PPO

## 2021-07-04 VITALS — Ht 66.0 in | Wt 223.0 lb

## 2021-07-04 DIAGNOSIS — M25561 Pain in right knee: Secondary | ICD-10-CM

## 2021-07-04 DIAGNOSIS — Z96651 Presence of right artificial knee joint: Secondary | ICD-10-CM | POA: Diagnosis not present

## 2021-07-04 DIAGNOSIS — G8918 Other acute postprocedural pain: Secondary | ICD-10-CM | POA: Diagnosis not present

## 2021-07-04 MED ORDER — HYDROCODONE-ACETAMINOPHEN 10-325 MG PO TABS: 1.0000 | ORAL_TABLET | ORAL | 0 refills | Status: AC | PRN

## 2021-07-04 NOTE — Progress Notes (Signed)
Chief Complaint  Patient presents with   Routine Post Op    Rt knee pain after he twisted it 2 wks ago. Wife also states knee has been warm to the touch DOS 05/14/21   Medication Refill    Hydrocodone    Trauma to right knee 2 weeks ago  Was doing well until he got the foot caught and twisted the knee   Now c/o pain medial and anterior part of the knee   Hurts when he walks   No fever   He had a lot of pain when he walked in [employer]   Physical Exam Constitutional:      General: He is not in acute distress.    Appearance: He is well-developed.     Comments: Well developed, well nourished Normal grooming and hygiene     Cardiovascular:     Comments: No peripheral edema Musculoskeletal:     Comments: Right knee looks fine   No effusion   0-115 rom   Tender MCL and anteromedial tibia   No laxity in any plane     Skin:    General: Skin is warm and dry.     Capillary Refill: Capillary refill takes less than 2 seconds.  Neurological:     Mental Status: He is alert and oriented to person, place, and time.     Sensory: No sensory deficit.     Coordination: Coordination normal.     Gait: Gait abnormal.     Deep Tendon Reflexes: Reflexes are normal and symmetric.     Comments: He has a slight limp   Psychiatric:        Mood and Affect: Mood normal.        Behavior: Behavior normal.        Thought Content: Thought content normal.        Judgment: Judgment normal.     Comments: Affect normal      X-rays -   Meds ordered this encounter  Medications   HYDROcodone-acetaminophen (NORCO) 10-325 MG tablet    Sig: Take 1 tablet by mouth every 4 (four) hours as needed for up to 5 days.    Dispense:  30 tablet    Refill:  0   Encounter Diagnoses  Name Primary?   S/P TKR (total knee replacement), right 05/14/21 Yes   Acute pain of right knee    Post-operative pain     I m going to send some lab work off just to r/o infection, clinically there s no sign of it   I   just want him to rest and take his medicine and let things calm down

## 2021-07-05 DIAGNOSIS — Z96651 Presence of right artificial knee joint: Secondary | ICD-10-CM | POA: Diagnosis not present

## 2021-07-05 DIAGNOSIS — G8918 Other acute postprocedural pain: Secondary | ICD-10-CM | POA: Diagnosis not present

## 2021-07-05 DIAGNOSIS — M25561 Pain in right knee: Secondary | ICD-10-CM | POA: Diagnosis not present

## 2021-07-09 LAB — CBC WITH DIFFERENTIAL/PLATELET
Absolute Monocytes: 639 cells/uL (ref 200–950)
Basophils Absolute: 33 cells/uL (ref 0–200)
Basophils Relative: 0.4 %
Eosinophils Absolute: 199 cells/uL (ref 15–500)
Eosinophils Relative: 2.4 %
HCT: 39.6 % (ref 38.5–50.0)
Hemoglobin: 13.6 g/dL (ref 13.2–17.1)
Lymphs Abs: 2855 cells/uL (ref 850–3900)
MCH: 28.2 pg (ref 27.0–33.0)
MCHC: 34.3 g/dL (ref 32.0–36.0)
MCV: 82.2 fL (ref 80.0–100.0)
MPV: 10.2 fL (ref 7.5–12.5)
Monocytes Relative: 7.7 %
Neutro Abs: 4573 cells/uL (ref 1500–7800)
Neutrophils Relative %: 55.1 %
Platelets: 247 10*3/uL (ref 140–400)
RBC: 4.82 10*6/uL (ref 4.20–5.80)
RDW: 14 % (ref 11.0–15.0)
Total Lymphocyte: 34.4 %
WBC: 8.3 10*3/uL (ref 3.8–10.8)

## 2021-07-09 LAB — C-REACTIVE PROTEIN: CRP: 20.9 mg/L — ABNORMAL HIGH (ref <8.0)

## 2021-07-09 LAB — SEDIMENTATION RATE: Sed Rate: 6 mm/h (ref 0–20)

## 2021-07-11 ENCOUNTER — Ambulatory Visit: Payer: PPO | Admitting: Orthopedic Surgery

## 2021-07-11 DIAGNOSIS — Z23 Encounter for immunization: Secondary | ICD-10-CM | POA: Diagnosis not present

## 2021-07-11 DIAGNOSIS — Z125 Encounter for screening for malignant neoplasm of prostate: Secondary | ICD-10-CM | POA: Diagnosis not present

## 2021-07-11 DIAGNOSIS — J449 Chronic obstructive pulmonary disease, unspecified: Secondary | ICD-10-CM | POA: Diagnosis not present

## 2021-07-11 DIAGNOSIS — Z136 Encounter for screening for cardiovascular disorders: Secondary | ICD-10-CM | POA: Diagnosis not present

## 2021-07-11 DIAGNOSIS — Z0000 Encounter for general adult medical examination without abnormal findings: Secondary | ICD-10-CM | POA: Diagnosis not present

## 2021-07-11 DIAGNOSIS — R7302 Impaired glucose tolerance (oral): Secondary | ICD-10-CM | POA: Diagnosis not present

## 2021-07-11 DIAGNOSIS — Z13220 Encounter for screening for lipoid disorders: Secondary | ICD-10-CM | POA: Diagnosis not present

## 2021-07-11 DIAGNOSIS — Z96651 Presence of right artificial knee joint: Secondary | ICD-10-CM

## 2021-07-18 ENCOUNTER — Encounter: Payer: Self-pay | Admitting: Orthopedic Surgery

## 2021-07-18 ENCOUNTER — Other Ambulatory Visit: Payer: Self-pay

## 2021-07-18 ENCOUNTER — Ambulatory Visit (INDEPENDENT_AMBULATORY_CARE_PROVIDER_SITE_OTHER): Payer: PPO | Admitting: Orthopedic Surgery

## 2021-07-18 DIAGNOSIS — M659 Synovitis and tenosynovitis, unspecified: Secondary | ICD-10-CM

## 2021-07-18 MED ORDER — METHYLPREDNISOLONE 4 MG PO TABS
ORAL_TABLET | ORAL | 1 refills | Status: DC
Start: 2021-07-18 — End: 2022-02-04

## 2021-07-18 NOTE — Progress Notes (Signed)
Chief Complaint  Patient presents with   Post-op Follow-up    Right total knee 05/14/21 fall on knee mid October    Christopher [name] missed his last appointment he forgot  He says that he is a little bit better.  However, he says after he is walked on it about 4 hours he has severe pain in the proximal tibia  The knee is no longer swollen or warm to touch  His sed rate was normal C-reactive protein elevated White count normal  's findings are inconclusive  Based on the way his knee looks now it does not look infected its not warm he can bend and straighten it he is walking on it normally but again after walking on it for several hours he says it will hurt and the pain medication which is hydrocodone 10 mg does not help.  (He has been on that off and on for approximately 10 years)  Further examination of the knee shows that he does have some tender areas 1 is the iliotibial band 1 is the medial joint line 1 is the proximal tibia  At this point I am not sure what to do other than follow him closely for symptoms swelling to see if we can get some fluid that we can send off for culture and sensitivity and cell count  I am going to try him on a Medrol Dosepak to see if this is just some type of reactive synovitis  Again this all started with trauma after he was functioning absolutely 100% prior to the trauma Encounter Diagnosis  Name Primary?   Synovitis of knee Yes   Meds ordered this encounter  Medications   methylPREDNISolone (MEDROL) 4 MG tablet    Sig: Take 6,5,4,3,2,1 over 6 days : 3 bid day 1, 3 am and 2 pm day 2, 2 am and 2 pm day 3, 2 am 1 pm day 4, 1 am 1 pm day 5 and 1 day 6    Dispense:  21 tablet    Refill:  1    Fu 1 mo

## 2021-07-27 ENCOUNTER — Ambulatory Visit (INDEPENDENT_AMBULATORY_CARE_PROVIDER_SITE_OTHER): Payer: PPO | Admitting: Neurology

## 2021-07-27 ENCOUNTER — Other Ambulatory Visit: Payer: Self-pay

## 2021-07-27 DIAGNOSIS — R0681 Apnea, not elsewhere classified: Secondary | ICD-10-CM

## 2021-07-27 DIAGNOSIS — J449 Chronic obstructive pulmonary disease, unspecified: Secondary | ICD-10-CM

## 2021-07-27 DIAGNOSIS — G4733 Obstructive sleep apnea (adult) (pediatric): Secondary | ICD-10-CM

## 2021-07-27 DIAGNOSIS — F17200 Nicotine dependence, unspecified, uncomplicated: Secondary | ICD-10-CM

## 2021-07-27 DIAGNOSIS — G4720 Circadian rhythm sleep disorder, unspecified type: Secondary | ICD-10-CM

## 2021-07-27 DIAGNOSIS — R0683 Snoring: Secondary | ICD-10-CM

## 2021-07-27 DIAGNOSIS — E669 Obesity, unspecified: Secondary | ICD-10-CM

## 2021-07-29 ENCOUNTER — Other Ambulatory Visit: Payer: Self-pay

## 2021-07-29 ENCOUNTER — Ambulatory Visit (INDEPENDENT_AMBULATORY_CARE_PROVIDER_SITE_OTHER): Payer: PPO | Admitting: Orthopedic Surgery

## 2021-07-29 DIAGNOSIS — M25461 Effusion, right knee: Secondary | ICD-10-CM

## 2021-07-29 DIAGNOSIS — Z96651 Presence of right artificial knee joint: Secondary | ICD-10-CM

## 2021-07-29 MED ORDER — HYDROCODONE-ACETAMINOPHEN 10-325 MG PO TABS: 1.0000 | ORAL_TABLET | Freq: Four times a day (QID) | ORAL | 0 refills | Status: DC | PRN

## 2021-07-29 MED ORDER — PREGABALIN 50 MG PO CAPS: 50.0000 mg | ORAL_CAPSULE | Freq: Two times a day (BID) | ORAL | 1 refills | Status: DC

## 2021-07-29 NOTE — Progress Notes (Signed)
Chief Complaint  Patient presents with   Knee Pain    DOS 05/14/21 Knee is painful and swelling. Pt states he never received the hydrocodone sent in at the last visit   Follow-up for Mr. Gago is still having pain and swelling of the right knee  He says he has a lot of pain at night and his tibia and then he has pain in his knee across the front of the knee during the day and with weightbearing  He had a right total knee on May 14, 2021 he was doing well until he injured his knee on October 18 or 19 around that time.  He had labs done with a normal CBC white count of 8.3 sed rate was 6 CRP was elevated 20.9  At that time was nothing to aspirate  He comes in with an unscheduled visit today as I asked him to come back when it swollen and it is  I was able to aspirate the knee today 12 cc of amber-colored fluid no signs of purulence knee looks fine on the outside.  He has pain with knee flexion and tightness in the front of the knee he has numbness on the lateral side of the knee which is somewhat usual.  The tibial pain and the pain rating down the leg at night probably when he is on his back with the back in extension could be related to his lumbar spine disease although not clear at this time  If we rule him out for infection then we have to worry about tibial loosening although imaging at this time does not suggest that either.  I will see him after his laboratory studies are back and we gave him some prescriptions  Meds ordered this encounter  Medications   HYDROcodone-acetaminophen (NORCO) 10-325 MG tablet    Sig: Take 1 tablet by mouth every 6 (six) hours as needed.    Dispense:  30 tablet    Refill:  0   pregabalin (LYRICA) 50 MG capsule    Sig: Take 1 capsule (50 mg total) by mouth 2 (two) times daily.    Dispense:  60 capsule    Refill:  1

## 2021-08-01 ENCOUNTER — Telehealth: Payer: Self-pay | Admitting: Orthopedic Surgery

## 2021-08-01 NOTE — Telephone Encounter (Signed)
Patient called - checking on lab results; please advise.

## 2021-08-03 LAB — WOUND CULTURE
MICRO NUMBER:: 12683937
RESULT:: NO GROWTH
SPECIMEN QUALITY:: ADEQUATE

## 2021-08-03 LAB — SYNOVIAL FLUID ANALYSIS, COMPLETE
Basophils, %: 0 %
Eosinophils-Synovial: 2 % (ref 0–2)
Lymphocytes-Synovial Fld: 9 % (ref 0–74)
Monocyte/Macrophage: 5 % (ref 0–69)
Neutrophil, Synovial: 84 % — ABNORMAL HIGH (ref 0–24)
Synoviocytes, %: 0 % (ref 0–15)
WBC, Synovial: 1350 cells/uL — ABNORMAL HIGH (ref <150)

## 2021-08-12 NOTE — Addendum Note (Signed)
Addended by: Star Age on: 08/12/2021 06:32 PM   Modules accepted: Orders

## 2021-08-12 NOTE — Procedures (Signed)
PATIENT'S NAME:  Christopher, [name] DOB:      August 27, 1956      MR#:    [id number]     DATE OF RECORDING: 07/27/2021 REFERRING M.D.:  Christopher Antigua, MD Study Performed:  Split-Night Titration Study HISTORY: 65 year old man with a history of COPD, smoking, arthritis, reflux disease, gout, prediabetes, hyperlipidemia, irritable bowel syndrome, anxiety, depression, and obesity, who was previously diagnosed with obstructive sleep apnea and placed on CPAP therapy.  He has not been using his CPAP machine due to lack of supplies. He presents for re-evaluation, prior sleep testing was approximately 12 to 15 years ago. The patient endorsed the Epworth Sleepiness Scale at 9 points. The patient's weight 223 pounds with a height of 66 (inches), resulting in a BMI of 35.8 kg/m2. The patient's neck circumference measured 19.5 inches.  CURRENT MEDICATIONS: EpiPen JR, Norco, Nitrostat, Protonix, ProAir HFA, Crestor, Zoloft, Restoril, CPAP  PROCEDURE:  This is a multichannel digital polysomnogram utilizing the Somnostar 11.2 system.  Electrodes and sensors were applied and monitored per AASM Specifications.   EEG, EOG, Chin and Limb EMG, were sampled at 200 Hz.  ECG, Snore and Nasal Pressure, Thermal Airflow, Respiratory Effort, CPAP Flow and Pressure, Oximetry was sampled at 50 Hz. Digital video and audio were recorded.      BASELINE STUDY WITHOUT CPAP RESULTS:  Lights Out was at 20:48 and Lights On at 04:49 for the night, split start at 00:01, epoch 403.  Total recording time (TRT) was 197.5, with a total sleep time (TST) of 124.5 minutes.   The patient's sleep latency was 50 minutes.  REM sleep was absent. The sleep efficiency was 63. %.    SLEEP ARCHITECTURE: WASO (Wake after sleep onset) was 24 minutes with moderate sleep fragmentation noted. Stage N1 was 9 minutes, Stage N2 was 115.5 minutes, Stage N3 was 0 minutes and Stage R (REM sleep) was 0 minutes.  The percentages were Stage N1 7.2%, Stage N2 92.8%, which is  markedly increased, Stage N3 and Stage R (REM sleep) were absent. The arousals were noted as: 58 were spontaneous, 0 were associated with PLMs, 97 were associated with respiratory events.  RESPIRATORY ANALYSIS:  There were a total of 101 respiratory events:  71 obstructive apneas, 0 central apneas and 1 mixed apneas with a total of 72 apneas and an apnea index (AI) of 34.7. There were 29 hypopneas with a hypopnea index of 14.. The patient also had 0 respiratory event related arousals (RERAs).  Snoring was noted.     The total APNEA/HYPOPNEA INDEX (AHI) was 48.7 /hour and the total RESPIRATORY DISTURBANCE INDEX was 48.7 /hour.  0 events occurred in REM sleep and 59 events in NREM. The REM AHI was n/a, /hour versus a non-REM AHI of 48.7 /hour. The patient spent 170.5 minutes sleep time in the supine position 165 minutes in non-supine. The supine AHI was 73.3 /hour versus a non-supine AHI of 6.5 /hour.  OXYGEN SATURATION & C02:  The wake baseline 02 saturation was 90%, with the lowest being 89%. Time spent below 89% saturation equaled 0 minutes. PERIODIC LIMB MOVEMENTS: The patient had a total of 0 Periodic Limb Movements.  The Periodic Limb Movement (PLM) index was 0 /hour and the PLM Arousal index was 0 /hour.  Audio and video analysis did not show any abnormal or unusual movements, behaviors, phonations or vocalizations. The patient took 1 bathroom break for the night. Mild to loud snoring was noted. The EKG was in keeping with normal sinus rhythm (  NSR).   TITRATION STUDY WITH CPAP RESULTS:   The patient was fitted with a small Vitera FFM. CPAP was initiated at 5 cmH20 with heated humidity per AASM split night standards and pressure was advanced to 14 cmH20 because of hypopneas, apneas and desaturations.  At a PAP pressure of 14 cmH20, there was a reduction of the AHI to 1.7/hour with non-supine REM sleep achieved and O2 nadir of 90%.   Total recording time (TRT) was 284.5 minutes, with a total sleep  time (TST) of 210.5 minutes. The patient's sleep latency was 44.5 minutes. REM latency was 62 minutes.  The sleep efficiency was 74. %.    SLEEP ARCHITECTURE: Wake after sleep was 41.5 minutes, Stage N1 21.5 minutes, Stage N2 114 minutes, Stage N3 1 minutes and Stage R (REM sleep) 74 minutes. The percentages were: Stage N1 10.2%, Stage N2 54.2%, Stage N3 .5% and Stage R (REM sleep) 35.2%, which is increased, in keeping with rebound. The arousals were noted as: 58 were spontaneous, 0 were associated with PLMs, 34 were associated with respiratory events.  RESPIRATORY ANALYSIS:  There were a total of 48 respiratory events: 34 obstructive apneas, 0 central apneas and 1 mixed apneas with a total of 35 apneas and an apnea index (AI) of 10.. There were 13 hypopneas with a hypopnea index of 3.7 /hour. The patient also had 0 respiratory event related arousals (RERAs).      The total APNEA/HYPOPNEA INDEX  (AHI) was 13.7 /hour and the total RESPIRATORY DISTURBANCE INDEX was 13.7 /hour.  20 events occurred in REM sleep and 28 events in NREM. The REM AHI was 16.2 /hour versus a non-REM AHI of 12.3 /hour. REM sleep was achieved on a pressure of  cm/h2o (AHI was  .) The patient spent 44% of total sleep time in the supine position. The supine AHI was 31.3 /hour, versus a non-supine AHI of 0.0/hour.  OXYGEN SATURATION & C02:  The wake baseline 02 saturation was 92%, with the lowest being 84%. Time spent below 89% saturation equaled 6 minutes.  PERIODIC LIMB MOVEMENTS:    The patient had a total of 0 Periodic Limb Movements. The Periodic Limb Movement (PLM) index was 0 /hour and the PLM Arousal index was 0 /hour.  Post-study, the patient indicated that sleep was better than usual.  POLYSOMNOGRAPHY IMPRESSION :   Severe Obstructive Sleep Apnea (OSA)  Dysfunctions associated with sleep stages or arousals from sleep  RECOMMENDATIONS:  This patient has severe obstructive sleep apnea and responded well on CPAP  therapy. Please note, the absence of REM sleep during the baseline portion of the study likely underestimates his AHI and/or O2 nadir. I will recommend home CPAP treatment at a pressure of 14 cm via small Vitera FFM from [facility]. The patient should be reminded to be fully compliant with PAP therapy to improve sleep related symptoms and decrease long term cardiovascular risks. Please note that untreated obstructive sleep apnea can carry additional perioperative morbidity. Patients with significant obstructive sleep apnea should receive perioperative PAP therapy and the surgeons and particularly the anesthesiologist should be informed of the diagnosis and the severity of the sleep disordered breathing. This study shows sleep fragmentation and abnormal sleep stage percentages; these are nonspecific findings and per se do not signify an intrinsic sleep disorder or a cause for the patient's sleep-related symptoms. Causes include (but are not limited to) the first night effect of the sleep study, circadian rhythm disturbances, medication effect or an underlying mood disorder or  medical problem.  The patient should be cautioned not to drive, work at heights, or operate dangerous or heavy equipment when tired or sleepy. Review and reiteration of good sleep hygiene measures should be pursued with any patient. The patient will be seen in follow-up in the sleep clinic at [facility] for discussion of the test results, symptom and treatment compliance review, further management strategies, etc. The referring provider will be notified of the test results.  I certify that I have reviewed the entire raw data recording prior to the issuance of this report in accordance with the Standards of Accreditation of the American Academy of Sleep Medicine (AASM)  Star Age, MD, PhD Diplomat, American Board of Neurology and Sleep Medicine ( Neurology and Sleep Medicine)

## 2021-08-14 ENCOUNTER — Telehealth: Payer: Self-pay | Admitting: *Deleted

## 2021-08-14 ENCOUNTER — Encounter: Payer: Self-pay | Admitting: *Deleted

## 2021-08-14 NOTE — Telephone Encounter (Signed)
Ii called pt, spoke to wife, genevieve on [place].  Gave her the results of sleep study as per Dr. Guadelupe Sabin note below.    Severe OSA. Recommend CPAP.  DME [facility].  She will receive letter retelling results and recommendations. Order sent.  She verbalized understanding.

## 2021-08-14 NOTE — Telephone Encounter (Signed)
-----   Message from Star Age, MD sent at 08/12/2021  6:32 PM EST ----- Patient referred by Dr. Huel Cote for re-eval of his OSA. He has an older CPAP, but needed supplies and a new machine, seen by me on 04/01/21, split night sleep study on 07/27/21. Please call and notify patient that the recent sleep study confirmed the diagnosis of severe OSA. He did well with CPAP during the study with significant improvement of the respiratory events. Therefore, I would like start the patient on a new CPAP machine. I placed the order in the chart.  Please advise patient that we need a follow up appointment with either myself or one of our nurse practitioners in about 10 weeks post set-up to check for how the patient is feeling and how well the patient is using the machine, etc. Please go ahead and schedule the appointment, while you have the patient on the phone and make sure patient understands the importance of keeping this window for the FU appointment, as it is often an insurance requirement. Failing to adhere to this may result in losing coverage for sleep apnea treatment, at which point most patients are left with a choice of returning the machine or paying out of pocket (and we want neither of this to happen!).  Please re-enforce the importance of compliance with treatment and the need for [place] to monitor compliance data - again an insurance requirement and usually a good feedback for the patient as far as how they are doing.  Also remind patient, that any PAP machine or mask issues should be first addressed with the DME company, who provided the machine/mask.  Please ask if patient has a preference regarding DME company, may depend on the insurance too.  Please arrange for CPAP set up at home through a DME company of patient's choice.  Once you have spoken to the patient you can close the phone encounter. Please fax/route report to referring provider, thanks,   Star Age, MD, PhD [facility]  [facility] 2)

## 2021-08-19 ENCOUNTER — Other Ambulatory Visit: Payer: Self-pay | Admitting: Orthopedic Surgery

## 2021-08-19 ENCOUNTER — Ambulatory Visit (INDEPENDENT_AMBULATORY_CARE_PROVIDER_SITE_OTHER): Payer: PPO | Admitting: Orthopedic Surgery

## 2021-08-19 ENCOUNTER — Other Ambulatory Visit: Payer: Self-pay

## 2021-08-19 ENCOUNTER — Encounter: Payer: Self-pay | Admitting: Orthopedic Surgery

## 2021-08-19 ENCOUNTER — Ambulatory Visit: Payer: PPO

## 2021-08-19 DIAGNOSIS — G8929 Other chronic pain: Secondary | ICD-10-CM

## 2021-08-19 DIAGNOSIS — Z96652 Presence of left artificial knee joint: Secondary | ICD-10-CM

## 2021-08-19 DIAGNOSIS — M25562 Pain in left knee: Secondary | ICD-10-CM | POA: Diagnosis not present

## 2021-08-19 DIAGNOSIS — Z96651 Presence of right artificial knee joint: Secondary | ICD-10-CM | POA: Diagnosis not present

## 2021-08-19 NOTE — Patient Instructions (Signed)
SEE ME 1 WEEK AFTER HE SEES DR Mayer Camel

## 2021-08-19 NOTE — Addendum Note (Signed)
Addended byCandice Camp on: 08/19/2021 04:15 PM   Modules accepted: Orders

## 2021-08-19 NOTE — Progress Notes (Signed)
Chief Complaint  Patient presents with   Knee Pain    S/P RT TKA 05/14/21, FELL PAIN SINCE     Mr. Denner is 65 years old he is 12 to 13 weeks after his right total knee he did well until he fell in October.  His lab results are noted below  There is no suggestion of infection at this time.  Continues to complain of pain around his tibia.  Knee aspirate on 07/29/21 1350 WBC 84 N NO CRYSTALS NO ORGANISMS    07/05/21 20.9 CRP ESR 6  WBC 8   December 19 x-rays oblique x-rays AP lateral x-rays show no evidence of loosening or infection  Still unclear as to the cause of this patient's tibial pain  Differential diagnosis indolent infection, loosening of the implant, radiculopathy from lumbosacral spine disease  I would like to get a second opinion he is agreeable after some coaxing  He will call me when he has the second opinion I will see him a week after and we will discuss

## 2021-08-22 DIAGNOSIS — M25561 Pain in right knee: Secondary | ICD-10-CM | POA: Diagnosis not present

## 2021-08-22 DIAGNOSIS — T8484XA Pain due to internal orthopedic prosthetic devices, implants and grafts, initial encounter: Secondary | ICD-10-CM | POA: Diagnosis not present

## 2021-08-22 DIAGNOSIS — M25562 Pain in left knee: Secondary | ICD-10-CM | POA: Diagnosis not present

## 2021-08-29 ENCOUNTER — Other Ambulatory Visit: Payer: Self-pay

## 2021-08-29 ENCOUNTER — Ambulatory Visit (INDEPENDENT_AMBULATORY_CARE_PROVIDER_SITE_OTHER): Payer: PPO | Admitting: Orthopedic Surgery

## 2021-08-29 DIAGNOSIS — Z96651 Presence of right artificial knee joint: Secondary | ICD-10-CM

## 2021-08-29 NOTE — Progress Notes (Signed)
Chief Complaint  Patient presents with   Consult    Discuss 2nd opinion. Patient seen by Dr. Frederik Pear   Encounter Diagnosis  Name Primary?   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 Mayer Camel.  We have no notes.  He says Dr. Mayer Camel says he needs surgery.  We discussed possibilities of doing a patella button on the right knee  Once I get Dr. Damita Dunnings notes I will call Mr. Lia and we will discuss it

## 2021-09-01 HISTORY — PX: JOINT REPLACEMENT: SHX530

## 2021-09-04 ENCOUNTER — Telehealth: Payer: Self-pay | Admitting: Orthopedic Surgery

## 2021-09-04 DIAGNOSIS — Z96651 Presence of right artificial knee joint: Secondary | ICD-10-CM

## 2021-09-04 NOTE — Telephone Encounter (Signed)
Patient requests refill: HYDROcodone-acetaminophen (NORCO) 10-325 MG tablet 30 tablet      [facility]

## 2021-09-05 MED ORDER — HYDROCODONE-ACETAMINOPHEN 10-325 MG PO TABS: 1.0000 | ORAL_TABLET | Freq: Four times a day (QID) | ORAL | 0 refills | Status: DC | PRN

## 2021-10-03 ENCOUNTER — Other Ambulatory Visit: Payer: Self-pay | Admitting: Orthopedic Surgery

## 2021-10-03 DIAGNOSIS — Z96651 Presence of right artificial knee joint: Secondary | ICD-10-CM

## 2021-10-03 MED ORDER — HYDROCODONE-ACETAMINOPHEN 10-325 MG PO TABS: 1.0000 | ORAL_TABLET | Freq: Four times a day (QID) | ORAL | 0 refills | Status: DC | PRN

## 2021-10-03 NOTE — Telephone Encounter (Signed)
Done

## 2021-10-03 NOTE — Telephone Encounter (Signed)
Patient/spouse called this afternoon via voice mail message - I returned call - requests refill. I reviewed refill policy, to request before noon on Thursdays. HYDROcodone-acetaminophen (NORCO) 10-325 MG tablet 30 tablet       [facility]

## 2021-10-07 ENCOUNTER — Telehealth: Payer: Self-pay | Admitting: Orthopedic Surgery

## 2021-10-07 ENCOUNTER — Other Ambulatory Visit: Payer: Self-pay | Admitting: Radiology

## 2021-10-07 NOTE — Telephone Encounter (Signed)
Voice message came to attention regarding right knee, surgical knee - states he was doing well, but may have twisted it a little in the last day or so; said it hurts and is swollen some. States saw the 2nd opinion provider.- okay to schedule here for appointment? Patient  said he thought he was to wait 3 months, but said cannot wait that long.

## 2021-10-08 NOTE — Telephone Encounter (Signed)
Called patient earlier today and scheduled appointment for this week; aware.

## 2021-10-10 ENCOUNTER — Ambulatory Visit (INDEPENDENT_AMBULATORY_CARE_PROVIDER_SITE_OTHER): Payer: PPO | Admitting: Orthopedic Surgery

## 2021-10-10 ENCOUNTER — Encounter: Payer: Self-pay | Admitting: Orthopedic Surgery

## 2021-10-10 ENCOUNTER — Other Ambulatory Visit: Payer: Self-pay

## 2021-10-10 DIAGNOSIS — T8484XS Pain due to internal orthopedic prosthetic devices, implants and grafts, sequela: Secondary | ICD-10-CM | POA: Diagnosis not present

## 2021-10-10 DIAGNOSIS — Z96659 Presence of unspecified artificial knee joint: Secondary | ICD-10-CM

## 2021-10-10 DIAGNOSIS — Z96651 Presence of right artificial knee joint: Secondary | ICD-10-CM | POA: Diagnosis not present

## 2021-10-10 NOTE — Progress Notes (Signed)
Chief Complaint  Patient presents with   Knee Pain    Right getting worse 05/14/21 TKR    Mr. Loadholt comes in still complaining of pain around his knee it seems of localized on the anterolateral aspect just above it at the joint line  The knee is not warm to touch there is no erythema the incision looks good the tenderness seems to be just above the joint line laterally and across the patellar tendon and lateral epicondyle  He is anxious to get something done about his knee  After the [employer] meeting we will plan on revision surgery primarily focusing on his knee With Intra-Op cultures and probably some antibiotic pellets   Encounter Diagnoses  Name Primary?   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

## 2021-10-29 ENCOUNTER — Telehealth: Payer: Self-pay

## 2021-10-29 DIAGNOSIS — Z96651 Presence of right artificial knee joint: Secondary | ICD-10-CM

## 2021-10-29 NOTE — Telephone Encounter (Signed)
Refill request sent to provider with the medication pended

## 2021-10-29 NOTE — Telephone Encounter (Signed)
Patient's wife called stating that they both have COVID and that is why he hasn't been in to see you.  She wanted to be sure that you were aware.

## 2021-10-29 NOTE — Telephone Encounter (Signed)
Hydrocodone-Aetaminophen 10/325 mg  Qty 30 Tablets  Take 1 tablet by mouth every 6(six) hours as needed.  PATIENT USES [facility]

## 2021-11-04 NOTE — Telephone Encounter (Signed)
No  ? ?I ll call him  ? ?No more paqtients I m going out og town  ? ?No no no

## 2021-11-04 NOTE — Telephone Encounter (Signed)
Patient and wife are following ?

## 2021-11-04 NOTE — Telephone Encounter (Signed)
Patient aware to expect a call from Dr Aline Brochure as noted.  ?

## 2021-11-04 NOTE — Telephone Encounter (Signed)
(  Continued) patient and wife called - following up on Dr Ruthe Mannan return call to them to discuss status of knee since he has been seen by the orthopaedic surgeon Dr Aline Brochure referred him to.* ?*Also states knee is swollen and hurting. Ok to bring in today (cancellation slot) - please advise. ?

## 2021-11-04 NOTE — Telephone Encounter (Signed)
Noted  

## 2021-11-05 ENCOUNTER — Other Ambulatory Visit: Payer: Self-pay

## 2021-11-05 ENCOUNTER — Ambulatory Visit (INDEPENDENT_AMBULATORY_CARE_PROVIDER_SITE_OTHER): Payer: PPO | Admitting: Orthopedic Surgery

## 2021-11-05 DIAGNOSIS — T8484XS Pain due to internal orthopedic prosthetic devices, implants and grafts, sequela: Secondary | ICD-10-CM | POA: Diagnosis not present

## 2021-11-05 DIAGNOSIS — Z96651 Presence of right artificial knee joint: Secondary | ICD-10-CM | POA: Diagnosis not present

## 2021-11-05 DIAGNOSIS — M25461 Effusion, right knee: Secondary | ICD-10-CM | POA: Diagnosis not present

## 2021-11-05 DIAGNOSIS — Z96659 Presence of unspecified artificial knee joint: Secondary | ICD-10-CM

## 2021-11-05 MED ORDER — HYDROCODONE-ACETAMINOPHEN 10-325 MG PO TABS: 1.0000 | ORAL_TABLET | Freq: Four times a day (QID) | ORAL | 0 refills | Status: DC | PRN

## 2021-11-05 NOTE — Addendum Note (Signed)
Addended by: Obie Dredge A on: 11/05/2021 11:59 AM ? ? Modules accepted: Orders ? ?

## 2021-11-05 NOTE — Progress Notes (Signed)
Chief Complaint  ?Patient presents with  ? Knee Pain  ?  RT knee/ pain and swelling, hurts to walk ?Aspiration per provider  ? ?Encounter Diagnoses  ?Name Primary?  ? S/P TKR (total knee replacement), right   ? Pain due to total knee replacement, sequela Yes  ? Effusion, right knee   ? ?Continued pain right knee seem to be activity related the more he is on it the more it swells and then it goes down ? ?I aspirated the knee again it looks like amber-colored fluid and may be even some blood as if he is having some type of synovitis ? ?His pain continues to be at the tibial area in the proximal portion of the tibia with actually very minimal pain in the un resurfaced patella ? ? ?Procedure note injection and aspiration right knee joint ? ?Verbal consent was obtained to aspirate and inject the right knee joint  ? ?Timeout was completed to confirm the site of aspiration and injection ? ?An 18-gauge needle was used to aspirate the knee joint from a suprapatellar lateral approach. ? ?The medications used were 40 mg of Depo-Medrol and 1% lidocaine 3 cc ? ?Anesthesia was provided by ethyl chloride and the skin was prepped with alcohol. ? ?After cleaning the skin with alcohol an 18-gauge needle was used to aspirate the right knee joint. ? ?We obtained 25 cc of fluid amber-colored ? ?We follow this by injection of 40 mg of Depo-Medrol and 3 cc 1% lidocaine. ? ?There were no complications. A sterile bandage was applied. ?  ? ?Meds ordered this encounter  ?Medications  ? HYDROcodone-acetaminophen (NORCO) 10-325 MG tablet  ?  Sig: Take 1 tablet by mouth every 6 (six) hours as needed.  ?  Dispense:  30 tablet  ?  Refill:  0  ? ?Culture results to patient ? ?He and I have discussed arthrotomy exploration and revision depending on the results of the culture and sensitivity and 6 cell count ? ?If everything looks noninfectious then the arthrotomy would involve polyethylene exchange with irrigation debridement check the tibia for  loosening and replace it if necessary and resurfaced patella at the same time ? ? ?

## 2021-11-05 NOTE — Addendum Note (Signed)
Addended by: Obie Dredge A on: 11/05/2021 11:58 AM ? ? Modules accepted: Orders ? ?

## 2021-11-06 ENCOUNTER — Encounter: Payer: Self-pay | Admitting: Neurology

## 2021-11-07 ENCOUNTER — Ambulatory Visit: Payer: PPO | Admitting: Neurology

## 2021-11-07 ENCOUNTER — Encounter: Payer: Self-pay | Admitting: Neurology

## 2021-11-07 VITALS — BP 124/63 | HR 45 | Ht 66.0 in | Wt 225.0 lb

## 2021-11-07 DIAGNOSIS — Z9989 Dependence on other enabling machines and devices: Secondary | ICD-10-CM | POA: Diagnosis not present

## 2021-11-07 DIAGNOSIS — G4733 Obstructive sleep apnea (adult) (pediatric): Secondary | ICD-10-CM

## 2021-11-07 NOTE — Patient Instructions (Signed)
It was nice to see you again today. I am glad you are doing well with your new machine; you are fully compliant with it. Keep up the good work!  ?I am sorry you had an injury to your right knee.  ? ?Please continue using your CPAP regularly. While your insurance requires that you use CPAP at least 4 hours each night on 70% of the nights, I recommend, that you not skip any nights and use it throughout the night if you can. Getting used to CPAP and staying with the treatment long term does take time and patience and discipline. Untreated obstructive sleep apnea when it is moderate to severe can have an adverse impact on cardiovascular health and raise her risk for heart disease, arrhythmias, hypertension, congestive heart failure, stroke and diabetes. Untreated obstructive sleep apnea causes sleep disruption, nonrestorative sleep, and sleep deprivation. This can have an impact on your day to day functioning and cause daytime sleepiness and impairment of cognitive function, memory loss, mood disturbance, and problems focussing. Using CPAP regularly can improve these symptoms. ? ?We can see you in 1 year, you can see one of our nurse practitioners as you are stable.  ?

## 2021-11-07 NOTE — Progress Notes (Signed)
Subjective:    Patient ID: Christopher [name] is a 66 y.o. male.  HPI    Interim history:   Christopher [name] is a 66 year old right-handed gentleman with an underlying medical history of COPD, smoking, arthritis, reflux disease, gout, prediabetes, hyperlipidemia, irritable bowel syndrome, anxiety, depression, and obesity, who presents for follow-up consultation of his obstructive sleep apnea after an interim sleep study and starting CPAP therapy with new equipment.  The patient is accompanied by his wife today.  I first met him at the request of his primary care physician on 04/01/2021, at which time he reported a longstanding history of sleep apnea and being on CPAP therapy but he needed new equipment and new supplies.  He was advised to proceed with a sleep study for reevaluation.  He had a split-night sleep study on 07/27/2021 which showed a baseline AHI of 48.7/h, lack of REM sleep in the baseline portion of the study and O2 nadir 89%.  He was titrated on CPAP from 5 cm to 14 cm.  On the final pressure his AHI was 1.7/h with nonsupine REM sleep achieved and O2 nadir of 90%.  Based on the test results he was advised to start CPAP therapy at home.  His set up date was 09/10/2021.  He has a Medical illustrator sense 11 autoset machine.  Today, 11/07/2021: I reviewed his CPAP compliance data from 10/18/2021 through 138 2023, which is of 30 days, during which time he used his machine every night with percent use days greater than 4 hours at 100%, indicating superb compliance with an average usage of 9 hours and 53 minutes, residual AHI at goal at 3.6/h, leak acceptable but fluctuating with the 95th percentile at 25.5 liters per minute on a pressure of 14 centimeter with EPR of 1.  He reports doing well with his new machine, he feels much better.  He uses a fullface mask and sometimes the headgear does become low so he has to tighten it again which would explain the leak fluctuation.  He is not sleeping all that well currently  because of right knee pain.  About a month ago he injured it in a torsion and while he was working and he injured his right knee.  He had really done well after his knee replacement last year but reports that he may have resumed at work too soon.  He is in discomfort, he does use hydrocodone as needed.  He no longer takes oxycodone.  He is not on prednisone at this time and for the past week or so he has not been on temazepam any longer.  Overall, his CPAP is working really well.  He is fully compliant with it, he has no problem using it, the only thing that is not is the humidity, he is humidifier does not last all night and he has to refill the water chamber sometimes in the middle of the night.  He is very motivated to continue with treatment.  He may need surgery to the right knee because of his recent injury to it.  His DME company is [employer].  He goes to bed generally between 9 and 10 PM and rise time is around 8 AM.  He drinks quite a bit of caffeine, including a cup of coffee in the morning and 4 to 5 glasses of sweet tea during the day.  His nocturia is better since he started CPAP therapy, it is down to 1 or 2 times per night where it was  3-4 times per night previously.  The patient's allergies, current medications, family history, past medical history, past social history, past surgical history and problem list were reviewed and updated as appropriate.   Previously:   04/01/21: (He) was previously diagnosed with obstructive sleep apnea and placed on CPAP therapy.  He has not been using his CPAP machine due to lack of supplies.  His sleep testing was approximately 12 to 15 years ago and prior sleep study results are not available for my review today.  I reviewed your office note from 02/18/2021.  His Epworth sleepiness score is 9 out of 24, fatigue severity score is 62 out of 63.  He has not used his CPAP machine in about 1 month or longer.  He does not sleep well without it.  He has an old  machine and did not bring it today so compliance data is not available.  He reports that his testing was at [facility] some 15 years ago.  As far as the severity of his sleep apnea, his wife recalls that he stopped breathing about 70 times per hour.  He believes his pressure has been at 20.  He uses a fullface mask.  Of note, he takes chronic narcotic pain medication for his knee pain.  He is supposed to have knee replacement surgery on the right, does not have an appointment set up yet.  He had a bone fragment that needed to heal first, he is wearing a brace. He smokes about a pack per day.  He has not been able to quit, had a reaction to Chantix per wife.  He does not drink any alcohol.  He drinks caffeine in the form of coffee, 2 cups in the morning, several servings of soda and tea per day, hardly any water by self-report.  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  His snoring is loud and disturbing to his wife.  He lives with his wife, they have a small dog in the household.  He has a grown son.  She has noticed apneic pauses while he is asleep and loud gasping sounds.   His Past Medical History Is Significant For: Past Medical History:  Diagnosis Date   Anxiety    Arthritis    Asthma    BPH (benign prostatic hyperplasia)    Complication of anesthesia    pt had a hard time being able to move after spinal anesthesia , 3-4 hours   Depression    GERD (gastroesophageal reflux disease)    Gout    no meds   Headache(784.0)    otc meds prn   Heart murmur    dx as a child, no problems as an adult   Hyperlipidemia    IBS (irritable bowel syndrome)    PONV (postoperative nausea and vomiting)    Pre-diabetes    Borderline, diet and exercise, no med   Sleep apnea    uses CIPAP machine at night   Wears partial dentures    bottom partial    His Past Surgical History Is Significant For: Past Surgical History:  Procedure Laterality Date    APPENDECTOMY     BACK SURGERY  2002   neck and back fusion   BIOPSY  12/27/2015   Procedure: BIOPSY;  Surgeon: Rogene Houston, MD;  [facility];  Service: Endoscopy;;  Fundus biopsies and duodenal biopsies   BIOPSY  02/10/2020   Procedure: BIOPSY;  Surgeon: Rogene Houston, MD;  [facility];  Service: Endoscopy;;  antral   CARDIAC CATHETERIZATION     CARDIAC CATHETERIZATION N/A 09/04/2016   Procedure: Right/Left Heart Cath and Coronary Angiography;  Surgeon: Peter M Martinique, MD;  Location: [facility] [facility];  Service: Cardiovascular;  Laterality: N/A;   CHOLECYSTECTOMY     CHONDROPLASTY  08/15/2011   Procedure: CHONDROPLASTY;  Surgeon: [name], MD;  [facility]: [facility];  Service: Orthopedics;  Laterality: Left;   COLONOSCOPY  06/27/2011   Procedure: COLONOSCOPY;  Surgeon: Rogene Houston, MD;  [facility];  Service: Endoscopy;  Laterality: N/A;  9:00 / Pt to be here at 9am for 10:45 procedure, benign polyps removed   COLONOSCOPY N/A 10/05/2014   Procedure: COLONOSCOPY;  Surgeon: Rogene Houston, MD;  [facility];  Service: Endoscopy;  Laterality: N/A;  930   COLONOSCOPY N/A 02/11/2018   Procedure: COLONOSCOPY;  Surgeon: Rogene Houston, MD;  [facility];  Service: Endoscopy;  Laterality: N/A;  830   ESOPHAGOGASTRODUODENOSCOPY N/A 12/27/2015   Procedure: ESOPHAGOGASTRODUODENOSCOPY (EGD);  Surgeon: Rogene Houston, MD;  [facility];  Service: Endoscopy;  Laterality: N/A;  3:00   ESOPHAGOGASTRODUODENOSCOPY (EGD) WITH PROPOFOL N/A 02/10/2020   Procedure: ESOPHAGOGASTRODUODENOSCOPY (EGD) WITH PROPOFOL;  Surgeon: Rogene Houston, MD;  [facility];  Service: Endoscopy;  Laterality: N/A;  155   HERNIA REPAIR     umbilical hernia   JOINT REPLACEMENT     knee and shoulder   KNEE ARTHROSCOPY     left knee   KNEE ARTHROSCOPY     right knee    LUMBAR LAMINECTOMY/DECOMPRESSION MICRODISCECTOMY  09/14/2012    Procedure: LUMBAR LAMINECTOMY/DECOMPRESSION MICRODISCECTOMY 1 LEVEL;  Surgeon: Floyce Stakes, MD;  Location: [place];  Service: Neurosurgery;  Laterality: Right;  Right Lumbar three-four Diskectomy   neck fusion  2005   POLYPECTOMY  02/11/2018   Procedure: POLYPECTOMY;  Surgeon: Rogene Houston, MD;  [facility];  Service: Endoscopy;;  colon   REVISION TOTAL SHOULDER TO REVERSE TOTAL SHOULDER Left 12/16/2018   Procedure: REVISION TOTAL SHOULDER TO REVERSE TOTAL SHOULDER;  Surgeon: Meredith Pel, MD;  Location: [place];  Service: Orthopedics;  Laterality: Left;   SHOULDER ARTHROSCOPY WITH BICEPSTENOTOMY Left 09/23/2013   Procedure: SHOULDER ARTHROSCOPY WITH BICEPSTENOTOMY AND EXTENSIVE DEBRIDEMENT;  Surgeon: Carole Civil, MD;  [facility];  Service: Orthopedics;  Laterality: Left;   SHOULDER ARTHROSCOPY WITH ROTATOR CUFF REPAIR Left 05/05/2014   Procedure: SHOULDER ARTHROSCOPY LIMITED DEBRIDEMENT;  Surgeon: Carole Civil, MD;  [facility];  Service: Orthopedics;  Laterality: Left;   SHOULDER OPEN ROTATOR CUFF REPAIR Left 05/05/2014   Procedure: ROTATOR CUFF REPAIR SHOULDER OPEN;  Surgeon: Carole Civil, MD;  [facility];  Service: Orthopedics;  Laterality: Left;   SHOULDER OPEN ROTATOR CUFF REPAIR Left 12/16/2018   Procedure: LEFT SHOULDER POSSIBLE SUBSCAPULARIS REPAIR VS. REVISION TO REVERSE TOTAL SHOULDER REPLACEMENT;  Surgeon: Meredith Pel, MD;  Location: [place];  Service: Orthopedics;  Laterality: Left;   SHOULDER SURGERY Right    Open Mumford procedure   SPINAL FUSION     x2, 2003 and 2007   TOTAL KNEE ARTHROPLASTY Left 06/16/2017   Procedure: LEFT TOTAL KNEE ARTHROPLASTY;  Surgeon: Carole Civil, MD;  [facility];  Service: Orthopedics;  Laterality: Left;   TOTAL KNEE ARTHROPLASTY Right 05/14/2021   Procedure: TOTAL KNEE ARTHROPLASTY;  Surgeon: Aline Brochure,  Tim Lair, MD;  Location: [facility];  Service: Orthopedics;   Laterality: Right;   TOTAL SHOULDER ARTHROPLASTY Left 08/19/2018   Procedure: left shoulder replacement;  Surgeon: Meredith Pel, MD;  Location: [place];  Service: Orthopedics;  Laterality: Left;    His Family History Is Significant For: Family History  Problem Relation Age of Onset   Alzheimer's disease Father    Diabetes Father    Diabetes Other    Lung disease Other    Arthritis Other    Anesthesia problems Neg Hx    Hypotension Neg Hx    Malignant hyperthermia Neg Hx    Pseudochol deficiency Neg Hx    Sleep apnea Neg Hx     His Social History Is Significant For: Social History   Socioeconomic History   Marital status: Married    Spouse name: Not on file   Number of children: Not on file   Years of education: Not on file   Highest education level: Not on file  Occupational History   Occupation: Buyer, retail    Employer: DISABLED  Tobacco Use   Smoking status: Every Day    Packs/day: 1.00    Years: 44.00    Pack years: 44.00    Types: Cigarettes   Smokeless tobacco: Never   Tobacco comments:    smokes a pack a day. since age 89  Vaping Use   Vaping Use: Never used  Substance and Sexual Activity   Alcohol use: No    Alcohol/week: 0.0 standard drinks   Drug use: No   Sexual activity: Not Currently  Other Topics Concern   Not on file  Social History Narrative   Lives at home with wife   Right handed   Caffeine: 2 cups of coffee, 2 cans of soda, 4-5 cups of tea daily.   Social Determinants of Health   Financial Resource Strain: Not on file  Food Insecurity: Not on file  Transportation Needs: Not on file  Physical Activity: Not on file  Stress: Not on file  Social Connections: Not on file    His Allergies Are:  Allergies  Allergen Reactions   Celebrex [Celecoxib] Itching, Swelling and Other (See Comments)    All over   Cortisone Swelling    SWELLING REACTION UNSPECIFIED    Doxycycline Swelling and Other (See Comments)    Made tongue turn  black    Prednisone Swelling    SWELLING REACTION UNSPECIFIED    Relafen [Nabumetone] Swelling    SWELLING REACTION UNSPECIFIED    Codeine Nausea And Vomiting and Other (See Comments)    Extreme stomach pain. This includes anything with the derivative of codeine in it.   Aspirin Itching   Oxycodone-Acetaminophen Itching and Other (See Comments)    Can not  tolerate with benadryl   :   His Current Medications Are:  Outpatient Encounter Medications as of 11/07/2021  Medication Sig   benzonatate (TESSALON) 100 MG capsule Take 100 mg by mouth 3 (three) times daily.   EPINEPHrine 0.3 mg/0.3 mL IJ SOAJ injection Inject 0.3 mg into the muscle as needed for anaphylaxis.   HYDROcodone-acetaminophen (NORCO) 10-325 MG tablet Take 1 tablet by mouth every 6 (six) hours as needed.   Menthol, Topical Analgesic, (ICY HOT EX) Apply 1 application topically daily as needed (pain).   methocarbamol (ROBAXIN) 500 MG tablet Take 1 tablet (500 mg total) by mouth every 6 (six) hours as needed for muscle spasms.   methylPREDNISolone (MEDROL) 4 MG tablet  Take 6,5,4,3,2,1 over 6 days : 3 bid day 1, 3 am and 2 pm day 2, 2 am and 2 pm day 3, 2 am 1 pm day 4, 1 am 1 pm day 5 and 1 day 6   nitroGLYCERIN (NITROSTAT) 0.4 MG SL tablet PLACE (1) TABLET UNDER TONGUE EVERY 5 MINUTES UP TO (3) DOSES. IF NO RELIEF CALL 911.   pantoprazole (PROTONIX) 40 MG tablet TAKE (1) TABLET TWICE A DAY BEFORE MEALS. (Patient taking differently: Take 40 mg by mouth in the morning.)   pregabalin (LYRICA) 50 MG capsule Take 1 capsule (50 mg total) by mouth 2 (two) times daily.   PROAIR HFA 108 (90 Base) MCG/ACT inhaler Inhale 2 puffs into the lungs every 6 (six) hours as needed for wheezing or shortness of breath.    rosuvastatin (CRESTOR) 10 MG tablet Take 10 mg by mouth daily.   sertraline (ZOLOFT) 50 MG tablet Take 50 mg by mouth in the morning.   aspirin EC 325 MG EC tablet Take 1 tablet (325 mg total) by mouth daily with breakfast.    diclofenac (VOLTAREN) 75 MG EC tablet Take 75 mg by mouth 2 (two) times daily.   docusate sodium (COLACE) 100 MG capsule Take 1 capsule (100 mg total) by mouth 2 (two) times daily.   ibuprofen (ADVIL) 800 MG tablet Take 1 tablet (800 mg total) by mouth 4 (four) times daily.   oxyCODONE (OXYCONTIN) 10 mg 12 hr tablet Take 1 tablet (10 mg total) by mouth every 12 (twelve) hours.   temazepam (RESTORIL) 7.5 MG capsule Take 7.5 mg by mouth at bedtime. (Patient not taking: Reported on 11/07/2021)   No facility-administered encounter medications on file as of 11/07/2021.  :  Review of Systems:  Out of a complete 14 point review of systems, all are reviewed and negative with the exception of these symptoms as listed below:  Review of Systems  Neurological:        Pt is here CPAP follow up  Pt states he has no questions or concerns for today's visit . Pt states he sleeps better    Objective:  Neurological Exam  Physical Exam Physical Examination:   Vitals:   11/07/21 1306  BP: 124/63  Pulse: (!) 45   General Examination: The patient is a very pleasant 66 y.o. male in no acute distress. He appears well-developed and well-nourished and well groomed.   HEENT: Normocephalic, atraumatic, pupils are equal, round and reactive to light, extraocular tracking is well-preserved, face is symmetric with normal facial animation, hearing grossly intact.  Speech is clear without dysarthria but speech is edentulous.  Neck is supple, no carotid bruits, full range of motion noted.  Airway examination reveals mild mouth dryness, he is edentulous, tongue protrudes centrally and palate elevates symmetrically, moderate airway crowding.     Chest: Clear to auscultation without wheezing, rhonchi or crackles noted.   Heart: S1+S2+0, regular and normal without murmurs, rubs or gallops noted.    Abdomen: Soft, non-tender and non-distended.   Extremities: There is no pitting edema in the distal lower extremities  bilaterally.    Skin: Warm and dry without trophic changes noted.    Musculoskeletal: exam reveals right knee swelling, unremarkable left knee scar, unremarkable right knee scar.   Neurologically:  Mental status: The patient is awake, alert and oriented in all 4 spheres. His immediate and remote memory, attention, language skills and fund of knowledge are appropriate. There is no evidence of aphasia, agnosia, apraxia or anomia.  Speech is clear with normal prosody and enunciation. Thought process is linear. Mood is normal and affect is normal.  Cranial nerves II - XII are as described above under HEENT exam.  Motor exam: Normal bulk, strength and tone is noted. There is no tremor, fine motor skills and coordination: grossly intact.  Cerebellar testing: No dysmetria or intention tremor. There is no truncal or gait ataxia.  Sensory exam: intact to light touch in the upper and lower extremities.  Gait, station and balance: He stands slowly and walks slowly, no obvious limp, no walking aid.     Assessment and Plan:  In summary, TAVITA EASTHAM is a very pleasant 66 year old male with an underlying medical history of COPD, smoking, arthritis, reflux disease, gout, prediabetes, hyperlipidemia, irritable bowel syndrome, anxiety, depression, and obesity, who presents for follow-up consultation of his obstructive sleep apnea.  He had a split-night sleep study for reevaluation in October 2022 and was found to have severe obstructive sleep apnea, he had absence of REM sleep during his baseline portion of the study and did well with CPAP of 14 cm.  He established treatment with a new CPAP machine since January 2023 and is compliant with treatment.  His apnea score is at goal, leak fluctuates and is on the higher side but he does have some restlessness and difficulty sleeping lately, in part due to his knee pain.  He may need knee surgery to the right knee after an injury. He is also no longer on temazepam since  the beginning of this month.  He has benefited from CPAP therapy.  Motivated to continue with treatment.  He is commended for his treatment adherence and advised to follow-up routinely to see one of our nurse practitioners in 1 year in sleep clinic.  I answered all their questions today and the patient and his wife were in agreement.   I spent 30 minutes in total face-to-face time and in reviewing records during pre-charting, more than 50% of which was spent in counseling and coordination of care, reviewing test results, reviewing medications and treatment regimen and/or in discussing or reviewing the diagnosis of OSA, the prognosis and treatment options. Pertinent laboratory and imaging test results that were available during this visit with the patient were reviewed by me and considered in my medical decision making (see chart for details).

## 2021-11-10 LAB — SYNOVIAL FLUID ANALYSIS, COMPLETE
Basophils, %: 0 %
Eosinophils-Synovial: 4 % — ABNORMAL HIGH (ref 0–2)
Lymphocytes-Synovial Fld: 9 % (ref 0–74)
Monocyte/Macrophage: 21 % (ref 0–69)
Neutrophil, Synovial: 66 % — ABNORMAL HIGH (ref 0–24)
Synoviocytes, %: 0 % (ref 0–15)
WBC, Synovial: 8556 cells/uL — ABNORMAL HIGH (ref <150)

## 2021-11-10 LAB — WOUND CULTURE
MICRO NUMBER:: 13098273
RESULT:: NO GROWTH
SPECIMEN QUALITY:: ADEQUATE

## 2021-11-18 ENCOUNTER — Telehealth: Payer: Self-pay | Admitting: Orthopedic Surgery

## 2021-11-18 NOTE — Telephone Encounter (Signed)
Patient/wife Christopher [name], designated contact, on file called  to check on results - said patient is awaiting results of fluid test (status in chart 'not released') ?Patient is wanting to know what next step may be. States best ph# [phone]  ?

## 2021-11-18 NOTE — Telephone Encounter (Signed)
You can call them and let them know that Dr. Aline Brochure is still determining what the next best step is and he will receive a phone call from me within the next 48 hours

## 2021-11-18 NOTE — Telephone Encounter (Signed)
Please advise 

## 2021-11-19 NOTE — Telephone Encounter (Signed)
Spoke with spouse and advised her that Dr. Aline Brochure will call within the next 48 hours to discuss the next steps. She is in agreement with this plan.  ?

## 2021-11-22 ENCOUNTER — Telehealth: Payer: Self-pay | Admitting: Orthopedic Surgery

## 2021-11-22 NOTE — Telephone Encounter (Signed)
Spoke with Mr. Cantera ? ?His last cell count was 8000 with a 64% neutrophil count and no growth ? ?Unsure what to do at this at this time and I let him know that ? ?I would like him to see a revision specialist to specializes in infectious with total knees and see what they say he is comfortable with that we will try to set that up next week ?

## 2021-12-04 ENCOUNTER — Telehealth: Payer: Self-pay

## 2021-12-04 NOTE — Telephone Encounter (Signed)
Call her back  ? ?Tell her I cant get in touch with the doctor I want him to see  ? ?I will amje a referral to [facility]  ? ?(So go ahead and make the referral for painful total knee )

## 2021-12-04 NOTE — Telephone Encounter (Signed)
Patient's wife called wanting to find out why Dr. Aline Brochure hasn't called him yet. Stated he was suppose to called him last week. ?Stated that patient's knee is now staying swollen and he is having a hard time walking on it. She wants to know what Dr. Aline Brochure ?is planning on doing, because he can't keep going like this. ? ?Please call and advise  [phone] ?

## 2021-12-05 ENCOUNTER — Other Ambulatory Visit: Payer: Self-pay

## 2021-12-05 ENCOUNTER — Other Ambulatory Visit: Payer: Self-pay | Admitting: Radiology

## 2021-12-05 DIAGNOSIS — G8929 Other chronic pain: Secondary | ICD-10-CM

## 2021-12-05 DIAGNOSIS — Z96651 Presence of right artificial knee joint: Secondary | ICD-10-CM

## 2021-12-05 DIAGNOSIS — Z96659 Presence of unspecified artificial knee joint: Secondary | ICD-10-CM

## 2021-12-05 MED ORDER — HYDROCODONE-ACETAMINOPHEN 10-325 MG PO TABS: 1.0000 | ORAL_TABLET | Freq: Four times a day (QID) | ORAL | 0 refills | Status: DC | PRN

## 2021-12-05 NOTE — Telephone Encounter (Signed)
Received a faxed request for refill hydrocodone from pharmacy.   ?

## 2021-12-05 NOTE — Telephone Encounter (Signed)
Patient referral faxed to [place] @ [phone]. Spouse is aware and in agreement plan ?

## 2021-12-23 ENCOUNTER — Ambulatory Visit: Payer: PPO | Admitting: Surgical

## 2022-01-01 ENCOUNTER — Ambulatory Visit: Payer: PPO | Admitting: Orthopedic Surgery

## 2022-01-01 ENCOUNTER — Ambulatory Visit (INDEPENDENT_AMBULATORY_CARE_PROVIDER_SITE_OTHER): Payer: PPO

## 2022-01-01 VITALS — Ht 66.0 in | Wt 225.0 lb

## 2022-01-01 DIAGNOSIS — M25461 Effusion, right knee: Secondary | ICD-10-CM

## 2022-01-02 LAB — CBC WITH DIFFERENTIAL/PLATELET
Absolute Monocytes: 520 cells/uL (ref 200–950)
Basophils Absolute: 40 cells/uL (ref 0–200)
Basophils Relative: 0.5 %
Eosinophils Absolute: 8 cells/uL — ABNORMAL LOW (ref 15–500)
Eosinophils Relative: 0.1 %
HCT: 43.7 % (ref 38.5–50.0)
Hemoglobin: 14.8 g/dL (ref 13.2–17.1)
Lymphs Abs: 2400 cells/uL (ref 850–3900)
MCH: 28 pg (ref 27.0–33.0)
MCHC: 33.9 g/dL (ref 32.0–36.0)
MCV: 82.8 fL (ref 80.0–100.0)
MPV: 10.3 fL (ref 7.5–12.5)
Monocytes Relative: 6.5 %
Neutro Abs: 5032 cells/uL (ref 1500–7800)
Neutrophils Relative %: 62.9 %
Platelets: 252 10*3/uL (ref 140–400)
RBC: 5.28 10*6/uL (ref 4.20–5.80)
RDW: 14.5 % (ref 11.0–15.0)
Total Lymphocyte: 30 %
WBC: 8 10*3/uL (ref 3.8–10.8)

## 2022-01-02 LAB — C-REACTIVE PROTEIN: CRP: 11.6 mg/L — ABNORMAL HIGH (ref <8.0)

## 2022-01-02 LAB — SEDIMENTATION RATE: Sed Rate: 6 mm/h (ref 0–20)

## 2022-01-02 NOTE — Progress Notes (Signed)
Hi Lauren.  Labs look good on [place].  Can you ask Jackelyn Poling just to go ahead and post him for some time whenever he wants to get done.  Thanks she has the blue sheet..  Also can you call him and let him know.  Thanks

## 2022-01-06 NOTE — Progress Notes (Signed)
? ?Office Visit Note ?  ?Patient: Christopher [name]           ?Date of Birth: 03/13/1956           ?MRN: [id number] ?Visit Date: 01/01/2022 ?Requested by: Leeanne Rio, MD ?[address] ?[address] ?[place],  [place] [address] ?PCP: Leeanne Rio, MD ? ?Subjective: ?Chief Complaint  ?Patient presents with  ? Right Knee - Pain  ? ? ?HPI: Christopher [name] is a 66 year old patient here for third opinion regarding his right knee.  Patient had left total knee replacement several years ago and did very well with that.  Had right total knee replacement in September 2022 but has had primarily anterior knee pain since that time.  The right total knee replacement did not undergo patella resurfacing.  Left total knee replacement did.  He has been seen by Dr. Mayer Camel for a second opinion.  His recommendation was injection of the prosthetic knee with cortisone along with resurfacing of the patella.  Patient denies any fevers or chills.  He is doing well with his shoulder surgery.  Taking Norco 1/day.  No personal or family history of DVT or pulmonary embolism. ?             ?ROS: All systems reviewed are negative as they relate to the chief complaint within the history of present illness.  Patient denies  fevers or chills. ? ? ?Assessment & Plan: ?Visit Diagnoses:  ?1. Effusion, right knee   ? ? ?Plan: Impression is right knee pain and effusion.  I think that the patella is the likely culprit in terms of pain generation in this knee.  Does not appear to be referred pain from the back or hip.  Patient has had very good result with his left total knee replacement which did have resurfaced patella.  Aspiration of the knee performed earlier this year did give him about 30 to 40% relief.  Plan at this time is to draw CBC DIF sed rate C-reactive protein which were normal at the time of this dictation.  The risk and benefits of surgery are discussed with Christopher [name] including not limited to knee stiffness infection as well as incomplete pain relief.  The  rehabilitation should be slightly easier than with total knee replacement.  Patient understands the risk and benefits and would like to proceed.  All questions answered ? ?Follow-Up Instructions: No follow-ups on file.  ? ?Orders:  ?Orders Placed This Encounter  ?Procedures  ? XR KNEE 3 VIEW RIGHT  ? CBC with Differential  ? Sed Rate (ESR)  ? C-reactive protein  ? ?No orders of the defined types were placed in this encounter. ? ? ? ? Procedures: ?No procedures performed ? ? ?Clinical Data: ?No additional findings. ? ?Objective: ?Vital Signs: Ht _0  (1.676 m)   Wt 225 lb (102.1 kg)   BMI 36.32 kg/m?  ? ?Physical Exam:  ? ?Constitutional: Patient appears well-developed ?HEENT:  ?Head: Normocephalic ?Eyes:EOM are normal ?Neck: Normal range of motion ?Cardiovascular: Normal rate ?Pulmonary/chest: Effort normal ?Neurologic: Patient is alert ?Skin: Skin is warm ?Psychiatric: Patient has normal mood and affect ? ? ?Ortho Exam: Ortho exam demonstrates intact extensor mechanism on the right.  Mild effusion is present but the knee itself is not warm.  No nerve root tension signs on the right and no groin pain with internal/external rotation of the leg.  Pedal pulses palpable.  Ankle dorsiflexion intact.  Negative apprehension on the patella which tracked very nicely within  the groove on the right-hand side.  No areas tender to palpation along the femoral or tibial component region.  No proximal lymphadenopathy. ? ?Specialty Comments:  ?No specialty comments available. ? ?Imaging: ?No results found. ? ? ?PMFS History: ?Patient Active Problem List  ? Diagnosis Date Noted  ? S/P TKR (total knee replacement), right 05/14/21 07/02/2021  ? Status post total right knee replacement 05/14/2021  ? Primary osteoarthritis of right knee   ? Renal cyst 10/03/2020  ? Frequency of micturition 10/03/2020  ? Benign prostatic hyperplasia with urinary obstruction 10/03/2020  ? IBS (irritable bowel syndrome) 07/05/2020  ? Lumbar disc  herniation 06/29/2019  ? 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 Primary osteoarthritis, left shoulder   ? 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  ? History of colonic polyps 11/26/2017  ? S/P total knee replacement, left 06/16/17 06/16/2017  ? Primary osteoarthritis of left knee   ? Hyperlipidemia 09/04/2016  ? Angina pectoris ([place]) 09/04/2016  ? OSA (obstructive sleep apnea) 09/04/2016  ? COPD (chronic obstructive pulmonary disease) ([place]) 09/04/2016  ? Rotator cuff tear 05/05/2014  ? S/P shoulder surgery 09/26/2013  ? Arthritis, shoulder region 09/26/2013  ? Bursitis, shoulder 09/26/2013  ? Synovitis of shoulder 09/26/2013  ? Labral tear of shoulder, degenerative 09/26/2013  ? Biceps tendon tear 09/26/2013  ? Rotator cuff syndrome of left shoulder 06/21/2013  ? Arthritis 06/21/2013  ? Patellar tendinitis 03/29/2013  ? Effusion of knee joint 03/29/2013  ? Bursitis/tendonitis, shoulder 03/10/2013  ? Effusion of knee joint, left 08/18/2011  ? Knee pain 08/18/2011  ? Acute torn meniscus 07/30/2011  ? Old torn meniscus of knee 07/30/2011  ? ARTHRITIS, LEFT KNEE 10/15/2010  ? MEDIAL MENISCUS TEAR, RIGHT 10/15/2010  ? HIP PAIN 01/15/2010  ? DEGENERATIVE DISC DISEASE, LUMBOSACRAL SPINE W/RADICULOPATHY 01/15/2010  ? PLICA SYNDROME 06/17/5101  ? DERANGEMENT MENISCUS 07/09/2009  ? JOINT EFFUSION, LEFT KNEE 07/09/2009  ? Unilateral primary osteoarthritis, left knee 01/30/2009  ? KNEE PAIN 01/30/2009  ? ANKLE SPRAIN, RIGHT 11/08/2007  ? ?Past Medical History:  ?Diagnosis Date  ? Anxiety   ? Arthritis   ? Asthma   ? BPH (benign prostatic hyperplasia)   ? Complication of anesthesia   ? pt had a hard time being able to move after spinal anesthesia , 3-4 hours  ? Depression   ? GERD (gastroesophageal reflux disease)   ? Gout   ? no meds  ? Headache(784.0)   ? otc meds prn  ? Heart murmur   ? dx as a child, no problems as an adult  ? Hyperlipidemia   ? IBS (irritable bowel syndrome)   ? PONV (postoperative nausea and vomiting)    ? Pre-diabetes   ? Borderline, diet and exercise, no med  ? Sleep apnea   ? uses CIPAP machine at night  ? Wears partial dentures   ? bottom partial  ?  ?Family History  ?Problem Relation Age of Onset  ? Alzheimer's disease Father   ? Diabetes Father   ? Diabetes Other   ? Lung disease Other   ? Arthritis Other   ? Anesthesia problems Neg Hx   ? Hypotension Neg Hx   ? Malignant hyperthermia Neg Hx   ? Pseudochol deficiency Neg Hx   ? Sleep apnea Neg Hx   ?  ?Past Surgical History:  ?Procedure Laterality Date  ? APPENDECTOMY    ? BACK SURGERY  2002  ? neck and back fusion  ? BIOPSY  12/27/2015  ? Procedure: BIOPSY;  Surgeon: Rogene Houston, MD;  [facility];  Service: Endoscopy;;  Fundus biopsies and duodenal biopsies  ? BIOPSY  02/10/2020  ? Procedure: BIOPSY;  Surgeon: Rogene Houston, MD;  [facility];  Service: Endoscopy;;  antral  ? CARDIAC CATHETERIZATION    ? CARDIAC CATHETERIZATION N/A 09/04/2016  ? Procedure: Right/Left Heart Cath and Coronary Angiography;  Surgeon: Peter M Martinique, MD;  Location: [facility];  Service: Cardiovascular;  Laterality: N/A;  ? CHOLECYSTECTOMY    ? CHONDROPLASTY  08/15/2011  ? Procedure: CHONDROPLASTY;  Surgeon: [name], MD;  Location: [facility];  Service: Orthopedics;  Laterality: Left;  ? COLONOSCOPY  06/27/2011  ? Procedure: COLONOSCOPY;  Surgeon: Rogene Houston, MD;  [facility];  Service: Endoscopy;  Laterality: N/A;  9:00 / Pt to be here at 9am for 10:45 procedure, benign polyps removed  ? COLONOSCOPY N/A 10/05/2014  ? Procedure: COLONOSCOPY;  Surgeon: Rogene Houston, MD;  [facility];  Service: Endoscopy;  Laterality: N/A;  930  ? COLONOSCOPY N/A 02/11/2018  ? Procedure: COLONOSCOPY;  Surgeon: Rogene Houston, MD;  [facility];  Service: Endoscopy;  Laterality: N/A;  830  ? ESOPHAGOGASTRODUODENOSCOPY N/A 12/27/2015  ? Procedure: ESOPHAGOGASTRODUODENOSCOPY (EGD);  Surgeon: Rogene Houston, MD;   [facility];  Service: Endoscopy;  Laterality: N/A;  3:00  ? ESOPHAGOGASTRODUODENOSCOPY (EGD) WITH PROPOFOL N/A 02/10/2020  ? Procedure: ESOPHAGOGASTRODUODENOSCOPY (EGD) WITH PROPOFOL;  Surgeon: Chryl Heck

## 2022-01-09 ENCOUNTER — Other Ambulatory Visit: Payer: Self-pay

## 2022-01-09 NOTE — Progress Notes (Signed)
Prior

## 2022-01-16 ENCOUNTER — Telehealth: Payer: Self-pay | Admitting: Orthopedic Surgery

## 2022-01-16 NOTE — Telephone Encounter (Signed)
Patient's wife Zelphia Cairo calling regarding the surgery for right knee patella replacement.  She states the patients wants to know if you will take pictures (before and after) when you get into the knee surgery.  She says they would like someone to explain the surgery in detail as they are not fully aware of what is being done, but trust the doctor.  Patient has questions about the anesthesia. He seems to recall there was a problem with Spinal the last time.  As for medication after the surgery, he would like whatever was given to him when he had the shoulder done. He is highly allergic to many medications but was able to take Hydrocodone as long as he took with benadryl and Pepcid.

## 2022-01-18 NOTE — Telephone Encounter (Signed)
I called and left message on machine.  Talked about patella replacement as well as anesthesia and postop pain medicine.  Left that message on [name]'s cell phone.

## 2022-01-20 NOTE — Telephone Encounter (Signed)
noted 

## 2022-02-03 ENCOUNTER — Telehealth: Payer: Self-pay | Admitting: Orthopedic Surgery

## 2022-02-03 NOTE — Telephone Encounter (Signed)
Patient's wife called. Patient would like a refill on pain medication. Hydrocodone. His call back number is [phone] [facility] pharmacy

## 2022-02-10 NOTE — Pre-Procedure Instructions (Signed)
Surgical Instructions    Your procedure is scheduled on Tuesday 02/18/22.   Report to [facility] Main Entrance "A" at 10:10 A.M., then check in with the Admitting office.  Call this number if you have problems the morning of surgery:  [phone]   If you have any questions prior to your surgery date call [phone]: Open Monday-Friday 8am-4pm    Remember:  Do not eat after midnight the night before your surgery  You may drink clear liquids until 09:10 A.M. the morning of your surgery.   Clear liquids allowed are: Water, Non-Citrus Juices (without pulp), Carbonated Beverages, Clear Tea, Black Coffee ONLY (NO MILK, CREAM OR POWDERED CREAMER of any kind), and Gatorade   Patient Instructions  The night before surgery:  No food after midnight. ONLY clear liquids after midnight  The day of surgery (if you do NOT have diabetes):  Drink ONE (1) Pre-Surgery Clear Ensure by 09:10 A.M. the morning of surgery. Drink in one sitting. Do not sip.  This drink was given to you during your hospital  pre-op appointment visit.  Nothing else to drink after completing the  Pre-Surgery Clear Ensure.         If you have questions, please contact your surgeon's office.   Take these medicines the morning of surgery with A SIP OF WATER:   pantoprazole (PROTONIX)  pregabalin (LYRICA)   rosuvastatin (CRESTOR)  sertraline (ZOLOFT)   Take these medicines if needed:   acetaminophen (TYLENOL)   HYDROcodone-acetaminophen (NORCO)  methocarbamol (ROBAXIN)   nitroGLYCERIN (NITROSTAT)  PROAIR HFA 108 (90 Base)    As of today, STOP taking any Aspirin (unless otherwise instructed by your surgeon) Aleve, Naproxen, Ibuprofen, Motrin, Advil, Goody's, BC's, all herbal medications, fish oil, and all vitamins.           Do not wear jewelry or makeup Do not wear lotions, powders, perfumes/colognes, or deodorant. Do not shave 48 hours prior to surgery.  Men may shave face and neck. Do not bring valuables to  the hospital. Do not wear nail polish, gel polish, artificial nails, or any other type of covering on natural nails (fingers and toes) If you have artificial nails or gel coating that need to be removed by a nail salon, please have this removed prior to surgery. Artificial nails or gel coating may interfere with anesthesia's ability to adequately monitor your vital signs.  [facility] is not responsible for any belongings or valuables. .   Do NOT Smoke (Tobacco/Vaping)  24 hours prior to your procedure  If you use a CPAP at night, you may bring your mask for your overnight stay.   Contacts, glasses, hearing aids, dentures or partials may not be worn into surgery, please bring cases for these belongings   For patients admitted to the hospital, discharge time will be determined by your treatment team.   Patients discharged the day of surgery will not be allowed to drive home, and someone needs to stay with them for 24 hours.   SURGICAL WAITING ROOM VISITATION Patients having surgery or a procedure in a hospital may have two support people. Children under the age of 32 must have an adult with them who is not the patient. They may stay in the waiting area during the procedure and may switch out with other visitors. If the patient needs to stay at the hospital during part of their recovery, the visitor guidelines for inpatient rooms apply.  Please refer to the [facility] website for the visitor guidelines  for Inpatients (after your surgery is over and you are in a regular room).       Special instructions:    Oral Hygiene is also important to reduce your risk of infection.  Remember - BRUSH YOUR TEETH THE MORNING OF SURGERY WITH YOUR REGULAR TOOTHPASTE   [place]- Preparing For Surgery  Before surgery, you can play an important role. Because skin is not sterile, your skin needs to be as free of germs as possible. You can reduce the number of germs on your skin by washing with CHG  (chlorahexidine gluconate) Soap before surgery.  CHG is an antiseptic cleaner which kills germs and bonds with the skin to continue killing germs even after washing.     Please do not use if you have an allergy to CHG or antibacterial soaps. If your skin becomes reddened/irritated stop using the CHG.  Do not shave (including legs and underarms) for at least 48 hours prior to first CHG shower. It is OK to shave your face.  Please follow these instructions carefully.     Shower the NIGHT BEFORE SURGERY and the MORNING OF SURGERY with CHG Soap.   If you chose to wash your hair, wash your hair first as usual with your normal shampoo. After you shampoo, rinse your hair and body thoroughly to remove the shampoo.  Then [employer] and genitals (private parts) with your normal soap and rinse thoroughly to remove soap.  After that Use CHG Soap as you would any other liquid soap. You can apply CHG directly to the skin and wash gently with a scrungie or a clean washcloth.   Apply the CHG Soap to your body ONLY FROM THE NECK DOWN.  Do not use on open wounds or open sores. Avoid contact with your eyes, ears, mouth and genitals (private parts). Wash Face and genitals (private parts)  with your normal soap.   Wash thoroughly, paying special attention to the area where your surgery will be performed.  Thoroughly rinse your body with warm water from the neck down.  DO NOT shower/wash with your normal soap after using and rinsing off the CHG Soap.  Pat yourself dry with a CLEAN TOWEL.  Wear CLEAN PAJAMAS to bed the night before surgery  Place CLEAN SHEETS on your bed the night before your surgery  DO NOT SLEEP WITH PETS.   Day of Surgery:  Take a shower with CHG soap. Wear Clean/Comfortable clothing the morning of surgery Do not apply any deodorants/lotions.   Remember to brush your teeth WITH YOUR REGULAR TOOTHPASTE.    If you received a COVID test during your pre-op visit, it is requested that  you wear a mask when out in public, stay away from anyone that may not be feeling well, and notify your surgeon if you develop symptoms. If you have been in contact with anyone that has tested positive in the last 10 days, please notify your surgeon.    Please read over the following fact sheets that you were given.

## 2022-02-11 ENCOUNTER — Encounter (HOSPITAL_COMMUNITY): Payer: Self-pay

## 2022-02-11 ENCOUNTER — Other Ambulatory Visit: Payer: Self-pay

## 2022-02-11 ENCOUNTER — Encounter (HOSPITAL_COMMUNITY)
Admission: RE | Admit: 2022-02-11 | Discharge: 2022-02-11 | Disposition: A | Discharge status: Home / Self Care | Payer: PPO | Source: Ambulatory Visit | Attending: Orthopedic Surgery | Admitting: Orthopedic Surgery

## 2022-02-11 VITALS — BP 145/77 | HR 58 | Temp 97.4°F | Resp 18 | Ht 66.0 in | Wt 221.4 lb

## 2022-02-11 DIAGNOSIS — Z9989 Dependence on other enabling machines and devices: Secondary | ICD-10-CM | POA: Diagnosis not present

## 2022-02-11 DIAGNOSIS — Z87891 Personal history of nicotine dependence: Secondary | ICD-10-CM | POA: Diagnosis not present

## 2022-02-11 DIAGNOSIS — R7303 Prediabetes: Secondary | ICD-10-CM | POA: Diagnosis not present

## 2022-02-11 DIAGNOSIS — M47896 Other spondylosis, lumbar region: Secondary | ICD-10-CM | POA: Diagnosis not present

## 2022-02-11 DIAGNOSIS — M5136 Other intervertebral disc degeneration, lumbar region: Secondary | ICD-10-CM | POA: Diagnosis not present

## 2022-02-11 DIAGNOSIS — I700 Atherosclerosis of aorta: Secondary | ICD-10-CM | POA: Diagnosis not present

## 2022-02-11 DIAGNOSIS — K589 Irritable bowel syndrome without diarrhea: Secondary | ICD-10-CM | POA: Diagnosis not present

## 2022-02-11 DIAGNOSIS — N400 Enlarged prostate without lower urinary tract symptoms: Secondary | ICD-10-CM | POA: Insufficient documentation

## 2022-02-11 DIAGNOSIS — M47816 Spondylosis without myelopathy or radiculopathy, lumbar region: Secondary | ICD-10-CM | POA: Diagnosis not present

## 2022-02-11 DIAGNOSIS — M1711 Unilateral primary osteoarthritis, right knee: Secondary | ICD-10-CM | POA: Diagnosis not present

## 2022-02-11 DIAGNOSIS — K219 Gastro-esophageal reflux disease without esophagitis: Secondary | ICD-10-CM | POA: Diagnosis not present

## 2022-02-11 DIAGNOSIS — M109 Gout, unspecified: Secondary | ICD-10-CM | POA: Diagnosis not present

## 2022-02-11 DIAGNOSIS — E785 Hyperlipidemia, unspecified: Secondary | ICD-10-CM | POA: Insufficient documentation

## 2022-02-11 DIAGNOSIS — G4733 Obstructive sleep apnea (adult) (pediatric): Secondary | ICD-10-CM | POA: Diagnosis not present

## 2022-02-11 DIAGNOSIS — Z01812 Encounter for preprocedural laboratory examination: Secondary | ICD-10-CM | POA: Insufficient documentation

## 2022-02-11 DIAGNOSIS — Z01818 Encounter for other preprocedural examination: Secondary | ICD-10-CM

## 2022-02-11 DIAGNOSIS — M4800 Spinal stenosis, site unspecified: Secondary | ICD-10-CM | POA: Insufficient documentation

## 2022-02-11 DIAGNOSIS — J45909 Unspecified asthma, uncomplicated: Secondary | ICD-10-CM | POA: Insufficient documentation

## 2022-02-11 LAB — BASIC METABOLIC PANEL
Anion gap: 7 (ref 5–15)
BUN: 13 mg/dL (ref 8–23)
CO2: 26 mmol/L (ref 22–32)
Calcium: 9.1 mg/dL (ref 8.9–10.3)
Chloride: 103 mmol/L (ref 98–111)
Creatinine, Ser: 1.03 mg/dL (ref 0.61–1.24)
GFR, Estimated: >60 mL/min (ref >60)
Glucose, Bld: 112 mg/dL — ABNORMAL HIGH (ref 70–99)
Potassium: 4.4 mmol/L (ref 3.5–5.1)
Sodium: 136 mmol/L (ref 135–145)

## 2022-02-11 LAB — URINALYSIS, COMPLETE (UACMP) WITH MICROSCOPIC
Bacteria, UA: NONE SEEN
Bilirubin Urine: NEGATIVE
Glucose, UA: NEGATIVE mg/dL
Hgb urine dipstick: NEGATIVE
Ketones, ur: NEGATIVE mg/dL
Leukocytes,Ua: NEGATIVE
Nitrite: NEGATIVE
Protein, ur: NEGATIVE mg/dL
Specific Gravity, Urine: 1.012 (ref 1.005–1.030)
pH: 6 (ref 5.0–8.0)

## 2022-02-11 LAB — CBC
HCT: 43.9 % (ref 39.0–52.0)
Hemoglobin: 14.6 g/dL (ref 13.0–17.0)
MCH: 27.8 pg (ref 26.0–34.0)
MCHC: 33.3 g/dL (ref 30.0–36.0)
MCV: 83.6 fL (ref 80.0–100.0)
Platelets: 258 10*3/uL (ref 150–400)
RBC: 5.25 MIL/uL (ref 4.22–5.81)
RDW: 14.6 % (ref 11.5–15.5)
WBC: 9.1 10*3/uL (ref 4.0–10.5)
nRBC: 0 % (ref 0.0–0.2)

## 2022-02-11 LAB — SURGICAL PCR SCREEN
MRSA, PCR: NEGATIVE
Staphylococcus aureus: NEGATIVE

## 2022-02-11 NOTE — Progress Notes (Signed)
PCP - Dr. Catalina Antigua Cardiologist - denies. Saw Dr. Dorris Carnes once in 2018 for chest pain.   PPM/ICD - n/a Device Orders - n/a Rep Notified - n/a  Chest x-ray - n/a EKG - 05/10/21 Stress Test - 01/12/02 ECHO - 01/13/11 Cardiac Cath - 09/04/16  Sleep Study - +OSA. Wears CPAP nightly. Pressure setting 14  Pre- diabetes  Blood Thinner Instructions: n/a Aspirin Instructions: n/a  ERAS Protcol - Clear liquids until 09:10 A.M. day of surgery. PRE-SURGERY Ensure or G2- Ensure  COVID TEST- n/a   Anesthesia review: Yes. Due to history of chest pain. Patient denies any current or recent chest pain.   Patient denies shortness of breath, fever, cough and chest pain at PAT appointment   All instructions explained to the patient, with a verbal understanding of the material. Patient agrees to go over the instructions while at home for a better understanding. The opportunity to ask questions was provided.

## 2022-02-12 LAB — URINE CULTURE: Culture: NO GROWTH

## 2022-02-12 NOTE — Anesthesia Preprocedure Evaluation (Addendum)
Anesthesia Evaluation  Patient identified by MRN, date of birth, ID band Patient awake    Reviewed: Allergy & Precautions, NPO status , Patient's Chart, lab work & pertinent test results  History of Anesthesia Complications (+) PONV and history of anesthetic complications  Airway Mallampati: III       Dental  (+) Partial Lower, Partial Upper   Pulmonary Current Smoker and Patient abstained from smoking.,    Pulmonary exam normal        Cardiovascular Normal cardiovascular exam     Neuro/Psych  Headaches, PSYCHIATRIC DISORDERS Anxiety Depression    GI/Hepatic Neg liver ROS, GERD  Medicated,  Endo/Other    Renal/GU   negative genitourinary   Musculoskeletal  (+) Arthritis , Osteoarthritis,    Abdominal (+) + obese,   Peds  Hematology   Anesthesia Other Findings ? The left ventricular systolic function is normal. ? LV end diastolic pressure is normal. ? The left ventricular ejection fraction is 55-65% by visual estimate. ? LV end diastolic pressure is normal.   1. Diffuse mild nonobstructive disease 2. Normal LV function 3. Normal right heart and LV filling pressures 4. Normal cardiac output.   Plan: Medical therapy.    Reproductive/Obstetrics                           Anesthesia Physical Anesthesia Plan  ASA: 2  Anesthesia Plan: General and Spinal   Post-op Pain Management: Regional block*   Induction: Intravenous  PONV Risk Score and Plan: Ondansetron and Dexamethasone  Airway Management Planned: LMA  Additional Equipment: None  Intra-op Plan:   Post-operative Plan: Extubation in OR  Informed Consent: I have reviewed the patients History and Physical, chart, labs and discussed the procedure including the risks, benefits and alternatives for the proposed anesthesia with the patient or authorized representative who has indicated his/her understanding and acceptance.      Dental advisory given  Plan Discussed with: CRNA  Anesthesia Plan Comments: (PAT note written 02/12/2022 by Myra Gianotti, PA-C. He has had multiple back surgeries. He did get a spinal for right TKA on 05/14/21, but transitioned to LMA general anesthesia after he reacted to skin incision.)       Anesthesia Quick Evaluation

## 2022-02-12 NOTE — Progress Notes (Signed)
Anesthesia Chart Review:  Case: [id number] Date/Time: 02/18/22 1155   Procedure: RIGHT KNEE PATELLA REPLACEMENT, CEMENTED (Right: Knee)   Anesthesia type: Spinal   Pre-op diagnosis: RIGHT KNEE PATELLO-FEMORAL OSTEOARTHRITIS   Location: [facility] OR ROOM 06 / [place] [facility]   Surgeons: Meredith Pel, MD       DISCUSSION: Patient is a 66 year old male scheduled for the above procedure.  History includes smoking, postoperative N/V, HLD, childhood murmur, GERD, IBS, pre-diabetes, OSA (uses CPAP), gout, asthma, BPH, spinal surgery (L5-S1 fusion 12/24/99; C5-6 ACDF 12/22/00; L4-5 pedicle screws/posterolateral arthrodesis, removal L5-S1 pedicle screws 02/19/10; right L3-4 discectomy 09/14/12; L3-4 PLIF 06/29/19), osteoarthritis (left TKA 06/16/17, left TSA 08/19/18, right TKA 05/14/21).   Patient was seen by cardiologist Dorris Carnes, MD in 2018 for evaluation of intermittent CP and coronary calcifications seen on CT. Had cath 09/04/2016 showing diffuse mild nonobstructive disease, normal LV function EF 55-65%, normal right heart and LV filling pressures, normal cardiac output. Medical therapy recommended.  He has had multiple back surgeries. He did get a spinal for right TKA on 05/14/21, but transitioned to LMA general anesthesia after he reacted to skin incision.  Anesthesiologist to evaluate on the day of surgery and determine definitive anesthesia plan.   VS: BP (!) 145/77   Pulse (!) 58   Temp (!) 36.3 C (Oral)   Resp 18   Ht '5\' 6"'$  (1.676 m)   Wt 100.4 kg   SpO2 97%   BMI 35.73 kg/m    PROVIDERS: Leeanne Rio, MD is PCP  Star Age, MD is neurologist Nicolette Bang, MD is urologist   LABS: Labs reviewed: Acceptable for surgery. A1c 5.9% on 05/10/21. (all labs ordered are listed, but only abnormal results are displayed)  Labs Reviewed  BASIC METABOLIC PANEL - Abnormal; Notable for the following components:      Result Value   Glucose, Bld 112 (*)    All other components within normal  limits  SURGICAL PCR SCREEN  URINE CULTURE  CBC  URINALYSIS, COMPLETE (UACMP) WITH MICROSCOPIC   PFTs > 5 years ago.   IMAGES: Xray Right knee 4 views 08/19/21: - Status post right total knee on/in September 2022 posttraumatic increase in pain - Oblique and AP lateral x-rays show tibiofemoral components no patellar component - There is no sign of loosening or infection - Alignment is normal - Impression normal x-ray right knee  CT L-spine 08/23/20: IMPRESSION: 1. No acute osseous injury of the lumbar spine. 2. At L2-3 there is a broad-based disc bulge with a right paracentral disc protrusion. Moderate bilateral facet arthropathy. Mild-moderate spinal stenosis. 3. Posterior lumbar interbody fusion from L3 through S1 without hardware failure or complication. 4. Lumbar spine spondylosis as described above. 5. Aortic Atherosclerosis (ICD10-I70.0).    EKG: 05/10/21: Normal sinus rhythm Incomplete right bundle branch block Since last tracing rate faster Confirmed by Dorris Carnes [phone]) on 05/10/2021 5:44:17 PM   CV: RHC/LHC 09/04/16:            The left ventricular systolic function is normal.            LV end diastolic pressure is normal.            The left ventricular ejection fraction is 55-65% by visual estimate.            LV end diastolic pressure is normal.   1. Diffuse mild nonobstructive disease 2. Normal LV function 3. Normal right heart and LV filling pressures 4. Normal cardiac output.  Plan: Medical therapy.   Past Medical History:  Diagnosis Date   Anxiety    Arthritis    Asthma    BPH (benign prostatic hyperplasia)    Complication of anesthesia    pt had a hard time being able to move after spinal anesthesia , 3-4 hours   Depression    GERD (gastroesophageal reflux disease)    Gout    no meds   Headache(784.0)    otc meds prn   Heart murmur    dx as a child, no problems as an adult   Hyperlipidemia    IBS (irritable bowel syndrome)     PONV (postoperative nausea and vomiting)    Pre-diabetes    Borderline, diet and exercise, no med   Sleep apnea    uses CIPAP machine at night   Wears partial dentures    bottom partial    Past Surgical History:  Procedure Laterality Date   APPENDECTOMY     BACK SURGERY  2002   neck and back fusion   BIOPSY  12/27/2015   Procedure: BIOPSY;  Surgeon: Rogene Houston, MD;  [facility];  Service: Endoscopy;;  Fundus biopsies and duodenal biopsies   BIOPSY  02/10/2020   Procedure: BIOPSY;  Surgeon: Rogene Houston, MD;  [facility];  Service: Endoscopy;;  antral   CARDIAC CATHETERIZATION     CARDIAC CATHETERIZATION N/A 09/04/2016   Procedure: Right/Left Heart Cath and Coronary Angiography;  Surgeon: Peter M Martinique, MD;  Location: [place] [facility];  Service: Cardiovascular;  Laterality: N/A;   CHOLECYSTECTOMY     CHONDROPLASTY  08/15/2011   Procedure: CHONDROPLASTY;  Surgeon: [name], MD;  Location: [facility];  Service: Orthopedics;  Laterality: Left;   COLONOSCOPY  06/27/2011   Procedure: COLONOSCOPY;  Surgeon: Rogene Houston, MD;  [facility];  Service: Endoscopy;  Laterality: N/A;  9:00 / Pt to be here at 9am for 10:45 procedure, benign polyps removed   COLONOSCOPY N/A 10/05/2014   Procedure: COLONOSCOPY;  Surgeon: Rogene Houston, MD;  [facility];  Service: Endoscopy;  Laterality: N/A;  930   COLONOSCOPY N/A 02/11/2018   Procedure: COLONOSCOPY;  Surgeon: Rogene Houston, MD;  [facility];  Service: Endoscopy;  Laterality: N/A;  830   ESOPHAGOGASTRODUODENOSCOPY N/A 12/27/2015   Procedure: ESOPHAGOGASTRODUODENOSCOPY (EGD);  Surgeon: Rogene Houston, MD;  [facility];  Service: Endoscopy;  Laterality: N/A;  3:00   ESOPHAGOGASTRODUODENOSCOPY (EGD) WITH PROPOFOL N/A 02/10/2020   Procedure: ESOPHAGOGASTRODUODENOSCOPY (EGD) WITH PROPOFOL;  Surgeon: Rogene Houston, MD;  [facility];  Service:  Endoscopy;  Laterality: N/A;  155   HERNIA REPAIR     umbilical hernia   JOINT REPLACEMENT     knee and shoulder   KNEE ARTHROSCOPY     left knee   KNEE ARTHROSCOPY     right knee    LUMBAR LAMINECTOMY/DECOMPRESSION MICRODISCECTOMY  09/14/2012   Procedure: LUMBAR LAMINECTOMY/DECOMPRESSION MICRODISCECTOMY 1 LEVEL;  Surgeon: Floyce Stakes, MD;  Location: [place] [facility];  Service: Neurosurgery;  Laterality: Right;  Right Lumbar three-four Diskectomy   neck fusion  2005   POLYPECTOMY  02/11/2018   Procedure: POLYPECTOMY;  Surgeon: Rogene Houston, MD;  [facility];  Service: Endoscopy;;  colon   REVISION TOTAL SHOULDER TO REVERSE TOTAL SHOULDER Left 12/16/2018   Procedure: REVISION TOTAL SHOULDER TO REVERSE TOTAL SHOULDER;  Surgeon: Meredith Pel, MD;  Location:  [place] OR;  Service: Orthopedics;  Laterality: Left;   SHOULDER ARTHROSCOPY WITH BICEPSTENOTOMY Left 09/23/2013   Procedure: SHOULDER ARTHROSCOPY WITH BICEPSTENOTOMY AND EXTENSIVE DEBRIDEMENT;  Surgeon: Carole Civil, MD;  [facility];  Service: Orthopedics;  Laterality: Left;   SHOULDER ARTHROSCOPY WITH ROTATOR CUFF REPAIR Left 05/05/2014   Procedure: SHOULDER ARTHROSCOPY LIMITED DEBRIDEMENT;  Surgeon: Carole Civil, MD;  [facility];  Service: Orthopedics;  Laterality: Left;   SHOULDER OPEN ROTATOR CUFF REPAIR Left 05/05/2014   Procedure: ROTATOR CUFF REPAIR SHOULDER OPEN;  Surgeon: Carole Civil, MD;  [facility];  Service: Orthopedics;  Laterality: Left;   SHOULDER OPEN ROTATOR CUFF REPAIR Left 12/16/2018   Procedure: LEFT SHOULDER POSSIBLE SUBSCAPULARIS REPAIR VS. REVISION TO REVERSE TOTAL SHOULDER REPLACEMENT;  Surgeon: Meredith Pel, MD;  Location: [place];  Service: Orthopedics;  Laterality: Left;   SHOULDER SURGERY Right    Open Mumford procedure   SPINAL FUSION     x2, 2003 and 2007   TOTAL KNEE ARTHROPLASTY Left 06/16/2017   Procedure: LEFT TOTAL KNEE ARTHROPLASTY;   Surgeon: Carole Civil, MD;  [facility];  Service: Orthopedics;  Laterality: Left;   TOTAL KNEE ARTHROPLASTY Right 05/14/2021   Procedure: TOTAL KNEE ARTHROPLASTY;  Surgeon: Carole Civil, MD;  [facility];  Service: Orthopedics;  Laterality: Right;   TOTAL SHOULDER ARTHROPLASTY Left 08/19/2018   Procedure: left shoulder replacement;  Surgeon: Meredith Pel, MD;  Location: [place];  Service: Orthopedics;  Laterality: Left;    MEDICATIONS:  acetaminophen (TYLENOL) 500 MG tablet   EPINEPHrine 0.3 mg/0.3 mL IJ SOAJ injection   HYDROcodone-acetaminophen (NORCO) 10-325 MG tablet   Menthol, Topical Analgesic, (ICY HOT EX)   methocarbamol (ROBAXIN) 500 MG tablet   nitroGLYCERIN (NITROSTAT) 0.4 MG SL tablet   pantoprazole (PROTONIX) 40 MG tablet   pregabalin (LYRICA) 50 MG capsule   PROAIR HFA 108 (90 Base) MCG/ACT inhaler   rosuvastatin (CRESTOR) 10 MG tablet   sertraline (ZOLOFT) 50 MG tablet   No current facility-administered medications for this encounter.    Myra Gianotti, PA-C Surgical Short Stay/Anesthesiology [facility] Phone [phone] [facility] Phone [phone] 02/12/2022 5:13 PM

## 2022-02-13 ENCOUNTER — Other Ambulatory Visit: Payer: Self-pay | Admitting: Surgical

## 2022-02-13 ENCOUNTER — Telehealth: Payer: Self-pay | Admitting: Radiology

## 2022-02-13 ENCOUNTER — Other Ambulatory Visit: Payer: Self-pay | Admitting: Orthopedic Surgery

## 2022-02-13 ENCOUNTER — Telehealth: Payer: Self-pay | Admitting: Orthopedic Surgery

## 2022-02-13 ENCOUNTER — Other Ambulatory Visit: Payer: Self-pay | Admitting: Radiology

## 2022-02-13 DIAGNOSIS — Z96651 Presence of right artificial knee joint: Secondary | ICD-10-CM

## 2022-02-13 MED ORDER — HYDROCODONE-ACETAMINOPHEN 5-325 MG PO TABS: 1.0000 | ORAL_TABLET | Freq: Three times a day (TID) | ORAL | 0 refills | Status: DC | PRN

## 2022-02-13 NOTE — Progress Notes (Signed)
Meds ordered this encounter  Medications   HYDROcodone-acetaminophen (NORCO/VICODIN) 5-325 MG tablet    Sig: Take 1 tablet by mouth every 8 (eight) hours as needed for moderate pain.    Dispense:  21 tablet    Refill:  0

## 2022-02-13 NOTE — Telephone Encounter (Signed)
Sent in [place] '5mg'$ , take q8h prn leading up to surgery.

## 2022-02-13 NOTE — Telephone Encounter (Signed)
[name] called about an update for pain medication for husband to hold him over until his surgery date. Please send to pharmacy on file. She is asking for a call once medication has been sent in. Phone number is [phone].

## 2022-02-13 NOTE — Telephone Encounter (Signed)
Addressed in another msg

## 2022-02-13 NOTE — Telephone Encounter (Signed)
Refill request received via fax for  Hydrocodone [place]

## 2022-02-13 NOTE — Telephone Encounter (Signed)
We received a fax from pharmacy requesting a refill of hydrocodone.  Patient is now seeing Dr Marlou Sa at [facility] and is scheduled for surgery 02/18/22.  Please advise on refill?

## 2022-02-13 NOTE — Telephone Encounter (Signed)
Pls advise.  

## 2022-02-13 NOTE — Telephone Encounter (Signed)
Patient's wife Christopher [name] called asked if Dr Marlou Sa can call in something for pain for the patient. The pharmacy said the Rx would need to called back in because it was never filled. The number to contact Christopher [name] is [phone]

## 2022-02-13 NOTE — Telephone Encounter (Signed)
Tried calling to advise. No answer, no VM picked up to LM

## 2022-02-17 MED ORDER — TRANEXAMIC ACID 1000 MG/10ML IV SOLN
2000.0000 mg | INTRAVENOUS | Status: DC
  Filled 2022-02-17: qty 20

## 2022-02-18 ENCOUNTER — Other Ambulatory Visit: Payer: Self-pay

## 2022-02-18 ENCOUNTER — Observation Stay (HOSPITAL_COMMUNITY)
Admission: RE | Admit: 2022-02-18 | Discharge: 2022-02-19 | Disposition: A | Discharge status: Home / Self Care | Payer: PPO | Source: Ambulatory Visit | Attending: Orthopedic Surgery | Admitting: Orthopedic Surgery

## 2022-02-18 ENCOUNTER — Encounter (HOSPITAL_COMMUNITY)
Admission: RE | Disposition: A | Discharge status: Home / Self Care | Payer: Self-pay | Source: Ambulatory Visit | Attending: Orthopedic Surgery

## 2022-02-18 ENCOUNTER — Encounter (HOSPITAL_COMMUNITY): Payer: Self-pay | Admitting: Orthopedic Surgery

## 2022-02-18 ENCOUNTER — Ambulatory Visit (HOSPITAL_COMMUNITY): Payer: PPO | Admitting: Vascular Surgery

## 2022-02-18 ENCOUNTER — Ambulatory Visit (HOSPITAL_BASED_OUTPATIENT_CLINIC_OR_DEPARTMENT_OTHER): Payer: PPO | Admitting: Anesthesiology

## 2022-02-18 DIAGNOSIS — M25569 Pain in unspecified knee: Secondary | ICD-10-CM | POA: Diagnosis present

## 2022-02-18 DIAGNOSIS — Z9889 Other specified postprocedural states: Secondary | ICD-10-CM

## 2022-02-18 DIAGNOSIS — J45909 Unspecified asthma, uncomplicated: Secondary | ICD-10-CM | POA: Diagnosis not present

## 2022-02-18 DIAGNOSIS — Z96653 Presence of artificial knee joint, bilateral: Secondary | ICD-10-CM | POA: Diagnosis not present

## 2022-02-18 DIAGNOSIS — M1711 Unilateral primary osteoarthritis, right knee: Principal | ICD-10-CM

## 2022-02-18 DIAGNOSIS — F17210 Nicotine dependence, cigarettes, uncomplicated: Secondary | ICD-10-CM | POA: Diagnosis not present

## 2022-02-18 DIAGNOSIS — Z79899 Other long term (current) drug therapy: Secondary | ICD-10-CM | POA: Diagnosis not present

## 2022-02-18 DIAGNOSIS — Z01818 Encounter for other preprocedural examination: Secondary | ICD-10-CM

## 2022-02-18 DIAGNOSIS — Z96612 Presence of left artificial shoulder joint: Secondary | ICD-10-CM | POA: Insufficient documentation

## 2022-02-18 HISTORY — PX: PATELLA-FEMORAL ARTHROPLASTY: SHX5037

## 2022-02-18 SURGERY — ARTHROPLASTY, PATELLOFEMORAL
Anesthesia: General | Site: Knee | Laterality: Right

## 2022-02-18 MED ORDER — FENTANYL CITRATE (PF) 100 MCG/2ML IJ SOLN
INTRAMUSCULAR | Status: AC
  Administered 2022-02-18: 100 ug via INTRAVENOUS
  Filled 2022-02-18: qty 2

## 2022-02-18 MED ORDER — ACETAMINOPHEN 10 MG/ML IV SOLN
1000.0000 mg | Freq: Once | INTRAVENOUS | Status: DC | PRN
Start: 2022-02-18 — End: 2022-02-18
  Administered 2022-02-18: 1000 mg via INTRAVENOUS

## 2022-02-18 MED ORDER — ASPIRIN 81 MG PO CHEW
81.0000 mg | CHEWABLE_TABLET | Freq: Two times a day (BID) | ORAL | Status: DC
  Administered 2022-02-18 – 2022-02-19 (×2): 81 mg via ORAL
  Filled 2022-02-18 (×2): qty 1

## 2022-02-18 MED ORDER — MENTHOL 3 MG MT LOZG: 1.0000 | LOZENGE | OROMUCOSAL | Status: DC | PRN

## 2022-02-18 MED ORDER — MEPERIDINE HCL 25 MG/ML IJ SOLN: 6.2500 mg | INTRAMUSCULAR | Status: DC | PRN

## 2022-02-18 MED ORDER — PHENYLEPHRINE HCL-NACL 20-0.9 MG/250ML-% IV SOLN
INTRAVENOUS | Status: DC | PRN
  Administered 2022-02-18: 40 ug/min via INTRAVENOUS

## 2022-02-18 MED ORDER — MIDAZOLAM HCL 2 MG/2ML IJ SOLN: 2.0000 mg | Freq: Once | INTRAMUSCULAR | Status: AC

## 2022-02-18 MED ORDER — VANCOMYCIN HCL 1000 MG IV SOLR
INTRAVENOUS | Status: DC | PRN
  Administered 2022-02-18: 1000 mg via TOPICAL

## 2022-02-18 MED ORDER — SODIUM CHLORIDE 0.9% FLUSH
INTRAVENOUS | Status: DC | PRN
  Administered 2022-02-18: 20 mL

## 2022-02-18 MED ORDER — FENTANYL CITRATE (PF) 100 MCG/2ML IJ SOLN: 100.0000 ug | Freq: Once | INTRAMUSCULAR | Status: AC

## 2022-02-18 MED ORDER — ONDANSETRON HCL 4 MG PO TABS: 4.0000 mg | ORAL_TABLET | Freq: Four times a day (QID) | ORAL | Status: DC | PRN

## 2022-02-18 MED ORDER — CEFAZOLIN SODIUM-DEXTROSE 2-4 GM/100ML-% IV SOLN
2.0000 g | INTRAVENOUS | Status: AC
  Administered 2022-02-18: 2 g via INTRAVENOUS
  Filled 2022-02-18: qty 100

## 2022-02-18 MED ORDER — ACETAMINOPHEN 325 MG PO TABS: 325.0000 mg | ORAL_TABLET | Freq: Four times a day (QID) | ORAL | Status: DC | PRN

## 2022-02-18 MED ORDER — ORAL CARE MOUTH RINSE: 15.0000 mL | Freq: Once | OROMUCOSAL | Status: AC

## 2022-02-18 MED ORDER — VANCOMYCIN HCL 1000 MG IV SOLR
INTRAVENOUS | Status: AC
Start: 2022-02-18 — End: ?
  Filled 2022-02-18: qty 20

## 2022-02-18 MED ORDER — CLONIDINE HCL (ANALGESIA) 100 MCG/ML EP SOLN
EPIDURAL | Status: DC | PRN
  Administered 2022-02-18: 100 ug

## 2022-02-18 MED ORDER — LACTATED RINGERS IV SOLN: INTRAVENOUS | Status: DC

## 2022-02-18 MED ORDER — OXYCODONE HCL 5 MG PO TABS
ORAL_TABLET | ORAL | Status: AC
  Filled 2022-02-18: qty 1

## 2022-02-18 MED ORDER — ONDANSETRON HCL 4 MG/2ML IJ SOLN
INTRAMUSCULAR | Status: DC | PRN
  Administered 2022-02-18: 4 mg via INTRAVENOUS

## 2022-02-18 MED ORDER — CLONIDINE HCL (ANALGESIA) 100 MCG/ML EP SOLN
EPIDURAL | Status: DC | PRN
  Administered 2022-02-18: 100 ug via INTRA_ARTICULAR

## 2022-02-18 MED ORDER — DEXAMETHASONE SODIUM PHOSPHATE 10 MG/ML IJ SOLN
INTRAMUSCULAR | Status: DC | PRN
  Administered 2022-02-18: 10 mg

## 2022-02-18 MED ORDER — ALBUTEROL SULFATE (2.5 MG/3ML) 0.083% IN NEBU: 2.5000 mg | INHALATION_SOLUTION | Freq: Four times a day (QID) | RESPIRATORY_TRACT | Status: DC | PRN

## 2022-02-18 MED ORDER — ONDANSETRON HCL 4 MG/2ML IJ SOLN: 4.0000 mg | Freq: Four times a day (QID) | INTRAMUSCULAR | Status: DC | PRN

## 2022-02-18 MED ORDER — 0.9 % SODIUM CHLORIDE (POUR BTL) OPTIME
TOPICAL | Status: DC | PRN
  Administered 2022-02-18: 2000 mL
  Administered 2022-02-18: 3000 mL

## 2022-02-18 MED ORDER — BUPIVACAINE LIPOSOME 1.3 % IJ SUSP
INTRAMUSCULAR | Status: AC
  Filled 2022-02-18: qty 20

## 2022-02-18 MED ORDER — HYDROMORPHONE HCL 1 MG/ML IJ SOLN
0.5000 mg | INTRAMUSCULAR | Status: DC | PRN
  Administered 2022-02-18 – 2022-02-19 (×3): 0.5 mg via INTRAVENOUS
  Filled 2022-02-18 (×4): qty 0.5

## 2022-02-18 MED ORDER — PANTOPRAZOLE SODIUM 40 MG PO TBEC
40.0000 mg | DELAYED_RELEASE_TABLET | Freq: Every day | ORAL | Status: DC
  Administered 2022-02-19: 40 mg via ORAL
  Filled 2022-02-18 (×2): qty 1

## 2022-02-18 MED ORDER — TRANEXAMIC ACID-NACL 1000-0.7 MG/100ML-% IV SOLN
1000.0000 mg | INTRAVENOUS | Status: AC
  Administered 2022-02-18: 1000 mg via INTRAVENOUS
  Filled 2022-02-18: qty 100

## 2022-02-18 MED ORDER — POVIDONE-IODINE 7.5 % EX SOLN
Freq: Once | CUTANEOUS | Status: DC
  Filled 2022-02-18: qty 118

## 2022-02-18 MED ORDER — PROPOFOL 10 MG/ML IV BOLUS
INTRAVENOUS | Status: DC | PRN
  Administered 2022-02-18: 100 mg via INTRAVENOUS
  Administered 2022-02-18: 30 mg via INTRAVENOUS

## 2022-02-18 MED ORDER — ONDANSETRON HCL 4 MG/2ML IJ SOLN: 4.0000 mg | Freq: Once | INTRAMUSCULAR | Status: DC | PRN

## 2022-02-18 MED ORDER — OXYCODONE-ACETAMINOPHEN 5-325 MG PO TABS: 1.0000 | ORAL_TABLET | ORAL | 0 refills | Status: DC | PRN

## 2022-02-18 MED ORDER — HYDROMORPHONE HCL 1 MG/ML IJ SOLN
0.2500 mg | INTRAMUSCULAR | Status: DC | PRN
  Administered 2022-02-18: 0.25 mg via INTRAVENOUS
  Administered 2022-02-18 (×2): 0.5 mg via INTRAVENOUS

## 2022-02-18 MED ORDER — CLONIDINE HCL (ANALGESIA) 100 MCG/ML EP SOLN
EPIDURAL | Status: AC
Start: 2022-02-18 — End: ?
  Filled 2022-02-18: qty 10

## 2022-02-18 MED ORDER — OXYCODONE HCL 5 MG PO TABS
5.0000 mg | ORAL_TABLET | ORAL | Status: DC | PRN
  Administered 2022-02-18 – 2022-02-19 (×4): 10 mg via ORAL
  Filled 2022-02-18 (×4): qty 2

## 2022-02-18 MED ORDER — METOCLOPRAMIDE HCL 5 MG PO TABS: 5.0000 mg | ORAL_TABLET | Freq: Three times a day (TID) | ORAL | Status: DC | PRN

## 2022-02-18 MED ORDER — PHENOL 1.4 % MT LIQD: 1.0000 | OROMUCOSAL | Status: DC | PRN

## 2022-02-18 MED ORDER — MIDAZOLAM HCL 2 MG/2ML IJ SOLN
INTRAMUSCULAR | Status: AC
  Filled 2022-02-18: qty 2

## 2022-02-18 MED ORDER — ACETAMINOPHEN 500 MG PO TABS
1000.0000 mg | ORAL_TABLET | Freq: Four times a day (QID) | ORAL | Status: DC
  Administered 2022-02-19 (×3): 1000 mg via ORAL
  Filled 2022-02-18 (×4): qty 2

## 2022-02-18 MED ORDER — POVIDONE-IODINE 10 % EX SWAB
2.0000 | Freq: Once | CUTANEOUS | Status: AC
  Administered 2022-02-18: 2 via TOPICAL

## 2022-02-18 MED ORDER — BUPIVACAINE LIPOSOME 1.3 % IJ SUSP
INTRAMUSCULAR | Status: DC | PRN
  Administered 2022-02-18: 20 mL

## 2022-02-18 MED ORDER — PROPOFOL 500 MG/50ML IV EMUL
INTRAVENOUS | Status: DC | PRN
  Administered 2022-02-18: 75 ug/kg/min via INTRAVENOUS

## 2022-02-18 MED ORDER — ROPIVACAINE HCL 5 MG/ML IJ SOLN
INTRAMUSCULAR | Status: DC | PRN
  Administered 2022-02-18 (×6): 5 mL via PERINEURAL

## 2022-02-18 MED ORDER — CEFAZOLIN SODIUM-DEXTROSE 2-4 GM/100ML-% IV SOLN
2.0000 g | Freq: Three times a day (TID) | INTRAVENOUS | Status: AC
  Administered 2022-02-18 – 2022-02-19 (×2): 2 g via INTRAVENOUS
  Filled 2022-02-18 (×2): qty 100

## 2022-02-18 MED ORDER — POVIDONE-IODINE 10 % EX SWAB: 2.0000 "application " | Freq: Once | CUTANEOUS | Status: DC

## 2022-02-18 MED ORDER — ONDANSETRON HCL 4 MG/2ML IJ SOLN
INTRAMUSCULAR | Status: AC
  Filled 2022-02-18: qty 2

## 2022-02-18 MED ORDER — OXYCODONE HCL 5 MG PO TABS
5.0000 mg | ORAL_TABLET | Freq: Once | ORAL | Status: AC
  Administered 2022-02-18: 5 mg via ORAL

## 2022-02-18 MED ORDER — CHLORHEXIDINE GLUCONATE 0.12 % MT SOLN
15.0000 mL | Freq: Once | OROMUCOSAL | Status: AC
  Administered 2022-02-18: 15 mL via OROMUCOSAL
  Filled 2022-02-18: qty 15

## 2022-02-18 MED ORDER — METHOCARBAMOL 500 MG PO TABS
500.0000 mg | ORAL_TABLET | Freq: Four times a day (QID) | ORAL | Status: DC | PRN
  Administered 2022-02-18 (×2): 500 mg via ORAL
  Filled 2022-02-18: qty 1

## 2022-02-18 MED ORDER — IRRISEPT - 450ML BOTTLE WITH 0.05% CHG IN STERILE WATER, USP 99.95% OPTIME
TOPICAL | Status: DC | PRN
  Administered 2022-02-18: 450 mL via TOPICAL

## 2022-02-18 MED ORDER — HYDROMORPHONE HCL 1 MG/ML IJ SOLN
INTRAMUSCULAR | Status: AC
  Filled 2022-02-18: qty 1

## 2022-02-18 MED ORDER — ASPIRIN 81 MG PO CHEW: 81.0000 mg | CHEWABLE_TABLET | Freq: Two times a day (BID) | ORAL | 0 refills | Status: AC

## 2022-02-18 MED ORDER — MORPHINE SULFATE (PF) 4 MG/ML IV SOLN
INTRAVENOUS | Status: AC
  Filled 2022-02-18: qty 2

## 2022-02-18 MED ORDER — FENTANYL CITRATE (PF) 250 MCG/5ML IJ SOLN
INTRAMUSCULAR | Status: DC | PRN
Start: 2022-02-18 — End: 2022-02-18
  Administered 2022-02-18: 25 ug via INTRAVENOUS

## 2022-02-18 MED ORDER — ALBUTEROL SULFATE HFA 108 (90 BASE) MCG/ACT IN AERS: 2.0000 | INHALATION_SPRAY | Freq: Four times a day (QID) | RESPIRATORY_TRACT | Status: DC | PRN

## 2022-02-18 MED ORDER — MORPHINE SULFATE (PF) 4 MG/ML IV SOLN
INTRAVENOUS | Status: DC | PRN
  Administered 2022-02-18: 8 mg

## 2022-02-18 MED ORDER — METHOCARBAMOL 1000 MG/10ML IJ SOLN: 500.0000 mg | Freq: Four times a day (QID) | INTRAVENOUS | Status: DC | PRN

## 2022-02-18 MED ORDER — DOCUSATE SODIUM 100 MG PO CAPS
100.0000 mg | ORAL_CAPSULE | Freq: Two times a day (BID) | ORAL | Status: DC
Start: 2022-02-18 — End: 2022-02-19
  Administered 2022-02-18 – 2022-02-19 (×2): 100 mg via ORAL
  Filled 2022-02-18 (×2): qty 1

## 2022-02-18 MED ORDER — BUPIVACAINE HCL 0.25 % IJ SOLN
INTRAMUSCULAR | Status: DC | PRN
  Administered 2022-02-18: 30 mL

## 2022-02-18 MED ORDER — MIDAZOLAM HCL 2 MG/2ML IJ SOLN
INTRAMUSCULAR | Status: AC
  Administered 2022-02-18: 2 mg via INTRAVENOUS
  Filled 2022-02-18: qty 2

## 2022-02-18 MED ORDER — SODIUM CHLORIDE (PF) 0.9 % IJ SOLN
INTRAMUSCULAR | Status: AC
  Filled 2022-02-18: qty 20

## 2022-02-18 MED ORDER — FENTANYL CITRATE (PF) 250 MCG/5ML IJ SOLN
INTRAMUSCULAR | Status: AC
  Filled 2022-02-18: qty 5

## 2022-02-18 MED ORDER — PREGABALIN 50 MG PO CAPS
50.0000 mg | ORAL_CAPSULE | Freq: Two times a day (BID) | ORAL | Status: DC | PRN
  Administered 2022-02-18: 50 mg via ORAL
  Filled 2022-02-18: qty 1

## 2022-02-18 MED ORDER — METOCLOPRAMIDE HCL 5 MG/ML IJ SOLN: 5.0000 mg | Freq: Three times a day (TID) | INTRAMUSCULAR | Status: DC | PRN

## 2022-02-18 SURGICAL SUPPLY — 76 items
ATTUNE MED DOME PAT 38 KNEE (Knees) ×1 IMPLANT
BAG COUNTER SPONGE SURGICOUNT (BAG) ×3 IMPLANT
BAG DECANTER FOR FLEXI CONT (MISCELLANEOUS) ×2 IMPLANT
BANDAGE ESMARK 6X9 LF (GAUZE/BANDAGES/DRESSINGS) ×1 IMPLANT
BLADE SAG 18X100X1.27 (BLADE) ×2 IMPLANT
BLADE SAGITTAL (BLADE) ×2
BLADE SAW THK.89X75X18XSGTL (BLADE) ×1 IMPLANT
BNDG COHESIVE 6X5 TAN STRL LF (GAUZE/BANDAGES/DRESSINGS) ×2 IMPLANT
BNDG ELASTIC 6X10 VLCR STRL LF (GAUZE/BANDAGES/DRESSINGS) ×1 IMPLANT
BNDG ELASTIC 6X15 VLCR STRL LF (GAUZE/BANDAGES/DRESSINGS) ×2 IMPLANT
BNDG ESMARK 6X9 LF (GAUZE/BANDAGES/DRESSINGS) ×2
BOWL SMART MIX CTS (DISPOSABLE) IMPLANT
CEMENT BONE SIMPLEX SPEEDSET (Cement) ×1 IMPLANT
CLSR STERI-STRIP ANTIMIC 1/2X4 (GAUZE/BANDAGES/DRESSINGS) ×1 IMPLANT
CNTNR URN SCR LID CUP LEK RST (MISCELLANEOUS) ×1 IMPLANT
CONT SPEC 4OZ STRL OR WHT (MISCELLANEOUS) ×2
COOLER ICEMAN CLASSIC (MISCELLANEOUS) ×1 IMPLANT
COVER SURGICAL LIGHT HANDLE (MISCELLANEOUS) ×2 IMPLANT
CUFF TOURN SGL QUICK 34 (TOURNIQUET CUFF) ×2
CUFF TRNQT CYL 34X4.125X (TOURNIQUET CUFF) ×1 IMPLANT
DRAPE INCISE IOBAN 66X45 STRL (DRAPES) IMPLANT
DRAPE ORTHO SPLIT 77X108 STRL (DRAPES) ×6
DRAPE SURG ORHT 6 SPLT 77X108 (DRAPES) ×3 IMPLANT
DRAPE U-SHAPE 47X51 STRL (DRAPES) ×2 IMPLANT
DRSG AQUACEL AG ADV 3.5X14 (GAUZE/BANDAGES/DRESSINGS) ×2 IMPLANT
DURAPREP 26ML APPLICATOR (WOUND CARE) ×4 IMPLANT
ELECT CAUTERY BLADE 6.4 (BLADE) ×2 IMPLANT
ELECT REM PT RETURN 9FT ADLT (ELECTROSURGICAL) ×2
ELECTRODE REM PT RTRN 9FT ADLT (ELECTROSURGICAL) ×1 IMPLANT
GAUZE SPONGE 4X4 12PLY STRL (GAUZE/BANDAGES/DRESSINGS) ×2 IMPLANT
GLOVE BIOGEL PI IND STRL 7.0 (GLOVE) ×1 IMPLANT
GLOVE BIOGEL PI IND STRL 8 (GLOVE) ×1 IMPLANT
GLOVE BIOGEL PI INDICATOR 7.0 (GLOVE) ×1
GLOVE BIOGEL PI INDICATOR 8 (GLOVE) ×1
GLOVE ECLIPSE 7.0 STRL STRAW (GLOVE) ×2 IMPLANT
GLOVE ECLIPSE 8.0 STRL XLNG CF (GLOVE) ×2 IMPLANT
GLOVE SURG ENC MOIS LTX SZ6.5 (GLOVE) ×6 IMPLANT
GOWN STRL REUS W/ TWL LRG LVL3 (GOWN DISPOSABLE) ×3 IMPLANT
GOWN STRL REUS W/TWL LRG LVL3 (GOWN DISPOSABLE) ×6
HANDPIECE INTERPULSE COAX TIP (DISPOSABLE) ×2
HOOD PEEL AWAY FLYTE STAYCOOL (MISCELLANEOUS) ×6 IMPLANT
IMMOBILIZER KNEE 20 (SOFTGOODS)
IMMOBILIZER KNEE 20 THIGH 36 (SOFTGOODS) IMPLANT
IMMOBILIZER KNEE 22 UNIV (SOFTGOODS) IMPLANT
IMMOBILIZER KNEE 24 THIGH 36 (MISCELLANEOUS) IMPLANT
IMMOBILIZER KNEE 24 UNIV (MISCELLANEOUS)
KIT BASIN OR (CUSTOM PROCEDURE TRAY) ×2 IMPLANT
KIT TURNOVER KIT B (KITS) ×2 IMPLANT
MANIFOLD NEPTUNE II (INSTRUMENTS) ×2 IMPLANT
NDL SPNL 18GX3.5 QUINCKE PK (NEEDLE) ×1 IMPLANT
NEEDLE 22X1 1/2 (OR ONLY) (NEEDLE) ×4 IMPLANT
NEEDLE SPNL 18GX3.5 QUINCKE PK (NEEDLE) ×2 IMPLANT
NS IRRIG 1000ML POUR BTL (IV SOLUTION) ×7 IMPLANT
PACK TOTAL JOINT (CUSTOM PROCEDURE TRAY) ×2 IMPLANT
PAD ARMBOARD 7.5X6 YLW CONV (MISCELLANEOUS) ×4 IMPLANT
PAD CAST 4YDX4 CTTN HI CHSV (CAST SUPPLIES) ×1 IMPLANT
PADDING CAST COTTON 4X4 STRL (CAST SUPPLIES) ×2
PADDING CAST COTTON 6X4 STRL (CAST SUPPLIES) ×2 IMPLANT
SET HNDPC FAN SPRY TIP SCT (DISPOSABLE) ×1 IMPLANT
SPIKE FLUID TRANSFER (MISCELLANEOUS) ×2 IMPLANT
STRIP CLOSURE SKIN 1/2X4 (GAUZE/BANDAGES/DRESSINGS) ×4 IMPLANT
SUCTION FRAZIER HANDLE 10FR (MISCELLANEOUS) ×2
SUCTION TUBE FRAZIER 10FR DISP (MISCELLANEOUS) ×1 IMPLANT
SUT MNCRL AB 3-0 PS2 18 (SUTURE) ×2 IMPLANT
SUT VIC AB 0 CT1 27 (SUTURE) ×6
SUT VIC AB 0 CT1 27XBRD ANBCTR (SUTURE) ×3 IMPLANT
SUT VIC AB 1 CT1 36 (SUTURE) ×10 IMPLANT
SUT VIC AB 2-0 CT1 27 (SUTURE) ×8
SUT VIC AB 2-0 CT1 TAPERPNT 27 (SUTURE) ×4 IMPLANT
SYR 30ML LL (SYRINGE) ×6 IMPLANT
SYR TB 1ML LUER SLIP (SYRINGE) ×2 IMPLANT
TOWEL GREEN STERILE (TOWEL DISPOSABLE) ×4 IMPLANT
TOWEL GREEN STERILE FF (TOWEL DISPOSABLE) ×4 IMPLANT
TRAY CATH 16FR W/PLASTIC CATH (SET/KITS/TRAYS/PACK) IMPLANT
WATER STERILE IRR 1000ML POUR (IV SOLUTION) IMPLANT
YANKAUER SUCT BULB TIP NO VENT (SUCTIONS) ×2 IMPLANT

## 2022-02-18 NOTE — H&P (Signed)
Christopher [name] is an 66 y.o. male.   Chief Complaint: Right knee pain HPI: Christopher [name] is a 66 year old patient who is now almost a year out from right total knee replacement done at another location.  He reports significant right knee pain.  Has had left total knee replacement done years ago which he has done well with.  Patella on resurfaced on this total knee replacement and he does report a lot of anterior knee pain.  Presents now for operative management after extubation risk and benefits.  Infection work-up negative and loosening work-up also unremarkable with absence of start up pain and no lucencies on radiographs.  Patient is also over 3 months out from cortisone injection into the knee replacement on the right-hand side which was done by a different clinician.  Past Medical History:  Diagnosis Date   Anxiety    Arthritis    Asthma    BPH (benign prostatic hyperplasia)    Complication of anesthesia    pt had a hard time being able to move after spinal anesthesia , 3-4 hours   Depression    GERD (gastroesophageal reflux disease)    Gout    no meds   Headache(784.0)    otc meds prn   Heart murmur    dx as a child, no problems as an adult   Hyperlipidemia    IBS (irritable bowel syndrome)    PONV (postoperative nausea and vomiting)    Pre-diabetes    Borderline, diet and exercise, no med   Sleep apnea    uses CIPAP machine at night   Wears partial dentures    bottom partial    Past Surgical History:  Procedure Laterality Date   APPENDECTOMY     BACK SURGERY  2002   neck and back fusion   BIOPSY  12/27/2015   Procedure: BIOPSY;  Surgeon: Rogene Houston, MD;  [facility];  Service: Endoscopy;;  Fundus biopsies and duodenal biopsies   BIOPSY  02/10/2020   Procedure: BIOPSY;  Surgeon: Rogene Houston, MD;  [facility];  Service: Endoscopy;;  antral   CARDIAC CATHETERIZATION     CARDIAC CATHETERIZATION N/A 09/04/2016   Procedure: Right/Left Heart Cath  and Coronary Angiography;  Surgeon: Peter M Martinique, MD;  Location: [place] [facility];  Service: Cardiovascular;  Laterality: N/A;   CHOLECYSTECTOMY     CHONDROPLASTY  08/15/2011   Procedure: CHONDROPLASTY;  Surgeon: [name], MD;  Location[facility];  Service: Orthopedics;  Laterality: Left;   COLONOSCOPY  06/27/2011   Procedure: COLONOSCOPY;  Surgeon: Rogene Houston, MD;  [facility];  Service: Endoscopy;  Laterality: N/A;  9:00 / Pt to be here at 9am for 10:45 procedure, benign polyps removed   COLONOSCOPY N/A 10/05/2014   Procedure: COLONOSCOPY;  Surgeon: Rogene Houston, MD;  [facility];  Service: Endoscopy;  Laterality: N/A;  930   COLONOSCOPY N/A 02/11/2018   Procedure: COLONOSCOPY;  Surgeon: Rogene Houston, MD;  [facility];  Service: Endoscopy;  Laterality: N/A;  830   ESOPHAGOGASTRODUODENOSCOPY N/A 12/27/2015   Procedure: ESOPHAGOGASTRODUODENOSCOPY (EGD);  Surgeon: Rogene Houston, MD;  [facility];  Service: Endoscopy;  Laterality: N/A;  3:00   ESOPHAGOGASTRODUODENOSCOPY (EGD) WITH PROPOFOL N/A 02/10/2020   Procedure: ESOPHAGOGASTRODUODENOSCOPY (EGD) WITH PROPOFOL;  Surgeon: Rogene Houston, MD;  [facility];  Service: Endoscopy;  Laterality: N/A;  [place]  umbilical hernia   JOINT REPLACEMENT     knee and shoulder   KNEE ARTHROSCOPY     left knee   KNEE ARTHROSCOPY     right knee    LUMBAR LAMINECTOMY/DECOMPRESSION MICRODISCECTOMY  09/14/2012   Procedure: LUMBAR LAMINECTOMY/DECOMPRESSION MICRODISCECTOMY 1 LEVEL;  Surgeon: Floyce Stakes, MD;  Location: [place] [facility];  Service: Neurosurgery;  Laterality: Right;  Right Lumbar three-four Diskectomy   neck fusion  2005   POLYPECTOMY  02/11/2018   Procedure: POLYPECTOMY;  Surgeon: Rogene Houston, MD;  [facility];  Service: Endoscopy;;  colon   REVISION TOTAL SHOULDER TO REVERSE TOTAL SHOULDER Left 12/16/2018   Procedure: REVISION  TOTAL SHOULDER TO REVERSE TOTAL SHOULDER;  Surgeon: Meredith Pel, MD;  Location: [place];  Service: Orthopedics;  Laterality: Left;   SHOULDER ARTHROSCOPY WITH BICEPSTENOTOMY Left 09/23/2013   Procedure: SHOULDER ARTHROSCOPY WITH BICEPSTENOTOMY AND EXTENSIVE DEBRIDEMENT;  Surgeon: Carole Civil, MD;  [facility];  Service: Orthopedics;  Laterality: Left;   SHOULDER ARTHROSCOPY WITH ROTATOR CUFF REPAIR Left 05/05/2014   Procedure: SHOULDER ARTHROSCOPY LIMITED DEBRIDEMENT;  Surgeon: Carole Civil, MD;  [facility];  Service: Orthopedics;  Laterality: Left;   SHOULDER OPEN ROTATOR CUFF REPAIR Left 05/05/2014   Procedure: ROTATOR CUFF REPAIR SHOULDER OPEN;  Surgeon: Carole Civil, MD;  Location[facility];  Service: Orthopedics;  Laterality: Left;   SHOULDER OPEN ROTATOR CUFF REPAIR Left 12/16/2018   Procedure: LEFT SHOULDER POSSIBLE SUBSCAPULARIS REPAIR VS. REVISION TO REVERSE TOTAL SHOULDER REPLACEMENT;  Surgeon: Meredith Pel, MD;  Location: [place];  Service: Orthopedics;  Laterality: Left;   SHOULDER SURGERY Right    Open Mumford procedure   SPINAL FUSION     x2, 2003 and 2007   TOTAL KNEE ARTHROPLASTY Left 06/16/2017   Procedure: LEFT TOTAL KNEE ARTHROPLASTY;  Surgeon: Carole Civil, MD;  Location[facility];  Service: Orthopedics;  Laterality: Left;   TOTAL KNEE ARTHROPLASTY Right 05/14/2021   Procedure: TOTAL KNEE ARTHROPLASTY;  Surgeon: Carole Civil, MD;  Location: [facility];  Service: Orthopedics;  Laterality: Right;   TOTAL SHOULDER ARTHROPLASTY Left 08/19/2018   Procedure: left shoulder replacement;  Surgeon: Meredith Pel, MD;  Location: [place];  Service: Orthopedics;  Laterality: Left;    Family History  Problem Relation Age of Onset   Alzheimer's disease Father    Diabetes Father    Diabetes Other    Lung disease Other    Arthritis Other    Anesthesia problems Neg Hx    Hypotension Neg Hx    Malignant hyperthermia Neg Hx     Pseudochol deficiency Neg Hx    Sleep apnea Neg Hx    Social History:  reports that he has been smoking cigarettes. He has a 44.00 pack-year smoking history. He has never used smokeless tobacco. He reports that he does not drink alcohol and does not use drugs.  Allergies:  Allergies  Allergen Reactions   Celebrex [Celecoxib] Itching, Swelling and Other (See Comments)    All over   Cortisone Swelling    SWELLING REACTION UNSPECIFIED    Doxycycline Swelling and Other (See Comments)    Made tongue turn black    Prednisone Swelling    SWELLING REACTION UNSPECIFIED    Relafen [Nabumetone] Swelling    SWELLING REACTION UNSPECIFIED    Codeine Nausea And Vomiting and Other (See Comments)    Extreme stomach pain. This includes anything with the derivative of codeine in it. (  Does tolerate hydrocodone)    Aspirin Itching   Oxycodone-Acetaminophen Itching and Other (See Comments)    Can tolerate with benadryl     Medications Prior to Admission  Medication Sig Dispense Refill   acetaminophen (TYLENOL) 500 MG tablet Take 500-1,000 mg by mouth every 6 (six) hours as needed for moderate pain.     EPINEPHrine 0.3 mg/0.3 mL IJ SOAJ injection Inject 0.3 mg into the muscle as needed for anaphylaxis.     HYDROcodone-acetaminophen (NORCO/VICODIN) 5-325 MG tablet Take 1 tablet by mouth every 8 (eight) hours as needed for moderate pain. 21 tablet 0   Menthol, Topical Analgesic, (ICY HOT EX) Apply 1 application topically daily as needed (pain).     methocarbamol (ROBAXIN) 500 MG tablet Take 1 tablet (500 mg total) by mouth every 6 (six) hours as needed for muscle spasms. 60 tablet 5   pantoprazole (PROTONIX) 40 MG tablet TAKE (1) TABLET TWICE A DAY BEFORE MEALS. (Patient taking differently: Take 40 mg by mouth daily.) 60 tablet 0   pregabalin (LYRICA) 50 MG capsule Take 1 capsule (50 mg total) by mouth 2 (two) times daily. (Patient taking differently: Take 50 mg by mouth 2 (two) times daily as needed  (pain).) 60 capsule 1   PROAIR HFA 108 (90 Base) MCG/ACT inhaler Inhale 2 puffs into the lungs every 6 (six) hours as needed for wheezing or shortness of breath.      rosuvastatin (CRESTOR) 10 MG tablet Take 10 mg by mouth daily.     sertraline (ZOLOFT) 50 MG tablet Take 50 mg by mouth in the morning.     nitroGLYCERIN (NITROSTAT) 0.4 MG SL tablet PLACE (1) TABLET UNDER TONGUE EVERY 5 MINUTES UP TO (3) DOSES. IF NO RELIEF CALL 911. 25 tablet 2    No results found for this or any previous visit (from the past 48 hour(s)). No results found.  Review of Systems  Musculoskeletal:  Positive for arthralgias.  All other systems reviewed and are negative.   Blood pressure 136/62, pulse (!) 56, temperature 97.6 F (36.4 C), temperature source Oral, resp. rate 15, height '5\' 6"'$  (1.676 m), weight 101.6 kg, SpO2 97 %. Physical Exam Vitals reviewed.  HENT:     Head: Normocephalic.     Nose: Nose normal.     Mouth/Throat:     Mouth: Mucous membranes are moist.  Eyes:     Pupils: Pupils are equal, round, and reactive to light.  Cardiovascular:     Rate and Rhythm: Normal rate.     Pulses: Normal pulses.  Pulmonary:     Effort: Pulmonary effort is normal.  Abdominal:     General: Abdomen is flat.  Skin:    General: Skin is warm.     Capillary Refill: Capillary refill takes less than 2 seconds.  Neurological:     General: No focal deficit present.     Mental Status: He is alert.  Psychiatric:        Mood and Affect: Mood normal.   Right knee demonstrates no warmth with no effusion.  Extensor mechanism intact.  Range of motion is good with stable collateral cruciate ligaments to varus valgus stress at 0 and 30 degrees.  Extensor mechanism intact.  Assessment/Plan  Impression is right knee pain and effusion.  I think that the patella is the likely culprit in terms of pain generation in this knee.  Does not appear to be referred pain from the back or hip.  Patient has had very good  result with  his left total knee replacement which did have resurfaced patella.  Aspiration of the knee performed earlier this year did give him about 30 to 40% relief.  Plan at this time is to draw CBC DIF sed rate C-reactive protein which were normal at the time of this dictation.  The risk and benefits of surgery are discussed with Joneen Boers including not limited to knee stiffness infection as well as incomplete pain relief.  The rehabilitation should be slightly easier than with total knee replacement.  Patient understands the risk and benefits and would like to proceed.  All questions answered  Anderson Malta, MD 02/18/2022, 1:39 PM

## 2022-02-18 NOTE — Op Note (Signed)
NAME: Christopher [name], [place] MEDICAL RECORD NO: [id number] ACCOUNT NO: [id number] DATE OF BIRTH: 07-14-1956 FACILITY: [facility] LOCATION: [facility] PHYSICIAN: Yetta Barre. Marlou Sa, MD  Operative Report   DATE OF PROCEDURE: 02/18/2022  PREOPERATIVE DIAGNOSES:  Right knee patellar arthritis.  POSTOPERATIVE DIAGNOSES:  Right knee patellar arthritis.  PROCEDURES:  Right knee patellar resurfacing using DePuy cemented patella size 38 mm.  SURGEON:  Yetta Barre. Marlou Sa, MD  ASSISTANT:  Annie Main.  INDICATIONS:  The patient is a 66 year old patient with right knee pain.  He had knee replacement about 9-10 months ago.  He has had a fusion and knee pain since that time.  Patella not resurfaced and he does localize his pain to the anterior aspect of  the knee.  DESCRIPTION OF PROCEDURE:  The patient was brought to the operating room where general anesthetic was induced.  Preoperative antibiotics administered.  Timeout was called.  Right knee was prescrubbed with alcohol and Betadine, allowed to air dry.   Prepped with DuraPrep solution and draped in sterile manner.  Ioban used to cover the operative field.  Timeout was called.  Leg was elevated, exsanguinated with the Esmarch wrap for about 45 minutes.  Anterior approach to the knee was made.  Skin and  subcutaneous tissue were sharply divided.  IrriSept type solution was utilized after the incision as well as after the arthrotomy.  Clear fluid present within the knee joint, which was sent for cultures x 3.  No evidence of acute inflammation in the soft  tissues or in color of the fluid.  The interface between the cement and bone was inspected at the femoral and tibial components.  No components were loose. The tibial polyethylene spacer was also in good condition.  At this time, the patella was  inspected.  Wear was present.  Soft tissue dissection performed around the patella.  The patella was then cut down with the oscillating saw from 26 to 16 mm.  Three peg trials  were placed, 38 mm, which restored patellar height and had good tracking.   Lateral patellofemoral ligament was released.  Next, thorough irrigation was performed with 3 L of pouring irrigation.  Next, IrriSept type solution was allowed to sit in the knee for 3 minutes while cement was mixed.  The patella was then cemented into  position with the same and the cement was allowed to harden.  Excess cement was removed.  Tourniquet released.  Bleeding points encountered were controlled using electrocautery.  The capsule was anesthetized using a combination of Marcaine, saline and  Exparel.  Next, thorough irrigation was again performed with pouring irrigation.  The arthrotomy was closed over bolster after removing permanent sutures.  The closure was achieved using #1 Vicryl suture.  Before final closure, the IrriSept type solution  was used to irrigate the knee, a final time.  This was removed with the suction and then vancomycin powder placed into the knee joint and then the arthrotomy was closed.  Solution of Marcaine, morphine, clonidine then injected into the knee for  postoperative pain relief.  Next, the incision was closed using a combination of 0-Vicryl suture, 2-0 Vicryl suture, and 3-0 Monocryl with Steri-Strips and Aquacel dressing, Ace wrap, iceman and knee immobilizer placed.  Luke's assistance was required at  all times for opening, closing, mobilization of tissue, sawing, limb positioning and the knee cap positioning.  His assistance was a medical necessity.   MUK D: 02/18/2022 4:40:32 pm T: 02/18/2022 9:22:00 pm  JOB: [id number]/ [id number]

## 2022-02-18 NOTE — Anesthesia Procedure Notes (Signed)
Anesthesia Regional Block: Adductor canal block   Pre-Anesthetic Checklist: , timeout performed,  Correct Patient, Correct Site, Correct Laterality,  Correct Procedure, Correct Position, site marked,  Risks and benefits discussed,  Surgical consent,  Pre-op evaluation,  At surgeon's request and post-op pain management  Laterality: Lower and Right  Prep: chloraprep       Needles:  Injection technique: Single-shot  Needle Type: Echogenic Stimulator Needle     Needle Length: 9cm  Needle Gauge: 20   Needle insertion depth: 2 cm   Additional Needles:   Procedures:,,,, ultrasound used (permanent image in chart),,    Narrative:  Start time: 02/18/2022 11:50 AM End time: 02/18/2022 11:58 AM Injection made incrementally with aspirations every 5 mL.  Performed by: Personally  Anesthesiologist: Lyn Hollingshead, MD

## 2022-02-18 NOTE — Transfer of Care (Signed)
Immediate Anesthesia Transfer of Care Note  Patient: Christopher [name]  Procedure(s) Performed: RIGHT KNEE PATELLA REPLACEMENT, CEMENTED (Right: Knee)  Patient Location: PACU  Anesthesia Type:GA combined with regional for post-op pain  Level of Consciousness: awake, alert  and oriented  Airway & Oxygen Therapy: Patient Spontanous Breathing and Patient connected to face mask oxygen  Post-op Assessment: Report given to RN, Post -op Vital signs reviewed and stable, Patient moving all extremities X 4 and Patient able to stick tongue midline  Post vital signs: Reviewed  Last Vitals:  Vitals Value Taken Time  BP 106/58 02/18/22 1634  Temp 98.4   Pulse 53 02/18/22 1635  Resp 13 02/18/22 1635  SpO2 100 % 02/18/22 1635  Vitals shown include unvalidated device data.  Last Pain:  Vitals:   02/18/22 1031  TempSrc:   PainSc: 10-Worst pain ever         Complications: No notable events documented.

## 2022-02-18 NOTE — Brief Op Note (Signed)
    02/18/2022  4:35 PM  PATIENT:  [name]  66 y.o. male  PRE-OPERATIVE DIAGNOSIS:  RIGHT KNEE PATELLO-FEMORAL OSTEOARTHRITIS  POST-OPERATIVE DIAGNOSIS:  RIGHT KNEE PATELLO-FEMORAL OSTEOARTHRITIS  PROCEDURE:  Procedure(s): RIGHT KNEE PATELLA REPLACEMENT, CEMENTED  SURGEON:  Surgeon(s): Marlou Sa, Tonna Corner, MD  ASSISTANT: magnant pa  ANESTHESIA:   general  EBL: 15 ml    Total I/O In: 1000 [I.V.:1000] Out: 30 [Blood:30]  BLOOD ADMINISTERED: none  DRAINS: none   LOCAL MEDICATIONS USED:  marcaine mso4 clonidine exparel vanco powder  SPECIMEN:  No Specimen  COUNTS:  YES  TOURNIQUET:   Total Tourniquet Time Documented: Thigh (Right) - 45 minutes Total: Thigh (Right) - 45 minutes   DICTATION: .Other Dictation: Dictation Number [id number]  PLAN OF CARE: Discharge to home after PACU  PATIENT DISPOSITION:  PACU - hemodynamically stable

## 2022-02-18 NOTE — Anesthesia Procedure Notes (Signed)
Procedure Name: LMA Insertion Date/Time: 02/18/2022 2:19 PM  Performed by: Maude Leriche, CRNAPre-anesthesia Checklist: Patient identified, Emergency Drugs available, Suction available and Patient being monitored Patient Re-evaluated:Patient Re-evaluated prior to induction Oxygen Delivery Method: Circle system utilized Preoxygenation: Pre-oxygenation with 100% oxygen Induction Type: IV induction Ventilation: Mask ventilation without difficulty LMA: LMA inserted LMA Size: 5.0 Number of attempts: 1 Placement Confirmation: positive ETCO2 and breath sounds checked- equal and bilateral Tube secured with: Tape Dental Injury: Teeth and Oropharynx as per pre-operative assessment

## 2022-02-19 ENCOUNTER — Encounter (HOSPITAL_COMMUNITY): Payer: Self-pay | Admitting: Orthopedic Surgery

## 2022-02-19 DIAGNOSIS — M1711 Unilateral primary osteoarthritis, right knee: Secondary | ICD-10-CM | POA: Diagnosis not present

## 2022-02-19 LAB — CBC
HCT: 37.9 % — ABNORMAL LOW (ref 39.0–52.0)
Hemoglobin: 12.9 g/dL — ABNORMAL LOW (ref 13.0–17.0)
MCH: 28.1 pg (ref 26.0–34.0)
MCHC: 34 g/dL (ref 30.0–36.0)
MCV: 82.6 fL (ref 80.0–100.0)
Platelets: 243 10*3/uL (ref 150–400)
RBC: 4.59 MIL/uL (ref 4.22–5.81)
RDW: 14.5 % (ref 11.5–15.5)
WBC: 16.2 10*3/uL — ABNORMAL HIGH (ref 4.0–10.5)
nRBC: 0 % (ref 0.0–0.2)

## 2022-02-19 NOTE — Progress Notes (Signed)
Pain not managed- reports not getting relief-despite all measures tried.

## 2022-02-19 NOTE — Evaluation (Signed)
Physical Therapy Evaluation & Discharge Patient Details Name: Christopher [name] MRN: [id number] DOB: 16-Jan-1956 Today's Date: 02/19/2022  History of Present Illness  Pt is a 66 y.o. M who presents with right patella resurfacing following right TKA about one year ago now s/p right knee patella replacement, cemented 02/18/2022. Significant PMH: depression, IBS, bilateral TKA.  Clinical Impression  Prior to admission, pt was independent with all ADLs/IADLs and was not using an AD. Pt currently requiring min guard for transfers and supervision for ambulation of 238f with RW. Pt demonstrated good knowledge of precautions and HEP due to recent TKA. Educated on safe car transfers and progression of mobility. Recommending follow up PT upon discharge to address deficits in strength, ROM, and functional mobility. PT will sign off at this time due to level of independence but pt encouraged to continue mobilizing and performing HEP while inpatient. If pt has decline in functional mobility while inpatient, please reconsult.       Recommendations for follow up therapy are one component of a multi-disciplinary discharge planning process, led by the attending physician.  Recommendations may be updated based on patient status, additional functional criteria and insurance authorization.  Follow Up Recommendations Follow physician's recommendations for discharge plan and follow up therapies      Assistance Recommended at Discharge PRN  Patient can return home with the following  A little help with bathing/dressing/bathroom;Assistance with cooking/housework;Assist for transportation;Help with stairs or ramp for entrance    Equipment Recommendations None recommended by PT  Recommendations for Other Services       Functional Status Assessment Patient has had a recent decline in their functional status and demonstrates the ability to make significant improvements in function in a reasonable and predictable amount of  time.     Precautions / Restrictions Precautions Precautions: None Required Braces or Orthoses: Knee Immobilizer - Right Knee Immobilizer - Right: On when out of bed or walking Restrictions Weight Bearing Restrictions: Yes RLE Weight Bearing: Weight bearing as tolerated      Mobility  Bed Mobility Overal bed mobility: Modified Independent             General bed mobility comments: HOb elevated, use of bed rails    Transfers Overall transfer level: Needs assistance Equipment used: Rolling walker (2 wheels) Transfers: Sit to/from Stand Sit to Stand: Min guard           General transfer comment: min guard for safety    Ambulation/Gait Ambulation/Gait assistance: Supervision Gait Distance (Feet): 200 Feet Assistive device: Rolling walker (2 wheels) Gait Pattern/deviations: Step-through pattern, Decreased stride length, Antalgic Gait velocity: decreased Gait velocity interpretation: 1.31 - 2.62 ft/sec, indicative of limited community ambulator   General Gait Details: Pt demonstrating good sequencing and heel strike with RLE, supervision for safety  Stairs            Wheelchair Mobility    Modified Rankin (Stroke Patients Only)       Balance Overall balance assessment: Needs assistance Sitting-balance support: Feet supported, No upper extremity supported Sitting balance-Leahy Scale: Good     Standing balance support: Bilateral upper extremity supported, During functional activity Standing balance-Leahy Scale: Poor Standing balance comment: reliance on RW                             Pertinent Vitals/Pain      Home Living Family/patient expects to be discharged to:: Private residence Living Arrangements: Spouse/significant other Available Help at  Discharge: Family;Available 24 hours/day Type of Home: Mobile home Home Access: Ramped entrance       Home Layout: One level Home Equipment: Conservation officer, nature (2 wheels)      Prior  Function Prior Level of Function : Independent/Modified Independent             Mobility Comments: Retired       Journalist, newspaper   Dominant Hand: Right    Extremity/Trunk Assessment   Upper Extremity Assessment Upper Extremity Assessment: Overall WFL for tasks assessed    Lower Extremity Assessment Lower Extremity Assessment: RLE deficits/detail RLE Deficits / Details: expected post op deficits but able to perform LAQ and heel slides    Cervical / Trunk Assessment Cervical / Trunk Assessment: Normal  Communication   Communication: No difficulties  Cognition                                                General Comments      Exercises Total Joint Exercises Heel Slides: Right, 10 reps, Seated Long Arc Quad: Right, 10 reps, Seated Goniometric ROM: 5-85   Assessment/Plan    PT Assessment Patient does not need any further PT services  PT Problem List         PT Treatment Interventions      PT Goals (Current goals can be found in the Care Plan section)  Acute Rehab PT Goals Patient Stated Goal: to return home PT Goal Formulation: All assessment and education complete, DC therapy    Frequency       Co-evaluation               AM-PAC PT "6 Clicks" Mobility  Outcome Measure Help needed turning from your back to your side while in a flat bed without using bedrails?: None Help needed moving from lying on your back to sitting on the side of a flat bed without using bedrails?: None Help needed moving to and from a bed to a chair (including a wheelchair)?: A Little Help needed standing up from a chair using your arms (e.g., wheelchair or bedside chair)?: A Little Help needed to walk in hospital room?: A Little Help needed climbing 3-5 steps with a railing? : A Lot 6 Click Score: 19    End of Session Equipment Utilized During Treatment: Gait belt;Right knee immobilizer Activity Tolerance: Patient tolerated treatment well;Patient  limited by pain Patient left: in bed;with call bell/phone within reach Nurse Communication: Mobility status PT Visit Diagnosis: Other abnormalities of gait and mobility (R26.89);Difficulty in walking, not elsewhere classified (R26.2);Pain Pain - Right/Left: Right Pain - part of body: Knee    Time: 0806-0829 PT Time Calculation (min) (ACUTE ONLY): 23 min   Charges:   PT Evaluation $PT Eval Low Complexity: 1 Low PT Treatments $Gait Training: 8-22 mins      Mackie Pai, SPT Acute Rehabilitation Services  Office: [phone]   Mackie Pai 02/19/2022, 9:31 AM

## 2022-02-19 NOTE — TOC Transition Note (Signed)
Transition of Care [facility]) - CM/SW Discharge Note   Patient Details  Name: Christopher [name] MRN: [id number] Date of Birth: 09-30-1955  [facility] CM/SW Contact:  Sharin Mons, RN Phone Number: 02/19/2022, 12:09 PM   Clinical Narrative:     Patient will DC to: home Anticipated DC date: 02/19/2022 Family notified: yes Transport by: car      S/p R knee patellar resurfacing  Per MD patient ready for DC today.RN, patient, and patient's wife notified of DC. Wife to assist with care once d/c. Pt agreeable to home health services . Pt without preference. Referral made with [facility] accepted. Pt without DME needs.  Pt without Rx med concerns.  Post hospital follow up noted on AVS.  Wife will provide transportation to home.  RNCM will sign off for now as intervention is no longer needed. Please consult [place] again if new needs arise.   Final next level of care: Home w Home Health Services Barriers to Discharge: No Barriers Identified   Patient Goals and CMS Choice     Choice offered to / list presented to : Patient  Discharge Placement                       Discharge Plan and Services                          HH Arranged: PT [facility] Agency: [place] Date [place]: 02/19/22 Time HH Agency Contacted: 1209    Social Determinants of Health (SDOH) Interventions     Readmission Risk Interventions     No data to display

## 2022-02-19 NOTE — Anesthesia Postprocedure Evaluation (Signed)
Anesthesia Post Note  Patient: [name]  Procedure(s) Performed: RIGHT KNEE PATELLA REPLACEMENT, CEMENTED (Right: Knee)     Patient location during evaluation: PACU Anesthesia Type: General and Regional Level of consciousness: awake and alert Pain management: pain level controlled Vital Signs Assessment: post-procedure vital signs reviewed and stable Respiratory status: spontaneous breathing, nonlabored ventilation, respiratory function stable and patient connected to nasal cannula oxygen Cardiovascular status: blood pressure returned to baseline and stable Postop Assessment: no apparent nausea or vomiting Anesthetic complications: no   No notable events documented.  Last Vitals:  Vitals:   02/19/22 0500 02/19/22 0716  BP:  111/63  Pulse:    Resp:    Temp: 36.7 C 36.6 C  SpO2:      Last Pain:  Vitals:   02/19/22 1203  TempSrc:   PainSc: 10-Worst pain ever                 Cruz Devilla

## 2022-02-19 NOTE — Progress Notes (Signed)
Orthopedic Tech Progress Note Patient Details:  Christopher [name] November 09, 1955 [id number]  CPM Right Knee CPM Right Knee: Off Right Knee Flexion (Degrees): 0 Right Knee Extension (Degrees): 50 Dropped CPM off in room at 7pm. Pt didn't want [place] to put him in it because he was eating at the time.    Edwina Barth 02/19/2022, 5:44 AM

## 2022-02-19 NOTE — Progress Notes (Signed)
  Subjective: Christopher [name] is a 66 y.o. male s/p right patellar resurfacing following TKA one year ago.  They are POD1.  Pt's pain is controlled but moderate.  Pt denies numbness/tingling/weakness.  Pt has ambulated with some difficulty.  Able to ambulate 200 feet with PT. No CP, SOB, calf pain, fevers, chills  Objective: Vital signs in last 24 hours: Temp:  [97.4 F (36.3 C)-99 F (37.2 C)] 97.9 F (36.6 C) (06/21 0716) Pulse Rate:  [50-58] 50 (06/21 0450) Resp:  [12-18] 17 (06/21 0450) BP: (100-123)/(52-68) 111/63 (06/21 0716) SpO2:  [92 %-100 %] 94 % (06/21 0450)  Intake/Output from previous day: 06/20 0701 - 06/21 0700 In: 1240 [P.O.:240; I.V.:1000] Out: 1030 [Urine:1000; Blood:30] Intake/Output this shift: No intake/output data recorded.  Exam:  No gross blood or drainage overlying the dressing 2+ DP pulse Sensation intact distally in the right foot Able to dorsiflex and plantarflex the right foot Able to perform straight leg raise with no extensor lag No calf tenderness, negative homan sign   Labs: Recent Labs    02/19/22 0756  HGB 12.9*   Recent Labs    02/19/22 0756  WBC 16.2*  RBC 4.59  HCT 37.9*  PLT 243   No results for input(s): "NA", "K", "CL", "CO2", "BUN", "CREATININE", "GLUCOSE", "CALCIUM" in the last 72 hours. No results for input(s): "LABPT", "INR" in the last 72 hours.  Assessment/Plan: Pt is POD1 s/p right patellar resurfacing.    -Plan to discharge to home today  -WBAT with a walker  -Follow-up with Dr Marlou Sa in clinic in 2 weeks  Donella Stade 02/19/2022, 12:14 PM

## 2022-02-23 LAB — AEROBIC/ANAEROBIC CULTURE W GRAM STAIN (SURGICAL/DEEP WOUND)
Culture: NO GROWTH
Culture: NO GROWTH
Culture: NO GROWTH
Gram Stain: NONE SEEN
Gram Stain: NONE SEEN
Gram Stain: NONE SEEN

## 2022-02-26 NOTE — Discharge Summary (Signed)
Physician Discharge Summary      Patient ID: Christopher [name] MRN: [id number] DOB/AGE: [birth year] 66 y.o.  Admit date: 02/18/2022 Discharge date: 02/19/2022  Admission Diagnoses:  Principal Problem:   Knee pain Active Problems:   S/P right knee surgery   Discharge Diagnoses:  Same  Surgeries: Procedure(s): RIGHT KNEE PATELLA REPLACEMENT, CEMENTED on 02/18/2022   Consultants:   Discharged Condition: Stable  Hospital Course: Christopher [name] is an 66 y.o. male who was admitted 02/18/2022 with a chief complaint of right knee pain, and found to have a diagnosis of right knee nonsurfaced patella.  They were brought to the operating room on 02/18/2022 and underwent the above named procedures.  Pt awoke from anesthesia without complication and was transferred to the floor. On POD1, patient's pain is well controlled and he is able to mobilize well with PT.  Vital signs are stable.  He was discharged home on POD 1..  Pt will f/u with Dr. Marlou Sa in clinic in ~2 weeks.   Antibiotics given:  Anti-infectives (From admission, onward)    Start     Dose/Rate Route Frequency Ordered Stop   02/18/22 2200  ceFAZolin (ANCEF) IVPB 2g/100 mL premix        2 g 200 mL/hr over 30 Minutes Intravenous Every 8 hours 02/18/22 1805 02/19/22 0558   02/18/22 1518  vancomycin (VANCOCIN) powder  Status:  Discontinued          As needed 02/18/22 1518 02/18/22 1629   02/18/22 1015  ceFAZolin (ANCEF) IVPB 2g/100 mL premix        2 g 200 mL/hr over 30 Minutes Intravenous On call to O.R. 02/18/22 1009 02/18/22 1421     .  Recent vital signs:  Vitals:   02/19/22 0500 02/19/22 0716  BP:  111/63  Pulse:    Resp:    Temp: 98 F (36.7 C) 97.9 F (36.6 C)  SpO2:      Recent laboratory studies:  Results for orders placed or performed during the hospital encounter of 02/18/22  Aerobic/Anaerobic Culture w Gram Stain (surgical/deep wound)   Specimen: Wound; Tissue  Result Value Ref Range   Specimen Description  WOUND    Special Requests RIGHT KNEE DEEP SPEC C    Gram Stain NO WBC SEEN NO ORGANISMS SEEN     Culture      No growth aerobically or anaerobically. Performed at [facility], [place] [address], [place], [place] [address]    Report Status 02/23/2022 FINAL   Aerobic/Anaerobic Culture w Gram Stain (surgical/deep wound)   Specimen: Wound; Tissue  Result Value Ref Range   Specimen Description WOUND    Special Requests RIGHT KNEE DEEP SPEC A    Gram Stain NO WBC SEEN NO ORGANISMS SEEN     Culture      No growth aerobically or anaerobically. Performed at [facility], [place] [address] [place], [place] [address]    Report Status 02/23/2022 FINAL   Aerobic/Anaerobic Culture w Gram Stain (surgical/deep wound)   Specimen: Wound; Tissue  Result Value Ref Range   Specimen Description WOUND    Special Requests RIGHT DEEP SPEC B    Gram Stain NO WBC SEEN NO ORGANISMS SEEN     Culture      No growth aerobically or anaerobically. Performed at [facility], [place] [address] [place], [place] [address]    Report Status 02/23/2022 FINAL   CBC  Result Value Ref Range   WBC  16.2 (H) 4.0 - 10.5 K/uL   RBC 4.59 4.22 - 5.81 MIL/uL   Hemoglobin 12.9 (L) 13.0 - 17.0 g/dL   HCT 37.9 (L) 39.0 - 52.0 %   MCV 82.6 80.0 - 100.0 fL   MCH 28.1 26.0 - 34.0 pg   MCHC 34.0 30.0 - 36.0 g/dL   RDW 14.5 11.5 - 15.5 %   Platelets 243 150 - 400 K/uL   nRBC 0.0 0.0 - 0.2 %    Discharge Medications:   Allergies as of 02/19/2022       Reactions   Celebrex [celecoxib] Itching, Swelling, Other (See Comments)   All over   Cortisone Swelling   SWELLING REACTION UNSPECIFIED    Doxycycline Swelling, Other (See Comments)   Made tongue turn black    Prednisone Swelling   SWELLING REACTION UNSPECIFIED    Relafen [nabumetone] Swelling   SWELLING REACTION UNSPECIFIED    Codeine Nausea And Vomiting, Other (See Comments)   Extreme stomach pain. This includes anything with the derivative of  codeine in it. (Does tolerate hydrocodone)    Aspirin Itching   Oxycodone-acetaminophen Itching, Other (See Comments)   Can tolerate with benadryl         Medication List     STOP taking these medications    HYDROcodone-acetaminophen 5-325 MG tablet Commonly known as: NORCO/VICODIN       TAKE these medications    acetaminophen 500 MG tablet Commonly known as: TYLENOL Take 500-1,000 mg by mouth every 6 (six) hours as needed for moderate pain.   aspirin 81 MG chewable tablet Commonly known as: Aspirin Childrens Chew 1 tablet (81 mg total) by mouth 2 (two) times daily. To prevent blood clots   EPINEPHrine 0.3 mg/0.3 mL Soaj injection Commonly known as: EPI-PEN Inject 0.3 mg into the muscle as needed for anaphylaxis.   ICY HOT EX Apply 1 application topically daily as needed (pain).   methocarbamol 500 MG tablet Commonly known as: ROBAXIN Take 1 tablet (500 mg total) by mouth every 6 (six) hours as needed for muscle spasms.   nitroGLYCERIN 0.4 MG SL tablet Commonly known as: NITROSTAT PLACE (1) TABLET UNDER TONGUE EVERY 5 MINUTES UP TO (3) DOSES. IF NO RELIEF CALL 911.   oxyCODONE-acetaminophen 5-325 MG tablet Commonly known as: Percocet Take 1 tablet by mouth every 4 (four) hours as needed for severe pain.   pantoprazole 40 MG tablet Commonly known as: PROTONIX TAKE (1) TABLET TWICE A DAY BEFORE MEALS. What changed: See the new instructions.   pregabalin 50 MG capsule Commonly known as: LYRICA Take 1 capsule (50 mg total) by mouth 2 (two) times daily. What changed:  when to take this reasons to take this   ProAir HFA 108 (90 Base) MCG/ACT inhaler Generic drug: albuterol Inhale 2 puffs into the lungs every 6 (six) hours as needed for wheezing or shortness of breath.   rosuvastatin 10 MG tablet Commonly known as: CRESTOR Take 10 mg by mouth daily.   sertraline 50 MG tablet Commonly known as: ZOLOFT Take 50 mg by mouth in the morning.         Diagnostic Studies: No results found.  Disposition: Discharge disposition: 01-Home or Self Care       Discharge Instructions     Call MD / Call 911   Complete by: As directed    If you experience chest pain or shortness of breath, CALL 911 and be transported to the hospital emergency room.  If you develope a fever  above 101 F, pus (white drainage) or increased drainage or redness at the wound, or calf pain, call your surgeon's office.   Constipation Prevention   Complete by: As directed    Drink plenty of fluids.  Prune juice may be helpful.  You may use a stool softener, such as Colace (over the counter) 100 mg twice a day.  Use MiraLax (over the counter) for constipation as needed.   Diet - low sodium heart healthy   Complete by: As directed    Discharge instructions   Complete by: As directed    You may shower, dressing is waterproof.  Do not remove the dressing, we will remove it at your first post-op appointment.  Do not take a bath or soak the knee in a tub or pool.  You may weightbear as you can tolerate on the operative leg with a walker.  Use the CPM machine 3 times per day for one hour each time, increasing the degrees of range of motion daily.  Use a pillow under your heel to work on getting your leg straight.  Do NOT put a pillow under your knee.  You will follow-up with Dr. Marlou Sa in the clinic in 2 weeks at your given appointment date.    INSTRUCTIONS AFTER JOINT REPLACEMENT   Remove items at home which could result in a fall. This includes throw rugs or furniture in walking pathways ICE to the affected joint every three hours while awake for 30 minutes at a time, for at least the first 3-5 days, and then as needed for pain and swelling.  Continue to use ice for pain and swelling. You may notice swelling that will progress down to the foot and ankle.  This is normal after surgery.  Elevate your leg when you are not up walking on it.   Continue to use the breathing machine  you got in the hospital (incentive spirometer) which will help keep your temperature down.  It is common for your temperature to cycle up and down following surgery, especially at night when you are not up moving around and exerting yourself.  The breathing machine keeps your lungs expanded and your temperature down.   DIET:  As you were doing prior to hospitalization, we recommend a well-balanced diet.  DRESSING / WOUND CARE / SHOWERING  Keep the surgical dressing until follow up.  The dressing is water proof, so you can shower without any extra covering.  IF THE DRESSING FALLS OFF or the wound gets wet inside, change the dressing with sterile gauze.  Please use good hand washing techniques before changing the dressing.  Do not use any lotions or creams on the incision until instructed by your surgeon.    ACTIVITY  Increase activity slowly as tolerated, but follow the weight bearing instructions below.   No driving for 6 weeks or until further direction given by your physician.  You cannot drive while taking narcotics.  No lifting or carrying greater than 10 lbs. until further directed by your surgeon. Avoid periods of inactivity such as sitting longer than an hour when not asleep. This helps prevent blood clots.  You may return to work once you are authorized by your doctor.     WEIGHT BEARING   Weight bearing as tolerated with assist device (walker, cane, etc) as directed, use it as long as suggested by your surgeon or therapist, typically at least 4-6 weeks.   EXERCISES  Results after joint replacement surgery are often greatly improved  when you follow the exercise, range of motion and muscle strengthening exercises prescribed by your doctor. Safety measures are also important to protect the joint from further injury. Any time any of these exercises cause you to have increased pain or swelling, decrease what you are doing until you are comfortable again and then slowly increase them. If  you have problems or questions, call your caregiver or physical therapist for advice.   Rehabilitation is important following a joint replacement. After just a few days of immobilization, the muscles of the leg can become weakened and shrink (atrophy).  These exercises are designed to build up the tone and strength of the thigh and leg muscles and to improve motion. Often times heat used for twenty to thirty minutes before working out will loosen up your tissues and help with improving the range of motion but do not use heat for the first two weeks following surgery (sometimes heat can increase post-operative swelling).   These exercises can be done on a training (exercise) mat, on the floor, on a table or on a bed. Use whatever works the best and is most comfortable for you.    Use music or television while you are exercising so that the exercises are a pleasant break in your day. This will make your life better with the exercises acting as a break in your routine that you can look forward to.   Perform all exercises about fifteen times, three times per day or as directed.  You should exercise both the operative leg and the other leg as well.  Exercises include:   Quad Sets - Tighten up the muscle on the front of the thigh (Quad) and hold for 5-10 seconds.   Straight Leg Raises - With your knee straight (if you were given a brace, keep it on), lift the leg to 60 degrees, hold for 3 seconds, and slowly lower the leg.  Perform this exercise against resistance later as your leg gets stronger.  Leg Slides: Lying on your back, slowly slide your foot toward your buttocks, bending your knee up off the floor (only go as far as is comfortable). Then slowly slide your foot back down until your leg is flat on the floor again.  Angel Wings: Lying on your back spread your legs to the side as far apart as you can without causing discomfort.  Hamstring Strength:  Lying on your back, push your heel against the floor  with your leg straight by tightening up the muscles of your buttocks.  Repeat, but this time bend your knee to a comfortable angle, and push your heel against the floor.  You may put a pillow under the heel to make it more comfortable if necessary.   A rehabilitation program following joint replacement surgery can speed recovery and prevent re-injury in the future due to weakened muscles. Contact your doctor or a physical therapist for more information on knee rehabilitation.    CONSTIPATION  Constipation is defined medically as fewer than three stools per week and severe constipation as less than one stool per week.  Even if you have a regular bowel pattern at home, your normal regimen is likely to be disrupted due to multiple reasons following surgery.  Combination of anesthesia, postoperative narcotics, change in appetite and fluid intake all can affect your bowels.   YOU MUST use at least one of the following options; they are listed in order of increasing strength to get the job done.  They are all  available over the counter, and you may need to use some, POSSIBLY even all of these options:    Drink plenty of fluids (prune juice may be helpful) and high fiber foods Colace 100 mg by mouth twice a day  Senokot for constipation as directed and as needed Dulcolax (bisacodyl), take with full glass of water  Miralax (polyethylene glycol) once or twice a day as needed.  If you have tried all these things and are unable to have a bowel movement in the first 3-4 days after surgery call either your surgeon or your primary doctor.    If you experience loose stools or diarrhea, hold the medications until you stool forms back up.  If your symptoms do not get better within 1 week or if they get worse, check with your doctor.  If you experience "the worst abdominal pain ever" or develop nausea or vomiting, please contact the office immediately for further recommendations for treatment.   ITCHING:  If you  experience itching with your medications, try taking only a single pain pill, or even half a pain pill at a time.  You can also use Benadryl over the counter for itching or also to help with sleep.   TED HOSE STOCKINGS:  Use stockings on both legs until for at least 2 weeks or as directed by physician office. They may be removed at night for sleeping.  MEDICATIONS:  See your medication summary on the "After Visit Summary" that nursing will review with you.  You may have some home medications which will be placed on hold until you complete the course of blood thinner medication.  It is important for you to complete the blood thinner medication as prescribed.  PRECAUTIONS:  If you experience chest pain or shortness of breath - call 911 immediately for transfer to the hospital emergency department.   If you develop a fever greater that 101 F, purulent drainage from wound, increased redness or drainage from wound, foul odor from the wound/dressing, or calf pain - CONTACT YOUR SURGEON.                                                   FOLLOW-UP APPOINTMENTS:  If you do not already have a post-op appointment, please call the office for an appointment to be seen by your surgeon.  Guidelines for how soon to be seen are listed in your "After Visit Summary", but are typically between 1-4 weeks after surgery.  OTHER INSTRUCTIONS:   Knee Replacement:  Do not place pillow under knee, focus on keeping the knee straight while resting. CPM instructions: 0-90 degrees, 2 hours in the morning, 2 hours in the afternoon, and 2 hours in the evening. Place foam block, curve side up under heel at all times except when in CPM or when walking.  DO NOT modify, tear, cut, or change the foam block in any way.  POST-OPERATIVE OPIOID TAPER INSTRUCTIONS: It is important to wean off of your opioid medication as soon as possible. If you do not need pain medication after your surgery it is ok to stop day one. Opioids  include: Codeine, Hydrocodone(Norco, Vicodin), Oxycodone(Percocet, oxycontin) and hydromorphone amongst others.  Long term and even short term use of opiods can cause: Increased pain response Dependence Constipation Depression Respiratory depression And more.  Withdrawal symptoms can include Flu like symptoms Nausea,  vomiting And more Techniques to manage these symptoms Hydrate well Eat regular healthy meals Stay active Use relaxation techniques(deep breathing, meditating, yoga) Do Not substitute Alcohol to help with tapering If you have been on opioids for less than two weeks and do not have pain than it is ok to stop all together.  Plan to wean off of opioids This plan should start within one week post op of your joint replacement. Maintain the same interval or time between taking each dose and first decrease the dose.  Cut the total daily intake of opioids by one tablet each day Next start to increase the time between doses. The last dose that should be eliminated is the evening dose.   MAKE SURE YOU:  Understand these instructions.  Get help right away if you are not doing well or get worse.    Thank you for letting us be a part of your medical care team.  It is a privilege we respect greatly.  We hope these instructions will help you stay on track for a fast and full recovery!    Dental Antibiotics:  In most cases prophylactic antibiotics for Dental procdeures after total joint surgery are not necessary.  Exceptions are as follows:  1. History of prior total joint infection  2. Severely immunocompromised (Organ Transplant, cancer chemotherapy, Rheumatoid biologic meds such as [place])  3. Poorly controlled diabetes (A1C &gt; 8.0, blood glucose over 200)  If you have one of these conditions, contact your surgeon for an antibiotic prescription, prior to your dental procedure.   Increase activity slowly as tolerated   Complete by: As directed    Post-operative  opioid taper instructions:   Complete by: As directed    POST-OPERATIVE OPIOID TAPER INSTRUCTIONS: It is important to wean off of your opioid medication as soon as possible. If you do not need pain medication after your surgery it is ok to stop day one. Opioids include: Codeine, Hydrocodone(Norco, Vicodin), Oxycodone(Percocet, oxycontin) and hydromorphone amongst others.  Long term and even short term use of opiods can cause: Increased pain response Dependence Constipation Depression Respiratory depression And more.  Withdrawal symptoms can include Flu like symptoms Nausea, vomiting And more Techniques to manage these symptoms Hydrate well Eat regular healthy meals Stay active Use relaxation techniques(deep breathing, meditating, yoga) Do Not substitute Alcohol to help with tapering If you have been on opioids for less than two weeks and do not have pain than it is ok to stop all together.  Plan to wean off of opioids This plan should start within one week post op of your joint replacement. Maintain the same interval or time between taking each dose and first decrease the dose.  Cut the total daily intake of opioids by one tablet each day Next start to increase the time between doses. The last dose that should be eliminated is the evening dose.           Follow-up Information     Leeanne Rio, MD Follow up.   Specialty: Family Medicine Contact information: [place] [id number] [place] Follow up.   Why: [facility] will provide you with your home health PT services, start of care within 48 hours post discharge                 Signed: Donella Stade 02/26/2022, 8:05 PM

## 2022-03-05 ENCOUNTER — Encounter: Payer: Self-pay | Admitting: Orthopedic Surgery

## 2022-03-05 ENCOUNTER — Ambulatory Visit (INDEPENDENT_AMBULATORY_CARE_PROVIDER_SITE_OTHER): Payer: PPO

## 2022-03-05 ENCOUNTER — Ambulatory Visit (INDEPENDENT_AMBULATORY_CARE_PROVIDER_SITE_OTHER): Payer: PPO | Admitting: Orthopedic Surgery

## 2022-03-05 DIAGNOSIS — Z96651 Presence of right artificial knee joint: Secondary | ICD-10-CM | POA: Diagnosis not present

## 2022-03-05 MED ORDER — OXYCODONE-ACETAMINOPHEN 5-325 MG PO TABS: 1.0000 | ORAL_TABLET | Freq: Three times a day (TID) | ORAL | 0 refills | Status: DC | PRN

## 2022-03-05 NOTE — Progress Notes (Signed)
Post-Op Visit Note   Patient: Christopher [name]           Date of Birth: September 20, 1955           MRN: [id number] Visit Date: 03/05/2022 PCP: Leeanne Rio, MD   Assessment & Plan:  Chief Complaint:  Chief Complaint  Patient presents with   Right Knee - Routine Post Op   Visit Diagnoses:  1. S/P TKR (total knee replacement), right     Plan: Emrah is a 66 year old patient is now 2 weeks out patellar resurfacing right knee.  Overall he states he is doing well.  Pain has significantly improved compared to preoperative status.  On exam he has flexion to 100 degrees.  No calf tenderness negative Homans.  Incision intact.  Plan at this time is to continue with range of motion and strengthening as tolerated.  He took a walk 2 days ago which was a little bit too long and his knee hurt after that but that has since improved.  Refill oxycodone 1 time only and then proceed with anti-inflammatories and Tylenol for pain.  Come back in 4 weeks for final check and release.  Follow-Up Instructions: Return in about 4 weeks (around 04/02/2022).   Orders:  Orders Placed This Encounter  Procedures   XR Knee 1-2 Views Right   No orders of the defined types were placed in this encounter.   Imaging: XR Knee 1-2 Views Right  Result Date: 03/05/2022 [facility] lateral radiographs right knee reviewed.  Total knee prosthesis in good position alignment with no complicating features.  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[place])    Primary osteoarthritis, left shoulder    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   History of colonic polyps 11/26/2017   S/P total knee replacement, left 06/16/17 06/16/2017   Primary osteoarthritis of left knee    Hyperlipidemia 09/04/2016   Angina pectoris ([place]) 09/04/2016   OSA (obstructive sleep apnea) 09/04/2016   COPD (chronic obstructive pulmonary disease) ([place]) 09/04/2016   Rotator cuff tear 05/05/2014   S/P shoulder surgery 09/26/2013   Arthritis, shoulder region 09/26/2013   Bursitis, shoulder 09/26/2013   Synovitis of shoulder 09/26/2013   Labral tear of shoulder, degenerative 09/26/2013   Biceps tendon tear 09/26/2013   Rotator cuff syndrome of left shoulder 06/21/2013   Arthritis 06/21/2013   Patellar tendinitis 03/29/2013   Effusion of knee joint 03/29/2013   Bursitis/tendonitis, shoulder 03/10/2013   Effusion of knee joint, left 08/18/2011   Knee pain 08/18/2011   Acute torn meniscus 07/30/2011   Old torn meniscus of knee 07/30/2011   ARTHRITIS, LEFT KNEE 10/15/2010   MEDIAL MENISCUS TEAR, RIGHT 10/15/2010   HIP PAIN 01/15/2010   DEGENERATIVE DISC DISEASE, LUMBOSACRAL SPINE W/RADICULOPATHY 62/56/3893   PLICA SYNDROME 73/42/8768   DERANGEMENT MENISCUS 07/09/2009   JOINT EFFUSION, LEFT KNEE 07/09/2009   Unilateral primary osteoarthritis, left knee 01/30/2009   KNEE PAIN 01/30/2009   ANKLE SPRAIN, RIGHT 11/08/2007   Past Medical History:  Diagnosis  Date   Anxiety    Arthritis    Asthma    BPH (benign prostatic hyperplasia)    Complication of anesthesia    pt had a hard time being able to move after spinal anesthesia , 3-4 hours   Depression    GERD (gastroesophageal reflux disease)    Gout    no meds   Headache(784.0)    otc meds prn   Heart murmur    dx as a child, no problems as an adult   Hyperlipidemia    IBS (irritable bowel syndrome)    PONV (postoperative nausea and vomiting)    Pre-diabetes    Borderline, diet and  exercise, no med   Sleep apnea    uses CIPAP machine at night   Wears partial dentures    bottom partial    Family History  Problem Relation Age of Onset   Alzheimer's disease Father    Diabetes Father    Diabetes Other    Lung disease Other    Arthritis Other    Anesthesia problems Neg Hx    Hypotension Neg Hx    Malignant hyperthermia Neg Hx    Pseudochol deficiency Neg Hx    Sleep apnea Neg Hx     Past Surgical History:  Procedure Laterality Date   APPENDECTOMY     BACK SURGERY  2002   neck and back fusion   BIOPSY  12/27/2015   Procedure: BIOPSY;  Surgeon: Rogene Houston, MD;  [facility];  Service: Endoscopy;;  Fundus biopsies and duodenal biopsies   BIOPSY  02/10/2020   Procedure: BIOPSY;  Surgeon: Rogene Houston, MD;  [facility];  Service: Endoscopy;;  antral   CARDIAC CATHETERIZATION     CARDIAC CATHETERIZATION N/A 09/04/2016   Procedure: Right/Left Heart Cath and Coronary Angiography;  Surgeon: Peter M Martinique, MD;  Location: [place] [facility];  Service: Cardiovascular;  Laterality: N/A;   CHOLECYSTECTOMY     CHONDROPLASTY  08/15/2011   Procedure: CHONDROPLASTY;  Surgeon: [name], MD;  Location: [facility];  Service: Orthopedics;  Laterality: Left;   COLONOSCOPY  06/27/2011   Procedure: COLONOSCOPY;  Surgeon: Rogene Houston, MD;  [facility];  Service: Endoscopy;  Laterality: N/A;  9:00 / Pt to be here at 9am for 10:45 procedure, benign polyps removed   COLONOSCOPY N/A 10/05/2014   Procedure: COLONOSCOPY;  Surgeon: Rogene Houston, MD;  [facility];  Service: Endoscopy;  Laterality: N/A;  930   COLONOSCOPY N/A 02/11/2018   Procedure: COLONOSCOPY;  Surgeon: Rogene Houston, MD;  [facility];  Service: Endoscopy;  Laterality: N/A;  830   ESOPHAGOGASTRODUODENOSCOPY N/A 12/27/2015   Procedure: ESOPHAGOGASTRODUODENOSCOPY (EGD);  Surgeon: Rogene Houston, MD;  [facility];  Service:  Endoscopy;  Laterality: N/A;  3:00   ESOPHAGOGASTRODUODENOSCOPY (EGD) WITH PROPOFOL N/A 02/10/2020   Procedure: ESOPHAGOGASTRODUODENOSCOPY (EGD) WITH PROPOFOL;  Surgeon: Rogene Houston, MD;  [facility];  Service: Endoscopy;  Laterality: N/A;  155   HERNIA REPAIR     umbilical hernia   JOINT REPLACEMENT     knee and shoulder   KNEE ARTHROSCOPY     left knee   KNEE ARTHROSCOPY     right knee    LUMBAR LAMINECTOMY/DECOMPRESSION MICRODISCECTOMY  09/14/2012   Procedure: LUMBAR LAMINECTOMY/DECOMPRESSION MICRODISCECTOMY 1 LEVEL;  Surgeon: Floyce Stakes, MD;  Location: [place] [facility];  Service: Neurosurgery;  Laterality: Right;  Right Lumbar  three-four Diskectomy   neck fusion  2005   PATELLA-FEMORAL ARTHROPLASTY Right 02/18/2022   Procedure: RIGHT KNEE PATELLA REPLACEMENT, CEMENTED;  Surgeon: Meredith Pel, MD;  Location: [place];  Service: Orthopedics;  Laterality: Right;   POLYPECTOMY  02/11/2018   Procedure: POLYPECTOMY;  Surgeon: Rogene Houston, MD;  [facility];  Service: Endoscopy;;  colon   REVISION TOTAL SHOULDER TO REVERSE TOTAL SHOULDER Left 12/16/2018   Procedure: REVISION TOTAL SHOULDER TO REVERSE TOTAL SHOULDER;  Surgeon: Meredith Pel, MD;  Location: [place];  Service: Orthopedics;  Laterality: Left;   SHOULDER ARTHROSCOPY WITH BICEPSTENOTOMY Left 09/23/2013   Procedure: SHOULDER ARTHROSCOPY WITH BICEPSTENOTOMY AND EXTENSIVE DEBRIDEMENT;  Surgeon: Carole Civil, MD;  [facility];  Service: Orthopedics;  Laterality: Left;   SHOULDER ARTHROSCOPY WITH ROTATOR CUFF REPAIR Left 05/05/2014   Procedure: SHOULDER ARTHROSCOPY LIMITED DEBRIDEMENT;  Surgeon: Carole Civil, MD;  [facility];  Service: Orthopedics;  Laterality: Left;   SHOULDER OPEN ROTATOR CUFF REPAIR Left 05/05/2014   Procedure: ROTATOR CUFF REPAIR SHOULDER OPEN;  Surgeon: Carole Civil, MD;  [facility];  Service: Orthopedics;  Laterality: Left;   SHOULDER OPEN  ROTATOR CUFF REPAIR Left 12/16/2018   Procedure: LEFT SHOULDER POSSIBLE SUBSCAPULARIS REPAIR VS. REVISION TO REVERSE TOTAL SHOULDER REPLACEMENT;  Surgeon: Meredith Pel, MD;  Location: [place];  Service: Orthopedics;  Laterality: Left;   SHOULDER SURGERY Right    Open Mumford procedure   SPINAL FUSION     x2, 2003 and 2007   TOTAL KNEE ARTHROPLASTY Left 06/16/2017   Procedure: LEFT TOTAL KNEE ARTHROPLASTY;  Surgeon: Carole Civil, MD;  [facility];  Service: Orthopedics;  Laterality: Left;   TOTAL KNEE ARTHROPLASTY Right 05/14/2021   Procedure: TOTAL KNEE ARTHROPLASTY;  Surgeon: Carole Civil, MD;  [facility];  Service: Orthopedics;  Laterality: Right;   TOTAL SHOULDER ARTHROPLASTY Left 08/19/2018   Procedure: left shoulder replacement;  Surgeon: Meredith Pel, MD;  Location: [place];  Service: Orthopedics;  Laterality: Left;   Social History   Occupational History   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 since age 20  Vaping Use   Vaping Use: Never used  Substance and Sexual Activity   Alcohol use: No    Alcohol/week: 0.0 standard drinks of alcohol   Drug use: No   Sexual activity: Not Currently

## 2022-03-05 NOTE — Addendum Note (Signed)
Addended by: Marcene Duos on: 03/05/2022 02:38 PM   Modules accepted: Orders

## 2022-03-06 ENCOUNTER — Encounter: Payer: PPO | Admitting: Orthopedic Surgery

## 2022-04-03 ENCOUNTER — Encounter: Payer: Self-pay | Admitting: Orthopedic Surgery

## 2022-04-03 ENCOUNTER — Ambulatory Visit (INDEPENDENT_AMBULATORY_CARE_PROVIDER_SITE_OTHER): Payer: PPO | Admitting: Surgical

## 2022-04-03 DIAGNOSIS — G8918 Other acute postprocedural pain: Secondary | ICD-10-CM

## 2022-04-04 MED ORDER — OXYCODONE-ACETAMINOPHEN 5-325 MG PO TABS: 1.0000 | ORAL_TABLET | Freq: Every evening | ORAL | 0 refills | Status: DC | PRN

## 2022-04-04 MED ORDER — METHOCARBAMOL 500 MG PO TABS: 500.0000 mg | ORAL_TABLET | Freq: Four times a day (QID) | ORAL | 5 refills | Status: DC | PRN

## 2022-04-06 ENCOUNTER — Encounter: Payer: Self-pay | Admitting: Orthopedic Surgery

## 2022-04-06 NOTE — Progress Notes (Signed)
Post-Op Visit Note   Patient: Christopher [name]           Date of Birth: 09-07-1955           MRN: [id number] Visit Date: 04/03/2022 PCP: Leeanne Rio, MD   Assessment & Plan:  Chief Complaint:  Chief Complaint  Patient presents with   Right Knee - Routine Post Op   Visit Diagnoses:  1. Post-op pain     Plan: Patient is a 66 year old male who presents s/p right knee patellar resurfacing on 02/18/2022.  He states that he continues to improve steadily.  Taking Robaxin and Lyrica as well as occasional oxycodone for pain control.  Denies any fevers or chills.  Really does not have any significant pain except for at night.  Notes soreness and swelling that is based on his activity level.  He is keeping active by doing a lot of walking and by mowing lawns.  On exam, incision is well-healed.  Small effusion is noted.  No calf tenderness.  Negative Homans' sign.  Patient is able to perform straight leg raise with excellent quad strength.  0 degrees extension and 115 degrees of knee flexion.  Plan to refill oxycodone for 1 last prescription just to take at night as needed.  Follow-up for final check in 6 weeks.  Follow-Up Instructions: No follow-ups on file.   Orders:  No orders of the defined types were placed in this encounter.  Meds ordered this encounter  Medications   methocarbamol (ROBAXIN) 500 MG tablet    Sig: Take 1 tablet (500 mg total) by mouth every 6 (six) hours as needed for muscle spasms.    Dispense:  60 tablet    Refill:  5   oxyCODONE-acetaminophen (PERCOCET) 5-325 MG tablet    Sig: Take 1 tablet by mouth at bedtime as needed for severe pain.    Dispense:  20 tablet    Refill:  0    Imaging: No results found.  PMFS History: Patient Active Problem List   Diagnosis Date Noted   S/P right knee surgery 02/18/2022   S/P TKR (total knee replacement), right 05/14/21 07/02/2021   Status post total right knee replacement 05/14/2021   Primary osteoarthritis of right  knee    Renal cyst 10/03/2020   Frequency of micturition 10/03/2020   Benign prostatic hyperplasia with urinary obstruction 10/03/2020   IBS (irritable bowel syndrome) 07/05/2020   Lumbar disc herniation 06/29/2019   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[place])    Primary osteoarthritis, left shoulder    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   History of colonic polyps 11/26/2017   S/P total knee replacement, left 06/16/17 06/16/2017   Primary osteoarthritis of left knee    Hyperlipidemia 09/04/2016   Angina pectoris ([place]) 09/04/2016   OSA (obstructive sleep apnea) 09/04/2016   COPD (chronic obstructive pulmonary disease) ([place]) 09/04/2016   Rotator cuff tear 05/05/2014   S/P shoulder surgery 09/26/2013   Arthritis, shoulder region 09/26/2013   Bursitis, shoulder 09/26/2013   Synovitis of shoulder 09/26/2013   Labral tear of shoulder, degenerative 09/26/2013   Biceps tendon tear 09/26/2013   Rotator cuff syndrome of left shoulder 06/21/2013   Arthritis 06/21/2013   Patellofemoral arthritis of right knee 03/29/2013   Effusion of knee joint 03/29/2013   Bursitis/tendonitis, shoulder 03/10/2013   Effusion of knee joint, left 08/18/2011   Knee pain 08/18/2011   Acute torn meniscus 07/30/2011   Old torn meniscus of knee 07/30/2011   ARTHRITIS, LEFT KNEE 10/15/2010  MEDIAL MENISCUS TEAR, RIGHT 10/15/2010   HIP PAIN 01/15/2010   DEGENERATIVE DISC DISEASE, LUMBOSACRAL SPINE W/RADICULOPATHY 79/89/2119   PLICA SYNDROME 41/74/0814   DERANGEMENT MENISCUS 07/09/2009   JOINT EFFUSION, LEFT KNEE 07/09/2009   Unilateral primary osteoarthritis, left knee 01/30/2009   KNEE PAIN 01/30/2009   ANKLE SPRAIN, RIGHT 11/08/2007   Past Medical History:  Diagnosis Date   Anxiety    Arthritis    Asthma    BPH (benign prostatic hyperplasia)    Complication of anesthesia    pt had a hard time being able to move after spinal anesthesia , 3-4 hours   Depression    GERD (gastroesophageal reflux disease)     Gout    no meds   Headache(784.0)    otc meds prn   Heart murmur    dx as a child, no problems as an adult   Hyperlipidemia    IBS (irritable bowel syndrome)    PONV (postoperative nausea and vomiting)    Pre-diabetes    Borderline, diet and exercise, no med   Sleep apnea    uses CIPAP machine at night   Wears partial dentures    bottom partial    Family History  Problem Relation Age of Onset   Alzheimer's disease Father    Diabetes Father    Diabetes Other    Lung disease Other    Arthritis Other    Anesthesia problems Neg Hx    Hypotension Neg Hx    Malignant hyperthermia Neg Hx    Pseudochol deficiency Neg Hx    Sleep apnea Neg Hx     Past Surgical History:  Procedure Laterality Date   APPENDECTOMY     BACK SURGERY  2002   neck and back fusion   BIOPSY  12/27/2015   Procedure: BIOPSY;  Surgeon: Rogene Houston, MD;  [facility];  Service: Endoscopy;;  Fundus biopsies and duodenal biopsies   BIOPSY  02/10/2020   Procedure: BIOPSY;  Surgeon: Rogene Houston, MD;  [facility];  Service: Endoscopy;;  antral   CARDIAC CATHETERIZATION     CARDIAC CATHETERIZATION N/A 09/04/2016   Procedure: Right/Left Heart Cath and Coronary Angiography;  Surgeon: Peter M Martinique, MD;  Location: [place] [facility];  Service: Cardiovascular;  Laterality: N/A;   CHOLECYSTECTOMY     CHONDROPLASTY  08/15/2011   Procedure: CHONDROPLASTY;  Surgeon: [name], MD;  [facility];  Service: Orthopedics;  Laterality: Left;   COLONOSCOPY  06/27/2011   Procedure: COLONOSCOPY;  Surgeon: Rogene Houston, MD;  [facility];  Service: Endoscopy;  Laterality: N/A;  9:00 / Pt to be here at 9am for 10:45 procedure, benign polyps removed   COLONOSCOPY N/A 10/05/2014   Procedure: COLONOSCOPY;  Surgeon: Rogene Houston, MD;  [facility];  Service: Endoscopy;  Laterality: N/A;  930   COLONOSCOPY N/A 02/11/2018   Procedure: COLONOSCOPY;  Surgeon:  Rogene Houston, MD;  [facility];  Service: Endoscopy;  Laterality: N/A;  830   ESOPHAGOGASTRODUODENOSCOPY N/A 12/27/2015   Procedure: ESOPHAGOGASTRODUODENOSCOPY (EGD);  Surgeon: Rogene Houston, MD;  [facility];  Service: Endoscopy;  Laterality: N/A;  3:00   ESOPHAGOGASTRODUODENOSCOPY (EGD) WITH PROPOFOL N/A 02/10/2020   Procedure: ESOPHAGOGASTRODUODENOSCOPY (EGD) WITH PROPOFOL;  Surgeon: Rogene Houston, MD;  [facility];  Service: Endoscopy;  Laterality: N/A;  155   HERNIA REPAIR     umbilical hernia   JOINT REPLACEMENT  knee and shoulder   KNEE ARTHROSCOPY     left knee   KNEE ARTHROSCOPY     right knee    LUMBAR LAMINECTOMY/DECOMPRESSION MICRODISCECTOMY  09/14/2012   Procedure: LUMBAR LAMINECTOMY/DECOMPRESSION MICRODISCECTOMY 1 LEVEL;  Surgeon: Floyce Stakes, MD;  Location: [place] [facility];  Service: Neurosurgery;  Laterality: Right;  Right Lumbar three-four Diskectomy   neck fusion  2005   PATELLA-FEMORAL ARTHROPLASTY Right 02/18/2022   Procedure: RIGHT KNEE PATELLA REPLACEMENT, CEMENTED;  Surgeon: Meredith Pel, MD;  Location: [place];  Service: Orthopedics;  Laterality: Right;   POLYPECTOMY  02/11/2018   Procedure: POLYPECTOMY;  Surgeon: Rogene Houston, MD;  [facility];  Service: Endoscopy;;  colon   REVISION TOTAL SHOULDER TO REVERSE TOTAL SHOULDER Left 12/16/2018   Procedure: REVISION TOTAL SHOULDER TO REVERSE TOTAL SHOULDER;  Surgeon: Meredith Pel, MD;  Location: [place];  Service: Orthopedics;  Laterality: Left;   SHOULDER ARTHROSCOPY WITH BICEPSTENOTOMY Left 09/23/2013   Procedure: SHOULDER ARTHROSCOPY WITH BICEPSTENOTOMY AND EXTENSIVE DEBRIDEMENT;  Surgeon: Carole Civil, MD;  [facility];  Service: Orthopedics;  Laterality: Left;   SHOULDER ARTHROSCOPY WITH ROTATOR CUFF REPAIR Left 05/05/2014   Procedure: SHOULDER ARTHROSCOPY LIMITED DEBRIDEMENT;  Surgeon: Carole Civil, MD;  [facility];   Service: Orthopedics;  Laterality: Left;   SHOULDER OPEN ROTATOR CUFF REPAIR Left 05/05/2014   Procedure: ROTATOR CUFF REPAIR SHOULDER OPEN;  Surgeon: Carole Civil, MD;  [facility];  Service: Orthopedics;  Laterality: Left;   SHOULDER OPEN ROTATOR CUFF REPAIR Left 12/16/2018   Procedure: LEFT SHOULDER POSSIBLE SUBSCAPULARIS REPAIR VS. REVISION TO REVERSE TOTAL SHOULDER REPLACEMENT;  Surgeon: Meredith Pel, MD;  Location: [place];  Service: Orthopedics;  Laterality: Left;   SHOULDER SURGERY Right    Open Mumford procedure   SPINAL FUSION     x2, 2003 and 2007   TOTAL KNEE ARTHROPLASTY Left 06/16/2017   Procedure: LEFT TOTAL KNEE ARTHROPLASTY;  Surgeon: Carole Civil, MD;  [facility];  Service: Orthopedics;  Laterality: Left;   TOTAL KNEE ARTHROPLASTY Right 05/14/2021   Procedure: TOTAL KNEE ARTHROPLASTY;  Surgeon: Carole Civil, MD;  [facility];  Service: Orthopedics;  Laterality: Right;   TOTAL SHOULDER ARTHROPLASTY Left 08/19/2018   Procedure: left shoulder replacement;  Surgeon: Meredith Pel, MD;  Location: [place];  Service: Orthopedics;  Laterality: Left;   Social History   Occupational History   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 since age 74  Vaping Use   Vaping Use: Never used  Substance and Sexual Activity   Alcohol use: No    Alcohol/week: 0.0 standard drinks of alcohol   Drug use: No   Sexual activity: Not Currently

## 2022-04-21 ENCOUNTER — Telehealth: Payer: Self-pay | Admitting: Orthopedic Surgery

## 2022-04-21 NOTE — Telephone Encounter (Signed)
Received vm from pts wife stating needs copy of OP note on Right knee. IC,lmvm advised patient will need to sign authorization and once that is received, I'll call when ready. [phone]

## 2022-05-13 ENCOUNTER — Telehealth: Payer: Self-pay | Admitting: Orthopedic Surgery

## 2022-05-13 NOTE — Telephone Encounter (Signed)
Received vm from pts wife, Zelphia Cairo. Stating patient would like to get a copy of records and to pickup up at his upcoming appt on 9/14. IC, spoke with patient advised him I will have copy of his records ready and can sign auth when comes in.

## 2022-05-15 ENCOUNTER — Ambulatory Visit (INDEPENDENT_AMBULATORY_CARE_PROVIDER_SITE_OTHER): Payer: PPO

## 2022-05-15 ENCOUNTER — Encounter: Payer: Self-pay | Admitting: Surgical

## 2022-05-15 ENCOUNTER — Ambulatory Visit (INDEPENDENT_AMBULATORY_CARE_PROVIDER_SITE_OTHER): Payer: PPO | Admitting: Surgical

## 2022-05-15 DIAGNOSIS — M79604 Pain in right leg: Secondary | ICD-10-CM

## 2022-05-15 MED ORDER — CYCLOBENZAPRINE HCL 10 MG PO TABS: 10.0000 mg | ORAL_TABLET | Freq: Three times a day (TID) | ORAL | 1 refills | Status: DC | PRN

## 2022-05-15 MED ORDER — GABAPENTIN 100 MG PO CAPS: 100.0000 mg | ORAL_CAPSULE | Freq: Three times a day (TID) | ORAL | 2 refills | Status: DC | PRN

## 2022-05-15 NOTE — Progress Notes (Signed)
Post-Op Visit Note   Patient: Christopher [name]           Date of Birth: 12/18/1955           MRN: [id number] Visit Date: 05/15/2022 PCP: Leeanne Rio, MD   Assessment & Plan:  Chief Complaint:  Chief Complaint  Patient presents with   Right Knee - Follow-up    right knee patellar resurfacing on 02/18/2022   Visit Diagnoses:  1. Pain in right leg     Plan: Patient is a 66 year old male who presents for repeat evaluation s/p right knee patellar resurfacing on 02/18/2022.  Doing okay.  States that he is okay in the mornings but has difficulty in the afternoon with increased pain and swelling.  Denies any fevers or chills or any night sweats.  No real change in how his incision looks or any drainage from the incision.  He notes maybe a little bit of improvement compared with last visit.  He works [employer] care for about 3 to 4 hours/day.  Even when he had a period of about 4 days off in a row, he still continues to have fairly persistent pain.  Describes pain with light touch over the anterior aspect of the knee and notes a stabbing sensation at times.  Difficult to sleep at night and his knee is very sensitive.  Ortho exam demonstrates right knee with moderate effusion.  No significant warmth compared to contralateral knee.  Incision is well-healed without evidence of infection or dehiscence.  There is no sinus tract noted.  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  This is slightly tender.  Has positive Tinel sign overlying the midportion of the incision overlying the patella.  Very sensitive to light touch over this area as well.  Mild pain with hip range of motion.  No calf tenderness.  Negative Homans' sign.  Excellent quad strength rated 5/5.  Range of motion from 0 degrees extension to about 120 degrees of knee flexion.  Patient has some persistent pain following patellar resurfacing after his initial total knee arthroplasty.  He is just shy of 3  months out from most recent procedure.  He does have a fair amount of fluid in the knee.  A lot of his symptoms seem more related to typical stiffness and pain after total knee arthroplasty incision.  Also may have some contribution of pain from some nerve irritation in the area given the Tinel sign present on exam today and the pain out of proportion to light touch.  Think that this should slowly improve with time but may take another 6 to 9 months.  Does not really look like it is infected and radiographs taken today demonstrate no change in the position of the implant or any significant hip arthritis that may contribute to some knee pain.  Plan is to check back in 1 month for evaluation by Dr. Marlou Sa and he would like Dr. Marlou Sa to specifically take a look at the incisional granuloma that is present.  If he is still having symptoms at that point, could consider checking labs such as ESR/CRP given that he will be 4 months out from procedure and these values should have normalized.  Follow-Up Instructions: No follow-ups on file.   Orders:  Orders Placed This Encounter  Procedures   XR KNEE 3 VIEW RIGHT   XR HIP UNILAT W OR W/O PELVIS 2-3 VIEWS RIGHT   Meds ordered this encounter  Medications  gabapentin (NEURONTIN) 100 MG capsule    Sig: Take 1 capsule (100 mg total) by mouth 3 (three) times daily as needed.    Dispense:  60 capsule    Refill:  2   cyclobenzaprine (FLEXERIL) 10 MG tablet    Sig: Take 1 tablet (10 mg total) by mouth 3 (three) times daily as needed for muscle spasms. Do not take with Robaxin    Dispense:  30 tablet    Refill:  1    Imaging: No results found.  PMFS History: Patient Active Problem List   Diagnosis Date Noted   S/P right knee surgery 02/18/2022   S/P TKR (total knee replacement), right 05/14/21 07/02/2021   Status post total right knee replacement 05/14/2021   Primary osteoarthritis of right knee    Renal cyst 10/03/2020   Frequency of micturition  10/03/2020   Benign prostatic hyperplasia with urinary obstruction 10/03/2020   IBS (irritable bowel syndrome) 07/05/2020   Lumbar disc herniation 06/29/2019   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HCC)    Primary osteoarthritis, left shoulder    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   History of colonic polyps 11/26/2017   S/P total knee replacement, left 06/16/17 06/16/2017   Primary osteoarthritis of left knee    Hyperlipidemia 09/04/2016   Angina pectoris (HCC) 09/04/2016   OSA (obstructive sleep apnea) 09/04/2016   COPD (chronic obstructive pulmonary disease) (HCC) 09/04/2016   Rotator cuff tear 05/05/2014   S/P shoulder surgery 09/26/2013   Arthritis, shoulder region 09/26/2013   Bursitis, shoulder 09/26/2013   Synovitis of shoulder 09/26/2013   Labral tear of shoulder, degenerative 09/26/2013   Biceps tendon tear 09/26/2013   Rotator cuff syndrome of left shoulder 06/21/2013   Arthritis 06/21/2013   Patellofemoral arthritis of right knee 03/29/2013   Effusion of knee joint 03/29/2013   Bursitis/tendonitis, shoulder 03/10/2013   Effusion of knee joint, left 08/18/2011   Knee pain 08/18/2011   Acute torn meniscus 07/30/2011   Old torn meniscus of knee 07/30/2011   ARTHRITIS, LEFT KNEE 10/15/2010   MEDIAL MENISCUS TEAR, RIGHT 10/15/2010   HIP PAIN 01/15/2010   DEGENERATIVE DISC DISEASE, LUMBOSACRAL SPINE W/RADICULOPATHY 01/15/2010   PLICA SYNDROME 08/09/2009   DERANGEMENT MENISCUS 07/09/2009   JOINT EFFUSION, LEFT KNEE 07/09/2009   Unilateral primary osteoarthritis, left knee 01/30/2009   KNEE PAIN 01/30/2009   ANKLE SPRAIN, RIGHT 11/08/2007   Past Medical History:  Diagnosis Date   Anxiety    Arthritis    Asthma    BPH (benign prostatic hyperplasia)    Complication of anesthesia    pt had a hard time being able to move after spinal anesthesia , 3-4 hours   Depression    GERD (gastroesophageal reflux disease)    Gout    no meds   Headache(784.0)    otc meds prn    Heart murmur    dx as a child, no problems as an adult   Hyperlipidemia    IBS (irritable bowel syndrome)    PONV (postoperative nausea and vomiting)    Pre-diabetes    Borderline, diet and exercise, no med   Sleep apnea    uses CIPAP machine at night   Wears partial dentures    bottom partial    Family History  Problem Relation Age of Onset   Alzheimer's disease Father    Diabetes Father    Diabetes Other    Lung disease Other    Arthritis Other    Anesthesia problems Neg Hx  Hypotension Neg Hx    Malignant hyperthermia Neg Hx    Pseudochol deficiency Neg Hx    Sleep apnea Neg Hx     Past Surgical History:  Procedure Laterality Date   APPENDECTOMY     BACK SURGERY  2002   neck and back fusion   BIOPSY  12/27/2015   Procedure: BIOPSY;  Surgeon: Rogene Houston, MD;  [facility]: [facility];  Service: Endoscopy;;  Fundus biopsies and duodenal biopsies   BIOPSY  02/10/2020   Procedure: BIOPSY;  Surgeon: Rogene Houston, MD;  [facility]: [facility];  Service: Endoscopy;;  antral   CARDIAC CATHETERIZATION     CARDIAC CATHETERIZATION N/A 09/04/2016   Procedure: Right/Left Heart Cath and Coronary Angiography;  Surgeon: Peter M Martinique, MD;  Location: [facility];  Service: Cardiovascular;  Laterality: N/A;   CHOLECYSTECTOMY     CHONDROPLASTY  08/15/2011   Procedure: CHONDROPLASTY;  Surgeon: [name], MD;  Location: [facility];  Service: Orthopedics;  Laterality: Left;   COLONOSCOPY  06/27/2011   Procedure: COLONOSCOPY;  Surgeon: Rogene Houston, MD;  [facility];  Service: Endoscopy;  Laterality: N/A;  9:00 / Pt to be here at 9am for 10:45 procedure, benign polyps removed   COLONOSCOPY N/A 10/05/2014   Procedure: COLONOSCOPY;  Surgeon: Rogene Houston, MD;  [facility];  Service: Endoscopy;  Laterality: N/A;  930   COLONOSCOPY N/A 02/11/2018   Procedure: COLONOSCOPY;  Surgeon: Rogene Houston, MD;  [facility];  Service:  Endoscopy;  Laterality: N/A;  830   ESOPHAGOGASTRODUODENOSCOPY N/A 12/27/2015   Procedure: ESOPHAGOGASTRODUODENOSCOPY (EGD);  Surgeon: Rogene Houston, MD;  [facility];  Service: Endoscopy;  Laterality: N/A;  3:00   ESOPHAGOGASTRODUODENOSCOPY (EGD) WITH PROPOFOL N/A 02/10/2020   Procedure: ESOPHAGOGASTRODUODENOSCOPY (EGD) WITH PROPOFOL;  Surgeon: Rogene Houston, MD;  [facility];  Service: Endoscopy;  Laterality: N/A;  155   HERNIA REPAIR     umbilical hernia   JOINT REPLACEMENT     knee and shoulder   KNEE ARTHROSCOPY     left knee   KNEE ARTHROSCOPY     right knee    LUMBAR LAMINECTOMY/DECOMPRESSION MICRODISCECTOMY  09/14/2012   Procedure: LUMBAR LAMINECTOMY/DECOMPRESSION MICRODISCECTOMY 1 LEVEL;  Surgeon: Floyce Stakes, MD;  Location: [facility];  Service: Neurosurgery;  Laterality: Right;  Right Lumbar three-four Diskectomy   neck fusion  2005   PATELLA-FEMORAL ARTHROPLASTY Right 02/18/2022   Procedure: RIGHT KNEE PATELLA REPLACEMENT, CEMENTED;  Surgeon: Meredith Pel, MD;  Location: [place];  Service: Orthopedics;  Laterality: Right;   POLYPECTOMY  02/11/2018   Procedure: POLYPECTOMY;  Surgeon: Rogene Houston, MD;  [facility];  Service: Endoscopy;;  colon   REVISION TOTAL SHOULDER TO REVERSE TOTAL SHOULDER Left 12/16/2018   Procedure: REVISION TOTAL SHOULDER TO REVERSE TOTAL SHOULDER;  Surgeon: Meredith Pel, MD;  Location: [place];  Service: Orthopedics;  Laterality: Left;   SHOULDER ARTHROSCOPY WITH BICEPSTENOTOMY Left 09/23/2013   Procedure: SHOULDER ARTHROSCOPY WITH BICEPSTENOTOMY AND EXTENSIVE DEBRIDEMENT;  Surgeon: Carole Civil, MD;  [facility]: [facility];  Service: Orthopedics;  Laterality: Left;   SHOULDER ARTHROSCOPY WITH ROTATOR CUFF REPAIR Left 05/05/2014   Procedure: SHOULDER ARTHROSCOPY LIMITED DEBRIDEMENT;  Surgeon: Carole Civil, MD;  Location: [facility];  Service: Orthopedics;  Laterality: Left;   SHOULDER OPEN  ROTATOR CUFF REPAIR Left 05/05/2014   Procedure: ROTATOR CUFF REPAIR SHOULDER OPEN;  Surgeon: Dorothyann Peng  Vela Prose, MD;  Location: [facility];  Service: Orthopedics;  Laterality: Left;   SHOULDER OPEN ROTATOR CUFF REPAIR Left 12/16/2018   Procedure: LEFT SHOULDER POSSIBLE SUBSCAPULARIS REPAIR VS. REVISION TO REVERSE TOTAL SHOULDER REPLACEMENT;  Surgeon: Meredith Pel, MD;  Location: [place];  Service: Orthopedics;  Laterality: Left;   SHOULDER SURGERY Right    Open [place] procedure   SPINAL FUSION     x2, 2003 and 2007   TOTAL KNEE ARTHROPLASTY Left 06/16/2017   Procedure: LEFT TOTAL KNEE ARTHROPLASTY;  Surgeon: Carole Civil, MD;  Location: [facility];  Service: Orthopedics;  Laterality: Left;   TOTAL KNEE ARTHROPLASTY Right 05/14/2021   Procedure: TOTAL KNEE ARTHROPLASTY;  Surgeon: Carole Civil, MD;  Location[facility];  Service: Orthopedics;  Laterality: Right;   TOTAL SHOULDER ARTHROPLASTY Left 08/19/2018   Procedure: left shoulder replacement;  Surgeon: Meredith Pel, MD;  Location: [place];  Service: Orthopedics;  Laterality: Left;   Social History   Occupational History   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 since age 9  Vaping Use   Vaping Use: Never used  Substance and Sexual Activity   Alcohol use: No    Alcohol/week: 0.0 standard drinks of alcohol   Drug use: No   Sexual activity: Not Currently

## 2022-06-06 ENCOUNTER — Encounter: Payer: Self-pay | Admitting: Internal Medicine

## 2022-06-06 ENCOUNTER — Ambulatory Visit (INDEPENDENT_AMBULATORY_CARE_PROVIDER_SITE_OTHER): Payer: PPO | Admitting: Internal Medicine

## 2022-06-06 VITALS — BP 138/66 | HR 61 | Ht 66.0 in | Wt 234.8 lb

## 2022-06-06 DIAGNOSIS — G8929 Other chronic pain: Secondary | ICD-10-CM

## 2022-06-06 DIAGNOSIS — J449 Chronic obstructive pulmonary disease, unspecified: Secondary | ICD-10-CM | POA: Diagnosis not present

## 2022-06-06 DIAGNOSIS — Z131 Encounter for screening for diabetes mellitus: Secondary | ICD-10-CM

## 2022-06-06 DIAGNOSIS — R5383 Other fatigue: Secondary | ICD-10-CM

## 2022-06-06 DIAGNOSIS — Z1329 Encounter for screening for other suspected endocrine disorder: Secondary | ICD-10-CM

## 2022-06-06 DIAGNOSIS — Z13220 Encounter for screening for lipoid disorders: Secondary | ICD-10-CM

## 2022-06-06 DIAGNOSIS — G4733 Obstructive sleep apnea (adult) (pediatric): Secondary | ICD-10-CM

## 2022-06-06 DIAGNOSIS — Z7689 Persons encountering health services in other specified circumstances: Secondary | ICD-10-CM | POA: Insufficient documentation

## 2022-06-06 DIAGNOSIS — Z720 Tobacco use: Secondary | ICD-10-CM | POA: Insufficient documentation

## 2022-06-06 DIAGNOSIS — Z23 Encounter for immunization: Secondary | ICD-10-CM

## 2022-06-06 DIAGNOSIS — Z114 Encounter for screening for human immunodeficiency virus [HIV]: Secondary | ICD-10-CM

## 2022-06-06 DIAGNOSIS — Z1159 Encounter for screening for other viral diseases: Secondary | ICD-10-CM

## 2022-06-06 DIAGNOSIS — E782 Mixed hyperlipidemia: Secondary | ICD-10-CM

## 2022-06-06 DIAGNOSIS — M7918 Myalgia, other site: Secondary | ICD-10-CM

## 2022-06-06 DIAGNOSIS — F339 Major depressive disorder, recurrent, unspecified: Secondary | ICD-10-CM

## 2022-06-06 NOTE — Assessment & Plan Note (Signed)
Currently prescribed Zoloft 50 mg daily for a history of depression.  He reports stable mood today.  No changes today.

## 2022-06-06 NOTE — Assessment & Plan Note (Signed)
Previously documented history of hyperlipidemia.  He is currently prescribed rosuvastatin 10 mg daily.  No changes today.  Repeat lipid panel ordered.

## 2022-06-06 NOTE — Assessment & Plan Note (Signed)
Presenting today to establish care.  Recent medical records and current medication regimen reviewed. -Basic labs ordered today, including one-time HIV/HCV screening -Influenza vaccine administered today

## 2022-06-06 NOTE — Assessment & Plan Note (Signed)
Christopher [name] has an extensive orthopedic surgical history.  He is currently prescribed gabapentin and Tylenol for as needed use.

## 2022-06-06 NOTE — Assessment & Plan Note (Signed)
Previously documented history of OSA.  He endorses CPAP compliance and states that he recently underwent a Pap titration.  Despite these measures, he continues to endorse sleep that is not restful. -No changes for now.  Consider pulmonology referral at follow-up.

## 2022-06-06 NOTE — Assessment & Plan Note (Signed)
He endorses recent fatigue, noting that he does not get restful sleep and has to periodically rest throughout the day. -Baseline labs ordered today, including iron studies, TSH/T4, and free/total testosterone level.  He will return for fasting a.m. labs early next week

## 2022-06-06 NOTE — Assessment & Plan Note (Signed)
Currently smokes 1 pack/day of cigarettes and states he has been smoking since age 66.  He has mostly smoked 1 pack/day over this time.  He is aware of the need to quit cigarettes.  He states that he was previously prescribed Chantix but it gave him vivid nightmares and he stopped taking the medication. -Counseled on cessation.  He will take time over the next month to consider a quit date.  At that time we can start NRT if he desires. -Discussed referral to lung cancer screening program at follow-up -The patient was counseled on the dangers of tobacco use, and was advised to quit.  Reviewed strategies to maximize success, including removing cigarettes and smoking materials from environment, stress management, substitution of other forms of reinforcement, support of family/friends and written materials.

## 2022-06-06 NOTE — Assessment & Plan Note (Signed)
Previously documented history of COPD.  He is prescribed an albuterol inhaler for as needed use but is not currently on a maintenance inhaler.  He endorses recent shortness of breath.  His pulmonary exam is unremarkable today. -Counseled on smoking cessation -Continue albuterol inhaler for now.  Consider referral to pulmonology at follow-up in 1 month.

## 2022-06-06 NOTE — Progress Notes (Signed)
New Patient Office Visit  Subjective    Patient ID: Christopher [name], male    DOB: 1956-07-27  Age: 66 y.o. MRN: [id number]  CC:  Chief Complaint  Patient presents with   Establish Care    HPI Christopher [name] presents to establish care.  He is a 66 year old male reporting a past medical history of tobacco use, chronic musculoskeletal pain secondary to an extensive orthopedic surgical history, OSA, COPD, HLD, and depression.  He was previously followed by Dr. Huel Cote with [facility] health.  [name] is accompanied by his wife today.  His acute concerns are recent fatigue and shortness of breath.  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  [name] has noted over the last 2-3 months that he has 2 rest in his truck periodically throughout the day to catch his breath because he is fatigued.  He currently uses an albuterol rescue inhaler 2-3 times daily due to shortness of breath.  He does not have a maintenance inhaler for COPD.  He states that his sleep is not restful, noting that he wakes up still feeling fatigued.  He endorses compliance with CPAP and reports undergoing a Pap titration fairly recently.  Acute concerns, chronic medical conditions, and outstanding preventative healthcare maintenance items discussed today are individually addressed in A/P below.   Outpatient Encounter Medications as of 06/06/2022  Medication Sig   acetaminophen (TYLENOL) 500 MG tablet Take 500-1,000 mg by mouth every 6 (six) hours as needed for moderate pain.   cyclobenzaprine (FLEXERIL) 10 MG tablet Take 1 tablet (10 mg total) by mouth 3 (three) times daily as needed for muscle spasms. Do not take with Robaxin   EPINEPHrine 0.3 mg/0.3 mL IJ SOAJ injection Inject 0.3 mg into the muscle as needed for anaphylaxis.   gabapentin (NEURONTIN) 100 MG capsule Take 1 capsule (100 mg total) by mouth 3 (three) times daily as needed.   Menthol, Topical Analgesic, (ICY HOT EX) Apply 1 application topically daily as  needed (pain).   nitroGLYCERIN (NITROSTAT) 0.4 MG SL tablet PLACE (1) TABLET UNDER TONGUE EVERY 5 MINUTES UP TO (3) DOSES. IF NO RELIEF CALL 911.   pantoprazole (PROTONIX) 40 MG tablet TAKE (1) TABLET TWICE A DAY BEFORE MEALS. (Patient taking differently: Take 40 mg by mouth daily.)   pregabalin (LYRICA) 50 MG capsule Take 1 capsule (50 mg total) by mouth 2 (two) times daily. (Patient taking differently: Take 50 mg by mouth 2 (two) times daily as needed (pain).)   PROAIR HFA 108 (90 Base) MCG/ACT inhaler Inhale 2 puffs into the lungs every 6 (six) hours as needed for wheezing or shortness of breath.    rosuvastatin (CRESTOR) 10 MG tablet Take 10 mg by mouth daily.   sertraline (ZOLOFT) 50 MG tablet Take 50 mg by mouth in the morning.   [DISCONTINUED] oxyCODONE-acetaminophen (PERCOCET) 5-325 MG tablet Take 1 tablet by mouth at bedtime as needed for severe pain.   No facility-administered encounter medications on file as of 06/06/2022.    Past Medical History:  Diagnosis Date   Anxiety    Arthritis    Asthma    BPH (benign prostatic hyperplasia)    Complication of anesthesia    pt had a hard time being able to move after spinal anesthesia , 3-4 hours   Depression    GERD (gastroesophageal reflux disease)    Gout    no meds   Headache(784.0)    otc meds prn   Heart murmur  dx as a child, no problems as an adult   Hyperlipidemia    IBS (irritable bowel syndrome)    PONV (postoperative nausea and vomiting)    Pre-diabetes    Borderline, diet and exercise, no med   Sleep apnea    uses CIPAP machine at night   Wears partial dentures    bottom partial   Past Surgical History:  Procedure Laterality Date   APPENDECTOMY     BACK SURGERY  2002   neck and back fusion   BIOPSY  12/27/2015   Procedure: BIOPSY;  Surgeon: Rogene Houston, MD;  [facility];  Service: Endoscopy;;  Fundus biopsies and duodenal biopsies   BIOPSY  02/10/2020   Procedure: BIOPSY;  Surgeon: Rogene Houston, MD;  [facility];  Service: Endoscopy;;  antral   CARDIAC CATHETERIZATION     CARDIAC CATHETERIZATION N/A 09/04/2016   Procedure: Right/Left Heart Cath and Coronary Angiography;  Surgeon: Peter M Martinique, MD;  Location: [place] [facility];  Service: Cardiovascular;  Laterality: N/A;   CHOLECYSTECTOMY     CHONDROPLASTY  08/15/2011   Procedure: CHONDROPLASTY;  Surgeon: [name], MD;  Location: [facility];  Service: Orthopedics;  Laterality: Left;   COLONOSCOPY  06/27/2011   Procedure: COLONOSCOPY;  Surgeon: Rogene Houston, MD;  [facility];  Service: Endoscopy;  Laterality: N/A;  9:00 / Pt to be here at 9am for 10:45 procedure, benign polyps removed   COLONOSCOPY N/A 10/05/2014   Procedure: COLONOSCOPY;  Surgeon: Rogene Houston, MD;  [facility];  Service: Endoscopy;  Laterality: N/A;  930   COLONOSCOPY N/A 02/11/2018   Procedure: COLONOSCOPY;  Surgeon: Rogene Houston, MD;  [facility];  Service: Endoscopy;  Laterality: N/A;  830   ESOPHAGOGASTRODUODENOSCOPY N/A 12/27/2015   Procedure: ESOPHAGOGASTRODUODENOSCOPY (EGD);  Surgeon: Rogene Houston, MD;  [facility];  Service: Endoscopy;  Laterality: N/A;  3:00   ESOPHAGOGASTRODUODENOSCOPY (EGD) WITH PROPOFOL N/A 02/10/2020   Procedure: ESOPHAGOGASTRODUODENOSCOPY (EGD) WITH PROPOFOL;  Surgeon: Rogene Houston, MD;  [facility];  Service: Endoscopy;  Laterality: N/A;  155   HERNIA REPAIR     umbilical hernia   JOINT REPLACEMENT     knee and shoulder   KNEE ARTHROSCOPY     left knee   KNEE ARTHROSCOPY     right knee    LUMBAR LAMINECTOMY/DECOMPRESSION MICRODISCECTOMY  09/14/2012   Procedure: LUMBAR LAMINECTOMY/DECOMPRESSION MICRODISCECTOMY 1 LEVEL;  Surgeon: Floyce Stakes, MD;  Location: [place] [facility];  Service: Neurosurgery;  Laterality: Right;  Right Lumbar three-four Diskectomy   neck fusion  2005   PATELLA-FEMORAL ARTHROPLASTY Right 02/18/2022    Procedure: RIGHT KNEE PATELLA REPLACEMENT, CEMENTED;  Surgeon: Meredith Pel, MD;  Location: [place];  Service: Orthopedics;  Laterality: Right;   POLYPECTOMY  02/11/2018   Procedure: POLYPECTOMY;  Surgeon: Rogene Houston, MD;  [facility];  Service: Endoscopy;;  colon   REVISION TOTAL SHOULDER TO REVERSE TOTAL SHOULDER Left 12/16/2018   Procedure: REVISION TOTAL SHOULDER TO REVERSE TOTAL SHOULDER;  Surgeon: Meredith Pel, MD;  Location: [place];  Service: Orthopedics;  Laterality: Left;   SHOULDER ARTHROSCOPY WITH BICEPSTENOTOMY Left 09/23/2013   Procedure: SHOULDER ARTHROSCOPY WITH BICEPSTENOTOMY AND EXTENSIVE DEBRIDEMENT;  Surgeon: Carole Civil, MD;  [facility];  Service: Orthopedics;  Laterality: Left;   SHOULDER ARTHROSCOPY WITH ROTATOR CUFF REPAIR Left 05/05/2014   Procedure: SHOULDER ARTHROSCOPY LIMITED DEBRIDEMENT;  Surgeon: Carole Civil, MD;  [facility]: [facility];  Service: Orthopedics;  Laterality: Left;   SHOULDER OPEN ROTATOR CUFF REPAIR Left 05/05/2014   Procedure: ROTATOR CUFF REPAIR SHOULDER OPEN;  Surgeon: Carole Civil, MD;  [facility];  Service: Orthopedics;  Laterality: Left;   SHOULDER OPEN ROTATOR CUFF REPAIR Left 12/16/2018   Procedure: LEFT SHOULDER POSSIBLE SUBSCAPULARIS REPAIR VS. REVISION TO REVERSE TOTAL SHOULDER REPLACEMENT;  Surgeon: Meredith Pel, MD;  Location: [place];  Service: Orthopedics;  Laterality: Left;   SHOULDER SURGERY Right    Open [place]ford procedure   SPINAL FUSION     x2, 2003 and 2007   TOTAL KNEE ARTHROPLASTY Left 06/16/2017   Procedure: LEFT TOTAL KNEE ARTHROPLASTY;  Surgeon: Carole Civil, MD;  [facility];  Service: Orthopedics;  Laterality: Left;   TOTAL KNEE ARTHROPLASTY Right 05/14/2021   Procedure: TOTAL KNEE ARTHROPLASTY;  Surgeon: Carole Civil, MD;  [facility]: [facility];  Service: Orthopedics;  Laterality: Right;   TOTAL SHOULDER ARTHROPLASTY Left 08/19/2018   Procedure: left  shoulder replacement;  Surgeon: Meredith Pel, MD;  Location: [place];  Service: Orthopedics;  Laterality: Left;   Family History  Problem Relation Age of Onset   Alzheimer's disease Father    Diabetes Father    Diabetes Other    Lung disease Other    Arthritis Other    Anesthesia problems Neg Hx    Hypotension Neg Hx    Malignant hyperthermia Neg Hx    Pseudochol deficiency Neg Hx    Sleep apnea Neg Hx    Social History   Socioeconomic History   Marital status: Married    Spouse name: Not on file   Number of children: Not on file   Years of education: Not on file   Highest education level: Not on file  Occupational History   Occupation: Buyer, retail    Employer: DISABLED  Tobacco Use   Smoking status: Every Day    Packs/day: 1.00    Years: 44.00    Total pack years: 44.00    Types: Cigarettes   Smokeless tobacco: Never   Tobacco comments:    smokes a pack a day. since age 30  Vaping Use   Vaping Use: Never used  Substance and Sexual Activity   Alcohol use: No    Alcohol/week: 0.0 standard drinks of alcohol   Drug use: No   Sexual activity: Not Currently  Other Topics Concern   Not on file  Social History Narrative   Lives at home with wife   Right handed   Caffeine: 2 cups of coffee, 2 cans of soda, 4-5 cups of tea daily.   Social Determinants of Health   Financial Resource Strain: Not on file  Food Insecurity: Not on file  Transportation Needs: Not on file  Physical Activity: Not on file  Stress: Not on file  Social Connections: Not on file  Intimate Partner Violence: Not on file   Review of Systems  Constitutional:  Positive for malaise/fatigue. Negative for chills and fever.  HENT:  Negative for congestion and sore throat.   Eyes: Negative.   Respiratory:  Positive for shortness of breath. Negative for cough, sputum production and wheezing.   Cardiovascular:  Negative for chest pain, palpitations, orthopnea, leg swelling and PND.   Gastrointestinal:  Negative for abdominal pain, blood in stool, constipation, diarrhea, melena, nausea and vomiting.  Genitourinary:  Negative for dysuria and hematuria.  Musculoskeletal:  Positive for back  pain, joint pain and myalgias.  Skin:  Negative for itching and rash.  Neurological:  Negative for dizziness, tingling and headaches.  Psychiatric/Behavioral:  Negative for depression. The patient does not have insomnia.    Objective    BP 138/66 (BP Location: Right Arm, Cuff Size: Large)   Pulse 61   Ht '5\' 6"'$  (1.676 m)   Wt 234 lb 12.8 oz (106.5 kg)   SpO2 95%   BMI 37.90 kg/m   Physical Exam Vitals reviewed.  Constitutional:      General: He is not in acute distress.    Appearance: Normal appearance. He is obese. He is not ill-appearing.  HENT:     Head: Normocephalic and atraumatic.     Nose: Nose normal. No congestion or rhinorrhea.     Mouth/Throat:     Mouth: Mucous membranes are moist.     Pharynx: Oropharynx is clear.  Eyes:     Extraocular Movements: Extraocular movements intact.     Conjunctiva/sclera: Conjunctivae normal.     Pupils: Pupils are equal, round, and reactive to light.  Cardiovascular:     Rate and Rhythm: Normal rate and regular rhythm.     Pulses: Normal pulses.     Heart sounds: Normal heart sounds. No murmur heard. Pulmonary:     Effort: Pulmonary effort is normal.     Breath sounds: Normal breath sounds. No wheezing, rhonchi or rales.  Abdominal:     General: Abdomen is flat. Bowel sounds are normal. There is no distension.     Palpations: Abdomen is soft.     Tenderness: There is no abdominal tenderness.  Musculoskeletal:     Cervical back: Normal range of motion.     Right lower leg: No edema.     Left lower leg: No edema.  Skin:    General: Skin is warm and dry.     Capillary Refill: Capillary refill takes less than 2 seconds.  Neurological:     General: No focal deficit present.     Mental Status: He is alert and oriented to  person, place, and time.     Motor: No weakness.  Psychiatric:        Mood and Affect: Mood normal.        Behavior: Behavior normal.        Thought Content: Thought content normal.    Last CBC Lab Results  Component Value Date   WBC 16.2 (H) 02/19/2022   HGB 12.9 (L) 02/19/2022   HCT 37.9 (L) 02/19/2022   MCV 82.6 02/19/2022   MCH 28.1 02/19/2022   RDW 14.5 02/19/2022   PLT 243 44/31/5400   Last metabolic panel Lab Results  Component Value Date   GLUCOSE 112 (H) 02/11/2022   NA 136 02/11/2022   K 4.4 02/11/2022   CL 103 02/11/2022   CO2 26 02/11/2022   BUN 13 02/11/2022   CREATININE 1.03 02/11/2022   GFRNONAA >60 02/11/2022   CALCIUM 9.1 02/11/2022   PROT 6.6 02/02/2020   ALBUMIN 4.3 09/20/2015   BILITOT 0.5 02/02/2020   ALKPHOS 69 09/20/2015   AST 14 02/02/2020   ALT 22 02/02/2020   ANIONGAP 7 02/11/2022   Last hemoglobin A1c Lab Results  Component Value Date   HGBA1C 5.9 (H) 05/10/2021   Assessment & Plan:   Problem List Items Addressed This Visit       OSA (obstructive sleep apnea)    Previously documented history of OSA.  He endorses CPAP compliance and states  that he recently underwent a Pap titration.  Despite these measures, he continues to endorse sleep that is not restful. -No changes for now.  Consider pulmonology referral at follow-up.      COPD (chronic obstructive pulmonary disease) (HCC)    Previously documented history of COPD.  He is prescribed an albuterol inhaler for as needed use but is not currently on a maintenance inhaler.  He endorses recent shortness of breath.  His pulmonary exam is unremarkable today. -Counseled on smoking cessation -Continue albuterol inhaler for now.  Consider referral to pulmonology at follow-up in 1 month.      Hyperlipidemia    Previously documented history of hyperlipidemia.  He is currently prescribed rosuvastatin 10 mg daily.  No changes today.  Repeat lipid panel ordered.      Depression, recurrent  ([place])    Currently prescribed Zoloft 50 mg daily for a history of depression.  He reports stable mood today.  No changes today.      Chronic musculoskeletal pain    Mr. Rezendes has an extensive orthopedic surgical history.  He is currently prescribed gabapentin and Tylenol for as needed use.      Fatigue - Primary    He endorses recent fatigue, noting that he does not get restful sleep and has to periodically rest throughout the day. -Baseline labs ordered today, including iron studies, TSH/T4, and free/total testosterone level.  He will return for fasting a.m. labs early next week      Current tobacco use    Currently smokes 1 pack/day of cigarettes and states he has been smoking since age 59.  He has mostly smoked 1 pack/day over this time.  He is aware of the need to quit cigarettes.  He states that he was previously prescribed Chantix but it gave him vivid nightmares and he stopped taking the medication. -Counseled on cessation.  He will take time over the next month to consider a quit date.  At that time we can start NRT if he desires. -Discussed referral to lung cancer screening program at follow-up -The patient was counseled on the dangers of tobacco use, and was advised to quit.  Reviewed strategies to maximize success, including removing cigarettes and smoking materials from environment, stress management, substitution of other forms of reinforcement, support of family/friends and written materials.      Encounter to establish care    Presenting today to establish care.  Recent medical records and current medication regimen reviewed. -Basic labs ordered today, including one-time HIV/HCV screening -Influenza vaccine administered today      Return in about 4 weeks (around 07/04/2022).   Johnette Abraham, MD

## 2022-06-06 NOTE — Patient Instructions (Signed)
It was a pleasure to see you today.  Thank you for giving [place] the opportunity to be involved in your care.  Below is a brief recap of your visit and next steps.  We will plan to see you again in 1 month.  Summary We will check basic labs today and check labs related to fatigue.  Next steps Follow up in 1 month for next steps I will notify you of lab results

## 2022-06-07 LAB — HCV AB W REFLEX TO QUANT PCR: HCV Ab: NONREACTIVE

## 2022-06-07 LAB — HIV ANTIBODY (ROUTINE TESTING W REFLEX): HIV Screen 4th Generation wRfx: NONREACTIVE

## 2022-06-07 LAB — HCV INTERPRETATION

## 2022-06-09 ENCOUNTER — Other Ambulatory Visit: Payer: Self-pay

## 2022-06-09 DIAGNOSIS — R5383 Other fatigue: Secondary | ICD-10-CM

## 2022-06-13 LAB — CBC WITH DIFFERENTIAL/PLATELET
Basophils Absolute: 0 10*3/uL (ref 0.0–0.2)
Basos: 0 %
EOS (ABSOLUTE): 0 10*3/uL (ref 0.0–0.4)
Eos: 0 %
Hematocrit: 39.4 % (ref 37.5–51.0)
Hemoglobin: 13.4 g/dL (ref 13.0–17.7)
Immature Grans (Abs): 0.1 10*3/uL (ref 0.0–0.1)
Immature Granulocytes: 1 %
Lymphocytes Absolute: 2.3 10*3/uL (ref 0.7–3.1)
Lymphs: 31 %
MCH: 28.1 pg (ref 26.6–33.0)
MCHC: 34 g/dL (ref 31.5–35.7)
MCV: 83 fL (ref 79–97)
Monocytes Absolute: 0.5 10*3/uL (ref 0.1–0.9)
Monocytes: 6 %
Neutrophils Absolute: 4.5 10*3/uL (ref 1.4–7.0)
Neutrophils: 62 %
Platelets: 224 10*3/uL (ref 150–450)
RBC: 4.77 x10E6/uL (ref 4.14–5.80)
RDW: 14.5 % (ref 11.6–15.4)
WBC: 7.3 10*3/uL (ref 3.4–10.8)

## 2022-06-13 LAB — CMP14+EGFR
ALT: 18 IU/L (ref 0–44)
AST: 13 IU/L (ref 0–40)
Albumin/Globulin Ratio: 2.3 — ABNORMAL HIGH (ref 1.2–2.2)
Albumin: 4.4 g/dL (ref 3.9–4.9)
Alkaline Phosphatase: 101 IU/L (ref 44–121)
BUN/Creatinine Ratio: 13 (ref 10–24)
BUN: 13 mg/dL (ref 8–27)
Bilirubin Total: 0.3 mg/dL (ref 0.0–1.2)
CO2: 24 mmol/L (ref 20–29)
Calcium: 9 mg/dL (ref 8.6–10.2)
Chloride: 102 mmol/L (ref 96–106)
Creatinine, Ser: 1.04 mg/dL (ref 0.76–1.27)
Globulin, Total: 1.9 g/dL (ref 1.5–4.5)
Glucose: 89 mg/dL (ref 70–99)
Potassium: 4.1 mmol/L (ref 3.5–5.2)
Sodium: 141 mmol/L (ref 134–144)
Total Protein: 6.3 g/dL (ref 6.0–8.5)
eGFR: 80 mL/min/{1.73_m2} (ref >59)

## 2022-06-13 LAB — TSH+FREE T4
Free T4: 1.05 ng/dL (ref 0.82–1.77)
TSH: 1.53 u[IU]/mL (ref 0.450–4.500)

## 2022-06-13 LAB — LIPID PANEL
Chol/HDL Ratio: 3.8 ratio (ref 0.0–5.0)
Cholesterol, Total: 144 mg/dL (ref 100–199)
HDL: 38 mg/dL — ABNORMAL LOW (ref >39)
LDL Chol Calc (NIH): 59 mg/dL (ref 0–99)
Triglycerides: 298 mg/dL — ABNORMAL HIGH (ref 0–149)
VLDL Cholesterol Cal: 47 mg/dL — ABNORMAL HIGH (ref 5–40)

## 2022-06-13 LAB — HEMOGLOBIN A1C
Est. average glucose Bld gHb Est-mCnc: 128 mg/dL
Hgb A1c MFr Bld: 6.1 % — ABNORMAL HIGH (ref 4.8–5.6)

## 2022-06-13 LAB — TESTOSTERONE,FREE AND TOTAL
Testosterone, Free: 2.1 pg/mL — ABNORMAL LOW (ref 6.6–18.1)
Testosterone: 131 ng/dL — ABNORMAL LOW (ref 264–916)

## 2022-06-13 LAB — IRON,TIBC AND FERRITIN PANEL
Ferritin: 141 ng/mL (ref 30–400)
Iron Saturation: 19 % (ref 15–55)
Iron: 53 ug/dL (ref 38–169)
Total Iron Binding Capacity: 279 ug/dL (ref 250–450)
UIBC: 226 ug/dL (ref 111–343)

## 2022-06-13 LAB — B12 AND FOLATE PANEL
Folate: 3.5 ng/mL (ref >3.0)
Vitamin B-12: 632 pg/mL (ref 232–1245)

## 2022-06-16 ENCOUNTER — Encounter: Payer: Self-pay | Admitting: Orthopedic Surgery

## 2022-06-16 ENCOUNTER — Ambulatory Visit: Payer: PPO | Admitting: Orthopedic Surgery

## 2022-06-16 DIAGNOSIS — G8918 Other acute postprocedural pain: Secondary | ICD-10-CM

## 2022-06-16 NOTE — Progress Notes (Signed)
Post-Op Visit Note   Patient: Christopher [name]           Date of Birth: August 08, 1956           MRN: [id number] Visit Date: 06/16/2022 PCP: Johnette Abraham, MD   Assessment & Plan:  Chief Complaint:  Chief Complaint  Patient presents with   Right Knee - Follow-up    right knee patellar resurfacing on 02/18/2022   Visit Diagnoses:  1. Post-op pain     Plan: Daris is a 66 year old patient who is now about 4 months out right knee patella replacement.  Doing well.  Feeling much better.  He is working doing yard care.  He is able to go up and down steps.  Ladders are more difficult.  On examination he has excellent range of motion from 0 to 110 degrees.  No patellofemoral crepitus or malalignment present.  No effusion in the right knee.  Incision intact.  Plan at this time is activity as tolerated with the right leg.  Follow-up with [place] as needed  Follow-Up Instructions: No follow-ups on file.   Orders:  No orders of the defined types were placed in this encounter.  No orders of the defined types were placed in this encounter.   Imaging: No results found.  PMFS History: Patient Active Problem List   Diagnosis Date Noted   Depression, recurrent ([place]) 06/06/2022   Chronic musculoskeletal pain 06/06/2022   Fatigue 06/06/2022   Current tobacco use 06/06/2022   Encounter to establish care 06/06/2022   S/P right knee surgery 02/18/2022   S/P TKR (total knee replacement), right 05/14/21 07/02/2021   Status post total right knee replacement 05/14/2021   Primary osteoarthritis of right knee    Renal cyst 10/03/2020   Frequency of micturition 10/03/2020   Benign prostatic hyperplasia with urinary obstruction 10/03/2020   IBS (irritable bowel syndrome) 07/05/2020   Lumbar disc herniation 06/29/2019   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[place])    Primary osteoarthritis, left shoulder    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   History of colonic polyps 11/26/2017   S/P total knee  replacement, left 06/16/17 06/16/2017   Primary osteoarthritis of left knee    Hyperlipidemia 09/04/2016   Angina pectoris ([place]) 09/04/2016   OSA (obstructive sleep apnea) 09/04/2016   COPD (chronic obstructive pulmonary disease) ([place]) 09/04/2016   Rotator cuff tear 05/05/2014   S/P shoulder surgery 09/26/2013   Arthritis, shoulder region 09/26/2013   Bursitis, shoulder 09/26/2013   Synovitis of shoulder 09/26/2013   Labral tear of shoulder, degenerative 09/26/2013   Biceps tendon tear 09/26/2013   Rotator cuff syndrome of left shoulder 06/21/2013   Arthritis 06/21/2013   Patellofemoral arthritis of right knee 03/29/2013   Effusion of knee joint 03/29/2013   Bursitis/tendonitis, shoulder 03/10/2013   Effusion of knee joint, left 08/18/2011   Knee pain 08/18/2011   Acute torn meniscus 07/30/2011   Old torn meniscus of knee 07/30/2011   ARTHRITIS, LEFT KNEE 10/15/2010   MEDIAL MENISCUS TEAR, RIGHT 10/15/2010   HIP PAIN 01/15/2010   DEGENERATIVE DISC DISEASE, LUMBOSACRAL SPINE W/RADICULOPATHY 43/15/4008   PLICA SYNDROME 67/61/9509   DERANGEMENT MENISCUS 07/09/2009   JOINT EFFUSION, LEFT KNEE 07/09/2009   Unilateral primary osteoarthritis, left knee 01/30/2009   KNEE PAIN 01/30/2009   ANKLE SPRAIN, RIGHT 11/08/2007   Past Medical History:  Diagnosis Date   Anxiety    Arthritis    Asthma    BPH (benign prostatic hyperplasia)    Complication of anesthesia  pt had a hard time being able to move after spinal anesthesia , 3-4 hours   Depression    GERD (gastroesophageal reflux disease)    Gout    no meds   Headache(784.0)    otc meds prn   Heart murmur    dx as a child, no problems as an adult   Hyperlipidemia    IBS (irritable bowel syndrome)    PONV (postoperative nausea and vomiting)    Pre-diabetes    Borderline, diet and exercise, no med   Sleep apnea    uses CIPAP machine at night   Wears partial dentures    bottom partial    Family History  Problem Relation  Age of Onset   Alzheimer's disease Father    Diabetes Father    Diabetes Other    Lung disease Other    Arthritis Other    Anesthesia problems Neg Hx    Hypotension Neg Hx    Malignant hyperthermia Neg Hx    Pseudochol deficiency Neg Hx    Sleep apnea Neg Hx     Past Surgical History:  Procedure Laterality Date   APPENDECTOMY     BACK SURGERY  2002   neck and back fusion   BIOPSY  12/27/2015   Procedure: BIOPSY;  Surgeon: Rogene Houston, MD;  [facility];  Service: Endoscopy;;  Fundus biopsies and duodenal biopsies   BIOPSY  02/10/2020   Procedure: BIOPSY;  Surgeon: Rogene Houston, MD;  [facility];  Service: Endoscopy;;  antral   CARDIAC CATHETERIZATION     CARDIAC CATHETERIZATION N/A 09/04/2016   Procedure: Right/Left Heart Cath and Coronary Angiography;  Surgeon: Peter M Martinique, MD;  Location: [place] [facility];  Service: Cardiovascular;  Laterality: N/A;   CHOLECYSTECTOMY     CHONDROPLASTY  08/15/2011   Procedure: CHONDROPLASTY;  Surgeon: [name], MD;  [facility];  Service: Orthopedics;  Laterality: Left;   COLONOSCOPY  06/27/2011   Procedure: COLONOSCOPY;  Surgeon: Rogene Houston, MD;  [facility];  Service: Endoscopy;  Laterality: N/A;  9:00 / Pt to be here at 9am for 10:45 procedure, benign polyps removed   COLONOSCOPY N/A 10/05/2014   Procedure: COLONOSCOPY;  Surgeon: Rogene Houston, MD;  [facility];  Service: Endoscopy;  Laterality: N/A;  930   COLONOSCOPY N/A 02/11/2018   Procedure: COLONOSCOPY;  Surgeon: Rogene Houston, MD;  [facility];  Service: Endoscopy;  Laterality: N/A;  830   ESOPHAGOGASTRODUODENOSCOPY N/A 12/27/2015   Procedure: ESOPHAGOGASTRODUODENOSCOPY (EGD);  Surgeon: Rogene Houston, MD;  [facility];  Service: Endoscopy;  Laterality: N/A;  3:00   ESOPHAGOGASTRODUODENOSCOPY (EGD) WITH PROPOFOL N/A 02/10/2020   Procedure: ESOPHAGOGASTRODUODENOSCOPY (EGD) WITH  PROPOFOL;  Surgeon: Rogene Houston, MD;  [facility];  Service: Endoscopy;  Laterality: N/A;  155   HERNIA REPAIR     umbilical hernia   JOINT REPLACEMENT     knee and shoulder   KNEE ARTHROSCOPY     left knee   KNEE ARTHROSCOPY     right knee    LUMBAR LAMINECTOMY/DECOMPRESSION MICRODISCECTOMY  09/14/2012   Procedure: LUMBAR LAMINECTOMY/DECOMPRESSION MICRODISCECTOMY 1 LEVEL;  Surgeon: Floyce Stakes, MD;  Location: [place] [facility];  Service: Neurosurgery;  Laterality: Right;  Right Lumbar three-four Diskectomy   neck fusion  2005   PATELLA-FEMORAL ARTHROPLASTY Right 02/18/2022   Procedure: RIGHT KNEE PATELLA REPLACEMENT, CEMENTED;  Surgeon: Meredith Pel, MD;  Location: [place];  Service: Orthopedics;  Laterality: Right;   POLYPECTOMY  02/11/2018   Procedure: POLYPECTOMY;  Surgeon: Rogene Houston, MD;  [facility];  Service: Endoscopy;;  colon   REVISION TOTAL SHOULDER TO REVERSE TOTAL SHOULDER Left 12/16/2018   Procedure: REVISION TOTAL SHOULDER TO REVERSE TOTAL SHOULDER;  Surgeon: Meredith Pel, MD;  Location: [place];  Service: Orthopedics;  Laterality: Left;   SHOULDER ARTHROSCOPY WITH BICEPSTENOTOMY Left 09/23/2013   Procedure: SHOULDER ARTHROSCOPY WITH BICEPSTENOTOMY AND EXTENSIVE DEBRIDEMENT;  Surgeon: Carole Civil, MD;  [facility];  Service: Orthopedics;  Laterality: Left;   SHOULDER ARTHROSCOPY WITH ROTATOR CUFF REPAIR Left 05/05/2014   Procedure: SHOULDER ARTHROSCOPY LIMITED DEBRIDEMENT;  Surgeon: Carole Civil, MD;  [facility];  Service: Orthopedics;  Laterality: Left;   SHOULDER OPEN ROTATOR CUFF REPAIR Left 05/05/2014   Procedure: ROTATOR CUFF REPAIR SHOULDER OPEN;  Surgeon: Carole Civil, MD;  [facility];  Service: Orthopedics;  Laterality: Left;   SHOULDER OPEN ROTATOR CUFF REPAIR Left 12/16/2018   Procedure: LEFT SHOULDER POSSIBLE SUBSCAPULARIS REPAIR VS. REVISION TO REVERSE TOTAL SHOULDER REPLACEMENT;   Surgeon: Meredith Pel, MD;  Location: [place];  Service: Orthopedics;  Laterality: Left;   SHOULDER SURGERY Right    Open Mumford procedure   SPINAL FUSION     x2, 2003 and 2007   TOTAL KNEE ARTHROPLASTY Left 06/16/2017   Procedure: LEFT TOTAL KNEE ARTHROPLASTY;  Surgeon: Carole Civil, MD;  [facility];  Service: Orthopedics;  Laterality: Left;   TOTAL KNEE ARTHROPLASTY Right 05/14/2021   Procedure: TOTAL KNEE ARTHROPLASTY;  Surgeon: Carole Civil, MD;  [facility];  Service: Orthopedics;  Laterality: Right;   TOTAL SHOULDER ARTHROPLASTY Left 08/19/2018   Procedure: left shoulder replacement;  Surgeon: Meredith Pel, MD;  Location: [place];  Service: Orthopedics;  Laterality: Left;   Social History   Occupational History   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 since age 89  Vaping Use   Vaping Use: Never used  Substance and Sexual Activity   Alcohol use: No    Alcohol/week: 0.0 standard drinks of alcohol   Drug use: No   Sexual activity: Not Currently

## 2022-06-17 LAB — FSH/LH
FSH: 6.5 m[IU]/mL (ref 1.5–12.4)
LH: 4.9 m[IU]/mL (ref 1.7–8.6)

## 2022-06-17 LAB — TESTOSTERONE,FREE AND TOTAL
Testosterone, Free: 1.7 pg/mL — ABNORMAL LOW (ref 6.6–18.1)
Testosterone: 95 ng/dL — ABNORMAL LOW (ref 264–916)

## 2022-07-07 ENCOUNTER — Encounter: Payer: Self-pay | Admitting: Internal Medicine

## 2022-07-07 ENCOUNTER — Ambulatory Visit (INDEPENDENT_AMBULATORY_CARE_PROVIDER_SITE_OTHER): Payer: PPO | Admitting: Internal Medicine

## 2022-07-07 VITALS — BP 128/64 | HR 68 | Ht 66.0 in | Wt 231.0 lb

## 2022-07-07 DIAGNOSIS — E782 Mixed hyperlipidemia: Secondary | ICD-10-CM

## 2022-07-07 DIAGNOSIS — E291 Testicular hypofunction: Secondary | ICD-10-CM | POA: Diagnosis not present

## 2022-07-07 DIAGNOSIS — J449 Chronic obstructive pulmonary disease, unspecified: Secondary | ICD-10-CM

## 2022-07-07 DIAGNOSIS — Z122 Encounter for screening for malignant neoplasm of respiratory organs: Secondary | ICD-10-CM

## 2022-07-07 DIAGNOSIS — Z01810 Encounter for preprocedural cardiovascular examination: Secondary | ICD-10-CM | POA: Insufficient documentation

## 2022-07-07 DIAGNOSIS — F339 Major depressive disorder, recurrent, unspecified: Secondary | ICD-10-CM

## 2022-07-07 DIAGNOSIS — G4733 Obstructive sleep apnea (adult) (pediatric): Secondary | ICD-10-CM | POA: Diagnosis not present

## 2022-07-07 DIAGNOSIS — Z720 Tobacco use: Secondary | ICD-10-CM

## 2022-07-07 MED ORDER — ROSUVASTATIN CALCIUM 20 MG PO TABS: 20.0000 mg | ORAL_TABLET | Freq: Every day | ORAL | 3 refills | Status: DC

## 2022-07-07 NOTE — Patient Instructions (Signed)
It was a pleasure to see you today.  Thank you for giving [place] the opportunity to be involved in your care.  Below is a brief recap of your visit and next steps.  We will plan to see you again in 6 weeks.   Summary I have placed referrals to pulmonology and urology today I have ordered a CT for lung cancer screening Increase Crestor to 20 mg daily

## 2022-07-07 NOTE — Progress Notes (Signed)
Established Patient Office Visit  Subjective   Patient ID: Christopher [name], male    DOB: 01-28-1956  Age: 66 y.o. MRN: [id number]  Chief Complaint  Patient presents with   Follow-up    4 week f/u    Christopher [name] returns to care today.  He is a 66 male with past medical history significant for tobacco use, chronic musculoskeletal pain secondary to an extensive orthopedic surgical history, OSA, COPD, HLD, and depression.  He was last seen by me on 10/6 to establish care.  At that time he endorsed significant fatigue and shortness of breath.  Baseline labs were ordered and 4-week follow-up was arranged.  There have been no acute interval events.  Today Christopher [name] continues to endorse fatigue.  He states that his symptoms are essentially unchanged from 1 month ago.  He continues to endorse dyspnea with exertion as well.  Acute concerns, chronic medical conditions, and outstanding preventative care items discussed today are individually addressed in A/P below.  Past Medical History:  Diagnosis Date   Anxiety    Arthritis    Asthma    BPH (benign prostatic hyperplasia)    Complication of anesthesia    pt had a hard time being able to move after spinal anesthesia , 3-4 hours   Depression    GERD (gastroesophageal reflux disease)    Gout    no meds   Headache(784.0)    otc meds prn   Heart murmur    dx as a child, no problems as an adult   Hyperlipidemia    IBS (irritable bowel syndrome)    PONV (postoperative nausea and vomiting)    Pre-diabetes    Borderline, diet and exercise, no med   Sleep apnea    uses CIPAP machine at night   Wears partial dentures    bottom partial   Past Surgical History:  Procedure Laterality Date   APPENDECTOMY     BACK SURGERY  2002   neck and back fusion   BIOPSY  12/27/2015   Procedure: BIOPSY;  Surgeon: Rogene Houston, MD;  [facility];  Service: Endoscopy;;  Fundus biopsies and duodenal biopsies   BIOPSY  02/10/2020   Procedure:  BIOPSY;  Surgeon: Rogene Houston, MD;  [facility];  Service: Endoscopy;;  antral   CARDIAC CATHETERIZATION     CARDIAC CATHETERIZATION N/A 09/04/2016   Procedure: Right/Left Heart Cath and Coronary Angiography;  Surgeon: Peter M Martinique, MD;  Location: [place] [facility];  Service: Cardiovascular;  Laterality: N/A;   CHOLECYSTECTOMY     CHONDROPLASTY  08/15/2011   Procedure: CHONDROPLASTY;  Surgeon: [name], MD;  [facility];  Service: Orthopedics;  Laterality: Left;   COLONOSCOPY  06/27/2011   Procedure: COLONOSCOPY;  Surgeon: Rogene Houston, MD;  [facility];  Service: Endoscopy;  Laterality: N/A;  9:00 / Pt to be here at 9am for 10:45 procedure, benign polyps removed   COLONOSCOPY N/A 10/05/2014   Procedure: COLONOSCOPY;  Surgeon: Rogene Houston, MD;  [facility];  Service: Endoscopy;  Laterality: N/A;  930   COLONOSCOPY N/A 02/11/2018   Procedure: COLONOSCOPY;  Surgeon: Rogene Houston, MD;  [facility];  Service: Endoscopy;  Laterality: N/A;  830   ESOPHAGOGASTRODUODENOSCOPY N/A 12/27/2015   Procedure: ESOPHAGOGASTRODUODENOSCOPY (EGD);  Surgeon: Rogene Houston, MD;  [facility];  Service: Endoscopy;  Laterality: N/A;  3:00   ESOPHAGOGASTRODUODENOSCOPY (EGD) WITH PROPOFOL N/A  02/10/2020   Procedure: ESOPHAGOGASTRODUODENOSCOPY (EGD) WITH PROPOFOL;  Surgeon: Rogene Houston, MD;  [facility];  Service: Endoscopy;  Laterality: N/A;  155   HERNIA REPAIR     umbilical hernia   JOINT REPLACEMENT     knee and shoulder   KNEE ARTHROSCOPY     left knee   KNEE ARTHROSCOPY     right knee    LUMBAR LAMINECTOMY/DECOMPRESSION MICRODISCECTOMY  09/14/2012   Procedure: LUMBAR LAMINECTOMY/DECOMPRESSION MICRODISCECTOMY 1 LEVEL;  Surgeon: Floyce Stakes, MD;  Location: [place] [facility];  Service: Neurosurgery;  Laterality: Right;  Right Lumbar three-four Diskectomy   neck fusion  2005   PATELLA-FEMORAL ARTHROPLASTY  Right 02/18/2022   Procedure: RIGHT KNEE PATELLA REPLACEMENT, CEMENTED;  Surgeon: Meredith Pel, MD;  Location: [place];  Service: Orthopedics;  Laterality: Right;   POLYPECTOMY  02/11/2018   Procedure: POLYPECTOMY;  Surgeon: Rogene Houston, MD;  [facility];  Service: Endoscopy;;  colon   REVISION TOTAL SHOULDER TO REVERSE TOTAL SHOULDER Left 12/16/2018   Procedure: REVISION TOTAL SHOULDER TO REVERSE TOTAL SHOULDER;  Surgeon: Meredith Pel, MD;  Location: [place];  Service: Orthopedics;  Laterality: Left;   SHOULDER ARTHROSCOPY WITH BICEPSTENOTOMY Left 09/23/2013   Procedure: SHOULDER ARTHROSCOPY WITH BICEPSTENOTOMY AND EXTENSIVE DEBRIDEMENT;  Surgeon: Carole Civil, MD;  [facility];  Service: Orthopedics;  Laterality: Left;   SHOULDER ARTHROSCOPY WITH ROTATOR CUFF REPAIR Left 05/05/2014   Procedure: SHOULDER ARTHROSCOPY LIMITED DEBRIDEMENT;  Surgeon: Carole Civil, MD;  [facility];  Service: Orthopedics;  Laterality: Left;   SHOULDER OPEN ROTATOR CUFF REPAIR Left 05/05/2014   Procedure: ROTATOR CUFF REPAIR SHOULDER OPEN;  Surgeon: Carole Civil, MD;  [facility];  Service: Orthopedics;  Laterality: Left;   SHOULDER OPEN ROTATOR CUFF REPAIR Left 12/16/2018   Procedure: LEFT SHOULDER POSSIBLE SUBSCAPULARIS REPAIR VS. REVISION TO REVERSE TOTAL SHOULDER REPLACEMENT;  Surgeon: Meredith Pel, MD;  Location: [place];  Service: Orthopedics;  Laterality: Left;   SHOULDER SURGERY Right    Open Mumford procedure   SPINAL FUSION     x2, 2003 and 2007   TOTAL KNEE ARTHROPLASTY Left 06/16/2017   Procedure: LEFT TOTAL KNEE ARTHROPLASTY;  Surgeon: Carole Civil, MD;  [facility];  Service: Orthopedics;  Laterality: Left;   TOTAL KNEE ARTHROPLASTY Right 05/14/2021   Procedure: TOTAL KNEE ARTHROPLASTY;  Surgeon: Carole Civil, MD;  [facility];  Service: Orthopedics;  Laterality: Right;   TOTAL SHOULDER ARTHROPLASTY Left 08/19/2018    Procedure: left shoulder replacement;  Surgeon: Meredith Pel, MD;  Location: [place];  Service: Orthopedics;  Laterality: Left;   Social History   Tobacco Use   Smoking status: Every Day    Packs/day: 1.00    Years: 44.00    Total pack years: 44.00    Types: Cigarettes   Smokeless tobacco: Never   Tobacco comments:    smokes a pack a day. since age 63  Vaping Use   Vaping Use: Never used  Substance Use Topics   Alcohol use: No    Alcohol/week: 0.0 standard drinks of alcohol   Drug use: No   Family History  Problem Relation Age of Onset   Alzheimer's disease Father    Diabetes Father    Diabetes Other    Lung disease Other    Arthritis Other    Anesthesia problems Neg Hx    Hypotension Neg Hx    Malignant hyperthermia Neg  Hx    Pseudochol deficiency Neg Hx    Sleep apnea Neg Hx    Allergies  Allergen Reactions   Celebrex [Celecoxib] Itching, Swelling and Other (See Comments)    All over   Cortisone Swelling    SWELLING REACTION UNSPECIFIED    Doxycycline Swelling and Other (See Comments)    Made tongue turn black    Prednisone Swelling    SWELLING REACTION UNSPECIFIED    Relafen [Nabumetone] Swelling    SWELLING REACTION UNSPECIFIED    Codeine Nausea And Vomiting and Other (See Comments)    Extreme stomach pain. This includes anything with the derivative of codeine in it. (Does tolerate hydrocodone)    Aspirin Itching   Oxycodone-Acetaminophen Itching and Other (See Comments)    Can tolerate with benadryl       Review of Systems  Constitutional:  Positive for malaise/fatigue.  All other systems reviewed and are negative.     Objective:     BP 128/64 (BP Location: Right Arm, Cuff Size: Large)   Pulse 68   Ht _0  (1.676 m)   Wt 231 lb (104.8 kg)   SpO2 95%   BMI 37.28 kg/m  BP Readings from Last 3 Encounters:  07/07/22 128/64  06/06/22 138/66  02/19/22 111/63   Physical Exam Vitals reviewed.  Constitutional:      General: He is not  in acute distress.    Appearance: Normal appearance. He is obese. He is not ill-appearing.  HENT:     Head: Normocephalic and atraumatic.     Right Ear: External ear normal.     Left Ear: External ear normal.     Nose: Nose normal. No congestion or rhinorrhea.     Mouth/Throat:     Mouth: Mucous membranes are moist.     Pharynx: Oropharynx is clear.  Eyes:     Extraocular Movements: Extraocular movements intact.     Conjunctiva/sclera: Conjunctivae normal.     Pupils: Pupils are equal, round, and reactive to light.  Cardiovascular:     Rate and Rhythm: Normal rate and regular rhythm.     Pulses: Normal pulses.     Heart sounds: Normal heart sounds. No murmur heard. Pulmonary:     Effort: Pulmonary effort is normal.     Breath sounds: Normal breath sounds. No wheezing, rhonchi or rales.  Abdominal:     General: Abdomen is flat. Bowel sounds are normal. There is no distension.     Palpations: Abdomen is soft.     Tenderness: There is no abdominal tenderness.  Musculoskeletal:     Cervical back: Normal range of motion.     Right lower leg: No edema.     Left lower leg: No edema.  Skin:    General: Skin is warm and dry.     Capillary Refill: Capillary refill takes less than 2 seconds.  Neurological:     General: No focal deficit present.     Mental Status: He is alert and oriented to person, place, and time.     Motor: No weakness.  Psychiatric:        Mood and Affect: Mood normal.        Behavior: Behavior normal.        Thought Content: Thought content normal.    Last CBC Lab Results  Component Value Date   WBC 7.3 06/06/2022   HGB 13.4 06/06/2022   HCT 39.4 06/06/2022   MCV 83 06/06/2022   MCH 28.1 06/06/2022  RDW 14.5 06/06/2022   PLT 224 39/68/8648   Last metabolic panel Lab Results  Component Value Date   GLUCOSE 89 06/06/2022   NA 141 06/06/2022   K 4.1 06/06/2022   CL 102 06/06/2022   CO2 24 06/06/2022   BUN 13 06/06/2022   CREATININE 1.04 06/06/2022    EGFR 80 06/06/2022   CALCIUM 9.0 06/06/2022   PROT 6.3 06/06/2022   ALBUMIN 4.4 06/06/2022   LABGLOB 1.9 06/06/2022   AGRATIO 2.3 (H) 06/06/2022   BILITOT 0.3 06/06/2022   ALKPHOS 101 06/06/2022   AST 13 06/06/2022   ALT 18 06/06/2022   ANIONGAP 7 02/11/2022   Last lipids Lab Results  Component Value Date   CHOL 144 06/06/2022   HDL 38 (L) 06/06/2022   LDLCALC 59 06/06/2022   TRIG 298 (H) 06/06/2022   CHOLHDL 3.8 06/06/2022   Last hemoglobin A1c Lab Results  Component Value Date   HGBA1C 6.1 (H) 06/06/2022   Last thyroid functions Lab Results  Component Value Date   TSH 1.530 06/06/2022   Last vitamin B12 and Folate Lab Results  Component Value Date   VITAMINB12 632 06/06/2022   FOLATE 3.5 06/06/2022   The 10-year ASCVD risk score (Arnett DK, et al., 2019) is: 17.9%    Assessment & Plan:   Problem List Items Addressed This Visit       COPD (chronic obstructive pulmonary disease) ([place])    He continues to endorse dyspnea with exertion.  The frequency of albuterol inhaler use has recently increased.  His pulmonary exam is normal today. -I have placed referral to pulmonology today to establish care      Hypogonadism in male    He continues to endorse fatigue.  Free/total testosterone levels were significantly decreased on 2 occasions.  He reports previously being prescribed testosterone supplementation for a brief period of time but cannot elaborate on further details. -I have placed a referral to urology today for management of hypogonadism      Hyperlipidemia - Primary    Lipid panel updated last month.  His total cholesterol is 144, LDL 59.  His 10-year ASCVD risk score 17.9%.  He is currently prescribed Crestor 10 mg daily.  Through shared decision making, he is in agreement with increasing Crestor to 20 mg daily.      Depression, recurrent ([place])    Currently prescribed Zoloft.  His PHQ-9 score is elevated today (10).  He currently denies SI/HI.  He is  not interested in establishing care with psychiatry or counseling.  No medication changes today.      Screening for lung cancer    Current, everyday smoker with > 20 pack-year smoking history.  LDCT for lung cancer screening ordered today.      Return in about 6 weeks (around 08/18/2022).    Johnette Abraham, MD

## 2022-07-07 NOTE — Assessment & Plan Note (Signed)
Lipid panel updated last month.  His total cholesterol is 144, LDL 59.  His 10-year ASCVD risk score 17.9%.  He is currently prescribed Crestor 10 mg daily.  Through shared decision making, he is in agreement with increasing Crestor to 20 mg daily.

## 2022-07-07 NOTE — Assessment & Plan Note (Signed)
He continues to endorse dyspnea with exertion.  The frequency of albuterol inhaler use has recently increased.  His pulmonary exam is normal today. -I have placed referral to pulmonology today to establish care

## 2022-07-07 NOTE — Assessment & Plan Note (Signed)
Currently prescribed Zoloft.  His PHQ-9 score is elevated today (10).  He currently denies SI/HI.  He is not interested in establishing care with psychiatry or counseling.  No medication changes today.

## 2022-07-07 NOTE — Assessment & Plan Note (Signed)
He continues to endorse fatigue.  Free/total testosterone levels were significantly decreased on 2 occasions.  He reports previously being prescribed testosterone supplementation for a brief period of time but cannot elaborate on further details. -I have placed a referral to urology today for management of hypogonadism

## 2022-07-07 NOTE — Assessment & Plan Note (Signed)
Current, everyday smoker with > 20 pack-year smoking history.  LDCT for lung cancer screening ordered today.

## 2022-07-08 ENCOUNTER — Telehealth: Payer: Self-pay | Admitting: Internal Medicine

## 2022-07-08 NOTE — Telephone Encounter (Signed)
Lvm to call back for CT appt info----Nov 21 10:15am at [facility]

## 2022-07-08 NOTE — Telephone Encounter (Signed)
Pt aware.

## 2022-07-11 ENCOUNTER — Ambulatory Visit: Payer: PPO | Admitting: Urology

## 2022-07-11 VITALS — BP 168/82 | HR 68 | Ht 66.0 in | Wt 231.0 lb

## 2022-07-11 DIAGNOSIS — N401 Enlarged prostate with lower urinary tract symptoms: Secondary | ICD-10-CM | POA: Diagnosis not present

## 2022-07-11 DIAGNOSIS — E291 Testicular hypofunction: Secondary | ICD-10-CM

## 2022-07-11 DIAGNOSIS — R350 Frequency of micturition: Secondary | ICD-10-CM

## 2022-07-11 DIAGNOSIS — N138 Other obstructive and reflux uropathy: Secondary | ICD-10-CM

## 2022-07-11 LAB — URINALYSIS, ROUTINE W REFLEX MICROSCOPIC
Bilirubin, UA: NEGATIVE
Glucose, UA: NEGATIVE
Ketones, UA: NEGATIVE
Leukocytes,UA: NEGATIVE
Nitrite, UA: NEGATIVE
Protein,UA: NEGATIVE
Specific Gravity, UA: 1.02 (ref 1.005–1.030)
Urobilinogen, Ur: 0.2 mg/dL (ref 0.2–1.0)
pH, UA: 5.5 (ref 5.0–7.5)

## 2022-07-11 LAB — MICROSCOPIC EXAMINATION
Bacteria, UA: NONE SEEN
Epithelial Cells (non renal): NONE SEEN /hpf (ref 0–10)

## 2022-07-11 NOTE — Progress Notes (Unsigned)
07/11/2022 8:58 AM   Christopher [name] 15-Feb-1956 [id number]  Referring provider: Johnette Abraham, MD [place],  [place] [address]  hypogonadism  HPI: [name] is a 66yo here for evaluation of hypogonadism. He has had 2 testosterone levels in the past month, 131 and 95. He has been fatigued for over 1 year. He has a hx of vasectomy. IPSS 12 QOL 1 on no BPH medications. He was previously on flomax 0.'4mg'$  but stopped the medication since his LUTS improved.    PMH: Past Medical History:  Diagnosis Date   Anxiety    Arthritis    Asthma    BPH (benign prostatic hyperplasia)    Complication of anesthesia    pt had a hard time being able to move after spinal anesthesia , 3-4 hours   Depression    GERD (gastroesophageal reflux disease)    Gout    no meds   Headache(784.0)    otc meds prn   Heart murmur    dx as a child, no problems as an adult   Hyperlipidemia    IBS (irritable bowel syndrome)    PONV (postoperative nausea and vomiting)    Pre-diabetes    Borderline, diet and exercise, no med   Sleep apnea    uses CIPAP machine at night   Wears partial dentures    bottom partial    Surgical History: Past Surgical History:  Procedure Laterality Date   APPENDECTOMY     BACK SURGERY  2002   neck and back fusion   BIOPSY  12/27/2015   Procedure: BIOPSY;  Surgeon: Rogene Houston, MD;  [facility];  Service: Endoscopy;;  Fundus biopsies and duodenal biopsies   BIOPSY  02/10/2020   Procedure: BIOPSY;  Surgeon: Rogene Houston, MD;  [facility];  Service: Endoscopy;;  antral   CARDIAC CATHETERIZATION     CARDIAC CATHETERIZATION N/A 09/04/2016   Procedure: Right/Left Heart Cath and Coronary Angiography;  Surgeon: Peter M Martinique, MD;  Location: [facility];  Service: Cardiovascular;  Laterality: N/A;   CHOLECYSTECTOMY     CHONDROPLASTY  08/15/2011   Procedure: CHONDROPLASTY;  Surgeon: [name], MD;  [facility];  Service: Orthopedics;  Laterality: Left;   COLONOSCOPY  06/27/2011   Procedure: COLONOSCOPY;  Surgeon: Rogene Houston, MD;  [facility];  Service: Endoscopy;  Laterality: N/A;  9:00 / Pt to be here at 9am for 10:45 procedure, benign polyps removed   COLONOSCOPY N/A 10/05/2014   Procedure: COLONOSCOPY;  Surgeon: Rogene Houston, MD;  [facility];  Service: Endoscopy;  Laterality: N/A;  930   COLONOSCOPY N/A 02/11/2018   Procedure: COLONOSCOPY;  Surgeon: Rogene Houston, MD;  [facility];  Service: Endoscopy;  Laterality: N/A;  830   ESOPHAGOGASTRODUODENOSCOPY N/A 12/27/2015   Procedure: ESOPHAGOGASTRODUODENOSCOPY (EGD);  Surgeon: Rogene Houston, MD;  [facility];  Service: Endoscopy;  Laterality: N/A;  3:00   ESOPHAGOGASTRODUODENOSCOPY (EGD) WITH PROPOFOL N/A 02/10/2020   Procedure: ESOPHAGOGASTRODUODENOSCOPY (EGD) WITH PROPOFOL;  Surgeon: Rogene Houston, MD;  [facility];  Service: Endoscopy;  Laterality: N/A;  155   HERNIA REPAIR     umbilical hernia   JOINT REPLACEMENT     knee and shoulder   KNEE ARTHROSCOPY     left knee   KNEE ARTHROSCOPY     right knee    LUMBAR LAMINECTOMY/DECOMPRESSION MICRODISCECTOMY  09/14/2012   Procedure: LUMBAR LAMINECTOMY/DECOMPRESSION MICRODISCECTOMY 1 LEVEL;  Surgeon: Floyce Stakes, MD;  Location: [facility];  Service: Neurosurgery;  Laterality: Right;  Right Lumbar three-four Diskectomy   neck fusion  2005   PATELLA-FEMORAL ARTHROPLASTY Right 02/18/2022   Procedure: RIGHT KNEE PATELLA REPLACEMENT, CEMENTED;  Surgeon: Meredith Pel, MD;  Location: [facility];  Service: Orthopedics;  Laterality: Right;   POLYPECTOMY  02/11/2018   Procedure: POLYPECTOMY;  Surgeon: Rogene Houston, MD;  [facility];  Service: Endoscopy;;  colon   REVISION TOTAL SHOULDER TO REVERSE TOTAL SHOULDER Left 12/16/2018   Procedure: REVISION TOTAL SHOULDER TO REVERSE TOTAL SHOULDER;  Surgeon: Meredith Pel, MD;  Location: [facility];  Service: Orthopedics;  Laterality: Left;   SHOULDER ARTHROSCOPY WITH BICEPSTENOTOMY Left 09/23/2013   Procedure: SHOULDER ARTHROSCOPY WITH BICEPSTENOTOMY AND EXTENSIVE DEBRIDEMENT;  Surgeon: Carole Civil, MD;  [facility];  Service: Orthopedics;  Laterality: Left;   SHOULDER ARTHROSCOPY WITH ROTATOR CUFF REPAIR Left 05/05/2014   Procedure: SHOULDER ARTHROSCOPY LIMITED DEBRIDEMENT;  Surgeon: Carole Civil, MD;  [facility];  Service: Orthopedics;  Laterality: Left;   SHOULDER OPEN ROTATOR CUFF REPAIR Left 05/05/2014   Procedure: ROTATOR CUFF REPAIR SHOULDER OPEN;  Surgeon: Carole Civil, MD;  [facility];  Service: Orthopedics;  Laterality: Left;   SHOULDER OPEN ROTATOR CUFF REPAIR Left 12/16/2018   Procedure: LEFT SHOULDER POSSIBLE SUBSCAPULARIS REPAIR VS. REVISION TO REVERSE TOTAL SHOULDER REPLACEMENT;  Surgeon: Meredith Pel, MD;  Location: [facility];  Service: Orthopedics;  Laterality: Left;   SHOULDER SURGERY Right    Open Mumford procedure   SPINAL FUSION     x2, 2003 and 2007   TOTAL KNEE ARTHROPLASTY Left 06/16/2017   Procedure: LEFT TOTAL KNEE ARTHROPLASTY;  Surgeon: Carole Civil, MD;  [facility];  Service: Orthopedics;  Laterality: Left;   TOTAL KNEE ARTHROPLASTY Right 05/14/2021   Procedure: TOTAL KNEE ARTHROPLASTY;  Surgeon: Carole Civil, MD;  [facility];  Service: Orthopedics;  Laterality: Right;   TOTAL SHOULDER ARTHROPLASTY Left 08/19/2018   Procedure: left shoulder replacement;  Surgeon: Meredith Pel, MD;  Location: [facility];  Service: Orthopedics;  Laterality: Left;    Home Medications:  Allergies as of 07/11/2022       Reactions   Celebrex [celecoxib] Itching, Swelling, Other (See Comments)   All over   Cortisone Swelling   SWELLING REACTION UNSPECIFIED    Doxycycline Swelling, Other (See Comments)   Made tongue turn black    Prednisone Swelling   SWELLING REACTION  UNSPECIFIED    Relafen [nabumetone] Swelling   SWELLING REACTION UNSPECIFIED    Codeine Nausea And Vomiting, Other (See Comments)   Extreme stomach pain. This includes anything with the derivative of codeine in it. (Does tolerate hydrocodone)    Aspirin Itching   Oxycodone-acetaminophen Itching, Other (See Comments)   Can tolerate with benadryl         Medication List        Accurate as of July 11, 2022  8:58 AM. If you have any questions, ask your nurse or doctor.          acetaminophen 500 MG tablet Commonly known as: TYLENOL Take 500-1,000 mg by mouth every 6 (six) hours as needed for moderate pain.   cyclobenzaprine 10 MG tablet Commonly known as: FLEXERIL Take 1 tablet (10 mg total) by mouth 3 (three) times daily as needed for muscle spasms. Do not take with  Robaxin   EPINEPHrine 0.3 mg/0.3 mL Soaj injection Commonly known as: EPI-PEN Inject 0.3 mg into the muscle as needed for anaphylaxis.   gabapentin 100 MG capsule Commonly known as: NEURONTIN Take 1 capsule (100 mg total) by mouth 3 (three) times daily as needed.   ICY HOT EX Apply 1 application topically daily as needed (pain).   nitroGLYCERIN 0.4 MG SL tablet Commonly known as: NITROSTAT PLACE (1) TABLET UNDER TONGUE EVERY 5 MINUTES UP TO (3) DOSES. IF NO RELIEF CALL 911.   pantoprazole 40 MG tablet Commonly known as: PROTONIX TAKE (1) TABLET TWICE A DAY BEFORE MEALS. What changed: See the new instructions.   pregabalin 50 MG capsule Commonly known as: LYRICA Take 1 capsule (50 mg total) by mouth 2 (two) times daily. What changed:  when to take this reasons to take this   ProAir HFA 108 (90 Base) MCG/ACT inhaler Generic drug: albuterol Inhale 2 puffs into the lungs every 6 (six) hours as needed for wheezing or shortness of breath.   rosuvastatin 20 MG tablet Commonly known as: Crestor Take 1 tablet (20 mg total) by mouth daily.   sertraline 50 MG tablet Commonly known as: ZOLOFT Take  50 mg by mouth in the morning.        Allergies:  Allergies  Allergen Reactions   Celebrex [Celecoxib] Itching, Swelling and Other (See Comments)    All over   Cortisone Swelling    SWELLING REACTION UNSPECIFIED    Doxycycline Swelling and Other (See Comments)    Made tongue turn black    Prednisone Swelling    SWELLING REACTION UNSPECIFIED    Relafen [Nabumetone] Swelling    SWELLING REACTION UNSPECIFIED    Codeine Nausea And Vomiting and Other (See Comments)    Extreme stomach pain. This includes anything with the derivative of codeine in it. (Does tolerate hydrocodone)    Aspirin Itching   Oxycodone-Acetaminophen Itching and Other (See Comments)    Can tolerate with benadryl     Family History: Family History  Problem Relation Age of Onset   Alzheimer's disease Father    Diabetes Father    Diabetes Other    Lung disease Other    Arthritis Other    Anesthesia problems Neg Hx    Hypotension Neg Hx    Malignant hyperthermia Neg Hx    Pseudochol deficiency Neg Hx    Sleep apnea Neg Hx     Social History:  reports that he has been smoking cigarettes. He has a 44.00 pack-year smoking history. He has never used smokeless tobacco. He reports that he does not drink alcohol and does not use drugs.  ROS: All other review of systems were reviewed and are negative except what is noted above in HPI  Physical Exam: BP (!) 168/82   Pulse 68   Ht '5\' 6"'$  (1.676 m)   Wt 231 lb (104.8 kg)   BMI 37.28 kg/m   Constitutional:  Alert and oriented, No acute distress. HEENT: [place] AT, moist mucus membranes.  Trachea midline, no masses. Cardiovascular: No clubbing, cyanosis, or edema. Respiratory: Normal respiratory effort, no increased work of breathing. GI: Abdomen is soft, nontender, nondistended, no abdominal masses GU: No CVA tenderness.  Lymph: No cervical or inguinal lymphadenopathy. Skin: No rashes, bruises or suspicious lesions. Neurologic: Grossly intact, no focal deficits,  moving all 4 extremities. Psychiatric: Normal mood and affect.  Laboratory Data: Lab Results  Component Value Date   WBC 7.3 06/06/2022   HGB 13.4 06/06/2022  HCT 39.4 06/06/2022   MCV 83 06/06/2022   PLT 224 06/06/2022    Lab Results  Component Value Date   CREATININE 1.04 06/06/2022    No results found for: "PSA"  Lab Results  Component Value Date   TESTOSTERONE 95 (L) 06/10/2022    Lab Results  Component Value Date   HGBA1C 6.1 (H) 06/06/2022    Urinalysis    Component Value Date/Time   COLORURINE YELLOW 02/11/2022 1008   APPEARANCEUR CLEAR 02/11/2022 1008   APPEARANCEUR Clear 11/16/2020 0905   LABSPEC 1.012 02/11/2022 1008   PHURINE 6.0 02/11/2022 1008   GLUCOSEU NEGATIVE 02/11/2022 1008   HGBUR NEGATIVE 02/11/2022 1008   BILIRUBINUR NEGATIVE 02/11/2022 1008   BILIRUBINUR Negative 11/16/2020 0905   KETONESUR NEGATIVE 02/11/2022 1008   PROTEINUR NEGATIVE 02/11/2022 1008   NITRITE NEGATIVE 02/11/2022 1008   LEUKOCYTESUR NEGATIVE 02/11/2022 1008    Lab Results  Component Value Date   LABMICR Comment 11/16/2020   WBCUA None seen 10/03/2020   LABEPIT None seen 10/03/2020   BACTERIA NONE SEEN 02/11/2022    Pertinent Imaging:  Results for orders placed during the hospital encounter of 02/19/10  DG Abd 1 View  Narrative Clinical Data: Vomiting and abdominal pain.  Status post lumbar spine surgery 2 days ago.  ABDOMEN - 1 VIEW  Comparison: None.  Findings: Mild gaseous distention of the colon with scattered air fluid levels, including the rectum.  Surgical clips in the right upper and lower abdomen.  Laminectomy defects, interbody spacer and pedicle screw and rod fixation at the L4-5 level.  Minimal dextroconvex lumbar scoliosis.  IMPRESSION: Mild colonic ileus.  Provider: Rober Minion  No results found for this or any previous visit.  No results found for this or any previous visit.  No results found for this or any previous visit.  No  results found for this or any previous visit.  No valid procedures specified. No results found for this or any previous visit.  No results found for this or any previous visit.   Assessment & Plan:    1. Hypogonadism in male -testosterone labs, will call with results  2. Benign prostatic hyperplasia with urinary obstruction Patient defers therapy at this time  3. Frequency of micturition Patient defers therapy at this time   No follow-ups on file.  Nicolette Bang, MD  [facility]

## 2022-07-15 LAB — PSA: Prostate Specific Ag, Serum: 2.3 ng/mL (ref 0.0–4.0)

## 2022-07-15 LAB — PROLACTIN: Prolactin: 8.3 ng/mL (ref 4.0–15.2)

## 2022-07-15 LAB — TESTOSTERONE,FREE AND TOTAL
Testosterone, Free: 1.3 pg/mL — ABNORMAL LOW (ref 6.6–18.1)
Testosterone: 83 ng/dL — ABNORMAL LOW (ref 264–916)

## 2022-07-15 LAB — ESTRADIOL: Estradiol: 8.6 pg/mL (ref 7.6–42.6)

## 2022-07-17 ENCOUNTER — Encounter: Payer: Self-pay | Admitting: Urology

## 2022-07-17 NOTE — Patient Instructions (Signed)

## 2022-07-22 ENCOUNTER — Ambulatory Visit (HOSPITAL_COMMUNITY)
Admission: RE | Admit: 2022-07-22 | Discharge: 2022-07-22 | Disposition: A | Discharge status: Home / Self Care | Payer: PPO | Source: Ambulatory Visit | Attending: Internal Medicine | Admitting: Internal Medicine

## 2022-07-22 DIAGNOSIS — I700 Atherosclerosis of aorta: Secondary | ICD-10-CM | POA: Insufficient documentation

## 2022-07-22 DIAGNOSIS — F17210 Nicotine dependence, cigarettes, uncomplicated: Secondary | ICD-10-CM | POA: Diagnosis not present

## 2022-07-22 DIAGNOSIS — K760 Fatty (change of) liver, not elsewhere classified: Secondary | ICD-10-CM | POA: Diagnosis not present

## 2022-07-22 DIAGNOSIS — J439 Emphysema, unspecified: Secondary | ICD-10-CM | POA: Diagnosis not present

## 2022-07-22 DIAGNOSIS — Z122 Encounter for screening for malignant neoplasm of respiratory organs: Secondary | ICD-10-CM | POA: Insufficient documentation

## 2022-07-22 DIAGNOSIS — Z720 Tobacco use: Secondary | ICD-10-CM | POA: Insufficient documentation

## 2022-07-31 NOTE — Progress Notes (Signed)
Letter sent.

## 2022-08-07 ENCOUNTER — Other Ambulatory Visit: Payer: Self-pay | Admitting: Neurosurgery

## 2022-08-07 DIAGNOSIS — M5440 Lumbago with sciatica, unspecified side: Secondary | ICD-10-CM

## 2022-08-07 DIAGNOSIS — M5412 Radiculopathy, cervical region: Secondary | ICD-10-CM

## 2022-08-11 ENCOUNTER — Other Ambulatory Visit: Payer: Self-pay | Admitting: Radiology

## 2022-08-11 MED ORDER — METHYLPREDNISOLONE 4 MG PO TBPK: ORAL_TABLET | ORAL | 0 refills | Status: DC

## 2022-08-11 NOTE — Telephone Encounter (Signed)
I called him to see why he needs prednisone refilled, he states he saw Dr Saintclair Halsted for his fusion in his spine and has headache. Has MRI scheduled soon for the cervical spine. He thought Dr Saintclair Halsted was going to send it in, but he didn't pharmacy sent refill request here, wife states no reply from Dr Saintclair Halsted office, would you be willing to refill the prednisone for him for his neck / and he will follow up soon with Dr Saintclair Halsted as scheduled for after the MRI

## 2022-08-11 NOTE — Telephone Encounter (Signed)
Refill request received via fax for  4 mg 6 day prednisone taper

## 2022-08-21 ENCOUNTER — Ambulatory Visit (INDEPENDENT_AMBULATORY_CARE_PROVIDER_SITE_OTHER): Payer: PPO | Admitting: Internal Medicine

## 2022-08-21 ENCOUNTER — Encounter: Payer: Self-pay | Admitting: Internal Medicine

## 2022-08-21 DIAGNOSIS — J988 Other specified respiratory disorders: Secondary | ICD-10-CM | POA: Diagnosis not present

## 2022-08-21 DIAGNOSIS — Z20828 Contact with and (suspected) exposure to other viral communicable diseases: Secondary | ICD-10-CM | POA: Diagnosis not present

## 2022-08-21 DIAGNOSIS — B9789 Other viral agents as the cause of diseases classified elsewhere: Secondary | ICD-10-CM

## 2022-08-21 MED ORDER — OSELTAMIVIR PHOSPHATE 75 MG PO CAPS: 75.0000 mg | ORAL_CAPSULE | Freq: Two times a day (BID) | ORAL | 0 refills | Status: AC

## 2022-08-21 NOTE — Progress Notes (Signed)
   Acute Telephone Visit  Virtual Visit via Telephone Note  I connected with Christopher [name] on 08/21/22 at 11:00 AM EST by telephone and verified that I am speaking with the correct person using two identifiers.  Location: Patient: Christopher [name]., [place], [name] [address] Provider: [phone] [address] [place], [place] [address]   I discussed the limitations, risks, security and privacy concerns of performing an evaluation and management service by telephone and the availability of in person appointments. I also discussed with the patient that there may be a patient responsible charge related to this service. The patient expressed understanding and agreed to proceed.  Chief Complaint  Patient presents with   Cough   History of Present Illness:  Christopher [name] has been evaluated through an acute telephone encounter today for 3-day history of upper respiratory symptoms.  He endorses 3-day history of persistent coughing, chest congestion, throat irritation subjective fever/chills.  He also had an episode of nausea with vomiting 2 days ago.  His coughing keeps him awake at night and has not improved with over-the-counter cough medications.  When he has been able to produce sputum it is cloudy/yellow color.  He has tried taking NyQuil for symptom relief.  Of note, Christopher [name] reports that his son tested positive for influenza 2 days ago.  Christopher [name] did receive his influenza vaccine this year (06/06/2022)  Assessment and Plan:  Viral respiratory illness Influenza exposure Evaluated today through acute telephone encounter for the symptoms endorsed above.  His son has recently tested positive for influenza and had similar symptoms. -COVID/influenza testing ordered today -Given that his symptoms are currently getting worse when he has been exposed to influenza and his increased risk for complications, I have prescribed Tamiflu 75 mg twice daily today x 5 days.  I have instructed him not to take this medication  if his influenza test is negative.  Follow Up Instructions:  I discussed the assessment and treatment plan with the patient. The patient was provided an opportunity to ask questions and all were answered. The patient agreed with the plan and demonstrated an understanding of the instructions.   The patient was advised to call back or seek an in-person evaluation if the symptoms worsen or if the condition fails to improve as anticipated.  I provided 7 minutes of non-face-to-face time during this encounter.   Johnette Abraham, MD

## 2022-08-22 ENCOUNTER — Telehealth: Payer: Self-pay | Admitting: Internal Medicine

## 2022-08-22 NOTE — Telephone Encounter (Signed)
Called in for lab results

## 2022-08-22 NOTE — Telephone Encounter (Signed)
Spoke with patients wife and advised we have not gotten results back.

## 2022-08-23 LAB — COVID-19, FLU A+B NAA
Influenza A, NAA: DETECTED — AB
Influenza B, NAA: NOT DETECTED
SARS-CoV-2, NAA: NOT DETECTED

## 2022-08-27 ENCOUNTER — Encounter: Payer: Self-pay | Admitting: Internal Medicine

## 2022-08-27 ENCOUNTER — Telehealth: Payer: Self-pay | Admitting: Internal Medicine

## 2022-08-27 DIAGNOSIS — Z720 Tobacco use: Secondary | ICD-10-CM

## 2022-08-27 MED ORDER — NICOTINE 21 MG/24HR TD PT24: 21.0000 mg | MEDICATED_PATCH | Freq: Every day | TRANSDERMAL | 0 refills | Status: AC

## 2022-08-27 NOTE — Telephone Encounter (Signed)
Spouse came by office on patient behalf.  Patient is requesting Nicotine patches to be sent in to [facility]    Patient wants a call back in regard

## 2022-08-27 NOTE — Telephone Encounter (Signed)
Nicoderm patches 21 mg/24 hr prescribed x 6 week. Rx sent to [facility].

## 2022-08-27 NOTE — Telephone Encounter (Signed)
Spoke with patient's wife.

## 2022-08-28 ENCOUNTER — Ambulatory Visit
Admission: RE | Admit: 2022-08-28 | Discharge: 2022-08-28 | Disposition: A | Discharge status: Home / Self Care | Payer: PPO | Source: Ambulatory Visit | Attending: Neurosurgery | Admitting: Neurosurgery

## 2022-08-28 DIAGNOSIS — M5440 Lumbago with sciatica, unspecified side: Secondary | ICD-10-CM

## 2022-08-28 DIAGNOSIS — M5412 Radiculopathy, cervical region: Secondary | ICD-10-CM

## 2022-09-01 DIAGNOSIS — Z8616 Personal history of COVID-19: Secondary | ICD-10-CM

## 2022-09-01 HISTORY — DX: Personal history of COVID-19: Z86.16

## 2022-09-02 ENCOUNTER — Other Ambulatory Visit: Payer: Self-pay | Admitting: Internal Medicine

## 2022-09-09 ENCOUNTER — Telehealth: Payer: Self-pay

## 2022-09-09 NOTE — Telephone Encounter (Signed)
Returned call to wife and addressed all questions.

## 2022-09-09 NOTE — Telephone Encounter (Signed)
Patient needing latest lab results.  Call back: [phone]   Thanks, Helene Kelp

## 2022-09-10 ENCOUNTER — Institutional Professional Consult (permissible substitution): Payer: PPO | Admitting: Pulmonary Disease

## 2022-09-11 ENCOUNTER — Telehealth: Payer: Self-pay | Admitting: Neurology

## 2022-09-11 ENCOUNTER — Telehealth: Payer: Self-pay | Admitting: Internal Medicine

## 2022-09-11 DIAGNOSIS — G4733 Obstructive sleep apnea (adult) (pediatric): Secondary | ICD-10-CM

## 2022-09-11 NOTE — Telephone Encounter (Signed)
Advised to contact neuro office, if pt has any issues was told to contact us back.

## 2022-09-11 NOTE — Telephone Encounter (Signed)
Patient needs new script to pick up  new cpap mask  Christopher [name] Fax # [phone]  Wants a call back when sent in.

## 2022-09-11 NOTE — Telephone Encounter (Signed)
Mask order for PAP machine signed.

## 2022-09-11 NOTE — Telephone Encounter (Signed)
Last seen 11/07/2021. Order for new mask pending approval with Dr Rexene Alberts.

## 2022-09-11 NOTE — Telephone Encounter (Signed)
I have tried to call the wife back 5 times on two different numbers (home and the number she called from). The phone line was disconnected three times on one and the other rang multiple times and then gave message that call could not be completed. Pt doesn't have mychart. I have faxed the order to [facility] (dme). Received a receipt of confirmation. They should follow-up with the patient.

## 2022-09-11 NOTE — Telephone Encounter (Signed)
[name] called stating that they are needing an order sent to the DME for a new mask due to the old one coming apart. Please advise.

## 2022-09-17 ENCOUNTER — Encounter: Payer: Self-pay | Admitting: Family Medicine

## 2022-09-17 ENCOUNTER — Ambulatory Visit (INDEPENDENT_AMBULATORY_CARE_PROVIDER_SITE_OTHER): Payer: PPO | Admitting: Family Medicine

## 2022-09-17 ENCOUNTER — Other Ambulatory Visit: Payer: Self-pay

## 2022-09-17 VITALS — BP 136/78 | HR 66 | Ht 66.0 in | Wt 236.0 lb

## 2022-09-17 DIAGNOSIS — R9431 Abnormal electrocardiogram [ECG] [EKG]: Secondary | ICD-10-CM

## 2022-09-17 DIAGNOSIS — Z0001 Encounter for general adult medical examination with abnormal findings: Secondary | ICD-10-CM | POA: Diagnosis not present

## 2022-09-17 DIAGNOSIS — Z0000 Encounter for general adult medical examination without abnormal findings: Secondary | ICD-10-CM

## 2022-09-17 NOTE — Progress Notes (Addendum)
Annual Wellness Visit     Patient: Christopher [name], Male    DOB: February 25, 1956, 67 y.o.   MRN: [id number]  Subjective  Chief Complaint  Patient presents with   Annual Exam    Christopher [name] is a 67 y.o. male who presents today for his Annual Wellness Visit. He reports consuming a general diet.  Patient rides a stationary bike weekly.  He generally feels well. He reports sleeping poorly sleeps with Bipap He does not have additional problems to discuss today.   HPI  Vision:Within last year   Patient Active Problem List   Diagnosis Date Noted   Abnormal EKG 09/18/2022   Hypogonadism in male 07/07/2022   Screening for lung cancer 07/07/2022   Depression, recurrent ([place]) 06/06/2022   Chronic musculoskeletal pain 06/06/2022   Fatigue 06/06/2022   Current tobacco use 06/06/2022   Encounter to establish care 06/06/2022   S/P right knee surgery 02/18/2022   S/P TKR (total knee replacement), right 05/14/21 07/02/2021   Status post total right knee replacement 05/14/2021   Primary osteoarthritis of right knee    Renal cyst 10/03/2020   Frequency of micturition 10/03/2020   Benign prostatic hyperplasia with urinary obstruction 10/03/2020   IBS (irritable bowel syndrome) 07/05/2020   Lumbar disc herniation 06/29/2019   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[place])    Primary osteoarthritis, left shoulder    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   History of colonic polyps 11/26/2017   S/P total knee replacement, left 06/16/17 06/16/2017   Primary osteoarthritis of left knee    Hyperlipidemia 09/04/2016   Angina pectoris ([place]) 09/04/2016   OSA (obstructive sleep apnea) 09/04/2016   COPD (chronic obstructive pulmonary disease) ([place]) 09/04/2016   Rotator cuff tear 05/05/2014   S/P shoulder surgery 09/26/2013   Arthritis, shoulder region 09/26/2013   Bursitis, shoulder 09/26/2013   Synovitis of shoulder 09/26/2013   Labral tear of shoulder, degenerative 09/26/2013   Biceps tendon tear  09/26/2013   Rotator cuff syndrome of left shoulder 06/21/2013   Arthritis 06/21/2013   Patellofemoral arthritis of right knee 03/29/2013   Effusion of knee joint 03/29/2013   Bursitis/tendonitis, shoulder 03/10/2013   Effusion of knee joint, left 08/18/2011   Knee pain 08/18/2011   Acute torn meniscus 07/30/2011   Old torn meniscus of knee 07/30/2011   ARTHRITIS, LEFT KNEE 10/15/2010   MEDIAL MENISCUS TEAR, RIGHT 10/15/2010   HIP PAIN 01/15/2010   DEGENERATIVE DISC DISEASE, LUMBOSACRAL SPINE W/RADICULOPATHY 40/98/1191   PLICA SYNDROME 47/82/9562   DERANGEMENT MENISCUS 07/09/2009   JOINT EFFUSION, LEFT KNEE 07/09/2009   Unilateral primary osteoarthritis, left knee 01/30/2009   KNEE PAIN 01/30/2009   ANKLE SPRAIN, RIGHT 11/08/2007      Medications: Outpatient Medications Prior to Visit  Medication Sig   acetaminophen (TYLENOL) 500 MG tablet Take 500-1,000 mg by mouth every 6 (six) hours as needed for moderate pain.   cyclobenzaprine (FLEXERIL) 10 MG tablet Take 1 tablet (10 mg total) by mouth 3 (three) times daily as needed for muscle spasms. Do not take with Robaxin   EPINEPHrine 0.3 mg/0.3 mL IJ SOAJ injection Inject 0.3 mg into the muscle as needed for anaphylaxis.   gabapentin (NEURONTIN) 100 MG capsule Take 1 capsule (100 mg total) by mouth 3 (three) times daily as needed.   Menthol, Topical Analgesic, (ICY HOT EX) Apply 1 application topically daily as needed (pain).   methylPREDNISolone (MEDROL DOSEPAK) 4 MG TBPK tablet Take as directed on the dosepack   nicotine (NICODERM CQ -  DOSED IN MG/24 HOURS) 21 mg/24hr patch Place 1 patch (21 mg total) onto the skin daily.   nitroGLYCERIN (NITROSTAT) 0.4 MG SL tablet PLACE (1) TABLET UNDER TONGUE EVERY 5 MINUTES UP TO (3) DOSES. IF NO RELIEF CALL 911.   pantoprazole (PROTONIX) 40 MG tablet TAKE (1) TABLET TWICE A DAY BEFORE MEALS. (Patient taking differently: Take 40 mg by mouth daily.)   pregabalin (LYRICA) 50 MG capsule Take 1  capsule (50 mg total) by mouth 2 (two) times daily. (Patient taking differently: Take 50 mg by mouth 2 (two) times daily as needed (pain).)   PROAIR HFA 108 (90 Base) MCG/ACT inhaler Inhale 2 puffs into the lungs every 6 (six) hours as needed for wheezing or shortness of breath.    rosuvastatin (CRESTOR) 20 MG tablet Take 1 tablet (20 mg total) by mouth daily.   sertraline (ZOLOFT) 50 MG tablet Take 50 mg by mouth in the morning.   No facility-administered medications prior to visit.    Allergies  Allergen Reactions   Celebrex [Celecoxib] Itching, Swelling and Other (See Comments)    All over   Cortisone Swelling    SWELLING REACTION UNSPECIFIED    Doxycycline Swelling and Other (See Comments)    Made tongue turn black    Prednisone Swelling    SWELLING REACTION UNSPECIFIED    Relafen [Nabumetone] Swelling    SWELLING REACTION UNSPECIFIED    Codeine Nausea And Vomiting and Other (See Comments)    Extreme stomach pain. This includes anything with the derivative of codeine in it. (Does tolerate hydrocodone)    Aspirin Itching   Oxycodone-Acetaminophen Itching and Other (See Comments)    Can tolerate with benadryl     Patient Care Team: Johnette Abraham, MD as PCP - General (Internal Medicine) Marlou Sa, Tonna Corner, MD as Consulting Physician (Orthopedic Surgery)  Review of Systems  Constitutional:  Negative for chills and fever.  Cardiovascular:  Negative for chest pain, palpitations, orthopnea and leg swelling.        Objective  BP 136/78   Pulse 66   Ht '5\' 6"'$  (1.676 m)   Wt 236 lb (107 kg)   SpO2 94%   BMI 38.09 kg/m  BP Readings from Last 3 Encounters:  09/17/22 136/78  07/11/22 (!) 168/82  07/07/22 128/64      Physical Exam Vitals reviewed.  Constitutional:      General: He is not in acute distress. Cardiovascular:     Rate and Rhythm: Normal rate.     Pulses: Normal pulses.  Pulmonary:     Effort: Pulmonary effort is normal. No respiratory distress.      Breath sounds: Normal breath sounds.  Skin:    Capillary Refill: Capillary refill takes less than 2 seconds.  Neurological:     General: No focal deficit present.     Mental Status: He is alert.     Coordination: Coordination normal.     Gait: Gait normal.  Psychiatric:        Mood and Affect: Mood normal.       Most recent functional status assessment:    09/17/2022    1:22 PM  In your present state of health, do you have any difficulty performing the following activities:  Hearing? 1  Vision? 0  Difficulty concentrating or making decisions? 0  Walking or climbing stairs? 0  Dressing or bathing? 0  Doing errands, shopping? 0  Preparing Food and eating ? N  Using the Toilet? N  In the  past six months, have you accidently leaked urine? N  Do you have problems with loss of bowel control? N  Managing your Medications? N  Managing your Finances? N  Housekeeping or managing your Housekeeping? N   Most recent fall risk assessment:    09/17/2022    1:21 PM  Fall Risk   Falls in the past year? 0  Number falls in past yr: 0  Injury with Fall? 0  Risk for fall due to : No Fall Risks  Follow up Falls evaluation completed    Most recent depression screenings:    09/17/2022    1:19 PM 08/21/2022    8:47 AM  PHQ 2/9 Scores  PHQ - 2 Score 0 0   Most recent cognitive screening:     No data to display         Most recent Audit-C alcohol use screening    09/17/2022    1:18 PM  Alcohol Use Disorder Test (AUDIT)  1. How often do you have a drink containing alcohol? 0  2. How many drinks containing alcohol do you have on a typical day when you are drinking? 0  3. How often do you have six or more drinks on one occasion? 0  AUDIT-C Score 0   A score of 3 or more in women, and 4 or more in men indicates increased risk for alcohol abuse, EXCEPT if all of the points are from question 1   Vision/Hearing Screen: No results found.  Last CBC Lab Results  Component Value  Date   WBC 7.3 06/06/2022   HGB 13.4 06/06/2022   HCT 39.4 06/06/2022   MCV 83 06/06/2022   MCH 28.1 06/06/2022   RDW 14.5 06/06/2022   PLT 224 29/56/2130   Last metabolic panel Lab Results  Component Value Date   GLUCOSE 89 06/06/2022   NA 141 06/06/2022   K 4.1 06/06/2022   CL 102 06/06/2022   CO2 24 06/06/2022   BUN 13 06/06/2022   CREATININE 1.04 06/06/2022   EGFR 80 06/06/2022   CALCIUM 9.0 06/06/2022   PROT 6.3 06/06/2022   ALBUMIN 4.4 06/06/2022   LABGLOB 1.9 06/06/2022   AGRATIO 2.3 (H) 06/06/2022   BILITOT 0.3 06/06/2022   ALKPHOS 101 06/06/2022   AST 13 06/06/2022   ALT 18 06/06/2022   ANIONGAP 7 02/11/2022   Last lipids Lab Results  Component Value Date   CHOL 144 06/06/2022   HDL 38 (L) 06/06/2022   LDLCALC 59 06/06/2022   TRIG 298 (H) 06/06/2022   CHOLHDL 3.8 06/06/2022   Last hemoglobin A1c Lab Results  Component Value Date   HGBA1C 6.1 (H) 06/06/2022   Last thyroid functions Lab Results  Component Value Date   TSH 1.530 06/06/2022   Last vitamin D No results found for: "25OHVITD2", "25OHVITD3", "VD25OH" Last vitamin B12 and Folate Lab Results  Component Value Date   VITAMINB12 632 06/06/2022   FOLATE 3.5 06/06/2022      No results found for any visits on 09/17/22.    Assessment & Plan   Annual wellness visit done today including the all of the following: Reviewed patient's Family Medical History Reviewed and updated list of patient's medical providers Assessment of cognitive impairment was done Assessed patient's functional ability Established a written schedule for health screening [place] Completed and Reviewed  Exercise Activities and Dietary recommendations  Goals      Quit Smoking        Immunization History  Administered Date(s) Administered   Influenza Split 09/16/2012   Influenza,inj,Quad PF,6+ Mos 06/06/2022   PNEUMOCOCCAL CONJUGATE-20 02/18/2021   Pneumococcal Polysaccharide-23  07/03/1997   Tdap 03/14/2017    Health Maintenance  Topic Date Due   COLONOSCOPY (Pts 45-63yr Insurance coverage will need to be confirmed)  02/12/2023   Lung Cancer Screening  07/23/2023   Medicare Annual Wellness (AWV)  09/18/2023   DTaP/Tdap/Td (2 - Td or Tdap) 03/15/2027   INFLUENZA VACCINE  Completed   Hepatitis C Screening  Completed   HIV Screening  Completed   HPV VACCINES  Aged Out   COVID-19 Vaccine  Discontinued   Zoster Vaccines- Shingrix  Discontinued     Discussed health benefits of physical activity, and encouraged him to engage in regular exercise appropriate for his age and condition.    Problem List Items Addressed This Visit       Other   Abnormal EKG    Sinus Bradycardia  Incomplete right bundle branch block Referral to Cardiology for further evaluation         Other Visit Diagnoses     Welcome to Medicare preventive visit    -  Primary   Relevant Orders   EKG 12-Lead (Completed)   Encounter for Medicare annual wellness exam       Abnormal finding on EKG       Relevant Orders   Ambulatory referral to Cardiology       Return in 1 year (on 09/18/2023).     IRenard HamperORia Comment FNP

## 2022-09-18 DIAGNOSIS — R9431 Abnormal electrocardiogram [ECG] [EKG]: Secondary | ICD-10-CM | POA: Insufficient documentation

## 2022-09-18 NOTE — Assessment & Plan Note (Signed)
Sinus Bradycardia  Incomplete right bundle branch block Referral to Cardiology for further evaluation

## 2022-09-19 NOTE — Progress Notes (Signed)
Please inform patient referral to cardiology placed for further evaluation due to abnormal EKG reading.

## 2022-09-23 ENCOUNTER — Telehealth: Payer: Self-pay | Admitting: Internal Medicine

## 2022-09-23 NOTE — Telephone Encounter (Signed)
FYI to Dr Doren Custard:  Patient spouse called back to let Dr. Doren Custard know he has an appointment Friday, 01.26.2024 at 10:45 am in [facility]. Patient said thank you .

## 2022-09-23 NOTE — Telephone Encounter (Signed)
   Pre-operative Risk Assessment    Patient Name: Christopher [name]  DOB: 05/11/1956 MRN: [id number]      Request for Surgical Clearance    Procedure:   Two disc in back to be corrected  Date of Surgery:  Clearance TBD                                 Surgeon:  Dr. Chevis Pretty Group or Practice Name:  [facility] Phone number:  [phone] Fax number:  [phone]   Type of Clearance Requested:   - Medical    Type of Anesthesia:  Not Indicated   Additional requests/questions:   Caller noted this is an urgent medical clearance request for the patient.   Signed, Heloise Beecham   09/23/2022, 1:35 PM

## 2022-09-23 NOTE — Telephone Encounter (Signed)
Returned call

## 2022-09-23 NOTE — Telephone Encounter (Signed)
Patient spouse called seen at Dr Annamaria Boots with [facility] this provider found the 2 disc needs corrected ASAP . Had an MRI and needs to have surgery ASAP. Needs a referral to [facility] in [place] so he can get medical clearance from his heart care to have approval for his neck surgery. Patient said found on EKG here in our office with DR Doren Custard sent to Dr Saintclair Halsted needs to make sure he is cleared to do surgery before Februaryy 6, 2024 Dr Annamaria Boots in [place] with [facility]. Please call patient spouse back ASAP [phone].

## 2022-09-23 NOTE — Telephone Encounter (Signed)
   Name: Christopher [name]  DOB: March 11, 1956  MRN: [id number]  Primary Cardiologist: None  Chart reviewed as part of pre-operative protocol coverage. The patient has an upcoming visit scheduled with Dr. Dineen Kid on 10/07/2022 at which time clearance can be addressed in case there are any issues that would impact surgical recommendations.  Back surgery is not scheduled until TBD as below. I added preop FYI to appointment note so that provider is aware to address at time of outpatient visit.  Per office protocol the cardiology provider should forward their finalized clearance decision and recommendations regarding antiplatelet therapy to the requesting party below.    I will route this message as FYI to requesting party and remove this message from the preop box as separate preop APP input not needed at this time.   Please call with any questions.  Lenna Sciara, NP  09/23/2022, 1:48 PM

## 2022-09-26 ENCOUNTER — Encounter: Payer: Self-pay | Admitting: Internal Medicine

## 2022-09-26 ENCOUNTER — Ambulatory Visit: Payer: PPO | Attending: Internal Medicine | Admitting: Internal Medicine

## 2022-09-26 VITALS — BP 122/74 | HR 62 | Ht 66.0 in | Wt 239.8 lb

## 2022-09-26 DIAGNOSIS — Z01818 Encounter for other preprocedural examination: Secondary | ICD-10-CM

## 2022-09-26 DIAGNOSIS — R9431 Abnormal electrocardiogram [ECG] [EKG]: Secondary | ICD-10-CM

## 2022-09-26 DIAGNOSIS — R0602 Shortness of breath: Secondary | ICD-10-CM

## 2022-09-26 DIAGNOSIS — R0609 Other forms of dyspnea: Secondary | ICD-10-CM | POA: Insufficient documentation

## 2022-09-26 MED ORDER — FUROSEMIDE 20 MG PO TABS: 20.0000 mg | ORAL_TABLET | Freq: Every day | ORAL | 0 refills | Status: DC | PRN

## 2022-09-26 NOTE — Progress Notes (Signed)
Cardiology Office Note  Date: 09/26/2022   ID: ALAIN DESCHENE, DOB 1956/06/17, MRN [id number]  PCP:  Johnette Abraham, MD  Cardiologist:  Chalmers Guest, MD Electrophysiologist:  None   Reason for Office Visit: Preop cardiac risk stratification for neck surgery   History of Present Illness: MARCO RAPER is a 67 y.o. male known to have OSA on CPAP, HLD, prediabetes, COPD, former smoker was referred to cardiology clinic for preop cardiac risk stratification for neck surgery and also for abnormal EKG.  Patient was initially referred to cardiology clinic in 2018 for incidental finding of coronary calcifications on CT scan. Due to his symptoms of chest pain and SOB, he underwent right and left heart cath. LHC/RHC showed diffuse mild nonobstructive disease, normal right heart and LV filling pressures and normal cardiac output. He presents today as a new patient visit. He continues to have similar shortness of breath symptoms on exertion that has been ongoing for the last 5 years with no improvement or worsening of symptoms.  He has dizziness when he gets up quickly from sitting to standing position, denies syncope, leg swelling and palpitations.  He reported having chest heaviness associated with SOB on 1 occasion. Former smoker. He was diagnosed with COPD in the past but is currently not on any inhalers.  Past Medical History:  Diagnosis Date   Anxiety    Arthritis    Asthma    BPH (benign prostatic hyperplasia)    Complication of anesthesia    pt had a hard time being able to move after spinal anesthesia , 3-4 hours   Depression    GERD (gastroesophageal reflux disease)    Gout    no meds   Headache(784.0)    otc meds prn   Heart murmur    dx as a child, no problems as an adult   Hyperlipidemia    IBS (irritable bowel syndrome)    PONV (postoperative nausea and vomiting)    Pre-diabetes    Borderline, diet and exercise, no med   Sleep apnea    uses CIPAP machine at  night   Wears partial dentures    bottom partial    Past Surgical History:  Procedure Laterality Date   APPENDECTOMY     BACK SURGERY  2002   neck and back fusion   BIOPSY  12/27/2015   Procedure: BIOPSY;  Surgeon: Rogene Houston, MD;  [facility];  Service: Endoscopy;;  Fundus biopsies and duodenal biopsies   BIOPSY  02/10/2020   Procedure: BIOPSY;  Surgeon: Rogene Houston, MD;  [facility];  Service: Endoscopy;;  antral   CARDIAC CATHETERIZATION     CARDIAC CATHETERIZATION N/A 09/04/2016   Procedure: Right/Left Heart Cath and Coronary Angiography;  Surgeon: Peter M Martinique, MD;  Location: [facility];  Service: Cardiovascular;  Laterality: N/A;   CHOLECYSTECTOMY     CHONDROPLASTY  08/15/2011   Procedure: CHONDROPLASTY;  Surgeon: [name], MD;  [facility]: [facility];  Service: Orthopedics;  Laterality: Left;   COLONOSCOPY  06/27/2011   Procedure: COLONOSCOPY;  Surgeon: Rogene Houston, MD;  [facility];  Service: Endoscopy;  Laterality: N/A;  9:00 / Pt to be here at 9am for 10:45 procedure, benign polyps removed   COLONOSCOPY N/A 10/05/2014   Procedure: COLONOSCOPY;  Surgeon: Rogene Houston, MD;  [facility];  Service: Endoscopy;  Laterality: N/A;  930   COLONOSCOPY N/A 02/11/2018   Procedure:  COLONOSCOPY;  Surgeon: Rogene Houston, MD;  [facility];  Service: Endoscopy;  Laterality: N/A;  830   ESOPHAGOGASTRODUODENOSCOPY N/A 12/27/2015   Procedure: ESOPHAGOGASTRODUODENOSCOPY (EGD);  Surgeon: Rogene Houston, MD;  [facility];  Service: Endoscopy;  Laterality: N/A;  3:00   ESOPHAGOGASTRODUODENOSCOPY (EGD) WITH PROPOFOL N/A 02/10/2020   Procedure: ESOPHAGOGASTRODUODENOSCOPY (EGD) WITH PROPOFOL;  Surgeon: Rogene Houston, MD;  [facility];  Service: Endoscopy;  Laterality: N/A;  155   HERNIA REPAIR     umbilical hernia   JOINT REPLACEMENT     knee and shoulder   KNEE ARTHROSCOPY      left knee   KNEE ARTHROSCOPY     right knee    LUMBAR LAMINECTOMY/DECOMPRESSION MICRODISCECTOMY  09/14/2012   Procedure: LUMBAR LAMINECTOMY/DECOMPRESSION MICRODISCECTOMY 1 LEVEL;  Surgeon: Floyce Stakes, MD;  Location: [facility];  Service: Neurosurgery;  Laterality: Right;  Right Lumbar three-four Diskectomy   neck fusion  2005   PATELLA-FEMORAL ARTHROPLASTY Right 02/18/2022   Procedure: RIGHT KNEE PATELLA REPLACEMENT, CEMENTED;  Surgeon: Meredith Pel, MD;  Location: [place] [facility];  Service: Orthopedics;  Laterality: Right;   POLYPECTOMY  02/11/2018   Procedure: POLYPECTOMY;  Surgeon: Rogene Houston, MD;  [facility];  Service: Endoscopy;;  colon   REVISION TOTAL SHOULDER TO REVERSE TOTAL SHOULDER Left 12/16/2018   Procedure: REVISION TOTAL SHOULDER TO REVERSE TOTAL SHOULDER;  Surgeon: Meredith Pel, MD;  Location: [place];  Service: Orthopedics;  Laterality: Left;   SHOULDER ARTHROSCOPY WITH BICEPSTENOTOMY Left 09/23/2013   Procedure: SHOULDER ARTHROSCOPY WITH BICEPSTENOTOMY AND EXTENSIVE DEBRIDEMENT;  Surgeon: Carole Civil, MD;  [facility];  Service: Orthopedics;  Laterality: Left;   SHOULDER ARTHROSCOPY WITH ROTATOR CUFF REPAIR Left 05/05/2014   Procedure: SHOULDER ARTHROSCOPY LIMITED DEBRIDEMENT;  Surgeon: Carole Civil, MD;  [facility];  Service: Orthopedics;  Laterality: Left;   SHOULDER OPEN ROTATOR CUFF REPAIR Left 05/05/2014   Procedure: ROTATOR CUFF REPAIR SHOULDER OPEN;  Surgeon: Carole Civil, MD;  [facility];  Service: Orthopedics;  Laterality: Left;   SHOULDER OPEN ROTATOR CUFF REPAIR Left 12/16/2018   Procedure: LEFT SHOULDER POSSIBLE SUBSCAPULARIS REPAIR VS. REVISION TO REVERSE TOTAL SHOULDER REPLACEMENT;  Surgeon: Meredith Pel, MD;  Location: [place];  Service: Orthopedics;  Laterality: Left;   SHOULDER SURGERY Right    Open Mumford procedure   SPINAL FUSION     x2, 2003 and 2007   TOTAL KNEE ARTHROPLASTY Left  06/16/2017   Procedure: LEFT TOTAL KNEE ARTHROPLASTY;  Surgeon: Carole Civil, MD;  [facility];  Service: Orthopedics;  Laterality: Left;   TOTAL KNEE ARTHROPLASTY Right 05/14/2021   Procedure: TOTAL KNEE ARTHROPLASTY;  Surgeon: Carole Civil, MD;  [facility];  Service: Orthopedics;  Laterality: Right;   TOTAL SHOULDER ARTHROPLASTY Left 08/19/2018   Procedure: left shoulder replacement;  Surgeon: Meredith Pel, MD;  Location: [place];  Service: Orthopedics;  Laterality: Left;    Current Outpatient Medications  Medication Sig Dispense Refill   acetaminophen (TYLENOL) 500 MG tablet Take 500-1,000 mg by mouth every 6 (six) hours as needed for moderate pain.     cyclobenzaprine (FLEXERIL) 10 MG tablet Take 1 tablet (10 mg total) by mouth 3 (three) times daily as needed for muscle spasms. Do not take with Robaxin 30 tablet 1   EPINEPHrine 0.3 mg/0.3 mL IJ SOAJ injection Inject 0.3 mg into the muscle as needed for anaphylaxis.  furosemide (LASIX) 20 MG tablet Take 1 tablet (20 mg total) by mouth daily as needed (shortness of breath). 30 tablet 0   gabapentin (NEURONTIN) 100 MG capsule Take 1 capsule (100 mg total) by mouth 3 (three) times daily as needed. 60 capsule 2   Menthol, Topical Analgesic, (ICY HOT EX) Apply 1 application topically daily as needed (pain).     nicotine (NICODERM CQ - DOSED IN MG/24 HOURS) 21 mg/24hr patch Place 1 patch (21 mg total) onto the skin daily. 42 patch 0   nitroGLYCERIN (NITROSTAT) 0.4 MG SL tablet PLACE (1) TABLET UNDER TONGUE EVERY 5 MINUTES UP TO (3) DOSES. IF NO RELIEF CALL 911. 25 tablet 0   pantoprazole (PROTONIX) 40 MG tablet TAKE (1) TABLET TWICE A DAY BEFORE MEALS. (Patient taking differently: Take 40 mg by mouth daily.) 60 tablet 0   pregabalin (LYRICA) 50 MG capsule Take 1 capsule (50 mg total) by mouth 2 (two) times daily. (Patient taking differently: Take 50 mg by mouth 2 (two) times daily as needed (pain).) 60 capsule 1    PROAIR HFA 108 (90 Base) MCG/ACT inhaler Inhale 2 puffs into the lungs every 6 (six) hours as needed for wheezing or shortness of breath.      rosuvastatin (CRESTOR) 20 MG tablet Take 1 tablet (20 mg total) by mouth daily. 90 tablet 3   sertraline (ZOLOFT) 50 MG tablet Take 50 mg by mouth in the morning.     methylPREDNISolone (MEDROL DOSEPAK) 4 MG TBPK tablet Take as directed on the dosepack (Patient not taking: Reported on 09/26/2022) 21 tablet 0   No current facility-administered medications for this visit.   Allergies:  Celebrex [celecoxib], Cortisone, Doxycycline, Prednisone, Relafen [nabumetone], Codeine, Aspirin, and Oxycodone-acetaminophen   Social History: The patient  reports that he has been smoking cigarettes. He has a 44.00 pack-year smoking history. He has never used smokeless tobacco. He reports that he does not drink alcohol and does not use drugs.   Family History: The patient's family history includes Alzheimer's disease in his father; Arthritis in an other family member; Diabetes in his father and another family member; Lung disease in an other family member.   ROS:  Please see the history of present illness. Otherwise, complete review of systems is positive for none.  All other systems are reviewed and negative.   Physical Exam: VS:  BP 122/74   Pulse 62   Ht '5\' 6"'$  (1.676 m)   Wt 239 lb 12.8 oz (108.8 kg)   SpO2 93%   BMI 38.70 kg/m , BMI Body mass index is 38.7 kg/m.  Wt Readings from Last 3 Encounters:  09/26/22 239 lb 12.8 oz (108.8 kg)  09/17/22 236 lb (107 kg)  07/11/22 231 lb (104.8 kg)    General: Patient appears comfortable at rest. HEENT: Conjunctiva and lids normal, oropharynx clear with moist mucosa. Neck: Supple, no elevated JVP or carotid bruits, no thyromegaly. Lungs: Clear to auscultation, nonlabored breathing at rest. Cardiac: Regular rate and rhythm, no S3 or significant systolic murmur, no pericardial rub. Abdomen: Soft, nontender, no  hepatomegaly, bowel sounds present, no guarding or rebound. Extremities: No pitting edema, distal pulses 2+. Skin: Warm and dry. Musculoskeletal: No kyphosis. Neuropsychiatric: Alert and oriented x3, affect grossly appropriate.  ECG:  An ECG dated 09/26/2022 was personally reviewed today and demonstrated:  Sinus bradycardia, incomplete right bundle branch block  Recent Labwork: 06/06/2022: ALT 18; AST 13; BUN 13; Creatinine, Ser 1.04; Hemoglobin 13.4; Platelets 224; Potassium 4.1; Sodium 141;  TSH 1.530     Component Value Date/Time   CHOL 144 06/06/2022 1521   TRIG 298 (H) 06/06/2022 1521   HDL 38 (L) 06/06/2022 1521   CHOLHDL 3.8 06/06/2022 1521   LDLCALC 59 06/06/2022 1521    Other Studies Reviewed Today:   Assessment and Plan: Patient is 67 year old M known to have OSA on CPAP, HLD, prediabetes, COPD, former smoker was referred to cardiology clinic for preop cardiac risk stratification for neck surgery and also for abnormal EKG.  # Preop cardiac risk stratification for neck surgery -Patient has chronic stable DOE since 2018 and has not improved or worsened.  He reported having 1 episode of chest heaviness with SOB and denied any bilateral lower extremity swelling. EKG showed sinus bradycardia and an incomplete right bundle branch block. LHC and RHC from 2018 showed diffuse mild nonobstructive CAD, normal LV function, normal right heart and LV filling pressures and normal cardiac output. I do not think this chronic DOE is ischemic in etiology given the fact that he underwent LHC and RHC in 2018 when he had similar symptoms of DOE and found to have mild, nonobstructive CAD with normal filling pressures in the heart. He will need to follow-up with his PCP for COPD management. One episode of chest heaviness might be secondary to SOB but I do not think this is ischemic in etiology. Will also obtain 2D echocardiogram to rule out diastolic dysfunction but patient does not have to wait until  echocardiogram is reported. For time being, he will take Lasix 20 mg daily as needed and assess for any improvement in symptoms. Otherwise, he is compensated on examination and has no loud murmur. He is at a moderate risk for any perioperative cardiac complications. No further cardiac testing like stress test is indicated prior to proceeding with the planned procedure.  # Abnormal EKG -EKG today showed sinus bradycardia and incomplete right bundle branch block (similar to the EKG from PCPs). This is benign and warrants no further intervention.  # DOE -Obtain 2D echocardiogram to rule out diastolic dysfunction. Start Lasix 20 mg daily as needed and assess for improvement in symptoms. Otherwise, follow-up with PCP for COPD management.  # HLD -Continue rosuvastatin 20 mg nightly. -Management of HLD per PCP  # OSA on CPAP -Congratulated and encouraged to be compliant with CPAP  I have spent a total of 45 minutes with patient reviewing chart, EKGs, labs and examining patient as well as establishing an assessment and plan that was discussed with the patient.  > 50% of time was spent in direct patient care.      Medication Adjustments/Labs and Tests Ordered: Current medicines are reviewed at length with the patient today.  Concerns regarding medicines are outlined above.   Tests Ordered: Orders Placed This Encounter  Procedures   EKG 12-Lead   ECHOCARDIOGRAM COMPLETE    Medication Changes: Meds ordered this encounter  Medications   furosemide (LASIX) 20 MG tablet    Sig: Take 1 tablet (20 mg total) by mouth daily as needed (shortness of breath).    Dispense:  30 tablet    Refill:  0    09/26/2022 NEW    Disposition:  Follow up prn  Signed Barbarita Hutmacher Fidel Levy, MD, 09/26/2022 11:52 AM    [facility], [place], [place] [address]

## 2022-09-26 NOTE — Patient Instructions (Addendum)
Medication Instructions:  Your physician has recommended you make the following change in your medication:  Start furosemide 20 mg daily as needed Continue other medications the same  Labwork: none  Testing/Procedures: Your physician has requested that you have an echocardiogram. Echocardiography is a painless test that uses sound waves to create images of your heart. It provides your doctor with information about the size and shape of your heart and how well your heart's chambers and valves are working. This procedure takes approximately one hour. There are no restrictions for this procedure. Please do NOT wear cologne, perfume, aftershave, or lotions (deodorant is allowed). Please arrive 15 minutes prior to your appointment time.  Follow-Up: Your physician recommends that you schedule a follow-up appointment in: as needed  Any Other Special Instructions Will Be Listed Below (If Applicable).  If you need a refill on your cardiac medications before your next appointment, please call your pharmacy.

## 2022-10-01 ENCOUNTER — Telehealth: Payer: Self-pay | Admitting: Internal Medicine

## 2022-10-01 DIAGNOSIS — J449 Chronic obstructive pulmonary disease, unspecified: Secondary | ICD-10-CM

## 2022-10-01 MED ORDER — PROAIR HFA 108 (90 BASE) MCG/ACT IN AERS: 2.0000 | INHALATION_SPRAY | Freq: Four times a day (QID) | RESPIRATORY_TRACT | 2 refills | Status: DC | PRN

## 2022-10-01 NOTE — Telephone Encounter (Signed)
Spouse called said heart care provider check with pcp to send in the Inhaler for albuterol or ProAir.   FYI patient has quit smoking now for 7 weeks.  [facility]  Also asked to fax [facility] attn: Lorriane Shire okay to have surgery.

## 2022-10-02 ENCOUNTER — Other Ambulatory Visit: Payer: PPO

## 2022-10-02 ENCOUNTER — Other Ambulatory Visit: Payer: Self-pay | Admitting: Neurosurgery

## 2022-10-02 ENCOUNTER — Other Ambulatory Visit: Payer: Self-pay

## 2022-10-02 ENCOUNTER — Ambulatory Visit: Payer: PPO

## 2022-10-02 DIAGNOSIS — E291 Testicular hypofunction: Secondary | ICD-10-CM

## 2022-10-03 ENCOUNTER — Ambulatory Visit: Payer: PPO | Admitting: Surgical

## 2022-10-07 ENCOUNTER — Ambulatory Visit: Payer: PPO | Admitting: Internal Medicine

## 2022-10-09 NOTE — Progress Notes (Signed)
Surgical Instructions    Your procedure is scheduled on Monday, 10/13/22.  Report to [facility] Main Entrance "A" at 9:15 A.M., then check in with the Admitting office.  Call this number if you have problems the morning of surgery:  [phone]   If you have any questions prior to your surgery date call [phone]: Open Monday-Friday 8am-4pm If you experience any cold or flu symptoms such as cough, fever, chills, shortness of breath, etc. between now and your scheduled surgery, please notify us at the above number     Remember:  Do not eat after midnight the night before your surgery  You may drink clear liquids until 8:15am the morning of your surgery.   Clear liquids allowed are: Water, Non-Citrus Juices (without pulp), Carbonated Beverages, Clear Tea, Black Coffee ONLY (NO MILK, CREAM OR POWDERED CREAMER of any kind), and Gatorade    Take these medicines the morning of surgery with A SIP OF WATER:  pantoprazole (PROTONIX)  rosuvastatin (CRESTOR)  sertraline (ZOLOFT)   IF NEEDED: acetaminophen (TYLENOL)  methocarbamol (ROBAXIN)  pregabalin (LYRICA)  PROAIR HFA inhaler- bring with you the day of surgery  As of today, STOP taking any Aspirin (unless otherwise instructed by your surgeon) Aleve, Naproxen, Ibuprofen, Motrin, Advil, Goody's, BC's, all herbal medications, fish oil, and all vitamins.           Do not wear jewelry or makeup. Do not wear lotions, powders, cologne or deodorant. Men may shave face and neck. Do not bring valuables to the hospital. Do not wear nail polish, gel polish, artificial nails, or any other type of covering on natural nails (fingers and toes) If you have artificial nails or gel coating that need to be removed by a nail salon, please have this removed prior to surgery. Artificial nails or gel coating may interfere with anesthesia's ability to adequately monitor your vital signs.  [facility] is not responsible for any belongings or valuables.     Do NOT Smoke (Tobacco/Vaping)  24 hours prior to your procedure  If you use a CPAP at night, you may bring your mask for your overnight stay.   Contacts, glasses, hearing aids, dentures or partials may not be worn into surgery, please bring cases for these belongings   For patients admitted to the hospital, discharge time will be determined by your treatment team.   Patients discharged the day of surgery will not be allowed to drive home, and someone needs to stay with them for 24 hours.   SURGICAL WAITING ROOM VISITATION Patients having surgery or a procedure may have no more than 2 support people in the waiting area - these visitors may rotate.   Children under the age of 81 must have an adult with them who is not the patient. If the patient needs to stay at the hospital during part of their recovery, the visitor guidelines for inpatient rooms apply. Pre-op nurse will coordinate an appropriate time for 1 support person to accompany patient in pre-op.  This support person may not rotate.   Please refer to [email or web address].hu for the visitor guidelines for Inpatients (after your surgery is over and you are in a regular room).    Special instructions:    Oral Hygiene is also important to reduce your risk of infection.  Remember - BRUSH YOUR TEETH THE MORNING OF SURGERY WITH YOUR REGULAR TOOTHPASTE   [facility]- Preparing For Surgery  Before surgery, you can play an important role. Because skin is not sterile, your  skin needs to be as free of germs as possible. You can reduce the number of germs on your skin by washing with CHG (chlorahexidine gluconate) Soap before surgery.  CHG is an antiseptic cleaner which kills germs and bonds with the skin to continue killing germs even after washing.     Please do not use if you have an allergy to CHG or antibacterial soaps. If your skin becomes reddened/irritated stop using the CHG.  Do not  shave (including legs and underarms) for at least 48 hours prior to first CHG shower. It is OK to shave your face.  Please follow these instructions carefully.     Shower the NIGHT BEFORE SURGERY and the MORNING OF SURGERY with CHG Soap.   If you chose to wash your hair, wash your hair first as usual with your normal shampoo. After you shampoo, rinse your hair and body thoroughly to remove the shampoo.  Then [employer] and genitals (private parts) with your normal soap and rinse thoroughly to remove soap.  After that Use CHG Soap as you would any other liquid soap. You can apply CHG directly to the skin and wash gently with a scrungie or a clean washcloth.   Apply the CHG Soap to your body ONLY FROM THE NECK DOWN.  Do not use on open wounds or open sores. Avoid contact with your eyes, ears, mouth and genitals (private parts). Wash Face and genitals (private parts)  with your normal soap.   Wash thoroughly, paying special attention to the area where your surgery will be performed.  Thoroughly rinse your body with warm water from the neck down.  DO NOT shower/wash with your normal soap after using and rinsing off the CHG Soap.  Pat yourself dry with a CLEAN TOWEL.  Wear CLEAN PAJAMAS to bed the night before surgery  Place CLEAN SHEETS on your bed the night before your surgery  DO NOT SLEEP WITH PETS.   Day of Surgery: Take a shower with CHG soap. Wear Clean/Comfortable clothing the morning of surgery Do not apply any deodorants/lotions.   Remember to brush your teeth WITH YOUR REGULAR TOOTHPASTE.    If you received a COVID test during your pre-op visit, it is requested that you wear a mask when out in public, stay away from anyone that may not be feeling well, and notify your surgeon if you develop symptoms. If you have been in contact with anyone that has tested positive in the last 10 days, please notify your surgeon.    Please read over the following fact sheets that you were  given.

## 2022-10-10 ENCOUNTER — Encounter (HOSPITAL_COMMUNITY): Payer: Self-pay

## 2022-10-10 ENCOUNTER — Ambulatory Visit (INDEPENDENT_AMBULATORY_CARE_PROVIDER_SITE_OTHER): Payer: PPO | Admitting: Pulmonary Disease

## 2022-10-10 ENCOUNTER — Ambulatory Visit: Payer: PPO | Admitting: Urology

## 2022-10-10 ENCOUNTER — Encounter: Payer: Self-pay | Admitting: Pulmonary Disease

## 2022-10-10 ENCOUNTER — Other Ambulatory Visit: Payer: Self-pay

## 2022-10-10 ENCOUNTER — Encounter (HOSPITAL_COMMUNITY)
Admission: RE | Admit: 2022-10-10 | Discharge: 2022-10-10 | Disposition: A | Discharge status: Home / Self Care | Payer: PPO | Source: Ambulatory Visit | Attending: Neurosurgery | Admitting: Neurosurgery

## 2022-10-10 VITALS — BP 132/82 | HR 78 | Ht 66.0 in | Wt 241.0 lb

## 2022-10-10 VITALS — BP 147/69 | HR 58 | Temp 97.8°F | Resp 18 | Ht 66.0 in | Wt 237.0 lb

## 2022-10-10 DIAGNOSIS — G4733 Obstructive sleep apnea (adult) (pediatric): Secondary | ICD-10-CM | POA: Insufficient documentation

## 2022-10-10 DIAGNOSIS — J439 Emphysema, unspecified: Secondary | ICD-10-CM | POA: Insufficient documentation

## 2022-10-10 DIAGNOSIS — R001 Bradycardia, unspecified: Secondary | ICD-10-CM | POA: Insufficient documentation

## 2022-10-10 DIAGNOSIS — Z01812 Encounter for preprocedural laboratory examination: Secondary | ICD-10-CM | POA: Insufficient documentation

## 2022-10-10 DIAGNOSIS — I209 Angina pectoris, unspecified: Secondary | ICD-10-CM

## 2022-10-10 DIAGNOSIS — R0609 Other forms of dyspnea: Secondary | ICD-10-CM | POA: Diagnosis not present

## 2022-10-10 DIAGNOSIS — I4510 Unspecified right bundle-branch block: Secondary | ICD-10-CM | POA: Insufficient documentation

## 2022-10-10 DIAGNOSIS — E871 Hypo-osmolality and hyponatremia: Secondary | ICD-10-CM | POA: Insufficient documentation

## 2022-10-10 DIAGNOSIS — Z01818 Encounter for other preprocedural examination: Secondary | ICD-10-CM

## 2022-10-10 DIAGNOSIS — Z87891 Personal history of nicotine dependence: Secondary | ICD-10-CM | POA: Insufficient documentation

## 2022-10-10 DIAGNOSIS — R0602 Shortness of breath: Secondary | ICD-10-CM | POA: Insufficient documentation

## 2022-10-10 DIAGNOSIS — J432 Centrilobular emphysema: Secondary | ICD-10-CM | POA: Diagnosis not present

## 2022-10-10 HISTORY — DX: Personal history of other diseases of the digestive system: Z87.19

## 2022-10-10 LAB — BASIC METABOLIC PANEL
Anion gap: 10 (ref 5–15)
BUN: 14 mg/dL (ref 8–23)
CO2: 23 mmol/L (ref 22–32)
Calcium: 9 mg/dL (ref 8.9–10.3)
Chloride: 100 mmol/L (ref 98–111)
Creatinine, Ser: 0.87 mg/dL (ref 0.61–1.24)
GFR, Estimated: >60 mL/min (ref >60)
Glucose, Bld: 211 mg/dL — ABNORMAL HIGH (ref 70–99)
Potassium: 4 mmol/L (ref 3.5–5.1)
Sodium: 133 mmol/L — ABNORMAL LOW (ref 135–145)

## 2022-10-10 LAB — CBC
HCT: 38.4 % — ABNORMAL LOW (ref 39.0–52.0)
Hemoglobin: 13.5 g/dL (ref 13.0–17.0)
MCH: 29 pg (ref 26.0–34.0)
MCHC: 35.2 g/dL (ref 30.0–36.0)
MCV: 82.4 fL (ref 80.0–100.0)
Platelets: 231 10*3/uL (ref 150–400)
RBC: 4.66 MIL/uL (ref 4.22–5.81)
RDW: 14.5 % (ref 11.5–15.5)
WBC: 8.3 10*3/uL (ref 4.0–10.5)
nRBC: 0 % (ref 0.0–0.2)

## 2022-10-10 LAB — SURGICAL PCR SCREEN
MRSA, PCR: NEGATIVE
Staphylococcus aureus: NEGATIVE

## 2022-10-10 MED ORDER — STIOLTO RESPIMAT 2.5-2.5 MCG/ACT IN AERS: 2.0000 | INHALATION_SPRAY | Freq: Every day | RESPIRATORY_TRACT | 5 refills | Status: DC

## 2022-10-10 NOTE — Anesthesia Preprocedure Evaluation (Signed)
Anesthesia Evaluation  Patient identified by MRN, date of birth, ID band Patient awake    Reviewed: Allergy & Precautions, H&P , NPO status , Patient's Chart, lab work & pertinent test results  History of Anesthesia Complications (+) PONV and history of anesthetic complications  Airway Mallampati: III  TM Distance: <3 FB Neck ROM: Full    Dental no notable dental hx.    Pulmonary sleep apnea and Continuous Positive Airway Pressure Ventilation , COPD, Patient abstained from smoking., former smoker   Pulmonary exam normal breath sounds clear to auscultation       Cardiovascular negative cardio ROS Normal cardiovascular exam Rhythm:Regular Rate:Normal     Neuro/Psych negative neurological ROS  negative psych ROS   GI/Hepatic Neg liver ROS,GERD  ,,  Endo/Other    Morbid obesity  Renal/GU negative Renal ROS  negative genitourinary   Musculoskeletal negative musculoskeletal ROS (+)    Abdominal   Peds negative pediatric ROS (+)  Hematology negative hematology ROS (+)   Anesthesia Other Findings   Reproductive/Obstetrics negative OB ROS                             Anesthesia Physical Anesthesia Plan  ASA: 3  Anesthesia Plan: General   Post-op Pain Management: Tylenol PO (pre-op)*   Induction: Intravenous  PONV Risk Score and Plan: 3 and Ondansetron, Dexamethasone and Treatment may vary due to age or medical condition  Airway Management Planned: Oral ETT  Additional Equipment:   Intra-op Plan:   Post-operative Plan: Extubation in OR  Informed Consent: I have reviewed the patients History and Physical, chart, labs and discussed the procedure including the risks, benefits and alternatives for the proposed anesthesia with the patient or authorized representative who has indicated his/her understanding and acceptance.     Dental advisory given  Plan Discussed with: CRNA and  Surgeon  Anesthesia Plan Comments: (PAT note by Karoline Caldwell, PA-C: Pt seen by cardiology 2018 for eval of intermittent CP and coronary calcifications seen on CT. Had cath 09/04/2016 showing diffuse mild nonobstructive disease, normal LV function EF 55-65%, normal right heart and LV filling pressures, normal cardiac output. Medical therapy recommended.  He was last seen by Dr. Dellia Cloud 09/26/2022 for preop evaluation.  Per note, "Patient has chronic stable DOE since 2018 and has not improved or worsened. He reported having 1 episode of chest heaviness with SOB and denied any bilateral lower extremity swelling. EKG showed sinus bradycardia and an incomplete right bundle branch block.LHC and RHC from 2018 showed diffuse mild nonobstructive CAD, normal LV function, normal right heart and LV filling pressures and normal cardiac output. I do not think this chronic DOE is ischemic in etiology given the fact that he underwent LHC and RHC in 2018 when he had similar symptoms of DOE and found to have mild, nonobstructive CAD with normal filling pressures in the heart. He will need to follow-up with his PCP for COPD management. Oneepisode of chest heaviness might be secondary to SOB but I do not think this is ischemic in etiology. Will also obtain 2D echocardiogram to rule out diastolic dysfunction but patient does not have to wait until echocardiogram is reported. For time being, he will take Lasix 20 mg daily as needed and assess for any improvement in symptoms. Otherwise, he is compensated on examination and has no loud murmur.He is at a moderate risk for any perioperative cardiac complications. No further cardiac testing like stress test is  indicated prior to proceeding with the planned procedure."   Follows with pulmonologist Dr. Halford Chessman for history of OSA and DOE likely secondary to emphysema with possible COPD, obesity, and deconditioning.  He is a former smoker with 44-pack-year history.  Last seen 10/10/2022,  stable at that time, advised no pulmonary contraindications for him to proceed with surgery.  Preop labs reviewed, mild hyponatremia sodium 133, otherwise unremarkable.   EKG 09/26/2022: Sinus bradycardia.  Rate 57.  Incomplete right bundle branch block.   Cath 09/04/16:            The left ventricular systolic function is normal.            LV end diastolic pressure is normal.            The left ventricular ejection fraction is 55-65% by visual estimate.            LV end diastolic pressure is normal.   1. Diffuse mild nonobstructive disease 2. Normal LV function 3. Normal right heart and LV filling pressures 4. Normal cardiac output.    Plan: Medical therapy.  )        Anesthesia Quick Evaluation

## 2022-10-10 NOTE — Progress Notes (Signed)
Anesthesia Chart Review:  Pt seen by cardiology 2018 for eval of intermittent CP and coronary calcifications seen on CT. Had cath 09/04/2016 showing diffuse mild nonobstructive disease, normal LV function EF 55-65%, normal right heart and LV filling pressures, normal cardiac output. Medical therapy recommended.  He was last seen by Dr. Dellia Cloud 09/26/2022 for preop evaluation.  Per note, "Patient has chronic stable DOE since 2018 and has not improved or worsened.  He reported having 1 episode of chest heaviness with SOB and denied any bilateral lower extremity swelling. EKG showed sinus bradycardia and an incomplete right bundle branch block. LHC and RHC from 2018 showed diffuse mild nonobstructive CAD, normal LV function, normal right heart and LV filling pressures and normal cardiac output. I do not think this chronic DOE is ischemic in etiology given the fact that he underwent LHC and RHC in 2018 when he had similar symptoms of DOE and found to have mild, nonobstructive CAD with normal filling pressures in the heart. He will need to follow-up with his PCP for COPD management. One episode of chest heaviness might be secondary to SOB but I do not think this is ischemic in etiology. Will also obtain 2D echocardiogram to rule out diastolic dysfunction but patient does not have to wait until echocardiogram is reported. For time being, he will take Lasix 20 mg daily as needed and assess for any improvement in symptoms. Otherwise, he is compensated on examination and has no loud murmur. He is at a moderate risk for any perioperative cardiac complications. No further cardiac testing like stress test is indicated prior to proceeding with the planned procedure."   Follows with pulmonologist Dr. Halford Chessman for history of OSA and DOE likely secondary to emphysema with possible COPD, obesity, and deconditioning.  He is a former smoker with 44-pack-year history.  Last seen 10/10/2022, stable at that time, advised no pulmonary  contraindications for him to proceed with surgery.  Preop labs reviewed, mild hyponatremia sodium 133, otherwise unremarkable.   EKG 09/26/2022: Sinus bradycardia.  Rate 57.  Incomplete right bundle branch block.   Cath 09/04/16:            The left ventricular systolic function is normal.            LV end diastolic pressure is normal.            The left ventricular ejection fraction is 55-65% by visual estimate.            LV end diastolic pressure is normal.   1. Diffuse mild nonobstructive disease 2. Normal LV function 3. Normal right heart and LV filling pressures 4. Normal cardiac output.    Plan: Medical therapy.     Wynonia Musty [facility]/Anesthesiology Phone [phone] 10/10/2022 3:10 PM

## 2022-10-10 NOTE — Patient Instructions (Addendum)
Stiolto two puffs daily  Albuterol two puffs every 6 hours as needed for cough, wheeze, chest congestion, or shortness of breath  Will arrange for pulmonary function test at [facility]  Make sure you get a follow up appointment with Dr. Rexene Alberts to manage your obstructive sleep apnea  Follow up in 8 to 10 weeks

## 2022-10-10 NOTE — Progress Notes (Signed)
[facility]  Chief Complaint  Patient presents with   Consult    SOB     Past Surgical History:  He  has a past surgical history that includes Knee arthroscopy; Knee arthroscopy; Spinal fusion; neck fusion (2005); Colonoscopy (06/27/2011); Hernia repair; Cardiac catheterization; Chondroplasty (08/15/2011); Lumbar laminectomy/decompression microdiscectomy (09/14/2012); Shoulder surgery (Right); Appendectomy; Back surgery (2002); Shoulder arthroscopy with rotator cuff repair (Left, 05/05/2014); Shoulder open rotator cuff repair (Left, 05/05/2014); Shoulder arthroscopy with bicepstenotomy (Left, 09/23/2013); Colonoscopy (N/A, 10/05/2014); Esophagogastroduodenoscopy (N/A, 12/27/2015); biopsy (12/27/2015); Cardiac catheterization (N/A, 09/04/2016); Total knee arthroplasty (Left, 06/16/2017); Colonoscopy (N/A, 02/11/2018); polypectomy (02/11/2018); Cholecystectomy; Total shoulder arthroplasty (Left, 08/19/2018); Shoulder open rotator cuff repair (Left, 12/16/2018); Revision total shoulder to reverse total shoulder (Left, 12/16/2018); Joint replacement; Esophagogastroduodenoscopy (egd) with propofol (N/A, 02/10/2020); biopsy (02/10/2020); Total knee arthroplasty (Right, 05/14/2021); and Patella-femoral arthroplasty (Right, 02/18/2022).  Past Medical History:  Anxiety, OA, BPH, Depression, GERD, Gout, HLD, IBS, Pre DM, Low T  Constitutional:  BP 132/82   Pulse 78   Ht 5' 6"$  (1.676 m)   Wt 241 lb (109.3 kg)   SpO2 97% Comment: ra  BMI 38.90 kg/m   Brief Summary:  Christopher [name] is a 67 y.o. male former smoker with dyspnea.      Subjective:   He is here with his wife.  His breathing has been a problem for the past 1 year.  He doesn't recall any specific event that triggered this.  He can walk 1/2 block and then gets winded.  He doesn't feel like he gets enough air into his lung.  He had the flu last Fall and stopped smoking after that.  He had lung  cancer screening CT chest and this showed changes of emphysema.  He has been using albuterol and this helps, but doesn't last.  He doesn't usually have a cough or chest congestion.  Doesn't get wheezing.  He uses CPAP nightly without difficulty.  His energy level seems low during the day.  He is being assessed for low testosterone levels.    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  He was never in the [employer].  No history of pneumonia or tuberculosis.  Some of his family members have asthma and COPD.  He has neck pain and is scheduled for neck surgery with Dr. Saintclair Halsted later this month at [facility].  Physical Exam:   Appearance - well kempt   ENMT - no sinus tenderness, no oral exudate, no LAN, Mallampati 4 airway, no stridor  Respiratory - equal breath sounds bilaterally, no wheezing or rales  CV - s1s2 regular rate and rhythm, no murmurs  Ext - no clubbing, no edema  Skin - no rashes  Psych - normal mood and affect   Pulmonary testing:    Chest Imaging:  LDCT chest 07/24/22 >> coronary calcification, centrilobular and paraseptal emphysema, tiny b/l nodules stable, fatty liver  Sleep Tests:  PSG 07/27/21 >> AHI 48.7, SpO2 low 89%  Cardiac Tests:    Social History:  He  reports that he has quit smoking. His smoking use included cigarettes. He has a 44.00 pack-year smoking history. He has never used smokeless tobacco. He reports that he does not drink alcohol and does not use drugs.  Family History:  His family history includes Alzheimer's disease in his father; Arthritis in an other family member; Diabetes in his father and another family member; Lung disease in an other family  member.     Assessment/Plan:   Dyspnea on exertion. - likely from emphysema with possible COPD, obesity with deconditioning and possible diastolic CHF - will have him try stiolto two puffs daily and arrange for pulmonary function testing - continue albuterol prn -  he has echocardiogram ordered  - he would benefit from an exercise and weight reduction program  Obstructive sleep apnea. - he will follow up with Dr. Rexene Alberts with [facility]  Neck pain. - he is scheduled for cervical neck surgery with Dr. Kary Kos later this month - no pulmonary contraindications for him to proceed with surgery  Time Spent Involved in Patient Care on Day of Examination:  51 minutes  Follow up:   Patient Instructions  Stiolto two puffs daily  Albuterol two puffs every 6 hours as needed for cough, wheeze, chest congestion, or shortness of breath  Will arrange for pulmonary function test at [facility]  Make sure you get a follow up appointment with Dr. Rexene Alberts to manage your obstructive sleep apnea  Follow up in 8 to 10 weeks  Medication List:   Allergies as of 10/10/2022       Reactions   Celebrex [celecoxib] Itching, Swelling, Other (See Comments)   All over   Cortisone Swelling   SWELLING REACTION UNSPECIFIED    Doxycycline Swelling, Other (See Comments)   Made tongue turn black    Prednisone Swelling   SWELLING REACTION UNSPECIFIED    Relafen [nabumetone] Swelling   SWELLING REACTION UNSPECIFIED    Codeine Nausea And Vomiting, Other (See Comments)   Extreme stomach pain. This includes anything with the derivative of codeine in it. (Does tolerate hydrocodone)    Aspirin Itching   Stomach cramps   Oxycodone-acetaminophen Itching, Other (See Comments)   Can tolerate with benadryl         Medication List        Accurate as of October 10, 2022  1:36 PM. If you have any questions, ask your nurse or doctor.          acetaminophen 500 MG tablet Commonly known as: TYLENOL Take 1,000 mg by mouth every 6 (six) hours as needed for moderate pain.   cyclobenzaprine 10 MG tablet Commonly known as: FLEXERIL Take 1 tablet (10 mg total) by mouth 3 (three) times daily as needed for muscle spasms. Do not take with Robaxin   EPINEPHrine 0.3 mg/0.3  mL Soaj injection Commonly known as: EPI-PEN Inject 0.3 mg into the muscle as needed for anaphylaxis.   furosemide 20 MG tablet Commonly known as: LASIX Take 1 tablet (20 mg total) by mouth daily as needed (shortness of breath).   gabapentin 100 MG capsule Commonly known as: NEURONTIN Take 1 capsule (100 mg total) by mouth 3 (three) times daily as needed.   ICY HOT EX Apply 1 application topically daily as needed (pain).   methocarbamol 500 MG tablet Commonly known as: ROBAXIN Take 500 mg by mouth every 8 (eight) hours as needed for muscle spasms.   methylPREDNISolone 4 MG Tbpk tablet Commonly known as: MEDROL DOSEPAK Take as directed on the dosepack   nitroGLYCERIN 0.4 MG SL tablet Commonly known as: NITROSTAT PLACE (1) TABLET UNDER TONGUE EVERY 5 MINUTES UP TO (3) DOSES. IF NO RELIEF CALL 911.   pantoprazole 40 MG tablet Commonly known as: PROTONIX TAKE (1) TABLET TWICE A DAY BEFORE MEALS. What changed: See the new instructions.   pregabalin 50 MG capsule Commonly known as: LYRICA Take 1 capsule (50 mg  total) by mouth 2 (two) times daily. What changed:  when to take this reasons to take this   ProAir HFA 108 (90 Base) MCG/ACT inhaler Generic drug: albuterol Inhale 2 puffs into the lungs every 6 (six) hours as needed for wheezing or shortness of breath.   rosuvastatin 20 MG tablet Commonly known as: Crestor Take 1 tablet (20 mg total) by mouth daily.   sertraline 50 MG tablet Commonly known as: ZOLOFT Take 50 mg by mouth in the morning.   Stiolto Respimat 2.5-2.5 MCG/ACT Aers Generic drug: Tiotropium Bromide-Olodaterol Inhale 2 puffs into the lungs daily. Started by: Chesley Mires, MD        Signature:  Chesley Mires, MD [place] Pager - [phone] 10/10/2022, 1:36 PM

## 2022-10-10 NOTE — Progress Notes (Signed)
PCP - Olin Hauser Cardiologist - Mallipeddi Pulmonologist: Sood  PPM/ICD - denies  Chest x-ray - n/a EKG - 09/26/22 Stress Test - 2003 ECHO - denies Cardiac Cath - 09/04/16  Sleep Study - +OSA CPAP - wears nightly, settings "40"  Pre-diabetic   ERAS Protcol -yes   COVID TEST- not needed   Anesthesia review: yes, cardiac clearance note dated 1/26. Notified james and allison to review chart  Patient denies shortness of breath, fever, cough and chest pain at PAT appointment   All instructions explained to the patient, with a verbal understanding of the material. Patient agrees to go over the instructions while at home for a better understanding. Patient also instructed to self quarantine after being tested for COVID-19. The opportunity to ask questions was provided.

## 2022-10-12 LAB — CBC WITH DIFFERENTIAL
Basophils Absolute: 0 10*3/uL (ref 0.0–0.2)
Basos: 1 %
EOS (ABSOLUTE): 0 10*3/uL (ref 0.0–0.4)
Eos: 0 %
Hematocrit: 36.9 % — ABNORMAL LOW (ref 37.5–51.0)
Hemoglobin: 12.2 g/dL — ABNORMAL LOW (ref 13.0–17.7)
Immature Grans (Abs): 0 10*3/uL (ref 0.0–0.1)
Immature Granulocytes: 1 %
Lymphocytes Absolute: 2.1 10*3/uL (ref 0.7–3.1)
Lymphs: 31 %
MCH: 27.2 pg (ref 26.6–33.0)
MCHC: 33.1 g/dL (ref 31.5–35.7)
MCV: 82 fL (ref 79–97)
Monocytes Absolute: 0.5 10*3/uL (ref 0.1–0.9)
Monocytes: 8 %
Neutrophils Absolute: 4 10*3/uL (ref 1.4–7.0)
Neutrophils: 59 %
RBC: 4.48 x10E6/uL (ref 4.14–5.80)
RDW: 14.1 % (ref 11.6–15.4)
WBC: 6.6 10*3/uL (ref 3.4–10.8)

## 2022-10-12 LAB — COMPREHENSIVE METABOLIC PANEL
ALT: 29 IU/L (ref 0–44)
AST: 17 IU/L (ref 0–40)
Albumin/Globulin Ratio: 2.3 — ABNORMAL HIGH (ref 1.2–2.2)
Albumin: 4.2 g/dL (ref 3.9–4.9)
Alkaline Phosphatase: 86 IU/L (ref 44–121)
BUN/Creatinine Ratio: 17 (ref 10–24)
BUN: 16 mg/dL (ref 8–27)
Bilirubin Total: 0.3 mg/dL (ref 0.0–1.2)
CO2: 21 mmol/L (ref 20–29)
Calcium: 9.2 mg/dL (ref 8.6–10.2)
Chloride: 101 mmol/L (ref 96–106)
Creatinine, Ser: 0.94 mg/dL (ref 0.76–1.27)
Globulin, Total: 1.8 g/dL (ref 1.5–4.5)
Glucose: 189 mg/dL — ABNORMAL HIGH (ref 70–99)
Potassium: 4.4 mmol/L (ref 3.5–5.2)
Sodium: 139 mmol/L (ref 134–144)
Total Protein: 6 g/dL (ref 6.0–8.5)
eGFR: 91 mL/min/{1.73_m2} (ref >59)

## 2022-10-12 LAB — TESTOSTERONE,FREE AND TOTAL
Testosterone, Free: 0.9 pg/mL — ABNORMAL LOW (ref 6.6–18.1)
Testosterone: 52 ng/dL — ABNORMAL LOW (ref 264–916)

## 2022-10-12 LAB — ESTRADIOL: Estradiol: 14.3 pg/mL (ref 7.6–42.6)

## 2022-10-13 ENCOUNTER — Inpatient Hospital Stay (HOSPITAL_COMMUNITY): Payer: PPO | Admitting: Physician Assistant

## 2022-10-13 ENCOUNTER — Other Ambulatory Visit: Payer: Self-pay

## 2022-10-13 ENCOUNTER — Inpatient Hospital Stay (HOSPITAL_COMMUNITY): Payer: PPO

## 2022-10-13 ENCOUNTER — Encounter (HOSPITAL_COMMUNITY)
Admission: RE | Disposition: A | Discharge status: Home / Self Care | Payer: Self-pay | Source: Ambulatory Visit | Attending: Neurosurgery

## 2022-10-13 ENCOUNTER — Inpatient Hospital Stay (HOSPITAL_COMMUNITY)
Admission: RE | Admit: 2022-10-13 | Discharge: 2022-10-14 | DRG: 472 | Disposition: A | Discharge status: Home / Self Care | Payer: PPO | Source: Ambulatory Visit | Attending: Neurosurgery | Admitting: Neurosurgery

## 2022-10-13 ENCOUNTER — Inpatient Hospital Stay (HOSPITAL_COMMUNITY): Payer: PPO | Admitting: Certified Registered"

## 2022-10-13 DIAGNOSIS — I2510 Atherosclerotic heart disease of native coronary artery without angina pectoris: Secondary | ICD-10-CM | POA: Diagnosis present

## 2022-10-13 DIAGNOSIS — Z6838 Body mass index (BMI) 38.0-38.9, adult: Secondary | ICD-10-CM

## 2022-10-13 DIAGNOSIS — Z9989 Dependence on other enabling machines and devices: Secondary | ICD-10-CM | POA: Diagnosis not present

## 2022-10-13 DIAGNOSIS — R7303 Prediabetes: Secondary | ICD-10-CM | POA: Diagnosis present

## 2022-10-13 DIAGNOSIS — R001 Bradycardia, unspecified: Secondary | ICD-10-CM | POA: Diagnosis present

## 2022-10-13 DIAGNOSIS — Z981 Arthrodesis status: Secondary | ICD-10-CM

## 2022-10-13 DIAGNOSIS — Z833 Family history of diabetes mellitus: Secondary | ICD-10-CM

## 2022-10-13 DIAGNOSIS — I4510 Unspecified right bundle-branch block: Secondary | ICD-10-CM | POA: Diagnosis present

## 2022-10-13 DIAGNOSIS — Z7951 Long term (current) use of inhaled steroids: Secondary | ICD-10-CM

## 2022-10-13 DIAGNOSIS — M25511 Pain in right shoulder: Secondary | ICD-10-CM | POA: Diagnosis present

## 2022-10-13 DIAGNOSIS — M4722 Other spondylosis with radiculopathy, cervical region: Secondary | ICD-10-CM | POA: Diagnosis present

## 2022-10-13 DIAGNOSIS — Z87891 Personal history of nicotine dependence: Secondary | ICD-10-CM

## 2022-10-13 DIAGNOSIS — M50121 Cervical disc disorder at C4-C5 level with radiculopathy: Secondary | ICD-10-CM | POA: Diagnosis present

## 2022-10-13 DIAGNOSIS — Z886 Allergy status to analgesic agent status: Secondary | ICD-10-CM | POA: Diagnosis not present

## 2022-10-13 DIAGNOSIS — N400 Enlarged prostate without lower urinary tract symptoms: Secondary | ICD-10-CM | POA: Diagnosis present

## 2022-10-13 DIAGNOSIS — J449 Chronic obstructive pulmonary disease, unspecified: Secondary | ICD-10-CM

## 2022-10-13 DIAGNOSIS — M4802 Spinal stenosis, cervical region: Principal | ICD-10-CM | POA: Diagnosis present

## 2022-10-13 DIAGNOSIS — M79601 Pain in right arm: Secondary | ICD-10-CM | POA: Diagnosis present

## 2022-10-13 DIAGNOSIS — E785 Hyperlipidemia, unspecified: Secondary | ICD-10-CM | POA: Diagnosis present

## 2022-10-13 DIAGNOSIS — Z885 Allergy status to narcotic agent status: Secondary | ICD-10-CM

## 2022-10-13 DIAGNOSIS — G4733 Obstructive sleep apnea (adult) (pediatric): Secondary | ICD-10-CM | POA: Diagnosis present

## 2022-10-13 DIAGNOSIS — K219 Gastro-esophageal reflux disease without esophagitis: Secondary | ICD-10-CM | POA: Diagnosis present

## 2022-10-13 DIAGNOSIS — E871 Hypo-osmolality and hyponatremia: Secondary | ICD-10-CM | POA: Diagnosis present

## 2022-10-13 DIAGNOSIS — Z79899 Other long term (current) drug therapy: Secondary | ICD-10-CM

## 2022-10-13 DIAGNOSIS — J439 Emphysema, unspecified: Secondary | ICD-10-CM | POA: Diagnosis present

## 2022-10-13 DIAGNOSIS — Z9049 Acquired absence of other specified parts of digestive tract: Secondary | ICD-10-CM

## 2022-10-13 DIAGNOSIS — Z820 Family history of epilepsy and other diseases of the nervous system: Secondary | ICD-10-CM | POA: Diagnosis not present

## 2022-10-13 DIAGNOSIS — Z881 Allergy status to other antibiotic agents status: Secondary | ICD-10-CM | POA: Diagnosis not present

## 2022-10-13 DIAGNOSIS — Z96612 Presence of left artificial shoulder joint: Secondary | ICD-10-CM | POA: Diagnosis present

## 2022-10-13 DIAGNOSIS — Z888 Allergy status to other drugs, medicaments and biological substances status: Secondary | ICD-10-CM

## 2022-10-13 DIAGNOSIS — Z96653 Presence of artificial knee joint, bilateral: Secondary | ICD-10-CM | POA: Diagnosis present

## 2022-10-13 HISTORY — PX: ANTERIOR CERVICAL DECOMP/DISCECTOMY FUSION: SHX1161

## 2022-10-13 LAB — TYPE AND SCREEN
ABO/RH(D): B POS
Antibody Screen: NEGATIVE

## 2022-10-13 SURGERY — ANTERIOR CERVICAL DECOMPRESSION/DISCECTOMY FUSION 2 LEVEL/HARDWARE REMOVAL
Anesthesia: General

## 2022-10-13 MED ORDER — SCOPOLAMINE 1 MG/3DAYS TD PT72
MEDICATED_PATCH | TRANSDERMAL | Status: DC | PRN
  Administered 2022-10-13: 1 via TRANSDERMAL

## 2022-10-13 MED ORDER — CEFAZOLIN SODIUM 1 G IJ SOLR
INTRAMUSCULAR | Status: AC
  Filled 2022-10-13: qty 20

## 2022-10-13 MED ORDER — LIDOCAINE 2% (20 MG/ML) 5 ML SYRINGE
INTRAMUSCULAR | Status: DC | PRN
  Administered 2022-10-13: 40 mg via INTRAVENOUS

## 2022-10-13 MED ORDER — CYCLOBENZAPRINE HCL 10 MG PO TABS: 10.0000 mg | ORAL_TABLET | Freq: Three times a day (TID) | ORAL | Status: DC | PRN

## 2022-10-13 MED ORDER — DIPHENHYDRAMINE HCL 50 MG/ML IJ SOLN
50.0000 mg | Freq: Once | INTRAMUSCULAR | Status: AC
  Administered 2022-10-13: 50 mg via INTRAMUSCULAR
  Filled 2022-10-13: qty 1

## 2022-10-13 MED ORDER — DROPERIDOL 2.5 MG/ML IJ SOLN
INTRAMUSCULAR | Status: AC
  Filled 2022-10-13: qty 2

## 2022-10-13 MED ORDER — PROPOFOL 10 MG/ML IV BOLUS
INTRAVENOUS | Status: DC | PRN
  Administered 2022-10-13: 120 mg via INTRAVENOUS
  Administered 2022-10-13: 80 mg via INTRAVENOUS

## 2022-10-13 MED ORDER — FUROSEMIDE 20 MG PO TABS: 20.0000 mg | ORAL_TABLET | Freq: Every day | ORAL | Status: DC | PRN

## 2022-10-13 MED ORDER — HYDROMORPHONE HCL 1 MG/ML IJ SOLN
INTRAMUSCULAR | Status: AC
  Filled 2022-10-13: qty 1

## 2022-10-13 MED ORDER — CYCLOBENZAPRINE HCL 10 MG PO TABS
10.0000 mg | ORAL_TABLET | Freq: Three times a day (TID) | ORAL | Status: DC | PRN
  Administered 2022-10-13: 10 mg via ORAL
  Filled 2022-10-13: qty 1

## 2022-10-13 MED ORDER — DEXMEDETOMIDINE HCL IN NACL 80 MCG/20ML IV SOLN
INTRAVENOUS | Status: AC
  Filled 2022-10-13: qty 20

## 2022-10-13 MED ORDER — PHENYLEPHRINE HCL-NACL 20-0.9 MG/250ML-% IV SOLN
INTRAVENOUS | Status: DC | PRN
  Administered 2022-10-13: 35 ug/min via INTRAVENOUS

## 2022-10-13 MED ORDER — ALUM & MAG HYDROXIDE-SIMETH 200-200-20 MG/5ML PO SUSP: 30.0000 mL | Freq: Four times a day (QID) | ORAL | Status: DC | PRN

## 2022-10-13 MED ORDER — ROCURONIUM BROMIDE 10 MG/ML (PF) SYRINGE
PREFILLED_SYRINGE | INTRAVENOUS | Status: AC
  Filled 2022-10-13: qty 10

## 2022-10-13 MED ORDER — METHOCARBAMOL 500 MG PO TABS
500.0000 mg | ORAL_TABLET | Freq: Three times a day (TID) | ORAL | Status: DC | PRN
  Administered 2022-10-13 – 2022-10-14 (×2): 500 mg via ORAL
  Filled 2022-10-13 (×2): qty 1

## 2022-10-13 MED ORDER — CHLORHEXIDINE GLUCONATE 0.12 % MT SOLN
15.0000 mL | Freq: Once | OROMUCOSAL | Status: AC
  Administered 2022-10-13: 15 mL via OROMUCOSAL
  Filled 2022-10-13: qty 15

## 2022-10-13 MED ORDER — GLYCOPYRROLATE PF 0.2 MG/ML IJ SOSY
PREFILLED_SYRINGE | INTRAMUSCULAR | Status: DC | PRN
  Administered 2022-10-13: .2 mg via INTRAVENOUS

## 2022-10-13 MED ORDER — ONDANSETRON HCL 4 MG/2ML IJ SOLN
INTRAMUSCULAR | Status: AC
  Filled 2022-10-13: qty 2

## 2022-10-13 MED ORDER — SODIUM CHLORIDE 0.9% FLUSH
3.0000 mL | Freq: Two times a day (BID) | INTRAVENOUS | Status: DC
  Administered 2022-10-13 (×2): 3 mL via INTRAVENOUS

## 2022-10-13 MED ORDER — PREGABALIN 50 MG PO CAPS: 50.0000 mg | ORAL_CAPSULE | Freq: Two times a day (BID) | ORAL | Status: DC | PRN

## 2022-10-13 MED ORDER — PANTOPRAZOLE SODIUM 40 MG PO TBEC: 40.0000 mg | DELAYED_RELEASE_TABLET | Freq: Every day | ORAL | Status: DC

## 2022-10-13 MED ORDER — ONDANSETRON HCL 4 MG/2ML IJ SOLN
INTRAMUSCULAR | Status: DC | PRN
  Administered 2022-10-13: 4 mg via INTRAVENOUS

## 2022-10-13 MED ORDER — ACETAMINOPHEN 650 MG RE SUPP: 650.0000 mg | RECTAL | Status: DC | PRN

## 2022-10-13 MED ORDER — ACETAMINOPHEN 10 MG/ML IV SOLN
INTRAVENOUS | Status: DC | PRN
  Administered 2022-10-13: 1000 mg via INTRAVENOUS

## 2022-10-13 MED ORDER — DIPHENHYDRAMINE HCL 25 MG PO CAPS
25.0000 mg | ORAL_CAPSULE | ORAL | Status: DC | PRN
  Filled 2022-10-13: qty 1

## 2022-10-13 MED ORDER — CYCLOBENZAPRINE HCL 10 MG PO TABS
ORAL_TABLET | ORAL | Status: AC
  Filled 2022-10-13: qty 1

## 2022-10-13 MED ORDER — LIDOCAINE 2% (20 MG/ML) 5 ML SYRINGE
INTRAMUSCULAR | Status: AC
  Filled 2022-10-13: qty 5

## 2022-10-13 MED ORDER — PROPOFOL 500 MG/50ML IV EMUL
INTRAVENOUS | Status: DC | PRN
  Administered 2022-10-13: 90 ug/kg/min via INTRAVENOUS

## 2022-10-13 MED ORDER — THROMBIN 5000 UNITS EX SOLR
CUTANEOUS | Status: AC
  Filled 2022-10-13: qty 5000

## 2022-10-13 MED ORDER — ONDANSETRON HCL 4 MG/2ML IJ SOLN: 4.0000 mg | Freq: Once | INTRAMUSCULAR | Status: DC | PRN

## 2022-10-13 MED ORDER — PANTOPRAZOLE SODIUM 40 MG IV SOLR: 40.0000 mg | Freq: Every day | INTRAVENOUS | Status: DC

## 2022-10-13 MED ORDER — DEXMEDETOMIDINE HCL IN NACL 80 MCG/20ML IV SOLN
INTRAVENOUS | Status: DC | PRN
  Administered 2022-10-13: 8 ug via BUCCAL
  Administered 2022-10-13: 4 ug via BUCCAL
  Administered 2022-10-13: 8 ug via BUCCAL

## 2022-10-13 MED ORDER — FENTANYL CITRATE (PF) 250 MCG/5ML IJ SOLN
INTRAMUSCULAR | Status: DC | PRN
  Administered 2022-10-13: 50 ug via INTRAVENOUS
  Administered 2022-10-13 (×2): 100 ug via INTRAVENOUS

## 2022-10-13 MED ORDER — HYDROCORTISONE SOD SUC (PF) 1000 MG IJ SOLR
INTRAMUSCULAR | Status: DC | PRN
  Administered 2022-10-13: 125 mg via INTRAVENOUS

## 2022-10-13 MED ORDER — ACETAMINOPHEN 325 MG PO TABS: 650.0000 mg | ORAL_TABLET | ORAL | Status: DC | PRN

## 2022-10-13 MED ORDER — HYDROMORPHONE HCL 1 MG/ML IJ SOLN
0.2500 mg | INTRAMUSCULAR | Status: DC | PRN
  Administered 2022-10-13 (×3): 0.5 mg via INTRAVENOUS

## 2022-10-13 MED ORDER — NITROGLYCERIN 0.4 MG SL SUBL: 0.4000 mg | SUBLINGUAL_TABLET | SUBLINGUAL | Status: DC | PRN

## 2022-10-13 MED ORDER — SODIUM CHLORIDE 0.9% FLUSH: 3.0000 mL | INTRAVENOUS | Status: DC | PRN

## 2022-10-13 MED ORDER — SERTRALINE HCL 50 MG PO TABS: 50.0000 mg | ORAL_TABLET | Freq: Every day | ORAL | Status: DC

## 2022-10-13 MED ORDER — CEFAZOLIN SODIUM-DEXTROSE 2-3 GM-%(50ML) IV SOLR
INTRAVENOUS | Status: DC | PRN
  Administered 2022-10-13: 2 g via INTRAVENOUS

## 2022-10-13 MED ORDER — ORAL CARE MOUTH RINSE: 15.0000 mL | Freq: Once | OROMUCOSAL | Status: AC

## 2022-10-13 MED ORDER — ONDANSETRON HCL 4 MG/2ML IJ SOLN: 4.0000 mg | Freq: Four times a day (QID) | INTRAMUSCULAR | Status: DC | PRN

## 2022-10-13 MED ORDER — SUGAMMADEX SODIUM 200 MG/2ML IV SOLN
INTRAVENOUS | Status: DC | PRN
  Administered 2022-10-13: 200 mg via INTRAVENOUS

## 2022-10-13 MED ORDER — PROPOFOL 1000 MG/100ML IV EMUL
INTRAVENOUS | Status: AC
  Filled 2022-10-13: qty 100

## 2022-10-13 MED ORDER — ARFORMOTEROL TARTRATE 15 MCG/2ML IN NEBU
15.0000 ug | INHALATION_SOLUTION | Freq: Two times a day (BID) | RESPIRATORY_TRACT | Status: DC
  Administered 2022-10-13 – 2022-10-14 (×2): 15 ug via RESPIRATORY_TRACT
  Filled 2022-10-13 (×3): qty 2

## 2022-10-13 MED ORDER — THROMBIN 5000 UNITS EX SOLR: OROMUCOSAL | Status: DC | PRN

## 2022-10-13 MED ORDER — OXYCODONE HCL 5 MG PO TABS: 5.0000 mg | ORAL_TABLET | Freq: Once | ORAL | Status: DC | PRN

## 2022-10-13 MED ORDER — CEFAZOLIN SODIUM-DEXTROSE 2-4 GM/100ML-% IV SOLN
2.0000 g | Freq: Three times a day (TID) | INTRAVENOUS | Status: AC
  Administered 2022-10-13 (×2): 2 g via INTRAVENOUS
  Filled 2022-10-13 (×2): qty 100

## 2022-10-13 MED ORDER — FENTANYL CITRATE (PF) 250 MCG/5ML IJ SOLN
INTRAMUSCULAR | Status: AC
  Filled 2022-10-13: qty 5

## 2022-10-13 MED ORDER — ACETAMINOPHEN 500 MG PO TABS: 1000.0000 mg | ORAL_TABLET | Freq: Four times a day (QID) | ORAL | Status: DC | PRN

## 2022-10-13 MED ORDER — 0.9 % SODIUM CHLORIDE (POUR BTL) OPTIME
TOPICAL | Status: DC | PRN
  Administered 2022-10-13: 1000 mL

## 2022-10-13 MED ORDER — SCOPOLAMINE 1 MG/3DAYS TD PT72
MEDICATED_PATCH | TRANSDERMAL | Status: AC
  Filled 2022-10-13: qty 1

## 2022-10-13 MED ORDER — GABAPENTIN 100 MG PO CAPS
100.0000 mg | ORAL_CAPSULE | Freq: Three times a day (TID) | ORAL | Status: DC
  Administered 2022-10-13 (×3): 100 mg via ORAL
  Filled 2022-10-13 (×3): qty 1

## 2022-10-13 MED ORDER — OXYCODONE HCL 5 MG/5ML PO SOLN: 5.0000 mg | Freq: Once | ORAL | Status: DC | PRN

## 2022-10-13 MED ORDER — ALBUTEROL SULFATE (2.5 MG/3ML) 0.083% IN NEBU: 2.5000 mg | INHALATION_SOLUTION | Freq: Four times a day (QID) | RESPIRATORY_TRACT | Status: DC | PRN

## 2022-10-13 MED ORDER — PROPOFOL 10 MG/ML IV BOLUS
INTRAVENOUS | Status: AC
  Filled 2022-10-13: qty 20

## 2022-10-13 MED ORDER — DROPERIDOL 2.5 MG/ML IJ SOLN
INTRAMUSCULAR | Status: DC | PRN
  Administered 2022-10-13: .625 mg via INTRAVENOUS

## 2022-10-13 MED ORDER — MENTHOL 3 MG MT LOZG
1.0000 | LOZENGE | OROMUCOSAL | Status: DC | PRN
  Filled 2022-10-13: qty 9

## 2022-10-13 MED ORDER — MIDAZOLAM HCL 2 MG/2ML IJ SOLN
INTRAMUSCULAR | Status: DC | PRN
  Administered 2022-10-13: 1 mg via INTRAVENOUS

## 2022-10-13 MED ORDER — HYDROMORPHONE HCL 1 MG/ML IJ SOLN: 0.5000 mg | INTRAMUSCULAR | Status: DC | PRN

## 2022-10-13 MED ORDER — MIDAZOLAM HCL 2 MG/2ML IJ SOLN
INTRAMUSCULAR | Status: AC
  Filled 2022-10-13: qty 2

## 2022-10-13 MED ORDER — ROCURONIUM BROMIDE 10 MG/ML (PF) SYRINGE
PREFILLED_SYRINGE | INTRAVENOUS | Status: DC | PRN
  Administered 2022-10-13: 20 mg via INTRAVENOUS
  Administered 2022-10-13: 10 mg via INTRAVENOUS
  Administered 2022-10-13: 80 mg via INTRAVENOUS
  Administered 2022-10-13: 20 mg via INTRAVENOUS

## 2022-10-13 MED ORDER — PHENOL 1.4 % MT LIQD
1.0000 | OROMUCOSAL | Status: DC | PRN
  Administered 2022-10-13: 1 via OROMUCOSAL
  Filled 2022-10-13: qty 177

## 2022-10-13 MED ORDER — SODIUM CHLORIDE 0.9 % IV SOLN
250.0000 mL | INTRAVENOUS | Status: DC
  Administered 2022-10-13: 250 mL via INTRAVENOUS

## 2022-10-13 MED ORDER — ONDANSETRON HCL 4 MG PO TABS: 4.0000 mg | ORAL_TABLET | Freq: Four times a day (QID) | ORAL | Status: DC | PRN

## 2022-10-13 MED ORDER — HYDROCODONE-ACETAMINOPHEN 5-325 MG PO TABS
2.0000 | ORAL_TABLET | ORAL | Status: DC | PRN
  Administered 2022-10-13 – 2022-10-14 (×5): 2 via ORAL
  Filled 2022-10-13 (×5): qty 2

## 2022-10-13 MED ORDER — LACTATED RINGERS IV SOLN: INTRAVENOUS | Status: DC

## 2022-10-13 MED ORDER — ROSUVASTATIN CALCIUM 20 MG PO TABS: 20.0000 mg | ORAL_TABLET | Freq: Every day | ORAL | Status: DC

## 2022-10-13 MED ORDER — UMECLIDINIUM BROMIDE 62.5 MCG/ACT IN AEPB
1.0000 | INHALATION_SPRAY | Freq: Every day | RESPIRATORY_TRACT | Status: DC
  Administered 2022-10-14: 1 via RESPIRATORY_TRACT
  Filled 2022-10-13: qty 7

## 2022-10-13 MED ORDER — HYDROCORTISONE SOD SUC (PF) 250 MG IJ SOLR
INTRAMUSCULAR | Status: AC
  Filled 2022-10-13: qty 250

## 2022-10-13 MED ORDER — ACETAMINOPHEN 10 MG/ML IV SOLN
INTRAVENOUS | Status: AC
  Filled 2022-10-13: qty 100

## 2022-10-13 SURGICAL SUPPLY — 55 items
BAG COUNTER SPONGE SURGICOUNT (BAG) ×1 IMPLANT
BAND RUBBER #18 3X1/16 STRL (MISCELLANEOUS) ×2 IMPLANT
BASKET BONE COLLECTION (BASKET) ×1 IMPLANT
BENZOIN TINCTURE PRP APPL 2/3 (GAUZE/BANDAGES/DRESSINGS) ×1 IMPLANT
BIT DRILL NEURO 2X3.1 SFT TUCH (MISCELLANEOUS) ×1 IMPLANT
BONE CC-ACS 11X14X7 6D (Bone Implant) ×2 IMPLANT
BUR MATCHSTICK NEURO 3.0 LAGG (BURR) ×1 IMPLANT
CANISTER SUCT 3000ML PPV (MISCELLANEOUS) ×1 IMPLANT
CHIPS BONE CANC-ACS11X14X7 6D (Bone Implant) IMPLANT
DERMABOND ADVANCED .7 DNX12 (GAUZE/BANDAGES/DRESSINGS) IMPLANT
DRAPE C-ARM 42X72 X-RAY (DRAPES) ×2 IMPLANT
DRAPE LAPAROTOMY 100X72 PEDS (DRAPES) ×1 IMPLANT
DRAPE MICROSCOPE SLANT 54X150 (MISCELLANEOUS) ×1 IMPLANT
DRILL NEURO 2X3.1 SOFT TOUCH (MISCELLANEOUS) ×1
DRSG OPSITE POSTOP 3X4 (GAUZE/BANDAGES/DRESSINGS) IMPLANT
DURAPREP 6ML APPLICATOR 50/CS (WOUND CARE) ×1 IMPLANT
ELECT COATED BLADE 2.86 ST (ELECTRODE) ×1 IMPLANT
ELECT REM PT RETURN 9FT ADLT (ELECTROSURGICAL) ×1
ELECTRODE REM PT RTRN 9FT ADLT (ELECTROSURGICAL) ×1 IMPLANT
GAUZE 4X4 16PLY ~~LOC~~+RFID DBL (SPONGE) IMPLANT
GLOVE BIO SURGEON STRL SZ7 (GLOVE) IMPLANT
GLOVE BIO SURGEON STRL SZ8 (GLOVE) ×1 IMPLANT
GLOVE BIOGEL PI IND STRL 7.0 (GLOVE) IMPLANT
GLOVE EXAM NITRILE XL STR (GLOVE) IMPLANT
GLOVE INDICATOR 8.5 STRL (GLOVE) ×2 IMPLANT
GOWN STRL REUS W/ TWL LRG LVL3 (GOWN DISPOSABLE) ×1 IMPLANT
GOWN STRL REUS W/ TWL XL LVL3 (GOWN DISPOSABLE) ×1 IMPLANT
GOWN STRL REUS W/TWL 2XL LVL3 (GOWN DISPOSABLE) ×1 IMPLANT
GOWN STRL REUS W/TWL LRG LVL3 (GOWN DISPOSABLE) ×1
GOWN STRL REUS W/TWL XL LVL3 (GOWN DISPOSABLE) ×1
HALTER HD/CHIN CERV TRACTION D (MISCELLANEOUS) ×1 IMPLANT
HEMOSTAT POWDER KIT SURGIFOAM (HEMOSTASIS) ×1 IMPLANT
KIT BASIN OR (CUSTOM PROCEDURE TRAY) ×1 IMPLANT
KIT TURNOVER KIT B (KITS) ×1 IMPLANT
NDL HYPO 18GX1.5 BLUNT FILL (NEEDLE) ×1 IMPLANT
NDL SPNL 20GX3.5 QUINCKE YW (NEEDLE) ×1 IMPLANT
NEEDLE HYPO 18GX1.5 BLUNT FILL (NEEDLE) ×1 IMPLANT
NEEDLE SPNL 20GX3.5 QUINCKE YW (NEEDLE) ×1 IMPLANT
NS IRRIG 1000ML POUR BTL (IV SOLUTION) ×1 IMPLANT
PACK LAMINECTOMY NEURO (CUSTOM PROCEDURE TRAY) ×1 IMPLANT
PAD ARMBOARD 7.5X6 YLW CONV (MISCELLANEOUS) ×3 IMPLANT
PLATE CERV RES 34 2L (Plate) IMPLANT
RASP HELIOCORDIAL MED (MISCELLANEOUS) IMPLANT
SCREW VA SD 4.2X15 (Screw) IMPLANT
SCREW VA SD 4.6X15 (Screw) IMPLANT
SPIKE FLUID TRANSFER (MISCELLANEOUS) ×1 IMPLANT
SPONGE INTESTINAL PEANUT (DISPOSABLE) ×1 IMPLANT
SPONGE SURGIFOAM ABS GEL SZ50 (HEMOSTASIS) ×1 IMPLANT
STRIP CLOSURE SKIN 1/2X4 (GAUZE/BANDAGES/DRESSINGS) ×1 IMPLANT
SUT VIC AB 3-0 SH 8-18 (SUTURE) ×1 IMPLANT
SUT VIC AB 4-0 PS2 27 (SUTURE) ×1 IMPLANT
TAPE CLOTH 4X10 WHT NS (GAUZE/BANDAGES/DRESSINGS) ×1 IMPLANT
TOWEL GREEN STERILE (TOWEL DISPOSABLE) ×1 IMPLANT
TOWEL GREEN STERILE FF (TOWEL DISPOSABLE) ×1 IMPLANT
WATER STERILE IRR 1000ML POUR (IV SOLUTION) ×1 IMPLANT

## 2022-10-13 NOTE — Transfer of Care (Signed)
Immediate Anesthesia Transfer of Care Note  Patient: Christopher [name]  Procedure(s) Performed: ANTERIOR CERVICAL DISECTOMY FUSION - CERVICAL THREE-CERVICAL FOUR - CERVICAL FOUR-CERVICAL FIVE REMOVAL OF HARDWARE CERVICAL FIVE-CERVICAL SIX  Patient Location: PACU  Anesthesia Type:General  Level of Consciousness: awake, drowsy, patient cooperative, and responds to stimulation  Airway & Oxygen Therapy: Patient Spontanous Breathing and Patient connected to face mask oxygen  Post-op Assessment: Report given to RN, Post -op Vital signs reviewed and stable, and Patient moving all extremities X 4  Post vital signs: Reviewed and stable  Last Vitals:  Vitals Value Taken Time  BP    Temp    Pulse 61 10/13/22 1050  Resp 19 10/13/22 1050  SpO2 96 % 10/13/22 1050  Vitals shown include unvalidated device data.  Last Pain:  Vitals:   10/13/22 0636  TempSrc:   PainSc: 9       Patients Stated Pain Goal: 3 ([id number])  Complications:  Encounter Notable Events  Notable Event Outcome Phase Comment  Difficult to intubate - expected  Intraprocedure Filed from anesthesia note documentation.

## 2022-10-13 NOTE — Anesthesia Postprocedure Evaluation (Signed)
Anesthesia Post Note  Patient: Christopher [name]  Procedure(s) Performed: ANTERIOR CERVICAL DISECTOMY FUSION - CERVICAL THREE-CERVICAL FOUR - CERVICAL FOUR-CERVICAL FIVE REMOVAL OF HARDWARE CERVICAL FIVE-CERVICAL [place]     Patient location during evaluation: PACU Anesthesia Type: General Level of consciousness: awake and alert Pain management: pain level controlled Vital Signs Assessment: post-procedure vital signs reviewed and stable Respiratory status: spontaneous breathing, nonlabored ventilation, respiratory function stable and patient connected to nasal cannula oxygen Cardiovascular status: blood pressure returned to baseline and stable Postop Assessment: no apparent nausea or vomiting Anesthetic complications: yes  Encounter Notable Events  Notable Event Outcome Phase Comment  Difficult to intubate - expected  Intraprocedure Filed from anesthesia note documentation.    Last Vitals:  Vitals:   10/13/22 1146 10/13/22 1610  BP: 116/86 (!) 147/73  Pulse: 62 (!) 59  Resp:  16  Temp: 36.9 C 36.8 C  SpO2: 95% 95%    Last Pain:  Vitals:   10/13/22 1610  TempSrc: Oral  PainSc:                  Bader Stubblefield S

## 2022-10-13 NOTE — Op Note (Signed)
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  Postoperative diagnosis: Same.  Procedure: Anterior cervical discectomies and fusion C3-4 C4-5 utilizing allograft spacers and the globus resonate plating system with removal of Synthes CLP plate from 075-GRM.  Surgeon: Kary Kos.  Assistant: Nash Shearer.  Anesthesia: General  EBL minimal.  HPI: 67 year old gentleman progressive worsening neck bilateral shoulder and arm pain worse on the right workup revealed severe cervical spondylosis breakdown C3-4 C4-5 above the level of his previous fusion at C5-6.  Due to the patient's progressive clinical syndrome imaging findings of a conservative treatment I recommended anterior cervical discectomy and fusion at those 2 levels with removal of hardware.  I extensively reviewed the risks and benefits of the operation with him as well as perioperative course expectations of outcome and alternatives of surgery and he understood and agreed to proceed forward.Marland Kitchen  Operative procedure: Patient was brought into the OR was induced under general anesthesia positioned supine the neck in slight extension 5 pounds halter traction.  The right side of his neck was prepped and draped in routine sterile fashion.  Preoperative x-ray localized the appropriate level.  So a curvilinear incision was made just off midline to the end border of the sternocleidomastoid and the superficial layer of the platysma was dissected out divided longitudinally.  The avascular plane between the sternomastoid and strap muscle was developed down to the prevertebral fascia and prevertebral fascia was dissected away with Kitners.  I immediately identified the old plate this was dissected free screws were removed however the plate was somewhat welded into the vertebral bodies were left at and then started perform the discectomies.  Large anterior osteophytes were bitten off at the 3 4 and 4 5 disc base levels  longus was reflected laterally self-retaining retractors were placed under microscopic illumination both disc bases were drilled down and first working at C3-4 aggressive under biting both endplates allowed identification and removal of the posterior large ligament and posterior osteophytic complex primarily coming off the C3 vertebral body.  Marching laterally B both C4 nerve roots were identified and skeletonized decompressed flush the pedicle.  This was then packed with Gelfoam tension taken at C4-5 in a similar fashion pathology here that was a low large soft disc herniation this was all removed aggressive under biting of both endplates decompress central canal marching laterally both C5 nerve roots were identified and both C5 nerve roots were decompressed and skeletonized flush with the pedicle.  I then inserted two 7 mm allograft spacers at C3-4 and C4-5 sized up and appropriate confirmed by fluoroscopy.  I then worked on removal of the plate the plate was very stuck to the C5 and C6 vertebral bodies I was unable to freed up so I drilled off the middle of the plate and this allowed freeing it up further and I was able to remove both pieces of the plate after drilling through the middle.  Then utilizing the old rescue unit holes I used a 44 mm globus resonate plate and all screws had excellent purchase locking mechanisms were engaged.  Wound was then copiously irrigated meticulous hemostasis was maintained the wound was then closed in layers with active Vicryl in the platysma and a running 4 subcuticular Dermabond benzoin Steri-Strips and a sterile dressing was applied patient recovery in stable condition.  At the end the case all needle count sponge counts were correct.

## 2022-10-13 NOTE — Anesthesia Procedure Notes (Signed)
Procedure Name: Intubation Date/Time: 10/13/2022 8:06 AM  Performed by: Cathren Harsh, CRNAPre-anesthesia Checklist: Patient identified, Emergency Drugs available, Suction available and Patient being monitored Patient Re-evaluated:Patient Re-evaluated prior to induction Oxygen Delivery Method: Circle System Utilized Preoxygenation: Pre-oxygenation with 100% oxygen Induction Type: IV induction Ventilation: Mask ventilation without difficulty Laryngoscope Size: Glidescope and 4 Grade View: Grade I Tube type: Oral Tube size: 7.5 mm Number of attempts: 1 Airway Equipment and Method: Stylet and Oral airway Placement Confirmation: ETT inserted through vocal cords under direct vision, positive ETCO2 and breath sounds checked- equal and bilateral Secured at: 23 cm Tube secured with: Tape Dental Injury: Teeth and Oropharynx as per pre-operative assessment  Difficulty Due To: Difficulty was anticipated and Difficult Airway- due to reduced neck mobility

## 2022-10-13 NOTE — H&P (Signed)
Christopher [name] is an 67 y.o. male.   Chief Complaint: Back and right shoulder and arm pain HPI: 67 year old gentleman previous history of ACDF C5-6 presents with progressive worsening right shoulder pain.  Workup revealed progressive spondylosis above his fusion at C3 C4-5.  Due to patient's progression of clinical syndrome imaging findings with surgery and I recommended anterior cervical discectomies and fusion at those 2 levels.  I extensively reviewed the risks and benefits of the operation with him as well as perioperative course expectations of outcome and alternatives of surgery and he understood and agreed to proceed forward.  Past Medical History:  Diagnosis Date   Anxiety    Arthritis    Asthma    BPH (benign prostatic hyperplasia)    Complication of anesthesia    pt had a hard time being able to move after spinal anesthesia , 3-4 hours   Depression    GERD (gastroesophageal reflux disease)    Gout    no meds   Headache(784.0)    otc meds prn   Heart murmur    dx as a child, no problems as an adult   History of hiatal hernia    Hyperlipidemia    IBS (irritable bowel syndrome)    PONV (postoperative nausea and vomiting)    Pre-diabetes    Borderline, diet and exercise, no med   Sleep apnea    uses CIPAP machine at night   Wears partial dentures    bottom partial    Past Surgical History:  Procedure Laterality Date   APPENDECTOMY     BACK SURGERY  2002   neck and back fusion   BIOPSY  12/27/2015   Procedure: BIOPSY;  Surgeon: Rogene Houston, MD;  [facility];  Service: Endoscopy;;  Fundus biopsies and duodenal biopsies   BIOPSY  02/10/2020   Procedure: BIOPSY;  Surgeon: Rogene Houston, MD;  [facility];  Service: Endoscopy;;  antral   CARDIAC CATHETERIZATION     CARDIAC CATHETERIZATION N/A 09/04/2016   Procedure: Right/Left Heart Cath and Coronary Angiography;  Surgeon: Peter M Martinique, MD;  Location: [facility];  Service:  Cardiovascular;  Laterality: N/A;   CHOLECYSTECTOMY     CHONDROPLASTY  08/15/2011   Procedure: CHONDROPLASTY;  Surgeon: [name], MD;  [facility]: [facility];  Service: Orthopedics;  Laterality: Left;   COLONOSCOPY  06/27/2011   Procedure: COLONOSCOPY;  Surgeon: Rogene Houston, MD;  [facility];  Service: Endoscopy;  Laterality: N/A;  9:00 / Pt to be here at 9am for 10:45 procedure, benign polyps removed   COLONOSCOPY N/A 10/05/2014   Procedure: COLONOSCOPY;  Surgeon: Rogene Houston, MD;  [facility];  Service: Endoscopy;  Laterality: N/A;  930   COLONOSCOPY N/A 02/11/2018   Procedure: COLONOSCOPY;  Surgeon: Rogene Houston, MD;  [facility];  Service: Endoscopy;  Laterality: N/A;  830   ESOPHAGOGASTRODUODENOSCOPY N/A 12/27/2015   Procedure: ESOPHAGOGASTRODUODENOSCOPY (EGD);  Surgeon: Rogene Houston, MD;  [facility];  Service: Endoscopy;  Laterality: N/A;  3:00   ESOPHAGOGASTRODUODENOSCOPY (EGD) WITH PROPOFOL N/A 02/10/2020   Procedure: ESOPHAGOGASTRODUODENOSCOPY (EGD) WITH PROPOFOL;  Surgeon: Rogene Houston, MD;  [facility];  Service: Endoscopy;  Laterality: N/A;  155   HERNIA REPAIR     umbilical hernia   JOINT REPLACEMENT     knee and shoulder   KNEE ARTHROSCOPY     left knee   KNEE ARTHROSCOPY  right knee    LUMBAR LAMINECTOMY/DECOMPRESSION MICRODISCECTOMY  09/14/2012   Procedure: LUMBAR LAMINECTOMY/DECOMPRESSION MICRODISCECTOMY 1 LEVEL;  Surgeon: Floyce Stakes, MD;  Location: [place] [facility];  Service: Neurosurgery;  Laterality: Right;  Right Lumbar three-four Diskectomy   neck fusion  2005   PATELLA-FEMORAL ARTHROPLASTY Right 02/18/2022   Procedure: RIGHT KNEE PATELLA REPLACEMENT, CEMENTED;  Surgeon: Meredith Pel, MD;  Location: [place];  Service: Orthopedics;  Laterality: Right;   POLYPECTOMY  02/11/2018   Procedure: POLYPECTOMY;  Surgeon: Rogene Houston, MD;  [facility];  Service:  Endoscopy;;  colon   REVISION TOTAL SHOULDER TO REVERSE TOTAL SHOULDER Left 12/16/2018   Procedure: REVISION TOTAL SHOULDER TO REVERSE TOTAL SHOULDER;  Surgeon: Meredith Pel, MD;  Location: [place];  Service: Orthopedics;  Laterality: Left;   SHOULDER ARTHROSCOPY WITH BICEPSTENOTOMY Left 09/23/2013   Procedure: SHOULDER ARTHROSCOPY WITH BICEPSTENOTOMY AND EXTENSIVE DEBRIDEMENT;  Surgeon: Carole Civil, MD;  [facility];  Service: Orthopedics;  Laterality: Left;   SHOULDER ARTHROSCOPY WITH ROTATOR CUFF REPAIR Left 05/05/2014   Procedure: SHOULDER ARTHROSCOPY LIMITED DEBRIDEMENT;  Surgeon: Carole Civil, MD;  [facility];  Service: Orthopedics;  Laterality: Left;   SHOULDER OPEN ROTATOR CUFF REPAIR Left 05/05/2014   Procedure: ROTATOR CUFF REPAIR SHOULDER OPEN;  Surgeon: Carole Civil, MD;  [facility];  Service: Orthopedics;  Laterality: Left;   SHOULDER OPEN ROTATOR CUFF REPAIR Left 12/16/2018   Procedure: LEFT SHOULDER POSSIBLE SUBSCAPULARIS REPAIR VS. REVISION TO REVERSE TOTAL SHOULDER REPLACEMENT;  Surgeon: Meredith Pel, MD;  Location: [place];  Service: Orthopedics;  Laterality: Left;   SHOULDER SURGERY Right    Open [place] procedure   SPINAL FUSION     x2, 2003 and 2007   TOTAL KNEE ARTHROPLASTY Left 06/16/2017   Procedure: LEFT TOTAL KNEE ARTHROPLASTY;  Surgeon: Carole Civil, MD;  [facility];  Service: Orthopedics;  Laterality: Left;   TOTAL KNEE ARTHROPLASTY Right 05/14/2021   Procedure: TOTAL KNEE ARTHROPLASTY;  Surgeon: Carole Civil, MD;  [facility];  Service: Orthopedics;  Laterality: Right;   TOTAL SHOULDER ARTHROPLASTY Left 08/19/2018   Procedure: left shoulder replacement;  Surgeon: Meredith Pel, MD;  Location: [place];  Service: Orthopedics;  Laterality: Left;    Family History  Problem Relation Age of Onset   Alzheimer's disease Father    Diabetes Father    Diabetes Other    Lung disease Other     Arthritis Other    Anesthesia problems Neg Hx    Hypotension Neg Hx    Malignant hyperthermia Neg Hx    Pseudochol deficiency Neg Hx    Sleep apnea Neg Hx    Social History:  reports that he quit smoking about 7 weeks ago. His smoking use included cigarettes. He has a 44.00 pack-year smoking history. He has never used smokeless tobacco. He reports that he does not drink alcohol and does not use drugs.  Allergies:  Allergies  Allergen Reactions   Celebrex [Celecoxib] Itching, Swelling and Other (See Comments)    All over   Cortisone Swelling    SWELLING REACTION UNSPECIFIED    Doxycycline Swelling and Other (See Comments)    Made tongue turn black    Prednisone Swelling    SWELLING REACTION UNSPECIFIED    Relafen [Nabumetone] Swelling    SWELLING REACTION UNSPECIFIED    Codeine Nausea And Vomiting and Other (See Comments)    Extreme stomach pain. This includes anything with  the derivative of codeine in it. (Does tolerate hydrocodone)    Aspirin Itching    Stomach cramps   Oxycodone-Acetaminophen Itching and Other (See Comments)    Can tolerate with benadryl     Medications Prior to Admission  Medication Sig Dispense Refill   acetaminophen (TYLENOL) 500 MG tablet Take 1,000 mg by mouth every 6 (six) hours as needed for moderate pain.     EPINEPHrine 0.3 mg/0.3 mL IJ SOAJ injection Inject 0.3 mg into the muscle as needed for anaphylaxis.     gabapentin (NEURONTIN) 100 MG capsule Take 1 capsule (100 mg total) by mouth 3 (three) times daily as needed. 60 capsule 2   Menthol, Topical Analgesic, (ICY HOT EX) Apply 1 application topically daily as needed (pain).     methocarbamol (ROBAXIN) 500 MG tablet Take 500 mg by mouth every 8 (eight) hours as needed for muscle spasms.     nitroGLYCERIN (NITROSTAT) 0.4 MG SL tablet PLACE (1) TABLET UNDER TONGUE EVERY 5 MINUTES UP TO (3) DOSES. IF NO RELIEF CALL 911. 25 tablet 0   pantoprazole (PROTONIX) 40 MG tablet TAKE (1) TABLET TWICE A DAY  BEFORE MEALS. (Patient taking differently: Take 40 mg by mouth daily.) 60 tablet 0   pregabalin (LYRICA) 50 MG capsule Take 1 capsule (50 mg total) by mouth 2 (two) times daily. (Patient taking differently: Take 50 mg by mouth 2 (two) times daily as needed (pain).) 60 capsule 1   PROAIR HFA 108 (90 Base) MCG/ACT inhaler Inhale 2 puffs into the lungs every 6 (six) hours as needed for wheezing or shortness of breath. 6.7 g 2   rosuvastatin (CRESTOR) 20 MG tablet Take 1 tablet (20 mg total) by mouth daily. 90 tablet 3   sertraline (ZOLOFT) 50 MG tablet Take 50 mg by mouth in the morning.     cyclobenzaprine (FLEXERIL) 10 MG tablet Take 1 tablet (10 mg total) by mouth 3 (three) times daily as needed for muscle spasms. Do not take with Robaxin 30 tablet 1   furosemide (LASIX) 20 MG tablet Take 1 tablet (20 mg total) by mouth daily as needed (shortness of breath). 30 tablet 0   methylPREDNISolone (MEDROL DOSEPAK) 4 MG TBPK tablet Take as directed on the dosepack 21 tablet 0   Tiotropium Bromide-Olodaterol (STIOLTO RESPIMAT) 2.5-2.5 MCG/ACT AERS Inhale 2 puffs into the lungs daily. 1 each 5    Results for orders placed or performed during the hospital encounter of 10/13/22 (from the past 48 hour(s))  Type and screen [place]     Status: None (Preliminary result)   Collection Time: 10/13/22  6:50 AM  Result Value Ref Range   ABO/RH(D) PENDING    Antibody Screen PENDING    Sample Expiration      10/16/2022,2359 Performed at [facility], [place] [address] [place], [place] [address]    No results found.  Review of Systems  Musculoskeletal:  Positive for neck pain.  Neurological:  Positive for numbness.    Blood pressure (!) 141/81, pulse (!) 57, temperature 98.2 F (36.8 C), temperature source Oral, resp. rate 18, height 5' 6"$  (1.676 m), weight 109.3 kg, SpO2 97 %. Physical Exam HENT:     Head: Normocephalic.     Right Ear: Tympanic membrane normal.     Nose: Nose  normal.     Mouth/Throat:     Mouth: Mucous membranes are moist.  Eyes:     Pupils: Pupils are equal, round, and reactive to  light.  Cardiovascular:     Rate and Rhythm: Normal rate.     Pulses: Normal pulses.  Pulmonary:     Effort: Pulmonary effort is normal.  Abdominal:     General: Abdomen is flat.  Musculoskeletal:        General: Normal range of motion.  Skin:    General: Skin is warm.  Neurological:     Mental Status: He is alert.     Comments: Patient is awake alert strength is 5 out of 5 deltoid, bicep, tricep, wrist flexion, wrist extension, hand intrinsics.      Assessment/Plan 67 year old male presents for ACDF C3-4 C4-5 removal of hardware C5-6  Elaina Hoops, MD 10/13/2022, 7:30 AM

## 2022-10-14 ENCOUNTER — Ambulatory Visit: Payer: PPO | Admitting: Urology

## 2022-10-14 ENCOUNTER — Encounter (HOSPITAL_COMMUNITY): Payer: Self-pay | Admitting: Neurosurgery

## 2022-10-14 MED ORDER — CYCLOBENZAPRINE HCL 10 MG PO TABS: 10.0000 mg | ORAL_TABLET | Freq: Three times a day (TID) | ORAL | 1 refills | Status: DC | PRN

## 2022-10-14 MED ORDER — HYDROCODONE-ACETAMINOPHEN 5-325 MG PO TABS: 1.0000 | ORAL_TABLET | ORAL | 0 refills | Status: DC | PRN

## 2022-10-14 MED ORDER — METHYLPREDNISOLONE 4 MG PO TBPK: ORAL_TABLET | ORAL | 0 refills | Status: DC

## 2022-10-14 NOTE — Evaluation (Signed)
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   Clinical Impression   This 67 yo male s/p above presents to acute OT with all education completed, no further questions, and post op cervical surgery handout provided.  Pt will be D/C'd from acute OT and no skilled PT needs identified.      Recommendations for follow up therapy are one component of a multi-disciplinary discharge planning process, led by the attending physician.  Recommendations may be updated based on patient status, additional functional criteria and insurance authorization.   Follow Up Recommendations  No OT follow up     Assistance Recommended at Discharge None     Functional Status Assessment  Patient has had a recent decline in their functional status and demonstrates the ability to make significant improvements in function in a reasonable and predictable amount of time. (without further need for OT, all education completed)  Equipment Recommendations  None recommended by OT       Precautions / Restrictions Precautions Precautions: Cervical Precaution Booklet Issued: Yes (comment) Required Braces or Orthoses: Cervical Brace Cervical Brace: Soft collar Restrictions Weight Bearing Restrictions: No      Mobility Bed Mobility Overal bed mobility: Independent                  Transfers Overall transfer level: Independent                        Balance Overall balance assessment: Independent                                         ADL either performed or assessed with clinical judgement   ADL Overall ADL's : Modified  independent                                       General ADL Comments: Educated on use of straws for drinking from cups to keep from extending neck when near end of beverage, getting OOB, UB/LB dressing, use of handheld shower head or just standing under the water to get rinsed off (instead of turning and twisting), wear and care of collar.     Vision Patient Visual Report: No change from baseline              Pertinent Vitals/Pain Pain Assessment Pain Assessment: No/denies pain     Hand Dominance Right   Extremity/Trunk Assessment Upper Extremity Assessment Upper Extremity Assessment: Overall WFL for tasks assessed           Communication Communication Communication: No difficulties   Cognition Arousal/Alertness: Awake/alert Behavior During Therapy: WFL for tasks assessed/performed Overall Cognitive Status: Within Functional Limits for tasks assessed                                                  Home Living Family/patient expects to be discharged to::  Private residence Living Arrangements: Spouse/significant other Available Help at Discharge: Family;Available 24 hours/day Type of Home: Mobile home Home Access: Ramped entrance     Home Layout: One level     Bathroom Shower/Tub: Occupational psychologist: Handicapped height     Home Equipment: Conservation officer, nature (2 wheels)          Prior Functioning/Environment Prior Level of Function : Independent/Modified Independent             Mobility Comments: Retired                   Advertising account planner goals can be found in the care plan section) Acute Rehab OT Goals Patient Stated Goal: to go home today         AM-PAC OT "6 Clicks" Daily Activity     Outcome Measure Help from another person eating meals?: None Help from another person taking care of personal grooming?: None Help from another person toileting, which includes using toliet, bedpan, or  urinal?: None Help from another person bathing (including washing, rinsing, drying)?: None Help from another person to put on and taking off regular upper body clothing?: None Help from another person to put on and taking off regular lower body clothing?: None 6 Click Score: 24   End of Session Equipment Utilized During Treatment: Cervical collar Nurse Communication:  (pt ready to go)  Activity Tolerance: Patient tolerated treatment well Patient left:  (in room up and about gathering his items for home)  OT Visit Diagnosis: Muscle weakness (generalized) (M62.81)                Time: [id number] OT Time Calculation (min): 15 min Charges:  OT General Charges $OT Visit: 1 Visit OT Evaluation $OT Eval Moderate Complexity: 1 Mod [place] Office [phone]    Almon Register 10/14/2022, 9:32 AM

## 2022-10-14 NOTE — Discharge Summary (Signed)
Physician Discharge Summary  Patient ID: Christopher [name] MRN: [id number] DOB/AGE: Jun 09, 1956 67 y.o.  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     Discharge Diagnoses: same   Discharged Condition: good  Hospital Course: The patient was admitted on 10/13/2022 and taken to the operating room where the patient underwent acdf C3-4, C4-5. The patient tolerated the procedure well and was taken to the recovery room and then to the floor in stable condition. The hospital course was routine. There were no complications. The wound remained clean dry and intact. Pt had appropriate neck soreness. No complaints of arm pain or new N/T/W. The patient remained afebrile with stable vital signs, and tolerated a regular diet. The patient continued to increase activities, and pain was well controlled with oral pain medications.   Consults: None  Significant Diagnostic Studies:  Results for orders placed or performed during the hospital encounter of 10/13/22  Type and screen [place]  Result Value Ref Range   ABO/RH(D) B POS    Antibody Screen NEG    Sample Expiration      10/16/2022,2359 Performed at [facility], [place] [address], [place], [place] [address]     DG Cervical Spine 1 View  Result Date: 10/13/2022 CLINICAL DATA:  Anterior cervical discectomy and fusion. EXAM: DG CERVICAL SPINE - 1 VIEW COMPARISON:  MR cervical spine 08/28/2022 and cervical spine series 07/20/2020. FINDINGS: 2 intraoperative fluoroscopic spot views of the cervical spine are provided in the lateral projection. Anterior plate is seen along C3, C4 and C5. Vertebral body screws are visualized at C3 and C4. Interbody spacer at C3-4. C5-6 and below are poorly visualized due to the patient's shoulders. IMPRESSION: Limited visualization of anterior cervical fusion involving the C3 through C5 levels, as detailed above.  Electronically Signed   By: Lorin Picket M.D.   On: 10/13/2022 10:50   DG C-Arm 1-60 Min-No Report  Result Date: 10/13/2022 Fluoroscopy was utilized by the requesting physician.  No radiographic interpretation.   DG C-Arm 1-60 Min-No Report  Result Date: 10/13/2022 Fluoroscopy was utilized by the requesting physician.  No radiographic interpretation.   DG C-Arm 1-60 Min-No Report  Result Date: 10/13/2022 Fluoroscopy was utilized by the requesting physician.  No radiographic interpretation.    Antibiotics:  Anti-infectives (From admission, onward)    Start     Dose/Rate Route Frequency Ordered Stop   10/13/22 1600  ceFAZolin (ANCEF) IVPB 2g/100 mL premix        2 g 200 mL/hr over 30 Minutes Intravenous Every 8 hours 10/13/22 1126 10/14/22 0000       Discharge Exam: Blood pressure (!) 147/71, pulse (!) 59, temperature 98.9 F (37.2 C), temperature source Oral, resp. rate 14, height '5\' 6"'$  (1.676 m), weight 109.3 kg, SpO2 96 %. Neurologic: Grossly normal Ambulating and voiding well incision cdi   Discharge Medications:   Allergies as of 10/14/2022       Reactions   Celebrex [celecoxib] Itching, Swelling, Other (See Comments)   All over   Cortisone Swelling   SWELLING REACTION UNSPECIFIED    Doxycycline Swelling, Other (See Comments)   Made tongue turn black    Prednisone Swelling   SWELLING REACTION UNSPECIFIED    Relafen [nabumetone] Swelling   SWELLING REACTION UNSPECIFIED    Codeine Nausea And Vomiting, Other (See Comments)   Extreme stomach pain. This includes anything with the derivative of codeine in it. (Does  tolerate hydrocodone)    Aspirin Itching   Stomach cramps   Oxycodone-acetaminophen Itching, Other (See Comments)   Can tolerate with benadryl         Medication List     STOP taking these medications    methocarbamol 500 MG tablet Commonly known as: ROBAXIN       TAKE these medications    acetaminophen 500 MG tablet Commonly known as:  TYLENOL Take 1,000 mg by mouth every 6 (six) hours as needed for moderate pain.   cyclobenzaprine 10 MG tablet Commonly known as: FLEXERIL Take 1 tablet (10 mg total) by mouth 3 (three) times daily as needed for muscle spasms. Do not take with Robaxin   EPINEPHrine 0.3 mg/0.3 mL Soaj injection Commonly known as: EPI-PEN Inject 0.3 mg into the muscle as needed for anaphylaxis.   furosemide 20 MG tablet Commonly known as: LASIX Take 1 tablet (20 mg total) by mouth daily as needed (shortness of breath).   gabapentin 100 MG capsule Commonly known as: NEURONTIN Take 1 capsule (100 mg total) by mouth 3 (three) times daily as needed.   HYDROcodone-acetaminophen 5-325 MG tablet Commonly known as: NORCO/VICODIN Take 1 tablet by mouth every 4 (four) hours as needed for moderate pain.   ICY HOT EX Apply 1 application topically daily as needed (pain).   methylPREDNISolone 4 MG Tbpk tablet Commonly known as: MEDROL DOSEPAK Take as directed on the dosepack   nitroGLYCERIN 0.4 MG SL tablet Commonly known as: NITROSTAT PLACE (1) TABLET UNDER TONGUE EVERY 5 MINUTES UP TO (3) DOSES. IF NO RELIEF CALL 911.   pantoprazole 40 MG tablet Commonly known as: PROTONIX TAKE (1) TABLET TWICE A DAY BEFORE MEALS. What changed: See the new instructions.   pregabalin 50 MG capsule Commonly known as: LYRICA Take 1 capsule (50 mg total) by mouth 2 (two) times daily. What changed:  when to take this reasons to take this   ProAir HFA 108 (90 Base) MCG/ACT inhaler Generic drug: albuterol Inhale 2 puffs into the lungs every 6 (six) hours as needed for wheezing or shortness of breath.   rosuvastatin 20 MG tablet Commonly known as: Crestor Take 1 tablet (20 mg total) by mouth daily.   sertraline 50 MG tablet Commonly known as: ZOLOFT Take 50 mg by mouth in the morning.   Stiolto Respimat 2.5-2.5 MCG/ACT Aers Generic drug: Tiotropium Bromide-Olodaterol Inhale 2 puffs into the lungs daily.         Disposition: home   Final Dx: acdf C3-4, C4-5  Discharge Instructions     Call MD for:  difficulty breathing, headache or visual disturbances   Complete by: As directed    Call MD for:  hives   Complete by: As directed    Call MD for:  persistant nausea and vomiting   Complete by: As directed    Call MD for:  redness, tenderness, or signs of infection (pain, swelling, redness, odor or green/yellow discharge around incision site)   Complete by: As directed    Call MD for:  severe uncontrolled pain   Complete by: As directed    Call MD for:  temperature >100.4   Complete by: As directed    Diet - low sodium heart healthy   Complete by: As directed    Driving Restrictions   Complete by: As directed    No driving for 2 weeks, no riding in the car for 1 week   Increase activity slowly   Complete by: As directed  Lifting restrictions   Complete by: As directed    No lifting more than 8 lbs          Signed: Ocie Cornfield [facility] 10/14/2022, 7:42 AM

## 2022-10-14 NOTE — Progress Notes (Signed)
Patient alert and oriented, mae's well, voiding adequate amount of urine, swallowing without difficulty, no c/o pain at time of discharge. Patient discharged home with family. Script and discharged instructions given to patient. Patient and family stated understanding of instructions given. Patient has an appointment with Dr. Cram 

## 2022-10-15 ENCOUNTER — Telehealth: Payer: Self-pay

## 2022-10-15 NOTE — Transitions of Care (Post Inpatient/ED Visit) (Signed)
   10/15/2022  Name: Christopher [name] MRN: [id number] DOB: [birth year]  Today's TOC FU Call Status: Today's TOC FU Call Status:: Successful TOC FU Call Competed TOC FU Call Complete Date: 10/15/22  Transition Care Management Follow-up Telephone Call Date of Discharge: 10/14/22 Discharge Facility: [facility] Type of Discharge: Inpatient Admission How have you been since you were released from the hospital?: Better Any questions or concerns?: No  Items Reviewed: Did you receive and understand the discharge instructions provided?: Yes Medications obtained and verified?: Yes (Medications Reviewed) Any new allergies since your discharge?: No Dietary orders reviewed?: NA Do you have support at home?: Yes People in Home: spouse  Home Care and Equipment/Supplies: [place] Ordered?: NA Any new equipment or medical supplies ordered?: NA  Functional Questionnaire: Do you need assistance with bathing/showering or dressing?: No Do you need assistance with meal preparation?: Yes Do you need assistance with eating?: No Do you have difficulty maintaining continence: No Do you need assistance with getting out of bed/getting out of a chair/moving?: No Do you have difficulty managing or taking your medications?: No  Folllow up appointments reviewed: PCP Follow-up appointment confirmed?: NA Specialist Hospital Follow-up appointment confirmed?: Yes Date of Specialist follow-up appointment?: 10/28/22 Follow-Up Specialty Provider:: Dr Saintclair Halsted Do you need transportation to your follow-up appointment?: No Do you understand care options if your condition(s) worsen?: Yes-patient verbalized understanding    [place], [place] Nurse Health Advisor Direct Dial [phone]

## 2022-10-21 NOTE — Progress Notes (Signed)
Letter sent.

## 2022-10-22 ENCOUNTER — Encounter: Payer: Self-pay | Admitting: Internal Medicine

## 2022-10-22 ENCOUNTER — Ambulatory Visit (INDEPENDENT_AMBULATORY_CARE_PROVIDER_SITE_OTHER): Payer: PPO | Admitting: Internal Medicine

## 2022-10-22 VITALS — BP 131/80 | HR 121 | Ht 66.0 in | Wt 234.8 lb

## 2022-10-22 DIAGNOSIS — R300 Dysuria: Secondary | ICD-10-CM | POA: Diagnosis not present

## 2022-10-22 DIAGNOSIS — M4802 Spinal stenosis, cervical region: Secondary | ICD-10-CM

## 2022-10-22 DIAGNOSIS — Z09 Encounter for follow-up examination after completed treatment for conditions other than malignant neoplasm: Secondary | ICD-10-CM

## 2022-10-22 DIAGNOSIS — Z87892 Personal history of anaphylaxis: Secondary | ICD-10-CM | POA: Diagnosis not present

## 2022-10-22 DIAGNOSIS — L509 Urticaria, unspecified: Secondary | ICD-10-CM | POA: Diagnosis not present

## 2022-10-22 LAB — POCT URINALYSIS DIP (CLINITEK)
Bilirubin, UA: NEGATIVE
Blood, UA: NEGATIVE
Glucose, UA: 100 mg/dL — AB
Ketones, POC UA: NEGATIVE mg/dL
Leukocytes, UA: NEGATIVE
Nitrite, UA: NEGATIVE
POC PROTEIN,UA: 30 — AB
Spec Grav, UA: 1.025 (ref 1.010–1.025)
Urobilinogen, UA: 0.2 E.U./dL
pH, UA: 5.5 (ref 5.0–8.0)

## 2022-10-22 MED ORDER — HYDROXYZINE PAMOATE 25 MG PO CAPS: 25.0000 mg | ORAL_CAPSULE | Freq: Three times a day (TID) | ORAL | 0 refills | Status: DC | PRN

## 2022-10-22 MED ORDER — EPINEPHRINE 0.3 MG/0.3ML IJ SOAJ: 0.3000 mg | INTRAMUSCULAR | 2 refills | Status: DC | PRN

## 2022-10-22 NOTE — Progress Notes (Addendum)
Established Patient Office Visit  Subjective:  Patient ID: Christopher [name], male    DOB: 09/04/1955  Age: 67 y.o. MRN: [id number]  CC:  Chief Complaint  Patient presents with   Urticaria    Patient has hives all over his body, itching and burning. He is also having burning while urinating   Hospitalization Follow-up    HPI Christopher [name] is a 68 y.o. male who presents for f/u of recent hospitalization and c/o hives.  He had an anterior cervical discectomy fusion-C3-C4, C4-C5 and removal of hardware at C5-C6 on 10/13/22.  He was discharged with Medrol Dosepak, Lyrica and Norco for postop pain.  He started having generalized hives and itching, over his trunk and bilateral LE.  He has tried taking Benadryl with mild relief.  Of note, his wife reports history of allergy to cortisone and prednisone, which caused generalized swelling and throat swelling in the past, but reports that he has tolerated Medrol Dosepak (?). He has history of anaphylactic reaction in the past to a medicine while he was in the hospital, was given EpiPen and requests a refill of EpiPen.  He denies exposure to any new medicine recently.  Denies any insect bite.  He also reports dysuria, but denies any fever, chills, or hematuria.  Denies any flank or pelvic pain.  Of note, he states that he had been taking about 6 tablets of Benadryl in a day for itching.   Past Medical History:  Diagnosis Date   Anxiety    Arthritis    Asthma    BPH (benign prostatic hyperplasia)    Complication of anesthesia    pt had a hard time being able to move after spinal anesthesia , 3-4 hours   Depression    GERD (gastroesophageal reflux disease)    Gout    no meds   Headache(784.0)    otc meds prn   Heart murmur    dx as a child, no problems as an adult   History of hiatal hernia    Hyperlipidemia    IBS (irritable bowel syndrome)    PONV (postoperative nausea and vomiting)    Pre-diabetes    Borderline, diet and exercise,  no med   Sleep apnea    uses CIPAP machine at night   Wears partial dentures    bottom partial    Past Surgical History:  Procedure Laterality Date   ANTERIOR CERVICAL DECOMP/DISCECTOMY FUSION N/A 10/13/2022   Procedure: ANTERIOR CERVICAL DISECTOMY FUSION - CERVICAL THREE-CERVICAL FOUR - CERVICAL FOUR-CERVICAL FIVE REMOVAL OF HARDWARE CERVICAL FIVE-CERVICAL SIX;  Surgeon: Kary Kos, MD;  Location: [facility];  Service: Neurosurgery;  Laterality: N/A;   APPENDECTOMY     BACK SURGERY  2002   neck and back fusion   BIOPSY  12/27/2015   Procedure: BIOPSY;  Surgeon: Rogene Houston, MD;  [facility];  Service: Endoscopy;;  Fundus biopsies and duodenal biopsies   BIOPSY  02/10/2020   Procedure: BIOPSY;  Surgeon: Rogene Houston, MD;  [facility];  Service: Endoscopy;;  antral   CARDIAC CATHETERIZATION     CARDIAC CATHETERIZATION N/A 09/04/2016   Procedure: Right/Left Heart Cath and Coronary Angiography;  Surgeon: Peter M Martinique, MD;  Location: [facility];  Service: Cardiovascular;  Laterality: N/A;   CHOLECYSTECTOMY     CHONDROPLASTY  08/15/2011   Procedure: CHONDROPLASTY;  Surgeon: [name], MD;  Location: [facility];  Service: Orthopedics;  Laterality: Left;   COLONOSCOPY  06/27/2011   Procedure: COLONOSCOPY;  Surgeon: Rogene Houston, MD;  [facility];  Service: Endoscopy;  Laterality: N/A;  9:00 / Pt to be here at 9am for 10:45 procedure, benign polyps removed   COLONOSCOPY N/A 10/05/2014   Procedure: COLONOSCOPY;  Surgeon: Rogene Houston, MD;  [facility];  Service: Endoscopy;  Laterality: N/A;  930   COLONOSCOPY N/A 02/11/2018   Procedure: COLONOSCOPY;  Surgeon: Rogene Houston, MD;  [facility];  Service: Endoscopy;  Laterality: N/A;  830   ESOPHAGOGASTRODUODENOSCOPY N/A 12/27/2015   Procedure: ESOPHAGOGASTRODUODENOSCOPY (EGD);  Surgeon: Rogene Houston, MD;  [facility];  Service: Endoscopy;   Laterality: N/A;  3:00   ESOPHAGOGASTRODUODENOSCOPY (EGD) WITH PROPOFOL N/A 02/10/2020   Procedure: ESOPHAGOGASTRODUODENOSCOPY (EGD) WITH PROPOFOL;  Surgeon: Rogene Houston, MD;  [facility];  Service: Endoscopy;  Laterality: N/A;  155   HERNIA REPAIR     umbilical hernia   JOINT REPLACEMENT     knee and shoulder   KNEE ARTHROSCOPY     left knee   KNEE ARTHROSCOPY     right knee    LUMBAR LAMINECTOMY/DECOMPRESSION MICRODISCECTOMY  09/14/2012   Procedure: LUMBAR LAMINECTOMY/DECOMPRESSION MICRODISCECTOMY 1 LEVEL;  Surgeon: Floyce Stakes, MD;  Location: [facility];  Service: Neurosurgery;  Laterality: Right;  Right Lumbar three-four Diskectomy   neck fusion  2005   NECK SURGERY     PATELLA-FEMORAL ARTHROPLASTY Right 02/18/2022   Procedure: RIGHT KNEE PATELLA REPLACEMENT, CEMENTED;  Surgeon: Meredith Pel, MD;  Location: [facility];  Service: Orthopedics;  Laterality: Right;   POLYPECTOMY  02/11/2018   Procedure: POLYPECTOMY;  Surgeon: Rogene Houston, MD;  [facility];  Service: Endoscopy;;  colon   REVISION TOTAL SHOULDER TO REVERSE TOTAL SHOULDER Left 12/16/2018   Procedure: REVISION TOTAL SHOULDER TO REVERSE TOTAL SHOULDER;  Surgeon: Meredith Pel, MD;  Location: [facility];  Service: Orthopedics;  Laterality: Left;   SHOULDER ARTHROSCOPY WITH BICEPSTENOTOMY Left 09/23/2013   Procedure: SHOULDER ARTHROSCOPY WITH BICEPSTENOTOMY AND EXTENSIVE DEBRIDEMENT;  Surgeon: Carole Civil, MD;  [facility]: [facility];  Service: Orthopedics;  Laterality: Left;   SHOULDER ARTHROSCOPY WITH ROTATOR CUFF REPAIR Left 05/05/2014   Procedure: SHOULDER ARTHROSCOPY LIMITED DEBRIDEMENT;  Surgeon: Carole Civil, MD;  [facility];  Service: Orthopedics;  Laterality: Left;   SHOULDER OPEN ROTATOR CUFF REPAIR Left 05/05/2014   Procedure: ROTATOR CUFF REPAIR SHOULDER OPEN;  Surgeon: Carole Civil, MD;  [facility];  Service: Orthopedics;  Laterality: Left;    SHOULDER OPEN ROTATOR CUFF REPAIR Left 12/16/2018   Procedure: LEFT SHOULDER POSSIBLE SUBSCAPULARIS REPAIR VS. REVISION TO REVERSE TOTAL SHOULDER REPLACEMENT;  Surgeon: Meredith Pel, MD;  Location: [facility];  Service: Orthopedics;  Laterality: Left;   SHOULDER SURGERY Right    Open Mumford procedure   SPINAL FUSION     x2, 2003 and 2007   TOTAL KNEE ARTHROPLASTY Left 06/16/2017   Procedure: LEFT TOTAL KNEE ARTHROPLASTY;  Surgeon: Carole Civil, MD;  [facility];  Service: Orthopedics;  Laterality: Left;   TOTAL KNEE ARTHROPLASTY Right 05/14/2021   Procedure: TOTAL KNEE ARTHROPLASTY;  Surgeon: Carole Civil, MD;  [facility];  Service: Orthopedics;  Laterality: Right;   TOTAL SHOULDER ARTHROPLASTY Left 08/19/2018   Procedure: left shoulder replacement;  Surgeon: Meredith Pel, MD;  Location: [facility];  Service: Orthopedics;  Laterality: Left;    Family History  Problem Relation  Age of Onset   Alzheimer's disease Father    Diabetes Father    Diabetes Other    Lung disease Other    Arthritis Other    Anesthesia problems Neg Hx    Hypotension Neg Hx    Malignant hyperthermia Neg Hx    Pseudochol deficiency Neg Hx    Sleep apnea Neg Hx     Social History   Socioeconomic History   Marital status: Married    Spouse name: Not on file   Number of children: Not on file   Years of education: Not on file   Highest education level: Not on file  Occupational History   Occupation: Buyer, retail    Employer: DISABLED  Tobacco Use   Smoking status: Former    Packs/day: 1.00    Years: 44.00    Total pack years: 44.00    Types: Cigarettes    Quit date: 08/25/2022    Years since quitting: 0.1   Smokeless tobacco: Never   Tobacco comments:    smokes a pack a day. since age 36  Vaping Use   Vaping Use: Never used  Substance and Sexual Activity   Alcohol use: No    Alcohol/week: 0.0 standard drinks of alcohol   Drug use: No   Sexual activity: Not  Currently  Other Topics Concern   Not on file  Social History Narrative   Lives at home with wife   Right handed   Caffeine: 2 cups of coffee, 2 cans of soda, 4-5 cups of tea daily.   Social Determinants of Health   Financial Resource Strain: Low Risk  (09/17/2022)   Overall Financial Resource Strain (CARDIA)    Difficulty of Paying Living Expenses: Not hard at all  Food Insecurity: No Food Insecurity (09/17/2022)   Hunger Vital Sign    Worried About Running Out of Food in the Last Year: Never true    Ran Out of Food in the Last Year: Never true  Transportation Needs: No Transportation Needs (09/17/2022)   PRAPARE - Hydrologist (Medical): No    Lack of Transportation (Non-Medical): No  Physical Activity: Inactive (09/17/2022)   Exercise Vital Sign    Days of Exercise per Week: 0 days    Minutes of Exercise per Session: 0 min  Stress: Stress Concern Present (09/17/2022)   [place]    Feeling of Stress : Very much  Social Connections: Socially Isolated (09/17/2022)   Social Connection and Isolation Panel [NHANES]    Frequency of Communication with Friends and Family: Once a week    Frequency of Social Gatherings with Friends and Family: Once a week    Attends Religious Services: Never    Marine scientist or Organizations: No    Attends Archivist Meetings: Never    Marital Status: Married  Human resources officer Violence: Not At Risk (09/17/2022)   Humiliation, Afraid, Rape, and Kick questionnaire    Fear of Current or Ex-Partner: No    Emotionally Abused: No    Physically Abused: No    Sexually Abused: No    Outpatient Medications Prior to Visit  Medication Sig Dispense Refill   acetaminophen (TYLENOL) 500 MG tablet Take 1,000 mg by mouth every 6 (six) hours as needed for moderate pain.     cyclobenzaprine (FLEXERIL) 10 MG tablet Take 1 tablet (10 mg total) by mouth 3  (three) times daily as needed for  muscle spasms. Do not take with Robaxin 30 tablet 1   furosemide (LASIX) 20 MG tablet Take 1 tablet (20 mg total) by mouth daily as needed (shortness of breath). 30 tablet 0   gabapentin (NEURONTIN) 100 MG capsule Take 1 capsule (100 mg total) by mouth 3 (three) times daily as needed. 60 capsule 2   HYDROcodone-acetaminophen (NORCO/VICODIN) 5-325 MG tablet Take 1 tablet by mouth every 4 (four) hours as needed for moderate pain. 20 tablet 0   Menthol, Topical Analgesic, (ICY HOT EX) Apply 1 application topically daily as needed (pain).     nitroGLYCERIN (NITROSTAT) 0.4 MG SL tablet PLACE (1) TABLET UNDER TONGUE EVERY 5 MINUTES UP TO (3) DOSES. IF NO RELIEF CALL 911. 25 tablet 0   pantoprazole (PROTONIX) 40 MG tablet TAKE (1) TABLET TWICE A DAY BEFORE MEALS. (Patient taking differently: Take 40 mg by mouth daily.) 60 tablet 0   pregabalin (LYRICA) 50 MG capsule Take 1 capsule (50 mg total) by mouth 2 (two) times daily. (Patient taking differently: Take 50 mg by mouth 2 (two) times daily as needed (pain).) 60 capsule 1   PROAIR HFA 108 (90 Base) MCG/ACT inhaler Inhale 2 puffs into the lungs every 6 (six) hours as needed for wheezing or shortness of breath. 6.7 g 2   rosuvastatin (CRESTOR) 20 MG tablet Take 1 tablet (20 mg total) by mouth daily. 90 tablet 3   sertraline (ZOLOFT) 50 MG tablet Take 50 mg by mouth in the morning.     Tiotropium Bromide-Olodaterol (STIOLTO RESPIMAT) 2.5-2.5 MCG/ACT AERS Inhale 2 puffs into the lungs daily. 1 each 5   EPINEPHrine 0.3 mg/0.3 mL IJ SOAJ injection Inject 0.3 mg into the muscle as needed for anaphylaxis.     methylPREDNISolone (MEDROL DOSEPAK) 4 MG TBPK tablet Take as directed on the dosepack 21 tablet 0   No facility-administered medications prior to visit.    Allergies  Allergen Reactions   Celebrex [Celecoxib] Itching, Swelling and Other (See Comments)    All over   Cortisone Swelling    SWELLING REACTION UNSPECIFIED     Doxycycline Swelling and Other (See Comments)    Made tongue turn black    Prednisone Swelling    SWELLING REACTION UNSPECIFIED    Relafen [Nabumetone] Swelling    SWELLING REACTION UNSPECIFIED    Codeine Nausea And Vomiting and Other (See Comments)    Extreme stomach pain. This includes anything with the derivative of codeine in it. (Does tolerate hydrocodone)    Aspirin Itching    Stomach cramps   Oxycodone-Acetaminophen Itching and Other (See Comments)    Can tolerate with benadryl     ROS Review of Systems  Constitutional:  Negative for chills and fever.  HENT:  Negative for congestion and sore throat.   Eyes:  Negative for pain and discharge.  Respiratory:  Negative for cough and shortness of breath.   Cardiovascular:  Negative for chest pain and palpitations.  Gastrointestinal:  Negative for diarrhea, nausea and vomiting.  Endocrine: Negative for polydipsia and polyuria.  Genitourinary:  Positive for dysuria. Negative for hematuria.  Musculoskeletal:  Positive for back pain and neck pain. Negative for neck stiffness.  Skin:  Positive for rash.  Neurological:  Negative for dizziness, weakness, numbness and headaches.  Psychiatric/Behavioral:  Negative for agitation and behavioral problems.       Objective:    Physical Exam Vitals reviewed.  Constitutional:      General: He is not in acute distress.  Appearance: He is obese. He is not diaphoretic.  HENT:     Head: Normocephalic and atraumatic.     Nose: Nose normal.     Mouth/Throat:     Mouth: Mucous membranes are moist.  Eyes:     General: No scleral icterus.    Extraocular Movements: Extraocular movements intact.  Cardiovascular:     Rate and Rhythm: Normal rate and regular rhythm.     Heart sounds: Normal heart sounds. No murmur heard. Pulmonary:     Breath sounds: Normal breath sounds. No wheezing or rales.  Musculoskeletal:     Cervical back: Neck supple. No tenderness.     Right lower leg: No  edema.     Left lower leg: No edema.  Skin:    General: Skin is warm.     Findings: Rash (Multiple spots of erythematous papules over chest wall, abdominal wall and b/l LE) present.  Neurological:     General: No focal deficit present.     Mental Status: He is alert and oriented to person, place, and time.     Sensory: No sensory deficit.     Motor: No weakness.  Psychiatric:        Mood and Affect: Mood normal.        Behavior: Behavior normal.     BP 131/80 (BP Location: Left Arm, Patient Position: Sitting, Cuff Size: Large)   Pulse (!) 121   Ht 5' 6"$  (1.676 m)   Wt 234 lb 12.8 oz (106.5 kg)   SpO2 96%   BMI 37.90 kg/m  Wt Readings from Last 3 Encounters:  10/22/22 234 lb 12.8 oz (106.5 kg)  10/13/22 241 lb (109.3 kg)  10/10/22 237 lb (107.5 kg)    Lab Results  Component Value Date   TSH 1.530 06/06/2022   Lab Results  Component Value Date   WBC 8.3 10/10/2022   HGB 13.5 10/10/2022   HCT 38.4 (L) 10/10/2022   MCV 82.4 10/10/2022   PLT 231 10/10/2022   Lab Results  Component Value Date   NA 133 (L) 10/10/2022   K 4.0 10/10/2022   CO2 23 10/10/2022   GLUCOSE 211 (H) 10/10/2022   BUN 14 10/10/2022   CREATININE 0.87 10/10/2022   BILITOT 0.3 10/02/2022   ALKPHOS 86 10/02/2022   AST 17 10/02/2022   ALT 29 10/02/2022   PROT 6.0 10/02/2022   ALBUMIN 4.2 10/02/2022   CALCIUM 9.0 10/10/2022   ANIONGAP 10 10/10/2022   EGFR 91 10/02/2022   Lab Results  Component Value Date   CHOL 144 06/06/2022   Lab Results  Component Value Date   HDL 38 (L) 06/06/2022   Lab Results  Component Value Date   LDLCALC 59 06/06/2022   Lab Results  Component Value Date   TRIG 298 (H) 06/06/2022   Lab Results  Component Value Date   CHOLHDL 3.8 06/06/2022   Lab Results  Component Value Date   HGBA1C 6.1 (H) 06/06/2022      Assessment & Plan:   Spinal stenosis in cervical area Hospital discharge follow-up S/p anterior cervical discectomy fusion-C3-C4, C4-C5 and  removal of hardware at C5-C6 on 10/13/22. Medications reconciled and reviewed Has Lyrica and Norco Recently had Medrol dosepak, although reports allergy to Prednisone  Urticaria Could be due to recent medication exposure at hospital He reports allergy to prednisone, which is less likely - but he recently had Medrol dosepak Has tried Benadryl with some relief Switched to hydroxyzine as needed for itching  Would avoid further steroid exposure for now  History of anaphylaxis Refilled EpiPen Currently does not have dyspnea or oropharyngeal swelling  Dysuria UA reviewed, negative for LE or nitrite Less likely to be UTI Could be due to overdosing of Benadryl and dehydration Advised to maintain adequate hydration for now   Meds ordered this encounter  Medications   hydrOXYzine (VISTARIL) 25 MG capsule    Sig: Take 1 capsule (25 mg total) by mouth every 8 (eight) hours as needed.    Dispense:  30 capsule    Refill:  0   EPINEPHrine 0.3 mg/0.3 mL IJ SOAJ injection    Sig: Inject 0.3 mg into the muscle as needed for anaphylaxis.    Dispense:  1 each    Refill:  2    Follow-up: Return if symptoms worsen or fail to improve.    Lindell Spar, MD

## 2022-10-22 NOTE — Patient Instructions (Signed)
Please take Hydroxyzine as needed for itching and hives.  Please maintain at least 64 ounces of fluid in a day.

## 2022-10-24 ENCOUNTER — Encounter (HOSPITAL_COMMUNITY): Payer: Self-pay

## 2022-10-24 ENCOUNTER — Telehealth: Payer: Self-pay | Admitting: Internal Medicine

## 2022-10-24 ENCOUNTER — Other Ambulatory Visit: Payer: Self-pay

## 2022-10-24 ENCOUNTER — Emergency Department (HOSPITAL_COMMUNITY): Payer: PPO

## 2022-10-24 ENCOUNTER — Inpatient Hospital Stay (HOSPITAL_COMMUNITY)
Admission: EM | Admit: 2022-10-24 | Discharge: 2022-10-26 | DRG: 916 | Disposition: A | Discharge status: Home / Self Care | Payer: PPO | Attending: Family Medicine | Admitting: Family Medicine

## 2022-10-24 DIAGNOSIS — Z981 Arthrodesis status: Secondary | ICD-10-CM

## 2022-10-24 DIAGNOSIS — Z96612 Presence of left artificial shoulder joint: Secondary | ICD-10-CM | POA: Diagnosis present

## 2022-10-24 DIAGNOSIS — T380X5A Adverse effect of glucocorticoids and synthetic analogues, initial encounter: Secondary | ICD-10-CM | POA: Diagnosis present

## 2022-10-24 DIAGNOSIS — Z833 Family history of diabetes mellitus: Secondary | ICD-10-CM

## 2022-10-24 DIAGNOSIS — Z888 Allergy status to other drugs, medicaments and biological substances status: Secondary | ICD-10-CM

## 2022-10-24 DIAGNOSIS — R21 Rash and other nonspecific skin eruption: Secondary | ICD-10-CM | POA: Diagnosis present

## 2022-10-24 DIAGNOSIS — E669 Obesity, unspecified: Secondary | ICD-10-CM | POA: Diagnosis present

## 2022-10-24 DIAGNOSIS — I10 Essential (primary) hypertension: Secondary | ICD-10-CM | POA: Diagnosis present

## 2022-10-24 DIAGNOSIS — Z79899 Other long term (current) drug therapy: Secondary | ICD-10-CM

## 2022-10-24 DIAGNOSIS — G4733 Obstructive sleep apnea (adult) (pediatric): Secondary | ICD-10-CM | POA: Diagnosis present

## 2022-10-24 DIAGNOSIS — F419 Anxiety disorder, unspecified: Secondary | ICD-10-CM | POA: Diagnosis present

## 2022-10-24 DIAGNOSIS — Z820 Family history of epilepsy and other diseases of the nervous system: Secondary | ICD-10-CM

## 2022-10-24 DIAGNOSIS — Z87891 Personal history of nicotine dependence: Secondary | ICD-10-CM

## 2022-10-24 DIAGNOSIS — Z881 Allergy status to other antibiotic agents status: Secondary | ICD-10-CM

## 2022-10-24 DIAGNOSIS — R1310 Dysphagia, unspecified: Secondary | ICD-10-CM | POA: Diagnosis present

## 2022-10-24 DIAGNOSIS — Z885 Allergy status to narcotic agent status: Secondary | ICD-10-CM

## 2022-10-24 DIAGNOSIS — Z6837 Body mass index (BMI) 37.0-37.9, adult: Secondary | ICD-10-CM | POA: Diagnosis not present

## 2022-10-24 DIAGNOSIS — R7303 Prediabetes: Secondary | ICD-10-CM | POA: Diagnosis present

## 2022-10-24 DIAGNOSIS — K219 Gastro-esophageal reflux disease without esophagitis: Secondary | ICD-10-CM | POA: Diagnosis present

## 2022-10-24 DIAGNOSIS — E785 Hyperlipidemia, unspecified: Secondary | ICD-10-CM | POA: Diagnosis present

## 2022-10-24 DIAGNOSIS — F32A Depression, unspecified: Secondary | ICD-10-CM | POA: Diagnosis present

## 2022-10-24 DIAGNOSIS — M109 Gout, unspecified: Secondary | ICD-10-CM | POA: Diagnosis present

## 2022-10-24 DIAGNOSIS — Z886 Allergy status to analgesic agent status: Secondary | ICD-10-CM

## 2022-10-24 DIAGNOSIS — T783XXA Angioneurotic edema, initial encounter: Secondary | ICD-10-CM | POA: Diagnosis present

## 2022-10-24 DIAGNOSIS — J4489 Other specified chronic obstructive pulmonary disease: Secondary | ICD-10-CM | POA: Diagnosis present

## 2022-10-24 DIAGNOSIS — T7840XA Allergy, unspecified, initial encounter: Principal | ICD-10-CM

## 2022-10-24 DIAGNOSIS — N400 Enlarged prostate without lower urinary tract symptoms: Secondary | ICD-10-CM | POA: Diagnosis present

## 2022-10-24 DIAGNOSIS — Z96653 Presence of artificial knee joint, bilateral: Secondary | ICD-10-CM | POA: Diagnosis present

## 2022-10-24 DIAGNOSIS — L509 Urticaria, unspecified: Secondary | ICD-10-CM | POA: Insufficient documentation

## 2022-10-24 DIAGNOSIS — J449 Chronic obstructive pulmonary disease, unspecified: Secondary | ICD-10-CM | POA: Diagnosis present

## 2022-10-24 LAB — CBC WITH DIFFERENTIAL/PLATELET
Abs Immature Granulocytes: 0.1 10*3/uL — ABNORMAL HIGH (ref 0.00–0.07)
Basophils Absolute: 0 10*3/uL (ref 0.0–0.1)
Basophils Relative: 0 %
Eosinophils Absolute: 0 10*3/uL (ref 0.0–0.5)
Eosinophils Relative: 0 %
HCT: 39.7 % (ref 39.0–52.0)
Hemoglobin: 13.6 g/dL (ref 13.0–17.0)
Immature Granulocytes: 1 %
Lymphocytes Relative: 22 %
Lymphs Abs: 2.6 10*3/uL (ref 0.7–4.0)
MCH: 28.1 pg (ref 26.0–34.0)
MCHC: 34.3 g/dL (ref 30.0–36.0)
MCV: 82 fL (ref 80.0–100.0)
Monocytes Absolute: 0.5 10*3/uL (ref 0.1–1.0)
Monocytes Relative: 4 %
Neutro Abs: 8.7 10*3/uL — ABNORMAL HIGH (ref 1.7–7.7)
Neutrophils Relative %: 73 %
Platelets: 250 10*3/uL (ref 150–400)
RBC: 4.84 MIL/uL (ref 4.22–5.81)
RDW: 14.6 % (ref 11.5–15.5)
WBC: 11.8 10*3/uL — ABNORMAL HIGH (ref 4.0–10.5)
nRBC: 0 % (ref 0.0–0.2)

## 2022-10-24 LAB — BASIC METABOLIC PANEL
Anion gap: 11 (ref 5–15)
BUN: 19 mg/dL (ref 8–23)
CO2: 21 mmol/L — ABNORMAL LOW (ref 22–32)
Calcium: 8.6 mg/dL — ABNORMAL LOW (ref 8.9–10.3)
Chloride: 95 mmol/L — ABNORMAL LOW (ref 98–111)
Creatinine, Ser: 0.99 mg/dL (ref 0.61–1.24)
GFR, Estimated: >60 mL/min (ref >60)
Glucose, Bld: 274 mg/dL — ABNORMAL HIGH (ref 70–99)
Potassium: 4.2 mmol/L (ref 3.5–5.1)
Sodium: 127 mmol/L — ABNORMAL LOW (ref 135–145)

## 2022-10-24 MED ORDER — FAMOTIDINE IN NACL 20-0.9 MG/50ML-% IV SOLN
20.0000 mg | Freq: Two times a day (BID) | INTRAVENOUS | Status: DC
  Administered 2022-10-24 – 2022-10-26 (×4): 20 mg via INTRAVENOUS
  Filled 2022-10-24 (×4): qty 50

## 2022-10-24 MED ORDER — PANTOPRAZOLE SODIUM 40 MG PO TBEC
40.0000 mg | DELAYED_RELEASE_TABLET | Freq: Every day | ORAL | Status: DC
  Administered 2022-10-24 – 2022-10-26 (×3): 40 mg via ORAL
  Filled 2022-10-24 (×4): qty 1

## 2022-10-24 MED ORDER — EPINEPHRINE 0.3 MG/0.3ML IJ SOAJ
0.3000 mg | INTRAMUSCULAR | Status: DC | PRN
  Administered 2022-10-25: 0.3 mg via INTRAMUSCULAR
  Filled 2022-10-24: qty 0.3

## 2022-10-24 MED ORDER — ALBUTEROL SULFATE HFA 108 (90 BASE) MCG/ACT IN AERS: 2.0000 | INHALATION_SPRAY | Freq: Four times a day (QID) | RESPIRATORY_TRACT | Status: DC | PRN

## 2022-10-24 MED ORDER — EPINEPHRINE 0.3 MG/0.3ML IJ SOAJ
0.3000 mg | Freq: Once | INTRAMUSCULAR | Status: AC
  Administered 2022-10-24: 0.3 mg via INTRAMUSCULAR
  Filled 2022-10-24: qty 0.3

## 2022-10-24 MED ORDER — ENOXAPARIN SODIUM 40 MG/0.4ML IJ SOSY
40.0000 mg | PREFILLED_SYRINGE | INTRAMUSCULAR | Status: DC
  Administered 2022-10-24 – 2022-10-25 (×2): 40 mg via SUBCUTANEOUS
  Filled 2022-10-24 (×2): qty 0.4

## 2022-10-24 MED ORDER — CYCLOBENZAPRINE HCL 10 MG PO TABS
5.0000 mg | ORAL_TABLET | Freq: Three times a day (TID) | ORAL | Status: DC | PRN
  Administered 2022-10-25: 5 mg via ORAL
  Filled 2022-10-24: qty 1

## 2022-10-24 MED ORDER — ROSUVASTATIN CALCIUM 20 MG PO TABS
20.0000 mg | ORAL_TABLET | Freq: Every day | ORAL | Status: DC
  Administered 2022-10-24 – 2022-10-26 (×3): 20 mg via ORAL
  Filled 2022-10-24 (×3): qty 1

## 2022-10-24 MED ORDER — HYDROCODONE-ACETAMINOPHEN 5-325 MG PO TABS
1.0000 | ORAL_TABLET | ORAL | Status: DC | PRN
  Administered 2022-10-24 – 2022-10-25 (×4): 1 via ORAL
  Filled 2022-10-24 (×4): qty 1

## 2022-10-24 MED ORDER — EPINEPHRINE 0.3 MG/0.3ML IJ SOAJ: 0.3000 mg | INTRAMUSCULAR | Status: DC | PRN

## 2022-10-24 MED ORDER — ONDANSETRON HCL 4 MG/2ML IJ SOLN: 4.0000 mg | Freq: Four times a day (QID) | INTRAMUSCULAR | Status: DC | PRN

## 2022-10-24 MED ORDER — SERTRALINE HCL 50 MG PO TABS
50.0000 mg | ORAL_TABLET | Freq: Every morning | ORAL | Status: DC
  Administered 2022-10-25 – 2022-10-26 (×2): 50 mg via ORAL
  Filled 2022-10-24 (×2): qty 1

## 2022-10-24 MED ORDER — IOHEXOL 300 MG/ML  SOLN
75.0000 mL | Freq: Once | INTRAMUSCULAR | Status: AC | PRN
  Administered 2022-10-24: 75 mL via INTRAVENOUS

## 2022-10-24 MED ORDER — ONDANSETRON HCL 4 MG PO TABS: 4.0000 mg | ORAL_TABLET | Freq: Four times a day (QID) | ORAL | Status: DC | PRN

## 2022-10-24 MED ORDER — HYDROXYZINE HCL 50 MG/ML IM SOLN
25.0000 mg | INTRAMUSCULAR | Status: DC
  Administered 2022-10-24 – 2022-10-25 (×5): 25 mg via INTRAMUSCULAR
  Filled 2022-10-24 (×6): qty 1

## 2022-10-24 MED ORDER — DIPHENHYDRAMINE HCL 50 MG/ML IJ SOLN
25.0000 mg | Freq: Once | INTRAMUSCULAR | Status: AC
  Administered 2022-10-24: 25 mg via INTRAVENOUS
  Filled 2022-10-24: qty 1

## 2022-10-24 MED ORDER — FAMOTIDINE IN NACL 20-0.9 MG/50ML-% IV SOLN
20.0000 mg | Freq: Once | INTRAVENOUS | Status: AC
  Administered 2022-10-24: 20 mg via INTRAVENOUS
  Filled 2022-10-24: qty 50

## 2022-10-24 NOTE — Telephone Encounter (Signed)
Spouse called in on patient behalf.   The hydrOXYzine (VISTARIL) 25 MG capsule  prescribed from last visit 2/21 w. Posey Pronto is not working / helping patient at all.   Patient wants to see if he can get something else sent in to help.  Wants a call back.

## 2022-10-24 NOTE — Telephone Encounter (Signed)
Spoke with the pt's wife.  She states that urticaria continues to spread and is now present on his neck.  He has endorsed feeling as though his "throat is slowly closing".  I recommended prompt presentation to the emergency department for evaluation.  His symptoms have not improved with multiple antihistamines.  He has multiple systemic steroid allergies (prednisone and cortisone).  Recently completed Medrol Dosepak, which is thought to have potentially triggered his symptoms.

## 2022-10-24 NOTE — H&P (Signed)
History and Physical    Patient: Christopher [name] [id number] DOB: November 15, 1955 DOA: 10/24/2022 DOS: the patient was seen and examined on 10/24/2022 PCP: Johnette Abraham, MD  Patient coming from: Home  Chief Complaint:  Chief Complaint  Patient presents with   Allergic Reaction   HPI: Christopher [name] is a 67 y.o. male with medical history significant of hyperlipidemia, prediabetes, hypertension, BPH, OSA.  Patient recently had neck surgery.  He was discharged on a Medrol Dosepak due to concerns about neck swelling.  He does have a history of allergy to prednisone and cortisone where he develops swelling.  The patient went through the Medrol Dosepak and began to have hives at the completion of the Medrol Dosepak.  He does feel like it is a little difficult to swallow, but does not have any problems breathing.  He did see his PCP who started him on Vistaril to help with the itching.  The itching is improved however he still has the hives.  CT scan here shows some angioedema of the glottis.  He received 1 dose of epinephrine which was helpful in reducing both the throat irritation/difficulty swallowing as well as the hives.  Within a couple of hours, these symptoms returned.  The EDP discussed the patient with critical care, who thought the patient could go to stepdown at [facility] and receive epinephrine as needed.  Review of Systems: As mentioned in the history of present illness. All other systems reviewed and are negative. Past Medical History:  Diagnosis Date   Anxiety    Arthritis    Asthma    BPH (benign prostatic hyperplasia)    Complication of anesthesia    pt had a hard time being able to move after spinal anesthesia , 3-4 hours   Depression    GERD (gastroesophageal reflux disease)    Gout    no meds   Headache(784.0)    otc meds prn   Heart murmur    dx as a child, no problems as an adult   History of hiatal hernia    Hyperlipidemia    IBS (irritable bowel syndrome)     PONV (postoperative nausea and vomiting)    Pre-diabetes    Borderline, diet and exercise, no med   Sleep apnea    uses CIPAP machine at night   Wears partial dentures    bottom partial   Past Surgical History:  Procedure Laterality Date   ANTERIOR CERVICAL DECOMP/DISCECTOMY FUSION N/A 10/13/2022   Procedure: ANTERIOR CERVICAL DISECTOMY FUSION - CERVICAL THREE-CERVICAL FOUR - CERVICAL FOUR-CERVICAL FIVE REMOVAL OF HARDWARE CERVICAL FIVE-CERVICAL SIX;  Surgeon: Kary Kos, MD;  Location: [place];  Service: Neurosurgery;  Laterality: N/A;   APPENDECTOMY     BACK SURGERY  2002   neck and back fusion   BIOPSY  12/27/2015   Procedure: BIOPSY;  Surgeon: Rogene Houston, MD;  [facility];  Service: Endoscopy;;  Fundus biopsies and duodenal biopsies   BIOPSY  02/10/2020   Procedure: BIOPSY;  Surgeon: Rogene Houston, MD;  [facility];  Service: Endoscopy;;  antral   CARDIAC CATHETERIZATION     CARDIAC CATHETERIZATION N/A 09/04/2016   Procedure: Right/Left Heart Cath and Coronary Angiography;  Surgeon: Peter M Martinique, MD;  Location: [place] [facility];  Service: Cardiovascular;  Laterality: N/A;   CHOLECYSTECTOMY     CHONDROPLASTY  08/15/2011   Procedure: CHONDROPLASTY;  Surgeon: [name], MD;  [facility];  Service: Orthopedics;  Laterality:  Left;   COLONOSCOPY  06/27/2011   Procedure: COLONOSCOPY;  Surgeon: Rogene Houston, MD;  [facility];  Service: Endoscopy;  Laterality: N/A;  9:00 / Pt to be here at 9am for 10:45 procedure, benign polyps removed   COLONOSCOPY N/A 10/05/2014   Procedure: COLONOSCOPY;  Surgeon: Rogene Houston, MD;  [facility];  Service: Endoscopy;  Laterality: N/A;  930   COLONOSCOPY N/A 02/11/2018   Procedure: COLONOSCOPY;  Surgeon: Rogene Houston, MD;  [facility];  Service: Endoscopy;  Laterality: N/A;  830   ESOPHAGOGASTRODUODENOSCOPY N/A 12/27/2015   Procedure: ESOPHAGOGASTRODUODENOSCOPY  (EGD);  Surgeon: Rogene Houston, MD;  [facility];  Service: Endoscopy;  Laterality: N/A;  3:00   ESOPHAGOGASTRODUODENOSCOPY (EGD) WITH PROPOFOL N/A 02/10/2020   Procedure: ESOPHAGOGASTRODUODENOSCOPY (EGD) WITH PROPOFOL;  Surgeon: Rogene Houston, MD;  [facility];  Service: Endoscopy;  Laterality: N/A;  155   HERNIA REPAIR     umbilical hernia   JOINT REPLACEMENT     knee and shoulder   KNEE ARTHROSCOPY     left knee   KNEE ARTHROSCOPY     right knee    LUMBAR LAMINECTOMY/DECOMPRESSION MICRODISCECTOMY  09/14/2012   Procedure: LUMBAR LAMINECTOMY/DECOMPRESSION MICRODISCECTOMY 1 LEVEL;  Surgeon: Floyce Stakes, MD;  Location: [place] [facility];  Service: Neurosurgery;  Laterality: Right;  Right Lumbar three-four Diskectomy   neck fusion  2005   NECK SURGERY     PATELLA-FEMORAL ARTHROPLASTY Right 02/18/2022   Procedure: RIGHT KNEE PATELLA REPLACEMENT, CEMENTED;  Surgeon: Meredith Pel, MD;  Location: [place];  Service: Orthopedics;  Laterality: Right;   POLYPECTOMY  02/11/2018   Procedure: POLYPECTOMY;  Surgeon: Rogene Houston, MD;  [facility];  Service: Endoscopy;;  colon   REVISION TOTAL SHOULDER TO REVERSE TOTAL SHOULDER Left 12/16/2018   Procedure: REVISION TOTAL SHOULDER TO REVERSE TOTAL SHOULDER;  Surgeon: Meredith Pel, MD;  Location: [place];  Service: Orthopedics;  Laterality: Left;   SHOULDER ARTHROSCOPY WITH BICEPSTENOTOMY Left 09/23/2013   Procedure: SHOULDER ARTHROSCOPY WITH BICEPSTENOTOMY AND EXTENSIVE DEBRIDEMENT;  Surgeon: Carole Civil, MD;  [facility];  Service: Orthopedics;  Laterality: Left;   SHOULDER ARTHROSCOPY WITH ROTATOR CUFF REPAIR Left 05/05/2014   Procedure: SHOULDER ARTHROSCOPY LIMITED DEBRIDEMENT;  Surgeon: Carole Civil, MD;  [facility];  Service: Orthopedics;  Laterality: Left;   SHOULDER OPEN ROTATOR CUFF REPAIR Left 05/05/2014   Procedure: ROTATOR CUFF REPAIR SHOULDER OPEN;  Surgeon: Carole Civil, MD;  [facility];  Service: Orthopedics;  Laterality: Left;   SHOULDER OPEN ROTATOR CUFF REPAIR Left 12/16/2018   Procedure: LEFT SHOULDER POSSIBLE SUBSCAPULARIS REPAIR VS. REVISION TO REVERSE TOTAL SHOULDER REPLACEMENT;  Surgeon: Meredith Pel, MD;  Location: [place];  Service: Orthopedics;  Laterality: Left;   SHOULDER SURGERY Right    Open Mumford procedure   SPINAL FUSION     x2, 2003 and 2007   TOTAL KNEE ARTHROPLASTY Left 06/16/2017   Procedure: LEFT TOTAL KNEE ARTHROPLASTY;  Surgeon: Carole Civil, MD;  [facility];  Service: Orthopedics;  Laterality: Left;   TOTAL KNEE ARTHROPLASTY Right 05/14/2021   Procedure: TOTAL KNEE ARTHROPLASTY;  Surgeon: Carole Civil, MD;  [facility];  Service: Orthopedics;  Laterality: Right;   TOTAL SHOULDER ARTHROPLASTY Left 08/19/2018   Procedure: left shoulder replacement;  Surgeon: Meredith Pel, MD;  Location: [place];  Service: Orthopedics;  Laterality: Left;   Social  History:  reports that he quit smoking about 1 months ago. His smoking use included cigarettes. He has a 44.00 pack-year smoking history. He has never used smokeless tobacco. He reports that he does not drink alcohol and does not use drugs.  Allergies  Allergen Reactions   Celebrex [Celecoxib] Itching, Swelling and Other (See Comments)    All over   Cortisone Swelling    SWELLING REACTION UNSPECIFIED    Doxycycline Swelling and Other (See Comments)    Made tongue turn black    Prednisone Swelling    SWELLING REACTION UNSPECIFIED    Relafen [Nabumetone] Swelling    SWELLING REACTION UNSPECIFIED    Codeine Nausea And Vomiting and Other (See Comments)    Extreme stomach pain. This includes anything with the derivative of codeine in it. (Does tolerate hydrocodone)    Aspirin Itching    Stomach cramps   Oxycodone-Acetaminophen Itching and Other (See Comments)    Can tolerate with benadryl     Family History  Problem Relation Age of  Onset   Alzheimer's disease Father    Diabetes Father    Diabetes Other    Lung disease Other    Arthritis Other    Anesthesia problems Neg Hx    Hypotension Neg Hx    Malignant hyperthermia Neg Hx    Pseudochol deficiency Neg Hx    Sleep apnea Neg Hx     Prior to Admission medications   Medication Sig Start Date End Date Taking? Authorizing Provider  acetaminophen (TYLENOL) 500 MG tablet Take 1,000 mg by mouth every 6 (six) hours as needed for moderate pain.   Yes [provider]  cyclobenzaprine (FLEXERIL) 5 MG tablet Take 5 mg by mouth 3 (three) times daily as needed for muscle spasms. 10/14/22  Yes [provider]  HYDROcodone-acetaminophen (NORCO/VICODIN) 5-325 MG tablet Take 1 tablet by mouth every 4 (four) hours as needed for moderate pain. 10/14/22 10/14/23 Yes Meyran, Ocie Cornfield, NP  hydrOXYzine (VISTARIL) 25 MG capsule Take 1 capsule (25 mg total) by mouth every 8 (eight) hours as needed. 10/22/22  Yes Lindell Spar, MD  Menthol, Topical Analgesic, (ICY HOT EX) Apply 1 application topically daily as needed (pain).   Yes [provider]  pantoprazole (PROTONIX) 40 MG tablet TAKE (1) TABLET TWICE A DAY BEFORE MEALS. Patient taking differently: Take 40 mg by mouth daily. 06/04/20  Yes Ezzard Standing PA-C  PROAIR HFA 108 [phone] Base) MCG/ACT inhaler Inhale 2 puffs into the lungs every 6 (six) hours as needed for wheezing or shortness of breath. 10/01/22  Yes Johnette Abraham, MD  rosuvastatin (CRESTOR) 20 MG tablet Take 1 tablet (20 mg total) by mouth daily. 07/07/22  Yes Johnette Abraham, MD  sertraline (ZOLOFT) 50 MG tablet Take 50 mg by mouth in the morning. 12/24/10  Yes [provider]  cyclobenzaprine (FLEXERIL) 10 MG tablet Take 1 tablet (10 mg total) by mouth 3 (three) times daily as needed for muscle spasms. Do not take with Robaxin Patient not taking: Reported on 10/24/2022 10/14/22   Eleonore Chiquito, NP  EPINEPHrine 0.3 mg/0.3 mL  IJ SOAJ injection Inject 0.3 mg into the muscle as needed for anaphylaxis. 10/22/22   Lindell Spar, MD  furosemide (LASIX) 20 MG tablet Take 1 tablet (20 mg total) by mouth daily as needed (shortness of breath). Patient not taking: Reported on 10/24/2022 09/26/22   Mallipeddi, Vishnu P, MD  gabapentin (NEURONTIN) 100 MG capsule Take 1 capsule (100  mg total) by mouth 3 (three) times daily as needed. Patient not taking: Reported on 10/24/2022 05/15/22   Magnant, Gerrianne Scale, PA-C  nitroGLYCERIN (NITROSTAT) 0.4 MG SL tablet PLACE (1) TABLET UNDER TONGUE EVERY 5 MINUTES UP TO (3) DOSES. IF NO RELIEF CALL 911. Patient taking differently: Place 0.4 mg under the tongue every 5 (five) minutes as needed for chest pain. 09/02/22   Arnoldo Lenis, MD  pregabalin (LYRICA) 50 MG capsule Take 1 capsule (50 mg total) by mouth 2 (two) times daily. Patient not taking: Reported on 10/24/2022 07/29/21   Carole Civil, MD  Tiotropium Bromide-Olodaterol (STIOLTO RESPIMAT) 2.5-2.5 MCG/ACT AERS Inhale 2 puffs into the lungs daily. 10/10/22   Chesley Mires, MD    Physical Exam: Vitals:   10/24/22 1514 10/24/22 1515 10/24/22 1937 10/24/22 2000  BP: (!) 161/68  (!) 126/95 (!) 142/62  Pulse: 93  87 76  Resp: '18  18 15  '$ Temp: 97.7 F (36.5 C)  98.3 F (36.8 C)   TempSrc: Oral  Oral   SpO2: 97%  99% 99%  Weight:  104.8 kg    Height:  '5\' 6"'$  (1.676 m)     General: Elderly male. Awake and alert and oriented x3. No acute cardiopulmonary distress.  HEENT: Normocephalic atraumatic.  Right and left ears normal in appearance.  Pupils equal, round, reactive to light. Extraocular muscles are intact. Sclerae anicteric and noninjected.  Moist mucosal membranes.  It appears that the patient has erythemic and raised lesions in the mucosal membranes of his upper palate Neck: Neck supple without lymphadenopathy. No carotid bruits. No masses palpated.  Cardiovascular: Regular rate with normal S1-S2 sounds. No murmurs, rubs, gallops  auscultated. No JVD.  Respiratory: Good respiratory effort with no wheezes, rales, rhonchi. Lungs clear to auscultation bilaterally.  No accessory muscle use. Abdomen: Soft, nontender, nondistended. Active bowel sounds. No masses or hepatosplenomegaly  Skin: The patient has diffuse hives on his neck, chest, back, face, head, extremities.  These are pruritic in nature and raised.  Dry, warm to touch. 2+ dorsalis pedis and radial pulses. Musculoskeletal: No calf or leg pain. All major joints not erythematous nontender.  No upper or lower joint deformation.  Good ROM.  No contractures  Psychiatric: Intact judgment and insight. Pleasant and cooperative. Neurologic: No focal neurological deficits. Strength is 5/5 and symmetric in upper and lower extremities.  Cranial nerves II through XII are grossly intact.  Data Reviewed: Results for orders placed or performed during the hospital encounter of 10/24/22 (from the past 24 hour(s))  CBC with Differential     Status: Abnormal   Collection Time: 10/24/22  4:33 PM  Result Value Ref Range   WBC 11.8 (H) 4.0 - 10.5 K/uL   RBC 4.84 4.22 - 5.81 MIL/uL   Hemoglobin 13.6 13.0 - 17.0 g/dL   HCT 39.7 39.0 - 52.0 %   MCV 82.0 80.0 - 100.0 fL   MCH 28.1 26.0 - 34.0 pg   MCHC 34.3 30.0 - 36.0 g/dL   RDW 14.6 11.5 - 15.5 %   Platelets 250 150 - 400 K/uL   nRBC 0.0 0.0 - 0.2 %   Neutrophils Relative % 73 %   Neutro Abs 8.7 (H) 1.7 - 7.7 K/uL   Lymphocytes Relative 22 %   Lymphs Abs 2.6 0.7 - 4.0 K/uL   Monocytes Relative 4 %   Monocytes Absolute 0.5 0.1 - 1.0 K/uL   Eosinophils Relative 0 %   Eosinophils Absolute 0.0 0.0 -  0.5 K/uL   Basophils Relative 0 %   Basophils Absolute 0.0 0.0 - 0.1 K/uL   Immature Granulocytes 1 %   Abs Immature Granulocytes 0.10 (H) 0.00 - 0.07 K/uL  Basic metabolic panel     Status: Abnormal   Collection Time: 10/24/22  4:33 PM  Result Value Ref Range   Sodium 127 (L) 135 - 145 mmol/L   Potassium 4.2 3.5 - 5.1 mmol/L    Chloride 95 (L) 98 - 111 mmol/L   CO2 21 (L) 22 - 32 mmol/L   Glucose, Bld 274 (H) 70 - 99 mg/dL   BUN 19 8 - 23 mg/dL   Creatinine, Ser 0.99 0.61 - 1.24 mg/dL   Calcium 8.6 (L) 8.9 - 10.3 mg/dL   GFR, Estimated >60 >60 mL/min   Anion gap 11 5 - 15   CT Soft Tissue Neck W Contrast  Result Date: 10/24/2022 CLINICAL DATA:  Infection suspected. Patient feels like throat closing in. EXAM: CT NECK WITH CONTRAST TECHNIQUE: Multidetector CT imaging of the neck was performed using the standard protocol following the bolus administration of intravenous contrast. RADIATION DOSE REDUCTION: This exam was performed according to the departmental dose-optimization program which includes automated exposure control, adjustment of the mA and/or kV according to patient size and/or use of iterative reconstruction technique. CONTRAST:  4m OMNIPAQUE IOHEXOL 300 MG/ML  SOLN COMPARISON:  None Available. FINDINGS: Pharynx and larynx: There is nonspecific soft tissue edema in the tongue and likely also (series 5, image 63). Combined results in narrowing of the oropharyngeal airway. There is no evidence of soft tissue stranding in this region to definitively suggest infection. There is likely edema and mild soft tissue stranding of the level of the aryepiglottic folds (series 2, image 35). There is narrowing of the airway at this level with the airway measuring up to 3 mm. Salivary glands: No inflammation, mass, or stone. Thyroid: Normal. Lymph nodes: None enlarged or abnormal density. Vascular: Negative. Limited intracranial: Negative. Visualized orbits: Negative. Mastoids and visualized paranasal sinuses: Clear. Skeleton: Postsurgical changes from C3-C5 ACDF. Upper chest: Negative. Other: There is nonspecific soft tissue stranding in the subcutaneous soft tissues overlying the lower right neck, in the region of the thyroid gland (series 2, image 67). IMPRESSION: 1. Nonspecific soft tissue edema in the tongue, the uvula, the  level of the aryepiglottic folds, and at the level of the glottis with narrowing of the airway which measures up to 3 mm at the level of the glottis. There is no evidence of soft tissue stranding in this region to definitively suggest infection. Findings could be seen in the setting of angioedema. If the patient develops infectious symptoms, repeat contrast enhanced neck CT is recommended. 2. Nonspecific soft tissue stranding in the subcutaneous soft tissues overlying the lower right neck, in the region of the thyroid gland. Findings were discussed with Dr. PAlvino Chapelon 10/24/22 at 6:28 PM. Electronically Signed   By: HMarin RobertsM.D.   On: 10/24/2022 18:28     Assessment and Plan: No notes have been filed under this hospital service. Service: Hospitalist  Principal Problem:   Angioedema Active Problems:   COPD [place]ch[place]   Hives  Angioedema Patient is not currently on any medications causing angioedema Epinephrine 0.3 mg every 3 hours as needed for hives and angioedema. Admission to progressive care Hives Vistaril as this works better than Benadryl for the patient Pepcid twice daily IV We will avoid steroids at this moment due to  question of this causing the patient's symptoms COPD OSA Not currently on CPAP   Advance Care Planning:   Code Status: Full Code discussed with patient  Consults: none  Family Communication: Wife present during interview and exam  Severity of Illness: The appropriate patient status for this patient is INPATIENT. Inpatient status is judged to be reasonable and necessary in order to provide the required intensity of service to ensure the patient's safety. The patient's presenting symptoms, physical exam findings, and initial radiographic and laboratory data in the context of their chronic comorbidities is felt to place them at high risk for further clinical deterioration. Furthermore, it is not anticipated that the patient  will be medically stable for discharge from the hospital within 2 midnights of admission.   * I certify that at the point of admission it is my clinical judgment that the patient will require inpatient hospital care spanning beyond 2 midnights from the point of admission due to high intensity of service, high risk for further deterioration and high frequency of surveillance required.*  Author: Truett Mainland, DO 10/24/2022 8:52 PM  For on call review [email or web address].

## 2022-10-24 NOTE — ED Triage Notes (Signed)
Pt is allergic to prednisone, was prescribed a medrol dose pack and he took it. Has finished the pack and he has been having whelps since Monday. Now the whelps are starting to come up on his face and neck. No difficulty breathing or swallowing.

## 2022-10-24 NOTE — ED Provider Notes (Signed)
[facility] Provider Note   CSN: [id number] Arrival date & time: 10/24/22  1501     History  Chief Complaint  Patient presents with   Allergic Reaction    CARMI [name] is a 67 y.o. male.   Allergic Reaction Patient presents with rash.  Hives.  Diffuse.  Is had over the last few days.  Started after taking a Medrol Dosepak for some swelling in his neck after recent neck surgery.  Patient states he realized he has had allergy to prednisone in the past.  States that gave him swelling.  States he saw his PCP and started on Vistaril.  States continued swelling.  Has lesions on face hands and mostly over most of body.  States it feels as if he is having trouble swallowing and pain in his mouth also.  No difficulty breathing.    Past Medical History:  Diagnosis Date   Anxiety    Arthritis    Asthma    BPH (benign prostatic hyperplasia)    Complication of anesthesia    pt had a hard time being able to move after spinal anesthesia , 3-4 hours   Depression    GERD (gastroesophageal reflux disease)    Gout    no meds   Headache(784.0)    otc meds prn   Heart murmur    dx as a child, no problems as an adult   History of hiatal hernia    Hyperlipidemia    IBS (irritable bowel syndrome)    PONV (postoperative nausea and vomiting)    Pre-diabetes    Borderline, diet and exercise, no med   Sleep apnea    uses CIPAP machine at night   Wears partial dentures    bottom partial    Home Medications Prior to Admission medications   Medication Sig Start Date End Date Taking? Authorizing Provider  acetaminophen (TYLENOL) 500 MG tablet Take 1,000 mg by mouth every 6 (six) hours as needed for moderate pain.   Yes [provider]  cyclobenzaprine (FLEXERIL) 5 MG tablet Take 5 mg by mouth 3 (three) times daily as needed for muscle spasms. 10/14/22  Yes [provider]  HYDROcodone-acetaminophen (NORCO/VICODIN) 5-325 MG  tablet Take 1 tablet by mouth every 4 (four) hours as needed for moderate pain. 10/14/22 10/14/23 Yes Meyran, Ocie Cornfield, NP  hydrOXYzine (VISTARIL) 25 MG capsule Take 1 capsule (25 mg total) by mouth every 8 (eight) hours as needed. 10/22/22  Yes Lindell Spar, MD  Menthol, Topical Analgesic, (ICY HOT EX) Apply 1 application topically daily as needed (pain).   Yes [provider]  pantoprazole (PROTONIX) 40 MG tablet TAKE (1) TABLET TWICE A DAY BEFORE MEALS. Patient taking differently: Take 40 mg by mouth daily. 06/04/20  Yes Ezzard Standing PA-C  PROAIR HFA 108 [phone] Base) MCG/ACT inhaler Inhale 2 puffs into the lungs every 6 (six) hours as needed for wheezing or shortness of breath. 10/01/22  Yes Johnette Abraham, MD  rosuvastatin (CRESTOR) 20 MG tablet Take 1 tablet (20 mg total) by mouth daily. 07/07/22  Yes Johnette Abraham, MD  sertraline (ZOLOFT) 50 MG tablet Take 50 mg by mouth in the morning. 12/24/10  Yes [provider]  cyclobenzaprine (FLEXERIL) 10 MG tablet Take 1 tablet (10 mg total) by mouth 3 (three) times daily as needed for muscle spasms. Do not take with Robaxin Patient not taking: Reported on 10/24/2022 10/14/22   Meyran, Ocie Cornfield,  NP  EPINEPHrine 0.3 mg/0.3 mL IJ SOAJ injection Inject 0.3 mg into the muscle as needed for anaphylaxis. 10/22/22   Lindell Spar, MD  furosemide (LASIX) 20 MG tablet Take 1 tablet (20 mg total) by mouth daily as needed (shortness of breath). Patient not taking: Reported on 10/24/2022 09/26/22   [name], Christopher P, MD  gabapentin (NEURONTIN) 100 MG capsule Take 1 capsule (100 mg total) by mouth 3 (three) times daily as needed. Patient not taking: Reported on 10/24/2022 05/15/22   [name], Christopher Scale, PA-C  nitroGLYCERIN (NITROSTAT) 0.4 MG SL tablet PLACE (1) TABLET UNDER TONGUE EVERY 5 MINUTES UP TO (3) DOSES. IF NO RELIEF CALL 911. Patient taking differently: Place 0.4 mg under the tongue every 5 (five) minutes as needed for  chest pain. 09/02/22   Christopher Lenis, MD  pregabalin (LYRICA) 50 MG capsule Take 1 capsule (50 mg total) by mouth 2 (two) times daily. Patient not taking: Reported on 10/24/2022 07/29/21   Christopher Civil, MD  Tiotropium Bromide-Olodaterol (STIOLTO RESPIMAT) 2.5-2.5 MCG/ACT AERS Inhale 2 puffs into the lungs daily. 10/10/22   Christopher Mires, MD      Allergies    Celebrex [celecoxib], Cortisone, Doxycycline, Prednisone, Relafen [nabumetone], Codeine, Aspirin, and Oxycodone-acetaminophen    Review of Systems   Review of Systems  Physical Exam Updated Vital Signs BP (!) 160/69   Pulse 80   Temp 98.3 F (36.8 C) (Oral)   Resp 19   Ht '5\' 6"'$  (1.676 m)   Wt 104.8 kg   SpO2 96%   BMI 37.28 kg/m  Physical Exam Vitals reviewed.  HENT:     Head: Atraumatic.     Mouth/Throat:     Comments: Does have erythematous lesion on soft palate Eyes:     Pupils: Pupils are equal, round, and reactive to light.  Neck:     Comments: Wound with Steri-Strips on anterior lower right neck from recent neck surgery Cardiovascular:     Rate and Rhythm: Regular rhythm.  Pulmonary:     Breath sounds: No wheezing, rhonchi or rales.  Abdominal:     Tenderness: There is no abdominal tenderness.  Skin:    Comments: Diffuse raised hive-like lesions.  Does have surrounding erythema.  Lesions up to 2 cm.  It is on face neck chest extremities palms and feet.  No sloughing of skin.  Neurological:     Mental Status: He is alert.          ED Results / Procedures / Treatments   Labs (all labs ordered are listed, but only abnormal results are displayed) Labs Reviewed  CBC WITH DIFFERENTIAL/PLATELET - Abnormal; Notable for the following components:      Result Value   WBC 11.8 (*)    Neutro Abs 8.7 (*)    Abs Immature Granulocytes 0.10 (*)    All other components within normal limits  BASIC METABOLIC PANEL - Abnormal; Notable for the following components:   Sodium 127 (*)    Chloride 95 (*)    CO2  21 (*)    Glucose, Bld 274 (*)    Calcium 8.6 (*)    All other components within normal limits  BASIC METABOLIC PANEL  CBC    EKG None  Radiology CT Soft Tissue Neck W Contrast  Result Date: 10/24/2022 CLINICAL DATA:  Infection suspected. Patient feels like throat closing in. EXAM: CT NECK WITH CONTRAST TECHNIQUE: Multidetector CT imaging of the neck was performed using the standard protocol following the  bolus administration of intravenous contrast. RADIATION DOSE REDUCTION: This exam was performed according to the departmental dose-optimization program which includes automated exposure control, adjustment of the mA and/or kV according to patient size and/or use of iterative reconstruction technique. CONTRAST:  2m OMNIPAQUE IOHEXOL 300 MG/ML  SOLN COMPARISON:  None Available. FINDINGS: Pharynx and larynx: There is nonspecific soft tissue edema in the tongue and likely also (series 5, image 63). Combined results in narrowing of the oropharyngeal airway. There is no evidence of soft tissue stranding in this region to definitively suggest infection. There is likely edema and mild soft tissue stranding of the level of the aryepiglottic folds (series 2, image 35). There is narrowing of the airway at this level with the airway measuring up to 3 mm. Salivary glands: No inflammation, mass, or stone. Thyroid: Normal. Lymph nodes: None enlarged or abnormal density. Vascular: Negative. Limited intracranial: Negative. Visualized orbits: Negative. Mastoids and visualized paranasal sinuses: Clear. Skeleton: Postsurgical changes from C3-C5 ACDF. Upper chest: Negative. Other: There is nonspecific soft tissue stranding in the subcutaneous soft tissues overlying the lower right neck, in the region of the thyroid gland (series 2, image 67). IMPRESSION: 1. Nonspecific soft tissue edema in the tongue, the uvula, the level of the aryepiglottic folds, and at the level of the glottis with narrowing of the airway which  measures up to 3 mm at the level of the glottis. There is no evidence of soft tissue stranding in this region to definitively suggest infection. Findings could be seen in the setting of angioedema. If the patient develops infectious symptoms, repeat contrast enhanced neck CT is recommended. 2. Nonspecific soft tissue stranding in the subcutaneous soft tissues overlying the lower right neck, in the region of the thyroid gland. Findings were discussed with Dr. PAlvino Chapelon 10/24/22 at 6:28 PM. Electronically Signed   By: HMarin RobertsM.D.   On: 10/24/2022 18:28    Procedures Procedures    Medications Ordered in ED Medications  HYDROcodone-acetaminophen (NORCO/VICODIN) 5-325 MG per tablet 1 tablet (1 tablet Oral Given 10/24/22 2155)  rosuvastatin (CRESTOR) tablet 20 mg (20 mg Oral Given 10/24/22 2156)  sertraline (ZOLOFT) tablet 50 mg (has no administration in time range)  pantoprazole (PROTONIX) EC tablet 40 mg (40 mg Oral Given 10/24/22 2157)  cyclobenzaprine (FLEXERIL) tablet 5 mg (has no administration in time range)  albuterol (VENTOLIN HFA) 108 (90 Base) MCG/ACT inhaler 2 puff (has no administration in time range)  enoxaparin (LOVENOX) injection 40 mg (40 mg Subcutaneous Given 10/24/22 2157)  hydrOXYzine (VISTARIL) injection 25 mg (25 mg Intramuscular Given 10/24/22 2156)  ondansetron (ZOFRAN) tablet 4 mg (has no administration in time range)    Or  ondansetron (ZOFRAN) injection 4 mg (has no administration in time range)  famotidine (PEPCID) IVPB 20 mg premix (0 mg Intravenous Stopped 10/24/22 2226)  EPINEPHrine (EPI-PEN) injection 0.3 mg (has no administration in time range)  diphenhydrAMINE (BENADRYL) injection 25 mg (25 mg Intravenous Given 10/24/22 1721)  famotidine (PEPCID) IVPB 20 mg premix (0 mg Intravenous Stopped 10/24/22 1751)  EPINEPHrine (EPI-PEN) injection 0.3 mg (0.3 mg Intramuscular Given 10/24/22 1729)  iohexol (OMNIPAQUE) 300 MG/ML solution 75 mL (75 mLs Intravenous Contrast Given  10/24/22 1745)  EPINEPHrine (EPI-PEN) injection 0.3 mg (0.3 mg Intramuscular Given 10/24/22 2021)    ED Course/ Medical Decision Making/ A&P                             Medical Decision  Making Amount and/or Complexity of Data Reviewed Labs: ordered. Radiology: ordered.  Risk Prescription drug management. Decision regarding hospitalization.   Patient with diffuse rash.  Hive-like.  Also appears that there may be some mucous membrane involvement.  Has been worsening.  Reportedly cannot do prednisone and just got Medrol Dosepak that potentially caused the rash. Has some difficulty swallowing.  With recent surgery will get CT scan to evaluate neck.  Also basic blood work.  Blood work does show elevated sugar.  Potentially secondary to steroids.  Sodium slightly low but does correct to a little higher due to the glucose.  Given EpiPen and feels better.  Rash is improved with less itching but still present.  CT scan done and does show however some superior glottic narrowing likely edema.  Infection felt less likely.  No difficulty breathing at this time voice reassuring but had had some difficulty with swallowing.  I think with this he would benefit from monitoring.  Will discuss with hospitalist for potential admission.  Well-appearing at this time.  Patient has had recurrence of the eyes and neck is feeling a little tight again.  Will redose epinephrine.  Will discuss with ICU at [facility].  About need of ICU care versus stepdown bed or potential epi drip.  CRITICAL CARE Performed by: Davonna Belling Total critical care time:30 minutes Critical care time was exclusive of separately billable procedures and treating other patients. Critical care was necessary to treat or prevent imminent or life-threatening deterioration. Critical care was time spent personally by me on the following activities: development of treatment plan with patient and/or surrogate as well as nursing, discussions with  consultants, evaluation of patient's response to treatment, examination of patient, obtaining history from patient or surrogate, ordering and performing treatments and interventions, ordering and review of laboratory studies, ordering and review of radiographic studies, pulse oximetry and re-evaluation of patient's condition.         Final Clinical Impression(s) / ED Diagnoses Final diagnoses:  Allergic reaction, initial encounter    Rx / DC Orders ED Discharge Orders     None         Davonna Belling, MD 10/24/22 2312

## 2022-10-25 DIAGNOSIS — T783XXA Angioneurotic edema, initial encounter: Secondary | ICD-10-CM | POA: Diagnosis not present

## 2022-10-25 LAB — BASIC METABOLIC PANEL
Anion gap: 9 (ref 5–15)
BUN: 16 mg/dL (ref 8–23)
CO2: 23 mmol/L (ref 22–32)
Calcium: 8.1 mg/dL — ABNORMAL LOW (ref 8.9–10.3)
Chloride: 97 mmol/L — ABNORMAL LOW (ref 98–111)
Creatinine, Ser: 0.88 mg/dL (ref 0.61–1.24)
GFR, Estimated: >60 mL/min (ref >60)
Glucose, Bld: 321 mg/dL — ABNORMAL HIGH (ref 70–99)
Potassium: 4.4 mmol/L (ref 3.5–5.1)
Sodium: 129 mmol/L — ABNORMAL LOW (ref 135–145)

## 2022-10-25 LAB — CBC
HCT: 39 % (ref 39.0–52.0)
Hemoglobin: 13.5 g/dL (ref 13.0–17.0)
MCH: 28.3 pg (ref 26.0–34.0)
MCHC: 34.6 g/dL (ref 30.0–36.0)
MCV: 81.8 fL (ref 80.0–100.0)
Platelets: 244 10*3/uL (ref 150–400)
RBC: 4.77 MIL/uL (ref 4.22–5.81)
RDW: 14.3 % (ref 11.5–15.5)
WBC: 12.8 10*3/uL — ABNORMAL HIGH (ref 4.0–10.5)
nRBC: 0 % (ref 0.0–0.2)

## 2022-10-25 MED ORDER — HYDROXYZINE HCL 50 MG/ML IM SOLN
25.0000 mg | INTRAMUSCULAR | Status: DC
  Administered 2022-10-25 – 2022-10-26 (×3): 25 mg via INTRAMUSCULAR
  Filled 2022-10-25 (×5): qty 0.5

## 2022-10-25 MED ORDER — DIPHENHYDRAMINE HCL 50 MG/ML IJ SOLN
25.0000 mg | Freq: Once | INTRAMUSCULAR | Status: AC
  Administered 2022-10-25: 25 mg via INTRAVENOUS
  Filled 2022-10-25: qty 1

## 2022-10-25 MED ORDER — HYDROXYZINE HCL 50 MG/ML IM SOLN
50.0000 mg | Freq: Once | INTRAMUSCULAR | Status: AC
  Administered 2022-10-25: 50 mg via INTRAMUSCULAR

## 2022-10-25 MED ORDER — LIDOCAINE VISCOUS HCL 2 % MT SOLN
15.0000 mL | Freq: Once | OROMUCOSAL | Status: AC
  Administered 2022-10-25: 15 mL via ORAL
  Filled 2022-10-25: qty 15

## 2022-10-25 MED ORDER — DIPHENHYDRAMINE HCL 50 MG/ML IJ SOLN: 50.0000 mg | Freq: Once | INTRAMUSCULAR | Status: DC

## 2022-10-25 MED ORDER — ALUM & MAG HYDROXIDE-SIMETH 200-200-20 MG/5ML PO SUSP
30.0000 mL | Freq: Once | ORAL | Status: AC
  Administered 2022-10-25: 30 mL via ORAL
  Filled 2022-10-25: qty 30

## 2022-10-25 NOTE — Hospital Course (Signed)
58 y.om w/ HLD, prediabetes, hypertension, BPH, OSA, recent neck surgery-C3-4, C4-5 fusion Dr Saintclair Halsted 10/13/22  who was discharged on a Medrol Dosepak due to concerns about neck swelling ( has history of allergy to prednisone and cortisone where he develops swelling) , who presents to the ED with complaint of hives upon completion of his Medrol Dosepak. He does feel like it is a little difficult to swallow, but does not have any problems breathing.  Saw his PCP who started him on Vistaril to help with itching In the ED hemodynamically stable, labs with mild leukocytosis, CT scan here shows some angioedema of the glottis.  He received 1 dose of epinephrine which was helpful in reducing both the throat irritation/difficulty swallowing as well as the hives.  Within a couple of hours, these symptoms returned.  The EDP discussed the patient with critical care at [facility] who advised admission to progressive bed at [facility]

## 2022-10-25 NOTE — ED Notes (Signed)
No return page yet from hospitalist. EDP called to bedside to assess airway and stability for transfer to [facility] ready bed. Per EDP, hives are noticeable, airway patent with no concerns for airway compromise. Verbal order for 25 mg Benadryl IV.

## 2022-10-25 NOTE — ED Notes (Signed)
Hospitalist at beside assessing pt. Per MD, airway without swelling. Stable for transport to [facility] and may go to PCU as previously ordered. Meds given as ordered per MAR.

## 2022-10-25 NOTE — ED Notes (Signed)
Pt says he is starting to feel a little better, epigastric discomfort improving, pt also says he is belching and that is helping.

## 2022-10-25 NOTE — ED Notes (Signed)
Pt reports increased hives, tightness in throat, itching despite Epi IM given about an hour ago. MD paged.

## 2022-10-25 NOTE — ED Notes (Addendum)
Pt c/o heartburn- will check admission orders for meds- no meds available- Dr Matilde Sprang notified.

## 2022-10-25 NOTE — Plan of Care (Signed)

## 2022-10-25 NOTE — ED Notes (Signed)
Ice pack to neck.

## 2022-10-25 NOTE — ED Notes (Signed)
Report given to [facility] 

## 2022-10-25 NOTE — ED Notes (Signed)
Pt given icepack for neck pain, rash, and itching

## 2022-10-25 NOTE — Progress Notes (Signed)
PROGRESS NOTE Christopher [name]  [id number] DOB: 1956-01-24 DOA: 10/24/2022 PCP: Johnette Abraham, MD   Brief Narrative/Hospital Course: 42 y.om w/ HLD, prediabetes, hypertension, BPH, OSA, recent neck surgery-C3-4, C4-5 fusion Dr Saintclair Halsted 10/13/22  who was discharged on a Medrol Dosepak due to concerns about neck swelling ( has history of allergy to prednisone and cortisone where he develops swelling) , who presents to the ED with complaint of hives upon completion of his Medrol Dosepak. He does feel like it is a little difficult to swallow, but does not have any problems breathing.  Saw his PCP who started him on Vistaril to help with itching In the ED hemodynamically stable, labs with mild leukocytosis, CT scan here shows some angioedema of the glottis.  He received 1 dose of epinephrine which was helpful in reducing both the throat irritation/difficulty swallowing as well as the hives.  Within a couple of hours, these symptoms returned.  The EDP discussed the patient with critical care at [facility] who advised admission to progressive bed at [facility]    Subjective: Seen and examined this morning wife at the bedside patient is alert oriented x 3, complains of mild tenderness palms Reports he feels better Swallowing difficulty speech difficulty respiratory difficulty Has hives on the back Eyes not swollen  Assessment and Plan: Principal Problem:   Angioedema Active Problems:   COPD (chronic obstructive pulmonary disease) (HCC)   Hives   Angioedema: Not on any new medication currently no other inciting event, likely from [place].  Currently no difficulty swallowing or speaking.  Received AP x 2 in the ED.  Pulmonary critical consulted and waiting for bed at progressive care [facility].  Continue epinephrine PRN, continue Vistaril/Pepcid  Hives continue to be stable PRN, Pepcid, continue to avoid steroids due to question of this causing patient's symptoms. COPD currently not wheezing, not on  oxygen.  Prior PRN HLD continue statin. Anxiety continue Zoloft Recent neck surgery dressing in place no new complaints Obesity Body mass index is 37.28 kg/m. : Will benefit with PCP follow-up, weight loss  healthy lifestyle and outpatient sleep evaluation.   DVT prophylaxis: enoxaparin (LOVENOX) injection 40 mg Start: 10/24/22 2200 Code Status:   Code Status: Full Code Family Communication: plan of care discussed with patient/WIFE at bedside. Patient status is: admitted as observation but remains hospitalized for ongoing  because of angioedema Level of care: Progressive   Dispo: The patient is from: Home            Anticipated disposition: Waiting for bed at [facility] Objective: Vitals last 24 hrs: Vitals:   10/25/22 0553 10/25/22 0600 10/25/22 0630 10/25/22 0700  BP: (!) 144/74 (!) 145/80  (!) 153/54  Pulse: 98 90  84  Resp: '20 17  19  '$ Temp: 98.5 F (36.9 C)     TempSrc: Oral     SpO2: 98% 96% 96% 99%  Weight:      Height:       Weight change:   Physical Examination: General exam: alert awake, oriented, pleasant, on room air older than stated age HEENT:Oral mucosa moist, Ear/Nose WNL grossly, able to visualize base of uvula, no  tongue OR LIP swelling Respiratory system: bilaterally clear BS, no use of accessory muscle Cardiovascular system: S1 & S2 +, No JVD. Gastrointestinal system: Abdomen soft,NT,ND, BS+ Nervous System:Alert, awake, moving extremities. Extremities: LE edema neg,distal peripheral pulses palpable.  Skin: hives mostly on back, left cheek,,no icterus. MSK: Normal muscle bulk,tone, power, anterior neck surgical  site with dressing in place dry intact  Medications reviewed:  Scheduled Meds:  enoxaparin (LOVENOX) injection  40 mg Subcutaneous Q24H   hydrOXYzine  25 mg Intramuscular Q4H   pantoprazole  40 mg Oral Daily   rosuvastatin  20 mg Oral Daily   sertraline  50 mg Oral q AM   Continuous Infusions:  famotidine (PEPCID) IV Stopped (10/24/22  2226)    Diet Order             Diet clear liquid Room service appropriate? Yes; Fluid consistency: Thin  Diet effective now                  Intake/Output Summary (Last 24 hours) at 10/25/2022 0734 Last data filed at 10/24/2022 1751 Gross per 24 hour  Intake 50 ml  Output --  Net 50 ml   Net IO Since Admission: 50 mL [10/25/22 0734]  Wt Readings from Last 3 Encounters:  10/24/22 104.8 kg  10/22/22 106.5 kg  10/13/22 109.3 kg     Unresulted Labs (From admission, onward)    None     Data Reviewed: I have personally reviewed following labs and imaging studies CBC: Recent Labs  Lab 10/24/22 1633 10/25/22 0624  WBC 11.8* 12.8*  NEUTROABS 8.7*  --   HGB 13.6 13.5  HCT 39.7 39.0  MCV 82.0 81.8  PLT 250 [id number]   Basic Metabolic Panel: Recent Labs  Lab 10/24/22 1633 10/25/22 0624  NA 127* 129*  K 4.2 4.4  CL 95* 97*  CO2 21* 23  GLUCOSE 274* 321*  BUN 19 16  CREATININE 0.99 0.88  CALCIUM 8.6* 8.1*  No results found for this or any previous visit (from the past 240 hour(s)).  Antimicrobials: Anti-infectives (From admission, onward)    None      Culture/Microbiology    Component Value Date/Time   SDES WOUND 02/18/2022 1503   SPECREQUEST RIGHT KNEE DEEP SPEC C 02/18/2022 1503   CULT  02/18/2022 1503    No growth aerobically or anaerobically. Performed at [facility], [place] [address], [place], [place] [address]    REPTSTATUS 02/23/2022 FINAL 02/18/2022 1503  Radiology Studies: CT Soft Tissue Neck W Contrast  Result Date: 10/24/2022 CLINICAL DATA:  Infection suspected. Patient feels like throat closing in. EXAM: CT NECK WITH CONTRAST TECHNIQUE: Multidetector CT imaging of the neck was performed using the standard protocol following the bolus administration of intravenous contrast. RADIATION DOSE REDUCTION: This exam was performed according to the departmental dose-optimization program which includes automated exposure control, adjustment of the mA  and/or kV according to patient size and/or use of iterative reconstruction technique. CONTRAST:  75m OMNIPAQUE IOHEXOL 300 MG/ML  SOLN COMPARISON:  None Available. FINDINGS: Pharynx and larynx: There is nonspecific soft tissue edema in the tongue and likely also (series 5, image 63). Combined results in narrowing of the oropharyngeal airway. There is no evidence of soft tissue stranding in this region to definitively suggest infection. There is likely edema and mild soft tissue stranding of the level of the aryepiglottic folds (series 2, image 35). There is narrowing of the airway at this level with the airway measuring up to 3 mm. Salivary glands: No inflammation, mass, or stone. Thyroid: Normal. Lymph nodes: None enlarged or abnormal density. Vascular: Negative. Limited intracranial: Negative. Visualized orbits: Negative. Mastoids and visualized paranasal sinuses: Clear. Skeleton: Postsurgical changes from C3-C5 ACDF. Upper chest: Negative. Other: There is nonspecific soft tissue stranding in the subcutaneous soft tissues overlying the lower  right neck, in the region of the thyroid gland (series 2, image 67). IMPRESSION: 1. Nonspecific soft tissue edema in the tongue, the uvula, the level of the aryepiglottic folds, and at the level of the glottis with narrowing of the airway which measures up to 3 mm at the level of the glottis. There is no evidence of soft tissue stranding in this region to definitively suggest infection. Findings could be seen in the setting of angioedema. If the patient develops infectious symptoms, repeat contrast enhanced neck CT is recommended. 2. Nonspecific soft tissue stranding in the subcutaneous soft tissues overlying the lower right neck, in the region of the thyroid gland. Findings were discussed with Dr. Alvino Chapel on 10/24/22 at 6:28 PM. Electronically Signed   By: Marin Roberts M.D.   On: 10/24/2022 18:28     LOS: 1 day   Antonieta Pert, MD [facility]  10/25/2022, 7:34 AM

## 2022-10-25 NOTE — ED Notes (Signed)
Pt sitting in chair at this time

## 2022-10-26 DIAGNOSIS — T783XXA Angioneurotic edema, initial encounter: Secondary | ICD-10-CM | POA: Diagnosis not present

## 2022-10-26 LAB — COMPREHENSIVE METABOLIC PANEL
ALT: 38 U/L (ref 0–44)
AST: 40 U/L (ref 15–41)
Albumin: 2.8 g/dL — ABNORMAL LOW (ref 3.5–5.0)
Alkaline Phosphatase: 72 U/L (ref 38–126)
Anion gap: 9 (ref 5–15)
BUN: 11 mg/dL (ref 8–23)
CO2: 23 mmol/L (ref 22–32)
Calcium: 8 mg/dL — ABNORMAL LOW (ref 8.9–10.3)
Chloride: 97 mmol/L — ABNORMAL LOW (ref 98–111)
Creatinine, Ser: 1.04 mg/dL (ref 0.61–1.24)
GFR, Estimated: >60 mL/min (ref >60)
Glucose, Bld: 240 mg/dL — ABNORMAL HIGH (ref 70–99)
Potassium: 4.5 mmol/L (ref 3.5–5.1)
Sodium: 129 mmol/L — ABNORMAL LOW (ref 135–145)
Total Bilirubin: 0.8 mg/dL (ref 0.3–1.2)
Total Protein: 5.4 g/dL — ABNORMAL LOW (ref 6.5–8.1)

## 2022-10-26 LAB — CBC WITH DIFFERENTIAL/PLATELET
Abs Immature Granulocytes: 0.09 10*3/uL — ABNORMAL HIGH (ref 0.00–0.07)
Basophils Absolute: 0 10*3/uL (ref 0.0–0.1)
Basophils Relative: 0 %
Eosinophils Absolute: 0.1 10*3/uL (ref 0.0–0.5)
Eosinophils Relative: 1 %
HCT: 36.1 % — ABNORMAL LOW (ref 39.0–52.0)
Hemoglobin: 12.8 g/dL — ABNORMAL LOW (ref 13.0–17.0)
Immature Granulocytes: 1 %
Lymphocytes Relative: 16 %
Lymphs Abs: 1.8 10*3/uL (ref 0.7–4.0)
MCH: 28.5 pg (ref 26.0–34.0)
MCHC: 35.5 g/dL (ref 30.0–36.0)
MCV: 80.4 fL (ref 80.0–100.0)
Monocytes Absolute: 0.2 10*3/uL (ref 0.1–1.0)
Monocytes Relative: 2 %
Neutro Abs: 8.8 10*3/uL — ABNORMAL HIGH (ref 1.7–7.7)
Neutrophils Relative %: 80 %
Platelets: 259 10*3/uL (ref 150–400)
RBC: 4.49 MIL/uL (ref 4.22–5.81)
RDW: 14.2 % (ref 11.5–15.5)
WBC: 11 10*3/uL — ABNORMAL HIGH (ref 4.0–10.5)
nRBC: 0 % (ref 0.0–0.2)

## 2022-10-26 LAB — MAGNESIUM: Magnesium: 1.8 mg/dL (ref 1.7–2.4)

## 2022-10-26 LAB — PHOSPHORUS: Phosphorus: 3.6 mg/dL (ref 2.5–4.6)

## 2022-10-26 LAB — GLUCOSE, CAPILLARY: Glucose-Capillary: 214 mg/dL — ABNORMAL HIGH (ref 70–99)

## 2022-10-26 MED ORDER — FAMOTIDINE 20 MG PO TABS: 20.0000 mg | ORAL_TABLET | Freq: Two times a day (BID) | ORAL | Status: DC

## 2022-10-26 MED ORDER — LORATADINE 10 MG PO TABS
10.0000 mg | ORAL_TABLET | Freq: Two times a day (BID) | ORAL | Status: DC
  Administered 2022-10-26: 10 mg via ORAL
  Filled 2022-10-26: qty 1

## 2022-10-26 MED ORDER — HYDROXYZINE HCL 50 MG/ML IM SOLN
25.0000 mg | INTRAMUSCULAR | Status: DC | PRN
  Administered 2022-10-26: 25 mg via INTRAMUSCULAR
  Filled 2022-10-26: qty 0.5

## 2022-10-26 MED ORDER — FAMOTIDINE 20 MG PO TABS: 20.0000 mg | ORAL_TABLET | Freq: Two times a day (BID) | ORAL | 0 refills | Status: DC

## 2022-10-26 MED ORDER — LORATADINE 10 MG PO TABS: 10.0000 mg | ORAL_TABLET | Freq: Two times a day (BID) | ORAL | 0 refills | Status: DC

## 2022-10-26 MED ORDER — HYDROXYZINE HCL 25 MG PO TABS: 25.0000 mg | ORAL_TABLET | Freq: Four times a day (QID) | ORAL | 1 refills | Status: DC | PRN

## 2022-10-26 MED ORDER — ALUM & MAG HYDROXIDE-SIMETH 200-200-20 MG/5ML PO SUSP: 30.0000 mL | Freq: Four times a day (QID) | ORAL | Status: DC | PRN

## 2022-10-26 MED ORDER — HYDROXYZINE HCL 25 MG PO TABS: 25.0000 mg | ORAL_TABLET | ORAL | Status: DC | PRN

## 2022-10-26 NOTE — Progress Notes (Signed)
Patient discharging home. Vital signs stable at time of discharge as reflected in discharge summary. Discharge instructions given and verbal understanding returned. No questions at this time.

## 2022-10-26 NOTE — Discharge Summary (Signed)
Physician Discharge Summary  Christopher [name] [id number] DOB: 03/31/1956 DOA: 10/24/2022  PCP: Johnette Abraham, MD  Admit date: 10/24/2022 Discharge date: 10/26/2022  Time spent: 40 minutes  Recommendations for Outpatient Follow-up:  Follow outpatient CBC/CMP  Follow allergy outpatient Avoid steroids in future until allergy follow up    Discharge Diagnoses:  Principal Problem:   Angioedema Active Problems:   COPD (chronic obstructive pulmonary disease) ([place])   Hives   Discharge Condition: stable  Diet recommendation: heart healthy  Filed Weights   10/24/22 1515 10/25/22 1821  Weight: 104.8 kg 104 kg    History of present illness:   22 y.om w/ HLD, prediabetes, hypertension, BPH, OSA, recent neck surgery-C3-4, C4-5 fusion Dr Saintclair Halsted 10/13/22  who was discharged on [place] Medrol Dosepak due to concerns about neck swelling ( has history of allergy to prednisone and cortisone where he develops swelling) , who presents to the ED with complaint of hives upon completion of his Medrol Dosepak. He does feel like it is Marchel Foote little difficult to swallow, but does not have any problems breathing.  Saw his PCP who started him on Vistaril to help with itching In the ED hemodynamically stable, labs with mild leukocytosis, CT scan here shows some angioedema of the glottis.  He received 1 dose of epinephrine which was helpful in reducing both the throat irritation/difficulty swallowing as well as the hives.  Within [place] couple of hours, these symptoms returned.  The EDP discussed the patient with critical care at [facility] who advised admission to progressive bed at [facility]   Stable on 2/25 after observation.  Discharged with claritin, pepcid, atarax.  He already has epipen at home.  Needs allergy follow up.  Hospital Course:  Assessment and Plan:  Angioedema  Urticaria  Allergic Reaction: has hx allergy to prednisone which sounds similar (rash, swelling of throat, lips) - occurred about 6 years or so ago.   Discharged from last hospitalization of medrol dose pack.  CT at presentation with nonspecific soft tissue edema in tongue, uvula, level of aryepiglottic folds, and at the level of the glottis with narrowing of the airway.  Concerning for angioedema.   On exam today, he notes improvement - no issues breathing/swallowing - improvement in rash (though still present) S/p epi last around 230 PM 2/24 Will discharge with claritin BID, pepcid BID, and atarax prn.  He has epipen at home already.  Referral to allergy outpatient.  Return precautions.   COPD currently not wheezing, not on oxygen.  Prior PRN HLD continue statin. Anxiety continue Zoloft Recent neck surgery dressing in place no new complaints Obesity Body mass index is 37.28 kg/m. : Will benefit with PCP follow-up, weight loss  healthy lifestyle and outpatient sleep evaluation.    Procedures: none   Consultations: none  Discharge Exam: Vitals:   10/26/22 0918 10/26/22 1132  BP: (!) 130/59 132/73  Pulse: 84 99  Resp: 18 18  Temp: 98.1 F (36.7 C) 98.4 F (36.9 C)  SpO2: 94% 96%   Feeling much better No issues swallowing or breathing.  No tongue swelling.   Still with rash, but overall improved.  General: No acute distress. Mallampati 3 Cardiovascular: Heart sounds show Indigo Chaddock regular rate, and rhythm. No gallops or rubs. No murmurs. No JVD. Lungs: Clear to auscultation bilaterally with good air movement. No rales, rhonchi or wheezes.  No stridor. Abdomen: Soft, nontender, nondistended with normal active bowel sounds. No masses. No hepatosplenomegaly. Neurological: Alert and oriented 3. Moves all extremities  4. Cranial nerves II through XII grossly intact. Skin: diffuse urticaria  Extremities: No clubbing or cyanosis. No edema.   Discharge Instructions   Discharge Instructions     Ambulatory referral to Allergy   Complete by: As directed    Call MD for:  difficulty breathing, headache or visual disturbances   Complete  by: As directed    Call MD for:  extreme fatigue   Complete by: As directed    Call MD for:  hives   Complete by: As directed    Call MD for:  persistant dizziness or light-headedness   Complete by: As directed    Call MD for:  persistant nausea and vomiting   Complete by: As directed    Call MD for:  redness, tenderness, or signs of infection (pain, swelling, redness, odor or green/yellow discharge around incision site)   Complete by: As directed    Call MD for:  severe uncontrolled pain   Complete by: As directed    Call MD for:  temperature >100.4   Complete by: As directed    Diet - low sodium heart healthy   Complete by: As directed    Discharge instructions   Complete by: As directed    You were seen for an allergic reaction.  You've improved with epipen and antihistamines.  We'll send you home on claritin twice daily as well as pepcid twice daily.  Use hydroxyzine as needed for itching or rash.  You can schedule it at night.  Don't drive while using sedating medicines like hydroxyzine.  Use epipen and come to the hospital if you have worsening swelling and concern for issues breathing or swallowing.  Come to the hospital if you have any problems.    Avoid steroids in the future until you follow up with an allergist.  Return for new, recurrent, or worsening symptoms.  Please ask your PCP to request records from this hospitalization so they know what was done and what the next steps will be.   Increase activity slowly   Complete by: As directed       Allergies as of 10/26/2022       Reactions   Celebrex [celecoxib] Itching, Swelling, Other (See Comments)   All over   Cortisone Swelling   SWELLING REACTION UNSPECIFIED    Doxycycline Swelling, Other (See Comments)   Made tongue turn black    Prednisone Swelling   SWELLING REACTION UNSPECIFIED    Relafen [nabumetone] Swelling   SWELLING REACTION UNSPECIFIED    Codeine Nausea And Vomiting, Other (See Comments)    Extreme stomach pain. This includes anything with the derivative of codeine in it. (Does tolerate hydrocodone)    Medrol [methylprednisolone]    Angioedema   Aspirin Itching   Stomach cramps   Oxycodone-acetaminophen Itching, Other (See Comments)   Can tolerate with benadryl         Medication List     STOP taking these medications    furosemide 20 MG tablet Commonly known as: LASIX   gabapentin 100 MG capsule Commonly known as: NEURONTIN   hydrOXYzine 25 MG capsule Commonly known as: VISTARIL   pregabalin 50 MG capsule Commonly known as: LYRICA       TAKE these medications    acetaminophen 500 MG tablet Commonly known as: TYLENOL Take 1,000 mg by mouth every 6 (six) hours as needed for moderate pain.   cyclobenzaprine 10 MG tablet Commonly known as: FLEXERIL Take 1 tablet (10 mg total) by mouth 3 (  three) times daily as needed for muscle spasms. Do not take with Robaxin   cyclobenzaprine 5 MG tablet Commonly known as: FLEXERIL Take 5 mg by mouth 3 (three) times daily as needed for muscle spasms.   EPINEPHrine 0.3 mg/0.3 mL Soaj injection Commonly known as: EPI-PEN Inject 0.3 mg into the muscle as needed for anaphylaxis.   famotidine 20 MG tablet Commonly known as: PEPCID Take 1 tablet (20 mg total) by mouth 2 (two) times daily.   HYDROcodone-acetaminophen 5-325 MG tablet Commonly known as: NORCO/VICODIN Take 1 tablet by mouth every 4 (four) hours as needed for moderate pain.   hydrOXYzine 25 MG tablet Commonly known as: ATARAX Take 1 tablet (25 mg total) by mouth every 6 (six) hours as needed for itching or anxiety.   ICY HOT EX Apply 1 application topically daily as needed (pain).   loratadine 10 MG tablet Commonly known as: Claritin Take 1 tablet (10 mg total) by mouth 2 (two) times daily. Follow up with your PCP or allergist to discuss this med further   nitroGLYCERIN 0.4 MG SL tablet Commonly known as: NITROSTAT PLACE (1) TABLET UNDER TONGUE  EVERY 5 MINUTES UP TO (3) DOSES. IF NO RELIEF CALL 911. What changed: See the new instructions.   pantoprazole 40 MG tablet Commonly known as: PROTONIX TAKE (1) TABLET TWICE Daven Pinckney DAY BEFORE MEALS. What changed: See the new instructions.   ProAir HFA 108 (90 Base) MCG/ACT inhaler Generic drug: albuterol Inhale 2 puffs into the lungs every 6 (six) hours as needed for wheezing or shortness of breath.   rosuvastatin 20 MG tablet Commonly known as: Crestor Take 1 tablet (20 mg total) by mouth daily.   sertraline 50 MG tablet Commonly known as: ZOLOFT Take 50 mg by mouth in the morning.   Stiolto Respimat 2.5-2.5 MCG/ACT Aers Generic drug: Tiotropium Bromide-Olodaterol Inhale 2 puffs into the lungs daily.       Allergies  Allergen Reactions   Celebrex [Celecoxib] Itching, Swelling and Other (See Comments)    All over   Cortisone Swelling    SWELLING REACTION UNSPECIFIED    Doxycycline Swelling and Other (See Comments)    Made tongue turn black    Prednisone Swelling    SWELLING REACTION UNSPECIFIED    Relafen [Nabumetone] Swelling    SWELLING REACTION UNSPECIFIED    Codeine Nausea And Vomiting and Other (See Comments)    Extreme stomach pain. This includes anything with the derivative of codeine in it. (Does tolerate hydrocodone)    Medrol [Methylprednisolone]     Angioedema    Aspirin Itching    Stomach cramps   Oxycodone-Acetaminophen Itching and Other (See Comments)    Can tolerate with benadryl       The results of significant diagnostics from this hospitalization (including imaging, microbiology, ancillary and laboratory) are listed below for reference.    Significant Diagnostic Studies: CT Soft Tissue Neck W Contrast  Result Date: 10/24/2022 CLINICAL DATA:  Infection suspected. Patient feels like throat closing in. EXAM: CT NECK WITH CONTRAST TECHNIQUE: Multidetector CT imaging of the neck was performed using the standard protocol following the bolus  administration of intravenous contrast. RADIATION DOSE REDUCTION: This exam was performed according to the departmental dose-optimization program which includes automated exposure control, adjustment of the mA and/or kV according to patient size and/or use of iterative reconstruction technique. CONTRAST:  70m OMNIPAQUE IOHEXOL 300 MG/ML  SOLN COMPARISON:  None Available. FINDINGS: Pharynx and larynx: There is nonspecific soft tissue edema in the  tongue and likely also (series 5, image 63). Combined results in narrowing of the oropharyngeal airway. There is no evidence of soft tissue stranding in this region to definitively suggest infection. There is likely edema and mild soft tissue stranding of the level of the aryepiglottic folds (series 2, image 35). There is narrowing of the airway at this level with the airway measuring up to 3 mm. Salivary glands: No inflammation, mass, or stone. Thyroid: Normal. Lymph nodes: None enlarged or abnormal density. Vascular: Negative. Limited intracranial: Negative. Visualized orbits: Negative. Mastoids and visualized paranasal sinuses: Clear. Skeleton: Postsurgical changes from C3-C5 ACDF. Upper chest: Negative. Other: There is nonspecific soft tissue stranding in the subcutaneous soft tissues overlying the lower right neck, in the region of the thyroid gland (series 2, image 67). IMPRESSION: 1. Nonspecific soft tissue edema in the tongue, the uvula, the level of the aryepiglottic folds, and at the level of the glottis with narrowing of the airway which measures up to 3 mm at the level of the glottis. There is no evidence of soft tissue stranding in this region to definitively suggest infection. Findings could be seen in the setting of angioedema. If the patient develops infectious symptoms, repeat contrast enhanced neck CT is recommended. 2. Nonspecific soft tissue stranding in the subcutaneous soft tissues overlying the lower right neck, in the region of the thyroid gland.  Findings were discussed with Dr. Alvino Chapel on 10/24/22 at 6:28 PM. Electronically Signed   By: Marin Roberts M.D.   On: 10/24/2022 18:28   DG Cervical Spine 1 View  Result Date: 10/13/2022 CLINICAL DATA:  Anterior cervical discectomy and fusion. EXAM: DG CERVICAL SPINE - 1 VIEW COMPARISON:  MR cervical spine 08/28/2022 and cervical spine series 07/20/2020. FINDINGS: 2 intraoperative fluoroscopic spot views of the cervical spine are provided in the lateral projection. Anterior plate is seen along C3, C4 and C5. Vertebral body screws are visualized at C3 and C4. Interbody spacer at C3-4. C5-6 and below are poorly visualized due to the patient's shoulders. IMPRESSION: Limited visualization of anterior cervical fusion involving the C3 through C5 levels, as detailed above. Electronically Signed   By: Lorin Picket M.D.   On: 10/13/2022 10:50   DG C-Arm 1-60 Min-No Report  Result Date: 10/13/2022 Fluoroscopy was utilized by the requesting physician.  No radiographic interpretation.   DG C-Arm 1-60 Min-No Report  Result Date: 10/13/2022 Fluoroscopy was utilized by the requesting physician.  No radiographic interpretation.   DG C-Arm 1-60 Min-No Report  Result Date: 10/13/2022 Fluoroscopy was utilized by the requesting physician.  No radiographic interpretation.    Microbiology: No results found for this or any previous visit (from the past 240 hour(s)).   Labs: Basic Metabolic Panel: Recent Labs  Lab 10/24/22 1633 10/25/22 0624 10/26/22 0401  NA 127* 129* 129*  K 4.2 4.4 4.5  CL 95* 97* 97*  CO2 21* 23 23  GLUCOSE 274* 321* 240*  BUN '19 16 11  '$ CREATININE 0.99 0.88 1.04  CALCIUM 8.6* 8.1* 8.0*  MG  --   --  1.8  PHOS  --   --  3.6   Liver Function Tests: Recent Labs  Lab 10/26/22 0401  AST 40  ALT 38  ALKPHOS 72  BILITOT 0.8  PROT 5.4*  ALBUMIN 2.8*   No results for input(s): "LIPASE", "AMYLASE" in the last 168 hours. No results for input(s): "AMMONIA" in the last 168  hours. CBC: Recent Labs  Lab 10/24/22 1633 10/25/22 [id number] 10/26/22 0401  WBC 11.8* 12.8* 11.0*  NEUTROABS 8.7*  --  8.8*  HGB 13.6 13.5 12.8*  HCT 39.7 39.0 36.1*  MCV 82.0 81.8 80.4  PLT 250 244 259   Cardiac Enzymes: No results for input(s): "CKTOTAL", "CKMB", "CKMBINDEX", "TROPONINI" in the last 168 hours. BNP: BNP (last 3 results) No results for input(s): "BNP" in the last 8760 hours.  ProBNP (last 3 results) No results for input(s): "PROBNP" in the last 8760 hours.  CBG: Recent Labs  Lab 10/26/22 1129  GLUCAP 214*       Signed:  Fayrene Helper MD.  [facility] 10/26/2022, 2:45 PM

## 2022-10-27 ENCOUNTER — Other Ambulatory Visit: Payer: Self-pay

## 2022-10-27 ENCOUNTER — Telehealth: Payer: Self-pay | Admitting: Internal Medicine

## 2022-10-27 ENCOUNTER — Telehealth: Payer: Self-pay

## 2022-10-27 DIAGNOSIS — L509 Urticaria, unspecified: Secondary | ICD-10-CM

## 2022-10-27 NOTE — Telephone Encounter (Signed)
Pt dc 2/25 Toc appt made  Toc call needed

## 2022-10-27 NOTE — Transitions of Care (Post Inpatient/ED Visit) (Signed)
   10/27/2022  Name: Christopher [name] MRN: [id number] DOB: 09-Apr-1956  Today's TOC FU Call Status: Today's TOC FU Call Status:: Successful TOC FU Call Competed TOC FU Call Complete Date: 10/27/22  Transition Care Management Follow-up Telephone Call Date of Discharge: 10/26/22 Discharge Facility: [facility]) Type of Discharge: Inpatient Admission Primary Inpatient Discharge Diagnosis:: allergy How have you been since you were released from the hospital?: Same Any questions or concerns?: No  Items Reviewed: Did you receive and understand the discharge instructions provided?: Yes Medications obtained and verified?: Yes (Medications Reviewed) Any new allergies since your discharge?: No Dietary orders reviewed?: NA Do you have support at home?: Yes People in Home: spouse  Home Care and Equipment/Supplies: [place] Ordered?: NA Any new equipment or medical supplies ordered?: NA  Functional Questionnaire: Do you need assistance with bathing/showering or dressing?: No Do you need assistance with meal preparation?: No Do you need assistance with eating?: No Do you have difficulty maintaining continence: No Do you need assistance with getting out of bed/getting out of a chair/moving?: No Do you have difficulty managing or taking your medications?: No  Folllow up appointments reviewed: PCP Follow-up appointment confirmed?: No (no avail appt times, sent staff message to schedule) MD Provider Line Number:[phone] Given: No [facility] Follow-up appointment confirmed?: Yes Date of Specialist follow-up appointment?: 11/10/22 Follow-Up Specialty Provider:: Dr Posey Pronto Do you need transportation to your follow-up appointment?: No Do you understand care options if your condition(s) worsen?: Yes-patient verbalized understanding    [place], [place] Nurse Health Advisor Direct Dial [phone]

## 2022-10-28 NOTE — Telephone Encounter (Signed)
Toc completed

## 2022-10-29 ENCOUNTER — Encounter: Payer: Self-pay | Admitting: Internal Medicine

## 2022-10-29 ENCOUNTER — Ambulatory Visit (INDEPENDENT_AMBULATORY_CARE_PROVIDER_SITE_OTHER): Payer: PPO | Admitting: Internal Medicine

## 2022-10-29 VITALS — BP 145/80 | HR 94 | Ht 66.0 in | Wt 236.0 lb

## 2022-10-29 DIAGNOSIS — T783XXD Angioneurotic edema, subsequent encounter: Secondary | ICD-10-CM

## 2022-10-29 DIAGNOSIS — L509 Urticaria, unspecified: Secondary | ICD-10-CM

## 2022-10-29 MED ORDER — HYDROXYZINE HCL 50 MG PO TABS: 50.0000 mg | ORAL_TABLET | Freq: Four times a day (QID) | ORAL | 1 refills | Status: AC | PRN

## 2022-10-29 NOTE — Progress Notes (Addendum)
   HPI:[name] is a 67 y.o. male who presents for hospital follow up.  Patient underwent anterior cervical discectomies and fusion of C3-4 and C4-5 and discharged 2/13.  He was discharged with methyl prednisone Dosepak.  Presented to primary care office on 2/21 with hives.  Allergies were reviewed and patient had previous allergy to cortisone and prednisone, but reported he had tolerated Medrol Dosepak.  His EpiPen was refilled and started on hydroxyzine 25 mg.  Patient Indipam to go to the emergency room on 2/23 where CT soft tissue neck with contrast showed soft tissue swelling and nearing of airway consistent with angioedema.  He received 1 dose of epinephrine .3 mg and patient discharged from hospital on Claritin, Pepcid and Atarax.  He also had EpiPen.  He is continue to have hives and itching.  He felt like his throat was closing up last night and used EpiPen.  He has appointment with allergy and immunology March 11.  He has had 1 other episode similar to this after a cervical neck surgery 8 years ago.  No other new medications.  He has been on opioid pain medications in the past.  Physical Exam: Vitals:   10/29/22 1603  BP: (!) 145/80  Pulse: 94  SpO2: 93%  Weight: 236 lb (107 kg)  Height: 5' 6"$  (1.676 m)     Physical Exam Constitutional:      Appearance: He is obese.     Comments: Wearing foam cervical collar, scratching side and stomach, appears uncomfortable   Eyes:     Comments: Swelling under both eyes right greater than left  Cardiovascular:     Rate and Rhythm: Normal rate and regular rhythm.     Heart sounds: No murmur heard. Pulmonary:     Effort: Pulmonary effort is normal. No respiratory distress.     Breath sounds: No wheezing or rales.  Skin:    Findings: Rash (pruritic erythematous papules generalized pattern) present.      Assessment & Plan:   Serafim was seen today for follow-up.  Hives -     hydrOXYzine HCl; Take 1 tablet (50 mg total) by  mouth every 6 (six) hours as needed for itching or anxiety.  Dispense: 30 tablet; Refill: 1  Angioedema, subsequent encounter Assessment & Plan: Patient continues hives and episode of worsening angioedema last night.  He has no feeling of throat closing or difficulty breathing on my exam.  We will increase hydroxyzine patient is aware medications may make him sleepy.  He is not driving since his cervical surgery. Continue Claritin .  I will reach out to allergy and immunology and see if there is any availability before March 11.  Will check CBC, BMP, CRP, sed rate, C4 complement.  Orders: -     BMP8+EGFR -     C4 complement -     CBC With Differential -     C-reactive protein -     Sedimentation rate      Lorene Dy, MD

## 2022-10-29 NOTE — Assessment & Plan Note (Addendum)
Patient continues hives and episode of worsening angioedema last night.  He has no feeling of throat closing or difficulty breathing on my exam.  We will increase hydroxyzine patient is aware medications may make him sleepy.  He is not driving since his cervical surgery. Continue Claritin .  I will reach out to allergy and immunology and see if there is any availability before March 11.  Will check CBC, BMP, CRP, sed rate, C4 complement.

## 2022-10-29 NOTE — Patient Instructions (Addendum)
Thank you for trusting me with your care. To recap, today we discussed the following:  Go by the lab for blood work   I will check on urgent referral to allergy and immunology  Increase your dose of hydroxyzine to 50 mg every 6 hours  Follow up if symptoms worsen or fail to improve

## 2022-10-30 ENCOUNTER — Encounter: Payer: Self-pay | Admitting: Internal Medicine

## 2022-11-03 ENCOUNTER — Ambulatory Visit (INDEPENDENT_AMBULATORY_CARE_PROVIDER_SITE_OTHER): Payer: PPO | Admitting: Internal Medicine

## 2022-11-03 ENCOUNTER — Other Ambulatory Visit: Payer: Self-pay

## 2022-11-03 ENCOUNTER — Encounter: Payer: Self-pay | Admitting: Internal Medicine

## 2022-11-03 VITALS — BP 142/76 | HR 92 | Temp 99.5°F | Resp 16 | Ht 65.5 in | Wt 237.2 lb

## 2022-11-03 VITALS — BP 136/72 | HR 76 | Resp 16 | Ht 66.0 in | Wt 237.1 lb

## 2022-11-03 DIAGNOSIS — Z720 Tobacco use: Secondary | ICD-10-CM

## 2022-11-03 DIAGNOSIS — L501 Idiopathic urticaria: Secondary | ICD-10-CM | POA: Diagnosis not present

## 2022-11-03 DIAGNOSIS — L509 Urticaria, unspecified: Secondary | ICD-10-CM

## 2022-11-03 MED ORDER — FEXOFENADINE HCL 180 MG PO TABS: 180.0000 mg | ORAL_TABLET | Freq: Two times a day (BID) | ORAL | 5 refills | Status: DC

## 2022-11-03 MED ORDER — FAMOTIDINE 20 MG PO TABS: 20.0000 mg | ORAL_TABLET | Freq: Two times a day (BID) | ORAL | 0 refills | Status: DC

## 2022-11-03 MED ORDER — NICOTINE 7 MG/24HR TD PT24: 7.0000 mg | MEDICATED_PATCH | Freq: Every day | TRANSDERMAL | 0 refills | Status: DC

## 2022-11-03 NOTE — Patient Instructions (Signed)
Thank you for trusting me with your care. To recap, today we discussed the following:   Your swelling should improve with activity and with improvement of your stress reaction  Prescription sent to your pharmacy. - nicotine (NICODERM CQ - DOSED IN MG/24 HR) 7 mg/24hr patch; Place 1 patch (7 mg total) onto the skin daily.  Dispense: 14 patch; Refill: 0

## 2022-11-03 NOTE — Progress Notes (Signed)
   HPI:[name] is a 67 y.o. male who presents for evaluation of tobacco use disorder and swelling in his hand feet.  For the details of today's visit, please refer to the assessment and plan.  Physical Exam: Vitals:   11/03/22 1547  BP: 136/72  Pulse: 76  Resp: 16  SpO2: 92%  Weight: 237 lb 1.9 oz (107.6 kg)  Height: '5\' 6"'$  (1.676 m)     Physical Exam Constitutional:      Comments: Patient is scratching, scratched area on back of calf and caused minor bleeding  Cardiovascular:     Rate and Rhythm: Normal rate and regular rhythm.  Pulmonary:     Effort: Pulmonary effort is normal.     Breath sounds: No wheezing or rales.  Musculoskeletal:     Right lower leg: Edema (1+) present.     Left lower leg: Edema (1+) present.  Skin:    General: Skin is dry.     Comments: Erythematous plaques with central clearing, defined edges on thighs      Assessment & Plan:   Charlene was seen today for edema.  Current tobacco use Assessment & Plan: Patient has cut back from 1 pack of cigarettes per day down to cigarettes per day.  He did this with the help of nicotine patches and is now out of patches. He would like to continue stepdown therapy with nicotine patches.  Plan: - nicotine (NICODERM CQ - DOSED IN MG/24 HR) 7 mg/24hr patch; Place 1 patch (7 mg total) onto the skin daily.  Dispense: 14 patch; Refill: 0   Orders: -     Nicotine; Place 1 patch (7 mg total) onto the skin daily.  Dispense: 14 patch; Refill: 0  Urticaria Assessment & Plan: Patient's symptoms have improved since his office visit last week.  He saw allergy and immunology today.  Appreciate their scheduling him quickly and treatment recommendations.  He has lower extremity swelling and some swelling in his bilateral hands. He continues to have some urticaria.  I suspect this swelling is related to his reaction.  He will continue to monitor and follow up if not resolving.        Lorene Dy, MD

## 2022-11-03 NOTE — Progress Notes (Signed)
NEW PATIENT  Date of Service/Encounter:  11/03/22  Consult requested by: Johnette Abraham, MD   Subjective:   Christopher [name] (DOB: [birth year]) is a 67 y.o. male who presents to the clinic on 11/03/2022 with a chief complaint of Urticaria (States that after surgery he has developed hives after using Methylprednisolone pack. He states this occurred eight years ago with a previous surgery as well. ) .    History obtained from: chart review and patient and wife .   Reports undergoing anterior cervical disectomy and fusion on 10/13/2022 and was discharged home with medrol dose pack.  He has tolerated this in the past but reports having swelling with cortisone and prednisone (can't recall the exact reaction or when).  He had about 5 days of this and recalls that after finishing the pack, he started developing hives all over followed by swelling of hands/feet. He had seen his PCP on 10/22/2022 for urticaria and was told to use hydroxyzine as needed for urticaria.  On 10/24/2022, he went to the ER due to hives and trouble swallowing and CT at presentation with nonspecific soft tissue edema in tongue, uvula, level of aryepiglottic folds, and at the level of the glottis with narrowing of the airway.  Was given epinephrine but then had recurrence of hives and was admitted.   Discharged on 10/26/2021 after observation.  Started on Claritin/Pepcid BID and Hydroxyzine PRN with Allergy referral.   Followed up with PCP on 10/29/2022 Dr. Court Joy and reported still having hives, itching and throat closing requiring another round of Epi. Hives noted on exam and swelling under eyes. Given hydroxyzine to use PRN.  Also checking labs with ESR/CRP/C4.  Hives are itchy, raised and red.  No scarring but he is scratching so much that he has opened them up and has lots of excoriations.   Reports prior history of something similar after a surgery 1/20214 that lasted for 1 week with hives and swelling and then stopped on its own.   Chart review shows he was seen in ER 09/23/2012 for hives after undergoing back surgery for herniated disc and also then developed swelling of hands.  Hives on exam.  Given IV pepcid and vistaril with improvement. Hives persisted despite that so he was admitted with PRN benadryl. At the time, he had a listed allergy for prednisone.  Discussed to use PRN benadryl at home.  Denies any other episodes of hives/swelling outside of this.   Currently taking Claritin, Pepcid, Hydroxyzine PRN.    Past Medical History: Past Medical History:  Diagnosis Date   Anxiety    Arthritis    Asthma    BPH (benign prostatic hyperplasia)    Complication of anesthesia    pt had a hard time being able to move after spinal anesthesia , 3-4 hours   Depression    GERD (gastroesophageal reflux disease)    Gout    no meds   Headache(784.0)    otc meds prn   Heart murmur    dx as a child, no problems as an adult   History of hiatal hernia    Hyperlipidemia    IBS (irritable bowel syndrome)    PONV (postoperative nausea and vomiting)    Pre-diabetes    Borderline, diet and exercise, no med   Sleep apnea    uses CIPAP machine at night   Wears partial dentures    bottom partial    Past Surgical History: Past Surgical History:  Procedure Laterality Date  ANTERIOR CERVICAL DECOMP/DISCECTOMY FUSION N/A 10/13/2022   Procedure: ANTERIOR CERVICAL DISECTOMY FUSION - CERVICAL THREE-CERVICAL FOUR - CERVICAL FOUR-CERVICAL FIVE REMOVAL OF HARDWARE CERVICAL FIVE-CERVICAL SIX;  Surgeon: Kary Kos, MD;  Location: [facility];  Service: Neurosurgery;  Laterality: N/A;   APPENDECTOMY     BACK SURGERY  2002   neck and back fusion   BIOPSY  12/27/2015   Procedure: BIOPSY;  Surgeon: Rogene Houston, MD;  [facility];  Service: Endoscopy;;  Fundus biopsies and duodenal biopsies   BIOPSY  02/10/2020   Procedure: BIOPSY;  Surgeon: Rogene Houston, MD;  [facility];  Service: Endoscopy;;  antral   CARDIAC  CATHETERIZATION     CARDIAC CATHETERIZATION N/A 09/04/2016   Procedure: Right/Left Heart Cath and Coronary Angiography;  Surgeon: Peter M Martinique, MD;  Location: [facility];  Service: Cardiovascular;  Laterality: N/A;   CHOLECYSTECTOMY     CHONDROPLASTY  08/15/2011   Procedure: CHONDROPLASTY;  Surgeon: [name], MD;  [facility];  Service: Orthopedics;  Laterality: Left;   COLONOSCOPY  06/27/2011   Procedure: COLONOSCOPY;  Surgeon: Rogene Houston, MD;  [facility];  Service: Endoscopy;  Laterality: N/A;  9:00 / Pt to be here at 9am for 10:45 procedure, benign polyps removed   COLONOSCOPY N/A 10/05/2014   Procedure: COLONOSCOPY;  Surgeon: Rogene Houston, MD;  [facility];  Service: Endoscopy;  Laterality: N/A;  930   COLONOSCOPY N/A 02/11/2018   Procedure: COLONOSCOPY;  Surgeon: Rogene Houston, MD;  [facility];  Service: Endoscopy;  Laterality: N/A;  830   ESOPHAGOGASTRODUODENOSCOPY N/A 12/27/2015   Procedure: ESOPHAGOGASTRODUODENOSCOPY (EGD);  Surgeon: Rogene Houston, MD;  [facility];  Service: Endoscopy;  Laterality: N/A;  3:00   ESOPHAGOGASTRODUODENOSCOPY (EGD) WITH PROPOFOL N/A 02/10/2020   Procedure: ESOPHAGOGASTRODUODENOSCOPY (EGD) WITH PROPOFOL;  Surgeon: Rogene Houston, MD;  [facility];  Service: Endoscopy;  Laterality: N/A;  155   HERNIA REPAIR  [id number]   umbilical hernia   JOINT REPLACEMENT  2023   knee and shoulder   KNEE ARTHROSCOPY     left knee   KNEE ARTHROSCOPY     right knee    LUMBAR LAMINECTOMY/DECOMPRESSION MICRODISCECTOMY  09/14/2012   Procedure: LUMBAR LAMINECTOMY/DECOMPRESSION MICRODISCECTOMY 1 LEVEL;  Surgeon: Floyce Stakes, MD;  Location: [facility];  Service: Neurosurgery;  Laterality: Right;  Right Lumbar three-four Diskectomy   neck fusion  2005   NECK SURGERY     PATELLA-FEMORAL ARTHROPLASTY Right 02/18/2022   Procedure: RIGHT KNEE PATELLA REPLACEMENT, CEMENTED;   Surgeon: Meredith Pel, MD;  Location: [facility];  Service: Orthopedics;  Laterality: Right;   POLYPECTOMY  02/11/2018   Procedure: POLYPECTOMY;  Surgeon: Rogene Houston, MD;  [facility];  Service: Endoscopy;;  colon   REVISION TOTAL SHOULDER TO REVERSE TOTAL SHOULDER Left 12/16/2018   Procedure: REVISION TOTAL SHOULDER TO REVERSE TOTAL SHOULDER;  Surgeon: Meredith Pel, MD;  Location: [facility];  Service: Orthopedics;  Laterality: Left;   SHOULDER ARTHROSCOPY WITH BICEPSTENOTOMY Left 09/23/2013   Procedure: SHOULDER ARTHROSCOPY WITH BICEPSTENOTOMY AND EXTENSIVE DEBRIDEMENT;  Surgeon: Carole Civil, MD;  [facility];  Service: Orthopedics;  Laterality: Left;   SHOULDER ARTHROSCOPY WITH ROTATOR CUFF REPAIR Left 05/05/2014   Procedure: SHOULDER ARTHROSCOPY LIMITED DEBRIDEMENT;  Surgeon: Carole Civil, MD;  [facility];  Service: Orthopedics;  Laterality: Left;   SHOULDER OPEN ROTATOR CUFF REPAIR  Left 05/05/2014   Procedure: ROTATOR CUFF REPAIR SHOULDER OPEN;  Surgeon: Carole Civil, MD;  [facility];  Service: Orthopedics;  Laterality: Left;   SHOULDER OPEN ROTATOR CUFF REPAIR Left 12/16/2018   Procedure: LEFT SHOULDER POSSIBLE SUBSCAPULARIS REPAIR VS. REVISION TO REVERSE TOTAL SHOULDER REPLACEMENT;  Surgeon: Meredith Pel, MD;  Location: [facility];  Service: Orthopedics;  Laterality: Left;   SHOULDER SURGERY Right    Open [place] procedure   SPINAL FUSION     x2, 2003 and 2007   TOTAL KNEE ARTHROPLASTY Left 06/16/2017   Procedure: LEFT TOTAL KNEE ARTHROPLASTY;  Surgeon: Carole Civil, MD;  [facility];  Service: Orthopedics;  Laterality: Left;   TOTAL KNEE ARTHROPLASTY Right 05/14/2021   Procedure: TOTAL KNEE ARTHROPLASTY;  Surgeon: Carole Civil, MD;  [facility];  Service: Orthopedics;  Laterality: Right;   TOTAL SHOULDER ARTHROPLASTY Left 08/19/2018   Procedure: left shoulder replacement;  Surgeon: Meredith Pel,  MD;  Location: [facility];  Service: Orthopedics;  Laterality: Left;    Family History: Family History  Problem Relation Age of Onset   Diabetes Mother    Alzheimer's disease Father    Diabetes Father    Diabetes Other    Lung disease Other    Arthritis Other    Anesthesia problems Neg Hx    Hypotension Neg Hx    Malignant hyperthermia Neg Hx    Pseudochol deficiency Neg Hx    Sleep apnea Neg Hx     Social History:  Lives in a 28 year trailer Flooring in bedroom:  vinyl Pets: dog Tobacco use/exposure: 48 pack years, quit 3 months ago Job: retired  Medication List:  Allergies as of 11/03/2022       Reactions   Celebrex [celecoxib] Itching, Swelling   All over   Codeine Nausea And Vomiting, Other (See Comments)   Extreme stomach pain. This includes anything with the derivative of codeine in it. (Does tolerate hydrocodone)    Cortisone Swelling   SWELLING REACTION UNSPECIFIED    Doxycycline Swelling, Other (See Comments)   Made tongue turn black    Medrol [methylprednisolone] Hives, Swelling   Angioedema   Prednisone Swelling   SWELLING REACTION UNSPECIFIED    Relafen [nabumetone] Swelling   SWELLING REACTION UNSPECIFIED    Aspirin Other (See Comments)   Stomach cramps   Oxycodone-acetaminophen Itching, Other (See Comments)   Can tolerate with benadryl         Medication List        Accurate as of November 03, 2022  4:46 PM. If you have any questions, ask your nurse or doctor.          STOP taking these medications    acetaminophen 500 MG tablet Commonly known as: TYLENOL Stopped by: Larose Kells, MD   [place] by: Larose Kells, MD       TAKE these medications    cyclobenzaprine 10 MG tablet Commonly known as: FLEXERIL Take 1 tablet (10 mg total) by mouth 3 (three) times daily as needed for muscle spasms. Do not take with Robaxin What changed: Another medication with the same name was removed. Continue taking this medication, and follow the  directions you see here. Changed by: Larose Kells, MD   EPINEPHrine 0.3 mg/0.3 mL Soaj injection Commonly known as: EPI-PEN Inject 0.3 mg into the muscle as needed for anaphylaxis.   famotidine 20 MG tablet Commonly known as: PEPCID Take 1 tablet (20  mg total) by mouth 2 (two) times daily.   HYDROcodone-acetaminophen 5-325 MG tablet Commonly known as: NORCO/VICODIN Take 1 tablet by mouth every 4 (four) hours as needed for moderate pain.   hydrOXYzine 50 MG tablet Commonly known as: ATARAX Take 1 tablet (50 mg total) by mouth every 6 (six) hours as needed for itching or anxiety.   loratadine 10 MG tablet Commonly known as: Claritin Take 1 tablet (10 mg total) by mouth 2 (two) times daily. Follow up with your PCP or allergist to discuss this med further   nicotine 7 mg/24hr patch Commonly known as: NICODERM CQ - dosed in mg/24 hr Place 1 patch (7 mg total) onto the skin daily. Started by: Lorene Dy, MD   nitroGLYCERIN 0.4 MG SL tablet Commonly known as: NITROSTAT PLACE (1) TABLET UNDER TONGUE EVERY 5 MINUTES UP TO (3) DOSES. IF NO RELIEF CALL 911. What changed: See the new instructions.   pantoprazole 40 MG tablet Commonly known as: PROTONIX TAKE (1) TABLET TWICE A DAY BEFORE MEALS. What changed: See the new instructions.   ProAir HFA 108 (90 Base) MCG/ACT inhaler Generic drug: albuterol Inhale 2 puffs into the lungs every 6 (six) hours as needed for wheezing or shortness of breath.   rosuvastatin 20 MG tablet Commonly known as: Crestor Take 1 tablet (20 mg total) by mouth daily.   sertraline 50 MG tablet Commonly known as: ZOLOFT Take 50 mg by mouth in the morning.   Stiolto Respimat 2.5-2.5 MCG/ACT Aers Generic drug: Tiotropium Bromide-Olodaterol Inhale 2 puffs into the lungs daily.         REVIEW OF SYSTEMS: Pertinent positives and negatives discussed in HPI.   Objective:   Physical Exam: BP (!) 142/76   Pulse 92   Temp 99.5 F (37.5 C)  (Temporal)   Resp 16   Ht 5' 5.5" (1.664 m)   Wt 237 lb 3.2 oz (107.6 kg)   SpO2 97%   BMI 38.87 kg/m  Body mass index is 38.87 kg/m. GEN: alert, well developed HEENT: clear conjunctiva, TM grey and translucent, nose with + inferior turbinate hypertrophy, pale nasal mucosa, slight clear rhinorrhea, + cobblestoning HEART: regular rate and rhythm, no murmur LUNGS: clear to auscultation bilaterally, no coughing, unlabored respiration ABDOMEN: soft, non distended  SKIN: no rashes or lesions  Reviewed:  See HPI Reviewed multiple urgent care, specialist, ER visits and PCP visits.   Assessment:   1. Idiopathic urticaria     Plan/Recommendations:  Idiopathic Urticaria/Angioedema (Hives/Swelling): - At this time etiology of hives and swelling is unknown. Hives can be caused by a variety of different triggers including illness/infection, exercise, pressure, vibrations, extremes of temperature to name a few however majority of the time there is no identifiable trigger.   You did recently have surgery which could result in a stress response causing the hives/swelling.    - I am not sure if the medrol dose pack was the cause of hives/swelling since you are still having the symptoms despite stopping them.  Also your hives/swelling started after finishing the medrol dose pack which does not fit.  - If persistent, we will do urticaria labs.  - Okay to use Sarna lotion. Can place in refrigerator so it has a nice cooling effect when you apply it.  Also moisturize skin twice daily as dry skin itches using a cream such as Cerave, Cetaphil, Eucerin, Aveeno.  -Start Pepcid (Famotidine) '20mg'$  twice daily and Allegra (Fexofenadine) '180mg'$  twice daily. This replaces Claritin.  -If still  no improvement in 3-4 days, increase to Allegra '360mg'$  twice daily and Pepcid '40mg'$  twice daily. -Use hydroxyzine 25 to '50mg'$  nightly as needed for itching to help him sleep.   - Epipen is only for swelling with laryngeal  involvement so if you have trouble breathing and trouble swallowing.  Outside of this, do not use it for hives/other swelling.      Return in about 4 weeks (around 12/01/2022).  Harlon Flor, MD [facility]

## 2022-11-03 NOTE — Patient Instructions (Addendum)
Idiopathic Urticaria/Angioedema (Hives/Swelling): - At this time etiology of hives and swelling is unknown. Hives can be caused by a variety of different triggers including illness/infection, exercise, pressure, vibrations, extremes of temperature to name a few however majority of the time there is no identifiable trigger.   You did recently have surgery which could result in a stress response.   - I am not sure if the medrol dose pack was the cause of hives/swelling since you are still having the symptoms despite stopping them.  Also your hives/swelling started after finishing the medrol dose pack which does not fit.  - Okay to use Sarna lotion. Can place in refrigerator so it has a nice cooling effect when you apply it.  Also moisturize skin twice daily as dry skin itches using a cream such as Cerave, Cetaphil, Eucerin, Aveeno.  -Start Pepcid (Famotidine) '20mg'$  twice daily and Allegra (Fexofenadine) '180mg'$  twice daily. -If still no improvement in 3-4 days, increase to Allegra '360mg'$  twice daily and Pepcid '40mg'$  twice daily. -Use hydroxyzine 25 to '50mg'$  nightly as needed for itching to help him sleep.   - Epipen is only for swelling with laryngeal involvement so if you have trouble breathing and trouble swallowing.  Outside of this, do not use it for hives/other swelling.

## 2022-11-03 NOTE — Assessment & Plan Note (Addendum)
Patient has cut back from 1 pack of cigarettes per day down to cigarettes per day.  He did this with the help of nicotine patches and is now out of patches. He would like to continue stepdown therapy with nicotine patches.  Plan: - nicotine (NICODERM CQ - DOSED IN MG/24 HR) 7 mg/24hr patch; Place 1 patch (7 mg total) onto the skin daily.  Dispense: 14 patch; Refill: 0

## 2022-11-03 NOTE — Assessment & Plan Note (Signed)
Patient's symptoms have improved since his office visit last week.  He saw allergy and immunology today.  Appreciate their scheduling him quickly and treatment recommendations.  He has lower extremity swelling and some swelling in his bilateral hands. He continues to have some urticaria.  I suspect this swelling is related to his reaction.  He will continue to monitor and follow up if not resolving.

## 2022-11-04 ENCOUNTER — Ambulatory Visit: Payer: PPO | Admitting: Internal Medicine

## 2022-11-06 ENCOUNTER — Telehealth: Payer: Self-pay | Admitting: Internal Medicine

## 2022-11-06 NOTE — Telephone Encounter (Signed)
Called in regard to hydrOXYzine (ATARAX) 50 MG tablet  States that patient has 2 different kinds. Wants a call back in regard Lucianne Lei patient mix med which one is he supposed to take.

## 2022-11-06 NOTE — Telephone Encounter (Signed)
Tried calling patient and no answer.

## 2022-11-07 LAB — GLUCOSE, CAPILLARY: Glucose-Capillary: 232 mg/dL — ABNORMAL HIGH (ref 70–99)

## 2022-11-07 NOTE — Telephone Encounter (Signed)
Pt wife come in office and spoke with her about medication.

## 2022-11-10 ENCOUNTER — Ambulatory Visit: Payer: PPO | Admitting: Internal Medicine

## 2022-11-11 ENCOUNTER — Ambulatory Visit: Payer: PPO | Admitting: Urology

## 2022-11-12 ENCOUNTER — Ambulatory Visit (INDEPENDENT_AMBULATORY_CARE_PROVIDER_SITE_OTHER): Payer: PPO | Admitting: Family Medicine

## 2022-11-12 ENCOUNTER — Telehealth: Payer: Self-pay | Admitting: Allergy & Immunology

## 2022-11-12 ENCOUNTER — Other Ambulatory Visit: Payer: Self-pay

## 2022-11-12 ENCOUNTER — Encounter: Payer: Self-pay | Admitting: Family Medicine

## 2022-11-12 VITALS — BP 136/64 | HR 79 | Temp 98.7°F | Resp 18 | Wt 233.1 lb

## 2022-11-12 DIAGNOSIS — L501 Idiopathic urticaria: Secondary | ICD-10-CM | POA: Diagnosis not present

## 2022-11-12 DIAGNOSIS — Z87892 Personal history of anaphylaxis: Secondary | ICD-10-CM

## 2022-11-12 MED ORDER — OMALIZUMAB 150 MG ~~LOC~~ SOLR
300.0000 mg | SUBCUTANEOUS | Status: DC
  Administered 2022-11-12 – 2023-08-12 (×11): 300 mg via SUBCUTANEOUS

## 2022-11-12 MED ORDER — EPINEPHRINE 0.3 MG/0.3ML IJ SOAJ: 0.3000 mg | INTRAMUSCULAR | 2 refills | Status: DC | PRN

## 2022-11-12 NOTE — Telephone Encounter (Signed)
Patient's wife called. He is having hives despite the antihistamines. Wife has used the epinephrine injector twice in the last week.   Currently he is on Allegra twice daily and Pepcid twice daily and hydroxyzine as needed. Wife has been giving the hydroxyzine very regularly lately.   Reviewed the note. Dr. Posey Pronto did not do any testing at the visit. He has a f/u appt in one month. Reviewed the schedule today. I put him in to see Webb Silversmith today at 2pm.   Wife is from [place] but has been here for 30 years. She has a delightfully strong accent.

## 2022-11-12 NOTE — Progress Notes (Signed)
Christopher [name], [name] Dept: [phone]  FOLLOW UP NOTE  Patient ID: Christopher [name], male    DOB: 30-Aug-1956  Age: 67 y.o. MRN: [id number] Date of Office Visit: 11/12/2022  Assessment  Chief Complaint: Urticaria (Has used epi 2x times since he was last seen. Breaking out in hives. Last night he felt like his throat was closing which is why he used his Epipen. Needs another epipen but needs Doctors approval. )  HPI Christopher [name] is a 67 year old male who presents to the clinic for an evaluation of hives. He was last seen in this clinic on 11/03/2022 by Dr. Posey Pronto for evaluation of urticaria.  He reports that he had an anterior cervical discectomy and fusion on 10/13/2022 and was discharged home with an oral steroid.  He reports that after finishing the steroid he developed hives and swelling of his hands and feet.  He was seen by his PCP on 10/22/2022 and was prescribed hydroxyzine.  Chart review indicates that he went to the emergency department on 10/24/2022 for difficulty swallowing.  He was not experiencing any shortness of breath or wheezing at that time.  He did have a CT scan indicating some angioedema of the glottis and received 3 doses of epinephrine which reduced throat irritation, difficulty swallowing, and hives.  The report indicates that the symptoms returned within a few hours and the patient was transferred to [facility].  Chart review indicates that he has a history of swelling after steroid as well as family history of swelling after steroid.  He is accompanied by his wife who assists with history.  At today's visit, he reports that he continues to experience intermittent hives that occur, resolve, and occur in a different area.  He continues to experience intermittent throat closing for which he has used epinephrine several times with relief of symptoms.  He reports that he used his last epinephrine autoinjector last night for throat closing which  resolved to 20 minutes after epinephrine use.  He continues Allegra 180 mg twice a day and famotidine 20 mg twice a day.  He continues hydroxyzine 1 or 2 tablets at nighttime only if needed.  He denies shortness of breath, cough, or wheeze or gastrointestinal symptoms with these hives.  He denies any previous hives, however, does report that he did have hives after a surgery about 8 years ago after specific questions regarding that particular surgery and history of hives.  He denies new medications, personal care products, foods, or insect stings.  His current medications are listed in the chart.   Drug Allergies:  Allergies  Allergen Reactions   Celebrex [Celecoxib] Itching and Swelling    All over   Codeine Nausea And Vomiting and Other (See Comments)    Extreme stomach pain. This includes anything with the derivative of codeine in it. (Does tolerate hydrocodone)    Cortisone Swelling    SWELLING REACTION UNSPECIFIED    Doxycycline Swelling and Other (See Comments)    Made tongue turn black    Medrol [Methylprednisolone] Hives and Swelling    Angioedema    Prednisone Swelling    SWELLING REACTION UNSPECIFIED    Relafen [Nabumetone] Swelling    SWELLING REACTION UNSPECIFIED    Aspirin Other (See Comments)    Stomach cramps   Oxycodone-Acetaminophen Itching and Other (See Comments)    Can tolerate with benadryl     Physical Exam: BP 136/64   Pulse 79   Temp 98.7  F (37.1 C)   Resp 18   Wt 233 lb 2 oz (105.7 kg)   SpO2 95%   BMI 37.63 kg/m    Physical Exam Vitals reviewed.  Constitutional:      Appearance: Normal appearance.  HENT:     Head: Normocephalic and atraumatic.     Right Ear: Tympanic membrane normal.     Left Ear: Tympanic membrane normal.     Nose:     Comments: Bilateral nares slightly erythematous with clear nasal drainage noted.  Pharynx normal with no angioedema noted.  Ears normal.  Eyes normal.    Mouth/Throat:     Pharynx: Oropharynx is clear.   Eyes:     Conjunctiva/sclera: Conjunctivae normal.  Cardiovascular:     Rate and Rhythm: Normal rate and regular rhythm.     Heart sounds: Normal heart sounds. No murmur heard. Pulmonary:     Effort: Pulmonary effort is normal.     Breath sounds: Normal breath sounds.     Comments: Lungs clear to auscultation Musculoskeletal:        General: Normal range of motion.     Cervical back: Normal range of motion and neck supple.  Skin:    General: Skin is warm and dry.     Comments: Scattered well-circumscribed raised, red, hives noted.  No open areas or drainage noted.  Neurological:     Mental Status: He is alert and oriented to person, place, and time.  Psychiatric:        Mood and Affect: Mood normal.        Behavior: Behavior normal.        Thought Content: Thought content normal.        Judgment: Judgment normal.     Assessment and Plan: 1. Idiopathic urticaria   2. History of anaphylaxis     Meds ordered this encounter  Medications   omalizumab [name]) injection 300 mg   EPINEPHrine 0.3 mg/0.3 mL IJ SOAJ injection    Sig: Inject 0.3 mg into the muscle as needed for anaphylaxis.    Dispense:  1 each    Refill:  2    Patient Instructions  Idiopathic Urticaria/Angioedema (Hives/Swelling): - At this time etiology of hives and swelling is unknown. Hives can be caused by a variety of different triggers including illness/infection, exercise, pressure, vibrations, extremes of temperature to name a few however majority of the time there is no identifiable trigger.   You did recently have surgery which could result in a stress response.   Begin Allegra 360 mg twice a day Begin famotidine 40 mg twice a day Continue hydroxyzine 50 mg once at bedtime - Okay to use Sarna lotion. Can place in refrigerator so it has a nice cooling effect when you apply it.  Also moisturize skin twice daily as dry skin itches using a cream such as Cerave, Cetaphil, Eucerin, Aveeno.  - Epipen is only  for swelling with laryngeal involvement so if you have trouble breathing and trouble swallowing.  Outside of this, do not use it for hives/other swelling.   You received Xolair 300 mg in the clinic today. We will submit your information to our biologics coordinator, [place]. You will nest hear from [place] about next steps with Xolair We have ordered some lab work to help [place] evaluate your hives. We will call you when the results become available If your symptoms re-occur, begin a journal of events that occurred for up to 6 hours before your symptoms began including  foods and beverages consumed, soaps or perfumes you had contact with, and medications.   Call the clinic if this treatment plan is not working well for you  Follow up in 2 weeks or sooner if needed.   Return in about 2 weeks (around 11/26/2022), or if symptoms worsen or fail to improve.    Thank you for the opportunity to care for this patient.  Please do not hesitate to contact me with questions.  Gareth Morgan, FNP [facility] [place]

## 2022-11-12 NOTE — Patient Instructions (Incomplete)
Idiopathic Urticaria/Angioedema (Hives/Swelling): - At this time etiology of hives and swelling is unknown. Hives can be caused by a variety of different triggers including illness/infection, exercise, pressure, vibrations, extremes of temperature to name a few however majority of the time there is no identifiable trigger.   You did recently have surgery which could result in a stress response.   Begin Allegra 360 mg twice a day Begin famotidine 40 mg twice a day Continue hydroxyzine 50 mg once at bedtime - Okay to use Sarna lotion. Can place in refrigerator so it has a nice cooling effect when you apply it.  Also moisturize skin twice daily as dry skin itches using a cream such as Cerave, Cetaphil, Eucerin, Aveeno.  - Epipen is only for swelling with laryngeal involvement so if you have trouble breathing and trouble swallowing.  Outside of this, do not use it for hives/other swelling.   You received Xolair 300 mg in the clinic today. We will submit your information to our biologics coordinator, Browns. You will nest hear from [place] about next steps with Xolair We have ordered some lab work to help [place] evaluate your hives. We will call you when the results become available If your symptoms re-occur, begin a journal of events that occurred for up to 6 hours before your symptoms began including foods and beverages consumed, soaps or perfumes you had contact with, and medications.   Call the clinic if this treatment plan is not working well for you  Follow up in 2 weeks or sooner if needed.

## 2022-11-13 ENCOUNTER — Encounter: Payer: Self-pay | Admitting: Family Medicine

## 2022-11-13 DIAGNOSIS — Z87892 Personal history of anaphylaxis: Secondary | ICD-10-CM | POA: Insufficient documentation

## 2022-11-13 LAB — BMP8+EGFR
BUN/Creatinine Ratio: 13 (ref 10–24)
BUN: 11 mg/dL (ref 8–27)
CO2: 23 mmol/L (ref 20–29)
Calcium: 9.2 mg/dL (ref 8.6–10.2)
Chloride: 104 mmol/L (ref 96–106)
Creatinine, Ser: 0.88 mg/dL (ref 0.76–1.27)
Glucose: 102 mg/dL — ABNORMAL HIGH (ref 70–99)
Potassium: 4.2 mmol/L (ref 3.5–5.2)
Sodium: 143 mmol/L (ref 134–144)
eGFR: 96 mL/min/{1.73_m2} (ref >59)

## 2022-11-13 LAB — CBC WITH DIFFERENTIAL
Basophils Absolute: 0 10*3/uL (ref 0.0–0.2)
Basos: 0 %
EOS (ABSOLUTE): 0 10*3/uL (ref 0.0–0.4)
Eos: 0 %
Hematocrit: 36.7 % — ABNORMAL LOW (ref 37.5–51.0)
Hemoglobin: 12 g/dL — ABNORMAL LOW (ref 13.0–17.7)
Immature Grans (Abs): 0 10*3/uL (ref 0.0–0.1)
Immature Granulocytes: 0 %
Lymphocytes Absolute: 2 10*3/uL (ref 0.7–3.1)
Lymphs: 36 %
MCH: 27.4 pg (ref 26.6–33.0)
MCHC: 32.7 g/dL (ref 31.5–35.7)
MCV: 84 fL (ref 79–97)
Monocytes Absolute: 0.3 10*3/uL (ref 0.1–0.9)
Monocytes: 5 %
Neutrophils Absolute: 3.4 10*3/uL (ref 1.4–7.0)
Neutrophils: 59 %
RBC: 4.38 x10E6/uL (ref 4.14–5.80)
RDW: 14.2 % (ref 11.6–15.4)
WBC: 5.7 10*3/uL (ref 3.4–10.8)

## 2022-11-13 LAB — SEDIMENTATION RATE: Sed Rate: 11 mm/hr (ref 0–30)

## 2022-11-13 LAB — C-REACTIVE PROTEIN: CRP: 28 mg/L — ABNORMAL HIGH (ref 0–10)

## 2022-11-13 LAB — C4 COMPLEMENT: Complement C4, Serum: 31 mg/dL (ref 12–38)

## 2022-11-13 NOTE — Telephone Encounter (Signed)
Called and left a voicemail asking for patient to return call.  °

## 2022-11-13 NOTE — Telephone Encounter (Signed)
-----   Message from Dara Hoyer, FNP sent at 11/13/2022 11:39 AM EDT ----- Can you please call this patient and check on his hives and airway? Did they get the EpiPen's? Thank you

## 2022-11-14 ENCOUNTER — Telehealth: Payer: Self-pay | Admitting: *Deleted

## 2022-11-14 NOTE — Telephone Encounter (Signed)
Thank you Tammy!

## 2022-11-14 NOTE — Telephone Encounter (Signed)
Attempted to call patient, left a voicemail asking for a return call to see how he is doing. Attempted to call the wife's number whom is on the [facility] and her number was not in service.

## 2022-11-14 NOTE — Telephone Encounter (Signed)
Thank you for the update!

## 2022-11-14 NOTE — Telephone Encounter (Signed)
-----   Message from Dara Hoyer, FNP sent at 11/12/2022  5:01 PM EDT ----- Hi there Christopher [name] Can you please submit this patient for Xolair for urticaria? I gave him a sample in the [place] [facility] clinic today. Thank you

## 2022-11-14 NOTE — Telephone Encounter (Signed)
Called patient and spoke to wife advised patient assistance program for Kathreen Devoid and wil mail consent to return to me and will be in touch regarding status

## 2022-11-18 ENCOUNTER — Ambulatory Visit: Payer: PPO | Admitting: Neurology

## 2022-11-18 ENCOUNTER — Ambulatory Visit (INDEPENDENT_AMBULATORY_CARE_PROVIDER_SITE_OTHER): Payer: PPO | Admitting: Urology

## 2022-11-18 VITALS — BP 150/80 | HR 83

## 2022-11-18 DIAGNOSIS — N138 Other obstructive and reflux uropathy: Secondary | ICD-10-CM

## 2022-11-18 DIAGNOSIS — N401 Enlarged prostate with lower urinary tract symptoms: Secondary | ICD-10-CM

## 2022-11-18 DIAGNOSIS — R350 Frequency of micturition: Secondary | ICD-10-CM | POA: Diagnosis not present

## 2022-11-18 DIAGNOSIS — E291 Testicular hypofunction: Secondary | ICD-10-CM | POA: Diagnosis not present

## 2022-11-18 LAB — ALLERGENS, ZONE 2

## 2022-11-18 LAB — ALPHA-GAL PANEL
Allergen Lamb IgE: <0.1 kU/L
Beef IgE: <0.1 kU/L
IgE (Immunoglobulin E), Serum: 187 IU/mL (ref 6–495)
O215-IgE Alpha-Gal: <0.1 kU/L
Pork IgE: 0.13 kU/L — AB

## 2022-11-18 LAB — THYROGLOBULIN ANTIBODY: Thyroglobulin Antibody: <1 IU/mL (ref 0.0–0.9)

## 2022-11-18 LAB — TRYPTASE: Tryptase: 8.7 ug/L (ref 2.2–13.2)

## 2022-11-18 LAB — CHRONIC URTICARIA: cu index: <3.1 (ref <10)

## 2022-11-18 LAB — THYROID PEROXIDASE ANTIBODY: Thyroperoxidase Ab SerPl-aCnc: <9 IU/mL (ref 0–34)

## 2022-11-18 MED ORDER — TESTOSTERONE 20.25 MG/1.25GM (1.62%) TD GEL: 2.0000 | Freq: Every day | TRANSDERMAL | 4 refills | Status: DC

## 2022-11-18 NOTE — Progress Notes (Signed)
11/18/2022 4:02 PM   Christopher [name] 1956/06/06 [id number]  Referring provider: Johnette Abraham, MD [address],  [place] [address]  Followup BPH and hypogonadism  HPI: [name] is a 67yo here for followup for hypogonadism and BPH. He continue to have fatigue. He is not currently on testosterone replacement therapy. He has decreased libido and difficulty getting and maintaining an erection. IPSS 13 QOL 2 on no therapy. Uirne frequency has improved since last visit to every 2-3 hours.    PMH: Past Medical History:  Diagnosis Date   Anxiety    Arthritis    Asthma    BPH (benign prostatic hyperplasia)    Complication of anesthesia    pt had a hard time being able to move after spinal anesthesia , 3-4 hours   Depression    GERD (gastroesophageal reflux disease)    Gout    no meds   Headache(784.0)    otc meds prn   Heart murmur    dx as a child, no problems as an adult   History of hiatal hernia    Hyperlipidemia    IBS (irritable bowel syndrome)    PONV (postoperative nausea and vomiting)    Pre-diabetes    Borderline, diet and exercise, no med   Sleep apnea    uses CIPAP machine at night   Wears partial dentures    bottom partial    Surgical History: Past Surgical History:  Procedure Laterality Date   ANTERIOR CERVICAL DECOMP/DISCECTOMY FUSION N/A 10/13/2022   Procedure: ANTERIOR CERVICAL DISECTOMY FUSION - CERVICAL THREE-CERVICAL FOUR - CERVICAL FOUR-CERVICAL FIVE REMOVAL OF HARDWARE CERVICAL FIVE-CERVICAL SIX;  Surgeon: Kary Kos, MD;  Location: [facility];  Service: Neurosurgery;  Laterality: N/A;   APPENDECTOMY     BACK SURGERY  2002   neck and back fusion   BIOPSY  12/27/2015   Procedure: BIOPSY;  Surgeon: Rogene Houston, MD;  [facility];  Service: Endoscopy;;  Fundus biopsies and duodenal biopsies   BIOPSY  02/10/2020   Procedure: BIOPSY;  Surgeon: Rogene Houston, MD;  [facility];  Service: Endoscopy;;  antral    CARDIAC CATHETERIZATION     CARDIAC CATHETERIZATION N/A 09/04/2016   Procedure: Right/Left Heart Cath and Coronary Angiography;  Surgeon: Peter M Martinique, MD;  Location: [facility];  Service: Cardiovascular;  Laterality: N/A;   CHOLECYSTECTOMY     CHONDROPLASTY  08/15/2011   Procedure: CHONDROPLASTY;  Surgeon: [name], MD;  [facility]: [facility];  Service: Orthopedics;  Laterality: Left;   COLONOSCOPY  06/27/2011   Procedure: COLONOSCOPY;  Surgeon: Rogene Houston, MD;  [facility];  Service: Endoscopy;  Laterality: N/A;  9:00 / Pt to be here at 9am for 10:45 procedure, benign polyps removed   COLONOSCOPY N/A 10/05/2014   Procedure: COLONOSCOPY;  Surgeon: Rogene Houston, MD;  [facility];  Service: Endoscopy;  Laterality: N/A;  930   COLONOSCOPY N/A 02/11/2018   Procedure: COLONOSCOPY;  Surgeon: Rogene Houston, MD;  [facility];  Service: Endoscopy;  Laterality: N/A;  830   ESOPHAGOGASTRODUODENOSCOPY N/A 12/27/2015   Procedure: ESOPHAGOGASTRODUODENOSCOPY (EGD);  Surgeon: Rogene Houston, MD;  [facility];  Service: Endoscopy;  Laterality: N/A;  3:00   ESOPHAGOGASTRODUODENOSCOPY (EGD) WITH PROPOFOL N/A 02/10/2020   Procedure: ESOPHAGOGASTRODUODENOSCOPY (EGD) WITH PROPOFOL;  Surgeon: Rogene Houston, MD;  [facility];  Service: Endoscopy;  Laterality: N/A;  155   HERNIA REPAIR  [id number]   umbilical hernia   JOINT REPLACEMENT  2023   knee and shoulder   KNEE ARTHROSCOPY     left knee   KNEE ARTHROSCOPY     right knee    LUMBAR LAMINECTOMY/DECOMPRESSION MICRODISCECTOMY  09/14/2012   Procedure: LUMBAR LAMINECTOMY/DECOMPRESSION MICRODISCECTOMY 1 LEVEL;  Surgeon: Floyce Stakes, MD;  Location: [facility];  Service: Neurosurgery;  Laterality: Right;  Right Lumbar three-four Diskectomy   neck fusion  2005   NECK SURGERY     PATELLA-FEMORAL ARTHROPLASTY Right 02/18/2022   Procedure: RIGHT KNEE PATELLA REPLACEMENT, CEMENTED;   Surgeon: Meredith Pel, MD;  Location: [facility];  Service: Orthopedics;  Laterality: Right;   POLYPECTOMY  02/11/2018   Procedure: POLYPECTOMY;  Surgeon: Rogene Houston, MD;  [facility];  Service: Endoscopy;;  colon   REVISION TOTAL SHOULDER TO REVERSE TOTAL SHOULDER Left 12/16/2018   Procedure: REVISION TOTAL SHOULDER TO REVERSE TOTAL SHOULDER;  Surgeon: Meredith Pel, MD;  Location: [facility];  Service: Orthopedics;  Laterality: Left;   SHOULDER ARTHROSCOPY WITH BICEPSTENOTOMY Left 09/23/2013   Procedure: SHOULDER ARTHROSCOPY WITH BICEPSTENOTOMY AND EXTENSIVE DEBRIDEMENT;  Surgeon: Carole Civil, MD;  [facility];  Service: Orthopedics;  Laterality: Left;   SHOULDER ARTHROSCOPY WITH ROTATOR CUFF REPAIR Left 05/05/2014   Procedure: SHOULDER ARTHROSCOPY LIMITED DEBRIDEMENT;  Surgeon: Carole Civil, MD;  [facility];  Service: Orthopedics;  Laterality: Left;   SHOULDER OPEN ROTATOR CUFF REPAIR Left 05/05/2014   Procedure: ROTATOR CUFF REPAIR SHOULDER OPEN;  Surgeon: Carole Civil, MD;  [facility];  Service: Orthopedics;  Laterality: Left;   SHOULDER OPEN ROTATOR CUFF REPAIR Left 12/16/2018   Procedure: LEFT SHOULDER POSSIBLE SUBSCAPULARIS REPAIR VS. REVISION TO REVERSE TOTAL SHOULDER REPLACEMENT;  Surgeon: Meredith Pel, MD;  Location: [facility];  Service: Orthopedics;  Laterality: Left;   SHOULDER SURGERY Right    Open [place]ford procedure   SPINAL FUSION     x2, 2003 and 2007   TOTAL KNEE ARTHROPLASTY Left 06/16/2017   Procedure: LEFT TOTAL KNEE ARTHROPLASTY;  Surgeon: Carole Civil, MD;  [facility];  Service: Orthopedics;  Laterality: Left;   TOTAL KNEE ARTHROPLASTY Right 05/14/2021   Procedure: TOTAL KNEE ARTHROPLASTY;  Surgeon: Carole Civil, MD;  [facility];  Service: Orthopedics;  Laterality: Right;   TOTAL SHOULDER ARTHROPLASTY Left 08/19/2018   Procedure: left shoulder replacement;  Surgeon: Meredith Pel,  MD;  Location: [facility];  Service: Orthopedics;  Laterality: Left;    Home Medications:  Allergies as of 11/18/2022       Reactions   Celebrex [celecoxib] Itching, Swelling   All over   Codeine Nausea And Vomiting, Other (See Comments)   Extreme stomach pain. This includes anything with the derivative of codeine in it. (Does tolerate hydrocodone)    Cortisone Swelling   SWELLING REACTION UNSPECIFIED    Doxycycline Swelling, Other (See Comments)   Made tongue turn black    Medrol [methylprednisolone] Hives, Swelling   Angioedema   Prednisone Swelling   SWELLING REACTION UNSPECIFIED    Relafen [nabumetone] Swelling   SWELLING REACTION UNSPECIFIED    Aspirin Other (See Comments)   Stomach cramps   Oxycodone-acetaminophen Itching, Other (See Comments)   Can tolerate with benadryl         Medication List        Accurate as of November 18, 2022  4:02 PM. If you have any questions,  ask your nurse or doctor.          cyclobenzaprine 10 MG tablet Commonly known as: FLEXERIL Take 1 tablet (10 mg total) by mouth 3 (three) times daily as needed for muscle spasms. Do not take with Robaxin   EPINEPHrine 0.3 mg/0.3 mL Soaj injection Commonly known as: EPI-PEN Inject 0.3 mg into the muscle as needed for anaphylaxis.   famotidine 20 MG tablet Commonly known as: PEPCID Take 1 tablet (20 mg total) by mouth 2 (two) times daily.   fexofenadine 180 MG tablet Commonly known as: Allegra Allergy Take 1 tablet (180 mg total) by mouth in the morning and at bedtime.   HYDROcodone-acetaminophen 5-325 MG tablet Commonly known as: NORCO/VICODIN Take 1 tablet by mouth every 4 (four) hours as needed for moderate pain.   hydrOXYzine 50 MG tablet Commonly known as: ATARAX Take 1 tablet (50 mg total) by mouth every 6 (six) hours as needed for itching or anxiety.   loratadine 10 MG tablet Commonly known as: Claritin Take 1 tablet (10 mg total) by mouth 2 (two) times daily. Follow up with your  PCP or allergist to discuss this med further   nicotine 7 mg/24hr patch Commonly known as: NICODERM CQ - dosed in mg/24 hr Place 1 patch (7 mg total) onto the skin daily.   nitroGLYCERIN 0.4 MG SL tablet Commonly known as: NITROSTAT PLACE (1) TABLET UNDER TONGUE EVERY 5 MINUTES UP TO (3) DOSES. IF NO RELIEF CALL 911. What changed: See the new instructions.   pantoprazole 40 MG tablet Commonly known as: PROTONIX TAKE (1) TABLET TWICE A DAY BEFORE MEALS. What changed: See the new instructions.   ProAir HFA 108 (90 Base) MCG/ACT inhaler Generic drug: albuterol Inhale 2 puffs into the lungs every 6 (six) hours as needed for wheezing or shortness of breath.   rosuvastatin 20 MG tablet Commonly known as: Crestor Take 1 tablet (20 mg total) by mouth daily.   sertraline 50 MG tablet Commonly known as: ZOLOFT Take 50 mg by mouth in the morning.   Stiolto Respimat 2.5-2.5 MCG/ACT Aers Generic drug: Tiotropium Bromide-Olodaterol Inhale 2 puffs into the lungs daily.        Allergies:  Allergies  Allergen Reactions   Celebrex [Celecoxib] Itching and Swelling    All over   Codeine Nausea And Vomiting and Other (See Comments)    Extreme stomach pain. This includes anything with the derivative of codeine in it. (Does tolerate hydrocodone)    Cortisone Swelling    SWELLING REACTION UNSPECIFIED    Doxycycline Swelling and Other (See Comments)    Made tongue turn black    Medrol [Methylprednisolone] Hives and Swelling    Angioedema    Prednisone Swelling    SWELLING REACTION UNSPECIFIED    Relafen [Nabumetone] Swelling    SWELLING REACTION UNSPECIFIED    Aspirin Other (See Comments)    Stomach cramps   Oxycodone-Acetaminophen Itching and Other (See Comments)    Can tolerate with benadryl     Family History: Family History  Problem Relation Age of Onset   Diabetes Mother    Alzheimer's disease Father    Diabetes Father    Diabetes Other    Lung disease Other     Arthritis Other    Anesthesia problems Neg Hx    Hypotension Neg Hx    Malignant hyperthermia Neg Hx    Pseudochol deficiency Neg Hx    Sleep apnea Neg Hx     Social History:  reports  that he quit smoking about 2 months ago. His smoking use included cigarettes. He has a 44.00 pack-year smoking history. He has been exposed to tobacco smoke. He has never used smokeless tobacco. He reports that he does not drink alcohol and does not use drugs.  ROS: All other review of systems were reviewed and are negative except what is noted above in HPI  Physical Exam: BP (!) 150/80   Pulse 83   Constitutional:  Alert and oriented, No acute distress. HEENT: [place] AT, moist mucus membranes.  Trachea midline, no masses. Cardiovascular: No clubbing, cyanosis, or edema. Respiratory: Normal respiratory effort, no increased work of breathing. GI: Abdomen is soft, nontender, nondistended, no abdominal masses GU: No CVA tenderness.  Lymph: No cervical or inguinal lymphadenopathy. Skin: No rashes, bruises or suspicious lesions. Neurologic: Grossly intact, no focal deficits, moving all 4 extremities. Psychiatric: Normal mood and affect.  Laboratory Data: Lab Results  Component Value Date   WBC 5.7 11/12/2022   HGB 12.0 (L) 11/12/2022   HCT 36.7 (L) 11/12/2022   MCV 84 11/12/2022   PLT 259 10/26/2022    Lab Results  Component Value Date   CREATININE 0.88 11/12/2022    No results found for: "PSA"  Lab Results  Component Value Date   TESTOSTERONE 52 (L) 10/02/2022    Lab Results  Component Value Date   HGBA1C 6.1 (H) 06/06/2022    Urinalysis    Component Value Date/Time   COLORURINE YELLOW 02/11/2022 1008   APPEARANCEUR Clear 07/11/2022 0916   LABSPEC 1.012 02/11/2022 1008   PHURINE 6.0 02/11/2022 1008   GLUCOSEU Negative 07/11/2022 0916   HGBUR NEGATIVE 02/11/2022 1008   BILIRUBINUR negative 10/22/2022 1017   BILIRUBINUR Negative 07/11/2022 0916   KETONESUR negative 10/22/2022 1017    KETONESUR NEGATIVE 02/11/2022 1008   PROTEINUR Negative 07/11/2022 0916   PROTEINUR NEGATIVE 02/11/2022 1008   UROBILINOGEN 0.2 10/22/2022 1017   NITRITE Negative 10/22/2022 1017   NITRITE Negative 07/11/2022 0916   NITRITE NEGATIVE 02/11/2022 1008   LEUKOCYTESUR Negative 10/22/2022 1017   LEUKOCYTESUR Negative 07/11/2022 0916   LEUKOCYTESUR NEGATIVE 02/11/2022 1008    Lab Results  Component Value Date   LABMICR See below: 07/11/2022   WBCUA 0-5 07/11/2022   LABEPIT None seen 07/11/2022   BACTERIA None seen 07/11/2022    Pertinent Imaging:  Results for orders placed during the hospital encounter of 02/19/10  DG Abd 1 View  Narrative Clinical Data: Vomiting and abdominal pain.  Status post lumbar spine surgery 2 days ago.  ABDOMEN - 1 VIEW  Comparison: None.  Findings: Mild gaseous distention of the colon with scattered air fluid levels, including the rectum.  Surgical clips in the right upper and lower abdomen.  Laminectomy defects, interbody spacer and pedicle screw and rod fixation at the L4-5 level.  Minimal dextroconvex lumbar scoliosis.  IMPRESSION: Mild colonic ileus.  Provider: Rober Minion  No results found for this or any previous visit.  No results found for this or any previous visit.  No results found for this or any previous visit.  No results found for this or any previous visit.  No valid procedures specified. No results found for this or any previous visit.  No results found for this or any previous visit.   Assessment & Plan:    1. Hypogonadism in male -we will trail angrogel 2 paskets daily -followup 3 months with testosterone labs  2. Benign prostatic hyperplasia with urinary obstruction Patient defers therapy at this time  3. Frequency of micturition The patient defers therapy at this time   No follow-ups on file.  Nicolette Bang, MD  [facility] [place]

## 2022-11-20 ENCOUNTER — Encounter: Payer: Self-pay | Admitting: Urology

## 2022-11-20 NOTE — Progress Notes (Signed)
Can you please let this patient know that his test results have all returned. Alpha gal was negative, however he did show a very small sensitivity to pork. Have him avoid pork for now until we get the hives under control. Environmental testing was mildly positive to weed pollen. Chronic urticaria panel was within normal limits-this measures for an autoimmune cause of hives. Tryptase which measures mast cell activity was within normal limits. And thyroid antibodies were normal. Please ask how the hives are controled at this time. Thank you

## 2022-11-20 NOTE — Patient Instructions (Signed)

## 2022-11-21 ENCOUNTER — Telehealth: Payer: Self-pay

## 2022-11-21 NOTE — Telephone Encounter (Signed)
Open in error

## 2022-12-01 ENCOUNTER — Other Ambulatory Visit: Payer: Self-pay | Admitting: Internal Medicine

## 2022-12-01 ENCOUNTER — Telehealth: Payer: Self-pay | Admitting: Internal Medicine

## 2022-12-01 MED ORDER — HYDROXYZINE HCL 50 MG PO TABS: 50.0000 mg | ORAL_TABLET | Freq: Three times a day (TID) | ORAL | 0 refills | Status: DC | PRN

## 2022-12-01 MED ORDER — FAMOTIDINE 20 MG PO TABS: 20.0000 mg | ORAL_TABLET | Freq: Two times a day (BID) | ORAL | 0 refills | Status: DC | PRN

## 2022-12-01 MED ORDER — HYDROXYZINE HCL 50 MG PO TABS: 50.0000 mg | ORAL_TABLET | Freq: Every evening | ORAL | 1 refills | Status: DC | PRN

## 2022-12-01 NOTE — Addendum Note (Signed)
Addended by: Isabel Caprice on: 12/01/2022 02:57 PM   Modules accepted: Orders

## 2022-12-01 NOTE — Telephone Encounter (Signed)
Called and informed patients wife not to use testosterone medication on hives. The wife understood and stated they will be at his next appointment 12/03/22.  Pio [phone]

## 2022-12-01 NOTE — Telephone Encounter (Signed)
Patients spouse request a call back, Patient has questions about medications.

## 2022-12-01 NOTE — Telephone Encounter (Signed)
Wife wants to know if he can put his testosterone medication on top of his hives.  Hydrocine ada

## 2022-12-01 NOTE — Progress Notes (Signed)
Hydroxyzine refilled

## 2022-12-02 ENCOUNTER — Other Ambulatory Visit: Payer: Self-pay | Admitting: Cardiology

## 2022-12-02 ENCOUNTER — Ambulatory Visit: Payer: PPO | Admitting: Pulmonary Disease

## 2022-12-03 ENCOUNTER — Ambulatory Visit (INDEPENDENT_AMBULATORY_CARE_PROVIDER_SITE_OTHER): Payer: PPO | Admitting: Allergy & Immunology

## 2022-12-03 ENCOUNTER — Encounter: Payer: Self-pay | Admitting: Allergy & Immunology

## 2022-12-03 ENCOUNTER — Other Ambulatory Visit: Payer: Self-pay

## 2022-12-03 VITALS — BP 142/68 | HR 61 | Temp 98.4°F | Resp 16

## 2022-12-03 DIAGNOSIS — L501 Idiopathic urticaria: Secondary | ICD-10-CM

## 2022-12-03 DIAGNOSIS — L500 Allergic urticaria: Secondary | ICD-10-CM

## 2022-12-03 NOTE — Progress Notes (Signed)
Immunotherapy   Patient Details  Name: Christopher [name] MRN: [id number] Date of Birth: Sep 18, 1955  12/03/2022  [name] started injections for  Xolair Following schedule:  Every fourteen days Frequency: Every two weeks Epi-Pen:Prescription for Epi-Pen given Consent signed in office and patient instructions given. Patient waited in office for thirty minutes without an issue.   [name] 11/12/2022, 4:06 PM

## 2022-12-03 NOTE — Progress Notes (Signed)
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 - Get labs today to look for the most common food allergens (IT IS IMPORTANT THAT YOU GET THIS DONE TODAY). - This will rule out >95% of all food allergies.  - Continue with suppressive antihistamine dosing:  - Continue Allegra 360 mg twice a day - Continue famotidine 40 mg twice a day - Continue hydroxyzine 50 mg once at bedtime and  in the morning.  - Add on Singulair (montelukast) . - Add on Claritin  at noon.  - Okay to use Sarna lotion. Can place in refrigerator so it has a nice cooling effect when you apply it.  Also moisturize skin twice daily as dry skin itches using a cream such as Cerave, Cetaphil, Eucerin, Aveeno.  - Epipen is only for swelling with laryngeal involvement so if you have trouble breathing and trouble swallowing.  Outside of this, do not use it for hives/other swelling.   - Additional sample of Xolair provided today.  2. Return in about 4 weeks (around 12/31/2022). You can have the follow up appointment with Dr. Dellis Anes or a Nurse Practicioner (our Nurse Practitioners are excellent and always have Physician oversight!).   Subjective:   Christopher [name] is a 67 y.o. male presenting today for follow up of  Chief Complaint  Patient presents with   Urticaria   Pruritus    Christopher [name] has a history of the following: Patient Active Problem List   Diagnosis Date Noted   History of anaphylaxis  11/13/2022   Idiopathic urticaria 11/03/2022   Angioedema 10/24/2022   Hives 10/24/2022   Spinal stenosis in cervical region 10/13/2022   DOE (dyspnea on exertion) 09/26/2022   Abnormal EKG 09/18/2022   Hypogonadism in male 07/07/2022   Preop cardiovascular exam 07/07/2022   Depression, recurrent 06/06/2022   Chronic musculoskeletal pain 06/06/2022   Fatigue 06/06/2022   Current tobacco use 06/06/2022   Encounter to establish care 06/06/2022   S/P right knee surgery 02/18/2022   S/P TKR (total knee replacement), right 05/14/21 07/02/2021   Status post total right knee replacement 05/14/2021   Primary osteoarthritis of right knee    Renal cyst 10/03/2020   Frequency of micturition 10/03/2020   Benign prostatic hyperplasia with urinary obstruction 10/03/2020   IBS (irritable bowel syndrome) 07/05/2020   Lumbar disc herniation 06/29/2019   Instability of prosthetic shoulder joint    Primary osteoarthritis, left shoulder    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   History of colonic polyps 11/26/2017   S/P total knee replacement, left 06/16/17 06/16/2017   Primary osteoarthritis of left knee    HLD (hyperlipidemia) 09/04/2016   Angina pectoris 09/04/2016   OSA (obstructive sleep apnea) 09/04/2016   COPD (chronic obstructive pulmonary disease) 09/04/2016   Rotator cuff tear 05/05/2014   S/P shoulder surgery 09/26/2013   Arthritis, shoulder region 09/26/2013   Bursitis, shoulder 09/26/2013   Synovitis of shoulder 09/26/2013   Labral tear of shoulder, degenerative 09/26/2013   Biceps tendon tear  09/26/2013   Rotator cuff syndrome of left shoulder 06/21/2013   Arthritis 06/21/2013   Patellofemoral arthritis of right knee 03/29/2013   Effusion of knee joint 03/29/2013   Bursitis/tendonitis, shoulder 03/10/2013   Effusion of knee joint, left 08/18/2011   Knee pain 08/18/2011   Acute torn meniscus 07/30/2011   Old torn meniscus of knee 07/30/2011   ARTHRITIS, LEFT KNEE 10/15/2010    MEDIAL MENISCUS TEAR, RIGHT 10/15/2010   HIP PAIN 01/15/2010   DEGENERATIVE DISC DISEASE, LUMBOSACRAL SPINE W/RADICULOPATHY 01/15/2010   PLICA SYNDROME 08/09/2009   DERANGEMENT MENISCUS 07/09/2009   JOINT EFFUSION, LEFT KNEE 07/09/2009   Unilateral primary osteoarthritis, left knee 01/30/2009   KNEE PAIN 01/30/2009   ANKLE SPRAIN, RIGHT 11/08/2007    History obtained from: chart review and patient and his wife .  Christopher [name] is a 67 y.o. male presenting for a follow up visit.  He was last seen in March 2024 by Christopher [name] one of our nurse practitioners.  At that time, he was experiencing urticaria and angioedema of unknown etiology.  He was started on Allegra 360 mg twice daily and famotidine 40 mg twice daily.  He was also continued on hydroxyzine 50 mg at bedtime.  Copious moisturization was recommended.  He did receive a sample of Xolair 300 mg in the office today and signed paperwork to start Xolair injections.  He did have labs sent showed a very low IgE to pork but was otherwise negative.  Since last visit, he actually did well for a period of 2 weeks or so.  This only did help quite a bit.  He remained on the Allegra and Pepcid twice a day as recommended by the nurse practitioner at the last visit.  He also remains on hydroxyzine at bedtime.  He still wants to know what is causing the hives, although I tell him that we rarely if ever find out what is going on.  He has not heard about getting approved for Xolair yet.  I talked to [facility] and his application for patient assistance is still pending.  However, his hives started coming back yesterday.  They are all over his body, including his legs and arms.  They have not had to use his EpiPen.  Food Allergy Symptom History: He has been avoiding pork.  He did eat some shrimp last night and they popped up. He eats shrimp all of the time without a problem.  He wonders if there are other foods that might be related to this.  He is wondering if we can do some  testing.  Otherwise, there have been no changes to his past medical history, surgical history, family history, or social history.    Review of Systems  Constitutional: Negative.  Negative for fever, malaise/fatigue and weight loss.  HENT: Negative.  Negative for congestion, ear discharge and ear pain.   Eyes:  Negative for pain, discharge and redness.  Respiratory:  Negative for cough, sputum production, shortness of breath and wheezing.   Cardiovascular: Negative.  Negative for chest pain and palpitations.  Gastrointestinal:  Negative for abdominal pain and heartburn.  Skin:  Positive for itching and rash.  Neurological:  Negative for dizziness and headaches.  Endo/Heme/Allergies:  Negative for environmental allergies. Does not bruise/bleed easily.       Objective:   Blood pressure (!) 142/68, pulse 61, temperature 98.4 F (36.9 C), resp. rate 16, SpO2 96 %. There is no height or weight on file to calculate BMI.    Physical Exam  Constitutional:      Appearance: He is well-developed.  HENT:     Head: Normocephalic and atraumatic.     Right Ear: Tympanic membrane, ear canal and external ear normal. No drainage, swelling or tenderness. Tympanic membrane is not injected, scarred, erythematous, retracted or bulging.     Left Ear: Tympanic membrane, ear canal and external ear normal. No drainage, swelling or tenderness. Tympanic membrane is not injected, scarred, erythematous, retracted or bulging.     Nose: No nasal deformity, septal deviation, mucosal edema or rhinorrhea.     Right Sinus: No maxillary sinus tenderness or frontal sinus tenderness.     Left Sinus: No maxillary sinus tenderness or frontal sinus tenderness.     Mouth/Throat:     Mouth: Mucous membranes are not pale and not dry.     Pharynx: Uvula midline.  Eyes:     General:        Right eye: No discharge.        Left eye: No discharge.     Conjunctiva/sclera: Conjunctivae normal.     Right eye: Right  conjunctiva is not injected. No chemosis.    Left eye: Left conjunctiva is not injected. No chemosis.    Pupils: Pupils are equal, round, and reactive to light.  Cardiovascular:     Rate and Rhythm: Normal rate and regular rhythm.     Heart sounds: Normal heart sounds.  Pulmonary:     Effort: Pulmonary effort is normal. No tachypnea, accessory muscle usage or respiratory distress.     Breath sounds: Normal breath sounds. No wheezing, rhonchi or rales.  Chest:     Chest wall: No tenderness.  Lymphadenopathy:     Head:     Right side of head: No submandibular, tonsillar or occipital adenopathy.     Left side of head: No submandibular, tonsillar or occipital adenopathy.     Cervical: No cervical adenopathy.  Skin:    Coloration: Skin is not pale.     Findings: Rash present. No abrasion, erythema or petechiae. Rash is urticarial. Rash is not papular or vesicular.     Comments: Urticarial lesions over the bilateral arms as well as legs.  Neurological:     Mental Status: He is alert.      Diagnostic studies: none   He did receive another sample of 300 mg of Xolair.  He did receive it about 1 week early, but he was fairly miserable and insistent on it.    Malachi Bonds, MD  [facility]

## 2022-12-03 NOTE — Patient Instructions (Addendum)
Idiopathic Urticaria/Angioedema (Hives/Swelling): - Sample of Xolair provided. - Get labs today to look for the most common food allergens (IT IS IMPORTANT THAT YOU GET THIS DONE TODAY). - This will rule out >95% of all food allergies.  - Continue with suppressive antihistamine dosing:  - Continue Allegra 360 mg twice a day - Continue famotidine 40 mg twice a day - Continue hydroxyzine 50 mg once at bedtime and 25mg  in the morning.  - Add on Singulair (montelukast) 10mg . - Add on Claritin 20mg  at noon.  - Okay to use Sarna lotion. Can place in refrigerator so it has a nice cooling effect when you apply it.  Also moisturize skin twice daily as dry skin itches using a cream such as Cerave, Cetaphil, Eucerin, Aveeno.  - Epipen is only for swelling with laryngeal involvement so if you have trouble breathing and trouble swallowing.  Outside of this, do not use it for hives/other swelling.   - Additional sample of Xolair provided today.  2. Return in about 4 weeks (around 12/31/2022). You can have the follow up appointment with Dr. Ernst Bowler or a Nurse Practicioner (our Nurse Practitioners are excellent and always have Physician oversight!).    Please inform us of any Emergency Department visits, hospitalizations, or changes in symptoms. Call us before going to the ED for breathing or allergy symptoms since we might be able to fit you in for a sick visit. Feel free to contact us anytime with any questions, problems, or concerns.  It was a pleasure to meet you and your family today!  Websites that have reliable patient information: 1. American Academy of Asthma, Allergy, and Immunology: www.aaaai.org 2. Food Allergy Research and Education (FARE): foodallergy.org 3. Mothers of Asthmatics: http://www.asthmacommunitynetwork.org 4. American College of Allergy, Asthma, and Immunology: www.acaai.org   COVID-19 Vaccine Information can be found at:  ShippingScam.co.uk For questions related to vaccine distribution or appointments, please email [email or web address] or call [phone].   We realize that you might be concerned about having an allergic reaction to the COVID19 vaccines. To help with that concern, WE ARE OFFERING THE COVID19 VACCINES IN OUR OFFICE! Ask the front desk for dates!     "Like" [place] on Facebook and Instagram for our latest updates!      A healthy democracy works best when [employer] participate! Make sure you are registered to vote! If you have moved or changed any of your contact information, you will need to get this updated before voting!  In some cases, you MAY be able to register to vote online: [email or web address]

## 2022-12-05 ENCOUNTER — Encounter: Payer: Self-pay | Admitting: Allergy & Immunology

## 2022-12-10 LAB — ALLERGEN, WHEAT, F4: Wheat IgE: <0.1 kU/L

## 2022-12-10 LAB — ALLERGY PANEL 18, NUT MIX GROUP
Allergen Coconut IgE: <0.1 kU/L
F020-IgE Almond: <0.1 kU/L
F202-IgE Cashew Nut: <0.1 kU/L
Hazelnut (Filbert) IgE: <0.1 kU/L
Peanut IgE: <0.1 kU/L
Pecan Nut IgE: <0.1 kU/L
Sesame Seed IgE: <0.1 kU/L

## 2022-12-10 LAB — ALLERGEN SOYBEAN: Soybean IgE: <0.1 kU/L

## 2022-12-10 LAB — ALLERGY PANEL 19, SEAFOOD GROUP
Allergen Salmon IgE: <0.1 kU/L
Catfish: <0.1 kU/L
Codfish IgE: <0.1 kU/L
F023-IgE Crab: <0.1 kU/L
F080-IgE Lobster: <0.1 kU/L
Shrimp IgE: <0.1 kU/L
Tuna: <0.1 kU/L

## 2022-12-10 LAB — ALLERGEN MILK: Milk IgE: 0.12 kU/L — AB

## 2022-12-10 LAB — F245-IGE EGG, WHOLE: Egg, Whole IgE: <0.1 kU/L

## 2022-12-16 ENCOUNTER — Telehealth: Payer: Self-pay | Admitting: *Deleted

## 2022-12-16 NOTE — Telephone Encounter (Signed)
L/m delivery of Xolair for 4/19 and can call [place] to make appt in [place] for next week

## 2022-12-17 ENCOUNTER — Telehealth: Payer: Self-pay

## 2022-12-17 NOTE — Telephone Encounter (Addendum)
I have scheduled him for RDV on Monday 12/22/2022. I informed wife I would call on Friday to inform them if Xolair comes in.

## 2022-12-17 NOTE — Telephone Encounter (Signed)
Patient's spouse, inquiring about Christopher [name]'s xolair.  Had a message they could not understand left on their phone.  Informed them his Christopher [name] was scheduled to be delivered on 4/19 and then an appt could be made to get his xolair.  They will call late morning to see if it was delivered.  Patient received first dose in office visit. Patient states he is to receive every 4 weeks but that it had to be delivered to [place] before receiving in [place].

## 2022-12-19 NOTE — Telephone Encounter (Signed)
Spoke with wife, informed her that medications did arrive today and will get transferred to the [place] office for his appointment on Monday. She verbalized understanding. She did ask if he had a co-pay, I informed wife I would reach out to [facility]. Please advise.

## 2022-12-19 NOTE — Telephone Encounter (Signed)
Called to inform patient his medication did arrive today and will be in [place] for his appointment on Monday, however the phone rang and then said caller was unavailable. Will try again later.

## 2022-12-22 ENCOUNTER — Ambulatory Visit (INDEPENDENT_AMBULATORY_CARE_PROVIDER_SITE_OTHER): Payer: PPO

## 2022-12-22 DIAGNOSIS — L501 Idiopathic urticaria: Secondary | ICD-10-CM | POA: Diagnosis not present

## 2022-12-31 ENCOUNTER — Other Ambulatory Visit: Payer: Self-pay | Admitting: Internal Medicine

## 2022-12-31 DIAGNOSIS — E782 Mixed hyperlipidemia: Secondary | ICD-10-CM

## 2023-01-05 ENCOUNTER — Ambulatory Visit: Payer: PPO

## 2023-01-05 ENCOUNTER — Encounter: Payer: Self-pay | Admitting: Internal Medicine

## 2023-01-05 ENCOUNTER — Ambulatory Visit (INDEPENDENT_AMBULATORY_CARE_PROVIDER_SITE_OTHER): Payer: PPO | Admitting: Internal Medicine

## 2023-01-05 VITALS — BP 138/70 | HR 66 | Ht 66.0 in | Wt 232.6 lb

## 2023-01-05 DIAGNOSIS — J449 Chronic obstructive pulmonary disease, unspecified: Secondary | ICD-10-CM

## 2023-01-05 DIAGNOSIS — R7303 Prediabetes: Secondary | ICD-10-CM | POA: Diagnosis not present

## 2023-01-05 DIAGNOSIS — G4733 Obstructive sleep apnea (adult) (pediatric): Secondary | ICD-10-CM | POA: Diagnosis not present

## 2023-01-05 DIAGNOSIS — Z720 Tobacco use: Secondary | ICD-10-CM

## 2023-01-05 DIAGNOSIS — L501 Idiopathic urticaria: Secondary | ICD-10-CM

## 2023-01-05 DIAGNOSIS — E782 Mixed hyperlipidemia: Secondary | ICD-10-CM

## 2023-01-05 NOTE — Patient Instructions (Signed)
It was a pleasure to see you today.  Thank you for giving [place] the opportunity to be involved in your care.  Below is a brief recap of your visit and next steps.  We will plan to see you again in 6 months.  Summary No medication changes today Repeat cholesterol panel and A1c ordered Follow up in 6 months

## 2023-01-05 NOTE — Progress Notes (Signed)
Established Patient Office Visit  Subjective   Patient ID: Christopher [name], male    DOB: Aug 11, 1956  Age: 67 y.o. MRN: [id number]  Chief Complaint  Patient presents with   Sleep Apnea    Follow up    Christopher [name] returns to care today for routine follow-up.  He was last evaluated at [facility] on 3/4 by Dr. Barbaraann Faster at which time NicoDerm patches were prescribed.  In the interim he has been seen by allergy and asthma for follow-up in the setting of recent hospital admission for urticaria and has started Xolair.  Christopher [name] reports significant discomfort in his neck.  He underwent anterior discectomies and fusion of C3-4 and C4-5 on 2/12 with Dr. Wynetta Emery.  Over the last 2 weeks he has experienced persistent pain in both sides of his neck.  He has follow-up with Dr. Wynetta Emery in 2 weeks and plans to request repeat imaging.  Christopher [name] otherwise has no acute concerns to discuss today.  Past Medical History:  Diagnosis Date   Anxiety    Arthritis    Asthma    BPH (benign prostatic hyperplasia)    Complication of anesthesia    pt had a hard time being able to move after spinal anesthesia , 3-4 hours   Depression    GERD (gastroesophageal reflux disease)    Gout    no meds   Headache(784.0)    otc meds prn   Heart murmur    dx as a child, no problems as an adult   History of hiatal hernia    Hyperlipidemia    IBS (irritable bowel syndrome)    PONV (postoperative nausea and vomiting)    Pre-diabetes    Borderline, diet and exercise, no med   Sleep apnea    uses CIPAP machine at night   Wears partial dentures    bottom partial   Past Surgical History:  Procedure Laterality Date   ANTERIOR CERVICAL DECOMP/DISCECTOMY FUSION N/A 10/13/2022   Procedure: ANTERIOR CERVICAL DISECTOMY FUSION - CERVICAL THREE-CERVICAL FOUR - CERVICAL FOUR-CERVICAL FIVE REMOVAL OF HARDWARE CERVICAL FIVE-CERVICAL SIX;  Surgeon: Donalee Citrin, MD;  Location: [facility] OR;  Service: Neurosurgery;  Laterality: N/A;   APPENDECTOMY      BACK SURGERY  2002   neck and back fusion   BIOPSY  12/27/2015   Procedure: BIOPSY;  Surgeon: Malissa Hippo, MD;  Location: [facility];  Service: Endoscopy;;  Fundus biopsies and duodenal biopsies   BIOPSY  02/10/2020   Procedure: BIOPSY;  Surgeon: Malissa Hippo, MD;  [facility];  Service: Endoscopy;;  antral   CARDIAC CATHETERIZATION     CARDIAC CATHETERIZATION N/A 09/04/2016   Procedure: Right/Left Heart Cath and Coronary Angiography;  Surgeon: Peter M Swaziland, MD;  Location: [facility] INVASIVE CV LAB;  Service: Cardiovascular;  Laterality: N/A;   CHOLECYSTECTOMY     CHONDROPLASTY  08/15/2011   Procedure: CHONDROPLASTY;  Surgeon: Fuller Canada, MD;  Location: [facility] ORS;  Service: Orthopedics;  Laterality: Left;   COLONOSCOPY  06/27/2011   Procedure: COLONOSCOPY;  Surgeon: Malissa Hippo, MD;  [facility];  Service: Endoscopy;  Laterality: N/A;  9:00 / Pt to be here at 9am for 10:45 procedure, benign polyps removed   COLONOSCOPY N/A 10/05/2014   Procedure: COLONOSCOPY;  Surgeon: Malissa Hippo, MD;  [facility];  Service: Endoscopy;  Laterality: N/A;  930   COLONOSCOPY N/A 02/11/2018   Procedure: COLONOSCOPY;  Surgeon: Malissa Hippo, MD;  [facility];  Service: Endoscopy;  Laterality: N/A;  830   ESOPHAGOGASTRODUODENOSCOPY N/A 12/27/2015   Procedure: ESOPHAGOGASTRODUODENOSCOPY (EGD);  Surgeon: Malissa Hippo, MD;  [facility];  Service: Endoscopy;  Laterality: N/A;  3:00   ESOPHAGOGASTRODUODENOSCOPY (EGD) WITH PROPOFOL N/A 02/10/2020   Procedure: ESOPHAGOGASTRODUODENOSCOPY (EGD) WITH PROPOFOL;  Surgeon: Malissa Hippo, MD;  [facility];  Service: Endoscopy;  Laterality: N/A;  155   HERNIA REPAIR  1998   umbilical hernia   JOINT REPLACEMENT  2023   knee and shoulder   KNEE ARTHROSCOPY     left knee   KNEE ARTHROSCOPY     right knee    LUMBAR LAMINECTOMY/DECOMPRESSION MICRODISCECTOMY  09/14/2012    Procedure: LUMBAR LAMINECTOMY/DECOMPRESSION MICRODISCECTOMY 1 LEVEL;  Surgeon: Karn Cassis, MD;  Location: [facility];  Service: Neurosurgery;  Laterality: Right;  Right Lumbar three-four Diskectomy   neck fusion  2005   NECK SURGERY     PATELLA-FEMORAL ARTHROPLASTY Right 02/18/2022   Procedure: RIGHT KNEE PATELLA REPLACEMENT, CEMENTED;  Surgeon: Cammy Copa, MD;  Location: [facility];  Service: Orthopedics;  Laterality: Right;   POLYPECTOMY  02/11/2018   Procedure: POLYPECTOMY;  Surgeon: Malissa Hippo, MD;  [facility];  Service: Endoscopy;;  colon   REVISION TOTAL SHOULDER TO REVERSE TOTAL SHOULDER Left 12/16/2018   Procedure: REVISION TOTAL SHOULDER TO REVERSE TOTAL SHOULDER;  Surgeon: Cammy Copa, MD;  Location: [facility];  Service: Orthopedics;  Laterality: Left;   SHOULDER ARTHROSCOPY WITH BICEPSTENOTOMY Left 09/23/2013   Procedure: SHOULDER ARTHROSCOPY WITH BICEPSTENOTOMY AND EXTENSIVE DEBRIDEMENT;  Surgeon: Vickki Hearing, MD;  Location: [facility];  Service: Orthopedics;  Laterality: Left;   SHOULDER ARTHROSCOPY WITH ROTATOR CUFF REPAIR Left 05/05/2014   Procedure: SHOULDER ARTHROSCOPY LIMITED DEBRIDEMENT;  Surgeon: Vickki Hearing, MD;  Location: [facility];  Service: Orthopedics;  Laterality: Left;   SHOULDER OPEN ROTATOR CUFF REPAIR Left 05/05/2014   Procedure: ROTATOR CUFF REPAIR SHOULDER OPEN;  Surgeon: Vickki Hearing, MD;  Location: [facility];  Service: Orthopedics;  Laterality: Left;   SHOULDER OPEN ROTATOR CUFF REPAIR Left 12/16/2018   Procedure: LEFT SHOULDER POSSIBLE SUBSCAPULARIS REPAIR VS. REVISION TO REVERSE TOTAL SHOULDER REPLACEMENT;  Surgeon: Cammy Copa, MD;  Location: [facility] OR;  Service: Orthopedics;  Laterality: Left;   SHOULDER SURGERY Right    Open Mumford procedure   SPINAL FUSION     x2, 2003 and 2007   TOTAL KNEE ARTHROPLASTY Left 06/16/2017   Procedure: LEFT TOTAL KNEE ARTHROPLASTY;  Surgeon: Vickki Hearing, MD;  Location:  [facility];  Service: Orthopedics;  Laterality: Left;   TOTAL KNEE ARTHROPLASTY Right 05/14/2021   Procedure: TOTAL KNEE ARTHROPLASTY;  Surgeon: Vickki Hearing, MD;  Location: [facility];  Service: Orthopedics;  Laterality: Right;   TOTAL SHOULDER ARTHROPLASTY Left 08/19/2018   Procedure: left shoulder replacement;  Surgeon: Cammy Copa, MD;  Location: [facility] OR;  Service: Orthopedics;  Laterality: Left;   Social History   Tobacco Use   Smoking status: Former    Packs/day: 1.00    Years: 44.00    Additional pack years: 0.00    Total pack years: 44.00    Types: Cigarettes    Quit date: 08/25/2022    Years since quitting: 0.3    Passive exposure: Current   Smokeless tobacco: Never   Tobacco comments:    smokes a pack a day. since age 3  Vaping Use   Vaping Use: Never  used  Substance Use Topics   Alcohol use: Never   Drug use: Never   Family History  Problem Relation Age of Onset   Diabetes Mother    Alzheimer's disease Father    Diabetes Father    Diabetes Other    Lung disease Other    Arthritis Other    Anesthesia problems Neg Hx    Hypotension Neg Hx    Malignant hyperthermia Neg Hx    Pseudochol deficiency Neg Hx    Sleep apnea Neg Hx    Allergies  Allergen Reactions   Celebrex [Celecoxib] Itching and Swelling    All over   Codeine Nausea And Vomiting and Other (See Comments)    Extreme stomach pain. This includes anything with the derivative of codeine in it. (Does tolerate hydrocodone)    Cortisone Swelling    SWELLING REACTION UNSPECIFIED    Doxycycline Swelling and Other (See Comments)    Made tongue turn black    Medrol [Methylprednisolone] Hives and Swelling    Angioedema    Prednisone Swelling    SWELLING REACTION UNSPECIFIED    Relafen [Nabumetone] Swelling    SWELLING REACTION UNSPECIFIED    Aspirin Other (See Comments)    Stomach cramps   Oxycodone-Acetaminophen Itching and Other (See Comments)    Can tolerate with benadryl    Review of  Systems  Musculoskeletal:  Positive for neck pain.  All other systems reviewed and are negative.     Objective:     BP 138/70   Pulse 66   Ht 5\' 6"  (1.676 m)   Wt 232 lb 9.6 oz (105.5 kg)   SpO2 96%   BMI 37.54 kg/m  BP Readings from Last 3 Encounters:  01/05/23 138/70  12/03/22 (!) 142/68  11/18/22 (!) 150/80   Physical Exam Vitals reviewed.  Constitutional:      General: He is not in acute distress.    Appearance: Normal appearance. He is obese. He is not ill-appearing.  HENT:     Head: Normocephalic and atraumatic.     Right Ear: External ear normal.     Left Ear: External ear normal.     Nose: Nose normal. No congestion or rhinorrhea.     Mouth/Throat:     Mouth: Mucous membranes are moist.     Pharynx: Oropharynx is clear.  Eyes:     General: No scleral icterus.    Extraocular Movements: Extraocular movements intact.     Conjunctiva/sclera: Conjunctivae normal.     Pupils: Pupils are equal, round, and reactive to light.  Cardiovascular:     Rate and Rhythm: Normal rate and regular rhythm.     Pulses: Normal pulses.     Heart sounds: Normal heart sounds. No murmur heard. Pulmonary:     Effort: Pulmonary effort is normal.     Breath sounds: Normal breath sounds. No wheezing, rhonchi or rales.  Abdominal:     General: Abdomen is flat. Bowel sounds are normal. There is no distension.     Palpations: Abdomen is soft.     Tenderness: There is no abdominal tenderness.     Hernia: A hernia (Ventral hernia) is present.  Musculoskeletal:        General: No swelling or deformity. Normal range of motion.     Cervical back: Normal range of motion.  Skin:    General: Skin is warm and dry.     Capillary Refill: Capillary refill takes less than 2 seconds.  Neurological:  General: No focal deficit present.     Mental Status: He is alert and oriented to person, place, and time.     Motor: No weakness.  Psychiatric:        Mood and Affect: Mood normal.         Behavior: Behavior normal.        Thought Content: Thought content normal.   Last CBC Lab Results  Component Value Date   WBC 5.7 11/12/2022   HGB 12.0 (L) 11/12/2022   HCT 36.7 (L) 11/12/2022   MCV 84 11/12/2022   MCH 27.4 11/12/2022   RDW 14.2 11/12/2022   PLT 259 10/26/2022   Last metabolic panel Lab Results  Component Value Date   GLUCOSE 102 (H) 11/12/2022   NA 143 11/12/2022   K 4.2 11/12/2022   CL 104 11/12/2022   CO2 23 11/12/2022   BUN 11 11/12/2022   CREATININE 0.88 11/12/2022   EGFR 96 11/12/2022   CALCIUM 9.2 11/12/2022   PHOS 3.6 10/26/2022   PROT 5.4 (L) 10/26/2022   ALBUMIN 2.8 (L) 10/26/2022   LABGLOB 1.8 10/02/2022   AGRATIO 2.3 (H) 10/02/2022   BILITOT 0.8 10/26/2022   ALKPHOS 72 10/26/2022   AST 40 10/26/2022   ALT 38 10/26/2022   ANIONGAP 9 10/26/2022   Last lipids Lab Results  Component Value Date   CHOL 144 06/06/2022   HDL 38 (L) 06/06/2022   LDLCALC 59 06/06/2022   TRIG 298 (H) 06/06/2022   CHOLHDL 3.8 06/06/2022   Last hemoglobin A1c Lab Results  Component Value Date   HGBA1C 6.1 (H) 06/06/2022   Last thyroid functions Lab Results  Component Value Date   TSH 1.530 06/06/2022   Last vitamin B12 and Folate Lab Results  Component Value Date   VITAMINB12 632 06/06/2022   FOLATE 3.5 06/06/2022   The 10-year ASCVD risk score (Arnett DK, et al., 2019) is: 20.2%    Assessment & Plan:   Problem List Items Addressed This Visit       OSA (obstructive sleep apnea) (Chronic)    He endorses nightly compliance with CPAP      COPD (chronic obstructive pulmonary disease) (HCC)    Reports using his albuterol inhaler 2-3 times per week.  Pulmonary exam today is unremarkable.  He is scheduled for follow-up with pulmonology on 5/30.      Idiopathic urticaria    Symptoms continue to improve.  He has started Xolair.  Follow-up with allergy and immunology as scheduled for 5/20.      HLD (hyperlipidemia) - Primary (Chronic)     Rosuvastatin was increased to 20 mg daily in light of lab results from October. -Repeat lipid panel ordered today      Current tobacco use    Reports today that he has resumed smoking 1 pack/day of cigarettes.  This is largely due to pain in his neck and recent events.  He is currently precontemplative with regards to cessation. -Counseled on importance of tobacco cessation.  He is not interested in quitting for now, but will notify us if this changes in the future.       Prediabetes    Noted on labs from October. -Repeat A1c ordered today       Return in about 6 months (around 07/08/2023).    Billie Lade, MD

## 2023-01-05 NOTE — Assessment & Plan Note (Signed)
Reports using his albuterol inhaler 2-3 times per week.  Pulmonary exam today is unremarkable.  He is scheduled for follow-up with pulmonology on 5/30.

## 2023-01-05 NOTE — Assessment & Plan Note (Signed)
Rosuvastatin was increased to 20 mg daily in light of lab results from October. -Repeat lipid panel ordered today

## 2023-01-05 NOTE — Assessment & Plan Note (Signed)
He endorses nightly compliance with CPAP

## 2023-01-05 NOTE — Assessment & Plan Note (Signed)
Symptoms continue to improve.  He has started Xolair.  Follow-up with allergy and immunology as scheduled for 5/20.

## 2023-01-05 NOTE — Assessment & Plan Note (Signed)
Reports today that he has resumed smoking 1 pack/day of cigarettes.  This is largely due to pain in his neck and recent events.  He is currently precontemplative with regards to cessation. -Counseled on importance of tobacco cessation.  He is not interested in quitting for now, but will notify us if this changes in the future.

## 2023-01-05 NOTE — Assessment & Plan Note (Signed)
Noted on labs from October. -Repeat A1c ordered today

## 2023-01-06 LAB — LIPID PANEL
Chol/HDL Ratio: 4 ratio (ref 0.0–5.0)
Cholesterol, Total: 148 mg/dL (ref 100–199)
HDL: 37 mg/dL — ABNORMAL LOW (ref >39)
LDL Chol Calc (NIH): 72 mg/dL (ref 0–99)
Triglycerides: 236 mg/dL — ABNORMAL HIGH (ref 0–149)
VLDL Cholesterol Cal: 39 mg/dL (ref 5–40)

## 2023-01-06 LAB — HEMOGLOBIN A1C
Est. average glucose Bld gHb Est-mCnc: 137 mg/dL
Hgb A1c MFr Bld: 6.4 % — ABNORMAL HIGH (ref 4.8–5.6)

## 2023-01-08 ENCOUNTER — Encounter (INDEPENDENT_AMBULATORY_CARE_PROVIDER_SITE_OTHER): Payer: Self-pay | Admitting: *Deleted

## 2023-01-12 ENCOUNTER — Ambulatory Visit: Payer: PPO | Admitting: Internal Medicine

## 2023-01-13 ENCOUNTER — Ambulatory Visit (HOSPITAL_COMMUNITY)
Admission: RE | Admit: 2023-01-13 | Discharge: 2023-01-13 | Disposition: A | Discharge status: Home / Self Care | Payer: PPO | Source: Ambulatory Visit | Attending: Pulmonary Disease | Admitting: Pulmonary Disease

## 2023-01-13 DIAGNOSIS — J432 Centrilobular emphysema: Secondary | ICD-10-CM | POA: Insufficient documentation

## 2023-01-13 LAB — PULMONARY FUNCTION TEST
DL/VA % pred: 87 %
DL/VA: 3.67 ml/min/mmHg/L
DLCO unc % pred: 72 %
DLCO unc: 17.06 ml/min/mmHg
FEF 25-75 Post: 1.17 L/sec
FEF 25-75 Pre: 1.1 L/sec
FEF2575-%Change-Post: 6 %
FEF2575-%Pred-Post: 48 %
FEF2575-%Pred-Pre: 45 %
FEV1-%Change-Post: 3 %
FEV1-%Pred-Post: 67 %
FEV1-%Pred-Pre: 64 %
FEV1-Post: 2 L
FEV1-Pre: 1.92 L
FEV1FVC-%Change-Post: -4 %
FEV1FVC-%Pred-Pre: 90 %
FEV6-%Change-Post: 1 %
FEV6-%Pred-Post: 76 %
FEV6-%Pred-Pre: 75 %
FEV6-Post: 2.89 L
FEV6-Pre: 2.84 L
FEV6FVC-%Change-Post: -6 %
FEV6FVC-%Pred-Post: 98 %
FEV6FVC-%Pred-Pre: 105 %
FVC-%Change-Post: 8 %
FVC-%Pred-Post: 77 %
FVC-%Pred-Pre: 71 %
FVC-Post: 3.1 L
FVC-Pre: 2.84 L
Post FEV1/FVC ratio: 65 %
Post FEV6/FVC ratio: 93 %
Pre FEV1/FVC ratio: 68 %
Pre FEV6/FVC Ratio: 100 %
RV % pred: 139 %
RV: 2.93 L
TLC % pred: 95 %
TLC: 5.94 L

## 2023-01-13 MED ORDER — ALBUTEROL SULFATE (2.5 MG/3ML) 0.083% IN NEBU
2.5000 mg | INHALATION_SOLUTION | Freq: Once | RESPIRATORY_TRACT | Status: AC
  Administered 2023-01-13: 2.5 mg via RESPIRATORY_TRACT

## 2023-01-19 ENCOUNTER — Encounter: Payer: Self-pay | Admitting: Internal Medicine

## 2023-01-19 ENCOUNTER — Ambulatory Visit (INDEPENDENT_AMBULATORY_CARE_PROVIDER_SITE_OTHER): Payer: PPO | Admitting: Internal Medicine

## 2023-01-19 ENCOUNTER — Ambulatory Visit: Payer: PPO

## 2023-01-19 VITALS — BP 156/62 | HR 66 | Temp 99.2°F | Resp 16 | Ht 66.0 in | Wt 235.4 lb

## 2023-01-19 DIAGNOSIS — L501 Idiopathic urticaria: Secondary | ICD-10-CM | POA: Diagnosis not present

## 2023-01-19 MED ORDER — FEXOFENADINE HCL 180 MG PO TABS: 360.0000 mg | ORAL_TABLET | Freq: Two times a day (BID) | ORAL | 5 refills | Status: DC

## 2023-01-19 MED ORDER — MONTELUKAST SODIUM 10 MG PO TABS
10.0000 mg | ORAL_TABLET | Freq: Every day | ORAL | 5 refills | Status: DC
Start: 2023-01-19 — End: 2023-04-06

## 2023-01-19 MED ORDER — FAMOTIDINE 40 MG PO TABS: 40.0000 mg | ORAL_TABLET | Freq: Two times a day (BID) | ORAL | 5 refills | Status: DC

## 2023-01-19 MED ORDER — HYDROXYZINE PAMOATE 25 MG PO CAPS: 25.0000 mg | ORAL_CAPSULE | Freq: Every evening | ORAL | 1 refills | Status: DC | PRN

## 2023-01-19 NOTE — Progress Notes (Signed)
   FOLLOW UP Date of Service/Encounter:  01/19/23   Subjective:  Christopher [name] (DOB: 04-21-1956) is a 67 y.o. male who [facility] for follow up for idiopathic urticaria.   History obtained from: chart review and patient. Last visit was with Dr. Dellis Anes on 12/03/2022 for idiopathic urticaria and angioedema.  On Allegra, Pepcid, hydroxyzine, Singulair, Claritin, Xolair.  Awaiting patient assistance approval so was given a dose of Xolair sample in clinic.   Since last visit, he reports doing a lot better.  Has not had hives in over 3 weeks.  Xolair has helped tremendously and prior to that was miserable with daily hives.  Taking Allegra 180mg  BID, Pepcid 20mg  BID, Hydroxyzine 25mg  in AM and 50mg  in PM and forgot the Singulair.  He is eating pork without any issues.    Past Medical History: Past Medical History:  Diagnosis Date   Anxiety    Arthritis    Asthma    BPH (benign prostatic hyperplasia)    Complication of anesthesia    pt had a hard time being able to move after spinal anesthesia , 3-4 hours   Depression    GERD (gastroesophageal reflux disease)    Gout    no meds   Headache(784.0)    otc meds prn   Heart murmur    dx as a child, no problems as an adult   History of hiatal hernia    Hyperlipidemia    IBS (irritable bowel syndrome)    PONV (postoperative nausea and vomiting)    Pre-diabetes    Borderline, diet and exercise, no med   Sleep apnea    uses CIPAP machine at night   Wears partial dentures    bottom partial    Objective:  BP (!) 156/62   Pulse 66   Temp 99.2 F (37.3 C) (Temporal)   Resp 16   Ht 5\' 6"  (1.676 m)   Wt 235 lb 6.4 oz (106.8 kg)   SpO2 95%   BMI 37.99 kg/m  Body mass index is 37.99 kg/m. Physical Exam: GEN: alert, well developed HEENT: clear conjunctiva, TM grey and translucent, nose with mild inferior turbinate hypertrophy, pink nasal mucosa, no rhinorrhea, no cobblestoning HEART:  regular rate and rhythm, no murmur LUNGS: clear to auscultation bilaterally, no coughing, unlabored respiration SKIN: no rashes or lesions  Assessment:   1. Idiopathic urticaria     Plan/Recommendations:  Idiopathic Urticaria/Angioedema (Hives/Swelling): - Controlled  - Continue Xolair 300mg  every 4 weeks. Keep Epipen. - Continue with suppressive antihistamine dosing:  - Continue Allegra (Fexofenadine) 360 mg twice a day - Continue Famotidine 40 mg twice a day - Continue Hydroxyzine 25 to 50 mg once at bedtime as needed. Try to stop the hydroxyzine in daytime.  - Continue Singulair (montelukast) 10mg . - Okay to use Sarna lotion. Can place in refrigerator so it has a nice cooling effect when you apply it.  Also moisturize skin twice daily as dry skin itches using a cream such as Cerave, Cetaphil, Eucerin, Aveeno.  - Okay to keep Epipen is only for swelling with laryngeal involvement so if you have trouble breathing and trouble swallowing.  Outside of this, do not use it for hives/other swelling.        Return in about 2 months (around 03/21/2023).  Alesia Morin, MD [facility]

## 2023-01-19 NOTE — Patient Instructions (Addendum)
Idiopathic Urticaria/Angioedema (Hives/Swelling): - Continue Xolair 300mg  every 4 weeks.  Keep Epipen. - Continue with suppressive antihistamine dosing:  - Continue Allegra (Fexofenadine) 360 mg twice a day - Continue Famotidine 40 mg twice a day - Continue Hydroxyzine 25 to 50 mg once at bedtime as needed. Try to stop the hydroxyzine in daytime.  - Continue Singulair (montelukast) 10mg . - Okay to use Sarna lotion. Can place in refrigerator so it has a nice cooling effect when you apply it.  Also moisturize skin twice daily as dry skin itches using a cream such as Cerave, Cetaphil, Eucerin, Aveeno.

## 2023-01-21 ENCOUNTER — Ambulatory Visit: Payer: PPO | Admitting: Orthopedic Surgery

## 2023-01-29 ENCOUNTER — Ambulatory Visit (INDEPENDENT_AMBULATORY_CARE_PROVIDER_SITE_OTHER): Payer: PPO | Admitting: Pulmonary Disease

## 2023-01-29 ENCOUNTER — Encounter: Payer: Self-pay | Admitting: Pulmonary Disease

## 2023-01-29 VITALS — BP 153/71 | HR 64 | Ht 66.0 in | Wt 234.8 lb

## 2023-01-29 DIAGNOSIS — R0609 Other forms of dyspnea: Secondary | ICD-10-CM | POA: Diagnosis not present

## 2023-01-29 DIAGNOSIS — J432 Centrilobular emphysema: Secondary | ICD-10-CM

## 2023-01-29 MED ORDER — STIOLTO RESPIMAT 2.5-2.5 MCG/ACT IN AERS: 2.0000 | INHALATION_SPRAY | Freq: Every day | RESPIRATORY_TRACT | 5 refills | Status: AC

## 2023-01-29 NOTE — Progress Notes (Signed)
[facility]  Chief Complaint  Patient presents with   Follow-up    P f/u states that his breathing has been good, uses a CPAP to sleep.     Past Surgical History:  He  has a past surgical history that includes Knee arthroscopy; Knee arthroscopy; Spinal fusion; neck fusion (2005); Colonoscopy (06/27/2011); Hernia repair (1998); Cardiac catheterization; Chondroplasty (08/15/2011); Lumbar laminectomy/decompression microdiscectomy (09/14/2012); Shoulder surgery (Right); Appendectomy; Back surgery (2002); Shoulder arthroscopy with rotator cuff repair (Left, 05/05/2014); Shoulder open rotator cuff repair (Left, 05/05/2014); Shoulder arthroscopy with bicepstenotomy (Left, 09/23/2013); Colonoscopy (N/A, 10/05/2014); Esophagogastroduodenoscopy (N/A, 12/27/2015); biopsy (12/27/2015); Cardiac catheterization (N/A, 09/04/2016); Total knee arthroplasty (Left, 06/16/2017); Colonoscopy (N/A, 02/11/2018); polypectomy (02/11/2018); Cholecystectomy; Total shoulder arthroplasty (Left, 08/19/2018); Shoulder open rotator cuff repair (Left, 12/16/2018); Revision total shoulder to reverse total shoulder (Left, 12/16/2018); Joint replacement (2023); Esophagogastroduodenoscopy (egd) with propofol (N/A, 02/10/2020); biopsy (02/10/2020); Total knee arthroplasty (Right, 05/14/2021); Patella-femoral arthroplasty (Right, 02/18/2022); Anterior cervical decomp/discectomy fusion (N/A, 10/13/2022); and Neck surgery.  Past Medical History:  Anxiety, OA, BPH, Depression, GERD, Gout, HLD, IBS, Pre DM, Low T  Constitutional:  BP (!) 153/71   Pulse 64   Ht 5\' 6"  (1.676 m)   Wt 234 lb 12.8 oz (106.5 kg)   SpO2 93%   BMI 37.90 kg/m   Brief Summary:  Christopher [name] is a 67 y.o. male former smoker with dyspnea.      Subjective:   He is here with his wife.  PFT showed changes of COPD.  Albuterol helps, but doesn't last.  He gets more short of breath in hot weather.  Gets winded  walking about 200 feet.  Not having leg swelling.  Uses CPAP at night and this helps.  Physical Exam:   Appearance - well kempt   ENMT - no sinus tenderness, no oral exudate, no LAN, Mallampati 3 airway, no stridor  Respiratory - equal breath sounds bilaterally, no wheezing or rales  CV - s1s2 regular rate and rhythm, no murmurs  Ext - no clubbing, no edema  Skin - no rashes  Psych - normal mood and affect    Pulmonary testing:  PFT 01/13/23 >> FEV1 2.00 (67%), FEV1% 65, TLC 5.94 (95%), RV 2.93 (139%), DLCO 72%  Chest Imaging:  LDCT chest 07/24/22 >> coronary calcification, centrilobular and paraseptal emphysema, tiny b/l nodules stable, fatty liver  Sleep Tests:  PSG 07/27/21 >> AHI 48.7, SpO2 low 89%  Cardiac Tests:    Social History:  He  reports that he quit smoking about 5 months ago. His smoking use included cigarettes. He has a 44.00 pack-year smoking history. He has been exposed to tobacco smoke. He has never used smokeless tobacco. He reports that he does not drink alcohol and does not use drugs.  Family History:  His family history includes Alzheimer's disease in his father; Arthritis in an other family member; Diabetes in his father, mother, and another family member; Lung disease in an other family member.     Assessment/Plan:   Dyspnea on exertion. - from COPD and obesity with deconditioning - could also have HFpEF >> will schedule echo to assess further  COPD with emphysema. - will have him try stiolto  - prn albuterol  Obstructive sleep apnea. - he will follow up with Christopher [name] with [facility]  Idiopathic urticaria. - followed by Dr. Dellis [name] with asthma/allergy - on xolair  Time Spent Involved in Patient Care on Day of Examination:  35 minutes  Follow up:   Patient Instructions  Will schedule echocardiogram  Stiolto two puffs daily  Albuterol two puffs every 6 hours as needed for cough, wheeze, chest congestion, or shortness of  breath  Follow up in 3 months  Medication List:   Allergies as of 01/29/2023       Reactions   Celebrex [celecoxib] Itching, Swelling   All over   Codeine Nausea And Vomiting, Other (See Comments)   Extreme stomach pain. This includes anything with the derivative of codeine in it. (Does tolerate hydrocodone)   Cortisone Swelling   SWELLING REACTION UNSPECIFIED    Doxycycline Swelling, Other (See Comments)   Made tongue turn black    Medrol [methylprednisolone] Hives, Swelling   Angioedema   Prednisone Swelling   SWELLING REACTION UNSPECIFIED    Relafen [nabumetone] Swelling   SWELLING REACTION UNSPECIFIED    Aspirin Other (See Comments)   Stomach cramps   Oxycodone-acetaminophen Itching, Other (See Comments)   Can tolerate with benadryl    Raloxifene Rash   Rofecoxib Rash        Medication List        Accurate as of Jan 29, 2023  3:21 PM. If you have any questions, ask your nurse or doctor.          cyclobenzaprine 10 MG tablet Commonly known as: FLEXERIL Take 1 tablet (10 mg total) by mouth 3 (three) times daily as needed for muscle spasms. Do not take with Robaxin   EPINEPHrine 0.3 mg/0.3 mL Soaj injection Commonly known as: EPI-PEN Inject 0.3 mg into the muscle as needed for anaphylaxis.   famotidine 40 MG tablet Commonly known as: Pepcid Take 1 tablet (40 mg total) by mouth 2 (two) times daily.   fexofenadine 180 MG tablet Commonly known as: Allegra Allergy Take 2 tablets (360 mg total) by mouth in the morning and at bedtime.   HYDROcodone-acetaminophen 5-325 MG tablet Commonly known as: NORCO/VICODIN Take 1 tablet by mouth every 4 (four) hours as needed for moderate pain.   hydrOXYzine 25 MG capsule Commonly known as: VISTARIL Take 1 capsule (25 mg total) by mouth at bedtime as needed for itching (hives).   hydrOXYzine 50 MG tablet Commonly known as: ATARAX Take 1 tablet (50 mg total) by mouth at bedtime as needed for itching (hives).    loratadine 10 MG tablet Commonly known as: Claritin Take 1 tablet (10 mg total) by mouth 2 (two) times daily. Follow up with your PCP or allergist to discuss this med further   meloxicam 7.5 MG tablet Commonly known as: MOBIC Take 7.5 mg by mouth 2 (two) times daily.   montelukast 10 MG tablet Commonly known as: Singulair Take 1 tablet (10 mg total) by mouth at bedtime.   nitroGLYCERIN 0.4 MG SL tablet Commonly known as: NITROSTAT PLACE (1) TABLET UNDER TONGUE EVERY 5 MINUTES UP TO (3) DOSES. IF NO RELIEF CALL 911.   pantoprazole 40 MG tablet Commonly known as: PROTONIX TAKE (1) TABLET TWICE A DAY BEFORE MEALS. What changed: See the new instructions.   ProAir HFA 108 (90 Base) MCG/ACT inhaler Generic drug: albuterol Inhale 2 puffs into the lungs every 6 (six) hours as needed for wheezing or shortness of breath.   rosuvastatin 20 MG tablet Commonly known as: CRESTOR TAKE 1 TABLET BY MOUTH ONCE DAILY.   sertraline 50 MG tablet Commonly known as: ZOLOFT Take 50 mg by mouth in the morning.   Stiolto Respimat 2.5-2.5 MCG/ACT Aers Generic drug: Tiotropium Bromide-Olodaterol Inhale 2  puffs into the lungs daily. Started by: Coralyn Helling, MD   Testosterone 20.25 MG/1.25GM (1.62%) Gel Commonly known as: AndroGel Apply 2 packets topically daily.        Signature:  Coralyn Helling, MD [facility] Pager - [phone] 01/29/2023, 3:21 PM

## 2023-01-29 NOTE — Patient Instructions (Signed)
Will schedule echocardiogram  Stiolto two puffs daily  Albuterol two puffs every 6 hours as needed for cough, wheeze, chest congestion, or shortness of breath  Follow up in 3 months

## 2023-01-31 ENCOUNTER — Other Ambulatory Visit: Payer: Self-pay | Admitting: Internal Medicine

## 2023-01-31 DIAGNOSIS — J449 Chronic obstructive pulmonary disease, unspecified: Secondary | ICD-10-CM

## 2023-02-02 ENCOUNTER — Ambulatory Visit: Payer: PPO

## 2023-02-06 ENCOUNTER — Ambulatory Visit: Payer: PPO | Admitting: Internal Medicine

## 2023-02-11 ENCOUNTER — Other Ambulatory Visit: Payer: PPO

## 2023-02-11 ENCOUNTER — Ambulatory Visit: Payer: PPO | Admitting: Urology

## 2023-02-11 DIAGNOSIS — E291 Testicular hypofunction: Secondary | ICD-10-CM

## 2023-02-14 LAB — COMPREHENSIVE METABOLIC PANEL
ALT: 17 IU/L (ref 0–44)
AST: 16 IU/L (ref 0–40)
Albumin/Globulin Ratio: 2.1
Albumin: 4.2 g/dL (ref 3.9–4.9)
Alkaline Phosphatase: 92 IU/L (ref 44–121)
BUN/Creatinine Ratio: 16 (ref 10–24)
BUN: 14 mg/dL (ref 8–27)
Bilirubin Total: 0.4 mg/dL (ref 0.0–1.2)
CO2: 20 mmol/L (ref 20–29)
Calcium: 9.2 mg/dL (ref 8.6–10.2)
Chloride: 102 mmol/L (ref 96–106)
Creatinine, Ser: 0.85 mg/dL (ref 0.76–1.27)
Globulin, Total: 2 g/dL (ref 1.5–4.5)
Glucose: 156 mg/dL — ABNORMAL HIGH (ref 70–99)
Potassium: 4.4 mmol/L (ref 3.5–5.2)
Sodium: 137 mmol/L (ref 134–144)
Total Protein: 6.2 g/dL (ref 6.0–8.5)
eGFR: 96 mL/min/{1.73_m2} (ref >59)

## 2023-02-14 LAB — ESTRADIOL: Estradiol: 37.5 pg/mL (ref 7.6–42.6)

## 2023-02-14 LAB — CBC
Hematocrit: 42.5 % (ref 37.5–51.0)
Hemoglobin: 14.1 g/dL (ref 13.0–17.7)
MCH: 26.9 pg (ref 26.6–33.0)
MCHC: 33.2 g/dL (ref 31.5–35.7)
MCV: 81 fL (ref 79–97)
Platelets: 240 10*3/uL (ref 150–450)
RBC: 5.24 x10E6/uL (ref 4.14–5.80)
RDW: 14.2 % (ref 11.6–15.4)
WBC: 8 10*3/uL (ref 3.4–10.8)

## 2023-02-14 LAB — TESTOSTERONE,FREE AND TOTAL
Testosterone, Free: 6.3 pg/mL — ABNORMAL LOW (ref 6.6–18.1)
Testosterone: 200 ng/dL — ABNORMAL LOW (ref 264–916)

## 2023-02-17 ENCOUNTER — Telehealth: Payer: Self-pay | Admitting: Orthopedic Surgery

## 2023-02-17 ENCOUNTER — Ambulatory Visit (INDEPENDENT_AMBULATORY_CARE_PROVIDER_SITE_OTHER): Payer: PPO | Admitting: Urology

## 2023-02-17 VITALS — BP 155/71 | HR 62

## 2023-02-17 DIAGNOSIS — E291 Testicular hypofunction: Secondary | ICD-10-CM | POA: Diagnosis not present

## 2023-02-17 DIAGNOSIS — R350 Frequency of micturition: Secondary | ICD-10-CM | POA: Diagnosis not present

## 2023-02-17 DIAGNOSIS — N138 Other obstructive and reflux uropathy: Secondary | ICD-10-CM | POA: Diagnosis not present

## 2023-02-17 DIAGNOSIS — N401 Enlarged prostate with lower urinary tract symptoms: Secondary | ICD-10-CM

## 2023-02-17 MED ORDER — JATENZO 158 MG PO CAPS
1.0000 | ORAL_CAPSULE | Freq: Two times a day (BID) | ORAL | 3 refills | Status: DC
Start: 2023-02-17 — End: 2023-02-23

## 2023-02-17 NOTE — Telephone Encounter (Signed)
Called patient's wife Barbette Or left message to call back to schedule an appointment  with Hazle Nordmann for right knee

## 2023-02-17 NOTE — Progress Notes (Signed)
02/17/2023 3:02 PM   Allen Kell 06-28-1956 [id number]  Referring provider: Billie Lade, MD [address] [place],  [place] [address]  Followup hypogonadism and BPH  HPI: Mr Crooker is a 67yo here for followup for hypogonadism. Testosterone 200, hgb 14.1 on androgel 2 packets daily. He has decreased energy. Decreased libido. IPSS 12 QOL 2 on no therapy.  Urine stream fair. No straining to urinate. Nocturia 1-3x. No other complaints today   PMH: Past Medical History:  Diagnosis Date   Anxiety    Arthritis    Asthma    BPH (benign prostatic hyperplasia)    Complication of anesthesia    pt had a hard time being able to move after spinal anesthesia , 3-4 hours   Depression    GERD (gastroesophageal reflux disease)    Gout    no meds   Headache(784.0)    otc meds prn   Heart murmur    dx as a child, no problems as an adult   History of hiatal hernia    Hyperlipidemia    IBS (irritable bowel syndrome)    PONV (postoperative nausea and vomiting)    Pre-diabetes    Borderline, diet and exercise, no med   Sleep apnea    uses CIPAP machine at night   Wears partial dentures    bottom partial    Surgical History: Past Surgical History:  Procedure Laterality Date   ANTERIOR CERVICAL DECOMP/DISCECTOMY FUSION N/A 10/13/2022   Procedure: ANTERIOR CERVICAL DISECTOMY FUSION - CERVICAL THREE-CERVICAL FOUR - CERVICAL FOUR-CERVICAL FIVE REMOVAL OF HARDWARE CERVICAL FIVE-CERVICAL SIX;  Surgeon: Donalee Citrin, MD;  Location: [facility];  Service: Neurosurgery;  Laterality: N/A;   APPENDECTOMY     BACK SURGERY  2002   neck and back fusion   BIOPSY  12/27/2015   Procedure: BIOPSY;  Surgeon: Malissa Hippo, MD;  [facility]: [facility];  Service: Endoscopy;;  Fundus biopsies and duodenal biopsies   BIOPSY  02/10/2020   Procedure: BIOPSY;  Surgeon: Malissa Hippo, MD;  [facility];  Service: Endoscopy;;  antral   CARDIAC CATHETERIZATION     CARDIAC CATHETERIZATION  N/A 09/04/2016   Procedure: Right/Left Heart Cath and Coronary Angiography;  Surgeon: Peter M Swaziland, MD;  Location: [facility] INVASIVE CV LAB;  Service: Cardiovascular;  Laterality: N/A;   CHOLECYSTECTOMY     CHONDROPLASTY  08/15/2011   Procedure: CHONDROPLASTY;  Surgeon: Fuller Canada, MD;  Location: [facility] ORS;  Service: Orthopedics;  Laterality: Left;   COLONOSCOPY  06/27/2011   Procedure: COLONOSCOPY;  Surgeon: Malissa Hippo, MD;  [facility];  Service: Endoscopy;  Laterality: N/A;  9:00 / Pt to be here at 9am for 10:45 procedure, benign polyps removed   COLONOSCOPY N/A 10/05/2014   Procedure: COLONOSCOPY;  Surgeon: Malissa Hippo, MD;  [facility];  Service: Endoscopy;  Laterality: N/A;  930   COLONOSCOPY N/A 02/11/2018   Procedure: COLONOSCOPY;  Surgeon: Malissa Hippo, MD;  [facility];  Service: Endoscopy;  Laterality: N/A;  830   ESOPHAGOGASTRODUODENOSCOPY N/A 12/27/2015   Procedure: ESOPHAGOGASTRODUODENOSCOPY (EGD);  Surgeon: Malissa Hippo, MD;  [facility];  Service: Endoscopy;  Laterality: N/A;  3:00   ESOPHAGOGASTRODUODENOSCOPY (EGD) WITH PROPOFOL N/A 02/10/2020   Procedure: ESOPHAGOGASTRODUODENOSCOPY (EGD) WITH PROPOFOL;  Surgeon: Malissa Hippo, MD;  [facility];  Service: Endoscopy;  Laterality: N/A;  155   HERNIA REPAIR  1998  umbilical hernia   JOINT REPLACEMENT  2023   knee and shoulder   KNEE ARTHROSCOPY     left knee   KNEE ARTHROSCOPY     right knee    LUMBAR LAMINECTOMY/DECOMPRESSION MICRODISCECTOMY  09/14/2012   Procedure: LUMBAR LAMINECTOMY/DECOMPRESSION MICRODISCECTOMY 1 LEVEL;  Surgeon: Karn Cassis, MD;  Location: [facility];  Service: Neurosurgery;  Laterality: Right;  Right Lumbar three-four Diskectomy   neck fusion  2005   NECK SURGERY     PATELLA-FEMORAL ARTHROPLASTY Right 02/18/2022   Procedure: RIGHT KNEE PATELLA REPLACEMENT, CEMENTED;  Surgeon: Cammy Copa, MD;  Location: [facility];   Service: Orthopedics;  Laterality: Right;   POLYPECTOMY  02/11/2018   Procedure: POLYPECTOMY;  Surgeon: Malissa Hippo, MD;  [facility];  Service: Endoscopy;;  colon   REVISION TOTAL SHOULDER TO REVERSE TOTAL SHOULDER Left 12/16/2018   Procedure: REVISION TOTAL SHOULDER TO REVERSE TOTAL SHOULDER;  Surgeon: Cammy Copa, MD;  Location: [facility];  Service: Orthopedics;  Laterality: Left;   SHOULDER ARTHROSCOPY WITH BICEPSTENOTOMY Left 09/23/2013   Procedure: SHOULDER ARTHROSCOPY WITH BICEPSTENOTOMY AND EXTENSIVE DEBRIDEMENT;  Surgeon: Vickki Hearing, MD;  Location: [facility];  Service: Orthopedics;  Laterality: Left;   SHOULDER ARTHROSCOPY WITH ROTATOR CUFF REPAIR Left 05/05/2014   Procedure: SHOULDER ARTHROSCOPY LIMITED DEBRIDEMENT;  Surgeon: Vickki Hearing, MD;  Location: [facility];  Service: Orthopedics;  Laterality: Left;   SHOULDER OPEN ROTATOR CUFF REPAIR Left 05/05/2014   Procedure: ROTATOR CUFF REPAIR SHOULDER OPEN;  Surgeon: Vickki Hearing, MD;  Location: [facility];  Service: Orthopedics;  Laterality: Left;   SHOULDER OPEN ROTATOR CUFF REPAIR Left 12/16/2018   Procedure: LEFT SHOULDER POSSIBLE SUBSCAPULARIS REPAIR VS. REVISION TO REVERSE TOTAL SHOULDER REPLACEMENT;  Surgeon: Cammy Copa, MD;  Location: [facility];  Service: Orthopedics;  Laterality: Left;   SHOULDER SURGERY Right    Open Mumford procedure   SPINAL FUSION     x2, 2003 and 2007   TOTAL KNEE ARTHROPLASTY Left 06/16/2017   Procedure: LEFT TOTAL KNEE ARTHROPLASTY;  Surgeon: Vickki Hearing, MD;  Location: [facility];  Service: Orthopedics;  Laterality: Left;   TOTAL KNEE ARTHROPLASTY Right 05/14/2021   Procedure: TOTAL KNEE ARTHROPLASTY;  Surgeon: Vickki Hearing, MD;  Location: [facility];  Service: Orthopedics;  Laterality: Right;   TOTAL SHOULDER ARTHROPLASTY Left 08/19/2018   Procedure: left shoulder replacement;  Surgeon: Cammy Copa, MD;  Location[facility] OR;  Service: Orthopedics;   Laterality: Left;    Home Medications:  Allergies as of 02/17/2023       Reactions   Celebrex [celecoxib] Itching, Swelling   All over   Codeine Nausea And Vomiting, Other (See Comments)   Extreme stomach pain. This includes anything with the derivative of codeine in it. (Does tolerate hydrocodone)   Cortisone Swelling   SWELLING REACTION UNSPECIFIED    Doxycycline Swelling, Other (See Comments)   Made tongue turn black    Medrol [methylprednisolone] Hives, Swelling   Angioedema   Prednisone Swelling   SWELLING REACTION UNSPECIFIED    Relafen [nabumetone] Swelling   SWELLING REACTION UNSPECIFIED    Aspirin Other (See Comments)   Stomach cramps   Oxycodone-acetaminophen Itching, Other (See Comments)   Can tolerate with benadryl    Raloxifene Rash   Rofecoxib Rash        Medication List        Accurate as of February 17, 2023  3:02 PM. If you have any questions, ask your  nurse or doctor.          albuterol 108 (90 Base) MCG/ACT inhaler Commonly known as: VENTOLIN HFA INHALE 2 PUFFS INTO LUNGS EVERY 6 HOURS AS NEEDED FOR WHEEZING OR SHORTNESS OF BREATH   cyclobenzaprine 10 MG tablet Commonly known as: FLEXERIL Take 1 tablet (10 mg total) by mouth 3 (three) times daily as needed for muscle spasms. Do not take with Robaxin   EPINEPHrine 0.3 mg/0.3 mL Soaj injection Commonly known as: EPI-PEN Inject 0.3 mg into the muscle as needed for anaphylaxis.   famotidine 40 MG tablet Commonly known as: Pepcid Take 1 tablet (40 mg total) by mouth 2 (two) times daily.   fexofenadine 180 MG tablet Commonly known as: Allegra Allergy Take 2 tablets (360 mg total) by mouth in the morning and at bedtime.   HYDROcodone-acetaminophen 5-325 MG tablet Commonly known as: NORCO/VICODIN Take 1 tablet by mouth every 4 (four) hours as needed for moderate pain.   hydrOXYzine 25 MG capsule Commonly known as: VISTARIL Take 1 capsule (25 mg total) by mouth at bedtime as needed for itching  (hives).   hydrOXYzine 50 MG tablet Commonly known as: ATARAX Take 1 tablet (50 mg total) by mouth at bedtime as needed for itching (hives).   loratadine 10 MG tablet Commonly known as: Claritin Take 1 tablet (10 mg total) by mouth 2 (two) times daily. Follow up with your PCP or allergist to discuss this med further   meloxicam 7.5 MG tablet Commonly known as: MOBIC Take 7.5 mg by mouth 2 (two) times daily.   montelukast 10 MG tablet Commonly known as: Singulair Take 1 tablet (10 mg total) by mouth at bedtime.   nitroGLYCERIN 0.4 MG SL tablet Commonly known as: NITROSTAT PLACE (1) TABLET UNDER TONGUE EVERY 5 MINUTES UP TO (3) DOSES. IF NO RELIEF CALL 911.   pantoprazole 40 MG tablet Commonly known as: PROTONIX TAKE (1) TABLET TWICE A DAY BEFORE MEALS. What changed: See the new instructions.   rosuvastatin 20 MG tablet Commonly known as: CRESTOR TAKE 1 TABLET BY MOUTH ONCE DAILY.   sertraline 50 MG tablet Commonly known as: ZOLOFT Take 50 mg by mouth in the morning.   Stiolto Respimat 2.5-2.5 MCG/ACT Aers Generic drug: Tiotropium Bromide-Olodaterol Inhale 2 puffs into the lungs daily.   Testosterone 20.25 MG/1.25GM (1.62%) Gel Commonly known as: AndroGel Apply 2 packets topically daily.        Allergies:  Allergies  Allergen Reactions   Celebrex [Celecoxib] Itching and Swelling    All over   Codeine Nausea And Vomiting and Other (See Comments)    Extreme stomach pain. This includes anything with the derivative of codeine in it. (Does tolerate hydrocodone)   Cortisone Swelling    SWELLING REACTION UNSPECIFIED    Doxycycline Swelling and Other (See Comments)    Made tongue turn black    Medrol [Methylprednisolone] Hives and Swelling    Angioedema    Prednisone Swelling    SWELLING REACTION UNSPECIFIED    Relafen [Nabumetone] Swelling    SWELLING REACTION UNSPECIFIED    Aspirin Other (See Comments)    Stomach cramps   Oxycodone-Acetaminophen Itching and  Other (See Comments)    Can tolerate with benadryl    Raloxifene Rash   Rofecoxib Rash    Family History: Family History  Problem Relation Age of Onset   Diabetes Mother    Alzheimer's disease Father    Diabetes Father    Diabetes Other    Lung disease Other  Arthritis Other    Anesthesia problems Neg Hx    Hypotension Neg Hx    Malignant hyperthermia Neg Hx    Pseudochol deficiency Neg Hx    Sleep apnea Neg Hx     Social History:  reports that he quit smoking about 5 months ago. His smoking use included cigarettes. He has a 44.00 pack-year smoking history. He has been exposed to tobacco smoke. He has never used smokeless tobacco. He reports that he does not drink alcohol and does not use drugs.  ROS: All other review of systems were reviewed and are negative except what is noted above in HPI  Physical Exam: BP (!) 155/71   Pulse 62   Constitutional:  Alert and oriented, No acute distress. HEENT: [place] AT, moist mucus membranes.  Trachea midline, no masses. Cardiovascular: No clubbing, cyanosis, or edema. Respiratory: Normal respiratory effort, no increased work of breathing. GI: Abdomen is soft, nontender, nondistended, no abdominal masses GU: No CVA tenderness.  Lymph: No cervical or inguinal lymphadenopathy. Skin: No rashes, bruises or suspicious lesions. Neurologic: Grossly intact, no focal deficits, moving all 4 extremities. Psychiatric: Normal mood and affect.  Laboratory Data: Lab Results  Component Value Date   WBC 8.0 02/11/2023   HGB 14.1 02/11/2023   HCT 42.5 02/11/2023   MCV 81 02/11/2023   PLT 240 02/11/2023    Lab Results  Component Value Date   CREATININE 0.85 02/11/2023    No results found for: "PSA"  Lab Results  Component Value Date   TESTOSTERONE 200 (L) 02/11/2023    Lab Results  Component Value Date   HGBA1C 6.4 (H) 01/05/2023    Urinalysis    Component Value Date/Time   COLORURINE YELLOW 02/11/2022 1008   APPEARANCEUR Clear  07/11/2022 0916   LABSPEC 1.012 02/11/2022 1008   PHURINE 6.0 02/11/2022 1008   GLUCOSEU Negative 07/11/2022 0916   HGBUR NEGATIVE 02/11/2022 1008   BILIRUBINUR negative 10/22/2022 1017   BILIRUBINUR Negative 07/11/2022 0916   KETONESUR negative 10/22/2022 1017   KETONESUR NEGATIVE 02/11/2022 1008   PROTEINUR Negative 07/11/2022 0916   PROTEINUR NEGATIVE 02/11/2022 1008   UROBILINOGEN 0.2 10/22/2022 1017   NITRITE Negative 10/22/2022 1017   NITRITE Negative 07/11/2022 0916   NITRITE NEGATIVE 02/11/2022 1008   LEUKOCYTESUR Negative 10/22/2022 1017   LEUKOCYTESUR Negative 07/11/2022 0916   LEUKOCYTESUR NEGATIVE 02/11/2022 1008    Lab Results  Component Value Date   LABMICR See below: 07/11/2022   WBCUA 0-5 07/11/2022   LABEPIT None seen 07/11/2022   BACTERIA None seen 07/11/2022    Pertinent Imaging:  Results for orders placed during the hospital encounter of 02/19/10  DG Abd 1 View  Narrative Clinical Data: Vomiting and abdominal pain.  Status post lumbar spine surgery 2 days ago.  ABDOMEN - 1 VIEW  Comparison: None.  Findings: Mild gaseous distention of the colon with scattered air fluid levels, including the rectum.  Surgical clips in the right upper and lower abdomen.  Laminectomy defects, interbody spacer and pedicle screw and rod fixation at the L4-5 level.  Minimal dextroconvex lumbar scoliosis.  IMPRESSION: Mild colonic ileus.  Provider: Roselee Culver  No results found for this or any previous visit.  No results found for this or any previous visit.  No results found for this or any previous visit.  No results found for this or any previous visit.  No valid procedures specified. No results found for this or any previous visit.  No results found for this or  any previous visit.   Assessment & Plan:    1. Hypogonadism in male Jatenzo 158mg  BID Followup 3 months with testosterone labs - Urinalysis, Routine w reflex microscopic  2. Benign  prostatic hyperplasia with urinary obstruction Patient defers therapy at this time  3. Frequency of micturition -patient defers therapy at this time   No follow-ups on file.  Wilkie Aye, MD  [facility] [place]

## 2023-02-17 NOTE — Telephone Encounter (Signed)
Patient's wife called advised patient fell hurting his right knee Saturday. She advised that the pain gets worse as the day goes on. The swelling has gone down per patient's wife.   Genevieve asked if patient can be worked into Dr. Diamantina Providence schedule tomorrow. The number to contact Barbette Or is [phone]

## 2023-02-18 ENCOUNTER — Telehealth: Payer: Self-pay

## 2023-02-18 LAB — URINALYSIS, ROUTINE W REFLEX MICROSCOPIC
Bilirubin, UA: NEGATIVE
Glucose, UA: NEGATIVE
Ketones, UA: NEGATIVE
Leukocytes,UA: NEGATIVE
Nitrite, UA: NEGATIVE
Protein,UA: NEGATIVE
Specific Gravity, UA: 1.025 (ref 1.005–1.030)
Urobilinogen, Ur: 1 mg/dL (ref 0.2–1.0)
pH, UA: 6 (ref 5.0–7.5)

## 2023-02-18 LAB — MICROSCOPIC EXAMINATION: Bacteria, UA: NONE SEEN

## 2023-02-18 NOTE — Telephone Encounter (Signed)
PA completed today Key: [id number]

## 2023-02-18 NOTE — Telephone Encounter (Signed)
Patient called advising that [facility] stated the below medication needed a prior authorization. They advised they sent a request to our office.   Medication: Testosterone Undecanoate (JATENZO) 158 MG CAPS

## 2023-02-19 NOTE — Telephone Encounter (Signed)
Approval placed in chart under media

## 2023-02-20 ENCOUNTER — Ambulatory Visit (INDEPENDENT_AMBULATORY_CARE_PROVIDER_SITE_OTHER): Payer: PPO

## 2023-02-20 DIAGNOSIS — L501 Idiopathic urticaria: Secondary | ICD-10-CM | POA: Diagnosis not present

## 2023-02-20 NOTE — Telephone Encounter (Signed)
Wife left voice message 02-20-2023.  Wanting to know the status of the prior authorization for new Rx  Testosterone Undecanoate (JATENZO) 158 MG CAPS   Please advise.

## 2023-02-20 NOTE — Telephone Encounter (Signed)
Jatenzo approved. Pt informed of approval

## 2023-02-23 ENCOUNTER — Other Ambulatory Visit: Payer: Self-pay

## 2023-02-23 NOTE — Telephone Encounter (Signed)
Wife requesting Brooks Sailors rx be sent to [facility].  Pharmacy updated can you please resend attached script.

## 2023-02-23 NOTE — Telephone Encounter (Signed)
Already sent to MD, waiting for him to resend.  Thanks

## 2023-02-23 NOTE — Telephone Encounter (Signed)
Wife of patient called to let us know the Testosterone Undecanoate (JATENZO) 158 MG CAPS cannot be filled at [facility] pharmacy.  They do not carry the Rx.  Wife is requesting the Rx be called into: [facility] - [place], [place] - [id number] Phone: [phone]  Fax: [phone]      As soon as possible.

## 2023-02-24 ENCOUNTER — Encounter: Payer: Self-pay | Admitting: Urology

## 2023-02-24 MED ORDER — JATENZO 158 MG PO CAPS: 1.0000 | ORAL_CAPSULE | Freq: Two times a day (BID) | ORAL | 3 refills | Status: DC

## 2023-02-24 NOTE — Patient Instructions (Signed)
Hypogonadism, Male  Male hypogonadism is a condition of having a level of testosterone that is lower than normal. Testosterone is a chemical, or hormone, that is made mainly in the testicles. In boys, testosterone is responsible for the development of male characteristics during puberty. These include: Making the penis bigger. Growing and building the muscles. Growing facial hair. Deepening the voice. In adult men, testosterone is responsible for maintaining: An interest in sex and the ability to have sex. Muscle mass. Sperm production. Red blood cell production. Bone strength. Testosterone also gives men energy and a sense of well-being. Testosterone normally decreases as men age and the testicles make less testosterone. Testosterone levels can vary from man to man. Not all men will have signs and symptoms of low testosterone. Weight, alcohol use, medicines, and certain medical conditions can affect a man's testosterone level. What are the causes? This condition is caused by: A natural decrease in testosterone that occurs as a man grows older. This is the main cause of this condition. Use of medicines, such as antidepressants, steroids, and opioids. Diseases and conditions that affect the testicles or the making of testosterone. These include: Injury or damage to the testicles from trauma, cancer, cancer treatment, or infection. Diabetes. Sleep apnea. Genetic conditions that men are born with. Disease of the pituitary gland. This gland is in the brain. It produces hormones. Obesity. Metabolic syndrome. This is a group of diseases that affect blood pressure, blood sugar, cholesterol, and belly fat. HIV or AIDS. Alcohol abuse. Kidney failure. Other long-term or chronic diseases. What are the signs or symptoms? Common symptoms of this condition include: Loss of interest in sex (low sex drive). Inability to have or maintain an erection (erectile dysfunction). Feeling tired  (fatigue). Mood changes, like irritability or depression. Loss of muscle and body hair. Infertility. Large breasts. Weight gain (obesity). How is this diagnosed? Your health care provider can diagnose hypogonadism based on: Your signs and symptoms. A physical exam to check your testosterone levels. This includes blood tests. Testosterone levels can change throughout the day. Levels are highest in the morning. You may need to have repeat blood tests before getting a diagnosis of hypogonadism. Depending on your medical history and test results, your health care provider may also do other tests to find the cause of low testosterone. How is this treated? This condition is treated with testosterone replacement therapy. Testosterone can be given by: Injection or through pellets inserted under the skin. Gels or patches placed on the skin or in the mouth. Testosterone therapy is not for everyone. It has risks and side effects. Your health care provider will consider your medical history, your risk for prostate cancer, your age, and your symptoms before putting you on testosterone replacement therapy. Follow these instructions at home: Take over-the-counter and prescription medicines only as told by your health care provider. Eat foods that are high in fiber, such as beans, whole grains, and fresh fruits and vegetables. Limit foods that are high in fat and processed sugars, such as fried or sweet foods. If you drink alcohol: Limit how much you have to 0-2 drinks a day. Know how much alcohol is in your drink. In the [place] one drink equals one 12 oz bottle of beer (355 mL), one 5 oz glass of wine (148 mL), or one 1 oz glass of hard liquor (44 mL). Return to your normal activities as told by your health care provider. Ask your health care provider what activities are safe for you. Keep all   follow-up visits. This is important. Contact a health care provider if: You have any of the signs or symptoms of  low testosterone. You have any side effects from testosterone therapy. Summary Male hypogonadism is a condition of having a level of testosterone that is lower than normal. The natural drop in testosterone production that occurs with age is the most common cause of this condition. Low testosterone can also be caused by many diseases and conditions that affect the testicles and the making of testosterone. This condition is treated with testosterone replacement therapy. There are risks and side effects of testosterone therapy. Your health care provider will consider your age, medical history, symptoms, and risks for prostate cancer before putting you on testosterone therapy. This information is not intended to replace advice given to you by your health care provider. Make sure you discuss any questions you have with your health care provider. Document Revised: 04/19/2020 Document Reviewed: 04/19/2020 Elsevier Patient Education  2024 Else[employer] Inc.  

## 2023-03-03 ENCOUNTER — Other Ambulatory Visit: Payer: Self-pay | Admitting: Student

## 2023-03-03 DIAGNOSIS — M5412 Radiculopathy, cervical region: Secondary | ICD-10-CM

## 2023-03-04 ENCOUNTER — Other Ambulatory Visit: Payer: Self-pay | Admitting: Internal Medicine

## 2023-03-04 DIAGNOSIS — E782 Mixed hyperlipidemia: Secondary | ICD-10-CM

## 2023-03-04 DIAGNOSIS — J449 Chronic obstructive pulmonary disease, unspecified: Secondary | ICD-10-CM

## 2023-03-09 ENCOUNTER — Ambulatory Visit: Payer: PPO | Admitting: Orthopedic Surgery

## 2023-03-09 ENCOUNTER — Other Ambulatory Visit (INDEPENDENT_AMBULATORY_CARE_PROVIDER_SITE_OTHER): Payer: PPO

## 2023-03-09 DIAGNOSIS — M25561 Pain in right knee: Secondary | ICD-10-CM

## 2023-03-10 ENCOUNTER — Encounter: Payer: Self-pay | Admitting: Orthopedic Surgery

## 2023-03-10 NOTE — Progress Notes (Signed)
Office Visit Note   Patient: Christopher [name]           Date of Birth: 04-Apr-1956           MRN: [id number] Visit Date: 03/09/2023 Requested by: Billie Lade, MD [address] [place],  [place] [address] PCP: Billie Lade, MD  Subjective: Chief Complaint  Patient presents with   Right Knee - Pain    HPI: Christopher [name] is a 67 y.o. male who presents to the office reporting right knee pain.  Had an axial loading injury about 2 weeks ago.  Pain has gradually been getting better but still has some pain along the tibial region.  He is years out from total knee replacement on the right..                ROS: All systems reviewed are negative as they relate to the chief complaint within the history of present illness.  Patient denies fevers or chills.  Assessment & Plan: Visit Diagnoses:  1. Right knee pain, unspecified chronicity     Plan: Impression is axial loading injury to the right knee with no apparent ill effects.  No fracture and no knee effusion is present.  Plan is observation for now.  If symptoms do not improve over the next 4 weeks we could consider CT scanning.  Follow-up as needed.  Follow-Up Instructions: No follow-ups on file.   Orders:  Orders Placed This Encounter  Procedures   XR Knee 1-2 Views Right   No orders of the defined types were placed in this encounter.     Procedures: No procedures performed   Clinical Data: No additional findings.  Objective: Vital Signs: There were no vitals taken for this visit.  Physical Exam:  Constitutional: Patient appears well-developed HEENT:  Head: Normocephalic Eyes:EOM are normal Neck: Normal range of motion Cardiovascular: Normal rate Pulmonary/chest: Effort normal Neurologic: Patient is alert Skin: Skin is warm Psychiatric: Patient has normal mood and affect  Ortho Exam: Ortho exam demonstrates excellent range of motion of the right knee from full extension to about 105 of flexion.   Extensor mechanism intact.  No masses lymphadenopathy or skin changes noted in that right knee region.  Specialty Comments:  No specialty comments available.  Imaging: No results found.   PMFS History: Patient Active Problem List   Diagnosis Date Noted   Prediabetes 01/05/2023   History of anaphylaxis 11/13/2022   Idiopathic urticaria 11/03/2022   Angioedema 10/24/2022   Hives 10/24/2022   Spinal stenosis in cervical region 10/13/2022   DOE (dyspnea on exertion) 09/26/2022   Abnormal EKG 09/18/2022   Hypogonadism in male 07/07/2022   Preop cardiovascular exam 07/07/2022   Depression, recurrent (HCC) 06/06/2022   Chronic musculoskeletal pain 06/06/2022   Fatigue 06/06/2022   Current tobacco use 06/06/2022   Encounter to establish care 06/06/2022   S/P right knee surgery 02/18/2022   S/P TKR (total knee replacement), right 05/14/21 07/02/2021   Status post total right knee replacement 05/14/2021   Primary osteoarthritis of right knee    Renal cyst 10/03/2020   Frequency of micturition 10/03/2020   Benign prostatic hyperplasia with urinary obstruction 10/03/2020   IBS (irritable bowel syndrome) 07/05/2020   Lumbar disc herniation 06/29/2019   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HCC)    Primary osteoarthritis, left shoulder    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   History of colonic polyps 11/26/2017   S/P total knee replacement, left 06/16/17 06/16/2017  Primary osteoarthritis of left knee    HLD (hyperlipidemia) 09/04/2016   Angina pectoris (HCC) 09/04/2016   OSA (obstructive sleep apnea) 09/04/2016   COPD (chronic obstructive pulmonary disease) (HCC) 09/04/2016   Rotator cuff tear 05/05/2014   S/P shoulder surgery 09/26/2013   Arthritis, shoulder region 09/26/2013   Bursitis, shoulder 09/26/2013   Synovitis of shoulder 09/26/2013   Labral tear of shoulder, degenerative 09/26/2013   Biceps tendon tear 09/26/2013   Rotator cuff syndrome of left shoulder 06/21/2013    Arthritis 06/21/2013   Patellofemoral arthritis of right knee 03/29/2013   Effusion of knee joint 03/29/2013   Bursitis/tendonitis, shoulder 03/10/2013   Effusion of knee joint, left 08/18/2011   Knee pain 08/18/2011   Acute torn meniscus 07/30/2011   Old torn meniscus of knee 07/30/2011   ARTHRITIS, LEFT KNEE 10/15/2010   MEDIAL MENISCUS TEAR, RIGHT 10/15/2010   HIP PAIN 01/15/2010   DEGENERATIVE DISC DISEASE, LUMBOSACRAL SPINE W/RADICULOPATHY 01/15/2010   PLICA SYNDROME 08/09/2009   DERANGEMENT MENISCUS 07/09/2009   JOINT EFFUSION, LEFT KNEE 07/09/2009   Unilateral primary osteoarthritis, left knee 01/30/2009   KNEE PAIN 01/30/2009   ANKLE SPRAIN, RIGHT 11/08/2007   Past Medical History:  Diagnosis Date   Anxiety    Arthritis    Asthma    BPH (benign prostatic hyperplasia)    Complication of anesthesia    pt had a hard time being able to move after spinal anesthesia , 3-4 hours   Depression    GERD (gastroesophageal reflux disease)    Gout    no meds   Headache(784.0)    otc meds prn   Heart murmur    dx as a child, no problems as an adult   History of hiatal hernia    Hyperlipidemia    IBS (irritable bowel syndrome)    PONV (postoperative nausea and vomiting)    Pre-diabetes    Borderline, diet and exercise, no med   Sleep apnea    uses CIPAP machine at night   Wears partial dentures    bottom partial    Family History  Problem Relation Age of Onset   Diabetes Mother    Alzheimer's disease Father    Diabetes Father    Diabetes Other    Lung disease Other    Arthritis Other    Anesthesia problems Neg Hx    Hypotension Neg Hx    Malignant hyperthermia Neg Hx    Pseudochol deficiency Neg Hx    Sleep apnea Neg Hx     Past Surgical History:  Procedure Laterality Date   ANTERIOR CERVICAL DECOMP/DISCECTOMY FUSION N/A 10/13/2022   Procedure: ANTERIOR CERVICAL DISECTOMY FUSION - CERVICAL THREE-CERVICAL FOUR - CERVICAL FOUR-CERVICAL FIVE REMOVAL OF HARDWARE  CERVICAL FIVE-CERVICAL SIX;  Surgeon: Donalee Citrin, MD;  Location: [facility];  Service: Neurosurgery;  Laterality: N/A;   APPENDECTOMY     BACK SURGERY  2002   neck and back fusion   BIOPSY  12/27/2015   Procedure: BIOPSY;  Surgeon: Malissa Hippo, MD;  Location: [facility];  Service: Endoscopy;;  Fundus biopsies and duodenal biopsies   BIOPSY  02/10/2020   Procedure: BIOPSY;  Surgeon: Malissa Hippo, MD;  [facility]: [facility];  Service: Endoscopy;;  antral   CARDIAC CATHETERIZATION     CARDIAC CATHETERIZATION N/A 09/04/2016   Procedure: Right/Left Heart Cath and Coronary Angiography;  Surgeon: Peter M Swaziland, MD;  Location: [facility] INVASIVE CV LAB;  Service: Cardiovascular;  Laterality: N/A;   CHOLECYSTECTOMY  CHONDROPLASTY  08/15/2011   Procedure: CHONDROPLASTY;  Surgeon: Fuller Canada, MD;  Location: [facility];  Service: Orthopedics;  Laterality: Left;   COLONOSCOPY  06/27/2011   Procedure: COLONOSCOPY;  Surgeon: Malissa Hippo, MD;  [facility]: [facility];  Service: Endoscopy;  Laterality: N/A;  9:00 / Pt to be here at 9am for 10:45 procedure, benign polyps removed   COLONOSCOPY N/A 10/05/2014   Procedure: COLONOSCOPY;  Surgeon: Malissa Hippo, MD;  [facility]: [facility];  Service: Endoscopy;  Laterality: N/A;  930   COLONOSCOPY N/A 02/11/2018   Procedure: COLONOSCOPY;  Surgeon: Malissa Hippo, MD;  [facility];  Service: Endoscopy;  Laterality: N/A;  830   ESOPHAGOGASTRODUODENOSCOPY N/A 12/27/2015   Procedure: ESOPHAGOGASTRODUODENOSCOPY (EGD);  Surgeon: Malissa Hippo, MD;  [facility];  Service: Endoscopy;  Laterality: N/A;  3:00   ESOPHAGOGASTRODUODENOSCOPY (EGD) WITH PROPOFOL N/A 02/10/2020   Procedure: ESOPHAGOGASTRODUODENOSCOPY (EGD) WITH PROPOFOL;  Surgeon: Malissa Hippo, MD;  [facility];  Service: Endoscopy;  Laterality: N/A;  155   HERNIA REPAIR  1998   umbilical hernia   JOINT REPLACEMENT  2023   knee and shoulder   KNEE  ARTHROSCOPY     left knee   KNEE ARTHROSCOPY     right knee    LUMBAR LAMINECTOMY/DECOMPRESSION MICRODISCECTOMY  09/14/2012   Procedure: LUMBAR LAMINECTOMY/DECOMPRESSION MICRODISCECTOMY 1 LEVEL;  Surgeon: Karn Cassis, MD;  Location: [facility];  Service: Neurosurgery;  Laterality: Right;  Right Lumbar three-four Diskectomy   neck fusion  2005   NECK SURGERY     PATELLA-FEMORAL ARTHROPLASTY Right 02/18/2022   Procedure: RIGHT KNEE PATELLA REPLACEMENT, CEMENTED;  Surgeon: Cammy Copa, MD;  Location: [facility];  Service: Orthopedics;  Laterality: Right;   POLYPECTOMY  02/11/2018   Procedure: POLYPECTOMY;  Surgeon: Malissa Hippo, MD;  [facility];  Service: Endoscopy;;  colon   REVISION TOTAL SHOULDER TO REVERSE TOTAL SHOULDER Left 12/16/2018   Procedure: REVISION TOTAL SHOULDER TO REVERSE TOTAL SHOULDER;  Surgeon: Cammy Copa, MD;  Location: [facility];  Service: Orthopedics;  Laterality: Left;   SHOULDER ARTHROSCOPY WITH BICEPSTENOTOMY Left 09/23/2013   Procedure: SHOULDER ARTHROSCOPY WITH BICEPSTENOTOMY AND EXTENSIVE DEBRIDEMENT;  Surgeon: Vickki Hearing, MD;  Location: [facility];  Service: Orthopedics;  Laterality: Left;   SHOULDER ARTHROSCOPY WITH ROTATOR CUFF REPAIR Left 05/05/2014   Procedure: SHOULDER ARTHROSCOPY LIMITED DEBRIDEMENT;  Surgeon: Vickki Hearing, MD;  Location: [facility];  Service: Orthopedics;  Laterality: Left;   SHOULDER OPEN ROTATOR CUFF REPAIR Left 05/05/2014   Procedure: ROTATOR CUFF REPAIR SHOULDER OPEN;  Surgeon: Vickki Hearing, MD;  Location: [facility];  Service: Orthopedics;  Laterality: Left;   SHOULDER OPEN ROTATOR CUFF REPAIR Left 12/16/2018   Procedure: LEFT SHOULDER POSSIBLE SUBSCAPULARIS REPAIR VS. REVISION TO REVERSE TOTAL SHOULDER REPLACEMENT;  Surgeon: Cammy Copa, MD;  Location: [facility];  Service: Orthopedics;  Laterality: Left;   SHOULDER SURGERY Right    Open Mumford procedure   SPINAL FUSION     x2, 2003 and 2007    TOTAL KNEE ARTHROPLASTY Left 06/16/2017   Procedure: LEFT TOTAL KNEE ARTHROPLASTY;  Surgeon: Vickki Hearing, MD;  Location: [facility];  Service: Orthopedics;  Laterality: Left;   TOTAL KNEE ARTHROPLASTY Right 05/14/2021   Procedure: TOTAL KNEE ARTHROPLASTY;  Surgeon: Vickki Hearing, MD;  Location: [facility];  Service: Orthopedics;  Laterality: Right;   TOTAL SHOULDER ARTHROPLASTY Left 08/19/2018   Procedure: left  shoulder replacement;  Surgeon: Cammy Copa, MD;  Location[facility] OR;  Service: Orthopedics;  Laterality: Left;   Social History   Occupational History   Occupation: Event organiser: DISABLED  Tobacco Use   Smoking status: Former    Packs/day: 1.00    Years: 44.00    Additional pack years: 0.00    Total pack years: 44.00    Types: Cigarettes    Quit date: 08/25/2022    Years since quitting: 0.5    Passive exposure: Current   Smokeless tobacco: Never   Tobacco comments:    smokes a pack a day. since age 76  Vaping Use   Vaping Use: Never used  Substance and Sexual Activity   Alcohol use: Never   Drug use: Never   Sexual activity: Yes

## 2023-03-18 ENCOUNTER — Ambulatory Visit (HOSPITAL_COMMUNITY)
Admission: RE | Admit: 2023-03-18 | Discharge: 2023-03-18 | Disposition: A | Discharge status: Home / Self Care | Payer: PPO | Source: Ambulatory Visit | Attending: Pulmonary Disease | Admitting: Pulmonary Disease

## 2023-03-18 DIAGNOSIS — R0609 Other forms of dyspnea: Secondary | ICD-10-CM | POA: Diagnosis present

## 2023-03-18 LAB — ECHOCARDIOGRAM COMPLETE
Area-P 1/2: 3.72 cm2
S' Lateral: 3.1 cm

## 2023-03-18 NOTE — Progress Notes (Signed)
*  PRELIMINARY RESULTS* Echocardiogram 2D Echocardiogram has been performed.  Stacey Drain 03/18/2023, 2:35 PM

## 2023-03-19 ENCOUNTER — Ambulatory Visit
Admission: RE | Admit: 2023-03-19 | Discharge: 2023-03-19 | Disposition: A | Discharge status: Home / Self Care | Payer: PPO | Source: Ambulatory Visit | Attending: Student | Admitting: Student

## 2023-03-19 DIAGNOSIS — M5412 Radiculopathy, cervical region: Secondary | ICD-10-CM

## 2023-03-20 ENCOUNTER — Ambulatory Visit (INDEPENDENT_AMBULATORY_CARE_PROVIDER_SITE_OTHER): Payer: PPO

## 2023-03-20 DIAGNOSIS — L501 Idiopathic urticaria: Secondary | ICD-10-CM

## 2023-03-26 ENCOUNTER — Other Ambulatory Visit: Payer: PPO

## 2023-03-30 ENCOUNTER — Ambulatory Visit: Payer: PPO | Admitting: Internal Medicine

## 2023-04-02 ENCOUNTER — Other Ambulatory Visit (HOSPITAL_COMMUNITY): Payer: Self-pay | Admitting: Student

## 2023-04-02 DIAGNOSIS — M542 Cervicalgia: Secondary | ICD-10-CM

## 2023-04-06 ENCOUNTER — Ambulatory Visit: Payer: PPO | Admitting: Internal Medicine

## 2023-04-06 ENCOUNTER — Other Ambulatory Visit: Payer: Self-pay

## 2023-04-06 ENCOUNTER — Encounter: Payer: Self-pay | Admitting: Internal Medicine

## 2023-04-06 VITALS — BP 150/80 | HR 69 | Temp 98.7°F | Resp 20

## 2023-04-06 DIAGNOSIS — L501 Idiopathic urticaria: Secondary | ICD-10-CM | POA: Diagnosis not present

## 2023-04-06 MED ORDER — MONTELUKAST SODIUM 10 MG PO TABS: 10.0000 mg | ORAL_TABLET | Freq: Every day | ORAL | 5 refills | Status: DC

## 2023-04-06 MED ORDER — FAMOTIDINE 40 MG PO TABS: 40.0000 mg | ORAL_TABLET | Freq: Two times a day (BID) | ORAL | 5 refills | Status: DC

## 2023-04-06 MED ORDER — FEXOFENADINE HCL 180 MG PO TABS: 360.0000 mg | ORAL_TABLET | Freq: Two times a day (BID) | ORAL | 5 refills | Status: DC

## 2023-04-06 MED ORDER — HYDROXYZINE PAMOATE 25 MG PO CAPS: 25.0000 mg | ORAL_CAPSULE | Freq: Every evening | ORAL | 1 refills | Status: DC | PRN

## 2023-04-06 NOTE — Patient Instructions (Signed)
Idiopathic Urticaria/Angioedema (Hives/Swelling): - Continue Xolair 300mg  every 4 weeks.  Keep Epipen. - Continue with suppressive antihistamine dosing:  - Continue Allegra (Fexofenadine) 360 mg twice a day - Continue Famotidine 40 mg twice a day - Continue Hydroxyzine 25 to 50 mg once at bedtime as needed. - Continue Singulair (montelukast) 10mg . - Okay to use Sarna lotion. Can place in refrigerator so it has a nice cooling effect when you apply it.  Also moisturize skin twice daily as dry skin itches using a cream such as Cerave, Cetaphil, Eucerin, Aveeno.

## 2023-04-06 NOTE — Progress Notes (Signed)
   FOLLOW UP Date of Service/Encounter:  04/06/23   Subjective:  Christopher [name] (DOB: 11-07-1955) is a 67 y.o. male who [facility] for follow up for idiopathic urticaria/angioedema.   History obtained from: chart review and patient. Last visit was on 01/19/2023 with me and was well controlled on Xolair.  Still on high dose anti histamines.   Doing okay overall.  Has had about 3 episodes of hives since last visit.  Also noted that he gets itchy palms and feet.  Does note that when he forgets his anti histamines, his hives flare up.  He is taking Allegra 360mg  BID, Pepcid 40mg  BID, Singulair daily and hydroxyzine PRN.  He only uses hydroxyzine at nighttime for severe itching or hives; does not take it in daytime.   He is worried about is neck.  They are planning on doing dry needling and wonders if that will flare up hives hives.   Past Medical History: Past Medical History:  Diagnosis Date   Anxiety    Arthritis    Asthma    BPH (benign prostatic hyperplasia)    Complication of anesthesia    pt had a hard time being able to move after spinal anesthesia , 3-4 hours   Depression    GERD (gastroesophageal reflux disease)    Gout    no meds   Headache(784.0)    otc meds prn   Heart murmur    dx as a child, no problems as an adult   History of hiatal hernia    Hyperlipidemia    IBS (irritable bowel syndrome)    PONV (postoperative nausea and vomiting)    Pre-diabetes    Borderline, diet and exercise, no med   Sleep apnea    uses CIPAP machine at night   Wears partial dentures    bottom partial    Objective:  BP (!) 150/80   Pulse 69   Temp 98.7 F (37.1 C)   Resp 20   SpO2 94%  There is no height or weight on file to calculate BMI. Physical Exam: GEN: alert, well developed HEENT: clear conjunctiva, MMM HEART: regular rate and rhythm, no murmur LUNGS: clear to auscultation bilaterally, no coughing, unlabored  respiration SKIN: no rashes or lesions  Assessment:   1. Idiopathic urticaria     Plan/Recommendations:  Idiopathic Urticaria/Angioedema (Hives/Swelling): - Discussed multiple contributors for hive flare ups and unfortunately any procedure can do it if it results in stress.  - Controlled  - Continue Xolair 300mg  every 4 weeks.  Keep Epipen. - Continue with suppressive antihistamine dosing:  - Continue Allegra (Fexofenadine) 360 mg twice a day - Continue Famotidine 40 mg twice a day - Continue Hydroxyzine 25 to 50 mg once at bedtime as needed. - Continue Singulair (montelukast) 10mg . - Okay to use Sarna lotion. Can place in refrigerator so it has a nice cooling effect when you apply it.  Also moisturize skin twice daily as dry skin itches using a cream such as Cerave, Cetaphil, Eucerin, Aveeno.      Return in about 4 months (around 08/06/2023).  Alesia Morin, MD [facility]

## 2023-04-16 ENCOUNTER — Other Ambulatory Visit (HOSPITAL_COMMUNITY): Payer: PPO

## 2023-04-17 ENCOUNTER — Ambulatory Visit (INDEPENDENT_AMBULATORY_CARE_PROVIDER_SITE_OTHER): Payer: PPO

## 2023-04-17 DIAGNOSIS — L501 Idiopathic urticaria: Secondary | ICD-10-CM | POA: Diagnosis not present

## 2023-05-07 ENCOUNTER — Other Ambulatory Visit: Payer: Self-pay | Admitting: Internal Medicine

## 2023-05-07 DIAGNOSIS — E782 Mixed hyperlipidemia: Secondary | ICD-10-CM

## 2023-05-08 ENCOUNTER — Ambulatory Visit (HOSPITAL_COMMUNITY)
Admission: RE | Admit: 2023-05-08 | Discharge: 2023-05-08 | Disposition: A | Discharge status: Home / Self Care | Payer: PPO | Source: Ambulatory Visit | Attending: Student | Admitting: Student

## 2023-05-08 DIAGNOSIS — M542 Cervicalgia: Secondary | ICD-10-CM | POA: Insufficient documentation

## 2023-05-11 ENCOUNTER — Ambulatory Visit (HOSPITAL_COMMUNITY): Payer: PPO | Attending: Physical Therapy | Admitting: Physical Therapy

## 2023-05-11 DIAGNOSIS — M6281 Muscle weakness (generalized): Secondary | ICD-10-CM | POA: Insufficient documentation

## 2023-05-11 DIAGNOSIS — M542 Cervicalgia: Secondary | ICD-10-CM | POA: Insufficient documentation

## 2023-05-11 DIAGNOSIS — R293 Abnormal posture: Secondary | ICD-10-CM | POA: Insufficient documentation

## 2023-05-18 ENCOUNTER — Ambulatory Visit (INDEPENDENT_AMBULATORY_CARE_PROVIDER_SITE_OTHER): Payer: PPO

## 2023-05-18 DIAGNOSIS — L501 Idiopathic urticaria: Secondary | ICD-10-CM | POA: Diagnosis not present

## 2023-05-20 ENCOUNTER — Ambulatory Visit: Payer: PPO | Admitting: Urology

## 2023-05-20 VITALS — BP 145/77 | HR 67

## 2023-05-20 DIAGNOSIS — E291 Testicular hypofunction: Secondary | ICD-10-CM

## 2023-05-20 DIAGNOSIS — N138 Other obstructive and reflux uropathy: Secondary | ICD-10-CM | POA: Diagnosis not present

## 2023-05-20 DIAGNOSIS — N401 Enlarged prostate with lower urinary tract symptoms: Secondary | ICD-10-CM

## 2023-05-20 DIAGNOSIS — R350 Frequency of micturition: Secondary | ICD-10-CM | POA: Diagnosis not present

## 2023-05-20 NOTE — Progress Notes (Signed)
05/20/2023 3:08 PM   Christopher [name] 09/17/1955 [id number]  Referring provider: Billie Lade, MD [address] [place],  [place] [address]  Followup hypogonadism   HPI: Mr Zeltser is a 67yo here for followup for hypogonadism. No recent labs. He is on jatenzo 158mg  BID. He underwent cervical disc surgery which per patient is not healing appropriately. He has decreased energy on jatenzo. IPSS 4 QOL 1. He drinks 1/2 gallon of tea daily. Nocturia 2-3x.    PMH: Past Medical History:  Diagnosis Date   Anxiety    Arthritis    Asthma    BPH (benign prostatic hyperplasia)    Complication of anesthesia    pt had a hard time being able to move after spinal anesthesia , 3-4 hours   Depression    GERD (gastroesophageal reflux disease)    Gout    no meds   Headache(784.0)    otc meds prn   Heart murmur    dx as a child, no problems as an adult   History of hiatal hernia    Hyperlipidemia    IBS (irritable bowel syndrome)    PONV (postoperative nausea and vomiting)    Pre-diabetes    Borderline, diet and exercise, no med   Sleep apnea    uses CIPAP machine at night   Wears partial dentures    bottom partial    Surgical History: Past Surgical History:  Procedure Laterality Date   ANTERIOR CERVICAL DECOMP/DISCECTOMY FUSION N/A 10/13/2022   Procedure: ANTERIOR CERVICAL DISECTOMY FUSION - CERVICAL THREE-CERVICAL FOUR - CERVICAL FOUR-CERVICAL FIVE REMOVAL OF HARDWARE CERVICAL FIVE-CERVICAL SIX;  Surgeon: Donalee Citrin, MD;  Location: [facility];  Service: Neurosurgery;  Laterality: N/A;   APPENDECTOMY     BACK SURGERY  2002   neck and back fusion   BIOPSY  12/27/2015   Procedure: BIOPSY;  Surgeon: Malissa Hippo, MD;  Location: [facility];  Service: Endoscopy;;  Fundus biopsies and duodenal biopsies   BIOPSY  02/10/2020   Procedure: BIOPSY;  Surgeon: Malissa Hippo, MD;  [facility]: [facility];  Service: Endoscopy;;  antral   CARDIAC CATHETERIZATION     CARDIAC  CATHETERIZATION N/A 09/04/2016   Procedure: Right/Left Heart Cath and Coronary Angiography;  Surgeon: Peter M Swaziland, MD;  Location: [facility] INVASIVE CV LAB;  Service: Cardiovascular;  Laterality: N/A;   CHOLECYSTECTOMY     CHONDROPLASTY  08/15/2011   Procedure: CHONDROPLASTY;  Surgeon: Fuller Canada, MD;  Location: [facility];  Service: Orthopedics;  Laterality: Left;   COLONOSCOPY  06/27/2011   Procedure: COLONOSCOPY;  Surgeon: Malissa Hippo, MD;  [facility]: [facility];  Service: Endoscopy;  Laterality: N/A;  9:00 / Pt to be here at 9am for 10:45 procedure, benign polyps removed   COLONOSCOPY N/A 10/05/2014   Procedure: COLONOSCOPY;  Surgeon: Malissa Hippo, MD;  [facility];  Service: Endoscopy;  Laterality: N/A;  930   COLONOSCOPY N/A 02/11/2018   Procedure: COLONOSCOPY;  Surgeon: Malissa Hippo, MD;  [facility];  Service: Endoscopy;  Laterality: N/A;  830   ESOPHAGOGASTRODUODENOSCOPY N/A 12/27/2015   Procedure: ESOPHAGOGASTRODUODENOSCOPY (EGD);  Surgeon: Malissa Hippo, MD;  [facility];  Service: Endoscopy;  Laterality: N/A;  3:00   ESOPHAGOGASTRODUODENOSCOPY (EGD) WITH PROPOFOL N/A 02/10/2020   Procedure: ESOPHAGOGASTRODUODENOSCOPY (EGD) WITH PROPOFOL;  Surgeon: Malissa Hippo, MD;  [facility];  Service: Endoscopy;  Laterality: N/A;  155   HERNIA REPAIR  1998   umbilical hernia   JOINT REPLACEMENT  2023   knee and shoulder   KNEE ARTHROSCOPY     left knee   KNEE ARTHROSCOPY     right knee    LUMBAR LAMINECTOMY/DECOMPRESSION MICRODISCECTOMY  09/14/2012   Procedure: LUMBAR LAMINECTOMY/DECOMPRESSION MICRODISCECTOMY 1 LEVEL;  Surgeon: Karn Cassis, MD;  Location: [facility];  Service: Neurosurgery;  Laterality: Right;  Right Lumbar three-four Diskectomy   neck fusion  2005   NECK SURGERY     PATELLA-FEMORAL ARTHROPLASTY Right 02/18/2022   Procedure: RIGHT KNEE PATELLA REPLACEMENT, CEMENTED;  Surgeon: Cammy Copa, MD;   Location: [facility];  Service: Orthopedics;  Laterality: Right;   POLYPECTOMY  02/11/2018   Procedure: POLYPECTOMY;  Surgeon: Malissa Hippo, MD;  [facility];  Service: Endoscopy;;  colon   REVISION TOTAL SHOULDER TO REVERSE TOTAL SHOULDER Left 12/16/2018   Procedure: REVISION TOTAL SHOULDER TO REVERSE TOTAL SHOULDER;  Surgeon: Cammy Copa, MD;  Location: [facility];  Service: Orthopedics;  Laterality: Left;   SHOULDER ARTHROSCOPY WITH BICEPSTENOTOMY Left 09/23/2013   Procedure: SHOULDER ARTHROSCOPY WITH BICEPSTENOTOMY AND EXTENSIVE DEBRIDEMENT;  Surgeon: Vickki Hearing, MD;  Location: [facility];  Service: Orthopedics;  Laterality: Left;   SHOULDER ARTHROSCOPY WITH ROTATOR CUFF REPAIR Left 05/05/2014   Procedure: SHOULDER ARTHROSCOPY LIMITED DEBRIDEMENT;  Surgeon: Vickki Hearing, MD;  Location: [facility];  Service: Orthopedics;  Laterality: Left;   SHOULDER OPEN ROTATOR CUFF REPAIR Left 05/05/2014   Procedure: ROTATOR CUFF REPAIR SHOULDER OPEN;  Surgeon: Vickki Hearing, MD;  Location: [facility];  Service: Orthopedics;  Laterality: Left;   SHOULDER OPEN ROTATOR CUFF REPAIR Left 12/16/2018   Procedure: LEFT SHOULDER POSSIBLE SUBSCAPULARIS REPAIR VS. REVISION TO REVERSE TOTAL SHOULDER REPLACEMENT;  Surgeon: Cammy Copa, MD;  Location: [facility];  Service: Orthopedics;  Laterality: Left;   SHOULDER SURGERY Right    Open Mumford procedure   SPINAL FUSION     x2, 2003 and 2007   TOTAL KNEE ARTHROPLASTY Left 06/16/2017   Procedure: LEFT TOTAL KNEE ARTHROPLASTY;  Surgeon: Vickki Hearing, MD;  Location: [facility];  Service: Orthopedics;  Laterality: Left;   TOTAL KNEE ARTHROPLASTY Right 05/14/2021   Procedure: TOTAL KNEE ARTHROPLASTY;  Surgeon: Vickki Hearing, MD;  Location: [facility];  Service: Orthopedics;  Laterality: Right;   TOTAL SHOULDER ARTHROPLASTY Left 08/19/2018   Procedure: left shoulder replacement;  Surgeon: Cammy Copa, MD;  Location: [facility] OR;  Service:  Orthopedics;  Laterality: Left;    Home Medications:  Allergies as of 05/20/2023       Reactions   Celebrex [celecoxib] Itching, Swelling   All over   Codeine Nausea And Vomiting, Other (See Comments)   Extreme stomach pain. This includes anything with the derivative of codeine in it. (Does tolerate hydrocodone)   Cortisone Swelling   SWELLING REACTION UNSPECIFIED    Doxycycline Swelling, Other (See Comments)   Made tongue turn black    Medrol [methylprednisolone] Hives, Swelling   Angioedema   Prednisone Swelling   SWELLING REACTION UNSPECIFIED    Relafen [nabumetone] Swelling   SWELLING REACTION UNSPECIFIED    Aspirin Other (See Comments)   Stomach cramps   Oxycodone-acetaminophen Itching, Other (See Comments)   Can tolerate with benadryl    Raloxifene Rash   Rofecoxib Rash        Medication List        Accurate as of May 20, 2023  3:08 PM. If you have any  questions, ask your nurse or doctor.          STOP taking these medications    Testosterone 20.25 MG/1.25GM (1.62%) Gel Commonly known as: AndroGel       TAKE these medications    albuterol 108 (90 Base) MCG/ACT inhaler Commonly known as: VENTOLIN HFA inhale 2 puffs into lungs every 6 hours as needed for wheezing or shortness of breath   cyclobenzaprine 10 MG tablet Commonly known as: FLEXERIL Take 1 tablet (10 mg total) by mouth 3 (three) times daily as needed for muscle spasms. Do not take with Robaxin   EPINEPHrine 0.3 mg/0.3 mL Soaj injection Commonly known as: EPI-PEN Inject 0.3 mg into the muscle as needed for anaphylaxis.   famotidine 40 MG tablet Commonly known as: Pepcid Take 1 tablet (40 mg total) by mouth 2 (two) times daily.   fexofenadine 180 MG tablet Commonly known as: Allegra Allergy Take 2 tablets (360 mg total) by mouth in the morning and at bedtime.   gabapentin 100 MG capsule Commonly known as: NEURONTIN   HYDROcodone-acetaminophen 5-325 MG tablet Commonly known  as: NORCO/VICODIN Take 1 tablet by mouth every 4 (four) hours as needed for moderate pain.   hydrOXYzine 25 MG capsule Commonly known as: VISTARIL Take 1 capsule (25 mg total) by mouth at bedtime as needed for itching (hives).   Jatenzo 158 MG Caps Generic drug: Testosterone Undecanoate Take 1 capsule (158 mg total) by mouth 2 (two) times daily.   meloxicam 7.5 MG tablet Commonly known as: MOBIC Take 7.5 mg by mouth 2 (two) times daily.   montelukast 10 MG tablet Commonly known as: Singulair Take 1 tablet (10 mg total) by mouth at bedtime.   nitroGLYCERIN 0.4 MG SL tablet Commonly known as: NITROSTAT PLACE (1) TABLET UNDER TONGUE EVERY 5 MINUTES UP TO (3) DOSES. IF NO RELIEF CALL 911.   pantoprazole 40 MG tablet Commonly known as: PROTONIX TAKE (1) TABLET TWICE A DAY BEFORE MEALS. What changed: See the new instructions.   rosuvastatin 20 MG tablet Commonly known as: CRESTOR take 1 tablet by mouth once daily.   sertraline 50 MG tablet Commonly known as: ZOLOFT Take 50 mg by mouth in the morning.   Stiolto Respimat 2.5-2.5 MCG/ACT Aers Generic drug: Tiotropium Bromide-Olodaterol Inhale 2 puffs into the lungs daily.        Allergies:  Allergies  Allergen Reactions   Celebrex [Celecoxib] Itching and Swelling    All over   Codeine Nausea And Vomiting and Other (See Comments)    Extreme stomach pain. This includes anything with the derivative of codeine in it. (Does tolerate hydrocodone)   Cortisone Swelling    SWELLING REACTION UNSPECIFIED    Doxycycline Swelling and Other (See Comments)    Made tongue turn black    Medrol [Methylprednisolone] Hives and Swelling    Angioedema    Prednisone Swelling    SWELLING REACTION UNSPECIFIED    Relafen [Nabumetone] Swelling    SWELLING REACTION UNSPECIFIED    Aspirin Other (See Comments)    Stomach cramps   Oxycodone-Acetaminophen Itching and Other (See Comments)    Can tolerate with benadryl    Raloxifene Rash    Rofecoxib Rash    Family History: Family History  Problem Relation Age of Onset   Diabetes Mother    Alzheimer's disease Father    Diabetes Father    Diabetes Other    Lung disease Other    Arthritis Other    Anesthesia problems Neg Hx  Hypotension Neg Hx    Malignant hyperthermia Neg Hx    Pseudochol deficiency Neg Hx    Sleep apnea Neg Hx     Social History:  reports that he quit smoking about 8 months ago. His smoking use included cigarettes. He started smoking about 44 years ago. He has a 44 pack-year smoking history. He has been exposed to tobacco smoke. He has never used smokeless tobacco. He reports that he does not drink alcohol and does not use drugs.  ROS: All other review of systems were reviewed and are negative except what is noted above in HPI  Physical Exam: BP (!) 145/77   Pulse 67   Constitutional:  Alert and oriented, No acute distress. HEENT: [place] AT, moist mucus membranes.  Trachea midline, no masses. Cardiovascular: No clubbing, cyanosis, or edema. Respiratory: Normal respiratory effort, no increased work of breathing. GI: Abdomen is soft, nontender, nondistended, no abdominal masses GU: No CVA tenderness.  Lymph: No cervical or inguinal lymphadenopathy. Skin: No rashes, bruises or suspicious lesions. Neurologic: Grossly intact, no focal deficits, moving all 4 extremities. Psychiatric: Normal mood and affect.  Laboratory Data: Lab Results  Component Value Date   WBC 8.0 02/11/2023   HGB 14.1 02/11/2023   HCT 42.5 02/11/2023   MCV 81 02/11/2023   PLT 240 02/11/2023    Lab Results  Component Value Date   CREATININE 0.85 02/11/2023    No results found for: "PSA"  Lab Results  Component Value Date   TESTOSTERONE 200 (L) 02/11/2023    Lab Results  Component Value Date   HGBA1C 6.4 (H) 01/05/2023    Urinalysis    Component Value Date/Time   COLORURINE YELLOW 02/11/2022 1008   APPEARANCEUR Clear 02/17/2023 1420   LABSPEC 1.012  02/11/2022 1008   PHURINE 6.0 02/11/2022 1008   GLUCOSEU Negative 02/17/2023 1420   HGBUR NEGATIVE 02/11/2022 1008   BILIRUBINUR Negative 02/17/2023 1420   KETONESUR negative 10/22/2022 1017   KETONESUR NEGATIVE 02/11/2022 1008   PROTEINUR Negative 02/17/2023 1420   PROTEINUR NEGATIVE 02/11/2022 1008   UROBILINOGEN 0.2 10/22/2022 1017   NITRITE Negative 02/17/2023 1420   NITRITE NEGATIVE 02/11/2022 1008   LEUKOCYTESUR Negative 02/17/2023 1420   LEUKOCYTESUR NEGATIVE 02/11/2022 1008    Lab Results  Component Value Date   LABMICR See below: 02/17/2023   WBCUA 0-5 02/17/2023   LABEPIT 0-10 02/17/2023   BACTERIA None seen 02/17/2023    Pertinent Imaging:  Results for orders placed during the hospital encounter of 02/19/10  DG Abd 1 View  Narrative Clinical Data: Vomiting and abdominal pain.  Status post lumbar spine surgery 2 days ago.  ABDOMEN - 1 VIEW  Comparison: None.  Findings: Mild gaseous distention of the colon with scattered air fluid levels, including the rectum.  Surgical clips in the right upper and lower abdomen.  Laminectomy defects, interbody spacer and pedicle screw and rod fixation at the L4-5 level.  Minimal dextroconvex lumbar scoliosis.  IMPRESSION: Mild colonic ileus.  Provider: Roselee Culver  No results found for this or any previous visit.  No results found for this or any previous visit.  No results found for this or any previous visit.  No results found for this or any previous visit.  No valid procedures specified. No results found for this or any previous visit.  No results found for this or any previous visit.   Assessment & Plan:    1. Hypogonadism in male -testosterone labs. We will likely increase dose of jatenzo  pending testosterone labs   2. Benign prostatic hyperplasia with urinary obstruction Patient defers therapy at this time  3. Frequency of micturition -patient instructed to decrease caffeine intake    No  follow-ups on file.  Wilkie Aye, MD  [facility] [place]

## 2023-05-21 ENCOUNTER — Other Ambulatory Visit: Payer: PPO

## 2023-05-21 DIAGNOSIS — E291 Testicular hypofunction: Secondary | ICD-10-CM

## 2023-05-22 ENCOUNTER — Other Ambulatory Visit: Payer: Self-pay

## 2023-05-22 ENCOUNTER — Ambulatory Visit (HOSPITAL_COMMUNITY): Payer: PPO | Admitting: Physical Therapy

## 2023-05-22 DIAGNOSIS — M542 Cervicalgia: Secondary | ICD-10-CM

## 2023-05-22 DIAGNOSIS — M6281 Muscle weakness (generalized): Secondary | ICD-10-CM | POA: Diagnosis present

## 2023-05-22 DIAGNOSIS — R293 Abnormal posture: Secondary | ICD-10-CM | POA: Diagnosis present

## 2023-05-22 NOTE — Therapy (Signed)
OUTPATIENT PHYSICAL THERAPY CERVICAL EVALUATION   Patient Name: Christopher [name] MRN: [id number] DOB:10-Sep-1955, 67 y.o., male Today's Date: 05/22/2023  END OF SESSION:  PT End of Session - 05/22/23 1428     Visit Number 1    Number of Visits 16    Date for PT Re-Evaluation 07/17/23    Authorization Type [employer]    Progress Note Due on Visit 10    PT Start Time 1430    PT Stop Time 1515    PT Time Calculation (min) 45 min             Past Medical History:  Diagnosis Date   Anxiety    Arthritis    Asthma    BPH (benign prostatic hyperplasia)    Complication of anesthesia    pt had a hard time being able to move after spinal anesthesia , 3-4 hours   Depression    GERD (gastroesophageal reflux disease)    Gout    no meds   Headache(784.0)    otc meds prn   Heart murmur    dx as a child, no problems as an adult   History of hiatal hernia    Hyperlipidemia    IBS (irritable bowel syndrome)    PONV (postoperative nausea and vomiting)    Pre-diabetes    Borderline, diet and exercise, no med   Sleep apnea    uses CIPAP machine at night   Wears partial dentures    bottom partial   Past Surgical History:  Procedure Laterality Date   ANTERIOR CERVICAL DECOMP/DISCECTOMY FUSION N/A 10/13/2022   Procedure: ANTERIOR CERVICAL DISECTOMY FUSION - CERVICAL THREE-CERVICAL FOUR - CERVICAL FOUR-CERVICAL FIVE REMOVAL OF HARDWARE CERVICAL FIVE-CERVICAL SIX;  Surgeon: Donalee Citrin, MD;  Location: [facility];  Service: Neurosurgery;  Laterality: N/A;   APPENDECTOMY     BACK SURGERY  2002   neck and back fusion   BIOPSY  12/27/2015   Procedure: BIOPSY;  Surgeon: Malissa Hippo, MD;  Location: [facility];  Service: Endoscopy;;  Fundus biopsies and duodenal biopsies   BIOPSY  02/10/2020   Procedure: BIOPSY;  Surgeon: Malissa Hippo, MD;  [facility]: [facility];  Service: Endoscopy;;  antral   CARDIAC CATHETERIZATION     CARDIAC CATHETERIZATION N/A 09/04/2016    Procedure: Right/Left Heart Cath and Coronary Angiography;  Surgeon: Peter M Swaziland, MD;  [facility]: [facility] INVAS[facility] CV LAB;  Service: Cardiovascular;  Laterality: N/A;   CHOLECYSTECTOMY     CHONDROPLASTY  08/15/2011   Procedure: CHONDROPLASTY;  Surgeon: Fuller Canada, MD;  Location: [facility] ORS;  Service: Orthopedics;  Laterality: Left;   COLONOSCOPY  06/27/2011   Procedure: COLONOSCOPY;  Surgeon: Malissa Hippo, MD;  [facility]: [facility];  Service: Endoscopy;  Laterality: N/A;  9:00 / Pt to be here at 9am for 10:45 procedure, benign polyps removed   COLONOSCOPY N/A 10/05/2014   Procedure: COLONOSCOPY;  Surgeon: Malissa Hippo, MD;  Location: [facility];  Service: Endoscopy;  Laterality: N/A;  930   COLONOSCOPY N/A 02/11/2018   Procedure: COLONOSCOPY;  Surgeon: Malissa Hippo, MD;  [facility];  Service: Endoscopy;  Laterality: N/A;  830   ESOPHAGOGASTRODUODENOSCOPY N/A 12/27/2015   Procedure: ESOPHAGOGASTRODUODENOSCOPY (EGD);  Surgeon: Malissa Hippo, MD;  [facility];  Service: Endoscopy;  Laterality: N/A;  3:00   ESOPHAGOGASTRODUODENOSCOPY (EGD) WITH PROPOFOL N/A 02/10/2020   Procedure: ESOPHAGOGASTRODUODENOSCOPY (EGD) WITH PROPOFOL;  Surgeon: Malissa Hippo, MD;  [facility]  [facility];  Service: Endoscopy;  Laterality: N/A;  155   HERNIA REPAIR  1998   umbilical hernia   JOINT REPLACEMENT  2023   knee and shoulder   KNEE ARTHROSCOPY     left knee   KNEE ARTHROSCOPY     right knee    LUMBAR LAMINECTOMY/DECOMPRESSION MICRODISCECTOMY  09/14/2012   Procedure: LUMBAR LAMINECTOMY/DECOMPRESSION MICRODISCECTOMY 1 LEVEL;  Surgeon: Karn Cassis, MD;  Location: [facility];  Service: Neurosurgery;  Laterality: Right;  Right Lumbar three-four Diskectomy   neck fusion  2005   NECK SURGERY     PATELLA-FEMORAL ARTHROPLASTY Right 02/18/2022   Procedure: RIGHT KNEE PATELLA REPLACEMENT, CEMENTED;  Surgeon: Cammy Copa, MD;  Location: [facility];  Service:  Orthopedics;  Laterality: Right;   POLYPECTOMY  02/11/2018   Procedure: POLYPECTOMY;  Surgeon: Malissa Hippo, MD;  [facility];  Service: Endoscopy;;  colon   REVISION TOTAL SHOULDER TO REVERSE TOTAL SHOULDER Left 12/16/2018   Procedure: REVISION TOTAL SHOULDER TO REVERSE TOTAL SHOULDER;  Surgeon: Cammy Copa, MD;  Location: [facility] OR;  Service: Orthopedics;  Laterality: Left;   SHOULDER ARTHROSCOPY WITH BICEPSTENOTOMY Left 09/23/2013   Procedure: SHOULDER ARTHROSCOPY WITH BICEPSTENOTOMY AND EXTENSIVE DEBRIDEMENT;  Surgeon: Vickki Hearing, MD;  Location: [facility];  Service: Orthopedics;  Laterality: Left;   SHOULDER ARTHROSCOPY WITH ROTATOR CUFF REPAIR Left 05/05/2014   Procedure: SHOULDER ARTHROSCOPY LIMITED DEBRIDEMENT;  Surgeon: Vickki Hearing, MD;  Location: [facility];  Service: Orthopedics;  Laterality: Left;   SHOULDER OPEN ROTATOR CUFF REPAIR Left 05/05/2014   Procedure: ROTATOR CUFF REPAIR SHOULDER OPEN;  Surgeon: Vickki Hearing, MD;  Location: [facility];  Service: Orthopedics;  Laterality: Left;   SHOULDER OPEN ROTATOR CUFF REPAIR Left 12/16/2018   Procedure: LEFT SHOULDER POSSIBLE SUBSCAPULARIS REPAIR VS. REVISION TO REVERSE TOTAL SHOULDER REPLACEMENT;  Surgeon: Cammy Copa, MD;  Location: [facility];  Service: Orthopedics;  Laterality: Left;   SHOULDER SURGERY Right    Open Mumford procedure   SPINAL FUSION     x2, 2003 and 2007   TOTAL KNEE ARTHROPLASTY Left 06/16/2017   Procedure: LEFT TOTAL KNEE ARTHROPLASTY;  Surgeon: Vickki Hearing, MD;  Location: [facility];  Service: Orthopedics;  Laterality: Left;   TOTAL KNEE ARTHROPLASTY Right 05/14/2021   Procedure: TOTAL KNEE ARTHROPLASTY;  Surgeon: Vickki Hearing, MD;  Location: [facility];  Service: Orthopedics;  Laterality: Right;   TOTAL SHOULDER ARTHROPLASTY Left 08/19/2018   Procedure: left shoulder replacement;  Surgeon: Cammy Copa, MD;  Location[facility] OR;  Service: Orthopedics;  Laterality: Left;    Patient Active Problem List   Diagnosis Date Noted   Prediabetes 01/05/2023   History of anaphylaxis 11/13/2022   Idiopathic urticaria 11/03/2022   Angioedema 10/24/2022   Hives 10/24/2022   Spinal stenosis in cervical region 10/13/2022   DOE (dyspnea on exertion) 09/26/2022   Abnormal EKG 09/18/2022   Hypogonadism in male 07/07/2022   Preop cardiovascular exam 07/07/2022   Depression, recurrent (HCC) 06/06/2022   Chronic musculoskeletal pain 06/06/2022   Fatigue 06/06/2022   Current tobacco use 06/06/2022   Encounter to establish care 06/06/2022   S/P right knee surgery 02/18/2022   S/P TKR (total knee replacement), right 05/14/21 07/02/2021   Status post total right knee replacement 05/14/2021   Primary osteoarthritis of right knee    Renal cyst 10/03/2020   Frequency of micturition 10/03/2020   Benign prostatic hyperplasia with urinary obstruction 10/03/2020   IBS (irritable  bowel syndrome) 07/05/2020   Lumbar disc herniation 06/29/2019   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HCC)    Primary osteoarthritis, left shoulder    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   History of colonic polyps 11/26/2017   S/P total knee replacement, left 06/16/17 06/16/2017   Primary osteoarthritis of left knee    HLD (hyperlipidemia) 09/04/2016   Angina pectoris (HCC) 09/04/2016   OSA (obstructive sleep apnea) 09/04/2016   COPD (chronic obstructive pulmonary disease) (HCC) 09/04/2016   Rotator cuff tear 05/05/2014   S/P shoulder surgery 09/26/2013   Arthritis, shoulder region 09/26/2013   Bursitis, shoulder 09/26/2013   Synovitis of shoulder 09/26/2013   Labral tear of shoulder, degenerative 09/26/2013   Biceps tendon tear 09/26/2013   Rotator cuff syndrome of left shoulder 06/21/2013   Arthritis 06/21/2013   Patellofemoral arthritis of right knee 03/29/2013   Effusion of knee joint 03/29/2013   Bursitis/tendonitis, shoulder 03/10/2013   Effusion of knee joint, left 08/18/2011   Knee  pain 08/18/2011   Acute torn meniscus 07/30/2011   Old torn meniscus of knee 07/30/2011   ARTHRITIS, LEFT KNEE 10/15/2010   MEDIAL MENISCUS TEAR, RIGHT 10/15/2010   HIP PAIN 01/15/2010   DEGENERATIVE DISC DISEASE, LUMBOSACRAL SPINE W/RADICULOPATHY 01/15/2010   PLICA SYNDROME 08/09/2009   DERANGEMENT MENISCUS 07/09/2009   JOINT EFFUSION, LEFT KNEE 07/09/2009   Unilateral primary osteoarthritis, left knee 01/30/2009   KNEE PAIN 01/30/2009   ANKLE SPRAIN, RIGHT 11/08/2007    PCP: Billie Lade, MD  REFERRING PROVIDER: Sherryl Manges, NP  REFERRING DIAG: M54.2 (ICD-10-CM) - Cervicalgia  THERAPY DIAG:  Abnormal posture  Cervicalgia  Muscle weakness (generalized)  Rationale for Evaluation and Treatment: Rehabilitation  ONSET DATE: 10/13/22  SUBJECTIVE:                                                                                                                                                                                                         SUBJECTIVE STATEMENT: Pt states he had a fusion in his neck in February and didn't think it's healing. Pt reports his headaches are crazy. It gives him a fit every day. Pt is to get a bone stimulator for his neck. Gets a lot of pulling in his neck. States his head is crooked. Pt states he uses a riding lawnmower. Has not been doing any stretches. Pt states N/T into his shoulders Hand dominance: Right  PERTINENT HISTORY:   anterior cervical discectomy fusion-C3-C4, C4-C5 and removal of hardware at C5-C6 on 10/13/22   PAIN:  Are you having pain? Yes: NPRS scale:  9 currently/10 Pain location: R>L neck Pain description: sharp Aggravating factors: Keeping head straight Relieving factors: Hydrocodone, laying down   PRECAUTIONS: None  RED FLAGS: None     WEIGHT BEARING RESTRICTIONS: No  FALLS:  Has patient fallen in last 6 months? No  LIVING ENVIRONMENT: Lives with: lives with their spouse Lives in:  House/apartment Stairs: No Has following equipment at home: None  OCCUPATION: Retired. Does yard work (uses Electrical engineer), house chores  PLOF: Independent  PATIENT GOALS: Decrease muscle pain/headaches, improve ROM, cervical fusion to heal  NEXT MD VISIT: n/a  OBJECTIVE:   DIAGNOSTIC FINDINGS:  MRI performed 05/08/23 but results are pending  PATIENT SURVEYS:  FOTO 41; predicted 49  COGNITION: Overall cognitive status: Within functional limits for tasks assessed  SENSATION: Reports N/T in bilat shoulders  POSTURE: rounded shoulders, forward head, and increased thoracic kyphosis  PALPATION: Increased muscle tension bilat suboccipitals, L>R mid scalenes, bilat cervical & thoracic paraspinals and bilat UTs,   Hypomobile thoracic spine  CERVICAL ROM:   Active ROM A/PROM (deg) eval  Flexion 25  Extension 15  Right lateral flexion 26  Left lateral flexion 26  Right rotation 38  Left rotation 55   (Blank rows = not tested)  UPPER EXTREMITY ROM:  Active ROM Right eval Left eval  Shoulder flexion    Shoulder extension    Shoulder abduction    Shoulder adduction    Shoulder extension    Shoulder internal rotation    Shoulder external rotation    Elbow flexion    Elbow extension    Wrist flexion    Wrist extension    Wrist ulnar deviation    Wrist radial deviation    Wrist pronation    Wrist supination     (Blank rows = not tested)  UPPER EXTREMITY MMT:  MMT Right eval Left eval  Shoulder flexion    Shoulder extension    Shoulder abduction    Shoulder adduction    Shoulder extension    Shoulder internal rotation    Shoulder external rotation    Middle trapezius    Lower trapezius    Elbow flexion    Elbow extension    Wrist flexion    Wrist extension    Wrist ulnar deviation    Wrist radial deviation    Wrist pronation    Wrist supination    Grip strength     (Blank rows = not tested)  CERVICAL SPECIAL TESTS:  Did not assess  FUNCTIONAL  TESTS:  Did not assess  TODAY'S TREATMENT:                                                                                                                              DATE: 05/22/23 See HEP below  Skilled assessment and palpation for TPDN Trigger Point Dry-Needling  Treatment instructions: Expect mild to moderate muscle soreness. S/S of pneumothorax if dry needled over a lung field, and to seek  immediate medical attention should they occur. Patient verbalized understanding of these instructions and education.  Patient Consent Given: Yes Education handout provided: Yes Muscles treated: bilat UT, suboccipital, L mid scalene Electrical stimulation performed: No Parameters: N/A Treatment response/outcome: Twitch response, decreased muscle tension   PATIENT EDUCATION:  Education details: Exam findings, POC, TPDN, initial HEP Person educated: Patient and Spouse Education method: Explanation, Demonstration, and Handouts Education comprehension: verbalized understanding, returned demonstration, and needs further education  HOME EXERCISE PROGRAM: Access Code: [email or web address] URL: [email or web address] Date: 05/22/2023 Prepared by: Vernon Prey April Kirstie Peri  Exercises - Hooklying Suboccipital Release with Fingers  - 1 x daily - 7 x weekly - 2 sets - 30 sec hold - Seated Cervical Flexion Stretch with Finger Support Behind Neck  - 1 x daily - 7 x weekly - 2 sets - 30 sec hold - Seated Levator Scapulae Stretch  - 1 x daily - 7 x weekly - 2 sets - 30 sec hold - Seated Upper Trapezius Stretch  - 1 x daily - 7 x weekly - 2 sets - 30 sec hold - Standing Upper Trapezius Mobilization with Small Ball  - 1 x daily - 7 x weekly - 2 sets - 30 sec hold  Patient Education - Trigger Point Dry Needling  ASSESSMENT:  CLINICAL IMPRESSION: Patient is a 67 y.o. M who was seen today for physical therapy evaluation and treatment for cervicalgia. PMH significant for ACDF for C3-4, C4-5 in Feb 2024  but has had lumbar fusion, multiple shoulder and knee surgeries. Assessment significant for decreased cervical ROM, muscle pain/spasm, hypomobile thoracic spine and decreased postural stability affecting tolerance to upright postures for home and community tasks. Pt will benefit from PT to address these issues. Interested in dry needling to assist with muscle tension, pain, and encourage more healing. Performed trial today and initiated gentle stretches and discussed massage to keep muscles from spasming.   OBJECTIVE IMPAIRMENTS: decreased activity tolerance, decreased balance, decreased endurance, decreased mobility, decreased ROM, decreased strength, hypomobility, increased fascial restrictions, increased muscle spasms, impaired flexibility, impaired sensation, impaired UE functional use, improper body mechanics, postural dysfunction, and pain.   ACTIVITY LIMITATIONS: carrying, lifting, bending, sitting, standing, sleeping, toileting, dressing, reach over head, and hygiene/grooming  PARTICIPATION LIMITATIONS: meal prep, cleaning, laundry, driving, community activity, and yard work  PERSONAL FACTORS: Age, Fitness, Past/current experiences, and Time since onset of injury/illness/exacerbation are also affecting patient's functional outcome.   REHAB POTENTIAL: Good  CLINICAL DECISION MAKING: Evolving/moderate complexity  EVALUATION COMPLEXITY: Moderate   GOALS: Goals reviewed with patient? Yes  SHORT TERM GOALS: Target date: 06/19/2023   Pt will be ind with initial HEP Baseline:  Goal status: INITIAL  2.  Pt will have improved cervical ROM in all directions by at least 10 deg for driving tasks Baseline:  Goal status: INITIAL  3.  Pt will report >/=25% improvement in headaches/muscle tension Baseline:  Goal status: INITIAL   LONG TERM GOALS: Target date: 07/17/2023   Pt will be ind with progression and management of HEP Baseline:  Goal status: INITIAL  2.  Pt will demo at  least 4/5 mid/low trap strength for improved postural stability  Baseline:  Goal status: INITIAL  3.  Pt will be able to maintain neutral neck position x15 min for home chores Baseline:  Goal status: INITIAL  4.  Pt will report >/=50% improvement in headaches/muscle tension Baseline:  Goal status: INITIAL  5.  Pt will have FOTO score >/=49 Baseline:  Goal status: INITIAL  PLAN:  PT FREQUENCY: 1-2x/week  PT DURATION: 8 weeks  PLANNED INTERVENTIONS: Therapeutic exercises, Therapeutic activity, Neuromuscular re-education, Balance training, Gait training, Patient/Family education, Self Care, Joint mobilization, Dry Needling, Electrical stimulation, Spinal mobilization, Cryotherapy, Moist heat, Taping, Manual therapy, and Re-evaluation  PLAN FOR NEXT SESSION: Assess response to TPDN. Review cervical stretching, work ROM. Initiate postural stabilizing exercises.    Kinsleigh Ludolph April Ma L [place], PT 05/22/2023, 4:22 PM

## 2023-05-25 LAB — TESTOSTERONE,FREE AND TOTAL
Testosterone, Free: 6 pg/mL — ABNORMAL LOW (ref 6.6–18.1)
Testosterone: 106 ng/dL — ABNORMAL LOW (ref 264–916)

## 2023-05-25 LAB — COMPREHENSIVE METABOLIC PANEL
ALT: 21 IU/L (ref 0–44)
AST: 11 IU/L (ref 0–40)
Albumin: 4.4 g/dL (ref 3.9–4.9)
Alkaline Phosphatase: 110 IU/L (ref 44–121)
BUN/Creatinine Ratio: 24 (ref 10–24)
BUN: 21 mg/dL (ref 8–27)
Bilirubin Total: 0.4 mg/dL (ref 0.0–1.2)
CO2: 19 mmol/L — ABNORMAL LOW (ref 20–29)
Calcium: 9.6 mg/dL (ref 8.6–10.2)
Chloride: 95 mmol/L — ABNORMAL LOW (ref 96–106)
Creatinine, Ser: 0.88 mg/dL (ref 0.76–1.27)
Globulin, Total: 2.1 g/dL (ref 1.5–4.5)
Glucose: 362 mg/dL — ABNORMAL HIGH (ref 70–99)
Potassium: 4.7 mmol/L (ref 3.5–5.2)
Sodium: 136 mmol/L (ref 134–144)
Total Protein: 6.5 g/dL (ref 6.0–8.5)
eGFR: 95 mL/min/{1.73_m2} (ref >59)

## 2023-05-25 LAB — CBC
Hematocrit: 44.4 % (ref 37.5–51.0)
Hemoglobin: 14.7 g/dL (ref 13.0–17.7)
MCH: 27.8 pg (ref 26.6–33.0)
MCHC: 33.1 g/dL (ref 31.5–35.7)
MCV: 84 fL (ref 79–97)
Platelets: 211 10*3/uL (ref 150–450)
RBC: 5.28 x10E6/uL (ref 4.14–5.80)
RDW: 15.2 % (ref 11.6–15.4)
WBC: 7.8 10*3/uL (ref 3.4–10.8)

## 2023-05-26 ENCOUNTER — Encounter: Payer: Self-pay | Admitting: Urology

## 2023-05-26 MED ORDER — JATENZO 237 MG PO CAPS: 1.0000 | ORAL_CAPSULE | Freq: Two times a day (BID) | ORAL | 5 refills | Status: DC

## 2023-05-26 NOTE — Patient Instructions (Signed)

## 2023-05-29 ENCOUNTER — Other Ambulatory Visit: Payer: Self-pay | Admitting: Urology

## 2023-05-29 NOTE — Telephone Encounter (Signed)
Patient called he only has 2 pills left - [facility] will not fill the Testosterone Undecanoate (JATENZO) 237 MG CAPS , needs it sent to [facility] pharmacy

## 2023-06-01 ENCOUNTER — Encounter: Payer: Self-pay | Admitting: Pulmonary Disease

## 2023-06-01 ENCOUNTER — Ambulatory Visit: Payer: PPO | Admitting: Pulmonary Disease

## 2023-06-01 DIAGNOSIS — Z23 Encounter for immunization: Secondary | ICD-10-CM

## 2023-06-01 DIAGNOSIS — R0609 Other forms of dyspnea: Secondary | ICD-10-CM

## 2023-06-01 NOTE — Progress Notes (Signed)
[facility]  Chief Complaint  Patient presents with   Follow-up    3 month follow up     Past Surgical History:  He  has a past surgical history that includes Knee arthroscopy; Knee arthroscopy; Spinal fusion; neck fusion (2005); Colonoscopy (06/27/2011); Hernia repair (1998); Cardiac catheterization; Chondroplasty (08/15/2011); Lumbar laminectomy/decompression microdiscectomy (09/14/2012); Shoulder surgery (Right); Appendectomy; Back surgery (2002); Shoulder arthroscopy with rotator cuff repair (Left, 05/05/2014); Shoulder open rotator cuff repair (Left, 05/05/2014); Shoulder arthroscopy with bicepstenotomy (Left, 09/23/2013); Colonoscopy (N/A, 10/05/2014); Esophagogastroduodenoscopy (N/A, 12/27/2015); biopsy (12/27/2015); Cardiac catheterization (N/A, 09/04/2016); Total knee arthroplasty (Left, 06/16/2017); Colonoscopy (N/A, 02/11/2018); polypectomy (02/11/2018); Cholecystectomy; Total shoulder arthroplasty (Left, 08/19/2018); Shoulder open rotator cuff repair (Left, 12/16/2018); Revision total shoulder to reverse total shoulder (Left, 12/16/2018); Joint replacement (2023); Esophagogastroduodenoscopy (egd) with propofol (N/A, 02/10/2020); biopsy (02/10/2020); Total knee arthroplasty (Right, 05/14/2021); Patella-femoral arthroplasty (Right, 02/18/2022); Anterior cervical decomp/discectomy fusion (N/A, 10/13/2022); and Neck surgery.  Past Medical History:  Anxiety, OA, BPH, Depression, GERD, Gout, HLD, IBS, Pre DM, Low T  Constitutional:  BP (!) 152/79   Pulse 87   Ht 5\' 6"  (1.676 m)   Wt 229 lb 12.8 oz (104.2 kg)   SpO2 93%   BMI 37.09 kg/m   Brief Summary:  Christopher [name] is a 67 y.o. male former smoker with dyspnea.      Subjective:   He is here with his wife.  Breathing better since he started stiolto.  Still gets winded in the afternoon.  Hasn't been using albuterol.  Has a cough in the morning but okay through the rest of the  day.  Physical Exam:   Appearance - well kempt   ENMT - no sinus tenderness, no oral exudate, no LAN, Mallampati 3 airway, no stridor  Respiratory - equal breath sounds bilaterally, no wheezing or rales  CV - s1s2 regular rate and rhythm, no murmurs  Ext - no clubbing, no edema  Skin - no rashes  Psych - normal mood and affect    Pulmonary testing:  PFT 01/13/23 >> FEV1 2.00 (67%), FEV1% 65, TLC 5.94 (95%), RV 2.93 (139%), DLCO 72%  Chest Imaging:  LDCT chest 07/24/22 >> coronary calcification, centrilobular and paraseptal emphysema, tiny b/l nodules stable, fatty liver  Sleep Tests:  PSG 07/27/21 >> AHI 48.7, SpO2 low 89%  Cardiac Tests:  Echo 03/18/23 >> EF 60 to 65%, grade 1 DD  Social History:  He  reports that he quit smoking about 9 months ago. His smoking use included cigarettes. He started smoking about 44 years ago. He has a 44 pack-year smoking history. He has been exposed to tobacco smoke. He has never used smokeless tobacco. He reports that he does not drink alcohol and does not use drugs.  Family History:  His family history includes Alzheimer's disease in his father; Arthritis in an other family member; Diabetes in his father, mother, and another family member; Lung disease in an other family member.     Assessment/Plan:   Dyspnea on exertion. - from COPD and obesity with deconditioning - improved with augmented inhaler regimen  COPD with emphysema. - continue stiolto  - prn albuterol - high dose flu shot today  Obstructive sleep apnea. - he will follow up with Dr. Frances Furbish with [facility]  Idiopathic urticaria. - followed by Dr. Dellis Anes with asthma/allergy - on xolair  Time Spent Involved in Patient Care on Day of Examination:  25 minutes  Follow up:   Patient  Instructions  Flu shot today  Follow up in 6 months  Medication List:   Allergies as of 06/01/2023       Reactions   Celebrex [celecoxib] Itching, Swelling   All over    Codeine Nausea And Vomiting, Other (See Comments)   Extreme stomach pain. This includes anything with the derivative of codeine in it. (Does tolerate hydrocodone)   Cortisone Swelling   SWELLING REACTION UNSPECIFIED    Doxycycline Swelling, Other (See Comments)   Made tongue turn black    Medrol [methylprednisolone] Hives, Swelling   Angioedema   Prednisone Swelling   SWELLING REACTION UNSPECIFIED    Relafen [nabumetone] Swelling   SWELLING REACTION UNSPECIFIED    Aspirin Other (See Comments)   Stomach cramps   Oxycodone-acetaminophen Itching, Other (See Comments)   Can tolerate with benadryl    Raloxifene Rash   Rofecoxib Rash        Medication List        Accurate as of June 01, 2023  5:12 PM. If you have any questions, ask your nurse or doctor.          albuterol 108 (90 Base) MCG/ACT inhaler Commonly known as: VENTOLIN HFA inhale 2 puffs into lungs every 6 hours as needed for wheezing or shortness of breath   cyclobenzaprine 10 MG tablet Commonly known as: FLEXERIL Take 1 tablet (10 mg total) by mouth 3 (three) times daily as needed for muscle spasms. Do not take with Robaxin   EPINEPHrine 0.3 mg/0.3 mL Soaj injection Commonly known as: EPI-PEN Inject 0.3 mg into the muscle as needed for anaphylaxis.   famotidine 40 MG tablet Commonly known as: Pepcid Take 1 tablet (40 mg total) by mouth 2 (two) times daily.   fexofenadine 180 MG tablet Commonly known as: Allegra Allergy Take 2 tablets (360 mg total) by mouth in the morning and at bedtime.   gabapentin 100 MG capsule Commonly known as: NEURONTIN   HYDROcodone-acetaminophen 5-325 MG tablet Commonly known as: NORCO/VICODIN Take 1 tablet by mouth every 4 (four) hours as needed for moderate pain.   hydrOXYzine 25 MG capsule Commonly known as: VISTARIL Take 1 capsule (25 mg total) by mouth at bedtime as needed for itching (hives).   Jatenzo 237 MG Caps Generic drug: Testosterone Undecanoate Take  1 capsule (237 mg total) by mouth in the morning and at bedtime.   meloxicam 7.5 MG tablet Commonly known as: MOBIC Take 7.5 mg by mouth 2 (two) times daily.   montelukast 10 MG tablet Commonly known as: Singulair Take 1 tablet (10 mg total) by mouth at bedtime.   nitroGLYCERIN 0.4 MG SL tablet Commonly known as: NITROSTAT PLACE (1) TABLET UNDER TONGUE EVERY 5 MINUTES UP TO (3) DOSES. IF NO RELIEF CALL 911.   pantoprazole 40 MG tablet Commonly known as: PROTONIX TAKE (1) TABLET TWICE A DAY BEFORE MEALS. What changed: See the new instructions.   rosuvastatin 20 MG tablet Commonly known as: CRESTOR take 1 tablet by mouth once daily.   sertraline 50 MG tablet Commonly known as: ZOLOFT Take 50 mg by mouth in the morning.   Stiolto Respimat 2.5-2.5 MCG/ACT Aers Generic drug: Tiotropium Bromide-Olodaterol Inhale 2 puffs into the lungs daily.        Signature:  Coralyn Helling, MD [facility] Pulmonary/Critical Care Pager - [phone] 06/01/2023, 5:12 PM

## 2023-06-01 NOTE — Telephone Encounter (Signed)
Pt stopped by to have medication sent to [facility] as soon as possible.  Out of medication.

## 2023-06-01 NOTE — Patient Instructions (Signed)
Flu shot today Follow up in 6 months 

## 2023-06-03 ENCOUNTER — Other Ambulatory Visit: Payer: Self-pay | Admitting: Internal Medicine

## 2023-06-03 DIAGNOSIS — E782 Mixed hyperlipidemia: Secondary | ICD-10-CM

## 2023-06-03 DIAGNOSIS — J449 Chronic obstructive pulmonary disease, unspecified: Secondary | ICD-10-CM

## 2023-06-04 ENCOUNTER — Other Ambulatory Visit: Payer: Self-pay | Admitting: Internal Medicine

## 2023-06-04 NOTE — Telephone Encounter (Signed)
Please refill if appropriate

## 2023-06-04 NOTE — Telephone Encounter (Signed)
Patient left a message 06-04-2023.   Calling for the second or third time to request refill of:  Testosterone Undecanoate (JATENZO) 237 MG CAPS   Fill at:  [facility]

## 2023-06-05 ENCOUNTER — Encounter (HOSPITAL_COMMUNITY): Payer: Self-pay | Admitting: Physical Therapy

## 2023-06-05 ENCOUNTER — Ambulatory Visit (HOSPITAL_COMMUNITY): Payer: PPO | Attending: Internal Medicine | Admitting: Physical Therapy

## 2023-06-05 DIAGNOSIS — M542 Cervicalgia: Secondary | ICD-10-CM | POA: Diagnosis present

## 2023-06-05 DIAGNOSIS — M6281 Muscle weakness (generalized): Secondary | ICD-10-CM | POA: Insufficient documentation

## 2023-06-05 DIAGNOSIS — R293 Abnormal posture: Secondary | ICD-10-CM | POA: Diagnosis present

## 2023-06-05 NOTE — Therapy (Signed)
OUTPATIENT PHYSICAL THERAPY CERVICAL EVALUATION   Patient Name: Christopher [name] MRN: [id number] DOB:[birth year], 67 y.o., male Today's Date: 06/05/2023  END OF SESSION:  PT End of Session - 06/05/23 1352     Visit Number 2    Number of Visits 16    Date for PT Re-Evaluation 07/17/23    Authorization Type [employer]    Progress Note Due on Visit 10    PT Start Time 1351    PT Stop Time 1430    PT Time Calculation (min) 39 min              Past Medical History:  Diagnosis Date   Anxiety    Arthritis    Asthma    BPH (benign prostatic hyperplasia)    Complication of anesthesia    pt had a hard time being able to move after spinal anesthesia , 3-4 hours   Depression    GERD (gastroesophageal reflux disease)    Gout    no meds   Headache(784.0)    otc meds prn   Heart murmur    dx as a child, no problems as an adult   History of hiatal hernia    Hyperlipidemia    IBS (irritable bowel syndrome)    PONV (postoperative nausea and vomiting)    Pre-diabetes    Borderline, diet and exercise, no med   Sleep apnea    uses CIPAP machine at night   Wears partial dentures    bottom partial   Past Surgical History:  Procedure Laterality Date   ANTERIOR CERVICAL DECOMP/DISCECTOMY FUSION N/A 10/13/2022   Procedure: ANTERIOR CERVICAL DISECTOMY FUSION - CERVICAL THREE-CERVICAL FOUR - CERVICAL FOUR-CERVICAL FIVE REMOVAL OF HARDWARE CERVICAL FIVE-CERVICAL SIX;  Surgeon: Donalee Citrin, MD;  Location: [facility];  Service: Neurosurgery;  Laterality: N/A;   APPENDECTOMY     BACK SURGERY  2002   neck and back fusion   BIOPSY  12/27/2015   Procedure: BIOPSY;  Surgeon: Malissa Hippo, MD;  Location: [facility];  Service: Endoscopy;;  Fundus biopsies and duodenal biopsies   BIOPSY  02/10/2020   Procedure: BIOPSY;  Surgeon: Malissa Hippo, MD;  [facility]: [facility];  Service: Endoscopy;;  antral   CARDIAC CATHETERIZATION     CARDIAC CATHETERIZATION N/A 09/04/2016    Procedure: Right/Left Heart Cath and Coronary Angiography;  Surgeon: Peter M Swaziland, MD;  Location: [facility] INVASIVE CV LAB;  Service: Cardiovascular;  Laterality: N/A;   CHOLECYSTECTOMY     CHONDROPLASTY  08/15/2011   Procedure: CHONDROPLASTY;  Surgeon: Fuller Canada, MD;  Location: [facility];  Service: Orthopedics;  Laterality: Left;   COLONOSCOPY  06/27/2011   Procedure: COLONOSCOPY;  Surgeon: Malissa Hippo, MD;  [facility]: [facility];  Service: Endoscopy;  Laterality: N/A;  9:00 / Pt to be here at 9am for 10:45 procedure, benign polyps removed   COLONOSCOPY N/A 10/05/2014   Procedure: COLONOSCOPY;  Surgeon: Malissa Hippo, MD;  [facility]: [facility];  Service: Endoscopy;  Laterality: N/A;  930   COLONOSCOPY N/A 02/11/2018   Procedure: COLONOSCOPY;  Surgeon: Malissa Hippo, MD;  [facility];  Service: Endoscopy;  Laterality: N/A;  830   ESOPHAGOGASTRODUODENOSCOPY N/A 12/27/2015   Procedure: ESOPHAGOGASTRODUODENOSCOPY (EGD);  Surgeon: Malissa Hippo, MD;  [facility];  Service: Endoscopy;  Laterality: N/A;  3:00   ESOPHAGOGASTRODUODENOSCOPY (EGD) WITH PROPOFOL N/A 02/10/2020   Procedure: ESOPHAGOGASTRODUODENOSCOPY (EGD) WITH PROPOFOL;  Surgeon: Malissa Hippo, MD;  [facility];  Service: Endoscopy;  Laterality: N/A;  155   HERNIA REPAIR  1998   umbilical hernia   JOINT REPLACEMENT  2023   knee and shoulder   KNEE ARTHROSCOPY     left knee   KNEE ARTHROSCOPY     right knee    LUMBAR LAMINECTOMY/DECOMPRESSION MICRODISCECTOMY  09/14/2012   Procedure: LUMBAR LAMINECTOMY/DECOMPRESSION MICRODISCECTOMY 1 LEVEL;  Surgeon: Karn Cassis, MD;  Location: [facility];  Service: Neurosurgery;  Laterality: Right;  Right Lumbar three-four Diskectomy   neck fusion  2005   NECK SURGERY     PATELLA-FEMORAL ARTHROPLASTY Right 02/18/2022   Procedure: RIGHT KNEE PATELLA REPLACEMENT, CEMENTED;  Surgeon: Cammy Copa, MD;  Location: [facility];  Service:  Orthopedics;  Laterality: Right;   POLYPECTOMY  02/11/2018   Procedure: POLYPECTOMY;  Surgeon: Malissa Hippo, MD;  [facility];  Service: Endoscopy;;  colon   REVISION TOTAL SHOULDER TO REVERSE TOTAL SHOULDER Left 12/16/2018   Procedure: REVISION TOTAL SHOULDER TO REVERSE TOTAL SHOULDER;  Surgeon: Cammy Copa, MD;  Location: [facility];  Service: Orthopedics;  Laterality: Left;   SHOULDER ARTHROSCOPY WITH BICEPSTENOTOMY Left 09/23/2013   Procedure: SHOULDER ARTHROSCOPY WITH BICEPSTENOTOMY AND EXTENSIVE DEBRIDEMENT;  Surgeon: Vickki Hearing, MD;  Location: [facility];  Service: Orthopedics;  Laterality: Left;   SHOULDER ARTHROSCOPY WITH ROTATOR CUFF REPAIR Left 05/05/2014   Procedure: SHOULDER ARTHROSCOPY LIMITED DEBRIDEMENT;  Surgeon: Vickki Hearing, MD;  Location: [facility];  Service: Orthopedics;  Laterality: Left;   SHOULDER OPEN ROTATOR CUFF REPAIR Left 05/05/2014   Procedure: ROTATOR CUFF REPAIR SHOULDER OPEN;  Surgeon: Vickki Hearing, MD;  Location: [facility];  Service: Orthopedics;  Laterality: Left;   SHOULDER OPEN ROTATOR CUFF REPAIR Left 12/16/2018   Procedure: LEFT SHOULDER POSSIBLE SUBSCAPULARIS REPAIR VS. REVISION TO REVERSE TOTAL SHOULDER REPLACEMENT;  Surgeon: Cammy Copa, MD;  Location: [facility];  Service: Orthopedics;  Laterality: Left;   SHOULDER SURGERY Right    Open Mumford procedure   SPINAL FUSION     x2, 2003 and 2007   TOTAL KNEE ARTHROPLASTY Left 06/16/2017   Procedure: LEFT TOTAL KNEE ARTHROPLASTY;  Surgeon: Vickki Hearing, MD;  Location: [facility];  Service: Orthopedics;  Laterality: Left;   TOTAL KNEE ARTHROPLASTY Right 05/14/2021   Procedure: TOTAL KNEE ARTHROPLASTY;  Surgeon: Vickki Hearing, MD;  Location: [facility];  Service: Orthopedics;  Laterality: Right;   TOTAL SHOULDER ARTHROPLASTY Left 08/19/2018   Procedure: left shoulder replacement;  Surgeon: Cammy Copa, MD;  Location: [facility] OR;  Service: Orthopedics;  Laterality: Left;    Patient Active Problem List   Diagnosis Date Noted   Prediabetes 01/05/2023   History of anaphylaxis 11/13/2022   Idiopathic urticaria 11/03/2022   Angioedema 10/24/2022   Hives 10/24/2022   Spinal stenosis in cervical region 10/13/2022   DOE (dyspnea on exertion) 09/26/2022   Abnormal EKG 09/18/2022   Hypogonadism in male 07/07/2022   Preop cardiovascular exam 07/07/2022   Depression, recurrent (HCC) 06/06/2022   Chronic musculoskeletal pain 06/06/2022   Fatigue 06/06/2022   Current tobacco use 06/06/2022   Encounter to establish care 06/06/2022   S/P right knee surgery 02/18/2022   S/P TKR (total knee replacement), right 05/14/21 07/02/2021   Status post total right knee replacement 05/14/2021   Primary osteoarthritis of right knee    Renal cyst 10/03/2020   Frequency of micturition 10/03/2020   Benign prostatic hyperplasia with urinary obstruction 10/03/2020   IBS (  irritable bowel syndrome) 07/05/2020   Lumbar disc herniation 06/29/2019   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HCC)    Primary osteoarthritis, left shoulder    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   History of colonic polyps 11/26/2017   S/P total knee replacement, left 06/16/17 06/16/2017   Primary osteoarthritis of left knee    HLD (hyperlipidemia) 09/04/2016   Angina pectoris (HCC) 09/04/2016   OSA (obstructive sleep apnea) 09/04/2016   COPD (chronic obstructive pulmonary disease) (HCC) 09/04/2016   Rotator cuff tear 05/05/2014   S/P shoulder surgery 09/26/2013   Arthritis, shoulder region 09/26/2013   Bursitis, shoulder 09/26/2013   Synovitis of shoulder 09/26/2013   Labral tear of shoulder, degenerative 09/26/2013   Biceps tendon tear 09/26/2013   Rotator cuff syndrome of left shoulder 06/21/2013   Arthritis 06/21/2013   Patellofemoral arthritis of right knee 03/29/2013   Effusion of knee joint 03/29/2013   Bursitis/tendonitis, shoulder 03/10/2013   Effusion of knee joint, left 08/18/2011   Knee  pain 08/18/2011   Acute torn meniscus 07/30/2011   Old torn meniscus of knee 07/30/2011   Arthropathy, lower leg 10/15/2010   MEDIAL MENISCUS TEAR, RIGHT 10/15/2010   HIP PAIN 01/15/2010   DEGENERATIVE DISC DISEASE, LUMBOSACRAL SPINE W/RADICULOPATHY 01/15/2010   PLICA SYNDROME 08/09/2009   DERANGEMENT MENISCUS 07/09/2009   JOINT EFFUSION, LEFT KNEE 07/09/2009   Unilateral primary osteoarthritis, left knee 01/30/2009   KNEE PAIN 01/30/2009   Sprain of ankle 11/08/2007    PCP: Billie Lade, MD  REFERRING PROVIDER: Sherryl Manges, NP  REFERRING DIAG: M54.2 (ICD-10-CM) - Cervicalgia  THERAPY DIAG:  Abnormal posture  Cervicalgia  Muscle weakness (generalized)  Rationale for Evaluation and Treatment: Rehabilitation  ONSET DATE: 10/13/22  SUBJECTIVE:                                                                                                                                                                                                         SUBJECTIVE STATEMENT: Pt states his headaches have calmed down to a 7. Reports he does feel the needling has helped. Exercises have been going well and has been doing daily. Pt states he feels his neck is moving better. Has been starting to use the bone stimulator (8 hrs/day)  From eval: Pt states he had a fusion in his neck in February and didn't think it's healing. Pt reports his headaches are crazy. It gives him a fit every day. Pt is to get a bone stimulator for his neck. Gets a lot of pulling in his neck. States his head is crooked.  Pt states he uses a riding lawnmower. Has not been doing any stretches. Pt states N/T into his shoulders Hand dominance: Right  PERTINENT HISTORY:   anterior cervical discectomy fusion-C3-C4, C4-C5 and removal of hardware at C5-C6 on 10/13/22   PAIN:  Are you having pain? Yes: NPRS scale: 0 currently/10 Pain location: R>L neck Pain description: sharp Aggravating factors: Keeping head  straight Relieving factors: Hydrocodone, laying down   PRECAUTIONS: None  RED FLAGS: None     WEIGHT BEARING RESTRICTIONS: No  FALLS:  Has patient fallen in last 6 months? No  LIVING ENVIRONMENT: Lives with: lives with their spouse Lives in: House/apartment Stairs: No Has following equipment at home: None  OCCUPATION: Retired. Does yard work (uses Electrical engineer), house chores  PLOF: Independent  PATIENT GOALS: Decrease muscle pain/headaches, improve ROM, cervical fusion to heal  NEXT MD VISIT: n/a  OBJECTIVE:   DIAGNOSTIC FINDINGS:  MRI performed 05/08/23 but results are pending  PATIENT SURVEYS:  FOTO 41; predicted 49  COGNITION: Overall cognitive status: Within functional limits for tasks assessed  SENSATION: Reports N/T in bilat shoulders  POSTURE: rounded shoulders, forward head, and increased thoracic kyphosis  PALPATION: Increased muscle tension bilat suboccipitals, L>R mid scalenes, bilat cervical & thoracic paraspinals and bilat UTs,   Hypomobile thoracic spine  CERVICAL ROM:   Active ROM A/PROM (deg) eval  Flexion 25  Extension 15  Right lateral flexion 26  Left lateral flexion 26  Right rotation 38  Left rotation 55   (Blank rows = not tested)  UPPER EXTREMITY ROM:  Active ROM Right eval Left eval  Shoulder flexion    Shoulder extension    Shoulder abduction    Shoulder adduction    Shoulder extension    Shoulder internal rotation    Shoulder external rotation    Elbow flexion    Elbow extension    Wrist flexion    Wrist extension    Wrist ulnar deviation    Wrist radial deviation    Wrist pronation    Wrist supination     (Blank rows = not tested)  UPPER EXTREMITY MMT:  MMT Right eval Left eval  Shoulder flexion    Shoulder extension    Shoulder abduction    Shoulder adduction    Shoulder extension    Shoulder internal rotation    Shoulder external rotation    Middle trapezius    Lower trapezius    Elbow flexion     Elbow extension    Wrist flexion    Wrist extension    Wrist ulnar deviation    Wrist radial deviation    Wrist pronation    Wrist supination    Grip strength     (Blank rows = not tested)  CERVICAL SPECIAL TESTS:  Did not assess  FUNCTIONAL TESTS:  Did not assess  TODAY'S TREATMENT:  DATE:  06/05/23 Manual therapy STM & TPR scalenes, suboccipitals Skilled assessment and palpation for TPDN Trigger Point Dry-Needling  Treatment instructions: Expect mild to moderate muscle soreness. S/S of pneumothorax if dry needled over a lung field, and to seek immediate medical attention should they occur. Patient verbalized understanding of these instructions and education.  Patient Consent Given: Yes Education handout provided: Yes Muscles treated: bilat UT, suboccipital, L mid scalene Electrical stimulation performed: No Parameters: N/A Treatment response/outcome: Twitch response, decreased muscle tension Prone  "I", "T", "W", "Y" x10 each  Cervical retraction x10 Self care  TENS for home/muscle relaxation  Self massage with cane  05/22/23 See HEP below  Skilled assessment and palpation for TPDN Trigger Point Dry-Needling  Treatment instructions: Expect mild to moderate muscle soreness. S/S of pneumothorax if dry needled over a lung field, and to seek immediate medical attention should they occur. Patient verbalized understanding of these instructions and education.  Patient Consent Given: Yes Education handout provided: Yes Muscles treated: bilat UT, suboccipital, L mid scalene Electrical stimulation performed: No Parameters: N/A Treatment response/outcome: Twitch response, decreased muscle tension   PATIENT EDUCATION:  Education details: Exam findings, POC, TPDN, initial HEP Person educated: Patient and Spouse Education method: Explanation,  Demonstration, and Handouts Education comprehension: verbalized understanding, returned demonstration, and needs further education  HOME EXERCISE PROGRAM: Access Code: [email or web address] URL: [email or web address] Date: 06/05/2023 Prepared by: Vernon Prey April Kirstie Peri  Exercises - Hooklying Suboccipital Release with Fingers  - 1 x daily - 7 x weekly - 2 sets - 30 sec hold - Seated Cervical Flexion Stretch with Finger Support Behind Neck  - 1 x daily - 7 x weekly - 2 sets - 30 sec hold - Seated Levator Scapulae Stretch  - 1 x daily - 7 x weekly - 2 sets - 30 sec hold - Seated Upper Trapezius Stretch  - 1 x daily - 7 x weekly - 2 sets - 30 sec hold - Standing Upper Trapezius Mobilization with Small Ball  - 1 x daily - 7 x weekly - 2 sets - 30 sec hold - Seated Cervical Retraction  - 1 x daily - 7 x weekly - 2 sets - 10 reps - Prone Scapular Slide with Shoulder Extension  - 1 x daily - 7 x weekly - 2 sets - 10 reps - Prone Scapular Retraction Arms at Side  - 1 x daily - 7 x weekly - 2 sets - 10 reps - Prone Scapular Retraction Y  - 1 x daily - 7 x weekly - 2 sets - 10 reps  Patient Education - TENS Unit  ASSESSMENT:  CLINICAL IMPRESSION: Pt with improving neck mobility and pain. Tolerated TPDN well after last session - performed another round to address continued neck spasm/tightness. Added postural stabilization exercises for midback and cervical strengthening.   From eval: Patient is a 67 y.o. M who was seen today for physical therapy evaluation and treatment for cervicalgia. PMH significant for ACDF for C3-4, C4-5 in Feb 2024 but has had lumbar fusion, multiple shoulder and knee surgeries. Assessment significant for decreased cervical ROM, muscle pain/spasm, hypomobile thoracic spine and decreased postural stability affecting tolerance to upright postures for home and community tasks. Pt will benefit from PT to address these issues. Interested in dry needling to assist with muscle  tension, pain, and encourage more healing. Performed trial today and initiated gentle stretches and discussed massage to keep muscles from spasming.   OBJECTIVE IMPAIRMENTS: decreased activity tolerance, decreased balance, decreased endurance,  decreased mobility, decreased ROM, decreased strength, hypomobility, increased fascial restrictions, increased muscle spasms, impaired flexibility, impaired sensation, impaired UE functional use, improper body mechanics, postural dysfunction, and pain.   ACTIVITY LIMITATIONS: carrying, lifting, bending, sitting, standing, sleeping, toileting, dressing, reach over head, and hygiene/grooming  PARTICIPATION LIMITATIONS: meal prep, cleaning, laundry, driving, community activity, and yard work  PERSONAL FACTORS: Age, Fitness, Past/current experiences, and Time since onset of injury/illness/exacerbation are also affecting patient's functional outcome.   REHAB POTENTIAL: Good  CLINICAL DECISION MAKING: Evolving/moderate complexity  EVALUATION COMPLEXITY: Moderate   GOALS: Goals reviewed with patient? Yes  SHORT TERM GOALS: Target date: 06/19/2023   Pt will be ind with initial HEP Baseline:  Goal status: INITIAL  2.  Pt will have improved cervical ROM in all directions by at least 10 deg for driving tasks Baseline:  Goal status: INITIAL  3.  Pt will report >/=25% improvement in headaches/muscle tension Baseline:  Goal status: INITIAL   LONG TERM GOALS: Target date: 07/17/2023   Pt will be ind with progression and management of HEP Baseline:  Goal status: INITIAL  2.  Pt will demo at least 4/5 mid/low trap strength for improved postural stability  Baseline:  Goal status: INITIAL  3.  Pt will be able to maintain neutral neck position x15 min for home chores Baseline:  Goal status: INITIAL  4.  Pt will report >/=50% improvement in headaches/muscle tension Baseline:  Goal status: INITIAL  5.  Pt will have FOTO score >/=49 Baseline:   Goal status: INITIAL  PLAN:  PT FREQUENCY: 1-2x/week  PT DURATION: 8 weeks  PLANNED INTERVENTIONS: Therapeutic exercises, Therapeutic activity, Neuromuscular re-education, Balance training, Gait training, Patient/Family education, Self Care, Joint mobilization, Dry Needling, Electrical stimulation, Spinal mobilization, Cryotherapy, Moist heat, Taping, Manual therapy, and Re-evaluation  PLAN FOR NEXT SESSION: Assess response to TPDN. Review HEP. Progress postural strengthening as tolerated. Manual work/TPDN.   Rashaun Wichert April Ma L Shravya Wickwire, PT 06/05/2023, 1:53 PM

## 2023-06-09 MED ORDER — JATENZO 237 MG PO CAPS: 1.0000 | ORAL_CAPSULE | Freq: Two times a day (BID) | ORAL | 5 refills | Status: DC

## 2023-06-10 ENCOUNTER — Telehealth (HOSPITAL_COMMUNITY): Payer: Self-pay

## 2023-06-10 ENCOUNTER — Encounter (HOSPITAL_COMMUNITY): Payer: PPO

## 2023-06-10 NOTE — Telephone Encounter (Signed)
Pt did not arrive for scheduled appointment. No telephone call nor message preceeded this absence. Author attempted to contact pt via telephone number listed in chart. Spoke with pt's wife who reports having appointment on calendar for a difference day, expressed disappointment as pt was looking forward to coming to PT tomorrow. Pt asks about a reschedule, is offered an opening on 06/11/23 @ 10:15 which she confirms. Author coordinated scheduling of this with support staff.   Rosamaria Lints, PT Physical Therapist [facility] [place]  [phone]

## 2023-06-11 ENCOUNTER — Encounter (HOSPITAL_COMMUNITY): Payer: PPO

## 2023-06-12 ENCOUNTER — Other Ambulatory Visit: Payer: Self-pay | Admitting: Family Medicine

## 2023-06-12 ENCOUNTER — Ambulatory Visit (INDEPENDENT_AMBULATORY_CARE_PROVIDER_SITE_OTHER): Payer: PPO | Admitting: Family Medicine

## 2023-06-12 ENCOUNTER — Encounter: Payer: Self-pay | Admitting: Family Medicine

## 2023-06-12 VITALS — BP 126/82 | HR 91 | Ht 66.0 in | Wt 224.0 lb

## 2023-06-12 DIAGNOSIS — R109 Unspecified abdominal pain: Secondary | ICD-10-CM | POA: Insufficient documentation

## 2023-06-12 DIAGNOSIS — R7303 Prediabetes: Secondary | ICD-10-CM

## 2023-06-12 DIAGNOSIS — R1084 Generalized abdominal pain: Secondary | ICD-10-CM | POA: Diagnosis not present

## 2023-06-12 DIAGNOSIS — R300 Dysuria: Secondary | ICD-10-CM | POA: Insufficient documentation

## 2023-06-12 MED ORDER — ONDANSETRON HCL 4 MG PO TABS: 4.0000 mg | ORAL_TABLET | Freq: Three times a day (TID) | ORAL | 0 refills | Status: AC | PRN

## 2023-06-12 MED ORDER — METFORMIN HCL 500 MG PO TABS
500.0000 mg | ORAL_TABLET | Freq: Every day | ORAL | 2 refills | Status: DC
Start: 2023-06-12 — End: 2023-07-08

## 2023-06-12 NOTE — Patient Instructions (Signed)

## 2023-06-12 NOTE — Assessment & Plan Note (Signed)
Abdominal CT ordered, Advise patient to visit ED for worsening abdominal pain instead of waiting. Encourage increased oral fluid intake. Recommend a bland diet as tolerated, avoiding sugary or caffeinated beverages, as well as spicy, fatty foods, and alcohol. For pain management, the patient may take over-the-counter Tylenol. Instruct the patient to seek follow-up care if unable to keep food or fluids down for 24 hours, or if experiencing dizziness, fever, worsening, or persistent symptoms. The patient has verbalized understanding of the care plan, and all questions have been addressed.

## 2023-06-12 NOTE — Assessment & Plan Note (Signed)
Patient reported at home elevated glucose readings ranging from 200-300s with symptoms of polyuria, polydipsia, and polyphagia Started patient On Metformin 500 mg once daily Advise patient to follow up with Dr.Dixon for continued care.  Continued discussion on healthy eating habits:  Eat more fruits and vegetables: Aim for a variety of colors. Choose whole grains: Brown rice, oats, and whole-wheat bread. Limit unhealthy fats: Avoid trans fats; use olive or avocado oil instead. Include lean proteins: Opt for fish, chicken, beans, and legumes. Reduce sodium: Limit processed foods and add less salt. Stay hydrated: Drink plenty of water. Exercise regularly: Aim for at least 30 minutes of moderate exercise, like walking or cycling, 5 days a week.

## 2023-06-12 NOTE — Assessment & Plan Note (Signed)
Urinalysis, and urine culture ordered- Awaiting results will follow up. Explained to increase oral fluid intake. Drink 8 glasses of water daily.  Follow up for worsening or persistent symptoms. Patient verbalizes understanding regarding plan of care and all questions answered.

## 2023-06-12 NOTE — Progress Notes (Signed)
Patient Office Visit   Subjective   Patient ID: Christopher [name], male    DOB: [birth year]  Age: 67 y.o. MRN: [id number]  CC:  Chief Complaint  Patient presents with   Abdominal Pain    Patient complains of R side abdominal pain starting a week ago, with dysuria, hyperglycemia starting Sunday.     HPI Christopher [name] 67 year old male, presents to the clinic for worsening abdominal pain starting one week ago.  He  has a past medical history of Anxiety, Arthritis, Asthma, BPH (benign prostatic hyperplasia), Complication of anesthesia, Depression, GERD (gastroesophageal reflux disease), Gout, Headache(784.0), Heart murmur, History of hiatal hernia, Hyperlipidemia, IBS (irritable bowel syndrome), PONV (postoperative nausea and vomiting), Pre-diabetes, Sleep apnea, and Wears partial dentures.  Patient reports a new onset of abdominal pain that began within the past 7 days and has been rapidly worsening. The pain is constant and localized in the RLQ, generalized abdominal region, and RUQ, with a severity of 10/10. The pain is described as colicky, sharp, tearing, burning, and cramping, and radiates to both the RLQ and RUQ. Associated symptoms include dysuria, headaches, myalgias, and nausea. The patient denies constipation, diarrhea, hematochezia, hematuria, or vomiting. The pain is aggravated by eating and movement and is temporarily relieved by remaining still. He has taken acetaminophen, which provided mild relief.      Outpatient Encounter Medications as of 06/12/2023  Medication Sig   albuterol (VENTOLIN HFA) 108 (90 Base) MCG/ACT inhaler inhale 2 puffs into lungs every 6 hours as needed for wheezing or shortness of breath   cyclobenzaprine (FLEXERIL) 10 MG tablet Take 1 tablet (10 mg total) by mouth 3 (three) times daily as needed for muscle spasms. Do not take with Robaxin   EPINEPHrine 0.3 mg/0.3 mL IJ SOAJ injection Inject 0.3 mg into the muscle as needed for anaphylaxis.   famotidine  (PEPCID) 40 MG tablet Take 1 tablet (40 mg total) by mouth 2 (two) times daily.   fexofenadine (ALLEGRA ALLERGY) 180 MG tablet Take 2 tablets (360 mg total) by mouth in the morning and at bedtime.   gabapentin (NEURONTIN) 100 MG capsule    gabapentin (NEURONTIN) 300 MG capsule Take 300 mg by mouth 3 (three) times daily.   HYDROcodone-acetaminophen (NORCO/VICODIN) 5-325 MG tablet Take 1 tablet by mouth every 4 (four) hours as needed for moderate pain.   hydrOXYzine (VISTARIL) 25 MG capsule Take 1 capsule (25 mg total) by mouth at bedtime as needed for itching (hives).   meloxicam (MOBIC) 7.5 MG tablet Take 7.5 mg by mouth 2 (two) times daily.   metFORMIN (GLUCOPHAGE) 500 MG tablet Take 1 tablet (500 mg total) by mouth daily with breakfast.   montelukast (SINGULAIR) 10 MG tablet Take 1 tablet (10 mg total) by mouth at bedtime.   nitroGLYCERIN (NITROSTAT) 0.4 MG SL tablet PLACE (1) TABLET UNDER TONGUE EVERY 5 MINUTES UP TO (3) DOSES. IF NO RELIEF CALL 911.   ondansetron (ZOFRAN) 4 MG tablet Take 1 tablet (4 mg total) by mouth every 8 (eight) hours as needed for nausea.   pantoprazole (PROTONIX) 40 MG tablet TAKE (1) TABLET TWICE A DAY BEFORE MEALS. (Patient taking differently: Take 40 mg by mouth daily.)   rosuvastatin (CRESTOR) 20 MG tablet take 1 tablet by mouth once daily.   sertraline (ZOLOFT) 50 MG tablet Take 50 mg by mouth in the morning.   Testosterone Undecanoate (JATENZO) 237 MG CAPS Take 1 capsule (237 mg total) by mouth in the morning and at bedtime.  Tiotropium Bromide-Olodaterol (STIOLTO RESPIMAT) 2.5-2.5 MCG/ACT AERS Inhale 2 puffs into the lungs daily.   Facility-Administered Encounter Medications as of 06/12/2023  Medication   omalizumab Geoffry Paradise) injection 300 mg    Past Surgical History:  Procedure Laterality Date   ANTERIOR CERVICAL DECOMP/DISCECTOMY FUSION N/A 10/13/2022   Procedure: ANTERIOR CERVICAL DISECTOMY FUSION - CERVICAL THREE-CERVICAL FOUR - CERVICAL FOUR-CERVICAL  FIVE REMOVAL OF HARDWARE CERVICAL FIVE-CERVICAL SIX;  Surgeon: Donalee Citrin, MD;  Location: [facility];  Service: Neurosurgery;  Laterality: N/A;   APPENDECTOMY     BACK SURGERY  2002   neck and back fusion   BIOPSY  12/27/2015   Procedure: BIOPSY;  Surgeon: Malissa Hippo, MD;  Location: [facility];  Service: Endoscopy;;  Fundus biopsies and duodenal biopsies   BIOPSY  02/10/2020   Procedure: BIOPSY;  Surgeon: Malissa Hippo, MD;  Location: [facility];  Service: Endoscopy;;  antral   CARDIAC CATHETERIZATION     CARDIAC CATHETERIZATION N/A 09/04/2016   Procedure: Right/Left Heart Cath and Coronary Angiography;  Surgeon: Peter M Swaziland, MD;  Location: [facility] INVASIVE CV LAB;  Service: Cardiovascular;  Laterality: N/A;   CHOLECYSTECTOMY     CHONDROPLASTY  08/15/2011   Procedure: CHONDROPLASTY;  Surgeon: Fuller Canada, MD;  Location: [facility];  Service: Orthopedics;  Laterality: Left;   COLONOSCOPY  06/27/2011   Procedure: COLONOSCOPY;  Surgeon: Malissa Hippo, MD;  Location: [facility];  Service: Endoscopy;  Laterality: N/A;  9:00 / Pt to be here at 9am for 10:45 procedure, benign polyps removed   COLONOSCOPY N/A 10/05/2014   Procedure: COLONOSCOPY;  Surgeon: Malissa Hippo, MD;  [facility]: [facility];  Service: Endoscopy;  Laterality: N/A;  930   COLONOSCOPY N/A 02/11/2018   Procedure: COLONOSCOPY;  Surgeon: Malissa Hippo, MD;  [facility];  Service: Endoscopy;  Laterality: N/A;  830   ESOPHAGOGASTRODUODENOSCOPY N/A 12/27/2015   Procedure: ESOPHAGOGASTRODUODENOSCOPY (EGD);  Surgeon: Malissa Hippo, MD;  [facility];  Service: Endoscopy;  Laterality: N/A;  3:00   ESOPHAGOGASTRODUODENOSCOPY (EGD) WITH PROPOFOL N/A 02/10/2020   Procedure: ESOPHAGOGASTRODUODENOSCOPY (EGD) WITH PROPOFOL;  Surgeon: Malissa Hippo, MD;  [facility];  Service: Endoscopy;  Laterality: N/A;  155   HERNIA REPAIR  1998   umbilical hernia   JOINT REPLACEMENT  2023   knee  and shoulder   KNEE ARTHROSCOPY     left knee   KNEE ARTHROSCOPY     right knee    LUMBAR LAMINECTOMY/DECOMPRESSION MICRODISCECTOMY  09/14/2012   Procedure: LUMBAR LAMINECTOMY/DECOMPRESSION MICRODISCECTOMY 1 LEVEL;  Surgeon: Karn Cassis, MD;  Location: [facility];  Service: Neurosurgery;  Laterality: Right;  Right Lumbar three-four Diskectomy   neck fusion  2005   NECK SURGERY     PATELLA-FEMORAL ARTHROPLASTY Right 02/18/2022   Procedure: RIGHT KNEE PATELLA REPLACEMENT, CEMENTED;  Surgeon: Cammy Copa, MD;  Location: [facility];  Service: Orthopedics;  Laterality: Right;   POLYPECTOMY  02/11/2018   Procedure: POLYPECTOMY;  Surgeon: Malissa Hippo, MD;  [facility];  Service: Endoscopy;;  colon   REVISION TOTAL SHOULDER TO REVERSE TOTAL SHOULDER Left 12/16/2018   Procedure: REVISION TOTAL SHOULDER TO REVERSE TOTAL SHOULDER;  Surgeon: Cammy Copa, MD;  Location: [facility];  Service: Orthopedics;  Laterality: Left;   SHOULDER ARTHROSCOPY WITH BICEPSTENOTOMY Left 09/23/2013   Procedure: SHOULDER ARTHROSCOPY WITH BICEPSTENOTOMY AND EXTENSIVE DEBRIDEMENT;  Surgeon: Vickki Hearing, MD;  Location: [facility];  Service: Orthopedics;  Laterality: Left;   SHOULDER ARTHROSCOPY WITH ROTATOR CUFF REPAIR Left 05/05/2014   Procedure: SHOULDER ARTHROSCOPY LIMITED DEBRIDEMENT;  Surgeon: Vickki Hearing, MD;  Location: [facility];  Service: Orthopedics;  Laterality: Left;   SHOULDER OPEN ROTATOR CUFF REPAIR Left 05/05/2014   Procedure: ROTATOR CUFF REPAIR SHOULDER OPEN;  Surgeon: Vickki Hearing, MD;  Location: [facility];  Service: Orthopedics;  Laterality: Left;   SHOULDER OPEN ROTATOR CUFF REPAIR Left 12/16/2018   Procedure: LEFT SHOULDER POSSIBLE SUBSCAPULARIS REPAIR VS. REVISION TO REVERSE TOTAL SHOULDER REPLACEMENT;  Surgeon: Cammy Copa, MD;  Location: [facility];  Service: Orthopedics;  Laterality: Left;   SHOULDER SURGERY Right    Open Mumford procedure   SPINAL FUSION      x2, 2003 and 2007   TOTAL KNEE ARTHROPLASTY Left 06/16/2017   Procedure: LEFT TOTAL KNEE ARTHROPLASTY;  Surgeon: Vickki Hearing, MD;  Location: [facility];  Service: Orthopedics;  Laterality: Left;   TOTAL KNEE ARTHROPLASTY Right 05/14/2021   Procedure: TOTAL KNEE ARTHROPLASTY;  Surgeon: Vickki Hearing, MD;  Location: [facility];  Service: Orthopedics;  Laterality: Right;   TOTAL SHOULDER ARTHROPLASTY Left 08/19/2018   Procedure: left shoulder replacement;  Surgeon: Cammy Copa, MD;  Location: [facility] OR;  Service: Orthopedics;  Laterality: Left;    Review of Systems  Constitutional:  Negative for chills and fever.  Respiratory:  Negative for cough and shortness of breath.   Cardiovascular:  Negative for chest pain, palpitations and leg swelling.  Gastrointestinal:  Positive for abdominal pain and nausea. Negative for blood in stool, constipation, diarrhea, heartburn, melena and vomiting.  Neurological:  Positive for headaches.  Endo/Heme/Allergies:  Positive for polydipsia.      Objective    BP 126/82   Pulse 91   Ht 5\' 6"  (1.676 m)   Wt 224 lb (101.6 kg)   SpO2 95%   BMI 36.15 kg/m   Physical Exam Vitals reviewed.  Constitutional:      General: He is not in acute distress.    Appearance: Normal appearance. He is not ill-appearing, toxic-appearing or diaphoretic.  HENT:     Head: Normocephalic.  Eyes:     General:        Right eye: No discharge.        Left eye: No discharge.     Conjunctiva/sclera: Conjunctivae normal.  Cardiovascular:     Rate and Rhythm: Normal rate.     Pulses: Normal pulses.     Heart sounds: Normal heart sounds.  Pulmonary:     Effort: Pulmonary effort is normal. No respiratory distress.     Breath sounds: Normal breath sounds.  Abdominal:     General: Bowel sounds are normal.     Palpations: Abdomen is soft.     Tenderness: There is abdominal tenderness in the right upper quadrant and right lower quadrant. There is guarding and  rebound. There is no right CVA tenderness or left CVA tenderness. Negative signs include Murphy's sign and McBurney's sign.  Musculoskeletal:        General: Normal range of motion.     Cervical back: Normal range of motion.  Skin:    General: Skin is warm and dry.     Capillary Refill: Capillary refill takes less than 2 seconds.  Neurological:     General: No focal deficit present.     Mental Status: He is alert and oriented to person, place, and time.     Coordination: Coordination normal.  Gait: Gait normal.  Psychiatric:        Mood and Affect: Mood normal.        Behavior: Behavior normal.       Assessment & Plan:  Dysuria Assessment & Plan: Urinalysis, and urine culture ordered- Awaiting results will follow up. Explained to increase oral fluid intake. Drink 8 glasses of water daily.  Follow up for worsening or persistent symptoms. Patient verbalizes understanding regarding plan of care and all questions answered.   Orders: -     Urine Culture -     Urinalysis  Generalized abdominal pain Assessment & Plan: Abdominal CT ordered, Advise patient to visit ED for worsening abdominal pain instead of waiting. Encourage increased oral fluid intake. Recommend a bland diet as tolerated, avoiding sugary or caffeinated beverages, as well as spicy, fatty foods, and alcohol. For pain management, the patient may take over-the-counter Tylenol. Instruct the patient to seek follow-up care if unable to keep food or fluids down for 24 hours, or if experiencing dizziness, fever, worsening, or persistent symptoms. The patient has verbalized understanding of the care plan, and all questions have been addressed.  Orders: -     CT ABDOMEN PELVIS WO CONTRAST; Future  Prediabetes Assessment & Plan: Patient reported at home elevated glucose readings ranging from 200-300s with symptoms of polyuria, polydipsia, and polyphagia Started patient On Metformin 500 mg once daily Advise patient to follow up  with Dr.Dixon for continued care.  Continued discussion on healthy eating habits:  Eat more fruits and vegetables: Aim for a variety of colors. Choose whole grains: Brown rice, oats, and whole-wheat bread. Limit unhealthy fats: Avoid trans fats; use olive or avocado oil instead. Include lean proteins: Opt for fish, chicken, beans, and legumes. Reduce sodium: Limit processed foods and add less salt. Stay hydrated: Drink plenty of water. Exercise regularly: Aim for at least 30 minutes of moderate exercise, like walking or cycling, 5 days a week.    Other orders -     metFORMIN HCl; Take 1 tablet (500 mg total) by mouth daily with breakfast.  Dispense: 90 tablet; Refill: 2 -     Ondansetron HCl; Take 1 tablet (4 mg total) by mouth every 8 (eight) hours as needed for nausea.  Dispense: 20 tablet; Refill: 0    Return if symptoms worsen or fail to improve.   Cruzita Lederer Newman Nip, FNP

## 2023-06-15 ENCOUNTER — Ambulatory Visit (HOSPITAL_COMMUNITY)
Admission: RE | Admit: 2023-06-15 | Discharge: 2023-06-15 | Disposition: A | Discharge status: Home / Self Care | Payer: PPO | Source: Ambulatory Visit | Attending: Family Medicine | Admitting: Family Medicine

## 2023-06-15 ENCOUNTER — Ambulatory Visit: Payer: PPO

## 2023-06-15 ENCOUNTER — Telehealth: Payer: Self-pay

## 2023-06-15 DIAGNOSIS — L501 Idiopathic urticaria: Secondary | ICD-10-CM | POA: Diagnosis not present

## 2023-06-15 DIAGNOSIS — R1084 Generalized abdominal pain: Secondary | ICD-10-CM | POA: Insufficient documentation

## 2023-06-15 LAB — URINE CULTURE: Organism ID, Bacteria: NO GROWTH

## 2023-06-15 NOTE — Telephone Encounter (Signed)
PA done for [employer] (Key: [facility])

## 2023-06-15 NOTE — Telephone Encounter (Signed)
Wife called and notified of PA started for [facility] [address]. Wife voiced understanding.

## 2023-06-15 NOTE — Progress Notes (Signed)
Please inform patient  Your imaging shows no major issues in the abdomen or pelvis. There are a few findings to note:  Mild coronary artery atherosclerosis (build-up of plaque in the heart arteries). Fatty liver (hepatic steatosis), which means there is excess fat in your liver. Fatty tissue in your pancreas but no signs of inflammation. Kidney cysts, which are benign and do not require follow-up. Aortic atherosclerosis, meaning there is some plaque build-up in your abdominal aorta, but no aneurysm. Overall, nothing urgently concerning was found, and no additional imaging is needed at this time.

## 2023-06-17 ENCOUNTER — Ambulatory Visit (HOSPITAL_COMMUNITY): Payer: PPO

## 2023-06-17 DIAGNOSIS — M6281 Muscle weakness (generalized): Secondary | ICD-10-CM

## 2023-06-17 DIAGNOSIS — R293 Abnormal posture: Secondary | ICD-10-CM | POA: Diagnosis not present

## 2023-06-17 DIAGNOSIS — M542 Cervicalgia: Secondary | ICD-10-CM

## 2023-06-17 LAB — URINALYSIS
Bilirubin, UA: NEGATIVE
Ketones, UA: NEGATIVE
Leukocytes,UA: NEGATIVE
Nitrite, UA: NEGATIVE
Protein,UA: NEGATIVE
Specific Gravity, UA: 1.015 (ref 1.005–1.030)
Urobilinogen, Ur: 0.2 mg/dL (ref 0.2–1.0)
pH, UA: 5.5 (ref 5.0–7.5)

## 2023-06-17 NOTE — Telephone Encounter (Signed)
Per covermymeds- approval obtained for [facility].

## 2023-06-17 NOTE — Therapy (Signed)
OUTPATIENT PHYSICAL THERAPY TREATMENT   Patient Name: Christopher [name] MRN: [id number] DOB:1955/12/15, 67 y.o., male Today's Date: 06/17/2023  END OF SESSION:  PT End of Session - 06/17/23 1509     Visit Number 3    Number of Visits 16    Date for PT Re-Evaluation 07/17/23    Authorization Type [employer]    Progress Note Due on Visit 10    PT Start Time 1437    PT Stop Time 1511    PT Time Calculation (min) 34 min    Activity Tolerance Patient tolerated treatment well;No increased pain    Behavior During Therapy WFL for tasks assessed/performed              Past Medical History:  Diagnosis Date   Anxiety    Arthritis    Asthma    BPH (benign prostatic hyperplasia)    Complication of anesthesia    pt had a hard time being able to move after spinal anesthesia , 3-4 hours   Depression    GERD (gastroesophageal reflux disease)    Gout    no meds   Headache(784.0)    otc meds prn   Heart murmur    dx as a child, no problems as an adult   History of hiatal hernia    Hyperlipidemia    IBS (irritable bowel syndrome)    PONV (postoperative nausea and vomiting)    Pre-diabetes    Borderline, diet and exercise, no med   Sleep apnea    uses CIPAP machine at night   Wears partial dentures    bottom partial   Past Surgical History:  Procedure Laterality Date   ANTERIOR CERVICAL DECOMP/DISCECTOMY FUSION N/A 10/13/2022   Procedure: ANTERIOR CERVICAL DISECTOMY FUSION - CERVICAL THREE-CERVICAL FOUR - CERVICAL FOUR-CERVICAL FIVE REMOVAL OF HARDWARE CERVICAL FIVE-CERVICAL SIX;  Surgeon: Donalee Citrin, MD;  Location: [facility];  Service: Neurosurgery;  Laterality: N/A;   APPENDECTOMY     BACK SURGERY  2002   neck and back fusion   BIOPSY  12/27/2015   Procedure: BIOPSY;  Surgeon: Malissa Hippo, MD;  Location: [facility];  Service: Endoscopy;;  Fundus biopsies and duodenal biopsies   BIOPSY  02/10/2020   Procedure: BIOPSY;  Surgeon: Malissa Hippo, MD;  Location:  [facility];  Service: Endoscopy;;  antral   CARDIAC CATHETERIZATION     CARDIAC CATHETERIZATION N/A 09/04/2016   Procedure: Right/Left Heart Cath and Coronary Angiography;  Surgeon: Peter M Swaziland, MD;  Location: [facility] INVASIVE CV [facility];  Service: Cardiovascular;  Laterality: N/A;   CHOLECYSTECTOMY     CHONDROPLASTY  08/15/2011   Procedure: CHONDROPLASTY;  Surgeon: Fuller Canada, MD;  Location: [facility];  Service: Orthopedics;  Laterality: Left;   COLONOSCOPY  06/27/2011   Procedure: COLONOSCOPY;  Surgeon: Malissa Hippo, MD;  [facility]: [facility];  Service: Endoscopy;  Laterality: N/A;  9:00 / Pt to be here at 9am for 10:45 procedure, benign polyps removed   COLONOSCOPY N/A 10/05/2014   Procedure: COLONOSCOPY;  Surgeon: Malissa Hippo, MD;  [facility]: [facility];  Service: Endoscopy;  Laterality: N/A;  930   COLONOSCOPY N/A 02/11/2018   Procedure: COLONOSCOPY;  Surgeon: Malissa Hippo, MD;  [facility]: [facility];  Service: Endoscopy;  Laterality: N/A;  830   ESOPHAGOGASTRODUODENOSCOPY N/A 12/27/2015   Procedure: ESOPHAGOGASTRODUODENOSCOPY (EGD);  Surgeon: Malissa Hippo, MD;  [facility];  Service: Endoscopy;  Laterality: N/A;  3:00  ESOPHAGOGASTRODUODENOSCOPY (EGD) WITH PROPOFOL N/A 02/10/2020   Procedure: ESOPHAGOGASTRODUODENOSCOPY (EGD) WITH PROPOFOL;  Surgeon: Malissa Hippo, MD;  [facility];  Service: Endoscopy;  Laterality: N/A;  155   HERNIA REPAIR  1998   umbilical hernia   JOINT REPLACEMENT  2023   knee and shoulder   KNEE ARTHROSCOPY     left knee   KNEE ARTHROSCOPY     right knee    LUMBAR LAMINECTOMY/DECOMPRESSION MICRODISCECTOMY  09/14/2012   Procedure: LUMBAR LAMINECTOMY/DECOMPRESSION MICRODISCECTOMY 1 LEVEL;  Surgeon: Karn Cassis, MD;  Location: [facility];  Service: Neurosurgery;  Laterality: Right;  Right Lumbar three-four Diskectomy   neck fusion  2005   NECK SURGERY     PATELLA-FEMORAL ARTHROPLASTY Right 02/18/2022    Procedure: RIGHT KNEE PATELLA REPLACEMENT, CEMENTED;  Surgeon: Cammy Copa, MD;  Location: [facility];  Service: Orthopedics;  Laterality: Right;   POLYPECTOMY  02/11/2018   Procedure: POLYPECTOMY;  Surgeon: Malissa Hippo, MD;  [facility];  Service: Endoscopy;;  colon   REVISION TOTAL SHOULDER TO REVERSE TOTAL SHOULDER Left 12/16/2018   Procedure: REVISION TOTAL SHOULDER TO REVERSE TOTAL SHOULDER;  Surgeon: Cammy Copa, MD;  Location: [facility];  Service: Orthopedics;  Laterality: Left;   SHOULDER ARTHROSCOPY WITH BICEPSTENOTOMY Left 09/23/2013   Procedure: SHOULDER ARTHROSCOPY WITH BICEPSTENOTOMY AND EXTENSIVE DEBRIDEMENT;  Surgeon: Vickki Hearing, MD;  Location: [facility];  Service: Orthopedics;  Laterality: Left;   SHOULDER ARTHROSCOPY WITH ROTATOR CUFF REPAIR Left 05/05/2014   Procedure: SHOULDER ARTHROSCOPY LIMITED DEBRIDEMENT;  Surgeon: Vickki Hearing, MD;  Location: [facility];  Service: Orthopedics;  Laterality: Left;   SHOULDER OPEN ROTATOR CUFF REPAIR Left 05/05/2014   Procedure: ROTATOR CUFF REPAIR SHOULDER OPEN;  Surgeon: Vickki Hearing, MD;  Location: [facility];  Service: Orthopedics;  Laterality: Left;   SHOULDER OPEN ROTATOR CUFF REPAIR Left 12/16/2018   Procedure: LEFT SHOULDER POSSIBLE SUBSCAPULARIS REPAIR VS. REVISION TO REVERSE TOTAL SHOULDER REPLACEMENT;  Surgeon: Cammy Copa, MD;  Location: [facility];  Service: Orthopedics;  Laterality: Left;   SHOULDER SURGERY Right    Open Mumford procedure   SPINAL FUSION     x2, 2003 and 2007   TOTAL KNEE ARTHROPLASTY Left 06/16/2017   Procedure: LEFT TOTAL KNEE ARTHROPLASTY;  Surgeon: Vickki Hearing, MD;  Location: [facility];  Service: Orthopedics;  Laterality: Left;   TOTAL KNEE ARTHROPLASTY Right 05/14/2021   Procedure: TOTAL KNEE ARTHROPLASTY;  Surgeon: Vickki Hearing, MD;  Location: [facility];  Service: Orthopedics;  Laterality: Right;   TOTAL SHOULDER ARTHROPLASTY Left 08/19/2018   Procedure: left  shoulder replacement;  Surgeon: Cammy Copa, MD;  Location: [facility] OR;  Service: Orthopedics;  Laterality: Left;   Patient Active Problem List   Diagnosis Date Noted   Abdominal pain 06/12/2023   Dysuria 06/12/2023   Prediabetes 01/05/2023   History of anaphylaxis 11/13/2022   Idiopathic urticaria 11/03/2022   Angioedema 10/24/2022   Hives 10/24/2022   Spinal stenosis in cervical region 10/13/2022   DOE (dyspnea on exertion) 09/26/2022   Abnormal EKG 09/18/2022   Hypogonadism in male 07/07/2022   Preop cardiovascular exam 07/07/2022   Depression, recurrent (HCC) 06/06/2022   Chronic musculoskeletal pain 06/06/2022   Fatigue 06/06/2022   Current tobacco use 06/06/2022   Encounter to establish care 06/06/2022   S/P right knee surgery 02/18/2022   S/P TKR (total knee replacement), right 05/14/21 07/02/2021   Status post total right knee replacement 05/14/2021  Primary osteoarthritis of right knee    Renal cyst 10/03/2020   Frequency of micturition 10/03/2020   Benign prostatic hyperplasia with urinary obstruction 10/03/2020   IBS (irritable bowel syndrome) 07/05/2020   Lumbar disc herniation 06/29/2019   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HCC)    Primary osteoarthritis, left shoulder    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   History of colonic polyps 11/26/2017   S/P total knee replacement, left 06/16/17 06/16/2017   Primary osteoarthritis of left knee    HLD (hyperlipidemia) 09/04/2016   Angina pectoris (HCC) 09/04/2016   OSA (obstructive sleep apnea) 09/04/2016   COPD (chronic obstructive pulmonary disease) (HCC) 09/04/2016   Rotator cuff tear 05/05/2014   S/P shoulder surgery 09/26/2013   Arthritis, shoulder region 09/26/2013   Bursitis, shoulder 09/26/2013   Synovitis of shoulder 09/26/2013   Labral tear of shoulder, degenerative 09/26/2013   Biceps tendon tear 09/26/2013   Rotator cuff syndrome of left shoulder 06/21/2013   Arthritis 06/21/2013   Patellofemoral  arthritis of right knee 03/29/2013   Effusion of knee joint 03/29/2013   Bursitis/tendonitis, shoulder 03/10/2013   Effusion of knee joint, left 08/18/2011   Knee pain 08/18/2011   Acute torn meniscus 07/30/2011   Old torn meniscus of knee 07/30/2011   Arthropathy, lower leg 10/15/2010   MEDIAL MENISCUS TEAR, RIGHT 10/15/2010   HIP PAIN 01/15/2010   DEGENERATIVE DISC DISEASE, LUMBOSACRAL SPINE W/RADICULOPATHY 01/15/2010   PLICA SYNDROME 08/09/2009   DERANGEMENT MENISCUS 07/09/2009   JOINT EFFUSION, LEFT KNEE 07/09/2009   Unilateral primary osteoarthritis, left knee 01/30/2009   KNEE PAIN 01/30/2009   Sprain of ankle 11/08/2007    PCP: Billie Lade, MD  REFERRING PROVIDER: Sherryl Manges, NP  REFERRING DIAG: M54.2 (ICD-10-CM) - Cervicalgia  THERAPY DIAG:  Abnormal posture  Cervicalgia  Muscle weakness (generalized)  Rationale for Evaluation and Treatment: Rehabilitation  ONSET DATE: 10/13/22  SUBJECTIVE:                                                                                                                                                                                                         SUBJECTIVE STATEMENT: Pt has missed a visit or 2 due to scheduling difficulty, and medical attention required for his hernia related ABD pain. HEP is performed without difficulty. He wears his TENS unit all day long, feels it to be helpful. Pt wants to do more dry needling, found it to be helpful.   From eval: Hand dominance: Right  PERTINENT HISTORY:  Jojuan Alverson is a 67yoM referred to OPPT after ongoing pain,  discomfort. Pt states he had a fusion in his neck in February 2024 and didn't 'think it's healing.' Pt reports his headaches are also persistent. It gives him a fit every day. Pt is to get a bone stimulator for his neck. Gets a lot of pulling in his neck. States his head is crooked. Pt states he uses a riding lawnmower. Has not been doing any stretches. Pt  states N/T into his shouldersanterior cervical discectomy fusion C3/4, C4/5 and removal of hardware at C5/6 on 10/13/22. PMH: bilat TKA, bilat TSA.  PAIN:  Are you having pain? ~3-4/10  PRECAUTIONS: None  WEIGHT BEARING RESTRICTIONS: No  FALLS:  Has patient fallen in last 6 months? No  PATIENT GOALS: Decrease muscle pain/headaches, improve ROM, cervical fusion to heal  NEXT MD VISIT: n/a  OBJECTIVE:   DIAGNOSTIC FINDINGS:  MRI September 2024  FINDINGS: Alignment: Straightening.  No substantial sagittal subluxation.   Skull base and vertebrae: C3-C5 ACDF without evidence of bony fusion. Slight lucency around the left C3 screw which is slightly proud. This could represent early loosening. No evidence of hardware fracture or bony fracture.   Soft tissues and spinal canal: No prevertebral fluid or swelling. No visible canal hematoma.   Disc levels: No evidence of high-grade bony canal stenosis. Left greater than right foraminal stenosis at C3-C4.   Upper chest: Visualized lung apices are clear.   IMPRESSION: 1. C3-C5 ACDF without evidence of bony fusion. Slight lucency around the left C3 screw which is slightly proud. This could represent early loosening. 2. No evidence of acute fracture or malalignment. 3. No evidence of high-grade bony canal stenosis. Left greater than right foraminal stenosis at C3-C4. MRI could better assess the canal foramina of aorta.     Electronically Signed   By: Feliberto Harts M.D.   On: 05/25/2023 12:45   TODAY'S TREATMENT:                                                                                                                              DATE:   06/17/23 -review of symptoms response from last session, dry needling, use of TENS unit at home, HEP *pt reports lying in prone lat time for DN was painful for ABD/hernia -pt set up in recliner position on plinth, HOB at 38 degrees -DN to Left Upper trapezius, 0.47mmx60mm, near  immediate twitch response on first stick x3, quite intense, moreso prior that last session despite smaller needle diameter.  -DN to Right Upper trapezius, 0.71mmx60mm, quite responsive, but twitches too deep to visualize to author.  -pin and stretch to bilat UT paired with CL cervical rotation 2x15 bilat  -manual release and stretch to bilat suboccipital region, then C0/1 extension mobilization x60sec -seated edge of plinth: capital flexion stretch 2x40sec with bilat hand overpressure (C0/1 flexion) -end range rotation nodding mobilization 1x20 bilat     PATIENT EDUCATION: will await response from today, then modify HEP as appropriate next session.  Person educated: Patient Education method: Explanation, Demonstration, and Handouts Education comprehension: verbalized understanding, returned demonstration, and needs further education  HOME EXERCISE PROGRAM: Access Code: [email or web address] URL: [email or web address] Date: 06/05/2023 Prepared by: Vernon Prey April Kirstie Peri  Exercises - Hooklying Suboccipital Release with Fingers  - 1 x daily - 7 x weekly - 2 sets - 30 sec hold - Seated Cervical Flexion Stretch with Finger Support Behind Neck  - 1 x daily - 7 x weekly - 2 sets - 30 sec hold - Seated Levator Scapulae Stretch  - 1 x daily - 7 x weekly - 2 sets - 30 sec hold - Seated Upper Trapezius Stretch  - 1 x daily - 7 x weekly - 2 sets - 30 sec hold - Standing Upper Trapezius Mobilization with Small Ball  - 1 x daily - 7 x weekly - 2 sets - 30 sec hold - Seated Cervical Retraction  - 1 x daily - 7 x weekly - 2 sets - 10 reps - Prone Scapular Slide with Shoulder Extension  - 1 x daily - 7 x weekly - 2 sets - 10 reps - Prone Scapular Retraction Arms at Side  - 1 x daily - 7 x weekly - 2 sets - 10 reps - Prone Scapular Retraction Y  - 1 x daily - 7 x weekly - 2 sets - 10 reps  t  ASSESSMENT:  CLINICAL IMPRESSION: Pt treports overall improvement continues to be impressive.  Compliance with HEP and TENS has been of benefit. Continued with needling and concurrent soft tissue releases today, UT heightened in sensivity compared to prior session. PT will help address deficits and achieve LT goals of care.   OBJECTIVE IMPAIRMENTS: decreased activity tolerance, decreased balance, decreased endurance, decreased mobility, decreased ROM, decreased strength, hypomobility, increased fascial restrictions, increased muscle spasms, impaired flexibility, impaired sensation, impaired UE functional use, improper body mechanics, postural dysfunction, and pain.   ACTIVITY LIMITATIONS: carrying, lifting, bending, sitting, standing, sleeping, toileting, dressing, reach over head, and hygiene/grooming  PARTICIPATION LIMITATIONS: meal prep, cleaning, laundry, driving, community activity, and yard work  PERSONAL FACTORS: Age, Fitness, Past/current experiences, and Time since onset of injury/illness/exacerbation are also affecting patient's functional outcome.   REHAB POTENTIAL: Good  CLINICAL DECISION MAKING: Evolving/moderate complexity  EVALUATION COMPLEXITY: Moderate   GOALS: Goals reviewed with patient? Yes  SHORT TERM GOALS: Target date: 06/19/2023   Pt will be ind with initial HEP Baseline:  Goal status: INITIAL  2.  Pt will have improved cervical ROM in all directions by at least 10 deg for driving tasks Baseline:  Goal status: INITIAL  3.  Pt will report >/=25% improvement in headaches/muscle tension Baseline:  Goal status: INITIAL   LONG TERM GOALS: Target date: 07/17/2023   Pt will be ind with progression and management of HEP Baseline:  Goal status: INITIAL  2.  Pt will demo at least 4/5 mid/low trap strength for improved postural stability  Baseline:  Goal status: INITIAL  3.  Pt will be able to maintain neutral neck position x15 min for home chores Baseline:  Goal status: INITIAL  4.  Pt will report >/=50% improvement in headaches/muscle  tension Baseline:  Goal status: INITIAL  5.  Pt will have FOTO score >/=49 Baseline:  Goal status: INITIAL  PLAN:  PT FREQUENCY: 1-2x/week  PT DURATION: 8 weeks  PLANNED INTERVENTIONS: Therapeutic exercises, Therapeutic activity, Neuromuscular re-education, Balance training, Gait training, Patient/Family education, Self Care, Joint mobilization, Dry Needling, Electrical stimulation, Spinal mobilization, Cryotherapy, Moist heat,  Taping, Manual therapy, and Re-evaluation  PLAN FOR NEXT SESSION: Assess response to TPDN. Review HEP. Progress postural strengthening as tolerated. Manual work/TPDN.   Claudis Giovanelli C, PT 06/17/2023, 3:11 PM  Rosamaria Lints, PT Physical Therapist [facility] [place]  [phone]

## 2023-06-24 ENCOUNTER — Ambulatory Visit (HOSPITAL_COMMUNITY): Payer: PPO

## 2023-06-24 DIAGNOSIS — M6281 Muscle weakness (generalized): Secondary | ICD-10-CM

## 2023-06-24 DIAGNOSIS — R293 Abnormal posture: Secondary | ICD-10-CM | POA: Diagnosis not present

## 2023-06-24 DIAGNOSIS — M542 Cervicalgia: Secondary | ICD-10-CM

## 2023-06-24 NOTE — Therapy (Signed)
OUTPATIENT PHYSICAL THERAPY TREATMENT   Patient Name: Christopher [name] MRN: [id number] DOB:20-Nov-1955, 67 y.o., male Today's Date: 06/24/2023  END OF SESSION:  PT End of Session - 06/24/23 1406     Visit Number 4    Number of Visits 16    Date for PT Re-Evaluation 07/17/23    Authorization Type [employer]    Progress Note Due on Visit 10    PT Start Time 1347    PT Stop Time 1417    PT Time Calculation (min) 30 min    Activity Tolerance Patient tolerated treatment well;No increased pain    Behavior During Therapy WFL for tasks assessed/performed              Past Medical History:  Diagnosis Date   Anxiety    Arthritis    Asthma    BPH (benign prostatic hyperplasia)    Complication of anesthesia    pt had a hard time being able to move after spinal anesthesia , 3-4 hours   Depression    GERD (gastroesophageal reflux disease)    Gout    no meds   Headache(784.0)    otc meds prn   Heart murmur    dx as a child, no problems as an adult   History of hiatal hernia    Hyperlipidemia    IBS (irritable bowel syndrome)    PONV (postoperative nausea and vomiting)    Pre-diabetes    Borderline, diet and exercise, no med   Sleep apnea    uses CIPAP machine at night   Wears partial dentures    bottom partial   Past Surgical History:  Procedure Laterality Date   ANTERIOR CERVICAL DECOMP/DISCECTOMY FUSION N/A 10/13/2022   Procedure: ANTERIOR CERVICAL DISECTOMY FUSION - CERVICAL THREE-CERVICAL FOUR - CERVICAL FOUR-CERVICAL FIVE REMOVAL OF HARDWARE CERVICAL FIVE-CERVICAL SIX;  Surgeon: Donalee Citrin, MD;  Location: [facility];  Service: Neurosurgery;  Laterality: N/A;   APPENDECTOMY     BACK SURGERY  2002   neck and back fusion   BIOPSY  12/27/2015   Procedure: BIOPSY;  Surgeon: Malissa Hippo, MD;  Location: [facility];  Service: Endoscopy;;  Fundus biopsies and duodenal biopsies   BIOPSY  02/10/2020   Procedure: BIOPSY;  Surgeon: Malissa Hippo, MD;  [facility]:  [facility];  Service: Endoscopy;;  antral   CARDIAC CATHETERIZATION     CARDIAC CATHETERIZATION N/A 09/04/2016   Procedure: Right/Left Heart Cath and Coronary Angiography;  Surgeon: Peter M [place]az[place]and, MD;  Location: [facility] INVASIVE CV LAB;  Service: Cardiovascular;  Laterality: N/A;   CHOLECYSTECTOMY     CHONDROPLASTY  08/15/2011   Procedure: CHONDROPLASTY;  Surgeon: Fuller Canada, MD;  Location: [facility];  Service: Orthopedics;  Laterality: Left;   COLONOSCOPY  06/27/2011   Procedure: COLONOSCOPY;  Surgeon: Malissa Hippo, MD;  [facility]: [facility];  Service: Endoscopy;  Laterality: N/A;  9:00 / Pt to be here at 9am for 10:45 procedure, benign polyps removed   COLONOSCOPY N/A 10/05/2014   Procedure: COLONOSCOPY;  Surgeon: Malissa Hippo, MD;  [facility]: [facility];  Service: Endoscopy;  Laterality: N/A;  930   COLONOSCOPY N/A 02/11/2018   Procedure: COLONOSCOPY;  Surgeon: Malissa Hippo, MD;  [facility];  Service: Endoscopy;  Laterality: N/A;  830   ESOPHAGOGASTRODUODENOSCOPY N/A 12/27/2015   Procedure: ESOPHAGOGASTRODUODENOSCOPY (EGD);  Surgeon: Malissa Hippo, MD;  [facility];  Service: Endoscopy;  Laterality: N/A;  3:00  ESOPHAGOGASTRODUODENOSCOPY (EGD) WITH PROPOFOL N/A 02/10/2020   Procedure: ESOPHAGOGASTRODUODENOSCOPY (EGD) WITH PROPOFOL;  Surgeon: Malissa Hippo, MD;  [facility];  Service: Endoscopy;  Laterality: N/A;  155   HERNIA REPAIR  1998   umbilical hernia   JOINT REPLACEMENT  2023   knee and shoulder   KNEE ARTHROSCOPY     left knee   KNEE ARTHROSCOPY     right knee    LUMBAR LAMINECTOMY/DECOMPRESSION MICRODISCECTOMY  09/14/2012   Procedure: LUMBAR LAMINECTOMY/DECOMPRESSION MICRODISCECTOMY 1 LEVEL;  Surgeon: Karn Cassis, MD;  Location: [facility];  Service: Neurosurgery;  Laterality: Right;  Right Lumbar three-four Diskectomy   neck fusion  2005   NECK SURGERY     PATELLA-FEMORAL ARTHROPLASTY Right 02/18/2022    Procedure: RIGHT KNEE PATELLA REPLACEMENT, CEMENTED;  Surgeon: Cammy Copa, MD;  Location: [facility];  Service: Orthopedics;  Laterality: Right;   POLYPECTOMY  02/11/2018   Procedure: POLYPECTOMY;  Surgeon: Malissa Hippo, MD;  [facility];  Service: Endoscopy;;  colon   REVISION TOTAL SHOULDER TO REVERSE TOTAL SHOULDER Left 12/16/2018   Procedure: REVISION TOTAL SHOULDER TO REVERSE TOTAL SHOULDER;  Surgeon: Cammy Copa, MD;  Location: [facility];  Service: Orthopedics;  Laterality: Left;   SHOULDER ARTHROSCOPY WITH BICEPSTENOTOMY Left 09/23/2013   Procedure: SHOULDER ARTHROSCOPY WITH BICEPSTENOTOMY AND EXTENSIVE DEBRIDEMENT;  Surgeon: Vickki Hearing, MD;  Location: [facility];  Service: Orthopedics;  Laterality: Left;   SHOULDER ARTHROSCOPY WITH ROTATOR CUFF REPAIR Left 05/05/2014   Procedure: SHOULDER ARTHROSCOPY LIMITED DEBRIDEMENT;  Surgeon: Vickki Hearing, MD;  Location: [facility];  Service: Orthopedics;  Laterality: Left;   SHOULDER OPEN ROTATOR CUFF REPAIR Left 05/05/2014   Procedure: ROTATOR CUFF REPAIR SHOULDER OPEN;  Surgeon: Vickki Hearing, MD;  Location: [facility];  Service: Orthopedics;  Laterality: Left;   SHOULDER OPEN ROTATOR CUFF REPAIR Left 12/16/2018   Procedure: LEFT SHOULDER POSSIBLE SUBSCAPULARIS REPAIR VS. REVISION TO REVERSE TOTAL SHOULDER REPLACEMENT;  Surgeon: Cammy Copa, MD;  Location: [facility];  Service: Orthopedics;  Laterality: Left;   SHOULDER SURGERY Right    Open Mumford procedure   SPINAL FUSION     x2, 2003 and 2007   TOTAL KNEE ARTHROPLASTY Left 06/16/2017   Procedure: LEFT TOTAL KNEE ARTHROPLASTY;  Surgeon: Vickki Hearing, MD;  Location: [facility];  Service: Orthopedics;  Laterality: Left;   TOTAL KNEE ARTHROPLASTY Right 05/14/2021   Procedure: TOTAL KNEE ARTHROPLASTY;  Surgeon: Vickki Hearing, MD;  Location: [facility];  Service: Orthopedics;  Laterality: Right;   TOTAL SHOULDER ARTHROPLASTY Left 08/19/2018   Procedure: left  shoulder replacement;  Surgeon: Cammy Copa, MD;  Location: [facility] OR;  Service: Orthopedics;  Laterality: Left;   Patient Active Problem List   Diagnosis Date Noted   Abdominal pain 06/12/2023   Dysuria 06/12/2023   Prediabetes 01/05/2023   History of anaphylaxis 11/13/2022   Idiopathic urticaria 11/03/2022   Angioedema 10/24/2022   Hives 10/24/2022   Spinal stenosis in cervical region 10/13/2022   DOE (dyspnea on exertion) 09/26/2022   Abnormal EKG 09/18/2022   Hypogonadism in male 07/07/2022   Preop cardiovascular exam 07/07/2022   Depression, recurrent (HCC) 06/06/2022   Chronic musculoskeletal pain 06/06/2022   Fatigue 06/06/2022   Current tobacco use 06/06/2022   Encounter to establish care 06/06/2022   S/P right knee surgery 02/18/2022   S/P TKR (total knee replacement), right 05/14/21 07/02/2021   Status post total right knee replacement 05/14/2021  Primary osteoarthritis of right knee    Renal cyst 10/03/2020   Frequency of micturition 10/03/2020   Benign prostatic hyperplasia with urinary obstruction 10/03/2020   IBS (irritable bowel syndrome) 07/05/2020   Lumbar disc herniation 06/29/2019   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HCC)    Primary osteoarthritis, left shoulder    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   History of colonic polyps 11/26/2017   S/P total knee replacement, left 06/16/17 06/16/2017   Primary osteoarthritis of left knee    HLD (hyperlipidemia) 09/04/2016   Angina pectoris (HCC) 09/04/2016   OSA (obstructive sleep apnea) 09/04/2016   COPD (chronic obstructive pulmonary disease) (HCC) 09/04/2016   Rotator cuff tear 05/05/2014   S/P shoulder surgery 09/26/2013   Arthritis, shoulder region 09/26/2013   Bursitis, shoulder 09/26/2013   Synovitis of shoulder 09/26/2013   Labral tear of shoulder, degenerative 09/26/2013   Biceps tendon tear 09/26/2013   Rotator cuff syndrome of left shoulder 06/21/2013   Arthritis 06/21/2013   Patellofemoral  arthritis of right knee 03/29/2013   Effusion of knee joint 03/29/2013   Bursitis/tendonitis, shoulder 03/10/2013   Effusion of knee joint, left 08/18/2011   Knee pain 08/18/2011   Acute torn meniscus 07/30/2011   Old torn meniscus of knee 07/30/2011   Arthropathy, lower leg 10/15/2010   MEDIAL MENISCUS TEAR, RIGHT 10/15/2010   HIP PAIN 01/15/2010   DEGENERATIVE DISC DISEASE, LUMBOSACRAL SPINE W/RADICULOPATHY 01/15/2010   PLICA SYNDROME 08/09/2009   DERANGEMENT MENISCUS 07/09/2009   JOINT EFFUSION, LEFT KNEE 07/09/2009   Unilateral primary osteoarthritis, left knee 01/30/2009   KNEE PAIN 01/30/2009   Sprain of ankle 11/08/2007    PCP: Billie Lade, MD  REFERRING PROVIDER: Sherryl Manges, NP  REFERRING DIAG: M54.2 (ICD-10-CM) - Cervicalgia  THERAPY DIAG:  Abnormal posture  Cervicalgia  Muscle weakness (generalized)  Rationale for Evaluation and Treatment: Rehabilitation  ONSET DATE: 10/13/22  SUBJECTIVE:                                                                                                                                                                                                         SUBJECTIVE STATEMENT: Reports a lot of pain since last session, lots of focal soreness following dry needling in those areas, then increased HA adn extensor pain the day following. Used ice for antalgic benefit for first time in ages. Has been getting leaves up today, also still working on water heater replacement with son.  From eval: Hand dominance: Right  PERTINENT HISTORY:  Carlosdaniel Overdorf is a 67yoM referred to [facility]PPT after ongoing pain,  discomfort. Pt states he had a fusion in his neck in February 2024 and didn't 'think it's healing.' Pt reports his headaches are also persistent. It gives him a fit every day. Pt is to get a bone stimulator for his neck. Gets a lot of pulling in his neck. States his head is crooked. Pt states he uses a riding lawnmower. Has not  been doing any stretches. Pt states N/T into his shouldersanterior cervical discectomy fusion C3/4, C4/5 and removal of hardware at C5/6 on 10/13/22. PMH: bilat TKA, bilat TSA.  PAIN:  Are you having pain? Yes  7/10 neck pain, HA (bilat cervical extensors C3-6)  PRECAUTIONS: None  WEIGHT BEARING RESTRICTIONS: No  FALLS:  Has patient fallen in last 6 months? No  PATIENT GOALS: Decrease muscle pain/headaches, improve ROM, cervical fusion to heal  NEXT MD VISIT: n/a  OBJECTIVE:    TODAY'S TREATMENT:                                                                                                                              DATE:   06/17/23 -manual sustained squeeze release to cervical extensors 2x90sec (palpable decrease of tension -cervical retraction into 2 pillows 15x -Deep neck flexion lift off 4x8 (repeated cues to avoid excessive height)  -BUE repeated overhead flexion x15 -isometric shoulder extension into plinth 2x15 -heat application to posterior neck muscles x5 minutes  *pain down to 6/10 in neck at end of session, HA resolved.      PATIENT EDUCATION: will await response from today, then modify HEP as appropriate next session.  Person educated: Patient Education method: Explanation, Demonstration, and Handouts Education comprehension: verbalized understanding, returned demonstration, and needs further education  HOME EXERCISE PROGRAM: Access Code: [email or web address] URL: [email or web address] Date: 06/05/2023 Prepared by: Vernon Prey April Kirstie Peri  Exercises - Hooklying Suboccipital Release with Fingers  - 1 x daily - 7 x weekly - 2 sets - 30 sec hold - Seated Cervical Flexion Stretch with Finger Support Behind Neck  - 1 x daily - 7 x weekly - 2 sets - 30 sec hold - Seated Levator Scapulae Stretch  - 1 x daily - 7 x weekly - 2 sets - 30 sec hold - Seated Upper Trapezius Stretch  - 1 x daily - 7 x weekly - 2 sets - 30 sec hold - Standing Upper Trapezius  Mobilization with Small Ball  - 1 x daily - 7 x weekly - 2 sets - 30 sec hold - Seated Cervical Retraction  - 1 x daily - 7 x weekly - 2 sets - 10 reps - Prone Scapular Slide with Shoulder Extension  - 1 x daily - 7 x weekly - 2 sets - 10 reps - Prone Scapular Retraction Arms at Side  - 1 x daily - 7 x weekly - 2 sets - 10 reps - Prone Scapular Retraction Y  - 1 x daily - 7 x weekly - 2 sets - 10  reps DC the 3 above due to prone intolerance    ASSESSMENT:  CLINICAL IMPRESSION: Author concerned with difficulty in predictable session response on return today. Discussion regarding his IADL performance, all fairly intense, quite variable, questionable pacing, not clear that he takes breaks or listens to his body during activity We have excellent reduction in spasm early in session with hands-on technique, no need for needling at present. Did identify how incredibly load intolerant deep neck flexors are today, clearly would limited heavy household activities du eot limited ability to perform self bracing in neck. Good pain resolution of HA and decrease in neck, but session kept short as to decrease complexity of treatment measures and better determine appropriateness of interventions. Urged pt to better moderate his exertion activity in the interim. PT will help address deficits and achieve LT goals of care.   OBJECTIVE IMPAIRMENTS: decreased activity tolerance, decreased balance, decreased endurance, decreased mobility, decreased ROM, decreased strength, hypomobility, increased fascial restrictions, increased muscle spasms, impaired flexibility, impaired sensation, impaired UE functional use, improper body mechanics, postural dysfunction, and pain.   ACTIVITY LIMITATIONS: carrying, lifting, bending, sitting, standing, sleeping, toileting, dressing, reach over head, and hygiene/grooming  PARTICIPATION LIMITATIONS: meal prep, cleaning, laundry, driving, community activity, and yard work  PERSONAL  FACTORS: Age, Fitness, Past/current experiences, and Time since onset of injury/illness/exacerbation are also affecting patient's functional outcome.   REHAB POTENTIAL: Good  CLINICAL DECISION MAKING: Evolving/moderate complexity  EVALUATION COMPLEXITY: Moderate   GOALS: Goals reviewed with patient? Yes  SHORT TERM GOALS: Target date: 06/19/2023   Pt will be ind with initial HEP Baseline:  Goal status: INITIAL  2.  Pt will have improved cervical ROM in all directions by at least 10 deg for driving tasks Baseline:  Goal status: INITIAL  3.  Pt will report >/=25% improvement in headaches/muscle tension Baseline:  Goal status: INITIAL   LONG TERM GOALS: Target date: 07/17/2023   Pt will be ind with progression and management of HEP Baseline:  Goal status: INITIAL  2.  Pt will demo at least 4/5 mid/low trap strength for improved postural stability  Baseline:  Goal status: INITIAL  3.  Pt will be able to maintain neutral neck position x15 min for home chores Baseline:  Goal status: INITIAL  4.  Pt will report >/=50% improvement in headaches/muscle tension Baseline:  Goal status: INITIAL  5.  Pt will have FOTO score >/=49 Baseline:  Goal status: INITIAL  PLAN:  PT FREQUENCY: 1-2x/week  PT DURATION: 8 weeks  PLANNED INTERVENTIONS: Therapeutic exercises, Therapeutic activity, Neuromuscular re-education, Balance training, Gait training, Patient/Family education, Self Care, Joint mobilization, Dry Needling, Electrical stimulation, Spinal mobilization, Cryotherapy, Moist heat, Taping, Manual therapy, and Re-evaluation  PLAN FOR NEXT SESSION: repeat relaese to cervical extensors, scapular strengthening   Gracin Soohoo C, PT 06/24/2023, 2:14 PM  Rosamaria Lints, PT Physical Therapist [facility] [place]  [phone]

## 2023-06-29 ENCOUNTER — Telehealth (HOSPITAL_COMMUNITY): Payer: Self-pay

## 2023-06-29 ENCOUNTER — Encounter (HOSPITAL_COMMUNITY): Payer: PPO

## 2023-06-29 NOTE — Telephone Encounter (Signed)
Chartered loss adjuster received a message from support staff to contact pt's wife today, she recently called to cancel husbands appointment. Author attempted to reach both at 1420, no answer, no option to leave VM.   Rosamaria Lints, PT Physical Therapist [facility] [place]  [phone]

## 2023-07-02 ENCOUNTER — Other Ambulatory Visit: Payer: Self-pay

## 2023-07-03 ENCOUNTER — Other Ambulatory Visit: Payer: Self-pay | Admitting: Internal Medicine

## 2023-07-03 ENCOUNTER — Other Ambulatory Visit (HOSPITAL_COMMUNITY): Payer: Self-pay | Admitting: Student

## 2023-07-03 DIAGNOSIS — S129XXD Fracture of neck, unspecified, subsequent encounter: Secondary | ICD-10-CM

## 2023-07-03 DIAGNOSIS — E782 Mixed hyperlipidemia: Secondary | ICD-10-CM

## 2023-07-07 ENCOUNTER — Other Ambulatory Visit: Payer: Self-pay | Admitting: Internal Medicine

## 2023-07-08 ENCOUNTER — Ambulatory Visit: Payer: PPO | Admitting: Internal Medicine

## 2023-07-08 ENCOUNTER — Encounter: Payer: Self-pay | Admitting: Internal Medicine

## 2023-07-08 VITALS — BP 131/78 | HR 95 | Ht 66.0 in | Wt 222.0 lb

## 2023-07-08 DIAGNOSIS — R7303 Prediabetes: Secondary | ICD-10-CM

## 2023-07-08 DIAGNOSIS — E1165 Type 2 diabetes mellitus with hyperglycemia: Secondary | ICD-10-CM | POA: Insufficient documentation

## 2023-07-08 DIAGNOSIS — Z7984 Long term (current) use of oral hypoglycemic drugs: Secondary | ICD-10-CM

## 2023-07-08 DIAGNOSIS — R3589 Other polyuria: Secondary | ICD-10-CM | POA: Diagnosis not present

## 2023-07-08 LAB — GLUCOSE, POCT (MANUAL RESULT ENTRY): POC Glucose: HIGH mg/dL (ref 70–99)

## 2023-07-08 MED ORDER — LANCETS MISC. MISC
1.0000 | Freq: Three times a day (TID) | 0 refills | Status: AC
Start: 2023-07-08 — End: 2023-08-07

## 2023-07-08 MED ORDER — SERTRALINE HCL 50 MG PO TABS: 50.0000 mg | ORAL_TABLET | Freq: Every morning | ORAL | 2 refills | Status: DC

## 2023-07-08 MED ORDER — BLOOD GLUCOSE MONITORING SUPPL DEVI
1.0000 | Freq: Three times a day (TID) | 0 refills | Status: AC
Start: 2023-07-08 — End: ?

## 2023-07-08 MED ORDER — METFORMIN HCL ER 500 MG PO TB24
1000.0000 mg | ORAL_TABLET | Freq: Two times a day (BID) | ORAL | 1 refills | Status: DC
Start: 2023-07-08 — End: 2024-04-05

## 2023-07-08 MED ORDER — LANCET DEVICE MISC
1.0000 | Freq: Three times a day (TID) | 0 refills | Status: AC
Start: 2023-07-08 — End: 2023-08-07

## 2023-07-08 MED ORDER — BLOOD GLUCOSE TEST VI STRP
1.0000 | ORAL_STRIP | Freq: Three times a day (TID) | 0 refills | Status: DC
Start: 2023-07-08 — End: 2023-08-17

## 2023-07-08 MED ORDER — OZEMPIC (0.25 OR 0.5 MG/DOSE) 2 MG/3ML ~~LOC~~ SOPN
0.2500 mg | PEN_INJECTOR | SUBCUTANEOUS | 0 refills | Status: DC
Start: 2023-07-08 — End: 2023-08-06

## 2023-07-08 MED ORDER — LANTUS SOLOSTAR 100 UNIT/ML ~~LOC~~ SOPN
10.0000 [IU] | PEN_INJECTOR | Freq: Every day | SUBCUTANEOUS | 2 refills | Status: DC
Start: 2023-07-08 — End: 2023-09-03

## 2023-07-08 NOTE — Patient Instructions (Signed)
It was a pleasure to see you today.  Thank you for giving [place] the opportunity to be involved in your care.  Below is a brief recap of your visit and next steps.  We will plan to see you again in 2 weeks.  Summary Increase metformin to 1000 mg twice daily Start lantus 10 units nightly. OK to increase by 4 units if blood sugar readings remain > 200 Start Ozempic Check labs Zoloft refilled Follow up in 2 weeks

## 2023-07-08 NOTE — Assessment & Plan Note (Signed)
Return to care today for follow-up endorsing a several week history of polyuria, polydipsia, polyphagia, and significantly elevated blood sugar readings.  POC BGL today is undetectably high.  He was recently started on metformin 500 mg daily and his wife increased the frequency to twice daily after blood sugar readings remained persistently elevated.  History of prediabetes with A1c 6.4 in May, now meeting criteria for diabetes mellitus. -Increase metformin to 1000 mg twice daily -Add Lantus 10 units nightly.  Patient was instructed to increase in increments of 4 units for BGL readings > 200 -Start Ozempic 0.25 mg weekly -Repeat today -We will tentatively plan for follow-up in 2 weeks for reassessment.  In the interim, I requested that the patient keep a blood sugar log for review.

## 2023-07-08 NOTE — Progress Notes (Signed)
Established Patient Office Visit  Subjective   Patient ID: Christopher [name], male    DOB: 12-01-1955  Age: 67 y.o. MRN: [id number]  Chief Complaint  Patient presents with   Diabetes    Patient states his sugar has been running between 500-600   Dizziness    Dizzy spells    Urinary Retention    Difficulty urination    Christopher [name] return to care today for routine follow-up.  He was last evaluated by me on 5/6 no medication changes were made at that time and 51-month follow-up was arranged.  In the interim, he has been evaluated by allergy and immunology, pulmonology, urology, orthopedic surgery, and neurosurgery.  He presented to [facility] for an acute visit on 10/14 endorsing dysuria and generalized abdominal pain.  He also reported home glucose readings ranging 200-300 in addition to symptoms of polyuria, polydipsia, and polyphagia.  Metformin 500 mg daily was started.  Today Christopher [name] reports significantly elevated blood sugar readings at home.  He states that numbers have reached 600.  He continues to endorse polyuria, polydipsia, and polyphagia.  He experiences fatigue and occasional dizziness as well.  He is drinking a gallon of milk per night.  POC blood glucose reading in office today is undetectably high.  Past Medical History:  Diagnosis Date   Anxiety    Arthritis    Asthma    BPH (benign prostatic hyperplasia)    Complication of anesthesia    pt had a hard time being able to move after spinal anesthesia , 3-4 hours   Depression    GERD (gastroesophageal reflux disease)    Gout    no meds   Headache(784.0)    otc meds prn   Heart murmur    dx as a child, no problems as an adult   History of hiatal hernia    Hyperlipidemia    IBS (irritable bowel syndrome)    PONV (postoperative nausea and vomiting)    Pre-diabetes    Borderline, diet and exercise, no med   Sleep apnea    uses CIPAP machine at night   Wears partial dentures    bottom partial   Past Surgical History:   Procedure Laterality Date   ANTERIOR CERVICAL DECOMP/DISCECTOMY FUSION N/A 10/13/2022   Procedure: ANTERIOR CERVICAL DISECTOMY FUSION - CERVICAL THREE-CERVICAL FOUR - CERVICAL FOUR-CERVICAL FIVE REMOVAL OF HARDWARE CERVICAL FIVE-CERVICAL SIX;  Surgeon: Donalee Citrin, MD;  Location: [facility] OR;  Service: Neurosurgery;  Laterality: N/A;   APPENDECTOMY     BACK SURGERY  2002   neck and back fusion   BIOPSY  12/27/2015   Procedure: BIOPSY;  Surgeon: Malissa Hippo, MD;  Location: [facility];  Service: Endoscopy;;  Fundus biopsies and duodenal biopsies   BIOPSY  02/10/2020   Procedure: BIOPSY;  Surgeon: Malissa Hippo, MD;  [facility]: [facility];  Service: Endoscopy;;  antral   CARDIAC CATHETERIZATION     CARDIAC CATHETERIZATION N/A 09/04/2016   Procedure: Right/Left Heart Cath and Coronary Angiography;  Surgeon: Peter M Swaziland, MD;  Location: [facility]VAS[facility];  Service: Cardiovascular;  Laterality: N/A;   CHOLECYSTECTOMY     CHONDROPLASTY  08/15/2011   Procedure: CHONDROPLASTY;  Surgeon: Fuller Canada, MD;  Location: [facility] ORS;  Service: Orthopedics;  Laterality: Left;   COLONOSCOPY  06/27/2011   Procedure: COLONOSCOPY;  Surgeon: Malissa Hippo, MD;  [facility]: [facility];  Service: Endoscopy;  Laterality: N/A;  9:00 / Pt to be here at 9am  for 10:45 procedure, benign polyps removed   COLONOSCOPY N/A 10/05/2014   Procedure: COLONOSCOPY;  Surgeon: Malissa Hippo, MD;  [facility];  Service: Endoscopy;  Laterality: N/A;  930   COLONOSCOPY N/A 02/11/2018   Procedure: COLONOSCOPY;  Surgeon: Malissa Hippo, MD;  [facility];  Service: Endoscopy;  Laterality: N/A;  830   ESOPHAGOGASTRODUODENOSCOPY N/A 12/27/2015   Procedure: ESOPHAGOGASTRODUODENOSCOPY (EGD);  Surgeon: Malissa Hippo, MD;  [facility];  Service: Endoscopy;  Laterality: N/A;  3:00   ESOPHAGOGASTRODUODENOSCOPY (EGD) WITH PROPOFOL N/A 02/10/2020   Procedure: ESOPHAGOGASTRODUODENOSCOPY (EGD)  WITH PROPOFOL;  Surgeon: Malissa Hippo, MD;  [facility];  Service: Endoscopy;  Laterality: N/A;  155   HERNIA REPAIR  1998   umbilical hernia   JOINT REPLACEMENT  2023   knee and shoulder   KNEE ARTHROSCOPY     left knee   KNEE ARTHROSCOPY     right knee    LUMBAR LAMINECTOMY/DECOMPRESSION MICRODISCECTOMY  09/14/2012   Procedure: LUMBAR LAMINECTOMY/DECOMPRESSION MICRODISCECTOMY 1 LEVEL;  Surgeon: Karn Cassis, MD;  Location: [facility];  Service: Neurosurgery;  Laterality: Right;  Right Lumbar three-four Diskectomy   neck fusion  2005   NECK SURGERY     PATELLA-FEMORAL ARTHROPLASTY Right 02/18/2022   Procedure: RIGHT KNEE PATELLA REPLACEMENT, CEMENTED;  Surgeon: Cammy Copa, MD;  Location: [facility];  Service: Orthopedics;  Laterality: Right;   POLYPECTOMY  02/11/2018   Procedure: POLYPECTOMY;  Surgeon: Malissa Hippo, MD;  [facility];  Service: Endoscopy;;  colon   REVISION TOTAL SHOULDER TO REVERSE TOTAL SHOULDER Left 12/16/2018   Procedure: REVISION TOTAL SHOULDER TO REVERSE TOTAL SHOULDER;  Surgeon: Cammy Copa, MD;  [facility]: [facility];  Service: Orthopedics;  Laterality: Left;   SHOULDER ARTHROSCOPY WITH BICEPSTENOTOMY Left 09/23/2013   Procedure: SHOULDER ARTHROSCOPY WITH BICEPSTENOTOMY AND EXTENSIVE DEBRIDEMENT;  Surgeon: Vickki Hearing, MD;  Location: [facility];  Service: Orthopedics;  Laterality: Left;   SHOULDER ARTHROSCOPY WITH ROTATOR CUFF REPAIR Left 05/05/2014   Procedure: SHOULDER ARTHROSCOPY LIMITED DEBRIDEMENT;  Surgeon: Vickki Hearing, MD;  Location: [facility];  Service: Orthopedics;  Laterality: Left;   SHOULDER OPEN ROTATOR CUFF REPAIR Left 05/05/2014   Procedure: ROTATOR CUFF REPAIR SHOULDER OPEN;  Surgeon: Vickki Hearing, MD;  Location: [facility];  Service: Orthopedics;  Laterality: Left;   SHOULDER OPEN ROTATOR CUFF REPAIR Left 12/16/2018   Procedure: LEFT SHOULDER POSSIBLE SUBSCAPULARIS REPAIR VS. REVISION TO REVERSE  TOTAL SHOULDER REPLACEMENT;  Surgeon: Cammy Copa, MD;  Location: [facility];  Service: Orthopedics;  Laterality: Left;   SHOULDER SURGERY Right    Open Mumford procedure   SPINAL FUSION     x2, 2003 and 2007   TOTAL KNEE ARTHROPLASTY Left 06/16/2017   Procedure: LEFT TOTAL KNEE ARTHROPLASTY;  Surgeon: Vickki Hearing, MD;  Location: [facility];  Service: Orthopedics;  Laterality: Left;   TOTAL KNEE ARTHROPLASTY Right 05/14/2021   Procedure: TOTAL KNEE ARTHROPLASTY;  Surgeon: Vickki Hearing, MD;  Location: [facility];  Service: Orthopedics;  Laterality: Right;   TOTAL SHOULDER ARTHROPLASTY Left 08/19/2018   Procedure: left shoulder replacement;  Surgeon: Cammy Copa, MD;  Location: [facility] OR;  Service: Orthopedics;  Laterality: Left;   Social History   Tobacco Use   Smoking status: Former    Current packs/day: 0.00    Average packs/day: 1 pack/day for 44.0 years (44.0 ttl pk-yrs)    Types: Cigarettes  Start date: 08/25/1978    Quit date: 08/25/2022    Years since quitting: 0.8    Passive exposure: Current   Smokeless tobacco: Never   Tobacco comments:    smokes a pack a day. since age 79  Vaping Use   Vaping status: Never Used  Substance Use Topics   Alcohol use: Never   Drug use: Never   Family History  Problem Relation Age of Onset   Diabetes Mother    Alzheimer's disease Father    Diabetes Father    Diabetes Other    Lung disease Other    Arthritis Other    Anesthesia problems Neg Hx    Hypotension Neg Hx    Malignant hyperthermia Neg Hx    Pseudochol deficiency Neg Hx    Sleep apnea Neg Hx    Allergies  Allergen Reactions   Celebrex [Celecoxib] Itching and Swelling    All over   Codeine Nausea And Vomiting and Other (See Comments)    Extreme stomach pain. This includes anything with the derivative of codeine in it. (Does tolerate hydrocodone)   Cortisone Swelling    SWELLING REACTION UNSPECIFIED    Doxycycline Swelling and Other (See Comments)     Made tongue turn black    Medrol [Methylprednisolone] Hives and Swelling    Angioedema    Prednisone Swelling    SWELLING REACTION UNSPECIFIED    Relafen [Nabumetone] Swelling    SWELLING REACTION UNSPECIFIED    Aspirin Other (See Comments)    Stomach cramps   Oxycodone-Acetaminophen Itching and Other (See Comments)    Can tolerate with benadryl    Raloxifene Rash   Rofecoxib Rash   Review of Systems  Constitutional:  Positive for malaise/fatigue.  Genitourinary:        Polyuria  Neurological:  Positive for dizziness.  Endo/Heme/Allergies:  Positive for polydipsia.     Objective:     BP 131/78 (BP Location: Left Arm, Patient Position: Sitting, Cuff Size: Large)   Pulse 95   Ht 5\' 6"  (1.676 m)   Wt 222 lb (100.7 kg)   SpO2 93%   BMI 35.83 kg/m  BP Readings from Last 3 Encounters:  07/08/23 131/78  06/12/23 126/82  06/01/23 (!) 152/79   Physical Exam Vitals reviewed.  Constitutional:      General: He is not in acute distress.    Appearance: Normal appearance. He is obese. He is not ill-appearing.  HENT:     Head: Normocephalic and atraumatic.     Right Ear: External ear normal.     Left Ear: External ear normal.     Nose: Nose normal. No congestion or rhinorrhea.     Mouth/Throat:     Mouth: Mucous membranes are moist.     Pharynx: Oropharynx is clear.  Eyes:     General: No scleral icterus.    Extraocular Movements: Extraocular movements intact.     Conjunctiva/sclera: Conjunctivae normal.     Pupils: Pupils are equal, round, and reactive to light.  Cardiovascular:     Rate and Rhythm: Normal rate and regular rhythm.     Pulses: Normal pulses.     Heart sounds: Normal heart sounds. No murmur heard. Pulmonary:     Effort: Pulmonary effort is normal.     Breath sounds: Normal breath sounds. No wheezing, rhonchi or rales.  Abdominal:     General: Abdomen is flat. Bowel sounds are normal. There is no distension.     Palpations: Abdomen is soft.  Tenderness: There is no abdominal tenderness.  Musculoskeletal:        General: No swelling or deformity. Normal range of motion.     Cervical back: Normal range of motion.  Skin:    General: Skin is warm and dry.     Capillary Refill: Capillary refill takes less than 2 seconds.  Neurological:     General: No focal deficit present.     Mental Status: He is alert and oriented to person, place, and time.     Motor: No weakness.  Psychiatric:        Mood and Affect: Mood normal.        Behavior: Behavior normal.        Thought Content: Thought content normal.   Last CBC Lab Results  Component Value Date   WBC 7.8 05/21/2023   HGB 14.7 05/21/2023   HCT 44.4 05/21/2023   MCV 84 05/21/2023   MCH 27.8 05/21/2023   RDW 15.2 05/21/2023   PLT 211 05/21/2023   Last metabolic panel Lab Results  Component Value Date   GLUCOSE 362 (H) 05/21/2023   NA 136 05/21/2023   K 4.7 05/21/2023   CL 95 (L) 05/21/2023   CO2 19 (L) 05/21/2023   BUN 21 05/21/2023   CREATININE 0.88 05/21/2023   EGFR 95 05/21/2023   CALCIUM 9.6 05/21/2023   PHOS 3.6 10/26/2022   PROT 6.5 05/21/2023   ALBUMIN 4.4 05/21/2023   LABGLOB 2.1 05/21/2023   AGRATIO 2.1 02/11/2023   BILITOT 0.4 05/21/2023   ALKPHOS 110 05/21/2023   AST 11 05/21/2023   ALT 21 05/21/2023   ANIONGAP 9 10/26/2022   Last lipids Lab Results  Component Value Date   CHOL 148 01/05/2023   HDL 37 (L) 01/05/2023   LDLCALC 72 01/05/2023   TRIG 236 (H) 01/05/2023   CHOLHDL 4.0 01/05/2023   Last hemoglobin A1c Lab Results  Component Value Date   HGBA1C 6.4 (H) 01/05/2023   Last thyroid functions Lab Results  Component Value Date   TSH 1.530 06/06/2022   Last vitamin B12 and Folate Lab Results  Component Value Date   VITAMINB12 632 06/06/2022   FOLATE 3.5 06/06/2022   The 10-year ASCVD risk score (Arnett DK, et al., 2019) is: 35.4%    Assessment & Plan:   Problem List Items Addressed This Visit       Type 2 diabetes  mellitus with hyperglycemia (HCC)    Return to care today for follow-up endorsing a several week history of polyuria, polydipsia, polyphagia, and significantly elevated blood sugar readings.  POC BGL today is undetectably high.  He was recently started on metformin 500 mg daily and his wife increased the frequency to twice daily after blood sugar readings remained persistently elevated.  History of prediabetes with A1c 6.4 in May, now meeting criteria for diabetes mellitus. -Increase metformin to 1000 mg twice daily -Add Lantus 10 units nightly.  Patient was instructed to increase in increments of 4 units for BGL readings > 200 -Start Ozempic 0.25 mg weekly -Repeat today -We will tentatively plan for follow-up in 2 weeks for reassessment.  In the interim, I requested that the patient keep a blood sugar log for review.      Return in about 2 weeks (around 07/22/2023) for diabetes.   Billie Lade, MD

## 2023-07-09 ENCOUNTER — Telehealth: Payer: Self-pay | Admitting: Internal Medicine

## 2023-07-09 NOTE — Telephone Encounter (Signed)
Copied from CRM [id number]. Topic: Clinical - Lab/Test Results >> Jul 09, 2023  2:57 PM Christen Bame H wrote: Reason for CRM: Hulda Humphrey from [facility] called to give critical patient results: Blood Glucose 601 >> Jul 09, 2023  3:19 PM Areatha Keas wrote: Received a call from [employer], [employer] about a critical lab result of a blood glucose of 601. Routed to Primary Care office. Fax to be sent from [employer], [place], as well. Fax Number to [facility] provided.

## 2023-07-09 NOTE — Telephone Encounter (Signed)
Patient called after call hours, received a after call fax for a critical lab return call [phone].

## 2023-07-10 LAB — CBC WITH DIFFERENTIAL/PLATELET
Basophils Absolute: 0.1 10*3/uL (ref 0.0–0.2)
Basos: 1 %
EOS (ABSOLUTE): 0 10*3/uL (ref 0.0–0.4)
Eos: 0 %
Hematocrit: 48.2 % (ref 37.5–51.0)
Hemoglobin: 15.8 g/dL (ref 13.0–17.7)
Immature Grans (Abs): 0 10*3/uL (ref 0.0–0.1)
Immature Granulocytes: 0 %
Lymphocytes Absolute: 2.3 10*3/uL (ref 0.7–3.1)
Lymphs: 24 %
MCH: 28.1 pg (ref 26.6–33.0)
MCHC: 32.8 g/dL (ref 31.5–35.7)
MCV: 86 fL (ref 79–97)
Monocytes Absolute: 0.6 10*3/uL (ref 0.1–0.9)
Monocytes: 6 %
Neutrophils Absolute: 6.6 10*3/uL (ref 1.4–7.0)
Neutrophils: 69 %
Platelets: 225 10*3/uL (ref 150–450)
RBC: 5.63 x10E6/uL (ref 4.14–5.80)
RDW: 14.2 % (ref 11.6–15.4)
WBC: 9.6 10*3/uL (ref 3.4–10.8)

## 2023-07-10 LAB — CMP14+EGFR
ALT: 28 [IU]/L (ref 0–44)
AST: 19 [IU]/L (ref 0–40)
Albumin: 4.6 g/dL (ref 3.9–4.9)
Alkaline Phosphatase: 161 [IU]/L — ABNORMAL HIGH (ref 44–121)
BUN/Creatinine Ratio: 28 — ABNORMAL HIGH (ref 10–24)
BUN: 30 mg/dL — ABNORMAL HIGH (ref 8–27)
Bilirubin Total: 0.4 mg/dL (ref 0.0–1.2)
CO2: 21 mmol/L (ref 20–29)
Calcium: 10.1 mg/dL (ref 8.6–10.2)
Chloride: 92 mmol/L — ABNORMAL LOW (ref 96–106)
Creatinine, Ser: 1.09 mg/dL (ref 0.76–1.27)
Globulin, Total: 2.5 g/dL (ref 1.5–4.5)
Glucose: 601 mg/dL (ref 70–99)
Potassium: 4.5 mmol/L (ref 3.5–5.2)
Sodium: 130 mmol/L — ABNORMAL LOW (ref 134–144)
Total Protein: 7.1 g/dL (ref 6.0–8.5)
eGFR: 75 mL/min/{1.73_m2} (ref >59)

## 2023-07-10 LAB — UA/M W/RFLX CULTURE, ROUTINE
Bilirubin, UA: NEGATIVE
Ketones, UA: NEGATIVE
Leukocytes,UA: NEGATIVE
Nitrite, UA: NEGATIVE
Protein,UA: NEGATIVE
RBC, UA: NEGATIVE
Specific Gravity, UA: >=1.03 — AB (ref 1.005–1.030)
Urobilinogen, Ur: 0.2 mg/dL (ref 0.2–1.0)
pH, UA: 6 (ref 5.0–7.5)

## 2023-07-10 LAB — MICROSCOPIC EXAMINATION
Bacteria, UA: NONE SEEN
Casts: NONE SEEN /[LPF]
Epithelial Cells (non renal): NONE SEEN /[HPF] (ref 0–10)
RBC, Urine: NONE SEEN /[HPF] (ref 0–2)
WBC, UA: NONE SEEN /[HPF] (ref 0–5)

## 2023-07-10 LAB — HEMOGLOBIN A1C
Est. average glucose Bld gHb Est-mCnc: 364 mg/dL
Hgb A1c MFr Bld: 14.3 % — ABNORMAL HIGH (ref 4.8–5.6)

## 2023-07-10 LAB — MICROALBUMIN / CREATININE URINE RATIO
Creatinine, Urine: 26.5 mg/dL
Microalb/Creat Ratio: <11 mg/g{creat} (ref 0–29)
Microalbumin, Urine: <3 ug/mL

## 2023-07-12 ENCOUNTER — Ambulatory Visit (HOSPITAL_COMMUNITY)
Admission: RE | Admit: 2023-07-12 | Discharge: 2023-07-12 | Disposition: A | Discharge status: Home / Self Care | Payer: PPO | Source: Ambulatory Visit | Attending: Student | Admitting: Student

## 2023-07-12 DIAGNOSIS — S129XXD Fracture of neck, unspecified, subsequent encounter: Secondary | ICD-10-CM | POA: Diagnosis present

## 2023-07-13 ENCOUNTER — Ambulatory Visit: Payer: PPO

## 2023-07-13 ENCOUNTER — Telehealth (INDEPENDENT_AMBULATORY_CARE_PROVIDER_SITE_OTHER): Payer: Self-pay | Admitting: *Deleted

## 2023-07-13 DIAGNOSIS — L501 Idiopathic urticaria: Secondary | ICD-10-CM | POA: Diagnosis not present

## 2023-07-13 NOTE — Telephone Encounter (Signed)
Room 1 , 2 day prep Thanks 

## 2023-07-13 NOTE — Telephone Encounter (Signed)
Who is your primary care physician: Dr. Durwin Nora  Reasons for the colonoscopy: 5 yr recall  Have you had a colonoscopy before?  yes  Do you have family history of colon cancer? no  Previous colonoscopy with polyps removed? yes  Do you have a history colorectal cancer?   no  Are you diabetic? If yes, Type 1 or Type 2?    no  Do you have a prosthetic or mechanical heart valve? no  Do you have a pacemaker/defibrillator?   no  Have you had endocarditis/atrial fibrillation? no  Have you had joint replacement within the last 12 months?  yes  Do you tend to be constipated or have to use laxatives? no  Do you have any history of drugs or alchohol?  no  Do you use supplemental oxygen?  no  Have you had a stroke or heart attack within the last 6 months? no  Do you take weight loss medication?  no  Do you take any blood-thinning medications such as: (aspirin, warfarin, Plavix, Aggrenox)  no  If yes we need the name, milligram, dosage and who is prescribing doctor  Current Outpatient Medications on File Prior to Visit  Medication Sig Dispense Refill   albuterol (VENTOLIN HFA) 108 (90 Base) MCG/ACT inhaler inhale 2 puffs into lungs every 6 hours as needed for wheezing or shortness of breath 6.7 g 0   Blood Glucose Monitoring Suppl DEVI 1 each by Does not apply route in the morning, at noon, and at bedtime. May substitute to any manufacturer covered by patient's insurance. 1 each 0   cyclobenzaprine (FLEXERIL) 10 MG tablet Take 1 tablet (10 mg total) by mouth 3 (three) times daily as needed for muscle spasms. Do not take with Robaxin 30 tablet 1   EPINEPHrine 0.3 mg/0.3 mL IJ SOAJ injection Inject 0.3 mg into the muscle as needed for anaphylaxis. 1 each 2   famotidine (PEPCID) 40 MG tablet Take 1 tablet (40 mg total) by mouth 2 (two) times daily. 60 tablet 5   fexofenadine (ALLEGRA ALLERGY) 180 MG tablet Take 2 tablets (360 mg total) by mouth in the morning and at bedtime. 120 tablet 5    gabapentin (NEURONTIN) 100 MG capsule      gabapentin (NEURONTIN) 300 MG capsule Take 300 mg by mouth 3 (three) times daily.     Glucose Blood (BLOOD GLUCOSE TEST STRIPS) STRP 1 each by In Vitro route in the morning, at noon, and at bedtime. May substitute to any manufacturer covered by patient's insurance. 100 strip 0   HYDROcodone-acetaminophen (NORCO/VICODIN) 5-325 MG tablet Take 1 tablet by mouth every 4 (four) hours as needed for moderate pain. 20 tablet 0   hydrOXYzine (VISTARIL) 25 MG capsule Take 1 capsule (25 mg total) by mouth at bedtime as needed for itching (hives). 60 capsule 1   insulin glargine (LANTUS SOLOSTAR) 100 UNIT/ML Solostar Pen Inject 10 Units into the skin at bedtime. 15 mL 2   Lancet Device MISC 1 each by Does not apply route in the morning, at noon, and at bedtime. May substitute to any manufacturer covered by patient's insurance. 1 each 0   Lancets Misc. MISC 1 each by Does not apply route in the morning, at noon, and at bedtime. May substitute to any manufacturer covered by patient's insurance. 100 each 0   meloxicam (MOBIC) 7.5 MG tablet Take 7.5 mg by mouth 2 (two) times daily.     metFORMIN (GLUCOPHAGE-XR) 500 MG 24 hr tablet Take 2 tablets (  1,000 mg total) by mouth 2 (two) times daily with a meal. 360 tablet 1   montelukast (SINGULAIR) 10 MG tablet Take 1 tablet (10 mg total) by mouth at bedtime. 30 tablet 5   nitroGLYCERIN (NITROSTAT) 0.4 MG SL tablet PLACE (1) TABLET UNDER TONGUE EVERY 5 MINUTES UP TO (3) DOSES. IF NO RELIEF CALL 911. 25 tablet 3   ondansetron (ZOFRAN) 4 MG tablet Take 1 tablet (4 mg total) by mouth every 8 (eight) hours as needed for nausea. 20 tablet 0   pantoprazole (PROTONIX) 40 MG tablet TAKE (1) TABLET TWICE A DAY BEFORE MEALS. (Patient taking differently: Take 40 mg by mouth daily.) 60 tablet 0   rosuvastatin (CRESTOR) 20 MG tablet take 1 tablet by mouth once daily. 30 tablet 0   Semaglutide,0.25 or 0.5MG /DOS, (OZEMPIC, 0.25 OR 0.5 MG/DOSE,) 2  MG/3ML SOPN Inject 0.25 mg into the skin once a week. 3 mL 0   sertraline (ZOLOFT) 50 MG tablet Take 1 tablet (50 mg total) by mouth in the morning. 90 tablet 2   Testosterone Undecanoate (JATENZO) 237 MG CAPS Take 1 capsule (237 mg total) by mouth in the morning and at bedtime. 60 capsule 5   Tiotropium Bromide-Olodaterol (STIOLTO RESPIMAT) 2.5-2.5 MCG/ACT AERS Inhale 2 puffs into the lungs daily. 1 each 5   Current Facility-Administered Medications on File Prior to Visit  Medication Dose Route Frequency Provider Last Rate Last Admin   omalizumab Geoffry Paradise) injection 300 mg  300 mg Subcutaneous Q14 Days Hetty Blend, FNP   300 mg at 07/13/23 1335    Allergies  Allergen Reactions   Celebrex [Celecoxib] Itching and Swelling    All over   Codeine Nausea And Vomiting and Other (See Comments)    Extreme stomach pain. This includes anything with the derivative of codeine in it. (Does tolerate hydrocodone)   Cortisone Swelling    SWELLING REACTION UNSPECIFIED    Doxycycline Swelling and Other (See Comments)    Made tongue turn black    Medrol [Methylprednisolone] Hives and Swelling    Angioedema    Prednisone Swelling    SWELLING REACTION UNSPECIFIED    Relafen [Nabumetone] Swelling    SWELLING REACTION UNSPECIFIED    Aspirin Other (See Comments)    Stomach cramps   Oxycodone-Acetaminophen Itching and Other (See Comments)    Can tolerate with benadryl    Raloxifene Rash   Rofecoxib Rash

## 2023-07-13 NOTE — Telephone Encounter (Signed)
Please see thread. Thanks

## 2023-07-14 NOTE — Telephone Encounter (Signed)
Attempted to reach pt to schedule TCS but phone rang multiple times and then a message came up that "call can not be completed at this time". No other number on file.

## 2023-07-20 ENCOUNTER — Telehealth: Payer: Self-pay

## 2023-07-20 NOTE — Telephone Encounter (Signed)
PA submitted.

## 2023-07-20 NOTE — Telephone Encounter (Signed)
Copied from CRM [id number]. Topic: Clinical - Prescription Issue >> Jul 20, 2023 11:28 AM Mosetta Putt H wrote: Reason for CRM: Semaglutide,0.25 or 0.5MG /DOS, (OZEMPIC, 0.25 OR 0.5 MG/DOSE,) 2 MG/3ML SOPN needs to be authorized

## 2023-07-21 NOTE — Telephone Encounter (Signed)
Spoke with pt wife. Pt wife states pt is going to neurosurgeon today and pt may possible by having an operation to heal a fusion. Pt wife will call back if they would like to schedule

## 2023-07-22 ENCOUNTER — Ambulatory Visit: Payer: PPO | Admitting: Orthopedic Surgery

## 2023-07-22 ENCOUNTER — Other Ambulatory Visit (INDEPENDENT_AMBULATORY_CARE_PROVIDER_SITE_OTHER): Payer: PPO

## 2023-07-22 ENCOUNTER — Telehealth: Payer: Self-pay

## 2023-07-22 DIAGNOSIS — Z720 Tobacco use: Secondary | ICD-10-CM

## 2023-07-22 DIAGNOSIS — M25512 Pain in left shoulder: Secondary | ICD-10-CM | POA: Diagnosis not present

## 2023-07-22 MED ORDER — NICOTINE 21 MG/24HR TD PT24
21.0000 mg | MEDICATED_PATCH | Freq: Every day | TRANSDERMAL | 0 refills | Status: DC
Start: 2023-07-22 — End: 2023-08-27

## 2023-07-22 NOTE — Telephone Encounter (Signed)
Copied from CRM [id number]. Topic: General - Other >> Jul 22, 2023  9:35 AM [name] wrote: Reason for CRM: [name], Barbette Or, called in stating that her husband, Christopher [name], went to neurosurgeon yesterday for his neck. She stated the neurosurgeon wants him to quit smoking. Mrs. Hulick is wanting to see if Dr. Durwin Nora can prescribe for pt to get patches for him to quit smoking. She stated they were told he will be placed in the hospital as well to possibly have neck re-done as well. Please give wife a call at [phone] to let her know whether or not he will be able to get the patches.

## 2023-07-22 NOTE — Telephone Encounter (Signed)
Patient advised.

## 2023-07-23 ENCOUNTER — Other Ambulatory Visit: Payer: Self-pay | Admitting: Urology

## 2023-07-23 DIAGNOSIS — E291 Testicular hypofunction: Secondary | ICD-10-CM

## 2023-07-23 DIAGNOSIS — N401 Enlarged prostate with lower urinary tract symptoms: Secondary | ICD-10-CM

## 2023-07-23 NOTE — Progress Notes (Deleted)
Name: Christopher [name] DOB: 29-Mar-1956 MRN: [id number]  History of Present Illness: Mr. Christopher [name] is a 67 y.o. male who presents today for return visit at [facility]. - GU history: 1. BPH with LUTS (frequency and nocturia x2-3). - 07/11/2022: Normal PSA (2.3). 2. Hypogonadism.  - Taking Jatenzo (testosterone replacement therapy).  At last visit with Dr. Ronne [name] on 05/20/2023: - Patient deferred therapy for BPH. - He reported fatigue despite taking Jatenzo 158 mg 2x/day. - The plan was: 1. Testosterone labs.  2. Decrease caffeine intake.  Since last visit: > 05/21/2023:  - Testosterone low (106).  - Normal CBC. No erythrocytosis. - Normal renal function (creatinine 0.88, GFR 95).  > 06/09/2023: Jatenzo dose increased to 237 mg 2x/day by Dr. Ronne [name].   > 07/08/2023:  - Normal CBC. No erythrocytosis. - Normal renal function (creatinine 1.09, GFR 75). - Negative urine microscopy.  > ***:  - Testosterone *** - CBC *** - PSA ***  Today: He reports ***  He reports *** urinary stream. He {Actions; denies-reports:120008} urinary hesitancy, urgency, frequency, dysuria, gross hematuria, straining to void, or sensations of incomplete emptying.  Patient {Actions; denies-reports:120008} improved ***fatigue, ***sexual desire/ libido, ***erectile dysfunction, ***muscle mass, ***weakness, and ***mood with increased Jatenzo dosing.    Fall Screening: Do you usually have a device to assist in your mobility? {yes/no:20286} ***cane / ***walker / ***wheelchair   Medications: Current Outpatient Medications  Medication Sig Dispense Refill   albuterol (VENTOLIN HFA) 108 (90 Base) MCG/ACT inhaler inhale 2 puffs into lungs every 6 hours as needed for wheezing or shortness of breath 6.7 g 0   Blood Glucose Monitoring Suppl DEVI 1 each by Does not apply route in the morning, at noon, and at bedtime. May substitute to any manufacturer covered by patient's insurance. 1 each 0    cyclobenzaprine (FLEXERIL) 10 MG tablet Take 1 tablet (10 mg total) by mouth 3 (three) times daily as needed for muscle spasms. Do not take with Robaxin 30 tablet 1   EPINEPHrine 0.3 mg/0.3 mL IJ SOAJ injection Inject 0.3 mg into the muscle as needed for anaphylaxis. 1 each 2   famotidine (PEPCID) 40 MG tablet Take 1 tablet (40 mg total) by mouth 2 (two) times daily. 60 tablet 5   fexofenadine (ALLEGRA ALLERGY) 180 MG tablet Take 2 tablets (360 mg total) by mouth in the morning and at bedtime. 120 tablet 5   gabapentin (NEURONTIN) 100 MG capsule      gabapentin (NEURONTIN) 300 MG capsule Take 300 mg by mouth 3 (three) times daily.     Glucose Blood (BLOOD GLUCOSE TEST STRIPS) STRP 1 each by In Vitro route in the morning, at noon, and at bedtime. May substitute to any manufacturer covered by patient's insurance. 100 strip 0   HYDROcodone-acetaminophen (NORCO/VICODIN) 5-325 MG tablet Take 1 tablet by mouth every 4 (four) hours as needed for moderate pain. 20 tablet 0   hydrOXYzine (VISTARIL) 25 MG capsule Take 1 capsule (25 mg total) by mouth at bedtime as needed for itching (hives). 60 capsule 1   insulin glargine (LANTUS SOLOSTAR) 100 UNIT/ML Solostar Pen Inject 10 Units into the skin at bedtime. 15 mL 2   Lancet Device MISC 1 each by Does not apply route in the morning, at noon, and at bedtime. May substitute to any manufacturer covered by patient's insurance. 1 each 0   Lancets Misc. MISC 1 each by Does not apply route in the morning, at noon, and at bedtime.  May substitute to any manufacturer covered by patient's insurance. 100 each 0   meloxicam (MOBIC) 7.5 MG tablet Take 7.5 mg by mouth 2 (two) times daily.     metFORMIN (GLUCOPHAGE-XR) 500 MG 24 hr tablet Take 2 tablets (1,000 mg total) by mouth 2 (two) times daily with a meal. 360 tablet 1   montelukast (SINGULAIR) 10 MG tablet Take 1 tablet (10 mg total) by mouth at bedtime. 30 tablet 5   nicotine (NICODERM CQ) 21 mg/24hr patch Place 1  patch (21 mg total) onto the skin daily. 28 patch 0   nitroGLYCERIN (NITROSTAT) 0.4 MG SL tablet PLACE (1) TABLET UNDER TONGUE EVERY 5 MINUTES UP TO (3) DOSES. IF NO RELIEF CALL 911. 25 tablet 3   ondansetron (ZOFRAN) 4 MG tablet Take 1 tablet (4 mg total) by mouth every 8 (eight) hours as needed for nausea. 20 tablet 0   pantoprazole (PROTONIX) 40 MG tablet TAKE (1) TABLET TWICE A DAY BEFORE MEALS. (Patient taking differently: Take 40 mg by mouth daily.) 60 tablet 0   rosuvastatin (CRESTOR) 20 MG tablet take 1 tablet by mouth once daily. 30 tablet 0   Semaglutide,0.25 or 0.5MG /DOS, (OZEMPIC, 0.25 OR 0.5 MG/DOSE,) 2 MG/3ML SOPN Inject 0.25 mg into the skin once a week. 3 mL 0   sertraline (ZOLOFT) 50 MG tablet Take 1 tablet (50 mg total) by mouth in the morning. 90 tablet 2   Testosterone Undecanoate (JATENZO) 237 MG CAPS Take 1 capsule (237 mg total) by mouth in the morning and at bedtime. 60 capsule 5   Tiotropium Bromide-Olodaterol (STIOLTO RESPIMAT) 2.5-2.5 MCG/ACT AERS Inhale 2 puffs into the lungs daily. 1 each 5   Current Facility-Administered Medications  Medication Dose Route Frequency Provider Last Rate Last Admin   omalizumab Christopher [name]) injection 300 mg  300 mg Subcutaneous Q14 Days Christopher Blend, FNP   300 mg at 07/13/23 1335    Allergies: Allergies  Allergen Reactions   Celebrex [Celecoxib] Itching and Swelling    All over   Codeine Nausea And Vomiting and Other (See Comments)    Extreme stomach pain. This includes anything with the derivative of codeine in it. (Does tolerate hydrocodone)   Cortisone Swelling    SWELLING REACTION UNSPECIFIED    Doxycycline Swelling and Other (See Comments)    Made tongue turn black    Medrol [Methylprednisolone] Hives and Swelling    Angioedema    Prednisone Swelling    SWELLING REACTION UNSPECIFIED    Relafen [Nabumetone] Swelling    SWELLING REACTION UNSPECIFIED    Aspirin Other (See Comments)    Stomach cramps   Oxycodone-Acetaminophen  Itching and Other (See Comments)    Can tolerate with benadryl    Raloxifene Rash   Rofecoxib Rash    Past Medical History:  Diagnosis Date   Anxiety    Arthritis    Asthma    BPH (benign prostatic hyperplasia)    Complication of anesthesia    pt had a hard time being able to move after spinal anesthesia , 3-4 hours   Depression    GERD (gastroesophageal reflux disease)    Gout    no meds   Headache(784.0)    otc meds prn   Heart murmur    dx as a child, no problems as an adult   History of hiatal hernia    Hyperlipidemia    IBS (irritable bowel syndrome)    PONV (postoperative nausea and vomiting)    Pre-diabetes  Borderline, diet and exercise, no med   Sleep apnea    uses CIPAP machine at night   Wears partial dentures    bottom partial   Past Surgical History:  Procedure Laterality Date   ANTERIOR CERVICAL DECOMP/DISCECTOMY FUSION N/A 10/13/2022   Procedure: ANTERIOR CERVICAL DISECTOMY FUSION - CERVICAL THREE-CERVICAL FOUR - CERVICAL FOUR-CERVICAL FIVE REMOVAL OF HARDWARE CERVICAL FIVE-CERVICAL SIX;  Surgeon: Donalee Citrin, MD;  Location: [facility];  Service: Neurosurgery;  Laterality: N/A;   APPENDECTOMY     BACK SURGERY  2002   neck and back fusion   BIOPSY  12/27/2015   Procedure: BIOPSY;  Surgeon: Malissa Hippo, MD;  Location: [facility];  Service: Endoscopy;;  Fundus biopsies and duodenal biopsies   BIOPSY  02/10/2020   Procedure: BIOPSY;  Surgeon: Malissa Hippo, MD;  Location: [facility];  Service: Endoscopy;;  antral   CARDIAC CATHETERIZATION     CARDIAC CATHETERIZATION N/A 09/04/2016   Procedure: Right/Left Heart Cath and Coronary Angiography;  Surgeon: Peter M Swaziland, MD;  Location: [facility] INVASIVE CV LAB;  Service: Cardiovascular;  Laterality: N/A;   CHOLECYSTECTOMY     CHONDROPLASTY  08/15/2011   Procedure: CHONDROPLASTY;  Surgeon: Fuller Canada, MD;  Location: [facility];  Service: Orthopedics;  Laterality: Left;   COLONOSCOPY  06/27/2011    Procedure: COLONOSCOPY;  Surgeon: Malissa Hippo, MD;  [facility]: [facility];  Service: Endoscopy;  Laterality: N/A;  9:00 / Pt to be here at 9am for 10:45 procedure, benign polyps removed   COLONOSCOPY N/A 10/05/2014   Procedure: COLONOSCOPY;  Surgeon: Malissa Hippo, MD;  [facility]: [facility];  Service: Endoscopy;  Laterality: N/A;  930   COLONOSCOPY N/A 02/11/2018   Procedure: COLONOSCOPY;  Surgeon: Malissa Hippo, MD;  [facility]: [facility];  Service: Endoscopy;  Laterality: N/A;  830   ESOPHAGOGASTRODUODENOSCOPY N/A 12/27/2015   Procedure: ESOPHAGOGASTRODUODENOSCOPY (EGD);  Surgeon: Malissa Hippo, MD;  [facility];  Service: Endoscopy;  Laterality: N/A;  3:00   ESOPHAGOGASTRODUODENOSCOPY (EGD) WITH PROPOFOL N/A 02/10/2020   Procedure: ESOPHAGOGASTRODUODENOSCOPY (EGD) WITH PROPOFOL;  Surgeon: Malissa Hippo, MD;  [facility];  Service: Endoscopy;  Laterality: N/A;  155   HERNIA REPAIR  1998   umbilical hernia   JOINT REPLACEMENT  2023   knee and shoulder   KNEE ARTHROSCOPY     left knee   KNEE ARTHROSCOPY     right knee    LUMBAR LAMINECTOMY/DECOMPRESSION MICRODISCECTOMY  09/14/2012   Procedure: LUMBAR LAMINECTOMY/DECOMPRESSION MICRODISCECTOMY 1 LEVEL;  Surgeon: Karn Cassis, MD;  Location: [facility];  Service: Neurosurgery;  Laterality: Right;  Right Lumbar three-four Diskectomy   neck fusion  2005   NECK SURGERY     PATELLA-FEMORAL ARTHROPLASTY Right 02/18/2022   Procedure: RIGHT KNEE PATELLA REPLACEMENT, CEMENTED;  Surgeon: Cammy Copa, MD;  Location: [facility];  Service: Orthopedics;  Laterality: Right;   POLYPECTOMY  02/11/2018   Procedure: POLYPECTOMY;  Surgeon: Malissa Hippo, MD;  [facility];  Service: Endoscopy;;  colon   REVISION TOTAL SHOULDER TO REVERSE TOTAL SHOULDER Left 12/16/2018   Procedure: REVISION TOTAL SHOULDER TO REVERSE TOTAL SHOULDER;  Surgeon: Cammy Copa, MD;  Location: [facility];  Service:  Orthopedics;  Laterality: Left;   SHOULDER ARTHROSCOPY WITH BICEPSTENOTOMY Left 09/23/2013   Procedure: SHOULDER ARTHROSCOPY WITH BICEPSTENOTOMY AND EXTENSIVE DEBRIDEMENT;  Surgeon: Vickki Hearing, MD;  Location: [facility];  Service: Orthopedics;  Laterality: Left;  SHOULDER ARTHROSCOPY WITH ROTATOR CUFF REPAIR Left 05/05/2014   Procedure: SHOULDER ARTHROSCOPY LIMITED DEBRIDEMENT;  Surgeon: Vickki Hearing, MD;  Location: [facility];  Service: Orthopedics;  Laterality: Left;   SHOULDER OPEN ROTATOR CUFF REPAIR Left 05/05/2014   Procedure: ROTATOR CUFF REPAIR SHOULDER OPEN;  Surgeon: Vickki Hearing, MD;  Location: [facility];  Service: Orthopedics;  Laterality: Left;   SHOULDER OPEN ROTATOR CUFF REPAIR Left 12/16/2018   Procedure: LEFT SHOULDER POSSIBLE SUBSCAPULARIS REPAIR VS. REVISION TO REVERSE TOTAL SHOULDER REPLACEMENT;  Surgeon: Cammy Copa, MD;  Location: [facility];  Service: Orthopedics;  Laterality: Left;   SHOULDER SURGERY Right    Open Mumford procedure   SPINAL FUSION     x2, 2003 and 2007   TOTAL KNEE ARTHROPLASTY Left 06/16/2017   Procedure: LEFT TOTAL KNEE ARTHROPLASTY;  Surgeon: Vickki Hearing, MD;  Location: [facility];  Service: Orthopedics;  Laterality: Left;   TOTAL KNEE ARTHROPLASTY Right 05/14/2021   Procedure: TOTAL KNEE ARTHROPLASTY;  Surgeon: Vickki Hearing, MD;  Location: [facility];  Service: Orthopedics;  Laterality: Right;   TOTAL SHOULDER ARTHROPLASTY Left 08/19/2018   Procedure: left shoulder replacement;  Surgeon: Cammy Copa, MD;  Location: [facility] OR;  Service: Orthopedics;  Laterality: Left;   Family History  Problem Relation Age of Onset   Diabetes Mother    Alzheimer's disease Father    Diabetes Father    Diabetes Other    Lung disease Other    Arthritis Other    Anesthesia problems Neg Hx    Hypotension Neg Hx    Malignant hyperthermia Neg Hx    Pseudochol deficiency Neg Hx    Sleep apnea Neg Hx    Social History   Socioeconomic  History   Marital status: Married    Spouse name: Not on file   Number of children: Not on file   Years of education: Not on file   Highest education level: Not on file  Occupational History   Occupation: Therapist, music    Employer: DISABLED  Tobacco Use   Smoking status: Former    Current packs/day: 0.00    Average packs/day: 1 pack/day for 44.0 years (44.0 ttl pk-yrs)    Types: Cigarettes    Start date: 08/25/1978    Quit date: 08/25/2022    Years since quitting: 0.9    Passive exposure: Current   Smokeless tobacco: Never   Tobacco comments:    smokes a pack a day. since age 21  Vaping Use   Vaping status: Never Used  Substance and Sexual Activity   Alcohol use: Never   Drug use: Never   Sexual activity: Yes  Other Topics Concern   Not on file  Social History Narrative   Lives at home with wife   Right handed   Caffeine: 2 cups of coffee, 2 cans of soda, 4-5 cups of tea daily.   Social Determinants of Health   Financial Resource Strain: Low Risk  (09/17/2022)   Overall Financial Resource Strain (CARDIA)    Difficulty of Paying Living Expenses: Not hard at all  Food Insecurity: No Food Insecurity (10/25/2022)   Hunger Vital Sign    Worried About Running Out of Food in the Last Year: Never true    Ran Out of Food in the Last Year: Never true  Transportation Needs: No Transportation Needs (10/25/2022)   PRAPARE - Administrator, Civil Service (Medical): No    Lack of Transportation (Non-Medical): No  Physical Activity: Inactive (09/17/2022)   Exercise Vital Sign    Days of Exercise per Week: 0 days    Minutes of Exercise per Session: 0 min  Stress: Stress Concern Present (09/17/2022)   [employer] of Occupational Health - Occupational Stress Questionnaire    Feeling of Stress : Very much  Social Connections: Socially Isolated (09/17/2022)   Social Connection and Isolation Panel [NHANES]    Frequency of Communication with Friends and Family: Once a  week    Frequency of Social Gatherings with Friends and Family: Once a week    Attends Religious Services: Never    Database administrator or Organizations: No    Attends Banker Meetings: Never    Marital Status: Married  Catering manager Violence: Not At Risk (10/25/2022)   Humiliation, Afraid, Rape, and Kick questionnaire    Fear of Current or Ex-Partner: No    Emotionally Abused: No    Physically Abused: No    Sexually Abused: No    Review of Systems Constitutional: Patient denies any unintentional weight loss or change in strength lntegumentary: Patient denies any rashes or pruritus Cardiovascular: Patient denies chest pain or syncope Respiratory: Patient denies shortness of breath Gastrointestinal: Patient ***denies nausea, vomiting, constipation, or diarrhea Musculoskeletal: Patient denies muscle cramps or weakness Neurologic: Patient denies convulsions or seizures Allergic/Immunologic: Patient denies recent allergic reaction(s) Hematologic/Lymphatic: Patient denies bleeding tendencies Endocrine: Patient denies heat/cold intolerance  GU: As per HPI.  OBJECTIVE There were no vitals filed for this visit. There is no height or weight on file to calculate BMI.  Physical Examination*** Constitutional: No obvious distress; patient is non-toxic appearing  Cardiovascular: No visible lower extremity edema.  Respiratory: The patient does not have audible wheezing/stridor; respirations do not appear labored  Gastrointestinal: Abdomen non-distended Musculoskeletal: Normal ROM of UEs  Skin: No obvious rashes/open sores  Neurologic: CN 2-12 grossly intact Psychiatric: Answered questions appropriately with normal affect  Hematologic/Lymphatic/Immunologic: No obvious bruises or sites of spontaneous bleeding  UA: ***negative *** WBC/hpf, *** RBC/hpf, *** bacteria ***with no evidence of UTI ***with no evidence of microscopic hematuria PVR: *** ml  ASSESSMENT No  diagnosis found. ***  Will plan for follow up in *** months / ***1 year or sooner if needed. Pt verbalized understanding and agreement. All questions were answered.  PLAN Advised the following: 1. *** 2. ***No follow-ups on file.  No orders of the defined types were placed in this encounter.   It has been explained that the patient is to follow regularly with their PCP in addition to all other providers involved in their care and to follow instructions provided by these respective offices. Patient advised to contact urology clinic if any urologic-pertaining questions, concerns, new symptoms or problems arise in the interim period.  There are no Patient Instructions on file for this visit.  Electronically signed by:  Donnita Falls, FNP   07/23/23    4:55 PM

## 2023-07-24 ENCOUNTER — Telehealth: Payer: Self-pay

## 2023-07-24 ENCOUNTER — Encounter: Payer: Self-pay | Admitting: Orthopedic Surgery

## 2023-07-24 NOTE — Progress Notes (Unsigned)
Office Visit Note   Patient: Christopher [name]           Date of Birth: 1955-09-22           MRN: [id number] Visit Date: 07/22/2023 Requested by: Billie Lade, MD [address] [place],  [place] [address] PCP: Billie Lade, MD  Subjective: Chief Complaint  Patient presents with   Left Shoulder - Pain    HPI: Christopher [name] is a 67 y.o. male who presents to the office reporting left shoulder pain.  Been going on 2 months.  Worse since he has been in physical therapy.  The therapist told him that his shoulder was "out of joint".  Denies any radicular pain or numbness and tingling.  Scheduled for neck surgery in December with Dr. Wynetta Emery.  Patient does report neck pain radiating into both shoulders..                ROS: All systems reviewed are negative as they relate to the chief complaint within the history of present illness.  Patient denies fevers or chills.  Assessment & Plan: Visit Diagnoses:  1. Left shoulder pain, unspecified chronicity     Plan: Impression is radiographically and clinically structurally intact left shoulder replacement.  No indication for any further workup on the shoulder.  He will follow-up with [place] as needed.  Follow-Up Instructions: No follow-ups on file.   Orders:  Orders Placed This Encounter  Procedures   XR Shoulder Left   No orders of the defined types were placed in this encounter.     Procedures: No procedures performed   Clinical Data: No additional findings.  Objective: Vital Signs: There were no vitals taken for this visit.  Physical Exam:  Constitutional: Patient appears well-developed HEENT:  Head: Normocephalic Eyes:EOM are normal Neck: Normal range of motion Cardiovascular: Normal rate Pulmonary/chest: Effort normal Neurologic: Patient is alert Skin: Skin is warm Psychiatric: Patient has normal mood and affect  Ortho Exam: Ortho exam demonstrates functional deltoid.  5 out of 5 grip EPL FPL interosseous  are/extension bicep triceps and deltoid strength.  Radial pulses intact.  No masses lymphadenopathy or skin changes noted in that shoulder girdle region.  Has good range of motion as well with forward flexion and abduction both above 90 degrees.  Specialty Comments:  No specialty comments available.  Imaging: No results found.   PMFS History: Patient Active Problem List   Diagnosis Date Noted   Type 2 diabetes mellitus with hyperglycemia (HCC) 07/08/2023   Abdominal pain 06/12/2023   Prediabetes 01/05/2023   History of anaphylaxis 11/13/2022   Idiopathic urticaria 11/03/2022   Angioedema 10/24/2022   Spinal stenosis in cervical region 10/13/2022   DOE (dyspnea on exertion) 09/26/2022   Abnormal EKG 09/18/2022   Hypogonadism in male 07/07/2022   Depression, recurrent (HCC) 06/06/2022   Chronic musculoskeletal pain 06/06/2022   Fatigue 06/06/2022   Current tobacco use 06/06/2022   S/P right knee surgery 02/18/2022   S/P TKR (total knee replacement), right 05/14/21 07/02/2021   Status post total right knee replacement 05/14/2021   Primary osteoarthritis of right knee    Renal cyst 10/03/2020   Frequency of micturition 10/03/2020   Benign prostatic hyperplasia with urinary obstruction 10/03/2020   IBS (irritable bowel syndrome) 07/05/2020   Lumbar disc herniation 06/29/2019   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HCC)    Primary osteoarthritis, left shoulder    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   History of colonic polyps  11/26/2017   S/P total knee replacement, left 06/16/17 06/16/2017   Primary osteoarthritis of left knee    HLD (hyperlipidemia) 09/04/2016   Angina pectoris (HCC) 09/04/2016   OSA (obstructive sleep apnea) 09/04/2016   COPD (chronic obstructive pulmonary disease) (HCC) 09/04/2016   Rotator cuff tear 05/05/2014   S/P shoulder surgery 09/26/2013   Arthritis, shoulder region 09/26/2013   Bursitis, shoulder 09/26/2013   Synovitis of shoulder 09/26/2013    Labral tear of shoulder, degenerative 09/26/2013   Biceps tendon tear 09/26/2013   Rotator cuff syndrome of left shoulder 06/21/2013   Arthritis 06/21/2013   Patellofemoral arthritis of right knee 03/29/2013   Effusion of knee joint 03/29/2013   Bursitis/tendonitis, shoulder 03/10/2013   Effusion of knee joint, left 08/18/2011   Knee pain 08/18/2011   Acute torn meniscus 07/30/2011   Old torn meniscus of knee 07/30/2011   Arthropathy, lower leg 10/15/2010   MEDIAL MENISCUS TEAR, RIGHT 10/15/2010   HIP PAIN 01/15/2010   DEGENERATIVE DISC DISEASE, LUMBOSACRAL SPINE W/RADICULOPATHY 01/15/2010   PLICA SYNDROME 08/09/2009   DERANGEMENT MENISCUS 07/09/2009   JOINT EFFUSION, LEFT KNEE 07/09/2009   Unilateral primary osteoarthritis, left knee 01/30/2009   KNEE PAIN 01/30/2009   Sprain of ankle 11/08/2007   Past Medical History:  Diagnosis Date   Anxiety    Arthritis    Asthma    BPH (benign prostatic hyperplasia)    Complication of anesthesia    pt had a hard time being able to move after spinal anesthesia , 3-4 hours   Depression    GERD (gastroesophageal reflux disease)    Gout    no meds   Headache(784.0)    otc meds prn   Heart murmur    dx as a child, no problems as an adult   History of hiatal hernia    Hyperlipidemia    IBS (irritable bowel syndrome)    PONV (postoperative nausea and vomiting)    Pre-diabetes    Borderline, diet and exercise, no med   Sleep apnea    uses CIPAP machine at night   Wears partial dentures    bottom partial    Family History  Problem Relation Age of Onset   Diabetes Mother    Alzheimer's disease Father    Diabetes Father    Diabetes Other    Lung disease Other    Arthritis Other    Anesthesia problems Neg Hx    Hypotension Neg Hx    Malignant hyperthermia Neg Hx    Pseudochol deficiency Neg Hx    Sleep apnea Neg Hx     Past Surgical History:  Procedure Laterality Date   ANTERIOR CERVICAL DECOMP/DISCECTOMY FUSION N/A  10/13/2022   Procedure: ANTERIOR CERVICAL DISECTOMY FUSION - CERVICAL THREE-CERVICAL FOUR - CERVICAL FOUR-CERVICAL FIVE REMOVAL OF HARDWARE CERVICAL FIVE-CERVICAL SIX;  Surgeon: Donalee Citrin, MD;  Location: [facility];  Service: Neurosurgery;  Laterality: N/A;   APPENDECTOMY     BACK SURGERY  2002   neck and back fusion   BIOPSY  12/27/2015   Procedure: BIOPSY;  Surgeon: Malissa Hippo, MD;  Location: [facility];  Service: Endoscopy;;  Fundus biopsies and duodenal biopsies   BIOPSY  02/10/2020   Procedure: BIOPSY;  Surgeon: Malissa Hippo, MD;  [facility]: [facility];  Service: Endoscopy;;  antral   CARDIAC CATHETERIZATION     CARDIAC CATHETERIZATION N/A 09/04/2016   Procedure: Right/Left Heart Cath and Coronary Angiography;  Surgeon: Peter M [place], MD;  [facility] IN[facility]  CV LAB;  Service: Cardiovascular;  Laterality: N/A;   CHOLECYSTECTOMY     CHONDROPLASTY  08/15/2011   Procedure: CHONDROPLASTY;  Surgeon: Fuller Canada, MD;  Location: [facility];  Service: Orthopedics;  Laterality: Left;   COLONOSCOPY  06/27/2011   Procedure: COLONOSCOPY;  Surgeon: Malissa Hippo, MD;  [facility]: [facility];  Service: Endoscopy;  Laterality: N/A;  9:00 / Pt to be here at 9am for 10:45 procedure, benign polyps removed   COLONOSCOPY N/A 10/05/2014   Procedure: COLONOSCOPY;  Surgeon: Malissa Hippo, MD;  [facility]: [facility];  Service: Endoscopy;  Laterality: N/A;  930   COLONOSCOPY N/A 02/11/2018   Procedure: COLONOSCOPY;  Surgeon: Malissa Hippo, MD;  [facility];  Service: Endoscopy;  Laterality: N/A;  830   ESOPHAGOGASTRODUODENOSCOPY N/A 12/27/2015   Procedure: ESOPHAGOGASTRODUODENOSCOPY (EGD);  Surgeon: Malissa Hippo, MD;  [facility];  Service: Endoscopy;  Laterality: N/A;  3:00   ESOPHAGOGASTRODUODENOSCOPY (EGD) WITH PROPOFOL N/A 02/10/2020   Procedure: ESOPHAGOGASTRODUODENOSCOPY (EGD) WITH PROPOFOL;  Surgeon: Malissa Hippo, MD;  [facility];   Service: Endoscopy;  Laterality: N/A;  155   HERNIA REPAIR  1998   umbilical hernia   JOINT REPLACEMENT  2023   knee and shoulder   KNEE ARTHROSCOPY     left knee   KNEE ARTHROSCOPY     right knee    LUMBAR LAMINECTOMY/DECOMPRESSION MICRODISCECTOMY  09/14/2012   Procedure: LUMBAR LAMINECTOMY/DECOMPRESSION MICRODISCECTOMY 1 LEVEL;  Surgeon: Karn Cassis, MD;  Location: [facility];  Service: Neurosurgery;  Laterality: Right;  Right Lumbar three-four Diskectomy   neck fusion  2005   NECK SURGERY     PATELLA-FEMORAL ARTHROPLASTY Right 02/18/2022   Procedure: RIGHT KNEE PATELLA REPLACEMENT, CEMENTED;  Surgeon: Cammy Copa, MD;  Location: [facility];  Service: Orthopedics;  Laterality: Right;   POLYPECTOMY  02/11/2018   Procedure: POLYPECTOMY;  Surgeon: Malissa Hippo, MD;  [facility];  Service: Endoscopy;;  colon   REVISION TOTAL SHOULDER TO REVERSE TOTAL SHOULDER Left 12/16/2018   Procedure: REVISION TOTAL SHOULDER TO REVERSE TOTAL SHOULDER;  Surgeon: Cammy Copa, MD;  Location: [facility];  Service: Orthopedics;  Laterality: Left;   SHOULDER ARTHROSCOPY WITH BICEPSTENOTOMY Left 09/23/2013   Procedure: SHOULDER ARTHROSCOPY WITH BICEPSTENOTOMY AND EXTENSIVE DEBRIDEMENT;  Surgeon: Vickki Hearing, MD;  Location: [facility];  Service: Orthopedics;  Laterality: Left;   SHOULDER ARTHROSCOPY WITH ROTATOR CUFF REPAIR Left 05/05/2014   Procedure: SHOULDER ARTHROSCOPY LIMITED DEBRIDEMENT;  Surgeon: Vickki Hearing, MD;  Location: [facility];  Service: Orthopedics;  Laterality: Left;   SHOULDER OPEN ROTATOR CUFF REPAIR Left 05/05/2014   Procedure: ROTATOR CUFF REPAIR SHOULDER OPEN;  Surgeon: Vickki Hearing, MD;  Location: [facility];  Service: Orthopedics;  Laterality: Left;   SHOULDER OPEN ROTATOR CUFF REPAIR Left 12/16/2018   Procedure: LEFT SHOULDER POSSIBLE SUBSCAPULARIS REPAIR VS. REVISION TO REVERSE TOTAL SHOULDER REPLACEMENT;  Surgeon: Cammy Copa, MD;  Location:  [facility];  Service: Orthopedics;  Laterality: Left;   SHOULDER SURGERY Right    Open Mumford procedure   SPINAL FUSION     x2, 2003 and 2007   TOTAL KNEE ARTHROPLASTY Left 06/16/2017   Procedure: LEFT TOTAL KNEE ARTHROPLASTY;  Surgeon: Vickki Hearing, MD;  Location: [facility];  Service: Orthopedics;  Laterality: Left;   TOTAL KNEE ARTHROPLASTY Right 05/14/2021   Procedure: TOTAL KNEE ARTHROPLASTY;  Surgeon: Vickki Hearing, MD;  Location: [facility];  Service:  Orthopedics;  Laterality: Right;   TOTAL SHOULDER ARTHROPLASTY Left 08/19/2018   Procedure: left shoulder replacement;  Surgeon: Cammy Copa, MD;  Location: [facility] OR;  Service: Orthopedics;  Laterality: Left;   Social History   Occupational History   Occupation: Event organiser: DISABLED  Tobacco Use   Smoking status: Former    Current packs/day: 0.00    Average packs/day: 1 pack/day for 44.0 years (44.0 ttl pk-yrs)    Types: Cigarettes    Start date: 08/25/1978    Quit date: 08/25/2022    Years since quitting: 0.9    Passive exposure: Current   Smokeless tobacco: Never   Tobacco comments:    smokes a pack a day. since age 70  Vaping Use   Vaping status: Never Used  Substance and Sexual Activity   Alcohol use: Never   Drug use: Never   Sexual activity: Yes

## 2023-07-24 NOTE — Telephone Encounter (Signed)
Patient called with no answer. Detailed message left with request to call back Monday morning.

## 2023-07-24 NOTE — Telephone Encounter (Signed)
-----   Message from Donnita Falls sent at 07/23/2023  5:04 PM EST ----- Please instruct patient to get labs drawn 1-2 days before his visit on 07/28/2023 (to recheck his PSA & testosterone). Thanks.

## 2023-07-28 ENCOUNTER — Ambulatory Visit: Payer: PPO | Admitting: Urology

## 2023-07-28 DIAGNOSIS — N401 Enlarged prostate with lower urinary tract symptoms: Secondary | ICD-10-CM

## 2023-07-28 DIAGNOSIS — E291 Testicular hypofunction: Secondary | ICD-10-CM

## 2023-07-28 DIAGNOSIS — R350 Frequency of micturition: Secondary | ICD-10-CM

## 2023-08-06 ENCOUNTER — Ambulatory Visit: Payer: PPO | Admitting: Internal Medicine

## 2023-08-06 ENCOUNTER — Encounter: Payer: Self-pay | Admitting: Internal Medicine

## 2023-08-06 VITALS — BP 154/75 | HR 59 | Ht 66.0 in | Wt 231.0 lb

## 2023-08-06 DIAGNOSIS — Z7984 Long term (current) use of oral hypoglycemic drugs: Secondary | ICD-10-CM

## 2023-08-06 DIAGNOSIS — Z794 Long term (current) use of insulin: Secondary | ICD-10-CM

## 2023-08-06 DIAGNOSIS — E1165 Type 2 diabetes mellitus with hyperglycemia: Secondary | ICD-10-CM | POA: Diagnosis not present

## 2023-08-06 MED ORDER — OZEMPIC (0.25 OR 0.5 MG/DOSE) 2 MG/3ML ~~LOC~~ SOPN
0.5000 mg | PEN_INJECTOR | SUBCUTANEOUS | 0 refills | Status: DC
Start: 2023-08-06 — End: 2023-09-28

## 2023-08-06 NOTE — Patient Instructions (Signed)
It was a pleasure to see you today.  Thank you for giving [place] the opportunity to be involved in your care.  Below is a brief recap of your visit and next steps.  We will plan to see you again in 3 months.  Summary Increase Ozempic to 0.5 mg weekly after you completed 3 additional injections of 0.25 Continue Lantus at 20 units Please let me know after you complete 3 injections of 0.5 mg and we will plan to increase Ozempic to 1 mg weekly Follow up in 3 months

## 2023-08-06 NOTE — Progress Notes (Signed)
Established Patient Office Visit  Subjective   Patient ID: Christopher [name], male    DOB: 1956-06-01  Age: 67 y.o. MRN: [id number]  Chief Complaint  Patient presents with   Diabetes   Mr. Christopher [name] returns to care today for follow-up of diabetes mellitus.  He was last evaluated by me on 11/6.  At that time he endorsed symptoms of polyuria, polydipsia, and polyphagia.  His point-of-care blood sugar reading was undetectably high.  He had a previously noted history of prediabetes with A1c 6.4 but met criteria for diabetes mellitus given undetectably high blood sugar reading.  Metformin was increased to 1000 mg twice daily.  Lantus 10 units nightly was started and he was instructed to increase dosing in increments of 4 units for blood sugar readings greater than 200.  Ozempic 0.25 mg weekly was also started.  Close follow-up was arranged for reassessment.  In the interim, he has been seen by orthopedic surgery for follow-up.  Mr. Christopher [name] reports feeling fairly well today.  Symptoms of polydipsia, polyphagia, and polyuria have resolved.  He has not experienced any adverse side effects since increasing metformin, starting Lantus, and starting Ozempic.  He completed his first injection of Ozempic earlier this week.  Blood sugar readings are trending down overall.  He does not have any additional concerns to discuss today.  Past Medical History:  Diagnosis Date   Anxiety    Arthritis    Asthma    BPH (benign prostatic hyperplasia)    Complication of anesthesia    pt had a hard time being able to move after spinal anesthesia , 3-4 hours   Depression    GERD (gastroesophageal reflux disease)    Gout    no meds   Headache(784.0)    otc meds prn   Heart murmur    dx as a child, no problems as an adult   History of hiatal hernia    Hyperlipidemia    IBS (irritable bowel syndrome)    PONV (postoperative nausea and vomiting)    Pre-diabetes    Borderline, diet and exercise, no med   Sleep apnea     uses CIPAP machine at night   Wears partial dentures    bottom partial   Past Surgical History:  Procedure Laterality Date   ANTERIOR CERVICAL DECOMP/DISCECTOMY FUSION N/A 10/13/2022   Procedure: ANTERIOR CERVICAL DISECTOMY FUSION - CERVICAL THREE-CERVICAL FOUR - CERVICAL FOUR-CERVICAL FIVE REMOVAL OF HARDWARE CERVICAL FIVE-CERVICAL SIX;  Surgeon: Donalee Citrin, MD;  Location: [facility];  Service: Neurosurgery;  Laterality: N/A;   APPENDECTOMY     BACK SURGERY  2002   neck and back fusion   BIOPSY  12/27/2015   Procedure: BIOPSY;  Surgeon: Malissa Hippo, MD;  Location: [facility];  Service: Endoscopy;;  Fundus biopsies and duodenal biopsies   BIOPSY  02/10/2020   Procedure: BIOPSY;  Surgeon: Malissa Hippo, MD;  [facility]: [facility];  Service: Endoscopy;;  antral   CARDIAC CATHETERIZATION     CARDIAC CATHETERIZATION N/A 09/04/2016   Procedure: Right/Left Heart Cath and Coronary Angiography;  Surgeon: Peter M Swaziland, MD;  Location: [facility] INVASIVE CV LAB;  Service: Cardiovascular;  Laterality: N/A;   CHOLECYSTECTOMY     CHONDROPLASTY  08/15/2011   Procedure: CHONDROPLASTY;  Surgeon: Fuller Canada, MD;  Location: [facility] OR[facility];  Service: Orthopedics;  Laterality: Left;   COLONOSCOPY  06/27/2011   Procedure: COLONOSCOPY;  Surgeon: Malissa Hippo, MD;  [facility]: [facility];  Service: Endoscopy;  Laterality: N/A;  9:00 / Pt to be here at 9am for 10:45 procedure, benign polyps removed   COLONOSCOPY N/A 10/05/2014   Procedure: COLONOSCOPY;  Surgeon: Malissa Hippo, MD;  [facility];  Service: Endoscopy;  Laterality: N/A;  930   COLONOSCOPY N/A 02/11/2018   Procedure: COLONOSCOPY;  Surgeon: Malissa Hippo, MD;  [facility];  Service: Endoscopy;  Laterality: N/A;  830   ESOPHAGOGASTRODUODENOSCOPY N/A 12/27/2015   Procedure: ESOPHAGOGASTRODUODENOSCOPY (EGD);  Surgeon: Malissa Hippo, MD;  [facility];  Service: Endoscopy;  Laterality: N/A;  3:00    ESOPHAGOGASTRODUODENOSCOPY (EGD) WITH PROPOFOL N/A 02/10/2020   Procedure: ESOPHAGOGASTRODUODENOSCOPY (EGD) WITH PROPOFOL;  Surgeon: Malissa Hippo, MD;  [facility];  Service: Endoscopy;  Laterality: N/A;  155   HERNIA REPAIR  1998   umbilical hernia   JOINT REPLACEMENT  2023   knee and shoulder   KNEE ARTHROSCOPY     left knee   KNEE ARTHROSCOPY     right knee    LUMBAR LAMINECTOMY/DECOMPRESSION MICRODISCECTOMY  09/14/2012   Procedure: LUMBAR LAMINECTOMY/DECOMPRESSION MICRODISCECTOMY 1 LEVEL;  Surgeon: Karn Cassis, MD;  Location: [facility];  Service: Neurosurgery;  Laterality: Right;  Right Lumbar three-four Diskectomy   neck fusion  2005   NECK SURGERY     PATELLA-FEMORAL ARTHROPLASTY Right 02/18/2022   Procedure: RIGHT KNEE PATELLA REPLACEMENT, CEMENTED;  Surgeon: Cammy Copa, MD;  Location: [facility];  Service: Orthopedics;  Laterality: Right;   POLYPECTOMY  02/11/2018   Procedure: POLYPECTOMY;  Surgeon: Malissa Hippo, MD;  [facility];  Service: Endoscopy;;  colon   REVISION TOTAL SHOULDER TO REVERSE TOTAL SHOULDER Left 12/16/2018   Procedure: REVISION TOTAL SHOULDER TO REVERSE TOTAL SHOULDER;  Surgeon: Cammy Copa, MD;  [facility];  Service: Orthopedics;  Laterality: Left;   SHOULDER ARTHROSCOPY WITH BICEPSTENOTOMY Left 09/23/2013   Procedure: SHOULDER ARTHROSCOPY WITH BICEPSTENOTOMY AND EXTENSIVE DEBRIDEMENT;  Surgeon: Vickki Hearing, MD;  Location: [facility];  Service: Orthopedics;  Laterality: Left;   SHOULDER ARTHROSCOPY WITH ROTATOR CUFF REPAIR Left 05/05/2014   Procedure: SHOULDER ARTHROSCOPY LIMITED DEBRIDEMENT;  Surgeon: Vickki Hearing, MD;  Location: [facility];  Service: Orthopedics;  Laterality: Left;   SHOULDER OPEN ROTATOR CUFF REPAIR Left 05/05/2014   Procedure: ROTATOR CUFF REPAIR SHOULDER OPEN;  Surgeon: Vickki Hearing, MD;  Location: [facility];  Service: Orthopedics;  Laterality: Left;   SHOULDER OPEN ROTATOR  CUFF REPAIR Left 12/16/2018   Procedure: LEFT SHOULDER POSSIBLE SUBSCAPULARIS REPAIR VS. REVISION TO REVERSE TOTAL SHOULDER REPLACEMENT;  Surgeon: Cammy Copa, MD;  Location: [facility];  Service: Orthopedics;  Laterality: Left;   SHOULDER SURGERY Right    Open Mumford procedure   SPINAL FUSION     x2, 2003 and 2007   TOTAL KNEE ARTHROPLASTY Left 06/16/2017   Procedure: LEFT TOTAL KNEE ARTHROPLASTY;  Surgeon: Vickki Hearing, MD;  Location: [facility];  Service: Orthopedics;  Laterality: Left;   TOTAL KNEE ARTHROPLASTY Right 05/14/2021   Procedure: TOTAL KNEE ARTHROPLASTY;  Surgeon: Vickki Hearing, MD;  Location: [facility];  Service: Orthopedics;  Laterality: Right;   TOTAL SHOULDER ARTHROPLASTY Left 08/19/2018   Procedure: left shoulder replacement;  Surgeon: Cammy Copa, MD;  Location: [facility] OR;  Service: Orthopedics;  Laterality: Left;   Social History   Tobacco Use   Smoking status: Former    Current packs/day: 0.00    Average packs/day: 1 pack/day for 44.0  years (44.0 ttl pk-yrs)    Types: Cigarettes    Start date: 08/25/1978    Quit date: 08/25/2022    Years since quitting: 0.9    Passive exposure: Current   Smokeless tobacco: Never   Tobacco comments:    smokes a pack a day. since age 12  Vaping Use   Vaping status: Never Used  Substance Use Topics   Alcohol use: Never   Drug use: Never   Family History  Problem Relation Age of Onset   Diabetes Mother    Alzheimer's disease Father    Diabetes Father    Diabetes Other    Lung disease Other    Arthritis Other    Anesthesia problems Neg Hx    Hypotension Neg Hx    Malignant hyperthermia Neg Hx    Pseudochol deficiency Neg Hx    Sleep apnea Neg Hx    Allergies  Allergen Reactions   Celebrex [Celecoxib] Itching and Swelling    All over   Codeine Nausea And Vomiting and Other (See Comments)    Extreme stomach pain. This includes anything with the derivative of codeine in it. (Does tolerate hydrocodone)    Cortisone Swelling    SWELLING REACTION UNSPECIFIED    Doxycycline Swelling and Other (See Comments)    Made tongue turn black    Medrol [Methylprednisolone] Hives and Swelling    Angioedema    Prednisone Swelling    SWELLING REACTION UNSPECIFIED    Relafen [Nabumetone] Swelling    SWELLING REACTION UNSPECIFIED    Aspirin Other (See Comments)    Stomach cramps   Oxycodone-Acetaminophen Itching and Other (See Comments)    Can tolerate with benadryl    Raloxifene Rash   Rofecoxib Rash   Review of Systems  Constitutional:  Negative for chills and fever.  HENT:  Negative for sore throat.   Respiratory:  Negative for cough and shortness of breath.   Cardiovascular:  Negative for chest pain, palpitations and leg swelling.  Gastrointestinal:  Negative for abdominal pain, blood in stool, constipation, diarrhea, nausea and vomiting.  Genitourinary:  Negative for dysuria and hematuria.  Musculoskeletal:  Negative for myalgias.  Skin:  Negative for itching and rash.  Neurological:  Negative for dizziness and headaches.  Psychiatric/Behavioral:  Negative for depression and suicidal ideas.      Objective:     BP (!) 154/75 (BP Location: Right Arm, Patient Position: Sitting, Cuff Size: Large)   Pulse (!) 59   Ht 5\' 6"  (1.676 m)   Wt 231 lb (104.8 kg)   SpO2 96%   BMI 37.28 kg/m  BP Readings from Last 3 Encounters:  08/06/23 (!) 154/75  07/08/23 131/78  06/12/23 126/82   Physical Exam Vitals reviewed.  Constitutional:      General: He is not in acute distress.    Appearance: Normal appearance. He is obese. He is not ill-appearing.  HENT:     Head: Normocephalic and atraumatic.     Right Ear: External ear normal.     Left Ear: External ear normal.     Nose: Nose normal. No congestion or rhinorrhea.     Mouth/Throat:     Mouth: Mucous membranes are moist.     Pharynx: Oropharynx is clear.  Eyes:     General: No scleral icterus.    Extraocular Movements: Extraocular  movements intact.     Conjunctiva/sclera: Conjunctivae normal.     Pupils: Pupils are equal, round, and reactive to light.  Cardiovascular:  Rate and Rhythm: Normal rate and regular rhythm.     Pulses: Normal pulses.     Heart sounds: Normal heart sounds. No murmur heard. Pulmonary:     Effort: Pulmonary effort is normal.     Breath sounds: Normal breath sounds. No wheezing, rhonchi or rales.  Abdominal:     General: Abdomen is flat. Bowel sounds are normal. There is no distension.     Palpations: Abdomen is soft.     Tenderness: There is no abdominal tenderness.  Musculoskeletal:        General: No swelling or deformity. Normal range of motion.     Cervical back: Normal range of motion.  Skin:    General: Skin is warm and dry.     Capillary Refill: Capillary refill takes less than 2 seconds.  Neurological:     General: No focal deficit present.     Mental Status: He is alert and oriented to person, place, and time.     Motor: No weakness.  Psychiatric:        Mood and Affect: Mood normal.        Behavior: Behavior normal.        Thought Content: Thought content normal.   Last CBC Lab Results  Component Value Date   WBC 9.6 07/08/2023   HGB 15.8 07/08/2023   HCT 48.2 07/08/2023   MCV 86 07/08/2023   MCH 28.1 07/08/2023   RDW 14.2 07/08/2023   PLT 225 07/08/2023   Last metabolic panel Lab Results  Component Value Date   GLUCOSE 601 (HH) 07/08/2023   NA 130 (L) 07/08/2023   K 4.5 07/08/2023   CL 92 (L) 07/08/2023   CO2 21 07/08/2023   BUN 30 (H) 07/08/2023   CREATININE 1.09 07/08/2023   EGFR 75 07/08/2023   CALCIUM 10.1 07/08/2023   PHOS 3.6 10/26/2022   PROT 7.1 07/08/2023   ALBUMIN 4.6 07/08/2023   LABGLOB 2.5 07/08/2023   AGRATIO 2.1 02/11/2023   BILITOT 0.4 07/08/2023   ALKPHOS 161 (H) 07/08/2023   AST 19 07/08/2023   ALT 28 07/08/2023   ANIONGAP 9 10/26/2022   Last lipids Lab Results  Component Value Date   CHOL 148 01/05/2023   HDL 37 (L)  01/05/2023   LDLCALC 72 01/05/2023   TRIG 236 (H) 01/05/2023   CHOLHDL 4.0 01/05/2023   Last hemoglobin A1c Lab Results  Component Value Date   HGBA1C 14.3 (H) 07/08/2023   Last thyroid functions Lab Results  Component Value Date   TSH 1.530 06/06/2022   Last vitamin B12 and Folate Lab Results  Component Value Date   VITAMINB12 632 06/06/2022   FOLATE 3.5 06/06/2022   The 10-year ASCVD risk score (Arnett DK, et al., 2019) is: 44.2%    Assessment & Plan:   Problem List Items Addressed This Visit       Type 2 diabetes mellitus with hyperglycemia (HCC) - Primary    Return to care today for follow-up of diabetes mellitus.  Recent diagnosis, meeting criteria with undetectably high POC BGL last month.  A1c 14.3.  Metformin XR was increased to 1000 mg twice daily.  Lantus and Ozempic were both started.  His wife has been checking his blood sugar twice daily.  Overall readings are trending down, but he has multiple readings above 200 this week.  His first injection of Ozempic was 2 days ago (12/3).  His Lantus dosing has fluctuated, increasing up to 40 units but taking 28 units most recently.  He states that higher doses of Lantus caused him to feel clammy overnight. -Continue metformin as currently prescribed.  We discussed taking Lantus 20 units nightly due to recent symptoms.  We will plan to increase Ozempic to 0.5 mg weekly after he completes 3 additional injections of 0.25 mg.  We will then plan to increase Ozempic to 1 mg weekly.  He will return to care in 3 months.      Return in about 3 months (around 11/04/2023).   Billie Lade, MD

## 2023-08-06 NOTE — Assessment & Plan Note (Signed)
Return to care today for follow-up of diabetes mellitus.  Recent diagnosis, meeting criteria with undetectably high POC BGL last month.  A1c 14.3.  Metformin XR was increased to 1000 mg twice daily.  Lantus and Ozempic were both started.  His wife has been checking his blood sugar twice daily.  Overall readings are trending down, but he has multiple readings above 200 this week.  His first injection of Ozempic was 2 days ago (12/3).  His Lantus dosing has fluctuated, increasing up to 40 units but taking 28 units most recently.  He states that higher doses of Lantus caused him to feel clammy overnight. -Continue metformin as currently prescribed.  We discussed taking Lantus 20 units nightly due to recent symptoms.  We will plan to increase Ozempic to 0.5 mg weekly after he completes 3 additional injections of 0.25 mg.  We will then plan to increase Ozempic to 1 mg weekly.  He will return to care in 3 months.

## 2023-08-10 ENCOUNTER — Ambulatory Visit: Payer: PPO | Admitting: Internal Medicine

## 2023-08-10 ENCOUNTER — Ambulatory Visit: Payer: PPO

## 2023-08-12 ENCOUNTER — Ambulatory Visit: Payer: PPO

## 2023-08-12 DIAGNOSIS — L501 Idiopathic urticaria: Secondary | ICD-10-CM

## 2023-08-13 ENCOUNTER — Ambulatory Visit: Payer: PPO | Admitting: Internal Medicine

## 2023-08-17 ENCOUNTER — Other Ambulatory Visit: Payer: Self-pay | Admitting: Internal Medicine

## 2023-08-17 DIAGNOSIS — E1165 Type 2 diabetes mellitus with hyperglycemia: Secondary | ICD-10-CM

## 2023-08-19 ENCOUNTER — Encounter (HOSPITAL_COMMUNITY): Payer: Self-pay

## 2023-08-19 NOTE — Therapy (Signed)
[facility] Outpatient Rehabilitation at [facility] [address] [place], [place], [address] Phone: [phone]   Fax:  [phone]  Patient Details  Name: Christopher [name] MRN: [id number] Date of Birth: 11-Jun-1956 Referring Provider:  No ref. provider found  Encounter Date: 08/19/2023 PHYSICAL THERAPY DISCHARGE SUMMARY  Visits from Start of Care: 4  Current functional level related to goals / functional outcomes: Unknown   Remaining deficits: Unknown   Education / Equipment: NA   Patient being discharged from PT services at this time.Patient goals were not met. Patient is being discharged due to not returning since the last visit.   Nelida Meuse, PT 08/19/2023, 2:43 PM  [facility] Outpatient Rehabilitation at [facility] [address] [place], [place], [address] Phone: [phone]   Fax:  [phone]

## 2023-08-24 ENCOUNTER — Other Ambulatory Visit: Payer: Self-pay | Admitting: Neurosurgery

## 2023-08-24 ENCOUNTER — Ambulatory Visit: Payer: PPO

## 2023-08-28 ENCOUNTER — Other Ambulatory Visit: Payer: Self-pay | Admitting: Internal Medicine

## 2023-08-31 ENCOUNTER — Other Ambulatory Visit: Payer: Self-pay

## 2023-08-31 ENCOUNTER — Encounter (HOSPITAL_COMMUNITY): Payer: Self-pay | Admitting: Neurosurgery

## 2023-08-31 NOTE — Anesthesia Preprocedure Evaluation (Signed)
Anesthesia Evaluation    Airway        Dental   Pulmonary former smoker          Cardiovascular      Neuro/Psych    GI/Hepatic   Endo/Other  diabetes    Renal/GU      Musculoskeletal   Abdominal   Peds  Hematology   Anesthesia Other Findings   Reproductive/Obstetrics                              Anesthesia Physical Anesthesia Plan  ASA:   Anesthesia Plan:    Post-op Pain Management:    Induction:   PONV Risk Score and Plan:   Airway Management Planned:   Additional Equipment:   Intra-op Plan:   Post-operative Plan:   Informed Consent:   Plan Discussed with:   Anesthesia Plan Comments: (PAT note by Antionette Poles, PA-C: Pt seen by cardiology 2018 for eval of intermittent CP and coronary calcifications seen on CT. Had cath 09/04/2016 showing diffuse mild nonobstructive disease, normal LV function EF 55-65%, normal right heart and LV filling pressures, normal cardiac output. Medical therapy recommended.  He was last seen by Dr. Jenene Slicker 09/26/2022 for preop evaluation.  Per note, "Patient has chronic stable DOE since 2018 and has not improved or worsened.  He reported having 1 episode of chest heaviness with SOB and denied any bilateral lower extremity swelling. EKG showed sinus bradycardia and an incomplete right bundle branch block. LHC and RHC from 2018 showed diffuse mild nonobstructive CAD, normal LV function, normal right heart and LV filling pressures and normal cardiac output. I do not think this chronic DOE is ischemic in etiology given the fact that he underwent LHC and RHC in 2018 when he had similar symptoms of DOE and found to have mild, nonobstructive CAD with normal filling pressures in the heart. He will need to follow-up with his PCP for COPD management. One episode of chest heaviness might be secondary to SOB but I do not think this is ischemic in etiology. Will also obtain  2D echocardiogram to rule out diastolic dysfunction but patient does not have to wait until echocardiogram is reported. For time being, he will take Lasix 20 mg daily as needed and assess for any improvement in symptoms. Otherwise, he is compensated on examination and has no loud murmur. He is at a moderate risk for any perioperative cardiac complications. No further cardiac testing like stress test is indicated prior to proceeding with the planned procedure." He did subsequently undergo C3-5 ACDF  10/13/22 without complication.   Follows with pulmonologist Dr. 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  He is a former smoker with 44-pack-year history.  PFT 01/13/23:  FEV1 2.00 (67%), FEV1% 65, TLC 5.94 (95%), RV 2.93 (139%), DLCO 72%. Maintained on Stiolto and PRN albuterol. Last seen 06/01/23, stable at that time, no changes to management.   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 Maintained on Allegra, Pepcid, hydroxyzine, Singulair, Claritin, Xolair.  IDDM2, uncontrolled. Previously known to be prediabetic with recent progression to frank diabetes. Seen by PCP 07/08/23. A1c at that time 14.3 with BG 601. His metformin was increased and he was stared on lantus and ozempic. He was seen in followup on 12/5. Per note, blood sugar trending down, but still readings over 200. He was continued on metformin and Ozempic. Lantus  was decreased to 20 units nightly due to symptoms of clamminess at night.  On preop phone call pt reported recent fasting BG 160-170.  LD Ozempic 08/25/23.   History of difficult intubation secondary to reduced neck mobility.    Will need DOS labs and eval.    EKG 09/26/2022: Sinus bradycardia.  Rate 57.  Incomplete right bundle branch block.   TTE 03/18/23: 1. Left ventricular ejection fraction, by estimation, is 60 to 65%. The  left ventricle has normal function. The left  ventricle has no regional  wall motion abnormalities. Left ventricular diastolic parameters are  consistent with Grade I diastolic  dysfunction (impaired relaxation).   2. Right ventricular systolic function is normal. The right ventricular  size is normal.   3. The mitral valve is normal in structure. No evidence of mitral valve  regurgitation. No evidence of mitral stenosis.   4. The aortic valve was not well visualized. Aortic valve regurgitation  is not visualized. No aortic stenosis is present.   5. Aortic dilatation noted. There is borderline dilatation of the aortic  root, measuring 38 mm.   6. The inferior vena cava is normal in size with greater than 50%  respiratory variability, suggesting right atrial pressure of 3 mmHg.   Cath 09/04/16:            The left ventricular systolic function is normal.            LV end diastolic pressure is normal.            The left ventricular ejection fraction is 55-65% by visual estimate.            LV end diastolic pressure is normal.   1. Diffuse mild nonobstructive disease 2. Normal LV function 3. Normal right heart and LV filling pressures 4. Normal cardiac output.    Plan: Medical therapy.  )         Anesthesia Quick Evaluation

## 2023-08-31 NOTE — Progress Notes (Addendum)
SDW CALL  Patient's wife was given pre-op instructions over the phone. The opportunity was given for the patient and wife to ask questions. No further questions asked. Patient's wife verbalized understanding of instructions given.   PCP - Dr. Christel Mormon Cardiologist - Dr. Jenene Slicker - last office visit - 09/26/22  PPM/ICD - denies Device Orders - n/a Rep Notified - n/a  Chest x-ray - denies EKG - 09/26/22 Stress Test - denies ECHO - 03/18/23 Cardiac Cath - 09/04/16  Sleep Study - OSA+ CPAP - wears CPAP at night but does not know settings  Fasting Blood Sugar - 160-170 Checks Blood Sugar __4___ times a day  Last dose of GLP1 agonist-  08/25/23 GLP1 instructions: patient instructed to not take next dose of Ozempic  Blood Thinner Instructions: n/a Aspirin Instructions:n/a  ERAS Protcol - clears until 0800 PRE-SURGERY Ensure or G2- n/a  COVID TEST- n/a   Anesthesia review: yes - difficult airway, A1C- 14.3  Patient denies shortness of breath, fever, cough and chest pain over the phone call   All instructions explained to the patient, with a verbal understanding of the material. Patient agrees to go over the instructions while at home for a better understanding.

## 2023-08-31 NOTE — Progress Notes (Signed)
Anesthesia Chart Review: Same day workup  Pt seen by cardiology 2018 for eval of intermittent CP and coronary calcifications seen on CT. Had cath 09/04/2016 showing diffuse mild nonobstructive disease, normal LV function EF 55-65%, normal right heart and LV filling pressures, normal cardiac output. Medical therapy recommended.  He was last seen by Dr. Jenene Slicker 09/26/2022 for preop evaluation.  Per note, "Patient has chronic stable DOE since 2018 and has not improved or worsened.  He reported having 1 episode of chest heaviness with SOB and denied any bilateral lower extremity swelling. EKG showed sinus bradycardia and an incomplete right bundle branch block. LHC and RHC from 2018 showed diffuse mild nonobstructive CAD, normal LV function, normal right heart and LV filling pressures and normal cardiac output. I do not think this chronic DOE is ischemic in etiology given the fact that he underwent LHC and RHC in 2018 when he had similar symptoms of DOE and found to have mild, nonobstructive CAD with normal filling pressures in the heart. He will need to follow-up with his PCP for COPD management. One episode of chest heaviness might be secondary to SOB but I do not think this is ischemic in etiology. Will also obtain 2D echocardiogram to rule out diastolic dysfunction but patient does not have to wait until echocardiogram is reported. For time being, he will take Lasix 20 mg daily as needed and assess for any improvement in symptoms. Otherwise, he is compensated on examination and has no loud murmur. He is at a moderate risk for any perioperative cardiac complications. No further cardiac testing like stress test is indicated prior to proceeding with the planned procedure." He did subsequently undergo C3-5 ACDF  10/13/22 without complication.   Follows with pulmonologist Dr. 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  He is a former smoker  with 44-pack-year history.  PFT 01/13/23:  FEV1 2.00 (67%), FEV1% 65, TLC 5.94 (95%), RV 2.93 (139%), DLCO 72%. Maintained on Stiolto and PRN albuterol. Last seen 06/01/23, stable at that time, no changes to management.   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 Maintained on Allegra, Pepcid, hydroxyzine, Singulair, Claritin, Xolair.  IDDM2, uncontrolled. Previously known to be prediabetic with recent progression to frank diabetes. Seen by PCP 07/08/23. A1c at that time 14.3 with BG 601. His metformin was increased and he was stared on lantus and ozempic. He was seen in followup on 12/5. Per note, blood sugar trending down, but still readings over 200. He was continued on metformin and Ozempic. Lantus was decreased to 20 units nightly due to symptoms of clamminess at night.  On preop phone call pt reported recent fasting BG 160-170.  LD Ozempic 08/25/23.   History of difficult intubation secondary to reduced neck mobility.    Will need DOS labs and eval.    EKG 09/26/2022: Sinus bradycardia.  Rate 57.  Incomplete right bundle branch block.   TTE 03/18/23: 1. Left ventricular ejection fraction, by estimation, is 60 to 65%. The  left ventricle has normal function. The left ventricle has no regional  wall motion abnormalities. Left ventricular diastolic parameters are  consistent with Grade I diastolic  dysfunction (impaired relaxation).   2. Right ventricular systolic function is normal. The right ventricular  size is normal.   3. The mitral valve is normal in structure. No evidence of mitral valve  regurgitation. No evidence of mitral stenosis.   4. The aortic valve was  not well visualized. Aortic valve regurgitation  is not visualized. No aortic stenosis is present.   5. Aortic dilatation noted. There is borderline dilatation of the aortic  root, measuring 38 mm.   6. The inferior vena cava is normal in size with greater than 50%  respiratory variability,  suggesting right atrial pressure of 3 mmHg.   Cath 09/04/16:            The left ventricular systolic function is normal.            LV end diastolic pressure is normal.            The left ventricular ejection fraction is 55-65% by visual estimate.            LV end diastolic pressure is normal.   1. Diffuse mild nonobstructive disease 2. Normal LV function 3. Normal right heart and LV filling pressures 4. Normal cardiac output.    Plan: Medical therapy.      [facility] Short Stay Center/Anesthesiology Phone [phone] 08/31/2023 12:05 PM

## 2023-09-01 ENCOUNTER — Ambulatory Visit: Payer: Self-pay | Admitting: Internal Medicine

## 2023-09-01 NOTE — Telephone Encounter (Signed)
 Tiffany called patient.

## 2023-09-01 NOTE — Telephone Encounter (Signed)
 Chief Complaint: diarrhea Symptoms: watery stools Frequency: 2 months Pertinent Negatives: Patient denies blood in stool, nausea or vomiting, fever Disposition: [] ED /[] Urgent Care (no appt availability in office) / [] Appointment(In office/virtual)/ []  [facility]/ [] Home Care/ [x] Refused Recommended Disposition /[]  Mobile Bus/ []  Follow-up with PCP Additional Notes: Patient's wife, Christopher [name], called stating that the patient is prescribed metformin  at two pills twice daily, which she has decreased to one pill twice daily because the patient has been having severe diarrhea x2 months. She reports patient is having surgery Friday and is wanting to know if they should stop metformin . Patient reports stool is watery and he experiences abdominal cramping with bowel movements. Wife states they have not reached out to PCP regarding diarrhea as they were told this was a normal side effect. Pt denies blood in stool, changes in appetite, signs of dehydration. Per protocol, pt to be evaluated within 4 hours- next available appt 09/08/23 @ 0800. Patient declined appt and requests call back from provider to discuss discontinuing metformin . Care advice reviewed, understanding verbalized. Alerting PCP for review.   Copied from CRM [id number]. Topic: Clinical - Pink Word Triage >> Sep 01, 2023  9:57 AM Christopher [name] ORN wrote: Pt scheduled Reason for Disposition  [1] SEVERE diarrhea (e.g., 7 or more times / day more than normal) AND [2] age > 60 years  Answer Assessment - Initial Assessment Questions 1. DIARRHEA SEVERITY: How bad is the diarrhea? How many more stools have you had in the past 24 hours than normal?    - NO DIARRHEA (SCALE 0)   - MILD (SCALE 1-3): Few loose or mushy BMs; increase of 1-3 stools over normal daily number of stools; mild increase in ostomy output.   -  MODERATE (SCALE 4-7): Increase of 4-6 stools daily over normal; moderate increase in ostomy output.   -  SEVERE (SCALE  8-10; OR WORST POSSIBLE): Increase of 7 or more stools daily over normal; moderate increase in ostomy output; incontinence.     5-6 times a day at least, eases up in the afternoon 2. ONSET: When did the diarrhea begin?      2 months 3. BM CONSISTENCY: How loose or watery is the diarrhea?      Light brown, like water  4. VOMITING: Are you also vomiting? If Yes, ask: How many times in the past 24 hours?      Denies 5. ABDOMEN PAIN: Are you having any abdomen pain? If Yes, ask: What does it feel like? (e.g., crampy, dull, intermittent, constant)      Yes, cramping with diarrhea 6. ABDOMEN PAIN SEVERITY: If present, ask: How bad is the pain?  (e.g., Scale 1-10; mild, moderate, or severe)   - MILD (1-3): doesn't interfere with normal activities, abdomen soft and not tender to touch    - MODERATE (4-7): interferes with normal activities or awakens from sleep, abdomen tender to touch    - SEVERE (8-10): excruciating pain, doubled over, unable to do any normal activities       5/10 7. ORAL INTAKE: If vomiting, Have you been able to drink liquids? How much liquids have you had in the past 24 hours?     Normal appetite 8. HYDRATION: Any signs of dehydration? (e.g., dry mouth [not just dry lips], too weak to stand, dizziness, new weight loss) When did you last urinate?     denies 9. EXPOSURE: Have you traveled to a foreign country recently? Have you been exposed to anyone with diarrhea? Could  you have eaten any food that was spoiled?     denies 10. ANTIBIOTIC USE: Are you taking antibiotics now or have you taken antibiotics in the past 2 months?       Denies 11. OTHER SYMPTOMS: Do you have any other symptoms? (e.g., fever, blood in stool)       denies  Protocols used: Diarrhea-A-AH

## 2023-09-03 ENCOUNTER — Other Ambulatory Visit: Payer: Self-pay

## 2023-09-03 ENCOUNTER — Other Ambulatory Visit: Payer: Self-pay | Admitting: Neurosurgery

## 2023-09-03 DIAGNOSIS — E1165 Type 2 diabetes mellitus with hyperglycemia: Secondary | ICD-10-CM

## 2023-09-03 MED ORDER — LANTUS SOLOSTAR 100 UNIT/ML ~~LOC~~ SOPN: 10.0000 [IU] | PEN_INJECTOR | Freq: Every day | SUBCUTANEOUS | 2 refills | Status: DC

## 2023-09-04 ENCOUNTER — Inpatient Hospital Stay (HOSPITAL_COMMUNITY)
Admission: RE | Admit: 2023-09-04 | Discharge: 2023-09-05 | DRG: 473 | Disposition: A | Discharge status: Home / Self Care | Payer: PPO | Attending: Neurosurgery | Admitting: Neurosurgery

## 2023-09-04 ENCOUNTER — Other Ambulatory Visit: Payer: Self-pay

## 2023-09-04 ENCOUNTER — Encounter (HOSPITAL_COMMUNITY)
Admission: RE | Disposition: A | Discharge status: Home / Self Care | Payer: Self-pay | Source: Home / Self Care | Attending: Neurosurgery

## 2023-09-04 ENCOUNTER — Inpatient Hospital Stay (HOSPITAL_COMMUNITY): Payer: PPO

## 2023-09-04 ENCOUNTER — Inpatient Hospital Stay (HOSPITAL_COMMUNITY): Payer: PPO | Admitting: Physician Assistant

## 2023-09-04 ENCOUNTER — Encounter (HOSPITAL_COMMUNITY): Payer: Self-pay | Admitting: Neurosurgery

## 2023-09-04 ENCOUNTER — Telehealth: Payer: Self-pay

## 2023-09-04 DIAGNOSIS — F419 Anxiety disorder, unspecified: Secondary | ICD-10-CM | POA: Diagnosis present

## 2023-09-04 DIAGNOSIS — Z791 Long term (current) use of non-steroidal anti-inflammatories (NSAID): Secondary | ICD-10-CM | POA: Diagnosis not present

## 2023-09-04 DIAGNOSIS — Z79899 Other long term (current) drug therapy: Secondary | ICD-10-CM | POA: Diagnosis not present

## 2023-09-04 DIAGNOSIS — E785 Hyperlipidemia, unspecified: Secondary | ICD-10-CM | POA: Diagnosis present

## 2023-09-04 DIAGNOSIS — Z794 Long term (current) use of insulin: Secondary | ICD-10-CM | POA: Diagnosis not present

## 2023-09-04 DIAGNOSIS — Z8616 Personal history of COVID-19: Secondary | ICD-10-CM

## 2023-09-04 DIAGNOSIS — Z87891 Personal history of nicotine dependence: Secondary | ICD-10-CM

## 2023-09-04 DIAGNOSIS — F32A Depression, unspecified: Secondary | ICD-10-CM | POA: Diagnosis present

## 2023-09-04 DIAGNOSIS — Z7985 Long-term (current) use of injectable non-insulin antidiabetic drugs: Secondary | ICD-10-CM

## 2023-09-04 DIAGNOSIS — G4730 Sleep apnea, unspecified: Secondary | ICD-10-CM | POA: Diagnosis present

## 2023-09-04 DIAGNOSIS — Z720 Tobacco use: Principal | ICD-10-CM

## 2023-09-04 DIAGNOSIS — N400 Enlarged prostate without lower urinary tract symptoms: Secondary | ICD-10-CM | POA: Diagnosis present

## 2023-09-04 DIAGNOSIS — Z7984 Long term (current) use of oral hypoglycemic drugs: Secondary | ICD-10-CM | POA: Diagnosis not present

## 2023-09-04 DIAGNOSIS — Z833 Family history of diabetes mellitus: Secondary | ICD-10-CM | POA: Diagnosis not present

## 2023-09-04 DIAGNOSIS — Z885 Allergy status to narcotic agent status: Secondary | ICD-10-CM | POA: Diagnosis not present

## 2023-09-04 DIAGNOSIS — Y838 Other surgical procedures as the cause of abnormal reaction of the patient, or of later complication, without mention of misadventure at the time of the procedure: Secondary | ICD-10-CM | POA: Diagnosis present

## 2023-09-04 DIAGNOSIS — J4489 Other specified chronic obstructive pulmonary disease: Secondary | ICD-10-CM | POA: Diagnosis present

## 2023-09-04 DIAGNOSIS — Z888 Allergy status to other drugs, medicaments and biological substances status: Secondary | ICD-10-CM | POA: Diagnosis not present

## 2023-09-04 DIAGNOSIS — E119 Type 2 diabetes mellitus without complications: Secondary | ICD-10-CM | POA: Diagnosis present

## 2023-09-04 DIAGNOSIS — Z820 Family history of epilepsy and other diseases of the nervous system: Secondary | ICD-10-CM

## 2023-09-04 DIAGNOSIS — M109 Gout, unspecified: Secondary | ICD-10-CM | POA: Diagnosis present

## 2023-09-04 DIAGNOSIS — M960 Pseudarthrosis after fusion or arthrodesis: Secondary | ICD-10-CM | POA: Diagnosis present

## 2023-09-04 DIAGNOSIS — Z96653 Presence of artificial knee joint, bilateral: Secondary | ICD-10-CM | POA: Diagnosis present

## 2023-09-04 DIAGNOSIS — J449 Chronic obstructive pulmonary disease, unspecified: Secondary | ICD-10-CM | POA: Diagnosis not present

## 2023-09-04 DIAGNOSIS — E1165 Type 2 diabetes mellitus with hyperglycemia: Secondary | ICD-10-CM

## 2023-09-04 DIAGNOSIS — S129XXA Fracture of neck, unspecified, initial encounter: Principal | ICD-10-CM | POA: Diagnosis present

## 2023-09-04 DIAGNOSIS — K219 Gastro-esophageal reflux disease without esophagitis: Secondary | ICD-10-CM | POA: Diagnosis present

## 2023-09-04 DIAGNOSIS — Z96612 Presence of left artificial shoulder joint: Secondary | ICD-10-CM | POA: Diagnosis present

## 2023-09-04 DIAGNOSIS — M542 Cervicalgia: Secondary | ICD-10-CM | POA: Diagnosis present

## 2023-09-04 HISTORY — PX: POSTERIOR CERVICAL FUSION/FORAMINOTOMY: SHX5038

## 2023-09-04 HISTORY — DX: Chronic obstructive pulmonary disease, unspecified: J44.9

## 2023-09-04 LAB — BASIC METABOLIC PANEL
Anion gap: 11 (ref 5–15)
BUN: 17 mg/dL (ref 8–23)
CO2: 20 mmol/L — ABNORMAL LOW (ref 22–32)
Calcium: 9.3 mg/dL (ref 8.9–10.3)
Chloride: 106 mmol/L (ref 98–111)
Creatinine, Ser: 0.95 mg/dL (ref 0.61–1.24)
GFR, Estimated: >60 mL/min (ref >60)
Glucose, Bld: 164 mg/dL — ABNORMAL HIGH (ref 70–99)
Potassium: 4.2 mmol/L (ref 3.5–5.1)
Sodium: 137 mmol/L (ref 135–145)

## 2023-09-04 LAB — CBC
HCT: 42.4 % (ref 39.0–52.0)
Hemoglobin: 14.2 g/dL (ref 13.0–17.0)
MCH: 27.8 pg (ref 26.0–34.0)
MCHC: 33.5 g/dL (ref 30.0–36.0)
MCV: 83.1 fL (ref 80.0–100.0)
Platelets: 252 10*3/uL (ref 150–400)
RBC: 5.1 MIL/uL (ref 4.22–5.81)
RDW: 14.6 % (ref 11.5–15.5)
WBC: 9.1 10*3/uL (ref 4.0–10.5)
nRBC: 0 % (ref 0.0–0.2)

## 2023-09-04 LAB — SURGICAL PCR SCREEN
MRSA, PCR: NEGATIVE
Staphylococcus aureus: NEGATIVE

## 2023-09-04 LAB — GLUCOSE, CAPILLARY
Glucose-Capillary: 160 mg/dL — ABNORMAL HIGH (ref 70–99)
Glucose-Capillary: 131 mg/dL — ABNORMAL HIGH (ref 70–99)
Glucose-Capillary: 152 mg/dL — ABNORMAL HIGH (ref 70–99)
Glucose-Capillary: 152 mg/dL — ABNORMAL HIGH (ref 70–99)
Glucose-Capillary: 199 mg/dL — ABNORMAL HIGH (ref 70–99)

## 2023-09-04 LAB — TYPE AND SCREEN
ABO/RH(D): B POS
Antibody Screen: NEGATIVE

## 2023-09-04 SURGERY — POSTERIOR CERVICAL FUSION/FORAMINOTOMY LEVEL 2
Anesthesia: General | Site: Spine Cervical

## 2023-09-04 MED ORDER — FENTANYL CITRATE (PF) 100 MCG/2ML IJ SOLN
25.0000 ug | INTRAMUSCULAR | Status: DC | PRN
  Administered 2023-09-04 (×4): 25 ug via INTRAVENOUS

## 2023-09-04 MED ORDER — PROPOFOL 500 MG/50ML IV EMUL
INTRAVENOUS | Status: DC | PRN
  Administered 2023-09-04: 20 ug/kg/min via INTRAVENOUS

## 2023-09-04 MED ORDER — PHENYLEPHRINE HCL-NACL 20-0.9 MG/250ML-% IV SOLN
INTRAVENOUS | Status: DC | PRN
  Administered 2023-09-04: 20 ug/min via INTRAVENOUS

## 2023-09-04 MED ORDER — BACITRACIN ZINC 500 UNIT/GM EX OINT
TOPICAL_OINTMENT | CUTANEOUS | Status: DC | PRN
  Administered 2023-09-04: 1 via TOPICAL

## 2023-09-04 MED ORDER — MONTELUKAST SODIUM 10 MG PO TABS
10.0000 mg | ORAL_TABLET | Freq: Every day | ORAL | Status: DC
  Administered 2023-09-04: 10 mg via ORAL
  Filled 2023-09-04: qty 1

## 2023-09-04 MED ORDER — THROMBIN 20000 UNITS EX SOLR
CUTANEOUS | Status: AC
  Filled 2023-09-04: qty 20000

## 2023-09-04 MED ORDER — ROSUVASTATIN CALCIUM 20 MG PO TABS
20.0000 mg | ORAL_TABLET | Freq: Every day | ORAL | Status: DC
Start: 2023-09-04 — End: 2023-09-05
  Administered 2023-09-04 – 2023-09-05 (×2): 20 mg via ORAL
  Filled 2023-09-04 (×2): qty 1

## 2023-09-04 MED ORDER — PHENYLEPHRINE 80 MCG/ML (10ML) SYRINGE FOR IV PUSH (FOR BLOOD PRESSURE SUPPORT)
PREFILLED_SYRINGE | INTRAVENOUS | Status: DC | PRN
  Administered 2023-09-04: 80 ug via INTRAVENOUS
  Administered 2023-09-04 (×2): 160 ug via INTRAVENOUS
  Administered 2023-09-04 (×5): 80 ug via INTRAVENOUS

## 2023-09-04 MED ORDER — PHENOL 1.4 % MT LIQD: 1.0000 | OROMUCOSAL | Status: DC | PRN

## 2023-09-04 MED ORDER — ACETAMINOPHEN 650 MG RE SUPP: 650.0000 mg | RECTAL | Status: DC | PRN

## 2023-09-04 MED ORDER — LIDOCAINE-EPINEPHRINE 1 %-1:100000 IJ SOLN
INTRAMUSCULAR | Status: DC | PRN
  Administered 2023-09-04: 10 mL

## 2023-09-04 MED ORDER — METFORMIN HCL ER 500 MG PO TB24
500.0000 mg | ORAL_TABLET | Freq: Two times a day (BID) | ORAL | Status: DC
  Administered 2023-09-04 – 2023-09-05 (×2): 500 mg via ORAL
  Filled 2023-09-04 (×2): qty 1

## 2023-09-04 MED ORDER — ALUM & MAG HYDROXIDE-SIMETH 200-200-20 MG/5ML PO SUSP: 30.0000 mL | Freq: Four times a day (QID) | ORAL | Status: DC | PRN

## 2023-09-04 MED ORDER — ARFORMOTEROL TARTRATE 15 MCG/2ML IN NEBU
15.0000 ug | INHALATION_SOLUTION | Freq: Two times a day (BID) | RESPIRATORY_TRACT | Status: DC
  Administered 2023-09-04: 15 ug via RESPIRATORY_TRACT
  Filled 2023-09-04 (×4): qty 2

## 2023-09-04 MED ORDER — FAMOTIDINE 20 MG PO TABS
40.0000 mg | ORAL_TABLET | Freq: Two times a day (BID) | ORAL | Status: DC
  Administered 2023-09-04 – 2023-09-05 (×2): 40 mg via ORAL
  Filled 2023-09-04 (×2): qty 2

## 2023-09-04 MED ORDER — LIDOCAINE 2% (20 MG/ML) 5 ML SYRINGE
INTRAMUSCULAR | Status: AC
  Filled 2023-09-04: qty 5

## 2023-09-04 MED ORDER — MIDAZOLAM HCL 2 MG/2ML IJ SOLN
INTRAMUSCULAR | Status: AC
Start: 2023-09-04 — End: ?
  Filled 2023-09-04: qty 2

## 2023-09-04 MED ORDER — ONDANSETRON HCL 4 MG/2ML IJ SOLN: 4.0000 mg | Freq: Once | INTRAMUSCULAR | Status: DC | PRN

## 2023-09-04 MED ORDER — PANTOPRAZOLE SODIUM 40 MG IV SOLR
40.0000 mg | Freq: Every day | INTRAVENOUS | Status: DC
  Administered 2023-09-04: 40 mg via INTRAVENOUS
  Filled 2023-09-04: qty 10

## 2023-09-04 MED ORDER — HYDROMORPHONE HCL 1 MG/ML IJ SOLN
0.5000 mg | INTRAMUSCULAR | Status: DC | PRN
  Administered 2023-09-04 (×3): 0.5 mg via INTRAVENOUS
  Filled 2023-09-04 (×3): qty 0.5

## 2023-09-04 MED ORDER — MELOXICAM 7.5 MG PO TABS
7.5000 mg | ORAL_TABLET | Freq: Two times a day (BID) | ORAL | Status: DC
  Administered 2023-09-04 – 2023-09-05 (×2): 7.5 mg via ORAL
  Filled 2023-09-04 (×3): qty 1

## 2023-09-04 MED ORDER — HYDROXYZINE HCL 10 MG PO TABS: 25.0000 mg | ORAL_TABLET | Freq: Four times a day (QID) | ORAL | Status: DC | PRN

## 2023-09-04 MED ORDER — 0.9 % SODIUM CHLORIDE (POUR BTL) OPTIME
TOPICAL | Status: DC | PRN
  Administered 2023-09-04: 1000 mL

## 2023-09-04 MED ORDER — INSULIN ASPART 100 UNIT/ML IJ SOLN
0.0000 [IU] | Freq: Three times a day (TID) | INTRAMUSCULAR | Status: DC
  Administered 2023-09-05: 2 [IU] via SUBCUTANEOUS

## 2023-09-04 MED ORDER — ROCURONIUM BROMIDE 10 MG/ML (PF) SYRINGE
PREFILLED_SYRINGE | INTRAVENOUS | Status: DC | PRN
  Administered 2023-09-04: 20 mg via INTRAVENOUS
  Administered 2023-09-04: 80 mg via INTRAVENOUS

## 2023-09-04 MED ORDER — OXYCODONE HCL 5 MG PO TABS
5.0000 mg | ORAL_TABLET | Freq: Once | ORAL | Status: DC | PRN
Start: 2023-09-04 — End: 2023-09-04

## 2023-09-04 MED ORDER — LIDOCAINE 2% (20 MG/ML) 5 ML SYRINGE
INTRAMUSCULAR | Status: DC | PRN
  Administered 2023-09-04: 100 mg via INTRAVENOUS

## 2023-09-04 MED ORDER — CEFAZOLIN SODIUM-DEXTROSE 2-4 GM/100ML-% IV SOLN
INTRAVENOUS | Status: AC
  Filled 2023-09-04: qty 100

## 2023-09-04 MED ORDER — UMECLIDINIUM BROMIDE 62.5 MCG/ACT IN AEPB
1.0000 | INHALATION_SPRAY | Freq: Every day | RESPIRATORY_TRACT | Status: DC
  Administered 2023-09-04 – 2023-09-05 (×2): 1 via RESPIRATORY_TRACT
  Filled 2023-09-04: qty 7

## 2023-09-04 MED ORDER — ALBUMIN HUMAN 5 % IV SOLN: INTRAVENOUS | Status: DC | PRN

## 2023-09-04 MED ORDER — ALBUTEROL SULFATE (2.5 MG/3ML) 0.083% IN NEBU: 3.0000 mL | INHALATION_SOLUTION | RESPIRATORY_TRACT | Status: DC | PRN

## 2023-09-04 MED ORDER — INSULIN ASPART 100 UNIT/ML IJ SOLN
0.0000 [IU] | INTRAMUSCULAR | Status: DC | PRN
  Administered 2023-09-04: 2 [IU] via SUBCUTANEOUS

## 2023-09-04 MED ORDER — LACTATED RINGERS IV SOLN: INTRAVENOUS | Status: DC

## 2023-09-04 MED ORDER — NITROGLYCERIN 0.4 MG SL SUBL: 0.4000 mg | SUBLINGUAL_TABLET | SUBLINGUAL | Status: DC | PRN

## 2023-09-04 MED ORDER — SENNA 8.6 MG PO TABS
1.0000 | ORAL_TABLET | Freq: Two times a day (BID) | ORAL | Status: DC | PRN
  Administered 2023-09-04: 8.6 mg via ORAL
  Filled 2023-09-04: qty 1

## 2023-09-04 MED ORDER — SODIUM CHLORIDE 0.9% FLUSH
3.0000 mL | Freq: Two times a day (BID) | INTRAVENOUS | Status: DC
  Administered 2023-09-04 (×2): 3 mL via INTRAVENOUS

## 2023-09-04 MED ORDER — LANTUS SOLOSTAR 100 UNIT/ML ~~LOC~~ SOPN
20.0000 [IU] | PEN_INJECTOR | Freq: Every day | SUBCUTANEOUS | 2 refills | Status: DC
Start: 2023-09-04 — End: 2023-09-07

## 2023-09-04 MED ORDER — SODIUM CHLORIDE 0.9 % IV SOLN
250.0000 mL | INTRAVENOUS | Status: DC
  Administered 2023-09-04: 250 mL via INTRAVENOUS

## 2023-09-04 MED ORDER — HYDROCODONE-ACETAMINOPHEN 5-325 MG PO TABS
2.0000 | ORAL_TABLET | ORAL | Status: DC | PRN
  Administered 2023-09-04: 2 via ORAL
  Filled 2023-09-04: qty 2

## 2023-09-04 MED ORDER — HYDROMORPHONE HCL 1 MG/ML IJ SOLN
INTRAMUSCULAR | Status: AC
  Filled 2023-09-04: qty 0.5

## 2023-09-04 MED ORDER — BUPIVACAINE HCL (PF) 0.25 % IJ SOLN
INTRAMUSCULAR | Status: AC
  Filled 2023-09-04: qty 30

## 2023-09-04 MED ORDER — PANTOPRAZOLE SODIUM 40 MG PO TBEC
40.0000 mg | DELAYED_RELEASE_TABLET | Freq: Every day | ORAL | Status: DC
  Administered 2023-09-04 – 2023-09-05 (×2): 40 mg via ORAL
  Filled 2023-09-04 (×2): qty 1

## 2023-09-04 MED ORDER — HYDROCODONE-ACETAMINOPHEN 5-325 MG PO TABS: 1.0000 | ORAL_TABLET | ORAL | Status: DC | PRN

## 2023-09-04 MED ORDER — ROCURONIUM BROMIDE 10 MG/ML (PF) SYRINGE
PREFILLED_SYRINGE | INTRAVENOUS | Status: AC
  Filled 2023-09-04: qty 10

## 2023-09-04 MED ORDER — INSULIN ASPART 100 UNIT/ML IJ SOLN
0.0000 [IU] | Freq: Three times a day (TID) | INTRAMUSCULAR | Status: DC
  Administered 2023-09-04: 3 [IU] via SUBCUTANEOUS

## 2023-09-04 MED ORDER — ONDANSETRON HCL 4 MG/2ML IJ SOLN
INTRAMUSCULAR | Status: AC
  Filled 2023-09-04: qty 2

## 2023-09-04 MED ORDER — OMALIZUMAB 150 MG ~~LOC~~ SOLR: 300.0000 mg | SUBCUTANEOUS | Status: DC

## 2023-09-04 MED ORDER — LORATADINE 10 MG PO TABS
10.0000 mg | ORAL_TABLET | Freq: Every day | ORAL | Status: DC
  Administered 2023-09-04 – 2023-09-05 (×2): 10 mg via ORAL
  Filled 2023-09-04 (×2): qty 1

## 2023-09-04 MED ORDER — EPINEPHRINE 0.3 MG/0.3ML IJ SOAJ
0.3000 mg | INTRAMUSCULAR | Status: DC | PRN
Start: 2023-09-04 — End: 2023-09-05

## 2023-09-04 MED ORDER — ACETAMINOPHEN 325 MG PO TABS: 650.0000 mg | ORAL_TABLET | ORAL | Status: DC | PRN

## 2023-09-04 MED ORDER — SEMAGLUTIDE(0.25 OR 0.5MG/DOS) 2 MG/1.5ML ~~LOC~~ SOPN
0.5000 mg | PEN_INJECTOR | SUBCUTANEOUS | Status: DC
Start: 2023-09-04 — End: 2023-09-04

## 2023-09-04 MED ORDER — DIPHENHYDRAMINE HCL 50 MG/ML IJ SOLN
INTRAMUSCULAR | Status: AC
Start: 2023-09-04 — End: ?
  Filled 2023-09-04: qty 1

## 2023-09-04 MED ORDER — ACETAMINOPHEN 10 MG/ML IV SOLN
1000.0000 mg | Freq: Once | INTRAVENOUS | Status: DC | PRN
Start: 2023-09-04 — End: 2023-09-04

## 2023-09-04 MED ORDER — INSULIN ASPART 100 UNIT/ML IJ SOLN
INTRAMUSCULAR | Status: AC
  Filled 2023-09-04: qty 1

## 2023-09-04 MED ORDER — MENTHOL 3 MG MT LOZG: 1.0000 | LOZENGE | OROMUCOSAL | Status: DC | PRN

## 2023-09-04 MED ORDER — SUCCINYLCHOLINE CHLORIDE 200 MG/10ML IV SOSY
PREFILLED_SYRINGE | INTRAVENOUS | Status: DC | PRN
  Administered 2023-09-04: 200 mg via INTRAVENOUS

## 2023-09-04 MED ORDER — CHLORHEXIDINE GLUCONATE 0.12 % MT SOLN
15.0000 mL | Freq: Once | OROMUCOSAL | Status: AC
Start: 2023-09-04 — End: 2023-09-04
  Administered 2023-09-04: 15 mL via OROMUCOSAL
  Filled 2023-09-04: qty 15

## 2023-09-04 MED ORDER — FENTANYL CITRATE (PF) 250 MCG/5ML IJ SOLN
INTRAMUSCULAR | Status: DC | PRN
  Administered 2023-09-04: 100 ug via INTRAVENOUS

## 2023-09-04 MED ORDER — BUPIVACAINE HCL (PF) 0.25 % IJ SOLN
INTRAMUSCULAR | Status: DC | PRN
  Administered 2023-09-04: 10 mL

## 2023-09-04 MED ORDER — ONDANSETRON HCL 4 MG/2ML IJ SOLN
INTRAMUSCULAR | Status: DC | PRN
  Administered 2023-09-04: 4 mg via INTRAVENOUS

## 2023-09-04 MED ORDER — OXYCODONE HCL 5 MG/5ML PO SOLN
5.0000 mg | Freq: Once | ORAL | Status: DC | PRN
Start: 2023-09-04 — End: 2023-09-04

## 2023-09-04 MED ORDER — BACITRACIN ZINC 500 UNIT/GM EX OINT
TOPICAL_OINTMENT | CUTANEOUS | Status: AC
  Filled 2023-09-04: qty 28.35

## 2023-09-04 MED ORDER — SODIUM CHLORIDE 0.9% FLUSH: 3.0000 mL | INTRAVENOUS | Status: DC | PRN

## 2023-09-04 MED ORDER — MIDAZOLAM HCL 2 MG/2ML IJ SOLN
INTRAMUSCULAR | Status: DC | PRN
  Administered 2023-09-04: 2 mg via INTRAVENOUS

## 2023-09-04 MED ORDER — ONDANSETRON HCL 4 MG/2ML IJ SOLN: 4.0000 mg | Freq: Four times a day (QID) | INTRAMUSCULAR | Status: DC | PRN

## 2023-09-04 MED ORDER — PROPOFOL 10 MG/ML IV BOLUS
INTRAVENOUS | Status: AC
  Filled 2023-09-04: qty 20

## 2023-09-04 MED ORDER — CEFAZOLIN SODIUM-DEXTROSE 2-3 GM-%(50ML) IV SOLR
INTRAVENOUS | Status: DC | PRN
  Administered 2023-09-04: 2 g via INTRAVENOUS

## 2023-09-04 MED ORDER — SCOPOLAMINE 1 MG/3DAYS TD PT72SCOPOLAMINE 1 MG/3DAYS
1.0000 | MEDICATED_PATCH | TRANSDERMAL | Status: DC
Start: 2023-09-04 — End: 2023-09-04
  Administered 2023-09-04: 1.5 mg via TRANSDERMAL
  Filled 2023-09-04: qty 1

## 2023-09-04 MED ORDER — MUSCLE RUB 10-15 % EX CREA
TOPICAL_CREAM | Freq: Every day | CUTANEOUS | Status: DC
  Filled 2023-09-04 (×2): qty 85

## 2023-09-04 MED ORDER — PHENYLEPHRINE 80 MCG/ML (10ML) SYRINGE FOR IV PUSH (FOR BLOOD PRESSURE SUPPORT)
PREFILLED_SYRINGE | INTRAVENOUS | Status: AC
  Filled 2023-09-04: qty 10

## 2023-09-04 MED ORDER — LIDOCAINE-EPINEPHRINE 1 %-1:100000 IJ SOLN
INTRAMUSCULAR | Status: AC
  Filled 2023-09-04: qty 1

## 2023-09-04 MED ORDER — PHENYLEPHRINE 80 MCG/ML (10ML) SYRINGE FOR IV PUSH (FOR BLOOD PRESSURE SUPPORT)
PREFILLED_SYRINGE | INTRAVENOUS | Status: AC
  Filled 2023-09-04: qty 20

## 2023-09-04 MED ORDER — THROMBIN 20000 UNITS EX SOLR: CUTANEOUS | Status: DC | PRN

## 2023-09-04 MED ORDER — CEFAZOLIN SODIUM-DEXTROSE 2-4 GM/100ML-% IV SOLN
2.0000 g | Freq: Three times a day (TID) | INTRAVENOUS | Status: AC
  Administered 2023-09-04 – 2023-09-05 (×2): 2 g via INTRAVENOUS
  Filled 2023-09-04 (×2): qty 100

## 2023-09-04 MED ORDER — ORAL CARE MOUTH RINSE: 15.0000 mL | Freq: Once | OROMUCOSAL | Status: AC

## 2023-09-04 MED ORDER — CYCLOBENZAPRINE HCL 10 MG PO TABS
10.0000 mg | ORAL_TABLET | Freq: Three times a day (TID) | ORAL | Status: DC | PRN
  Administered 2023-09-04 – 2023-09-05 (×2): 10 mg via ORAL
  Filled 2023-09-04 (×2): qty 1

## 2023-09-04 MED ORDER — PROPOFOL 10 MG/ML IV BOLUS
INTRAVENOUS | Status: DC | PRN
  Administered 2023-09-04: 150 mg via INTRAVENOUS

## 2023-09-04 MED ORDER — FENTANYL CITRATE (PF) 250 MCG/5ML IJ SOLN
INTRAMUSCULAR | Status: AC
  Filled 2023-09-04: qty 5

## 2023-09-04 MED ORDER — HYDROCODONE-ACETAMINOPHEN 7.5-325 MG PO TABS
2.0000 | ORAL_TABLET | ORAL | Status: DC | PRN
  Administered 2023-09-04 – 2023-09-05 (×2): 2 via ORAL
  Filled 2023-09-04 (×3): qty 2

## 2023-09-04 MED ORDER — DEXAMETHASONE SODIUM PHOSPHATE 10 MG/ML IJ SOLN
INTRAMUSCULAR | Status: AC
  Filled 2023-09-04: qty 2

## 2023-09-04 MED ORDER — ONDANSETRON HCL 4 MG PO TABS: 4.0000 mg | ORAL_TABLET | Freq: Four times a day (QID) | ORAL | Status: DC | PRN

## 2023-09-04 MED ORDER — DIPHENHYDRAMINE HCL 50 MG/ML IJ SOLN
INTRAMUSCULAR | Status: DC | PRN
  Administered 2023-09-04: 25 mg via INTRAVENOUS

## 2023-09-04 MED ORDER — FENTANYL CITRATE (PF) 100 MCG/2ML IJ SOLN
INTRAMUSCULAR | Status: AC
  Filled 2023-09-04: qty 2

## 2023-09-04 MED ORDER — SERTRALINE HCL 50 MG PO TABS
50.0000 mg | ORAL_TABLET | Freq: Every morning | ORAL | Status: DC
  Administered 2023-09-05: 50 mg via ORAL
  Filled 2023-09-04: qty 1

## 2023-09-04 MED ORDER — DOCUSATE SODIUM 100 MG PO CAPS
100.0000 mg | ORAL_CAPSULE | Freq: Two times a day (BID) | ORAL | Status: DC | PRN
  Administered 2023-09-04: 100 mg via ORAL
  Filled 2023-09-04: qty 1

## 2023-09-04 MED ORDER — GABAPENTIN 300 MG PO CAPS
300.0000 mg | ORAL_CAPSULE | Freq: Three times a day (TID) | ORAL | Status: DC
  Administered 2023-09-04 – 2023-09-05 (×3): 300 mg via ORAL
  Filled 2023-09-04 (×3): qty 1

## 2023-09-04 SURGICAL SUPPLY — 64 items
BAG COUNTER SPONGE SURGICOUNT (BAG) ×1 IMPLANT
BENZOIN TINCTURE PRP APPL 2/3 (GAUZE/BANDAGES/DRESSINGS) ×2 IMPLANT
BIT DRILL 3.5 SHORT (BIT) IMPLANT
BIT DRILL NEURO 2X3.1 SFT TUCH (MISCELLANEOUS) IMPLANT
BLADE CLIPPER SURG (BLADE) ×1 IMPLANT
BLADE SURG 11 STRL SS (BLADE) ×1 IMPLANT
BONE VIVIGEN FORMABLE 10CC (Bone Implant) ×1 IMPLANT
BUR MATCHSTICK NEURO 3.0 LAGG (BURR) ×1 IMPLANT
CANISTER SUCT 3000ML PPV (MISCELLANEOUS) ×1 IMPLANT
CAP LOCKING (Cap) IMPLANT
DERMABOND ADVANCED .7 DNX12 (GAUZE/BANDAGES/DRESSINGS) ×1 IMPLANT
DRAPE C-ARM 42X72 X-RAY (DRAPES) IMPLANT
DRAPE LAPAROTOMY 100X72 PEDS (DRAPES) ×1 IMPLANT
DRAPE MICROSCOPE SLANT 54X150 (MISCELLANEOUS) IMPLANT
DRAPE SURG 17X23 STRL (DRAPES) ×4 IMPLANT
DRILL NEURO 2X3.1 SOFT TOUCH (MISCELLANEOUS) ×1
DRSG OPSITE 4X5.5 SM (GAUZE/BANDAGES/DRESSINGS) IMPLANT
DRSG OPSITE POSTOP 3X4 (GAUZE/BANDAGES/DRESSINGS) IMPLANT
DURAPREP 26ML APPLICATOR (WOUND CARE) ×1 IMPLANT
ELECT BLADE 4.0 EZ CLEAN MEGAD (MISCELLANEOUS) ×1
ELECT REM PT RETURN 9FT ADLT (ELECTROSURGICAL) ×1
ELECTRODE BLDE 4.0 EZ CLN MEGD (MISCELLANEOUS) IMPLANT
ELECTRODE REM PT RTRN 9FT ADLT (ELECTROSURGICAL) ×1 IMPLANT
EVACUATOR 1/8 PVC DRAIN (DRAIN) IMPLANT
GAUZE 4X4 16PLY ~~LOC~~+RFID DBL (SPONGE) IMPLANT
GAUZE SPONGE 4X4 12PLY STRL (GAUZE/BANDAGES/DRESSINGS) ×1 IMPLANT
GLOVE BIO SURGEON STRL SZ7 (GLOVE) IMPLANT
GLOVE BIO SURGEON STRL SZ8 (GLOVE) ×1 IMPLANT
GLOVE BIOGEL PI IND STRL 7.0 (GLOVE) IMPLANT
GLOVE EXAM NITRILE XL STR (GLOVE) IMPLANT
GLOVE INDICATOR 8.5 STRL (GLOVE) ×2 IMPLANT
GOWN STRL REUS W/ TWL LRG LVL3 (GOWN DISPOSABLE) IMPLANT
GOWN STRL REUS W/ TWL XL LVL3 (GOWN DISPOSABLE) ×1 IMPLANT
GOWN STRL REUS W/TWL 2XL LVL3 (GOWN DISPOSABLE) IMPLANT
GRAFT BNE MATRIX VG FRMBL L 10 (Bone Implant) IMPLANT
IMPL QUARTEX 3.5X12MM (Neuro Prosthesis/Implant) IMPLANT
IMPLANT QUARTEX 3.5X12MM (Neuro Prosthesis/Implant) ×6 IMPLANT
KIT BASIN OR (CUSTOM PROCEDURE TRAY) ×1 IMPLANT
KIT INFUSE XX SMALL 0.7CC (Orthopedic Implant) IMPLANT
KIT TURNOVER KIT B (KITS) ×1 IMPLANT
LOCKING CAP (Cap) ×6 IMPLANT
MARKER SKIN DUAL TIP RULER LAB (MISCELLANEOUS) ×1 IMPLANT
NDL HYPO 25X1 1.5 SAFETY (NEEDLE) ×1 IMPLANT
NDL SPNL 20GX3.5 QUINCKE YW (NEEDLE) ×1 IMPLANT
NEEDLE HYPO 25X1 1.5 SAFETY (NEEDLE) ×1
NEEDLE SPNL 20GX3.5 QUINCKE YW (NEEDLE) ×1
NS IRRIG 1000ML POUR BTL (IV SOLUTION) ×1 IMPLANT
PACK LAMINECTOMY NEURO (CUSTOM PROCEDURE TRAY) ×1 IMPLANT
PAD ARMBOARD 7.5X6 YLW CONV (MISCELLANEOUS) ×3 IMPLANT
PIN MAYFIELD SKULL DISP (PIN) ×1 IMPLANT
ROD CURVED CAP 3.5X45 (Rod) IMPLANT
ROD SPINAL CVD CAPITOL 3.5X40 (Rod) IMPLANT
SPIKE FLUID TRANSFER (MISCELLANEOUS) ×1 IMPLANT
SPONGE SURGIFOAM ABS GEL 100 (HEMOSTASIS) ×1 IMPLANT
SPONGE T-LAP 4X18 ~~LOC~~+RFID (SPONGE) IMPLANT
STRIP CLOSURE SKIN 1/2X4 (GAUZE/BANDAGES/DRESSINGS) ×1 IMPLANT
SUT ETHILON 4 0 PS 2 18 (SUTURE) IMPLANT
SUT VIC AB 0 CT1 18XCR BRD8 (SUTURE) ×1 IMPLANT
SUT VIC AB 2-0 CT1 18 (SUTURE) ×1 IMPLANT
SUT VIC AB 4-0 PS2 27 (SUTURE) ×1 IMPLANT
TOWEL GREEN STERILE (TOWEL DISPOSABLE) ×1 IMPLANT
TOWEL GREEN STERILE FF (TOWEL DISPOSABLE) ×1 IMPLANT
TRAY FOLEY MTR SLVR 16FR STAT (SET/KITS/TRAYS/PACK) IMPLANT
WATER STERILE IRR 1000ML POUR (IV SOLUTION) ×1 IMPLANT

## 2023-09-04 NOTE — Transfer of Care (Signed)
 Immediate Anesthesia Transfer of Care Note  Patient: Christopher [name]  Procedure(s) Performed: Posterior Cervical Fusion, Cervical Five-Cervical Four and Cervical Three-Cervical Three-Cervical Four (Spine Cervical)  Patient Location: PACU  Anesthesia Type:General  Level of Consciousness: awake, oriented, patient cooperative, and responds to stimulation  Airway & Oxygen Therapy: Patient Spontanous Breathing and Patient connected to face mask oxygen  Post-op Assessment: Report given to RN, Post -op Vital signs reviewed and stable, and Patient moving all extremities X 4  Post vital signs: Reviewed and stable  Last Vitals:  Vitals Value Taken Time  BP 148/69 09/04/23 1400  Temp 97.9   Pulse 65 09/04/23 1403  Resp 19 09/04/23 1403  SpO2 96 % 09/04/23 1403  Vitals shown include unfiled device data.  Last Pain:  Vitals:   09/04/23 0934  TempSrc: Oral         Complications:  Encounter Notable Events  Notable Event Outcome Phase Comment  Difficult to intubate - expected  Intraprocedure Filed from anesthesia note documentation.

## 2023-09-04 NOTE — Op Note (Signed)
 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  Postoperative diagnosis: Same.  Procedure: Posterior cervical fusion with lateral mass fixation utilizing the globus quartex lateral mass screw system and posterolateral arthrodesis C3-4 C4-5 utilizing Vivigen and BMP.  Surgeon: Arley helling.  Assistant: Gerldine Maizes.  Assistant to: Suzen Click.  Anesthesia: General.  EBL: 200.  HPI: 68 year old gentleman previously undergone ACDF at C3-4 and C4-5 at [facility] however last several months had progressive worsening neck pain workup revealed pseudoarthrosis at C3-4 and C4-5 and due to failure of bone stimulation conservative treatment and progressive worsening clinical syndrome recommended posterior cervical augmentation fusion with lateral mass fixation and posterolateral thesis.  I extensively reviewed the risks and benefits of the operation with patient as well as perioperative course expectations of outcome and alternatives to surgery and he understood and agreed to proceed forward.  Operative procedure: Patient was brought into the OR was induced under general anesthesia positioned prone head in pins in slight flexion backside of his neck was shaved prepped and draped in routine sterile fashion.  After infiltration of 10 cc lidocaine  with epi midline incision was made and Bovie electrocautery was used to take down the subcutaneous tissue and subperiosteal dissection was carried out on the lamina of C3-4 and C5 bilaterally confirmed by interoperative x-ray.  I exposed the lateral masses and then placed pilot holes in the inferomedial aspect of each lateral mass then drilled 12 mm holes bilaterally tap the neocortex at this point I aggressively decorticated the facet joints and PACs and Vivigen in the facet joints then placed the screws anchored in the all had excellent purchase anchored the rods in place and final tightened everything down and imaging showed the screws and implants  to be in good position I then laid some additional Vivigen and BMP along the lateral masses at C3-4 and C4-5.  Then placed a medium Hemovac drain injected Marcaine  in the fascia and closed the wound in layers with interrupted Vicryl and a running 4 subcuticular.  Dermabond benzoin Steri-Strips and a sterile dressing was applied patient recovery in stable condition.  At the end the case all needle count sponge counts were correct.

## 2023-09-04 NOTE — Telephone Encounter (Signed)
 Copied from CRM [id number]. Topic: Clinical - Prescription Issue >> Sep 03, 2023  4:16 PM Elle L wrote: Reason for CRM:  The patient's wife states that the insulin  glargine (LANTUS  SOLOSTAR) 100 UNIT/ML Solostar Pen needs written in the prescription that anything over 200 increase by 4 units as they are unable to refill it at this time. Their preferred pharmacy: [id number] - [place], [place] - [address] AT [facility] [address], [place] [address] Phone: [phone]  Fax: [phone]. Their call back number: [phone].

## 2023-09-04 NOTE — Anesthesia Postprocedure Evaluation (Signed)
 Anesthesia Post Note  Patient: Christopher [name]  Procedure(s) Performed: Posterior Cervical Fusion, Cervical Five-Cervical Four and Cervical Three-Cervical Three-Cervical Four (Spine Cervical)     Patient location during evaluation: PACU Anesthesia Type: General Level of consciousness: awake and alert Pain management: pain level controlled Vital Signs Assessment: post-procedure vital signs reviewed and stable Respiratory status: spontaneous breathing, nonlabored ventilation, respiratory function stable and patient connected to nasal cannula oxygen Cardiovascular status: blood pressure returned to baseline and stable Postop Assessment: no apparent nausea or vomiting Anesthetic complications: yes   Encounter Notable Events  Notable Event Outcome Phase Comment  Difficult to intubate - expected  Intraprocedure Filed from anesthesia note documentation.    Last Vitals:  Vitals:   09/04/23 1500 09/04/23 1522  BP: (!) 146/77 (!) 140/78  Pulse: 61 (!) 59  Resp: 16 18  Temp: 36.7 C 36.7 C  SpO2: 94% 95%    Last Pain:  Vitals:   09/04/23 1626  TempSrc:   PainSc: 7    Pain Goal: Patients Stated Pain Goal: 4 (09/04/23 1626)                 Lynwood MARLA Cornea

## 2023-09-04 NOTE — Anesthesia Procedure Notes (Addendum)
 Procedure Name: Intubation Date/Time: 09/04/2023 11:42 AM  Performed by: Joaopedro Eschbach J, CRNAPre-anesthesia Checklist: Patient identified, Emergency Drugs available, Suction available and Patient being monitored Patient Re-evaluated:Patient Re-evaluated prior to induction Oxygen Delivery Method: Circle System Utilized Preoxygenation: Pre-oxygenation with 100% oxygen Induction Type: IV induction and Rapid sequence Laryngoscope Size: Glidescope and 4 Grade View: Grade I Tube type: Oral Tube size: 8.0 mm Number of attempts: 1 Airway Equipment and Method: Stylet and Video-laryngoscopy Placement Confirmation: ETT inserted through vocal cords under direct vision, positive ETCO2 and breath sounds checked- equal and bilateral Secured at: 24 cm Tube secured with: Tape Dental Injury: Teeth and Oropharynx as per pre-operative assessment  Difficulty Due To: Difficulty was anticipated and Difficult Airway- due to reduced neck mobility Future Recommendations: Recommend- induction with short-acting agent, and alternative techniques readily available

## 2023-09-04 NOTE — H&P (Signed)
 Christopher [name] is an 68 y.o. male.   Chief Complaint: Neck pain HPI: 68 year old gentleman previously undergone a ACDF C3-C5 and did well initially however has had progressive worsening neck pain and bilateral shoulder pain over the last several weeks to months.  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  And due to his progression of clinical syndrome imaging findings of a conservative treatment I recommended posterior cervical fusion from C3-C5.  I extensively gone over the risks and benefits of the operation with him as well as perioperative course expectations of outcome and alternatives to surgery and he understood and agreed to proceed forward.  Past Medical History:  Diagnosis Date   Anxiety    Arthritis    Asthma    BPH (benign prostatic hyperplasia)    Complication of anesthesia    pt had a hard time being able to move after spinal anesthesia , 3-4 hours   COPD (chronic obstructive pulmonary disease) (HCC)    Depression    Diabetes mellitus without complication (HCC)    GERD (gastroesophageal reflux disease)    Gout    no meds   Headache(784.0)    otc meds prn   Heart murmur    dx as a child, no problems as an adult   History of COVID-19 2024   History of hiatal hernia    Hyperlipidemia    IBS (irritable bowel syndrome)    PONV (postoperative nausea and vomiting)    Sleep apnea    uses CIPAP machine at night   Wears partial dentures    bottom partial    Past Surgical History:  Procedure Laterality Date   ANTERIOR CERVICAL DECOMP/DISCECTOMY FUSION N/A 10/13/2022   Procedure: ANTERIOR CERVICAL DISECTOMY FUSION - CERVICAL THREE-CERVICAL FOUR - CERVICAL FOUR-CERVICAL FIVE REMOVAL OF HARDWARE CERVICAL FIVE-CERVICAL SIX;  Surgeon: [name], MD;  Location: [facility];  Service: Neurosurgery;  Laterality: N/A;   APPENDECTOMY     BACK SURGERY  2002   neck and back fusion   BIOPSY  12/27/2015   Procedure: BIOPSY;  Surgeon: Claudis RAYMOND Rivet,  MD;  Location: [facility];  Service: Endoscopy;;  Fundus biopsies and duodenal biopsies   BIOPSY  02/10/2020   Procedure: BIOPSY;  Surgeon: Rivet Claudis RAYMOND, MD;  [facility]: [facility];  Service: Endoscopy;;  antral   CARDIAC CATHETERIZATION     CARDIAC CATHETERIZATION N/A 09/04/2016   Procedure: Right/Left Heart Cath and Coronary Angiography;  Surgeon: Peter M Jordan, MD;  Location: [facility] INVASIVE CV LAB;  Service: Cardiovascular;  Laterality: N/A;   CHOLECYSTECTOMY     CHONDROPLASTY  08/15/2011   Procedure: CHONDROPLASTY;  Surgeon: Taft Minerva, MD;  Location: [facility] OR[facility];  Service: Orthopedics;  Laterality: Left;   COLONOSCOPY  06/27/2011   Procedure: COLONOSCOPY;  Surgeon: Claudis RAYMOND Rivet, MD;  [facility]: [facility];  Service: Endoscopy;  Laterality: N/A;  9:00 / Pt to be here at 9am for 10:45 procedure, benign polyps removed   COLONOSCOPY N/A 10/05/2014   Procedure: COLONOSCOPY;  Surgeon: Claudis RAYMOND Rivet, MD;  [facility];  Service: Endoscopy;  Laterality: N/A;  930   COLONOSCOPY N/A 02/11/2018   Procedure: COLONOSCOPY;  Surgeon: Rivet Claudis RAYMOND, MD;  [facility];  Service: Endoscopy;  Laterality: N/A;  830   ESOPHAGOGASTRODUODENOSCOPY N/A 12/27/2015   Procedure: ESOPHAGOGASTRODUODENOSCOPY (EGD);  Surgeon: Claudis RAYMOND Rivet, MD;  [facility];  Service: Endoscopy;  Laterality: N/A;  3:00   ESOPHAGOGASTRODUODENOSCOPY (EGD)  WITH PROPOFOL  N/A 02/10/2020   Procedure: ESOPHAGOGASTRODUODENOSCOPY (EGD) WITH PROPOFOL ;  Surgeon: Golda Claudis PENNER, MD;  [facility];  Service: Endoscopy;  Laterality: N/A;  155   HERNIA REPAIR  1998   umbilical hernia   JOINT REPLACEMENT  2023   knee and shoulder   KNEE ARTHROSCOPY     left knee   KNEE ARTHROSCOPY     right knee    LUMBAR LAMINECTOMY/DECOMPRESSION MICRODISCECTOMY  09/14/2012   Procedure: LUMBAR LAMINECTOMY/DECOMPRESSION MICRODISCECTOMY 1 LEVEL;  Surgeon: Catalina CHRISTELLA Stains, MD;  [facility]: [facility];  Service: Neurosurgery;  Laterality: Right;  Right Lumbar three-four Diskectomy   neck fusion  2005   NECK SURGERY     PATELLA-FEMORAL ARTHROPLASTY Right 02/18/2022   Procedure: RIGHT KNEE PATELLA REPLACEMENT, CEMENTED;  Surgeon: Addie Cordella Hamilton, MD;  Location: [facility];  Service: Orthopedics;  Laterality: Right;   POLYPECTOMY  02/11/2018   Procedure: POLYPECTOMY;  Surgeon: Golda Claudis PENNER, MD;  [facility];  Service: Endoscopy;;  colon   REVISION TOTAL SHOULDER TO REVERSE TOTAL SHOULDER Left 12/16/2018   Procedure: REVISION TOTAL SHOULDER TO REVERSE TOTAL SHOULDER;  Surgeon: Addie Cordella Hamilton, MD;  Location: [facility];  Service: Orthopedics;  Laterality: Left;   SHOULDER ARTHROSCOPY WITH BICEPSTENOTOMY Left 09/23/2013   Procedure: SHOULDER ARTHROSCOPY WITH BICEPSTENOTOMY AND EXTENSIVE DEBRIDEMENT;  Surgeon: Taft FORBES Minerva, MD;  Location: [facility];  Service: Orthopedics;  Laterality: Left;   SHOULDER ARTHROSCOPY WITH ROTATOR CUFF REPAIR Left 05/05/2014   Procedure: SHOULDER ARTHROSCOPY LIMITED DEBRIDEMENT;  Surgeon: Taft FORBES Minerva, MD;  Location: [facility];  Service: Orthopedics;  Laterality: Left;   SHOULDER OPEN ROTATOR CUFF REPAIR Left 05/05/2014   Procedure: ROTATOR CUFF REPAIR SHOULDER OPEN;  Surgeon: Taft FORBES Minerva, MD;  Location: [facility];  Service: Orthopedics;  Laterality: Left;   SHOULDER OPEN ROTATOR CUFF REPAIR Left 12/16/2018   Procedure: LEFT SHOULDER POSSIBLE SUBSCAPULARIS REPAIR VS. REVISION TO REVERSE TOTAL SHOULDER REPLACEMENT;  Surgeon: Addie Cordella Hamilton, MD;  Location: [facility];  Service: Orthopedics;  Laterality: Left;   SHOULDER SURGERY Right    Open Mumford procedure   SPINAL FUSION     x2, 2003 and 2007   TOTAL KNEE ARTHROPLASTY Left 06/16/2017   Procedure: LEFT TOTAL KNEE ARTHROPLASTY;  Surgeon: Minerva Taft FORBES, MD;  Location: [facility];  Service: Orthopedics;  Laterality: Left;   TOTAL KNEE ARTHROPLASTY Right 05/14/2021   Procedure: TOTAL KNEE  ARTHROPLASTY;  Surgeon: Minerva Taft FORBES, MD;  Location: [facility];  Service: Orthopedics;  Laterality: Right;   TOTAL SHOULDER ARTHROPLASTY Left 08/19/2018   Procedure: left shoulder replacement;  Surgeon: Addie Cordella Hamilton, MD;  Location: [facility] OR;  Service: Orthopedics;  Laterality: Left;    Family History  Problem Relation Age of Onset   Diabetes Mother    Alzheimer's disease Father    Diabetes Father    Diabetes Other    Lung disease Other    Arthritis Other    Anesthesia problems Neg Hx    Hypotension Neg Hx    Malignant hyperthermia Neg Hx    Pseudochol deficiency Neg Hx    Sleep apnea Neg Hx    Social History:  reports that he quit smoking about a year ago. His smoking use included cigarettes. He started smoking about 45 years ago. He has a 44 pack-year smoking history. He has been exposed to tobacco smoke. He has never used smokeless tobacco. He reports that he does not drink alcohol and does not  use drugs.  Allergies:  Allergies  Allergen Reactions   Celebrex [Celecoxib] Itching and Swelling    All over   Codeine Nausea And Vomiting and Other (See Comments)    Extreme stomach pain. This includes anything with the derivative of codeine in it. (Does tolerate hydrocodone )   Cortisone Swelling    SWELLING REACTION UNSPECIFIED    Doxycycline Swelling and Other (See Comments)    Made tongue turn black    Medrol  [Methylprednisolone ] Hives and Swelling    Angioedema    Prednisone  Swelling    SWELLING REACTION UNSPECIFIED    Relafen [Nabumetone] Swelling    SWELLING REACTION UNSPECIFIED    Aspirin  Other (See Comments)    Stomach cramps   Oxycodone -Acetaminophen  Itching and Other (See Comments)    Can tolerate with benadryl     Raloxifene Rash   Rofecoxib Rash    Facility-Administered Medications Prior to Admission  Medication Dose Route Frequency Provider Last Rate Last Admin   omalizumab  (XOLAIR ) injection 300 mg  300 mg Subcutaneous Q14 Days Ambs, Arlean HERO, FNP   300  mg at 08/12/23 1439   Medications Prior to Admission  Medication Sig Dispense Refill   albuterol  (VENTOLIN  HFA) 108 (90 Base) MCG/ACT inhaler inhale 2 puffs into lungs every 6 hours as needed for wheezing or shortness of breath 6.7 g 0   cyclobenzaprine  (FLEXERIL ) 10 MG tablet Take 1 tablet (10 mg total) by mouth 3 (three) times daily as needed for muscle spasms. Do not take with Robaxin  30 tablet 1   EPINEPHrine  0.3 mg/0.3 mL IJ SOAJ injection Inject 0.3 mg into the muscle as needed for anaphylaxis. 1 each 2   famotidine  (PEPCID ) 40 MG tablet Take 1 tablet (40 mg total) by mouth 2 (two) times daily. 60 tablet 5   fexofenadine  (ALLEGRA  ALLERGY ) 180 MG tablet Take 2 tablets (360 mg total) by mouth in the morning and at bedtime. 120 tablet 5   gabapentin  (NEURONTIN ) 300 MG capsule Take 300 mg by mouth 3 (three) times daily.     HYDROcodone -acetaminophen  (NORCO/VICODIN) 5-325 MG tablet Take 1 tablet by mouth every 4 (four) hours as needed for moderate pain. 20 tablet 0   hydrOXYzine  (VISTARIL ) 25 MG capsule take 1 capsule (25 MILLIGRAM total) by mouth at bedtime as needed for itching (hives). 30 capsule 2   insulin  glargine (LANTUS  SOLOSTAR) 100 UNIT/ML Solostar Pen Inject 10 Units into the skin at bedtime. 15 mL 2   Menthol , Topical Analgesic, (FREEZE IT FAST PAIN RELIEF EX) Apply 1 Application topically daily. 10%     metFORMIN  (GLUCOPHAGE -XR) 500 MG 24 hr tablet Take 2 tablets (1,000 mg total) by mouth 2 (two) times daily with a meal. (Patient taking differently: Take 500 mg by mouth 2 (two) times daily with a meal.) 360 tablet 1   nitroGLYCERIN  (NITROSTAT ) 0.4 MG SL tablet PLACE (1) TABLET UNDER TONGUE EVERY 5 MINUTES UP TO (3) DOSES. IF NO RELIEF CALL 911. 25 tablet 3   Omalizumab  (XOLAIR  [place]) Inject 2 application  into the skin every 30 (thirty) days. Given at Dr Lawerance by injection     pantoprazole  (PROTONIX ) 40 MG tablet TAKE (1) TABLET TWICE A DAY BEFORE MEALS. (Patient taking differently: Take  40 mg by mouth daily.) 60 tablet 0   rosuvastatin  (CRESTOR ) 20 MG tablet take 1 tablet by mouth once daily. 30 tablet 0   Semaglutide ,0.25 or 0.5MG /DOS, (OZEMPIC , 0.25 OR 0.5 MG/DOSE,) 2 MG/1.5ML SOPN Inject 0.5 mg into the skin once a week. Takes  on Tuesdays     sertraline  (ZOLOFT ) 50 MG tablet Take 1 tablet (50 mg total) by mouth in the morning. 90 tablet 2   Testosterone  Undecanoate (JATENZO ) 237 MG CAPS Take 1 capsule (237 mg total) by mouth in the morning and at bedtime. (Patient taking differently: Take by mouth in the morning and at bedtime.) 60 capsule 5   Tiotropium Bromide-Olodaterol (STIOLTO RESPIMAT ) 2.5-2.5 MCG/ACT AERS Inhale 2 puffs into the lungs daily. 1 each 5   Blood Glucose Monitoring Suppl DEVI 1 each by Does not apply route in the morning, at noon, and at bedtime. May substitute to any manufacturer covered by patient's insurance. 1 each 0   glucose blood (ACCU-CHEK GUIDE TEST) test strip 1 each by in vitro route in the morning, at noon, and at bedtime. 100 strip 0   Lancets (ONETOUCH DELICA PLUS LANCET33G) MISC 1 each by does not apply route in the morning, at noon, and at bedtime. 100 each 0   meloxicam  (MOBIC ) 7.5 MG tablet Take 7.5 mg by mouth 2 (two) times daily.     montelukast  (SINGULAIR ) 10 MG tablet Take 1 tablet (10 mg total) by mouth at bedtime. 30 tablet 5   ondansetron  (ZOFRAN ) 4 MG tablet Take 1 tablet (4 mg total) by mouth every 8 (eight) hours as needed for nausea. 20 tablet 0    Results for orders placed or performed during the hospital encounter of 09/04/23 (from the past 48 hours)  Type and screen [facility]     Status: None (Preliminary result)   Collection Time: 09/04/23  9:45 AM  Result Value Ref Range   ABO/RH(D) PENDING    Antibody Screen PENDING    Sample Expiration      09/07/2023,2359 Performed at [facility], [address] [place], [place] [address]    No results found.  Review of Systems  Blood pressure 127/64,  pulse 61, temperature 97.9 F (36.6 C), temperature source Oral, resp. rate 16, height 5' 6 (1.676 m), weight 104.3 kg, SpO2 97%. Physical Exam HENT:     Head: Normocephalic.     Right Ear: Tympanic membrane normal.     Nose: Nose normal.     Mouth/Throat:     Mouth: Mucous membranes are moist.  Eyes:     Pupils: Pupils are equal, round, and reactive to light.  Cardiovascular:     Rate and Rhythm: Normal rate.  Pulmonary:     Effort: Pulmonary effort is normal.  Abdominal:     General: Abdomen is flat.  Musculoskeletal:        General: Normal range of motion.     Cervical back: Normal range of motion.  Skin:    General: Skin is warm.  Neurological:     General: No focal deficit present.     Mental Status: He is alert.      Assessment/Plan 68 year old presents for posterior cervical fusion C3-4 C4-5  Arley SHAUNNA Helling, MD 09/04/2023, 10:31 AM

## 2023-09-04 NOTE — Plan of Care (Signed)
  Problem: Education: Goal: Knowledge of General Education information will improve Description: Including pain rating scale, medication(s)/side effects and non-pharmacologic comfort measures Outcome: Progressing   Problem: Health Behavior/Discharge Planning: Goal: Ability to manage health-related needs will improve Outcome: Progressing   Problem: Clinical Measurements: Goal: Ability to maintain clinical measurements within normal limits will improve Outcome: Progressing Goal: Will remain free from infection Outcome: Progressing Goal: Diagnostic test results will improve Outcome: Progressing Goal: Respiratory complications will improve Outcome: Progressing Goal: Cardiovascular complication will be avoided Outcome: Progressing   Problem: Activity: Goal: Risk for activity intolerance will decrease Outcome: Progressing   Problem: Nutrition: Goal: Adequate nutrition will be maintained Outcome: Progressing   Problem: Coping: Goal: Level of anxiety will decrease Outcome: Progressing   Problem: Elimination: Goal: Will not experience complications related to bowel motility Outcome: Progressing Goal: Will not experience complications related to urinary retention Outcome: Progressing   Problem: Pain Management: Goal: General experience of comfort will improve Outcome: Progressing   Problem: Safety: Goal: Ability to remain free from injury will improve Outcome: Progressing   Problem: Skin Integrity: Goal: Risk for impaired skin integrity will decrease Outcome: Progressing   Problem: Education: Goal: Ability to describe self-care measures that may prevent or decrease complications (Diabetes Survival Skills Education) will improve Outcome: Progressing Goal: Individualized Educational Video(s) Outcome: Progressing   Problem: Coping: Goal: Ability to adjust to condition or change in health will improve Outcome: Progressing   Problem: Fluid Volume: Goal: Ability to  maintain a balanced intake and output will improve Outcome: Progressing   Problem: Health Behavior/Discharge Planning: Goal: Ability to identify and utilize available resources and services will improve Outcome: Progressing Goal: Ability to manage health-related needs will improve Outcome: Progressing   Problem: Metabolic: Goal: Ability to maintain appropriate glucose levels will improve Outcome: Progressing   Problem: Nutritional: Goal: Maintenance of adequate nutrition will improve Outcome: Progressing Goal: Progress toward achieving an optimal weight will improve Outcome: Progressing   Problem: Skin Integrity: Goal: Risk for impaired skin integrity will decrease Outcome: Progressing   Problem: Tissue Perfusion: Goal: Adequacy of tissue perfusion will improve Outcome: Progressing   Problem: Education: Goal: Ability to verbalize activity precautions or restrictions will improve Outcome: Progressing Goal: Knowledge of the prescribed therapeutic regimen will improve Outcome: Progressing Goal: Understanding of discharge needs will improve Outcome: Progressing   Problem: Activity: Goal: Ability to avoid complications of mobility impairment will improve Outcome: Progressing Goal: Ability to tolerate increased activity will improve Outcome: Progressing Goal: Will remain free from falls Outcome: Progressing   Problem: Bowel/Gastric: Goal: Gastrointestinal status for postoperative course will improve Outcome: Progressing   Problem: Clinical Measurements: Goal: Ability to maintain clinical measurements within normal limits will improve Outcome: Progressing Goal: Postoperative complications will be avoided or minimized Outcome: Progressing Goal: Diagnostic test results will improve Outcome: Progressing   Problem: Pain Management: Goal: Pain level will decrease Outcome: Progressing   Problem: Skin Integrity: Goal: Will show signs of wound healing Outcome:  Progressing   Problem: Health Behavior/Discharge Planning: Goal: Identification of resources available to assist in meeting health care needs will improve Outcome: Progressing   Problem: Bladder/Genitourinary: Goal: Urinary functional status for postoperative course will improve Outcome: Progressing

## 2023-09-05 LAB — GLUCOSE, CAPILLARY: Glucose-Capillary: 144 mg/dL — ABNORMAL HIGH (ref 70–99)

## 2023-09-05 MED ORDER — HYDROCODONE-ACETAMINOPHEN 7.5-325 MG PO TABS: 1.0000 | ORAL_TABLET | ORAL | 0 refills | Status: DC | PRN

## 2023-09-05 MED ORDER — CYCLOBENZAPRINE HCL 10 MG PO TABS: 10.0000 mg | ORAL_TABLET | Freq: Three times a day (TID) | ORAL | 3 refills | Status: AC | PRN

## 2023-09-05 MED ORDER — HYDROCODONE-ACETAMINOPHEN 7.5-325 MG PO TABS
1.0000 | ORAL_TABLET | ORAL | Status: DC | PRN
  Administered 2023-09-05 (×2): 1 via ORAL
  Filled 2023-09-05: qty 1

## 2023-09-05 NOTE — Evaluation (Signed)
 Occupational Therapy Evaluation and DC Summary  Patient Details Name: Christopher [name] MRN: [id number] DOB: 1956/02/20 Today's Date: 09/05/2023   History of Present Illness Pt is a 68 y.o. M who presents to [facility] on 09/04/23 with cervical pseudoarthrosis C3-C5. Pt now s/p posterior cervical fusion and posterolateral arthrodesis C3-C4 and C4-C5. Significant PMH: depression, IBS, bilateral TKA, R patella replacement.   Clinical Impression   Pt admitted for above, educated him on c-spine precautions and compensatory strategies to complete mobility and ADLs (listed below). Pt does need cues to maintain precautions and break habits, overall able to complete ADLs with setup and has assist at home if needed. 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 Reinforced all strategies needed and precautions as much as possible throughout session, pt has no further acute skilled OT needs. No post acute OT needed.       If plan is discharge home, recommend the following: Assistance with cooking/housework;Assist for transportation    Functional Status Assessment  Patient has had a recent decline in their functional status and demonstrates the ability to make significant improvements in function in a reasonable and predictable amount of time.  Equipment Recommendations  None recommended by OT    Recommendations for Other Services       Precautions / Restrictions Precautions Precautions: Cervical Precaution Booklet Issued: Yes (comment) Precaution Comments: Reviewed C-spine precautions and strategies with pt Required Braces or Orthoses: Cervical Brace (orders for no brace, soft collar on pt upon arrival) Cervical Brace: Soft collar;For comfort Restrictions Weight Bearing Restrictions Per Provider Order: No      Mobility Bed Mobility Overal bed mobility: Modified Independent             General bed mobility  comments: Pt demonstrated log roll to get out of bed, left sitting EOB    Transfers Overall transfer level: Independent Equipment used: None               General transfer comment: STSx2 from EOB; VC needed when standing to keep chest up as pt has tendency to keep head down      Balance Overall balance assessment: Mild deficits observed, not formally tested                                         ADL either performed or assessed with clinical judgement   ADL Overall ADL's : Needs assistance/impaired Eating/Feeding: Independent;Sitting   Grooming: Standing;Supervision/safety Grooming Details (indicate cue type and reason): discussed sink strategies to bring everything to him instead of handing head in dependent position Upper Body Bathing: Standing;Supervision/ safety   Lower Body Bathing: Sitting/lateral leans;Set up;Supervison/ safety   Upper Body Dressing : Sitting;Modified independent Upper Body Dressing Details (indicate cue type and reason): donned button down shirt without challenge Lower Body Dressing: Sitting/lateral leans;Supervision/safety;Set up;Cueing for compensatory techniques Lower Body Dressing Details (indicate cue type and reason): Pt useing stool/chair to prop LEs on to don/doff socks, pt needing cues to keep neck neutral as he was hanging head down in dependent position thus breaking c spine precautions Toilet Transfer: Ambulation;Supervision/safety   Toileting- Clothing Manipulation and Hygiene: Supervision/safety;Sit to/from stand Toileting - Clothing Manipulation Details (indicate cue type and reason): pt demonstrated ability to wipe bottom while maintaining c spine precautions   Tub/Shower Transfer Details (indicate cue  type and reason): Reviewed using HH shower hose at home and avoiding bending at neck Functional mobility during ADLs: Supervision/safety General ADL Comments: discussed limiting lifing and using scooper to feed small  dog     Vision         Perception         Praxis         Pertinent Vitals/Pain Pain Assessment Pain Assessment: Faces Faces Pain Scale: Hurts little more Pain Location: neck incision Pain Descriptors / Indicators: Sore Pain Intervention(s): Monitored during session, RN gave pain meds during session     Extremity/Trunk Assessment Upper Extremity Assessment Upper Extremity Assessment: Overall WFL for tasks assessed   Lower Extremity Assessment Lower Extremity Assessment: Overall WFL for tasks assessed   Cervical / Trunk Assessment Cervical / Trunk Assessment: Neck Surgery   Communication Communication Communication: No apparent difficulties Cueing Techniques: Verbal cues;Visual cues   Cognition Arousal: Alert Behavior During Therapy: WFL for tasks assessed/performed Overall Cognitive Status: Impaired/Different from baseline Area of Impairment: Memory                     Memory: Decreased recall of precautions         General Comments: cues needed to maintain precautions, reinforeced handout for carryover     General Comments  VSS; educated pt to avoid riding his lawn mower and limit lifting    Exercises     Shoulder Instructions      Home Living Family/patient expects to be discharged to:: Private residence Living Arrangements: Spouse/significant other Available Help at Discharge: Family;Available 24 hours/day Type of Home: Mobile home Home Access: Ramped entrance     Home Layout: One level     Bathroom Shower/Tub: Producer, Television/film/video:  (low toilet)     Home Equipment: Agricultural Consultant (2 wheels);Crutches;Grab bars - tub/shower;Hand held shower head   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      Prior Functioning/Environment Prior Level of Function : Independent/Modified Independent;Working/employed;Driving             Mobility Comments: ind no AD ADLs Comments: Pt reports mod I        OT Problem  List: Pain;Obesity;Decreased knowledge of precautions      OT Treatment/Interventions:      OT Goals(Current goals can be found in the care plan section) Acute Rehab OT Goals Patient Stated Goal: to go home OT Goal Formulation: With patient Time For Goal Achievement: 09/19/23 Potential to Achieve Goals: Good  OT Frequency:      Co-evaluation              AM-PAC OT 6 Clicks Daily Activity     Outcome Measure Help from another person eating meals?: None Help from another person taking care of personal grooming?: A Little Help from another person toileting, which includes using toliet, bedpan, or urinal?: A Little Help from another person bathing (including washing, rinsing, drying)?: A Little Help from another person to put on and taking off regular upper body clothing?: A Little Help from another person to put on and taking off regular lower body clothing?: A Little 6 Click Score: 19   End of Session Equipment Utilized During Treatment: Gait belt;Cervical collar Nurse Communication: Mobility status  Activity Tolerance: Patient tolerated treatment well Patient left: in bed;with call bell/phone within reach  OT Visit Diagnosis: Pain Pain - Right/Left:  (neck)  Time: 9141-9080 OT Time Calculation (min): 21 min Charges:  OT General Charges $OT Visit: 1 Visit OT Evaluation $OT Eval Low Complexity: 1 Low  09/05/2023  AB, OTR/L  Acute Rehabilitation Services  Office: [phone]   Christopher [name] 09/05/2023, 9:44 AM

## 2023-09-05 NOTE — Discharge Instructions (Signed)
Wound Care Keep incision covered and dry until post op day 3. You may remove the Honeycomb dressing on post op day 3. Leave steri-strips on back.  They will fall off by themselves. Do not put any creams, lotions, or ointments on incision. You are fine to shower. Let water run over incision and pat dry.  Activity Walk each and every day, increasing distance each day. No lifting greater than 8 lbs.  No lifting no bending no twisting no driving or riding a car unless coming back and forth to see the doctor. If provided with back brace, wear when out of bed.  It is not necessary to wear brace in bed.  Diet Resume your normal diet.     Call Your Doctor If Any of These Occur Redness, drainage, or swelling at the wound.  Temperature greater than 101 degrees. Severe pain not relieved by pain medication. Incision starts to come apart.  Follow Up Appt Call [phone] if you have one or any problem.

## 2023-09-05 NOTE — Discharge Summary (Signed)
 Physician Discharge Summary  Patient ID: Christopher [name] MRN: [id number] DOB/AGE: 66/01/1956 66 y.o.  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  He tolerated surgery well  Consults: None  Significant Diagnostic Studies: None  Treatments: surgery: See op note  Discharge Exam: Blood pressure 116/80, pulse (!) 51, temperature 98.7 F (37.1 C), temperature source Oral, resp. rate 20, height 5' 6 (1.676 m), weight 104.3 kg, SpO2 96%. Incision is clean and dry Station and gait are intact.  Neurologic function is intact  Disposition: Discharge disposition: 01-Home or Self Care       Discharge Instructions     Ambulatory Referral for Lung Cancer Scre   Complete by: As directed    Call MD for:  redness, tenderness, or signs of infection (pain, swelling, redness, odor or green/yellow discharge around incision site)   Complete by: As directed    Call MD for:  severe uncontrolled pain   Complete by: As directed    Call MD for:  temperature >100.4   Complete by: As directed    Diet - low sodium heart healthy   Complete by: As directed    Increase activity slowly   Complete by: As directed       Allergies as of 09/05/2023       Reactions   Celebrex [celecoxib] Itching, Swelling   All over   Codeine Nausea And Vomiting, Other (See Comments)   Extreme stomach pain. This includes anything with the derivative of codeine in it. (Does tolerate hydrocodone )   Cortisone Swelling   SWELLING REACTION UNSPECIFIED    Doxycycline Swelling, Other (See Comments)   Made tongue turn black    Medrol  [methylprednisolone ] Hives, Swelling   Angioedema   Prednisone  Swelling   SWELLING  REACTION UNSPECIFIED    Relafen [nabumetone] Swelling   SWELLING REACTION UNSPECIFIED    Aspirin  Other (See Comments)   Stomach cramps   Oxycodone -acetaminophen  Itching, Other (See Comments)   Can tolerate with benadryl     Raloxifene Rash   Rofecoxib Rash        Medication List     STOP taking these medications    HYDROcodone -acetaminophen  5-325 MG tablet Commonly known as: NORCO/VICODIN Replaced by: HYDROcodone -acetaminophen  7.5-325 MG tablet       TAKE these medications    Accu-Chek Guide Test test strip Generic drug: glucose blood 1 each by in vitro route in the morning, at noon, and at bedtime.   albuterol  108 (90 Base) MCG/ACT inhaler Commonly known as: VENTOLIN  HFA inhale 2 puffs into lungs every 6 hours as needed for wheezing or shortness of breath   Blood Glucose Monitoring Suppl Devi 1 each by Does not apply route in the morning, at noon, and at bedtime. May substitute to any manufacturer covered by patient's insurance.   cyclobenzaprine  10 MG tablet Commonly known as: FLEXERIL  Take 1 tablet (10 mg total) by mouth 3 (three) times daily as needed for muscle spasms. What changed: additional instructions   EPINEPHrine  0.3 mg/0.3 mL Soaj injection Commonly known as: EPI-PEN Inject 0.3 mg into the muscle as needed for anaphylaxis.   famotidine  40 MG tablet Commonly known as: Pepcid  Take 1 tablet (40 mg total) by mouth 2 (two) times daily.   fexofenadine  180 MG tablet Commonly known  as: Allegra  Allergy  Take 2 tablets (360 mg total) by mouth in the morning and at bedtime.   FREEZE IT FAST PAIN RELIEF EX Apply 1 Application topically daily. 10%   gabapentin  300 MG capsule Commonly known as: NEURONTIN  Take 300 mg by mouth 3 (three) times daily.   HYDROcodone -acetaminophen  7.5-325 MG tablet Commonly known as: NORCO Take 1-2 tablets by mouth every 4 (four) hours as needed for severe pain (pain score 7-10) or moderate pain (pain score 4-6). Replaces:  HYDROcodone -acetaminophen  5-325 MG tablet   hydrOXYzine  25 MG capsule Commonly known as: VISTARIL  take 1 capsule (25 MILLIGRAM total) by mouth at bedtime as needed for itching (hives).   Jatenzo  237 MG Caps Generic drug: Testosterone  Undecanoate Take 1 capsule (237 mg total) by mouth in the morning and at bedtime. What changed: how much to take   Lantus  SoloStar 100 UNIT/ML Solostar Pen Generic drug: insulin  glargine Inject 20 Units into the skin at bedtime. What changed: how much to take   meloxicam  7.5 MG tablet Commonly known as: MOBIC  Take 7.5 mg by mouth 2 (two) times daily.   metFORMIN  500 MG 24 hr tablet Commonly known as: GLUCOPHAGE -XR Take 2 tablets (1,000 mg total) by mouth 2 (two) times daily with a meal. What changed: how much to take   montelukast  10 MG tablet Commonly known as: Singulair  Take 1 tablet (10 mg total) by mouth at bedtime.   nitroGLYCERIN  0.4 MG SL tablet Commonly known as: NITROSTAT  PLACE (1) TABLET UNDER TONGUE EVERY 5 MINUTES UP TO (3) DOSES. IF NO RELIEF CALL 911.   ondansetron  4 MG tablet Commonly known as: Zofran  Take 1 tablet (4 mg total) by mouth every 8 (eight) hours as needed for nausea.   OneTouch Delica Plus Lancet33G Misc 1 each by does not apply route in the morning, at noon, and at bedtime.   Ozempic  (0.25 or 0.5 MG/DOSE) 2 MG/1.5ML Sopn Generic drug: Semaglutide (0.25 or 0.5MG /DOS) Inject 0.5 mg into the skin once a week. Takes on Tuesdays   pantoprazole  40 MG tablet Commonly known as: PROTONIX  TAKE (1) TABLET TWICE A DAY BEFORE MEALS. What changed: See the new instructions.   rosuvastatin  20 MG tablet Commonly known as: CRESTOR  take 1 tablet by mouth once daily.   sertraline  50 MG tablet Commonly known as: ZOLOFT  Take 1 tablet (50 mg total) by mouth in the morning.   Stiolto Respimat  2.5-2.5 MCG/ACT Aers Generic drug: Tiotropium Bromide-Olodaterol Inhale 2 puffs into the lungs daily.   XOLAIR  [facility] Inject 2  application  into the skin every 30 (thirty) days. Given at Dr Lawerance by injection        Follow-up Information     [name], MD. Call.   Specialty: Neurosurgery Why: As needed, If symptoms worsen Contact information: [address] [place] [address] [phone]                 Signed: Victory JINNY Gens 09/05/2023, 7:55 AM

## 2023-09-05 NOTE — Care Management (Signed)
 Patient with order to DC to home today. Unit staff to provide DME needed for home.   No HH needs identified Patient will have family/ friends provide transportation home. No other TOC needs identified for DC

## 2023-09-05 NOTE — Progress Notes (Signed)
 Patient awaiting family for discharge home, Patient in no acute distress nor complaints of pain nor discomfort; incision on back of neck is clean, dry and intact; No c/o pain at this time. Room was checked and accounted for all patient's belongings; discharge instructions concerning her medications, incision care, follow up appointment and when to call the doctor as needed were all discussed with patient by RN and he expressed understanding on the instructions given.

## 2023-09-05 NOTE — Plan of Care (Signed)
 Problem: Education: Goal: Knowledge of General Education information will improve Description: Including pain rating scale, medication(s)/side effects and non-pharmacologic comfort measures 09/05/2023 0803 by Sherleen Flor, RN Outcome: Completed/Met 09/04/2023 1842 by Sherleen Flor, RN Outcome: Progressing   Problem: Health Behavior/Discharge Planning: Goal: Ability to manage health-related needs will improve 09/05/2023 0803 by Sherleen Flor, RN Outcome: Completed/Met 09/04/2023 1842 by Sherleen Flor, RN Outcome: Progressing   Problem: Clinical Measurements: Goal: Ability to maintain clinical measurements within normal limits will improve 09/05/2023 0803 by Sherleen Flor, RN Outcome: Completed/Met 09/04/2023 1842 by Sherleen Flor, RN Outcome: Progressing Goal: Will remain free from infection 09/05/2023 0803 by Sherleen Flor, RN Outcome: Completed/Met 09/04/2023 1842 by Sherleen Flor, RN Outcome: Progressing Goal: Diagnostic test results will improve 09/05/2023 0803 by Sherleen Flor, RN Outcome: Completed/Met 09/04/2023 1842 by Sherleen Flor, RN Outcome: Progressing Goal: Respiratory complications will improve 09/05/2023 0803 by Sherleen Flor, RN Outcome: Completed/Met 09/04/2023 1842 by Sherleen Flor, RN Outcome: Progressing Goal: Cardiovascular complication will be avoided 09/05/2023 0803 by Sherleen Flor, RN Outcome: Completed/Met 09/04/2023 1842 by Sherleen Flor, RN Outcome: Progressing   Problem: Activity: Goal: Risk for activity intolerance will decrease 09/05/2023 0803 by Sherleen Flor, RN Outcome: Completed/Met 09/04/2023 1842 by Sherleen Flor, RN Outcome: Progressing   Problem: Nutrition: Goal: Adequate nutrition will be maintained 09/05/2023 0803 by Sherleen Flor, RN Outcome: Completed/Met 09/04/2023 1842 by Sherleen Flor, RN Outcome: Progressing   Problem: Coping: Goal: Level of anxiety will decrease 09/05/2023 0803 by Sherleen Flor, RN Outcome: Completed/Met 09/04/2023 1842 by Sherleen Flor, RN Outcome: Progressing   Problem: Elimination: Goal: Will not experience complications related to bowel motility 09/05/2023 0803 by Sherleen Flor, RN Outcome: Completed/Met 09/04/2023 1842 by Sherleen Flor, RN Outcome: Progressing Goal: Will not experience complications related to urinary retention 09/05/2023 0803 by Sherleen Flor, RN Outcome: Completed/Met 09/04/2023 1842 by Sherleen Flor, RN Outcome: Progressing   Problem: Pain Management: Goal: General experience of comfort will improve 09/05/2023 0803 by Sherleen Flor, RN Outcome: Completed/Met 09/04/2023 1842 by Sherleen Flor, RN Outcome: Progressing   Problem: Safety: Goal: Ability to remain free from injury will improve 09/05/2023 0803 by Sherleen Flor, RN Outcome: Completed/Met 09/04/2023 1842 by Sherleen Flor, RN Outcome: Progressing   Problem: Skin Integrity: Goal: Risk for impaired skin integrity will decrease 09/05/2023 0803 by Sherleen Flor, RN Outcome: Completed/Met 09/04/2023 1842 by Sherleen Flor, RN Outcome: Progressing   Problem: Education: Goal: Ability to describe self-care measures that may prevent or decrease complications (Diabetes Survival Skills Education) will improve 09/05/2023 0803 by Sherleen Flor, RN Outcome: Completed/Met 09/04/2023 1842 by Sherleen Flor, RN Outcome: Progressing Goal: Individualized Educational Video(s) 09/05/2023 0803 by Sherleen Flor, RN Outcome: Completed/Met 09/04/2023 1842 by Sherleen Flor, RN Outcome: Progressing   Problem: Coping: Goal: Ability to adjust to condition or change in health will improve 09/05/2023 0803 by Sherleen Flor, RN Outcome: Completed/Met 09/04/2023 1842 by Sherleen Flor, RN Outcome: Progressing   Problem: Fluid Volume: Goal: Ability to maintain a balanced intake and output will improve 09/05/2023 0803 by Sherleen Flor, RN Outcome:  Completed/Met 09/04/2023 1842 by Sherleen Flor, RN Outcome: Progressing   Problem: Health Behavior/Discharge Planning: Goal: Ability to identify and utilize available resources and services will improve 09/05/2023 0803 by Sherleen Flor, RN Outcome: Completed/Met 09/04/2023 1842 by Sherleen Flor, RN Outcome: Progressing Goal: Ability to manage health-related needs will improve 09/05/2023 0803 by Sherleen Flor, RN Outcome: Completed/Met 09/04/2023 1842 by Sherleen Flor, RN Outcome: Progressing   Problem: Metabolic: Goal: Ability to maintain appropriate glucose levels will improve  09/05/2023 0803 by Sherleen Flor, RN Outcome: Completed/Met 09/04/2023 1842 by Sherleen Flor, RN Outcome: Progressing   Problem: Nutritional: Goal: Maintenance of adequate nutrition will improve 09/05/2023 0803 by Sherleen Flor, RN Outcome: Completed/Met 09/04/2023 1842 by Sherleen Flor, RN Outcome: Progressing Goal: Progress toward achieving an optimal weight will improve 09/05/2023 0803 by Sherleen Flor, RN Outcome: Completed/Met 09/04/2023 1842 by Sherleen Flor, RN Outcome: Progressing   Problem: Skin Integrity: Goal: Risk for impaired skin integrity will decrease 09/05/2023 0803 by Sherleen Flor, RN Outcome: Completed/Met 09/04/2023 1842 by Sherleen Flor, RN Outcome: Progressing   Problem: Tissue Perfusion: Goal: Adequacy of tissue perfusion will improve 09/05/2023 0803 by Sherleen Flor, RN Outcome: Completed/Met 09/04/2023 1842 by Sherleen Flor, RN Outcome: Progressing   Problem: Education: Goal: Ability to verbalize activity precautions or restrictions will improve 09/05/2023 0803 by Sherleen Flor, RN Outcome: Completed/Met 09/04/2023 1842 by Sherleen Flor, RN Outcome: Progressing Goal: Knowledge of the prescribed therapeutic regimen will improve 09/05/2023 0803 by Sherleen Flor, RN Outcome: Completed/Met 09/04/2023 1842 by Sherleen Flor, RN Outcome:  Progressing Goal: Understanding of discharge needs will improve 09/05/2023 0803 by Sherleen Flor, RN Outcome: Completed/Met 09/04/2023 1842 by Sherleen Flor, RN Outcome: Progressing   Problem: Activity: Goal: Ability to avoid complications of mobility impairment will improve 09/05/2023 0803 by Sherleen Flor, RN Outcome: Completed/Met 09/04/2023 1842 by Sherleen Flor, RN Outcome: Progressing Goal: Ability to tolerate increased activity will improve 09/05/2023 0803 by Sherleen Flor, RN Outcome: Completed/Met 09/04/2023 1842 by Sherleen Flor, RN Outcome: Progressing Goal: Will remain free from falls 09/05/2023 0803 by Sherleen Flor, RN Outcome: Completed/Met 09/04/2023 1842 by Sherleen Flor, RN Outcome: Progressing   Problem: Bowel/Gastric: Goal: Gastrointestinal status for postoperative course will improve 09/05/2023 0803 by Sherleen Flor, RN Outcome: Completed/Met 09/04/2023 1842 by Sherleen Flor, RN Outcome: Progressing   Problem: Clinical Measurements: Goal: Ability to maintain clinical measurements within normal limits will improve 09/05/2023 0803 by Sherleen Flor, RN Outcome: Completed/Met 09/04/2023 1842 by Sherleen Flor, RN Outcome: Progressing Goal: Postoperative complications will be avoided or minimized 09/05/2023 0803 by Sherleen Flor, RN Outcome: Completed/Met 09/04/2023 1842 by Sherleen Flor, RN Outcome: Progressing Goal: Diagnostic test results will improve 09/05/2023 0803 by Sherleen Flor, RN Outcome: Completed/Met 09/04/2023 1842 by Sherleen Flor, RN Outcome: Progressing   Problem: Pain Management: Goal: Pain level will decrease 09/05/2023 0803 by Sherleen Flor, RN Outcome: Completed/Met 09/04/2023 1842 by Sherleen Flor, RN Outcome: Progressing   Problem: Skin Integrity: Goal: Will show signs of wound healing 09/05/2023 0803 by Sherleen Flor, RN Outcome: Completed/Met 09/04/2023 1842 by Sherleen Flor, RN Outcome:  Progressing   Problem: Health Behavior/Discharge Planning: Goal: Identification of resources available to assist in meeting health care needs will improve 09/05/2023 0803 by Sherleen Flor, RN Outcome: Completed/Met 09/04/2023 1842 by Sherleen Flor, RN Outcome: Progressing   Problem: Bladder/Genitourinary: Goal: Urinary functional status for postoperative course will improve 09/05/2023 0803 by Sherleen Flor, RN Outcome: Completed/Met 09/04/2023 1842 by Sherleen Flor, RN Outcome: Progressing

## 2023-09-06 ENCOUNTER — Encounter (HOSPITAL_COMMUNITY): Payer: Self-pay | Admitting: Neurosurgery

## 2023-09-07 ENCOUNTER — Other Ambulatory Visit: Payer: Self-pay

## 2023-09-07 ENCOUNTER — Telehealth: Payer: Self-pay | Admitting: Internal Medicine

## 2023-09-07 DIAGNOSIS — E1165 Type 2 diabetes mellitus with hyperglycemia: Secondary | ICD-10-CM

## 2023-09-07 MED ORDER — HYDROXYZINE PAMOATE 25 MG PO CAPS: ORAL_CAPSULE | ORAL | 1 refills | Status: DC

## 2023-09-07 MED ORDER — LANTUS SOLOSTAR 100 UNIT/ML ~~LOC~~ SOPN: 20.0000 [IU] | PEN_INJECTOR | Freq: Every day | SUBCUTANEOUS | 2 refills | Status: DC

## 2023-09-07 MED ORDER — MONTELUKAST SODIUM 10 MG PO TABS: 10.0000 mg | ORAL_TABLET | Freq: Every day | ORAL | 1 refills | Status: DC

## 2023-09-07 MED ORDER — FEXOFENADINE HCL 180 MG PO TABS: 360.0000 mg | ORAL_TABLET | Freq: Two times a day (BID) | ORAL | 1 refills | Status: DC

## 2023-09-07 MED ORDER — FAMOTIDINE 40 MG PO TABS: 40.0000 mg | ORAL_TABLET | Freq: Two times a day (BID) | ORAL | 1 refills | Status: DC

## 2023-09-07 NOTE — Telephone Encounter (Signed)
 I called patient's wife I verified DPR and informed medications have been sent into [facility]. I also scheduled patient's upcoming xolair . Patient is due on 09-09-23 but is unable to get into a car or move much due to a recent procedure until after his f/u on 09-17-23. I scheduled patient for 09-22-22

## 2023-09-07 NOTE — Telephone Encounter (Signed)
 Patient's wife called and is requesting a change of pharmacy for [employer]. She said they are no longer using [facility] in [place], they are now using [facility]  and would like this change to be made for his prescriptions.

## 2023-09-07 NOTE — Telephone Encounter (Signed)
 Patient advised.

## 2023-09-08 ENCOUNTER — Other Ambulatory Visit: Payer: Self-pay | Admitting: Internal Medicine

## 2023-09-08 DIAGNOSIS — E1165 Type 2 diabetes mellitus with hyperglycemia: Secondary | ICD-10-CM

## 2023-09-08 MED ORDER — SERTRALINE HCL 50 MG PO TABS: 50.0000 mg | ORAL_TABLET | Freq: Every morning | ORAL | 2 refills | Status: DC

## 2023-09-08 NOTE — Telephone Encounter (Signed)
 Copied from CRM [id number]. Topic: Clinical - Medication Refill >> Sep 08, 2023 10:49 AM Benton KIDD wrote: Most Recent Primary Care Visit:  Provider: DIXON, PHILLIP E  Department: [facility]  Visit Type: OFFICE VISIT  Date: 08/06/2023  Medication: sertraline  (ZOLOFT ) 50 MG tablet  Has the patient contacted their pharmacy?  (Agent: If no, request that the patient contact the pharmacy for the refill. If patient does not wish to contact the pharmacy document the reason why and proceed with request.) (Agent: If yes, when and what did the pharmacy advise?)  Is this the correct pharmacy for this prescription?  If no, delete pharmacy and type the correct one.  This is the patient's preferred pharmacy:  [facility] [place], [place] - [id number] [place] [address] Phone: [phone] Fax: [phone]  [facility] - [place], [place] - [address] [place] [address] Phone: [phone] Fax: [phone]   Has the prescription been filled recently?   Is the patient out of the medication?   Has the patient been seen for an appointment in the last year OR does the patient have an upcoming appointment?   Can we respond through MyChart?   Agent: Please be advised that Rx refills may take up to 3 business days. We ask that you follow-up with your pharmacy.

## 2023-09-08 NOTE — Telephone Encounter (Signed)
 Copied from CRM [id number]. Topic: Clinical - Medication Refill >> Sep 08, 2023 11:05 AM Benton KIDD wrote: Most Recent Primary Care Visit:  Provider: DIXON, PHILLIP E  Department: [facility]  Visit Type: OFFICE VISIT  Date: 08/06/2023  Medication: ***  Has the patient contacted their pharmacy?  (Agent: If no, request that the patient contact the pharmacy for the refill. If patient does not wish to contact the pharmacy document the reason why and proceed with request.) (Agent: If yes, when and what did the pharmacy advise?)  Is this the correct pharmacy for this prescription?  If no, delete pharmacy and type the correct one.  This is the patient's preferred pharmacy:  [facility] [place], [place] - [id number] [place] [address] Phone: [phone] Fax: [phone]  [facility] - [place], [place] - [address] [place] [address] Phone: [phone] Fax: [phone]   Has the prescription been filled recently?   Is the patient out of the medication?   Has the patient been seen for an appointment in the last year OR does the patient have an upcoming appointment?   Can we respond through MyChart?   Agent: Please be advised that Rx refills may take up to 3 business days. We ask that you follow-up with your pharmacy.

## 2023-09-09 ENCOUNTER — Ambulatory Visit: Payer: PPO | Admitting: Urology

## 2023-09-23 ENCOUNTER — Telehealth: Payer: Self-pay | Admitting: Internal Medicine

## 2023-09-23 ENCOUNTER — Ambulatory Visit: Payer: PPO

## 2023-09-23 ENCOUNTER — Other Ambulatory Visit: Payer: Self-pay

## 2023-09-23 ENCOUNTER — Other Ambulatory Visit: Payer: Self-pay | Admitting: Internal Medicine

## 2023-09-23 DIAGNOSIS — E1165 Type 2 diabetes mellitus with hyperglycemia: Secondary | ICD-10-CM

## 2023-09-23 DIAGNOSIS — L501 Idiopathic urticaria: Secondary | ICD-10-CM | POA: Diagnosis not present

## 2023-09-23 MED ORDER — LANTUS SOLOSTAR 100 UNIT/ML ~~LOC~~ SOPN: 20.0000 [IU] | PEN_INJECTOR | Freq: Every day | SUBCUTANEOUS | 2 refills | Status: DC

## 2023-09-23 MED ORDER — ACCU-CHEK GUIDE TEST VI STRP: ORAL_STRIP | 0 refills | Status: DC

## 2023-09-23 MED ORDER — GLOBAL EASE INJECT PEN NEEDLES 31G X 5 MM MISC: 1.0000 | Freq: Every day | 0 refills | Status: DC

## 2023-09-23 MED ORDER — OMALIZUMAB 300 MG/2  ML ~~LOC~~ SOSY
300.0000 mg | PREFILLED_SYRINGE | SUBCUTANEOUS | Status: AC
  Administered 2023-09-23 – 2024-09-23 (×11): 300 mg via SUBCUTANEOUS

## 2023-09-23 MED ORDER — ONETOUCH DELICA PLUS LANCET33G MISC: 1.0000 | Freq: Three times a day (TID) | 0 refills | Status: AC

## 2023-09-23 NOTE — Addendum Note (Signed)
Addended by: Billie Lade on: 09/23/2023 05:11 PM   Modules accepted: Orders

## 2023-09-23 NOTE — Telephone Encounter (Signed)
Copied from CRM [id number]. Topic: Clinical - Medication Refill >> Sep 23, 2023 12:28 PM Dennison Nancy wrote: Most Recent Primary Care Visit:  Provider: Christel Mormon E  Department: [facility]  Visit Type: OFFICE VISIT  Date: 08/06/2023  Medication: one touch verio test strip and Easy glide pen needles 5mm , 31 gage  Has the patient contacted their pharmacy? Yes pharmacy ask to fax over so can keep on their records  (Agent: If no, request that the patient contact the pharmacy for the refill. If patient does not wish to contact the pharmacy document the reason why and proceed with request.) (Agent: If yes, when and what did the pharmacy advise?)  Is this the correct pharmacy for this prescription? Yes If no, delete pharmacy and type the correct one.  This is the patient's preferred pharmacy:  [facility] [place], [place] - [id number] [place] [address] Phone: [phone] Fax: [phone]   Has the prescription been filled recently? No  Is the patient out of the medication? No  will be out of the strips tomorrow  09/24/23   Has the patient been seen for an appointment in the last year OR does the patient have an upcoming appointment? Yes  Can we respond through MyChart? No  Agent: Please be advised that Rx refills may take up to 3 business days. We ask that you follow-up with your pharmacy.

## 2023-09-23 NOTE — Telephone Encounter (Signed)
Refills sent to pharmacy. 

## 2023-09-23 NOTE — Telephone Encounter (Signed)
Copied from CRM [id number]. Topic: Clinical - Prescription Issue >> Sep 23, 2023 12:53 PM Dennison Nancy wrote: Reason for CRM: lantus solostar 100 units was given a month supply and was suppose to be put on a  3 month supply issue the solution keep kicking out of the system meaning pharmacy will not do the refill  The first prescription Dr. Christel Mormon  put patient on a regular prescription an was use up right away had to use every day , and patient been out for about a month  Patient can not get refill  unless the provider increase by 4 units over 200

## 2023-09-23 NOTE — Telephone Encounter (Signed)
Copied from CRM [id number]. Topic: Clinical - Medication Refill >> Sep 23, 2023 12:45 PM Dennison Nancy wrote: Most Recent Primary Care Visit:  Provider: Billie Lade  Department: [facility]  Visit Type: OFFICE VISIT  Date: 08/06/2023  Medication:  Lancets (ONETOUCH DELICA PLUS LANCET33G) MISC,insulin glargine (LANTUS SOLOSTAR) 100 UNIT/ML Solostar Pen   Has the patient contacted their pharmacy? Yes (Agent: If no, request that the patient contact the pharmacy for the refill. If patient does not wish to contact the pharmacy document the reason why and proceed with request.) (Agent: If yes, when and what did the pharmacy advise?)  Is this the correct pharmacy for this prescription? Yes If no, delete pharmacy and type the correct one.  This is the patient's preferred pharmacy:  [facility] [place], [place] - [id number] [place] [address] Phone: [phone] Fax: [phone]  [facility] - [place], [place] - [address] [place] [address] Phone: [phone] Fax: [phone]   Has the prescription been filled recently? Yes  Is the patient out of the medication? Yes  Has the patient been seen for an appointment in the last year OR does the patient have an upcoming appointment? Yes  Can we respond through MyChart? No  Agent: Please be advised that Rx refills may take up to 3 business days. We ask that you follow-up with your pharmacy.

## 2023-09-24 NOTE — Telephone Encounter (Signed)
Patient advised.

## 2023-09-28 ENCOUNTER — Other Ambulatory Visit: Payer: Self-pay | Admitting: Internal Medicine

## 2023-09-28 DIAGNOSIS — E1165 Type 2 diabetes mellitus with hyperglycemia: Secondary | ICD-10-CM

## 2023-10-19 ENCOUNTER — Ambulatory Visit: Payer: PPO

## 2023-10-19 ENCOUNTER — Ambulatory Visit: Payer: PPO | Admitting: Internal Medicine

## 2023-10-20 ENCOUNTER — Ambulatory Visit (INDEPENDENT_AMBULATORY_CARE_PROVIDER_SITE_OTHER): Payer: PPO

## 2023-10-20 ENCOUNTER — Encounter (INDEPENDENT_AMBULATORY_CARE_PROVIDER_SITE_OTHER): Payer: Self-pay | Admitting: *Deleted

## 2023-10-20 VITALS — Ht 66.0 in | Wt 225.0 lb

## 2023-10-20 DIAGNOSIS — Z0001 Encounter for general adult medical examination with abnormal findings: Secondary | ICD-10-CM

## 2023-10-20 DIAGNOSIS — F17210 Nicotine dependence, cigarettes, uncomplicated: Secondary | ICD-10-CM | POA: Diagnosis not present

## 2023-10-20 DIAGNOSIS — Z1211 Encounter for screening for malignant neoplasm of colon: Secondary | ICD-10-CM

## 2023-10-20 DIAGNOSIS — Z87891 Personal history of nicotine dependence: Secondary | ICD-10-CM

## 2023-10-20 DIAGNOSIS — Z0000 Encounter for general adult medical examination without abnormal findings: Secondary | ICD-10-CM

## 2023-10-20 NOTE — Progress Notes (Signed)
Because this visit was a virtual/telehealth visit,  certain criteria was not obtained, such a blood pressure, CBG if applicable, and timed get up and go. Any medications not marked as "taking" were not mentioned during the medication reconciliation part of the visit. Any vitals not documented were not able to be obtained due to this being a telehealth visit or patient was unable to self-report a recent blood pressure reading due to a lack of equipment at home via telehealth. Vitals that have been documented are verbally provided by the patient.  Interactive audio and video telecommunications were attempted between this provider and patient, however failed, due to patient having technical difficulties OR patient did not have access to video capability.  We continued and completed visit with audio only.  Subjective:   Christopher [name] is a 68 y.o. male who presents for Medicare Annual/Subsequent preventive examination.  Visit Complete: Virtual I connected with  Christopher [name] on 10/20/23 by a audio enabled telemedicine application and verified that I am speaking with the correct person using two identifiers.  Patient Location: Home  Provider Location: Home Office  I discussed the limitations of evaluation and management by telemedicine. The patient expressed understanding and agreed to proceed.  Vital Signs: Because this visit was a virtual/telehealth visit, some criteria may be missing or patient reported. Any vitals not documented were not able to be obtained and vitals that have been documented are patient reported.  Cardiac Risk Factors include: advanced age (>16men, >4 women);diabetes mellitus;male gender;sedentary lifestyle;dyslipidemia     Objective:    Today's Vitals   10/20/23 1448 10/20/23 1449  Weight: 225 lb (102.1 kg)   Height: 5\' 6"  (1.676 m)   PainSc:  7    Body mass index is 36.32 kg/m.     10/20/2023    2:56 PM 09/04/2023    9:45 AM 05/22/2023    2:34 PM 10/25/2022     6:42 PM 10/24/2022    3:16 PM 10/10/2022    2:13 PM 09/17/2022    1:21 PM  Advanced Directives  Does Patient Have a Medical Advance Directive? No Yes Yes  No No Yes  Type of Advance Directive  Living will Healthcare Power of [place];Living will    Healthcare Power of Attorney  Does patient want to make changes to medical advance directive?  No - Patient declined No - Patient declined      Would patient like information on creating a medical advance directive? No - Patient declined   No - Patient declined  No - Patient declined     Current Medications (verified) Outpatient Encounter Medications as of 10/20/2023  Medication Sig   albuterol (VENTOLIN HFA) 108 (90 Base) MCG/ACT inhaler inhale 2 puffs into lungs every 6 hours as needed for wheezing or shortness of breath   Blood Glucose Monitoring Suppl DEVI 1 each by Does not apply route in the morning, at noon, and at bedtime. May substitute to any manufacturer covered by patient's insurance.   cyclobenzaprine (FLEXERIL) 10 MG tablet Take 1 tablet (10 mg total) by mouth 3 (three) times daily as needed for muscle spasms.   EPINEPHrine 0.3 mg/0.3 mL IJ SOAJ injection Inject 0.3 mg into the muscle as needed for anaphylaxis.   famotidine (PEPCID) 40 MG tablet Take 1 tablet (40 mg total) by mouth 2 (two) times daily.   fexofenadine (ALLEGRA ALLERGY) 180 MG tablet Take 2 tablets (360 mg total) by mouth in the morning and at bedtime.   gabapentin (NEURONTIN) 300  MG capsule Take 300 mg by mouth 3 (three) times daily.   GLOBAL EASE INJECT PEN NEEDLES 31G X 5 MM MISC Inject 1 Syringe into the skin daily.   glucose blood (ACCU-CHEK GUIDE TEST) test strip Use as instructed   HYDROcodone-acetaminophen (NORCO) 7.5-325 MG tablet Take 1-2 tablets by mouth every 4 (four) hours as needed for severe pain (pain score 7-10) or moderate pain (pain score 4-6).   hydrOXYzine (VISTARIL) 25 MG capsule Take 25 to 50 mg once at bedtime as needed.   insulin glargine (LANTUS  SOLOSTAR) 100 UNIT/ML Solostar Pen Inject 20 Units into the skin at bedtime.   Lancets (ONETOUCH DELICA PLUS LANCET33G) MISC 1 each by Does not apply route in the morning, at noon, and at bedtime.   meloxicam (MOBIC) 7.5 MG tablet Take 7.5 mg by mouth 2 (two) times daily.   Menthol, Topical Analgesic, (FREEZE IT FAST PAIN RELIEF EX) Apply 1 Application topically daily. 10%   metFORMIN (GLUCOPHAGE-XR) 500 MG 24 hr tablet Take 2 tablets (1,000 mg total) by mouth 2 (two) times daily with a meal. (Patient taking differently: Take 500 mg by mouth 2 (two) times daily with a meal.)   montelukast (SINGULAIR) 10 MG tablet Take 1 tablet (10 mg total) by mouth at bedtime.   nitroGLYCERIN (NITROSTAT) 0.4 MG SL tablet PLACE (1) TABLET UNDER TONGUE EVERY 5 MINUTES UP TO (3) DOSES. IF NO RELIEF CALL 911.   Omalizumab (XOLAIR [place]) Inject 2 application  into the skin every 30 (thirty) days. Given at Dr Isidore Moos by injection   ondansetron (ZOFRAN) 4 MG tablet Take 1 tablet (4 mg total) by mouth every 8 (eight) hours as needed for nausea.   pantoprazole (PROTONIX) 40 MG tablet TAKE (1) TABLET TWICE A DAY BEFORE MEALS. (Patient taking differently: Take 40 mg by mouth daily.)   rosuvastatin (CRESTOR) 20 MG tablet take 1 tablet by mouth once daily.   Semaglutide,0.25 or 0.5MG /DOS, (OZEMPIC, 0.25 OR 0.5 MG/DOSE,) 2 MG/3ML SOPN INJECT 0.5 MG INTO THE SKIN ONCE A WEEK FOR 28 DAYS.   sertraline (ZOLOFT) 50 MG tablet Take 1 tablet (50 mg total) by mouth in the morning.   Testosterone Undecanoate (JATENZO) 237 MG CAPS Take 1 capsule (237 mg total) by mouth in the morning and at bedtime. (Patient taking differently: Take by mouth in the morning and at bedtime.)   Tiotropium Bromide-Olodaterol (STIOLTO RESPIMAT) 2.5-2.5 MCG/ACT AERS Inhale 2 puffs into the lungs daily.   Facility-Administered Encounter Medications as of 10/20/2023  Medication   omalizumab Geoffry Paradise) prefilled syringe 300 mg    Allergies (verified) Celebrex  [celecoxib], Codeine, Cortisone, Doxycycline, Medrol [methylprednisolone], Prednisone, Relafen [nabumetone], Aspirin, Oxycodone-acetaminophen, Raloxifene, and Rofecoxib   History: Past Medical History:  Diagnosis Date   Anxiety    Arthritis    Asthma    BPH (benign prostatic hyperplasia)    Complication of anesthesia    pt had a hard time being able to move after spinal anesthesia , 3-4 hours   COPD (chronic obstructive pulmonary disease) (HCC)    Depression    Diabetes mellitus without complication (HCC)    GERD (gastroesophageal reflux disease)    Gout    no meds   Headache(784.0)    otc meds prn   Heart murmur    dx as a child, no problems as an adult   History of COVID-19 2024   History of hiatal hernia    Hyperlipidemia    IBS (irritable bowel syndrome)    PONV (postoperative  nausea and vomiting)    Sleep apnea    uses CIPAP machine at night   Wears partial dentures    bottom partial   Past Surgical History:  Procedure Laterality Date   ANTERIOR CERVICAL DECOMP/DISCECTOMY FUSION N/A 10/13/2022   Procedure: ANTERIOR CERVICAL DISECTOMY FUSION - CERVICAL THREE-CERVICAL FOUR - CERVICAL FOUR-CERVICAL FIVE REMOVAL OF HARDWARE CERVICAL FIVE-CERVICAL SIX;  Surgeon: Donalee Citrin, MD;  Location: [facility];  Service: Neurosurgery;  Laterality: N/A;   APPENDECTOMY     BACK SURGERY  2002   neck and back fusion   BIOPSY  12/27/2015   Procedure: BIOPSY;  Surgeon: Malissa Hippo, MD;  Location: [facility];  Service: Endoscopy;;  Fundus biopsies and duodenal biopsies   BIOPSY  02/10/2020   Procedure: BIOPSY;  Surgeon: Malissa Hippo, MD;  [facility]: [facility];  Service: Endoscopy;;  antral   CARDIAC CATHETERIZATION     CARDIAC CATHETERIZATION N/A 09/04/2016   Procedure: Right/Left Heart Cath and Coronary Angiography;  Surgeon: Peter M [place]az[place]and, MD;  Location: [facility] INVASIVE CV LAB;  Service: Cardiovascular;  Laterality: N/A;   CHOLECYSTECTOMY     CHONDROPLASTY  08/15/2011    Procedure: CHONDROPLASTY;  Surgeon: Fuller Canada, MD;  Location: [facility] ORS;  Service: Orthopedics;  Laterality: Left;   COLONOSCOPY  06/27/2011   Procedure: COLONOSCOPY;  Surgeon: Malissa Hippo, MD;  [facility];  Service: Endoscopy;  Laterality: N/A;  9:00 / Pt to be here at 9am for 10:45 procedure, benign polyps removed   COLONOSCOPY N/A 10/05/2014   Procedure: COLONOSCOPY;  Surgeon: Malissa Hippo, MD;  [facility];  Service: Endoscopy;  Laterality: N/A;  930   COLONOSCOPY N/A 02/11/2018   Procedure: COLONOSCOPY;  Surgeon: Malissa Hippo, MD;  [facility];  Service: Endoscopy;  Laterality: N/A;  830   ESOPHAGOGASTRODUODENOSCOPY N/A 12/27/2015   Procedure: ESOPHAGOGASTRODUODENOSCOPY (EGD);  Surgeon: Malissa Hippo, MD;  [facility];  Service: Endoscopy;  Laterality: N/A;  3:00   ESOPHAGOGASTRODUODENOSCOPY (EGD) WITH PROPOFOL N/A 02/10/2020   Procedure: ESOPHAGOGASTRODUODENOSCOPY (EGD) WITH PROPOFOL;  Surgeon: Malissa Hippo, MD;  [facility];  Service: Endoscopy;  Laterality: N/A;  155   HERNIA REPAIR  1998   umbilical hernia   JOINT REPLACEMENT  2023   knee and shoulder   KNEE ARTHROSCOPY     left knee   KNEE ARTHROSCOPY     right knee    LUMBAR LAMINECTOMY/DECOMPRESSION MICRODISCECTOMY  09/14/2012   Procedure: LUMBAR LAMINECTOMY/DECOMPRESSION MICRODISCECTOMY 1 LEVEL;  Surgeon: Karn Cassis, MD;  [facility]: [facility];  Service: Neurosurgery;  Laterality: Right;  Right Lumbar three-four Diskectomy   neck fusion  2005   NECK SURGERY     PATELLA-FEMORAL ARTHROPLASTY Right 02/18/2022   Procedure: RIGHT KNEE PATELLA REPLACEMENT, CEMENTED;  Surgeon: Cammy Copa, MD;  Location: [facility];  Service: Orthopedics;  Laterality: Right;   POLYPECTOMY  02/11/2018   Procedure: POLYPECTOMY;  Surgeon: Malissa Hippo, MD;  [facility];  Service: Endoscopy;;  colon   POSTERIOR CERVICAL FUSION/FORAMINOTOMY N/A  09/04/2023   Procedure: Posterior Cervical Fusion, Cervical Five-Cervical Four and Cervical Three-Cervical Three-Cervical Four;  Surgeon: Donalee Citrin, MD;  Location[facility] OR;  Service: Neurosurgery;  Laterality: N/A;   REVISION TOTAL SHOULDER TO REVERSE TOTAL SHOULDER Left 12/16/2018   Procedure: REVISION TOTAL SHOULDER TO REVERSE TOTAL SHOULDER;  Surgeon: Cammy Copa, MD;  [facility];  Service: Orthopedics;  Laterality: Left;  SHOULDER ARTHROSCOPY WITH BICEPSTENOTOMY Left 09/23/2013   Procedure: SHOULDER ARTHROSCOPY WITH BICEPSTENOTOMY AND EXTENSIVE DEBRIDEMENT;  Surgeon: Vickki Hearing, MD;  Location: [facility];  Service: Orthopedics;  Laterality: Left;   SHOULDER ARTHROSCOPY WITH ROTATOR CUFF REPAIR Left 05/05/2014   Procedure: SHOULDER ARTHROSCOPY LIMITED DEBRIDEMENT;  Surgeon: Vickki Hearing, MD;  Location: [facility];  Service: Orthopedics;  Laterality: Left;   SHOULDER OPEN ROTATOR CUFF REPAIR Left 05/05/2014   Procedure: ROTATOR CUFF REPAIR SHOULDER OPEN;  Surgeon: Vickki Hearing, MD;  Location: [facility];  Service: Orthopedics;  Laterality: Left;   SHOULDER OPEN ROTATOR CUFF REPAIR Left 12/16/2018   Procedure: LEFT SHOULDER POSSIBLE SUBSCAPULARIS REPAIR VS. REVISION TO REVERSE TOTAL SHOULDER REPLACEMENT;  Surgeon: Cammy Copa, MD;  Location: [facility];  Service: Orthopedics;  Laterality: Left;   SHOULDER SURGERY Right    Open [place] procedure   SPINAL FUSION     x2, 2003 and 2007   TOTAL KNEE ARTHROPLASTY Left 06/16/2017   Procedure: LEFT TOTAL KNEE ARTHROPLASTY;  Surgeon: Vickki Hearing, MD;  Location: [facility];  Service: Orthopedics;  Laterality: Left;   TOTAL KNEE ARTHROPLASTY Right 05/14/2021   Procedure: TOTAL KNEE ARTHROPLASTY;  Surgeon: Vickki Hearing, MD;  Location: [facility];  Service: Orthopedics;  Laterality: Right;   TOTAL SHOULDER ARTHROPLASTY Left 08/19/2018   Procedure: left shoulder replacement;  Surgeon: Cammy Copa, MD;  Location: [facility] OR;   Service: Orthopedics;  Laterality: Left;   Family History  Problem Relation Age of Onset   Diabetes Mother    Alzheimer's disease Father    Diabetes Father    Diabetes Other    Lung disease Other    Arthritis Other    Anesthesia problems Neg Hx    Hypotension Neg Hx    Malignant hyperthermia Neg Hx    Pseudochol deficiency Neg Hx    Sleep apnea Neg Hx    Social History   Socioeconomic History   Marital status: Married    Spouse name: Not on file   Number of children: Not on file   Years of education: Not on file   Highest education level: Not on file  Occupational History   Occupation: Therapist, music    Employer: DISABLED  Tobacco Use   Smoking status: Former    Current packs/day: 0.00    Average packs/day: 1 pack/day for 44.0 years (44.0 ttl pk-yrs)    Types: Cigarettes    Start date: 08/25/1978    Quit date: 08/25/2022    Years since quitting: 1.1    Passive exposure: Current   Smokeless tobacco: Never   Tobacco comments:    smokes a pack a day. since age 36  Vaping Use   Vaping status: Never Used  Substance and Sexual Activity   Alcohol use: Never   Drug use: Never   Sexual activity: Yes  Other Topics Concern   Not on file  Social History Narrative   Lives at home with wife   Right handed   Caffeine: 2 cups of coffee, 2 cans of soda, 4-5 cups of tea daily.   Social Drivers of Corporate investment banker Strain: Low Risk  (10/20/2023)   Overall Financial Resource Strain (CARDIA)    Difficulty of Paying Living Expenses: Not hard at all  Food Insecurity: No Food Insecurity (10/20/2023)   Hunger Vital Sign    Worried About Running Out of Food in the Last Year: Never true    Ran Out of Food in  the Last Year: Never true  Transportation Needs: No Transportation Needs (10/20/2023)   PRAPARE - Administrator, Civil Service (Medical): No    Lack of Transportation (Non-Medical): No  Physical Activity: Inactive (10/20/2023)   Exercise Vital Sign     Days of Exercise per Week: 0 days    Minutes of Exercise per Session: 0 min  Stress: No Stress Concern Present (10/20/2023)   [employer] of Occupational Health - Occupational Stress Questionnaire    Feeling of Stress : Not at all  Social Connections: Socially Isolated (10/20/2023)   Social Connection and Isolation Panel [NHANES]    Frequency of Communication with Friends and Family: Once a week    Frequency of Social Gatherings with Friends and Family: Once a week    Attends Religious Services: Never    Database administrator or Organizations: No    Attends Banker Meetings: Never    Marital Status: Married    Tobacco Counseling Counseling given: Yes Tobacco comments: smokes a pack a day. since age 43   Clinical Intake:  Pre-visit preparation completed: Yes  Pain : 0-10 Pain Score: 7  Pain Type: Other (Comment) (recently had neck surgery Sep 04, 2023) Pain Location: Neck Pain Orientation: Posterior Pain Descriptors / Indicators: Constant Pain Onset: More than a month ago (post surgery) Pain Frequency: Constant     BMI - recorded: 36.32 Nutritional Risks: None Diabetes: Yes CBG done?: No (telehealth visit) Did pt. bring in CBG monitor from home?: No  How often do you need to have someone help you when you read instructions, pamphlets, or other written materials from your doctor or pharmacy?: 1 - Never  Interpreter Needed?: No  Information entered by :: Maryjean Ka CMA   Activities of Daily Living    10/20/2023    2:52 PM 09/04/2023   11:27 AM  In your present state of health, do you have any difficulty performing the following activities:  Hearing? 0   Vision? 0   Difficulty concentrating or making decisions? 0   Walking or climbing stairs? 0   Comment recent neck surgery   Dressing or bathing? 1   Comment recent neck surgery Sep 04, 2023   Doing errands, shopping? 0 0  Preparing Food and eating ? Y   Comment recent neck surgery   Using the  Toilet? N   In the past six months, have you accidently leaked urine? N   Do you have problems with loss of bowel control? N   Managing your Medications? N   Managing your Finances? N   Housekeeping or managing your Housekeeping? N     Patient Care Team: Billie Lade, MD as PCP - General (Internal Medicine) Marjo Bicker, MD as PCP - Cardiology (Cardiology) August Saucer Corrie Mckusick, MD as Consulting Physician (Orthopedic Surgery) Donalee Citrin, MD as Consulting Physician (Neurosurgery) Dr [facility], OD (Optometry)  Indicate any recent Medical Services you may have received from other than [facility] providers in the past year (date may be approximate).     Assessment:   This is a routine wellness examination for Remer.  Hearing/Vision screen Hearing Screening - Comments:: Patient denies any hearing difficulties.    Vision Screening - Comments:: .Wears rx glasses - up to date with routine eye exams  Sees Dr. Desiree Lucy in [place]   Goals Addressed             This Visit's Progress    Patient Stated  Get better control of pain and improve my health       Depression Screen    10/20/2023    2:59 PM 08/06/2023   10:13 AM 07/08/2023    3:09 PM 01/05/2023    1:05 PM 11/03/2022    3:48 PM 10/29/2022    4:04 PM 10/22/2022    9:45 AM  PHQ 2/9 Scores  PHQ - 2 Score 0 0 0 0 1 4 1   PHQ- 9 Score    2 7 13      Fall Risk    10/20/2023    2:57 PM 08/06/2023   10:13 AM 07/08/2023    3:08 PM 01/05/2023    1:05 PM 11/03/2022    3:48 PM  Fall Risk   Falls in the past year? 0 0 0 1 0  Number falls in past yr: 0 0 0 0 0  Injury with Fall? 0 0 0 0 0  Risk for fall due to : No Fall Risks No Fall Risks No Fall Risks No Fall Risks   Follow up Falls prevention discussed;Falls evaluation completed Falls evaluation completed Falls evaluation completed Falls evaluation completed     MEDICARE RISK AT HOME: Medicare Risk at Home Any stairs in or around the home?:  No If so, are there any without handrails?: No Home free of loose throw rugs in walkways, pet beds, electrical cords, etc?: Yes Adequate lighting in your home to reduce risk of falls?: Yes Life alert?: No Use of a cane, walker or w/c?: No Grab bars in the bathroom?: Yes Shower chair or bench in shower?: Yes Elevated toilet seat or a handicapped toilet?: Yes  TIMED UP AND GO:  Was the test performed?  No    Cognitive Function:       10/20/2023    2:58 PM  6CIT Screen  What Year? 0 points  What month? 0 points  What time? 0 points  Count back from 20 0 points  Months in reverse 0 points  Repeat phrase 0 points  Total Score 0 points    Immunizations Immunization History  Administered Date(s) Administered   Fluad Trivalent(High Dose 65+) 06/01/2023   Influenza Split 09/16/2012   Influenza,inj,Quad PF,6+ Mos 06/06/2022   PNEUMOCOCCAL CONJUGATE-20 02/18/2021   Pneumococcal Polysaccharide-23 07/03/1997   Tdap 03/14/2017    TDAP status: Up to date  Flu Vaccine status: Up to date  Pneumococcal vaccine status: Up to date  Covid-19 vaccine status: Declined, Education has been provided regarding the importance of this vaccine but patient still declined. Advised may receive this vaccine at local pharmacy or Health Dept.or vaccine clinic. Aware to provide a copy of the vaccination record if obtained from local pharmacy or Health Dept. Verbalized acceptance and understanding.  Qualifies for Shingles Vaccine? Yes   Zostavax completed No   Shingrix Completed?: No.    Education has been provided regarding the importance of this vaccine. Patient has been advised to call insurance company to determine out of pocket expense if they have not yet received this vaccine. Advised may also receive vaccine at local pharmacy or Health Dept. Verbalized acceptance and understanding.  Screening Tests Health Maintenance  Topic Date Due   FOOT EXAM  Never done   OPHTHALMOLOGY EXAM  Never done    Colonoscopy  02/12/2023   Lung Cancer Screening  07/23/2023   HEMOGLOBIN A1C  01/05/2024   Diabetic kidney evaluation - Urine ACR  07/07/2024   Diabetic kidney evaluation - eGFR measurement  09/03/2024  Medicare Annual Wellness (AWV)  10/19/2024   DTaP/Tdap/Td (2 - Td or Tdap) 03/15/2027   Pneumonia Vaccine 18+ Years old  Completed   INFLUENZA VACCINE  Completed   Hepatitis C Screening  Completed   HIV Screening  Completed   HPV VACCINES  Aged Out   COVID-19 Vaccine  Discontinued   Zoster Vaccines- Shingrix  Discontinued    Health Maintenance  Health Maintenance Due  Topic Date Due   FOOT EXAM  Never done   OPHTHALMOLOGY EXAM  Never done   Colonoscopy  02/12/2023   Lung Cancer Screening  07/23/2023    Colorectal cancer screening: Referral to GI placed 10/20/2023. Pt aware the office will call re: appt.  Lung Cancer Screening: (Low Dose CT Chest recommended if Age 78-80 years, 20 pack-year currently smoking OR have quit w/in 15years.) does qualify.   Lung Cancer Screening Referral: 10/20/2023  Additional Screening:  Hepatitis C Screening: does not qualify; Completed   Vision Screening: Recommended annual ophthalmology exams for early detection of glaucoma and other disorders of the eye. Is the patient up to date with their annual eye exam?  Yes  Who is the provider or what is the name of the office in which the patient attends annual eye exams? Desiree Lucy  Dental Screening: Recommended annual dental exams for proper oral hygiene  Diabetic Foot Exam: Diabetic Foot Exam: Overdue, Pt has been advised about the importance in completing this exam. Pt is scheduled for diabetic foot exam on provider made aware.  Community Resource Referral / Chronic Care Management: CRR required this visit?  No   CCM required this visit?  No     Plan:     I have personally reviewed and noted the following in the patient's chart:   Medical and social history Use of alcohol, tobacco  or illicit drugs  Current medications and supplements including opioid prescriptions. Patient is currently taking opioid prescriptions. Information provided to patient regarding non-opioid alternatives. Patient advised to discuss non-opioid treatment plan with their provider. Functional ability and status Nutritional status Physical activity Advanced directives List of other physicians Hospitalizations, surgeries, and ER visits in previous 12 months Vitals Screenings to include cognitive, depression, and falls Referrals and appointments  In addition, I have reviewed and discussed with patient certain preventive protocols, quality metrics, and best practice recommendations. A written personalized care plan for preventive services as well as general preventive health recommendations were provided to patient.     Jordan Hawks Elwanda Moger, CMA   10/20/2023   After Visit Summary: (Mail) Due to this being a telephonic visit, the after visit summary with patients personalized plan was offered to patient via mail   Nurse Notes: see routing comment

## 2023-10-20 NOTE — Patient Instructions (Addendum)
Christopher [name] , Thank you for taking time to come for your Medicare Wellness Visit. I appreciate your ongoing commitment to your health goals. Please review the following plan we discussed and let me know if I can assist you in the future.   Referrals/Orders/Follow-Ups/Clinician Recommendations:   Next Medicare Annual Wellness Visit:  October 25, 2024 at 3:10 pm telephone visit.   A lung cancer screening has been placed for you today. Please call the number below to schedule that for when it's convenient for your schedule.    [facility] at [facility] [address] [place], [place] [address] [phone]  You have been referred to [facility].If you haven't heard from them within the next week, please call them to schedule your appointment.    Address: [address], [place], [place] [address] Phone: [phone]  Wishing you a speedy recovery!!!  This is a list of the screening recommended for you and due dates:  Health Maintenance  Topic Date Due   Complete foot exam   Never done   Eye exam for diabetics  Never done   Colon Cancer Screening  02/12/2023   Screening for Lung Cancer  07/23/2023   Hemoglobin A1C  01/05/2024   Yearly kidney health urinalysis for diabetes  07/07/2024   Yearly kidney function blood test for diabetes  09/03/2024   Medicare Annual Wellness Visit  10/19/2024   DTaP/Tdap/Td vaccine (2 - Td or Tdap) 03/15/2027   Pneumonia Vaccine  Completed   Flu Shot  Completed   Hepatitis C Screening  Completed   HIV Screening  Completed   HPV Vaccine  Aged Out   COVID-19 Vaccine  Discontinued   Zoster (Shingles) Vaccine  Discontinued    Advanced directives: (ACP Link)Information on Advanced Care Planning can be found at [facility] Advance Health Care Directives Advance Health Care Directives ([email or web address]   Next Medicare Annual Wellness Visit scheduled for next year: yes  Understanding Your Risk for  Falls Millions of people have serious injuries from falls each year. It is important to understand your risk of falling. Talk with your health care provider about your risk and what you can do to lower it. If you do have a serious fall, make sure to tell your provider. Falling once raises your risk of falling again. How can falls affect me? Serious injuries from falls are common. These include: Broken bones, such as hip fractures. Head injuries, such as traumatic brain injuries (TBI) or concussions. A fear of falling can cause you to avoid activities and stay at home. This can make your muscles weaker and raise your risk for a fall. What can increase my risk? There are a number of risk factors that increase your risk for falling. The more risk factors you have, the higher your risk of falling. Serious injuries from a fall happen most often to people who are older than 66 years old. Teenagers and young adults ages 77-29 are also at higher risk. Common risk factors include: Weakness in the lower body. Being generally weak or confused due to long-term (chronic) illness. Dizziness or balance problems. Poor vision. Medicines that cause dizziness or drowsiness. These may include: Medicines for your blood pressure, heart, anxiety, insomnia, or swelling (edema). Pain medicines. Muscle relaxants. Other risk factors include: Drinking alcohol. Having had a fall in the past. Having foot pain or wearing improper footwear. Working at a dangerous job. Having any of the following in your home: Tripping hazards, such  as floor clutter or loose rugs. Poor lighting. Pets. Having dementia or memory loss. What actions can I take to lower my risk of falling?     Physical activity Stay physically fit. Do strength and balance exercises. Consider taking a regular class to build strength and balance. Yoga and tai chi are good options. Vision Have your eyes checked every year and your prescription for  glasses or contacts updated as needed. Shoes and walking aids Wear non-skid shoes. Wear shoes that have rubber soles and low heels. Do not wear high heels. Do not walk around the house in socks or slippers. Use a cane or walker as told by your provider. Home safety Attach secure railings on both sides of your stairs. Install grab bars for your bathtub, shower, and toilet. Use a non-skid mat in your bathtub or shower. Attach bath mats securely with double-sided, non-slip rug tape. Use good lighting in all rooms. Keep a flashlight near your bed. Make sure there is a clear path from your bed to the bathroom. Use night-lights. Do not use throw rugs. Make sure all carpeting is taped or tacked down securely. Remove all clutter from walkways and stairways, including extension cords. Repair uneven or broken steps and floors. Avoid walking on icy or slippery surfaces. Walk on the grass instead of on icy or slick sidewalks. Use ice melter to get rid of ice on walkways in the winter. Use a cordless phone. Questions to ask your health care provider Can you help me check my risk for a fall? Do any of my medicines make me more likely to fall? Should I take a vitamin D supplement? What exercises can I do to improve my strength and balance? Should I make an appointment to have my vision checked? Do I need a bone density test to check for weak bones (osteoporosis)? Would it help to use a cane or a walker? Where to find more information Centers for Disease Control and Prevention, STEADI: TonerPromos.no Community-Based Fall Prevention Programs: TonerPromos.no [employer] on Aging: BaseRingTones.pl Contact a health care provider if: You fall at home. You are afraid of falling at home. You feel weak, drowsy, or dizzy. This information is not intended to replace advice given to you by your health care provider. Make sure you discuss any questions you have with your health care provider. Document Revised: 04/21/2022  Document Reviewed: 04/21/2022 Elsevier Patient Education  2024 Elsevier Inc.   Managing Pain Without Opioids Opioids are strong medicines used to treat moderate to severe pain. For some people, especially those who have long-term (chronic) pain, opioids may not be the best choice for pain management due to: Side effects like nausea, constipation, and sleepiness. The risk of addiction (opioid use disorder). The longer you take opioids, the greater your risk of addiction. Pain that lasts for more than 3 months is called chronic pain. Managing chronic pain usually requires more than one approach and is often provided by a team of health care providers working together (multidisciplinary approach). Pain management may be done at a pain management center or pain clinic. How to manage pain without the use of opioids Use non-opioid medicines Non-opioid medicines for pain may include: Over-the-counter or prescription non-steroidal anti-inflammatory drugs (NSAIDs). These may be the first medicines used for pain. They work well for muscle and bone pain, and they reduce swelling. Acetaminophen. This over-the-counter medicine may work well for milder pain but not swelling. Antidepressants. These may be used to treat chronic pain. A certain type of  antidepressant (tricyclics) is often used. These medicines are given in lower doses for pain than when used for depression. Anticonvulsants. These are usually used to treat seizures but may also reduce nerve (neuropathic) pain. Muscle relaxants. These relieve pain caused by sudden muscle tightening (spasms). You may also use a pain medicine that is applied to the skin as a patch, cream, or gel (topical analgesic), such as a numbing medicine. These may cause fewer side effects than medicines taken by mouth. Do certain therapies as directed Some therapies can help with pain management. They include: Physical therapy. You will do exercises to gain strength and  flexibility. A physical therapist may teach you exercises to move and stretch parts of your body that are weak, stiff, or painful. You can learn these exercises at physical therapy visits and practice them at home. Physical therapy may also involve: Massage. Heat wraps or applying heat or cold to affected areas. Electrical signals that interrupt pain signals (transcutaneous electrical nerve stimulation, TENS). Weak lasers that reduce pain and swelling (low-level laser therapy). Signals from your body that help you learn to regulate pain (biofeedback). Occupational therapy. This helps you to learn ways to function at home and work with less pain. Recreational therapy. This involves trying new activities or hobbies, such as a physical activity or drawing. Mental health therapy, including: Cognitive behavioral therapy (CBT). This helps you learn coping skills for dealing with pain. Acceptance and commitment therapy (ACT) to change the way you think and react to pain. Relaxation therapies, including muscle relaxation exercises and mindfulness-based stress reduction. Pain management counseling. This may be individual, family, or group counseling.  Receive medical treatments Medical treatments for pain management include: Nerve block injections. These may include a pain blocker and anti-inflammatory medicines. You may have injections: Near the spine to relieve chronic back or neck pain. Into joints to relieve back or joint pain. Into nerve areas that supply a painful area to relieve body pain. Into muscles (trigger point injections) to relieve some painful muscle conditions. A medical device placed near your spine to help block pain signals and relieve nerve pain or chronic back pain (spinal cord stimulation device). Acupuncture. Follow these instructions at home Medicines Take over-the-counter and prescription medicines only as told by your health care provider. If you are taking pain medicine,  ask your health care providers about possible side effects to watch out for. Do not drive or use heavy machinery while taking prescription opioid pain medicine. Lifestyle  Do not use drugs or alcohol to reduce pain. If you drink alcohol, limit how much you have to: 0-1 drink a day for women who are not pregnant. 0-2 drinks a day for men. Know how much alcohol is in a drink. In the [place] one drink equals one 12 oz bottle of beer (355 mL), one 5 oz glass of wine (148 mL), or one 1 oz glass of hard liquor (44 mL). Do not use any products that contain nicotine or tobacco. These products include cigarettes, chewing tobacco, and vaping devices, such as e-cigarettes. If you need help quitting, ask your health care provider. Eat a healthy diet and maintain a healthy weight. Poor diet and excess weight may make pain worse. Eat foods that are high in fiber. These include fresh fruits and vegetables, whole grains, and beans. Limit foods that are high in fat and processed sugars, such as fried and sweet foods. Exercise regularly. Exercise lowers stress and may help relieve pain. Ask your health care provider what activities  and exercises are safe for you. If your health care provider approves, join an exercise class that combines movement and stress reduction. Examples include yoga and tai chi. Get enough sleep. Lack of sleep may make pain worse. Lower stress as much as possible. Practice stress reduction techniques as told by your therapist. General instructions Work with all your pain management providers to find the treatments that work best for you. You are an important member of your pain management team. There are many things you can do to reduce pain on your own. Consider joining an online or in-person support group for people who have chronic pain. Keep all follow-up visits. This is important. Where to find more information You can find more information about managing pain without opioids  from: American Academy of Pain Medicine: painmed.org Institute for Chronic Pain: instituteforchronicpain.org American Chronic Pain Association: theacpa.org Contact a health care provider if: You have side effects from pain medicine. Your pain gets worse or does not get better with treatments or home therapy. You are struggling with anxiety or depression. Summary Many types of pain can be managed without opioids. Chronic pain may respond better to pain management without opioids. Pain is best managed when you and a team of health care providers work together. Pain management without opioids may include non-opioid medicines, medical treatments, physical therapy, mental health therapy, and lifestyle changes. Tell your health care providers if your pain gets worse or is not being managed well enough. This information is not intended to replace advice given to you by your health care provider. Make sure you discuss any questions you have with your health care provider. Document Revised: 11/28/2020 Document Reviewed: 11/28/2020 Elsevier Patient Education  2024 [employer].

## 2023-10-26 ENCOUNTER — Other Ambulatory Visit: Payer: Self-pay

## 2023-10-26 ENCOUNTER — Encounter: Payer: Self-pay | Admitting: Internal Medicine

## 2023-10-26 ENCOUNTER — Ambulatory Visit: Payer: PPO

## 2023-10-26 ENCOUNTER — Ambulatory Visit: Payer: PPO | Admitting: Internal Medicine

## 2023-10-26 VITALS — BP 130/78 | HR 84 | Temp 98.1°F | Resp 14 | Wt 228.5 lb

## 2023-10-26 DIAGNOSIS — L501 Idiopathic urticaria: Secondary | ICD-10-CM | POA: Diagnosis not present

## 2023-10-26 MED ORDER — EPINEPHRINE 0.3 MG/0.3ML IJ SOAJ: 0.3000 mg | INTRAMUSCULAR | 1 refills | Status: AC | PRN

## 2023-10-26 MED ORDER — FEXOFENADINE HCL 180 MG PO TABS: 360.0000 mg | ORAL_TABLET | Freq: Two times a day (BID) | ORAL | 5 refills | Status: AC | PRN

## 2023-10-26 MED ORDER — MONTELUKAST SODIUM 10 MG PO TABS: 10.0000 mg | ORAL_TABLET | Freq: Every day | ORAL | 5 refills | Status: DC

## 2023-10-26 MED ORDER — FAMOTIDINE 40 MG PO TABS: 40.0000 mg | ORAL_TABLET | Freq: Two times a day (BID) | ORAL | 5 refills | Status: DC | PRN

## 2023-10-26 NOTE — Progress Notes (Signed)
   FOLLOW UP Date of Service/Encounter:  10/26/23   Subjective:  Christopher [name] (DOB: October 23, 1955) is a 68 y.o. male who [facility] for follow up for idiopathic urticaria.   History obtained from: chart review and patient. Last seen on 04/06/2023 and at the time CSU was controlled on Xolair.   Since last visit, reports hives are doing well.  Only has had a few minor outbreaks, short lasting.  Taking Allegra 2 tablets daily, pepcid 2 tablets daily.  Hydroxyzine daily, Singulair daily. Has not tried stopping the hydroxyzine.  On Xolair every month; has an Epipen, no reactions.  Also underwent posterior cervical fusion 09/04/2023 without any major flare up of hives.  Xolair initiated 10/2022.   Past Medical History: Past Medical History:  Diagnosis Date   Anxiety    Arthritis    Asthma    BPH (benign prostatic hyperplasia)    Complication of anesthesia    pt had a hard time being able to move after spinal anesthesia , 3-4 hours   COPD (chronic obstructive pulmonary disease) (HCC)    Depression    Diabetes mellitus without complication (HCC)    GERD (gastroesophageal reflux disease)    Gout    no meds   Headache(784.0)    otc meds prn   Heart murmur    dx as a child, no problems as an adult   History of COVID-19 2024   History of hiatal hernia    Hyperlipidemia    IBS (irritable bowel syndrome)    PONV (postoperative nausea and vomiting)    Sleep apnea    uses CIPAP machine at night   Wears partial dentures    bottom partial    Objective:  BP 130/78   Pulse 84   Temp 98.1 F (36.7 C)   Resp 14   Wt 228 lb 8 oz (103.6 kg)   SpO2 97%   BMI 36.88 kg/m  Body mass index is 36.88 kg/m. Physical Exam: GEN: alert, well developed HEENT: clear conjunctiva, nose with mild inferior turbinate hypertrophy, pink nasal mucosa, clear rhinorrhea, + cobblestoning HEART: regular rate and rhythm, no murmur LUNGS: clear to auscultation  bilaterally, no coughing, unlabored respiration SKIN: no rashes or lesions   Assessment:   1. Idiopathic urticaria     Plan/Recommendations:  Idiopathic Urticaria/Angioedema (Hives/Swelling): - Well controlled. Discussed using hydroxyzine PRN rather than daily.  - Continue Xolair 300mg  every 4 weeks.  Keep Epipen. Xolair initiated 10/2022.  - Continue Allegra (Fexofenadine) 360 mg daily. Can increase to twice daily if hives are worse.  - Continue Famotidine 40 mg daily. Can increase to twice daily if hives are worse.  - Continue Singulair (montelukast) 10mg . - Continue Hydroxyzine 25 mg once at bedtime as needed for hives.  Otherwise do not take it.    Return in about 6 months (around 04/24/2024).  Alesia Morin, MD [facility]

## 2023-10-26 NOTE — Patient Instructions (Addendum)
 Idiopathic Urticaria/Angioedema (Hives/Swelling): - Continue Xolair 300mg  every 4 weeks.  Keep Epipen. - Continue Allegra (Fexofenadine) 360 mg daily. Can increase to twice daily if hives are worse.  - Continue Famotidine 40 mg daily. Can increase to twice daily if hives are worse.  - Continue Singulair (montelukast) 10mg . - Continue Hydroxyzine 25 mg once at bedtime as needed for hives.  Otherwise do not take it.

## 2023-10-27 ENCOUNTER — Encounter: Payer: Self-pay | Admitting: *Deleted

## 2023-10-27 ENCOUNTER — Telehealth: Payer: Self-pay | Admitting: *Deleted

## 2023-10-27 NOTE — Telephone Encounter (Signed)
 Left message for pt to call to schedule yearly lung screening CT. Also mailed letter to pt's home.

## 2023-10-28 ENCOUNTER — Telehealth: Payer: Self-pay

## 2023-10-28 ENCOUNTER — Other Ambulatory Visit: Payer: Self-pay

## 2023-10-28 DIAGNOSIS — F17210 Nicotine dependence, cigarettes, uncomplicated: Secondary | ICD-10-CM

## 2023-10-28 DIAGNOSIS — Z87891 Personal history of nicotine dependence: Secondary | ICD-10-CM

## 2023-10-28 DIAGNOSIS — Z122 Encounter for screening for malignant neoplasm of respiratory organs: Secondary | ICD-10-CM

## 2023-10-28 NOTE — Telephone Encounter (Signed)
.  Lung Cancer Screening Narrative/Criteria Questionnaire (Cigarette Smokers Only- No Cigars/Pipes/vapes)   Christopher [name]   SDMV:11/04/2023 at 9:45am with Orpha Bur      03-May-1956   LDCT: 11/06/2023 at 10:30 am at [facility]    67 y.o.   Phone: [phone]  Lung Screening Narrative (confirm age 106-77 yrs Medicare / 50-80 yrs Private pay insurance)   Insurance information:HTA   Referring Provider:Dixon, MD   This screening involves an initial phone call with a team member from our program. It is called a shared decision making visit. The initial meeting is required by  insurance and Medicare to make sure you understand the program. This appointment takes about 15-20 minutes to complete. You will complete the screening scan at your scheduled date/time.  This scan takes about 5-10 minutes to complete. You can eat and drink normally before and after the scan.  Criteria questions for Lung Cancer Screening:   Are you a current or former smoker? Current Age began smoking: 42   If you are a former smoker, what year did you quit smoking? (within 15 yrs)   To calculate your smoking history, I need an accurate estimate of how many packs of cigarettes you smoked per day and for how many years. (Not just the number of PPD you are now smoking)   Years smoking 49 x Packs per day 1 = Pack years 49   (at least 20 pack yrs)   (Make sure they understand that we need to know how much they have smoked in the past, not just the number of PPD they are smoking now)  Do you have a personal history of cancer?  No    Do you have a family history of cancer? No  Are you coughing up blood?  No  Have you had unexplained weight loss of 15 lbs or more in the last 6 months? No  It looks like you meet all criteria.  When would be a good time for [place] to schedule you for this screening?   Additional information: N/A

## 2023-10-28 NOTE — Telephone Encounter (Signed)
 Returned VM. No answer, LVM to call office and discuss LCS Program.

## 2023-10-29 ENCOUNTER — Telehealth: Payer: Self-pay

## 2023-10-29 NOTE — Telephone Encounter (Signed)
 Patient was advised that dr Durwin Nora is not in the office until Monday , and that we would wait until his appointment on Tuesday to check a1c

## 2023-10-29 NOTE — Telephone Encounter (Signed)
 Copied from CRM [id number]. Topic: Clinical - Request for Lab/Test Order >> Oct 29, 2023 10:33 AM Dimitri Ped wrote: Reason for CRM: patient is requesting or asking will they check his A1c on his appointment 3/4  with Dr. Durwin Nora . Give patient a call concerning if he will have his A1c checked and if so they are requesting can he come in tomorrow to get this done before his appointment .  [phone]

## 2023-11-03 ENCOUNTER — Telehealth: Payer: Self-pay | Admitting: Internal Medicine

## 2023-11-03 ENCOUNTER — Ambulatory Visit: Payer: PPO | Admitting: Internal Medicine

## 2023-11-03 DIAGNOSIS — E1165 Type 2 diabetes mellitus with hyperglycemia: Secondary | ICD-10-CM

## 2023-11-03 NOTE — Telephone Encounter (Signed)
 Left detailed message.

## 2023-11-03 NOTE — Telephone Encounter (Signed)
 Patient wife wanting to know if he can have labs for his A1C even though he is unable to have his appt due to office being closed and they do not have a phone to do virtual. Requesting a call back- please remind them our lab will be closed and will need to go to another [facility]. Thank

## 2023-11-04 ENCOUNTER — Encounter: Payer: PPO | Admitting: Adult Health

## 2023-11-06 ENCOUNTER — Ambulatory Visit (HOSPITAL_COMMUNITY): Payer: PPO

## 2023-11-11 ENCOUNTER — Ambulatory Visit: Payer: PPO | Admitting: Internal Medicine

## 2023-11-11 VITALS — BP 128/64 | HR 66 | Temp 98.6°F | Ht 66.0 in | Wt 228.2 lb

## 2023-11-11 DIAGNOSIS — K219 Gastro-esophageal reflux disease without esophagitis: Secondary | ICD-10-CM | POA: Diagnosis not present

## 2023-11-11 DIAGNOSIS — K580 Irritable bowel syndrome with diarrhea: Secondary | ICD-10-CM

## 2023-11-11 DIAGNOSIS — Z860101 Personal history of adenomatous and serrated colon polyps: Secondary | ICD-10-CM | POA: Diagnosis not present

## 2023-11-11 DIAGNOSIS — D123 Benign neoplasm of transverse colon: Secondary | ICD-10-CM

## 2023-11-11 NOTE — Progress Notes (Signed)
 Primary Care Physician:  Billie Lade, MD Primary Gastroenterologist:  Dr. Marletta Lor  Chief Complaint  Patient presents with   New Patient (Initial Visit)    Pt here for ov before colonoscopy    HPI:   Christopher [name] is a 68 y.o. male who presents to clinic today by referral referral from his PCP Dr. Durwin Nora.  Chronic GERD: Well-controlled on pantoprazole once daily.  Denies any dysphagia odynophagia.  No epigastric or chest pain.  Last EGD: 2021 - - Normal hypopharynx. - Normal esophagus. - Z-line irregular, 40 cm from the incisors. - 2 cm hiatal hernia. - Gastritis. Biopsied. Antral GIM, neg to have H. Pylori. - Normal duodenal bulb and second portion of the duodenum.  IBS: Mixed diarrhea, occasional constipation.  Previously on Bentyl and Levsin no longer taking these medications.  Imodium as needed.  Overall feels like his symptoms are at baseline.  Adenomatous colon polyps: Last colonoscopy 2019 with 5 tubular adenomas removed.  2 prior colonoscopies before this both with polyps removed as well.  No family history of colorectal malignancy.  No melena hematochezia.  No unintentional weight loss.  No abdominal pain.    Past Medical History:  Diagnosis Date   Anxiety    Arthritis    Asthma    BPH (benign prostatic hyperplasia)    Complication of anesthesia    pt had a hard time being able to move after spinal anesthesia , 3-4 hours   COPD (chronic obstructive pulmonary disease) (HCC)    Depression    Diabetes mellitus without complication (HCC)    GERD (gastroesophageal reflux disease)    Gout    no meds   Headache(784.0)    otc meds prn   Heart murmur    dx as a child, no problems as an adult   History of COVID-19 2024   History of hiatal hernia    Hyperlipidemia    IBS (irritable bowel syndrome)    PONV (postoperative nausea and vomiting)    Sleep apnea    uses CIPAP machine at night   Wears partial dentures    bottom partial    Past Surgical  History:  Procedure Laterality Date   ANTERIOR CERVICAL DECOMP/DISCECTOMY FUSION N/A 10/13/2022   Procedure: ANTERIOR CERVICAL DISECTOMY FUSION - CERVICAL THREE-CERVICAL FOUR - CERVICAL FOUR-CERVICAL FIVE REMOVAL OF HARDWARE CERVICAL FIVE-CERVICAL SIX;  Surgeon: Donalee Citrin, MD;  Location: [facility] OR;  Service: Neurosurgery;  Laterality: N/A;   APPENDECTOMY     BACK SURGERY  2002   neck and back fusion   BIOPSY  12/27/2015   Procedure: BIOPSY;  Surgeon: Malissa Hippo, MD;  Location: [facility];  Service: Endoscopy;;  Fundus biopsies and duodenal biopsies   BIOPSY  02/10/2020   Procedure: BIOPSY;  Surgeon: Malissa Hippo, MD;  Location: [facility];  Service: Endoscopy;;  antral   CARDIAC CATHETERIZATION     CARDIAC CATHETERIZATION N/A 09/04/2016   Procedure: Right/Left Heart Cath and Coronary Angiography;  Surgeon: Peter M Swaziland, MD;  Location: [facility] INVASIVE CV LAB;  Service: Cardiovascular;  Laterality: N/A;   CHOLECYSTECTOMY     CHONDROPLASTY  08/15/2011   Procedure: CHONDROPLASTY;  Surgeon: Fuller Canada, MD;  Location: [facility] ORS;  Service: Orthopedics;  Laterality: Left;   COLONOSCOPY  06/27/2011   Procedure: COLONOSCOPY;  Surgeon: Malissa Hippo, MD;  [facility]: [facility];  Service: Endoscopy;  Laterality: N/A;  9:00 / Pt to be here at 9am for 10:45 procedure, benign  polyps removed   COLONOSCOPY N/A 10/05/2014   Procedure: COLONOSCOPY;  Surgeon: Malissa Hippo, MD;  [facility]: [facility];  Service: Endoscopy;  Laterality: N/A;  930   COLONOSCOPY N/A 02/11/2018   Procedure: COLONOSCOPY;  Surgeon: Malissa Hippo, MD;  [facility]: [facility];  Service: Endoscopy;  Laterality: N/A;  830   ESOPHAGOGASTRODUODENOSCOPY N/A 12/27/2015   Procedure: ESOPHAGOGASTRODUODENOSCOPY (EGD);  Surgeon: Malissa Hippo, MD;  [facility];  Service: Endoscopy;  Laterality: N/A;  3:00   ESOPHAGOGASTRODUODENOSCOPY (EGD) WITH PROPOFOL N/A 02/10/2020   Procedure: ESOPHAGOGASTRODUODENOSCOPY  (EGD) WITH PROPOFOL;  Surgeon: Malissa Hippo, MD;  [facility];  Service: Endoscopy;  Laterality: N/A;  155   HERNIA REPAIR  1998   umbilical hernia   JOINT REPLACEMENT  2023   knee and shoulder   KNEE ARTHROSCOPY     left knee   KNEE ARTHROSCOPY     right knee    LUMBAR LAMINECTOMY/DECOMPRESSION MICRODISCECTOMY  09/14/2012   Procedure: LUMBAR LAMINECTOMY/DECOMPRESSION MICRODISCECTOMY 1 LEVEL;  Surgeon: Karn Cassis, MD;  Location: [facility];  Service: Neurosurgery;  Laterality: Right;  Right Lumbar three-four Diskectomy   neck fusion  2005   NECK SURGERY     PATELLA-FEMORAL ARTHROPLASTY Right 02/18/2022   Procedure: RIGHT KNEE PATELLA REPLACEMENT, CEMENTED;  Surgeon: Cammy Copa, MD;  Location: [facility] OR;  Service: Orthopedics;  Laterality: Right;   POLYPECTOMY  02/11/2018   Procedure: POLYPECTOMY;  Surgeon: Malissa Hippo, MD;  [facility];  Service: Endoscopy;;  colon   POSTERIOR CERVICAL FUSION/FORAMINOTOMY N/A 09/04/2023   Procedure: Posterior Cervical Fusion, Cervical Five-Cervical Four and Cervical Three-Cervical Three-Cervical Four;  Surgeon: Donalee Citrin, MD;  Location: [facility] OR;  Service: Neurosurgery;  Laterality: N/A;   REVISION TOTAL SHOULDER TO REVERSE TOTAL SHOULDER Left 12/16/2018   Procedure: REVISION TOTAL SHOULDER TO REVERSE TOTAL SHOULDER;  Surgeon: Cammy Copa, MD;  [facility] OR;  Service: Orthopedics;  Laterality: Left;   SHOULDER ARTHROSCOPY WITH BICEPSTENOTOMY Left 09/23/2013   Procedure: SHOULDER ARTHROSCOPY WITH BICEPSTENOTOMY AND EXTENSIVE DEBRIDEMENT;  Surgeon: Vickki Hearing, MD;  Location: [facility];  Service: Orthopedics;  Laterality: Left;   SHOULDER ARTHROSCOPY WITH ROTATOR CUFF REPAIR Left 05/05/2014   Procedure: SHOULDER ARTHROSCOPY LIMITED DEBRIDEMENT;  Surgeon: Vickki Hearing, MD;  Location: [facility];  Service: Orthopedics;  Laterality: Left;   SHOULDER OPEN ROTATOR CUFF REPAIR Left 05/05/2014   Procedure:  ROTATOR CUFF REPAIR SHOULDER OPEN;  Surgeon: Vickki Hearing, MD;  Location: [facility];  Service: Orthopedics;  Laterality: Left;   SHOULDER OPEN ROTATOR CUFF REPAIR Left 12/16/2018   Procedure: LEFT SHOULDER POSSIBLE SUBSCAPULARIS REPAIR VS. REVISION TO REVERSE TOTAL SHOULDER REPLACEMENT;  Surgeon: Cammy Copa, MD;  Location: [facility] OR;  Service: Orthopedics;  Laterality: Left;   SHOULDER SURGERY Right    Open Mumford procedure   SPINAL FUSION     x2, 2003 and 2007   TOTAL KNEE ARTHROPLASTY Left 06/16/2017   Procedure: LEFT TOTAL KNEE ARTHROPLASTY;  Surgeon: Vickki Hearing, MD;  Location: [facility];  Service: Orthopedics;  Laterality: Left;   TOTAL KNEE ARTHROPLASTY Right 05/14/2021   Procedure: TOTAL KNEE ARTHROPLASTY;  Surgeon: Vickki Hearing, MD;  Location: [facility];  Service: Orthopedics;  Laterality: Right;   TOTAL SHOULDER ARTHROPLASTY Left 08/19/2018   Procedure: left shoulder replacement;  Surgeon: Cammy Copa, MD;  Location: [facility] OR;  Service: Orthopedics;  Laterality: Left;    Current Outpatient Medications  Medication Sig Dispense Refill   albuterol (VENTOLIN HFA) 108 (90 Base) MCG/ACT inhaler inhale 2 puffs into lungs every 6 hours as needed for wheezing or shortness of breath 6.7 g 0   Blood Glucose Monitoring Suppl DEVI 1 each by Does not apply route in the morning, at noon, and at bedtime. May substitute to any manufacturer covered by patient's insurance. 1 each 0   cyclobenzaprine (FLEXERIL) 10 MG tablet Take 1 tablet (10 mg total) by mouth 3 (three) times daily as needed for muscle spasms. 30 tablet 3   EPINEPHrine 0.3 mg/0.3 mL IJ SOAJ injection Inject 0.3 mg into the muscle as needed for anaphylaxis. 2 each 1   famotidine (PEPCID) 40 MG tablet Take 1 tablet (40 mg total) by mouth 2 (two) times daily as needed (hives). 60 tablet 5   fexofenadine (ALLEGRA ALLERGY) 180 MG tablet Take 2 tablets (360 mg total) by mouth 2 (two) times daily as needed (hives). 60  tablet 5   gabapentin (NEURONTIN) 300 MG capsule Take 300 mg by mouth 3 (three) times daily.     GLOBAL EASE INJECT PEN NEEDLES 31G X 5 MM MISC Inject 1 Syringe into the skin daily. 100 each 0   glucose blood (ACCU-CHEK GUIDE TEST) test strip Use as instructed 100 strip 0   hydrOXYzine (VISTARIL) 25 MG capsule Take 25 to 50 mg once at bedtime as needed. 30 capsule 1   insulin glargine (LANTUS SOLOSTAR) 100 UNIT/ML Solostar Pen Inject 20 Units into the skin at bedtime. 15 mL 2   meloxicam (MOBIC) 7.5 MG tablet Take 7.5 mg by mouth 2 (two) times daily.     Menthol, Topical Analgesic, (FREEZE IT FAST PAIN RELIEF EX) Apply 1 Application topically daily. 10%     montelukast (SINGULAIR) 10 MG tablet Take 1 tablet (10 mg total) by mouth at bedtime. 30 tablet 5   nitroGLYCERIN (NITROSTAT) 0.4 MG SL tablet PLACE (1) TABLET UNDER TONGUE EVERY 5 MINUTES UP TO (3) DOSES. IF NO RELIEF CALL 911. 25 tablet 3   Omalizumab (XOLAIR [place]) Inject 2 application  into the skin every 30 (thirty) days. Given at Dr Isidore Moos by injection     ondansetron (ZOFRAN) 4 MG tablet Take 1 tablet (4 mg total) by mouth every 8 (eight) hours as needed for nausea. 20 tablet 0   pantoprazole (PROTONIX) 40 MG tablet TAKE (1) TABLET TWICE A DAY BEFORE MEALS. (Patient taking differently: Take 40 mg by mouth daily.) 60 tablet 0   rosuvastatin (CRESTOR) 20 MG tablet take 1 tablet by mouth once daily. 30 tablet 0   sertraline (ZOLOFT) 50 MG tablet Take 1 tablet (50 mg total) by mouth in the morning. 90 tablet 2   Testosterone Undecanoate (JATENZO) 237 MG CAPS Take 1 capsule (237 mg total) by mouth in the morning and at bedtime. (Patient taking differently: Take by mouth in the morning and at bedtime.) 60 capsule 5   Tiotropium Bromide-Olodaterol (STIOLTO RESPIMAT) 2.5-2.5 MCG/ACT AERS Inhale 2 puffs into the lungs daily. 1 each 5   HYDROcodone-acetaminophen (NORCO) 7.5-325 MG tablet Take 1-2 tablets by mouth every 4 (four) hours as needed for  severe pain (pain score 7-10) or moderate pain (pain score 4-6). (Patient not taking: Reported on 11/11/2023) 40 tablet 0   Lancets (ONETOUCH DELICA PLUS LANCET33G) MISC 1 each by Does not apply route in the morning, at noon, and at bedtime. (Patient not taking: Reported on 11/11/2023) 100 each 0   metFORMIN (GLUCOPHAGE-XR) 500 MG 24 hr  tablet Take 2 tablets (1,000 mg total) by mouth 2 (two) times daily with a meal. (Patient not taking: Reported on 11/11/2023) 360 tablet 1   Current Facility-Administered Medications  Medication Dose Route Frequency Provider Last Rate Last Admin   omalizumab Geoffry Paradise) prefilled syringe 300 mg  300 mg Subcutaneous Q28 days Alfonse Spruce, MD   300 mg at 10/26/23 1349    Allergies as of 11/11/2023 - Review Complete 11/11/2023  Allergen Reaction Noted   Celebrex [celecoxib] Itching and Swelling 09/02/2012   Codeine Nausea And Vomiting and Other (See Comments) 01/19/2023   Cortisone Swelling 09/14/2012   Doxycycline Swelling and Other (See Comments)    Medrol [methylprednisolone] Hives and Swelling 10/26/2022   Prednisone Swelling    Relafen [nabumetone] Swelling 08/04/2011   Aspirin Other (See Comments) 07/11/2021   Oxycodone-acetaminophen Itching and Other (See Comments)    Raloxifene Rash 01/19/2023   Rofecoxib Rash 01/19/2023    Family History  Problem Relation Age of Onset   Diabetes Mother    Alzheimer's disease Father    Diabetes Father    Diabetes Other    Lung disease Other    Arthritis Other    Anesthesia problems Neg Hx    Hypotension Neg Hx    Malignant hyperthermia Neg Hx    Pseudochol deficiency Neg Hx    Sleep apnea Neg Hx     Social History   Socioeconomic History   Marital status: Married    Spouse name: Not on file   Number of children: Not on file   Years of education: Not on file   Highest education level: Not on file  Occupational History   Occupation: Therapist, music    Employer: DISABLED  Tobacco Use   Smoking  status: Former    Current packs/day: 0.00    Average packs/day: 1 pack/day for 44.0 years (44.0 ttl pk-yrs)    Types: Cigarettes    Start date: 08/25/1978    Quit date: 08/25/2022    Years since quitting: 1.2    Passive exposure: Current   Smokeless tobacco: Never   Tobacco comments:    smokes a pack a day. since age 53  Vaping Use   Vaping status: Never Used  Substance and Sexual Activity   Alcohol use: Never   Drug use: Never   Sexual activity: Yes  Other Topics Concern   Not on file  Social History Narrative   Lives at home with wife   Right handed   Caffeine: 2 cups of coffee, 2 cans of soda, 4-5 cups of tea daily.   Social Drivers of Corporate investment banker Strain: Low Risk  (10/20/2023)   Overall Financial Resource Strain (CARDIA)    Difficulty of Paying Living Expenses: Not hard at all  Food Insecurity: No Food Insecurity (10/20/2023)   Hunger Vital Sign    Worried About Running Out of Food in the Last Year: Never true    Ran Out of Food in the Last Year: Never true  Transportation Needs: No Transportation Needs (10/20/2023)   PRAPARE - Administrator, Civil Service (Medical): No    Lack of Transportation (Non-Medical): No  Physical Activity: Inactive (10/20/2023)   Exercise Vital Sign    Days of Exercise per Week: 0 days    Minutes of Exercise per Session: 0 min  Stress: No Stress Concern Present (10/20/2023)   [employer] of Occupational Health - Occupational Stress Questionnaire    Feeling of Stress : Not  at all  Social Connections: Socially Isolated (10/20/2023)   Social Connection and Isolation Panel [NHANES]    Frequency of Communication with Friends and Family: Once a week    Frequency of Social Gatherings with Friends and Family: Once a week    Attends Religious Services: Never    Database administrator or Organizations: No    Attends Banker Meetings: Never    Marital Status: Married  Catering manager Violence: Not At  Risk (10/20/2023)   Humiliation, Afraid, Rape, and Kick questionnaire    Fear of Current or Ex-Partner: No    Emotionally Abused: No    Physically Abused: No    Sexually Abused: No    Subjective: Review of Systems  Constitutional:  Negative for chills and fever.  HENT:  Negative for congestion and hearing loss.   Eyes:  Negative for blurred vision and double vision.  Respiratory:  Negative for cough and shortness of breath.   Cardiovascular:  Negative for chest pain and palpitations.  Gastrointestinal:  Negative for abdominal pain, blood in stool, constipation, diarrhea, heartburn, melena and vomiting.  Genitourinary:  Negative for dysuria and urgency.  Musculoskeletal:  Negative for joint pain and myalgias.  Skin:  Negative for itching and rash.  Neurological:  Negative for dizziness and headaches.  Psychiatric/Behavioral:  Negative for depression. The patient is not nervous/anxious.        Objective: BP 128/64   Pulse 66   Temp 98.6 F (37 C)   Ht 5\' 6"  (1.676 m)   Wt 228 lb 3.2 oz (103.5 kg)   BMI 36.83 kg/m  Physical Exam Constitutional:      Appearance: Normal appearance.  HENT:     Head: Normocephalic and atraumatic.  Eyes:     Extraocular Movements: Extraocular movements intact.     Conjunctiva/sclera: Conjunctivae normal.  Cardiovascular:     Rate and Rhythm: Normal rate and regular rhythm.  Pulmonary:     Effort: Pulmonary effort is normal.     Breath sounds: Normal breath sounds.  Abdominal:     General: Bowel sounds are normal.     Palpations: Abdomen is soft.  Musculoskeletal:        General: Normal range of motion.     Cervical back: Normal range of motion and neck supple.  Skin:    General: Skin is warm.  Neurological:     General: No focal deficit present.     Mental Status: He is alert and oriented to person, place, and time.  Psychiatric:        Mood and Affect: Mood normal.        Behavior: Behavior normal.      Assessment/Plan:  1.   Chronic GERD-well-controlled on pantoprazole daily.  Will continue.  2.  IBS-mixed, mainly diarrhea, occasional constipation-at baseline.  Continue Imodium as needed.  3.  History of adenomatous colon polyps- Will schedule for surveillance colonoscopy.The risks including infection, bleed, or perforation as well as benefits, limitations, alternatives and imponderables have been reviewed with the patient. Questions have been answered. All parties agreeable.  Thank you Dr. Durwin Nora for the kind referral.  11/11/2023 2:16 PM   Disclaimer: This note was dictated with voice recognition software. Similar sounding words can inadvertently be transcribed and may not be corrected upon review.

## 2023-11-11 NOTE — Patient Instructions (Signed)
 We will schedule you for colonoscopy given your history of polyps prior.  It was very nice meeting you today.  Dr. Marletta Lor

## 2023-11-11 NOTE — H&P (View-Only) (Signed)
 Primary Care Physician:  Billie Lade, MD Primary Gastroenterologist:  Dr. Marletta Lor  Chief Complaint  Patient presents with   New Patient (Initial Visit)    Pt here for ov before colonoscopy    HPI:   Christopher [name] is a 68 y.o. male who presents to clinic today by referral referral from his PCP Dr. Durwin Nora.  Chronic GERD: Well-controlled on pantoprazole once daily.  Denies any dysphagia odynophagia.  No epigastric or chest pain.  Last EGD: 2021 - - Normal hypopharynx. - Normal esophagus. - Z-line irregular, 40 cm from the incisors. - 2 cm hiatal hernia. - Gastritis. Biopsied. Antral GIM, neg to have H. Pylori. - Normal duodenal bulb and second portion of the duodenum.  IBS: Mixed diarrhea, occasional constipation.  Previously on Bentyl and Levsin no longer taking these medications.  Imodium as needed.  Overall feels like his symptoms are at baseline.  Adenomatous colon polyps: Last colonoscopy 2019 with 5 tubular adenomas removed.  2 prior colonoscopies before this both with polyps removed as well.  No family history of colorectal malignancy.  No melena hematochezia.  No unintentional weight loss.  No abdominal pain.    Past Medical History:  Diagnosis Date   Anxiety    Arthritis    Asthma    BPH (benign prostatic hyperplasia)    Complication of anesthesia    pt had a hard time being able to move after spinal anesthesia , 3-4 hours   COPD (chronic obstructive pulmonary disease) (HCC)    Depression    Diabetes mellitus without complication (HCC)    GERD (gastroesophageal reflux disease)    Gout    no meds   Headache(784.0)    otc meds prn   Heart murmur    dx as a child, no problems as an adult   History of COVID-19 2024   History of hiatal hernia    Hyperlipidemia    IBS (irritable bowel syndrome)    PONV (postoperative nausea and vomiting)    Sleep apnea    uses CIPAP machine at night   Wears partial dentures    bottom partial    Past Surgical  History:  Procedure Laterality Date   ANTERIOR CERVICAL DECOMP/DISCECTOMY FUSION N/A 10/13/2022   Procedure: ANTERIOR CERVICAL DISECTOMY FUSION - CERVICAL THREE-CERVICAL FOUR - CERVICAL FOUR-CERVICAL FIVE REMOVAL OF HARDWARE CERVICAL FIVE-CERVICAL SIX;  Surgeon: Donalee Citrin, MD;  Location: [facility];  Service: Neurosurgery;  Laterality: N/A;   APPENDECTOMY     BACK SURGERY  2002   neck and back fusion   BIOPSY  12/27/2015   Procedure: BIOPSY;  Surgeon: Malissa Hippo, MD;  [facility]: [facility];  Service: Endoscopy;;  Fundus biopsies and duodenal biopsies   BIOPSY  02/10/2020   Procedure: BIOPSY;  Surgeon: Malissa Hippo, MD;  [facility];  Service: Endoscopy;;  antral   CARDIAC CATHETERIZATION     CARDIAC CATHETERIZATION N/A 09/04/2016   Procedure: Right/Left Heart Cath and Coronary Angiography;  Surgeon: Peter M Swaziland, MD;  Location: [facility] INVASIVE CV LAB;  Service: Cardiovascular;  Laterality: N/A;   CHOLECYSTECTOMY     CHONDROPLASTY  08/15/2011   Procedure: CHONDROPLASTY;  Surgeon: Fuller Canada, MD;  Location: [facility];  Service: Orthopedics;  Laterality: Left;   COLONOSCOPY  06/27/2011   Procedure: COLONOSCOPY;  Surgeon: Malissa Hippo, MD;  [facility];  Service: Endoscopy;  Laterality: N/A;  9:00 / Pt to be here at 9am for 10:45 procedure, benign  polyps removed   COLONOSCOPY N/A 10/05/2014   Procedure: COLONOSCOPY;  Surgeon: Malissa Hippo, MD;  [facility];  Service: Endoscopy;  Laterality: N/A;  930   COLONOSCOPY N/A 02/11/2018   Procedure: COLONOSCOPY;  Surgeon: Malissa Hippo, MD;  [facility];  Service: Endoscopy;  Laterality: N/A;  830   ESOPHAGOGASTRODUODENOSCOPY N/A 12/27/2015   Procedure: ESOPHAGOGASTRODUODENOSCOPY (EGD);  Surgeon: Malissa Hippo, MD;  [facility];  Service: Endoscopy;  Laterality: N/A;  3:00   ESOPHAGOGASTRODUODENOSCOPY (EGD) WITH PROPOFOL N/A 02/10/2020   Procedure: ESOPHAGOGASTRODUODENOSCOPY  (EGD) WITH PROPOFOL;  Surgeon: Malissa Hippo, MD;  [facility];  Service: Endoscopy;  Laterality: N/A;  155   HERNIA REPAIR  1998   umbilical hernia   JOINT REPLACEMENT  2023   knee and shoulder   KNEE ARTHROSCOPY     left knee   KNEE ARTHROSCOPY     right knee    LUMBAR LAMINECTOMY/DECOMPRESSION MICRODISCECTOMY  09/14/2012   Procedure: LUMBAR LAMINECTOMY/DECOMPRESSION MICRODISCECTOMY 1 LEVEL;  Surgeon: Karn Cassis, MD;  [facility]: [facility];  Service: Neurosurgery;  Laterality: Right;  Right Lumbar three-four Diskectomy   neck fusion  2005   NECK SURGERY     PATELLA-FEMORAL ARTHROPLASTY Right 02/18/2022   Procedure: RIGHT KNEE PATELLA REPLACEMENT, CEMENTED;  Surgeon: Cammy Copa, MD;  Location: [facility] OR;  Service: Orthopedics;  Laterality: Right;   POLYPECTOMY  02/11/2018   Procedure: POLYPECTOMY;  Surgeon: Malissa Hippo, MD;  [facility];  Service: Endoscopy;;  colon   POSTERIOR CERVICAL FUSION/FORAMINOTOMY N/A 09/04/2023   Procedure: Posterior Cervical Fusion, Cervical Five-Cervical Four and Cervical Three-Cervical Three-Cervical Four;  Surgeon: Donalee Citrin, MD;  Location: [facility] OR;  Service: Neurosurgery;  Laterality: N/A;   REVISION TOTAL SHOULDER TO REVERSE TOTAL SHOULDER Left 12/16/2018   Procedure: REVISION TOTAL SHOULDER TO REVERSE TOTAL SHOULDER;  Surgeon: Cammy Copa, MD;  [facility]: [facility] OR;  Service: Orthopedics;  Laterality: Left;   SHOULDER ARTHROSCOPY WITH BICEPSTENOTOMY Left 09/23/2013   Procedure: SHOULDER ARTHROSCOPY WITH BICEPSTENOTOMY AND EXTENSIVE DEBRIDEMENT;  Surgeon: Vickki Hearing, MD;  Location: [facility];  Service: Orthopedics;  Laterality: Left;   SHOULDER ARTHROSCOPY WITH ROTATOR CUFF REPAIR Left 05/05/2014   Procedure: SHOULDER ARTHROSCOPY LIMITED DEBRIDEMENT;  Surgeon: Vickki Hearing, MD;  Location: [facility];  Service: Orthopedics;  Laterality: Left;   SHOULDER OPEN ROTATOR CUFF REPAIR Left 05/05/2014   Procedure:  ROTATOR CUFF REPAIR SHOULDER OPEN;  Surgeon: Vickki Hearing, MD;  Location: [facility];  Service: Orthopedics;  Laterality: Left;   SHOULDER OPEN ROTATOR CUFF REPAIR Left 12/16/2018   Procedure: LEFT SHOULDER POSSIBLE SUBSCAPULARIS REPAIR VS. REVISION TO REVERSE TOTAL SHOULDER REPLACEMENT;  Surgeon: Cammy Copa, MD;  Location: [facility] OR;  Service: Orthopedics;  Laterality: Left;   SHOULDER SURGERY Right    Open Mumford procedure   SPINAL FUSION     x2, 2003 and 2007   TOTAL KNEE ARTHROPLASTY Left 06/16/2017   Procedure: LEFT TOTAL KNEE ARTHROPLASTY;  Surgeon: Vickki Hearing, MD;  [facility]: [facility];  Service: Orthopedics;  Laterality: Left;   TOTAL KNEE ARTHROPLASTY Right 05/14/2021   Procedure: TOTAL KNEE ARTHROPLASTY;  Surgeon: Vickki Hearing, MD;  Location: [facility];  Service: Orthopedics;  Laterality: Right;   TOTAL SHOULDER ARTHROPLASTY Left 08/19/2018   Procedure: left shoulder replacement;  Surgeon: Cammy Copa, MD;  Location: [facility] OR;  Service: Orthopedics;  Laterality: Left;    Current Outpatient Medications  Medication Sig Dispense Refill   albuterol (VENTOLIN HFA) 108 (90 Base) MCG/ACT inhaler inhale 2 puffs into lungs every 6 hours as needed for wheezing or shortness of breath 6.7 g 0   Blood Glucose Monitoring Suppl DEVI 1 each by Does not apply route in the morning, at noon, and at bedtime. May substitute to any manufacturer covered by patient's insurance. 1 each 0   cyclobenzaprine (FLEXERIL) 10 MG tablet Take 1 tablet (10 mg total) by mouth 3 (three) times daily as needed for muscle spasms. 30 tablet 3   EPINEPHrine 0.3 mg/0.3 mL IJ SOAJ injection Inject 0.3 mg into the muscle as needed for anaphylaxis. 2 each 1   famotidine (PEPCID) 40 MG tablet Take 1 tablet (40 mg total) by mouth 2 (two) times daily as needed (hives). 60 tablet 5   fexofenadine (ALLEGRA ALLERGY) 180 MG tablet Take 2 tablets (360 mg total) by mouth 2 (two) times daily as needed (hives). 60  tablet 5   gabapentin (NEURONTIN) 300 MG capsule Take 300 mg by mouth 3 (three) times daily.     GLOBAL EASE INJECT PEN NEEDLES 31G X 5 MM MISC Inject 1 Syringe into the skin daily. 100 each 0   glucose blood (ACCU-CHEK GUIDE TEST) test strip Use as instructed 100 strip 0   hydrOXYzine (VISTARIL) 25 MG capsule Take 25 to 50 mg once at bedtime as needed. 30 capsule 1   insulin glargine (LANTUS SOLOSTAR) 100 UNIT/ML Solostar Pen Inject 20 Units into the skin at bedtime. 15 mL 2   meloxicam (MOBIC) 7.5 MG tablet Take 7.5 mg by mouth 2 (two) times daily.     Menthol, Topical Analgesic, (FREEZE IT FAST PAIN RELIEF EX) Apply 1 Application topically daily. 10%     montelukast (SINGULAIR) 10 MG tablet Take 1 tablet (10 mg total) by mouth at bedtime. 30 tablet 5   nitroGLYCERIN (NITROSTAT) 0.4 MG SL tablet PLACE (1) TABLET UNDER TONGUE EVERY 5 MINUTES UP TO (3) DOSES. IF NO RELIEF CALL 911. 25 tablet 3   Omalizumab (XOLAIR [place]) Inject 2 application  into the skin every 30 (thirty) days. Given at Dr Isidore Moos by injection     ondansetron (ZOFRAN) 4 MG tablet Take 1 tablet (4 mg total) by mouth every 8 (eight) hours as needed for nausea. 20 tablet 0   pantoprazole (PROTONIX) 40 MG tablet TAKE (1) TABLET TWICE A DAY BEFORE MEALS. (Patient taking differently: Take 40 mg by mouth daily.) 60 tablet 0   rosuvastatin (CRESTOR) 20 MG tablet take 1 tablet by mouth once daily. 30 tablet 0   sertraline (ZOLOFT) 50 MG tablet Take 1 tablet (50 mg total) by mouth in the morning. 90 tablet 2   Testosterone Undecanoate (JATENZO) 237 MG CAPS Take 1 capsule (237 mg total) by mouth in the morning and at bedtime. (Patient taking differently: Take by mouth in the morning and at bedtime.) 60 capsule 5   Tiotropium Bromide-Olodaterol (STIOLTO RESPIMAT) 2.5-2.5 MCG/ACT AERS Inhale 2 puffs into the lungs daily. 1 each 5   HYDROcodone-acetaminophen (NORCO) 7.5-325 MG tablet Take 1-2 tablets by mouth every 4 (four) hours as needed for  severe pain (pain score 7-10) or moderate pain (pain score 4-6). (Patient not taking: Reported on 11/11/2023) 40 tablet 0   Lancets (ONETOUCH DELICA PLUS LANCET33G) MISC 1 each by Does not apply route in the morning, at noon, and at bedtime. (Patient not taking: Reported on 11/11/2023) 100 each 0   metFORMIN (GLUCOPHAGE-XR) 500 MG 24 hr  tablet Take 2 tablets (1,000 mg total) by mouth 2 (two) times daily with a meal. (Patient not taking: Reported on 11/11/2023) 360 tablet 1   Current Facility-Administered Medications  Medication Dose Route Frequency Provider Last Rate Last Admin   omalizumab Geoffry Paradise) prefilled syringe 300 mg  300 mg Subcutaneous Q28 days Alfonse Spruce, MD   300 mg at 10/26/23 1349    Allergies as of 11/11/2023 - Review Complete 11/11/2023  Allergen Reaction Noted   Celebrex [celecoxib] Itching and Swelling 09/02/2012   Codeine Nausea And Vomiting and Other (See Comments) 01/19/2023   Cortisone Swelling 09/14/2012   Doxycycline Swelling and Other (See Comments)    Medrol [methylprednisolone] Hives and Swelling 10/26/2022   Prednisone Swelling    Relafen [nabumetone] Swelling 08/04/2011   Aspirin Other (See Comments) 07/11/2021   Oxycodone-acetaminophen Itching and Other (See Comments)    Raloxifene Rash 01/19/2023   Rofecoxib Rash 01/19/2023    Family History  Problem Relation Age of Onset   Diabetes Mother    Alzheimer's disease Father    Diabetes Father    Diabetes Other    Lung disease Other    Arthritis Other    Anesthesia problems Neg Hx    Hypotension Neg Hx    Malignant hyperthermia Neg Hx    Pseudochol deficiency Neg Hx    Sleep apnea Neg Hx     Social History   Socioeconomic History   Marital status: Married    Spouse name: Not on file   Number of children: Not on file   Years of education: Not on file   Highest education level: Not on file  Occupational History   Occupation: Therapist, music    Employer: DISABLED  Tobacco Use   Smoking  status: Former    Current packs/day: 0.00    Average packs/day: 1 pack/day for 44.0 years (44.0 ttl pk-yrs)    Types: Cigarettes    Start date: 08/25/1978    Quit date: 08/25/2022    Years since quitting: 1.2    Passive exposure: Current   Smokeless tobacco: Never   Tobacco comments:    smokes a pack a day. since age 53  Vaping Use   Vaping status: Never Used  Substance and Sexual Activity   Alcohol use: Never   Drug use: Never   Sexual activity: Yes  Other Topics Concern   Not on file  Social History Narrative   Lives at home with wife   Right handed   Caffeine: 2 cups of coffee, 2 cans of soda, 4-5 cups of tea daily.   Social Drivers of Corporate investment banker Strain: Low Risk  (10/20/2023)   Overall Financial Resource Strain (CARDIA)    Difficulty of Paying Living Expenses: Not hard at all  Food Insecurity: No Food Insecurity (10/20/2023)   Hunger Vital Sign    Worried About Running Out of Food in the Last Year: Never true    Ran Out of Food in the Last Year: Never true  Transportation Needs: No Transportation Needs (10/20/2023)   PRAPARE - Administrator, Civil Service (Medical): No    Lack of Transportation (Non-Medical): No  Physical Activity: Inactive (10/20/2023)   Exercise Vital Sign    Days of Exercise per Week: 0 days    Minutes of Exercise per Session: 0 min  Stress: No Stress Concern Present (10/20/2023)   [employer] of Occupational Health - Occupational Stress Questionnaire    Feeling of Stress : Not  at all  Social Connections: Socially Isolated (10/20/2023)   Social Connection and Isolation Panel [NHANES]    Frequency of Communication with Friends and Family: Once a week    Frequency of Social Gatherings with Friends and Family: Once a week    Attends Religious Services: Never    Database administrator or Organizations: No    Attends Banker Meetings: Never    Marital Status: Married  Catering manager Violence: Not At  Risk (10/20/2023)   Humiliation, Afraid, Rape, and Kick questionnaire    Fear of Current or Ex-Partner: No    Emotionally Abused: No    Physically Abused: No    Sexually Abused: No    Subjective: Review of Systems  Constitutional:  Negative for chills and fever.  HENT:  Negative for congestion and hearing loss.   Eyes:  Negative for blurred vision and double vision.  Respiratory:  Negative for cough and shortness of breath.   Cardiovascular:  Negative for chest pain and palpitations.  Gastrointestinal:  Negative for abdominal pain, blood in stool, constipation, diarrhea, heartburn, melena and vomiting.  Genitourinary:  Negative for dysuria and urgency.  Musculoskeletal:  Negative for joint pain and myalgias.  Skin:  Negative for itching and rash.  Neurological:  Negative for dizziness and headaches.  Psychiatric/Behavioral:  Negative for depression. The patient is not nervous/anxious.        Objective: BP 128/64   Pulse 66   Temp 98.6 F (37 C)   Ht 5\' 6"  (1.676 m)   Wt 228 lb 3.2 oz (103.5 kg)   BMI 36.83 kg/m  Physical Exam Constitutional:      Appearance: Normal appearance.  HENT:     Head: Normocephalic and atraumatic.  Eyes:     Extraocular Movements: Extraocular movements intact.     Conjunctiva/sclera: Conjunctivae normal.  Cardiovascular:     Rate and Rhythm: Normal rate and regular rhythm.  Pulmonary:     Effort: Pulmonary effort is normal.     Breath sounds: Normal breath sounds.  Abdominal:     General: Bowel sounds are normal.     Palpations: Abdomen is soft.  Musculoskeletal:        General: Normal range of motion.     Cervical back: Normal range of motion and neck supple.  Skin:    General: Skin is warm.  Neurological:     General: No focal deficit present.     Mental Status: He is alert and oriented to person, place, and time.  Psychiatric:        Mood and Affect: Mood normal.        Behavior: Behavior normal.      Assessment/Plan:  1.   Chronic GERD-well-controlled on pantoprazole daily.  Will continue.  2.  IBS-mixed, mainly diarrhea, occasional constipation-at baseline.  Continue Imodium as needed.  3.  History of adenomatous colon polyps- Will schedule for surveillance colonoscopy.The risks including infection, bleed, or perforation as well as benefits, limitations, alternatives and imponderables have been reviewed with the patient. Questions have been answered. All parties agreeable.  Thank you Dr. Durwin Nora for the kind referral.  11/11/2023 2:16 PM   Disclaimer: This note was dictated with voice recognition software. Similar sounding words can inadvertently be transcribed and may not be corrected upon review.

## 2023-11-12 ENCOUNTER — Other Ambulatory Visit: Payer: Self-pay | Admitting: *Deleted

## 2023-11-12 ENCOUNTER — Encounter: Payer: Self-pay | Admitting: Internal Medicine

## 2023-11-12 ENCOUNTER — Telehealth: Payer: Self-pay | Admitting: *Deleted

## 2023-11-12 ENCOUNTER — Encounter: Payer: Self-pay | Admitting: *Deleted

## 2023-11-12 ENCOUNTER — Ambulatory Visit (HOSPITAL_COMMUNITY)
Admission: RE | Admit: 2023-11-12 | Discharge: 2023-11-12 | Disposition: A | Discharge status: Home / Self Care | Source: Ambulatory Visit | Attending: Acute Care | Admitting: Acute Care

## 2023-11-12 DIAGNOSIS — Z87891 Personal history of nicotine dependence: Secondary | ICD-10-CM | POA: Insufficient documentation

## 2023-11-12 DIAGNOSIS — Z122 Encounter for screening for malignant neoplasm of respiratory organs: Secondary | ICD-10-CM | POA: Diagnosis present

## 2023-11-12 DIAGNOSIS — F17210 Nicotine dependence, cigarettes, uncomplicated: Secondary | ICD-10-CM | POA: Diagnosis present

## 2023-11-12 MED ORDER — PEG 3350-KCL-NA BICARB-NACL 420 G PO SOLR: 4000.0000 mL | ORAL | 0 refills | Status: DC

## 2023-11-12 NOTE — Telephone Encounter (Signed)
 Spoke with pt spouse and she is aware of pre-op appt details

## 2023-11-16 ENCOUNTER — Other Ambulatory Visit: Payer: Self-pay | Admitting: Internal Medicine

## 2023-11-16 DIAGNOSIS — E782 Mixed hyperlipidemia: Secondary | ICD-10-CM

## 2023-11-16 MED ORDER — ROSUVASTATIN CALCIUM 20 MG PO TABS: 20.0000 mg | ORAL_TABLET | Freq: Every day | ORAL | 0 refills | Status: DC

## 2023-11-16 NOTE — Telephone Encounter (Signed)
 Copied from CRM [id number]. Topic: Clinical - Medication Refill >> Nov 16, 2023 11:14 AM Ernst Spell wrote: Most Recent Primary Care Visit:  Provider: Recardo Evangelist A  Department: [facility]  Visit Type: ANNUAL WELL VISIT, SEQUENTIAL  Date: 10/20/2023  Medication: rosuvastatin 20 mg  Has the patient contacted their pharmacy? Yes Using new pharmacy, switching meds to [facility].   Is this the correct pharmacy for this prescription? Yes If no, delete pharmacy and type the correct one.  This is the patient's preferred pharmacy:  [facility] [place], [place] - [id number] [place] [address] Phone: [phone] Fax: [phone]  [facility] - [place], [place] - [address] [place] [address] Phone: [phone] Fax: [phone]  [id number] - [place], [place] - [address] AT [facility] [address] [place] [address] Phone: [phone] Fax: [phone]   Has the prescription been filled recently? No  Is the patient out of the medication? Yes  Has the patient been seen for an appointment in the last year OR does the patient have an upcoming appointment? Yes  Can we respond through MyChart? No  Agent: Please be advised that Rx refills may take up to 3 business days. We ask that you follow-up with your pharmacy.

## 2023-11-23 ENCOUNTER — Ambulatory Visit: Payer: PPO

## 2023-11-23 DIAGNOSIS — L501 Idiopathic urticaria: Secondary | ICD-10-CM | POA: Diagnosis not present

## 2023-11-24 LAB — CBC WITH DIFFERENTIAL/PLATELET
Basophils Absolute: 0 10*3/uL (ref 0.0–0.2)
Basos: 1 %
EOS (ABSOLUTE): 0.1 10*3/uL (ref 0.0–0.4)
Eos: 1 %
Hematocrit: 41.6 % (ref 37.5–51.0)
Hemoglobin: 14.2 g/dL (ref 13.0–17.7)
Immature Grans (Abs): 0 10*3/uL (ref 0.0–0.1)
Immature Granulocytes: 0 %
Lymphocytes Absolute: 2.2 10*3/uL (ref 0.7–3.1)
Lymphs: 29 %
MCH: 28.1 pg (ref 26.6–33.0)
MCHC: 34.1 g/dL (ref 31.5–35.7)
MCV: 82 fL (ref 79–97)
Monocytes Absolute: 0.5 10*3/uL (ref 0.1–0.9)
Monocytes: 6 %
Neutrophils Absolute: 4.8 10*3/uL (ref 1.4–7.0)
Neutrophils: 63 %
Platelets: 251 10*3/uL (ref 150–450)
RBC: 5.05 x10E6/uL (ref 4.14–5.80)
RDW: 14.5 % (ref 11.6–15.4)
WBC: 7.6 10*3/uL (ref 3.4–10.8)

## 2023-11-24 LAB — CMP14+EGFR
ALT: 20 IU/L (ref 0–44)
AST: 19 IU/L (ref 0–40)
Albumin: 4.5 g/dL (ref 3.9–4.9)
Alkaline Phosphatase: 85 IU/L (ref 44–121)
BUN/Creatinine Ratio: 12 (ref 10–24)
BUN: 12 mg/dL (ref 8–27)
Bilirubin Total: 0.2 mg/dL (ref 0.0–1.2)
CO2: 24 mmol/L (ref 20–29)
Calcium: 9.3 mg/dL (ref 8.6–10.2)
Chloride: 104 mmol/L (ref 96–106)
Creatinine, Ser: 1.01 mg/dL (ref 0.76–1.27)
Globulin, Total: 2 g/dL (ref 1.5–4.5)
Glucose: 94 mg/dL (ref 70–99)
Potassium: 4.7 mmol/L (ref 3.5–5.2)
Sodium: 143 mmol/L (ref 134–144)
Total Protein: 6.5 g/dL (ref 6.0–8.5)
eGFR: 83 mL/min/{1.73_m2} (ref >59)

## 2023-11-24 LAB — HEMOGLOBIN A1C
Est. average glucose Bld gHb Est-mCnc: 166 mg/dL
Hgb A1c MFr Bld: 7.4 % — ABNORMAL HIGH (ref 4.8–5.6)

## 2023-11-24 NOTE — Telephone Encounter (Signed)
 [name] Barbette Or (on dpr) called and stated that pt is on Ozempic and he is supposed to take it today. Advised her that pt needs to hold the medication until after procedure. She also questioned the amount of simethicone drops to put in the prep. Advised her that per instructions 20 ccs/mls to be added. Verbalized understanding.

## 2023-11-25 ENCOUNTER — Ambulatory Visit: Payer: PPO | Admitting: Urology

## 2023-11-25 DIAGNOSIS — E291 Testicular hypofunction: Secondary | ICD-10-CM | POA: Diagnosis not present

## 2023-11-25 MED ORDER — JATENZO 237 MG PO CAPS: 1.0000 | ORAL_CAPSULE | Freq: Two times a day (BID) | ORAL | 5 refills | Status: DC

## 2023-11-25 NOTE — Progress Notes (Signed)
 11/25/2023 4:14 PM   Christopher [name] 07/02/1956 [id number]  Referring provider: Malen Gauze, MD [address]  [address] [place],  [place] [address]  Followup hypogonadism   HPI: [name] is a 67yo here for followup for hypogonadism. He is currently on jatenzo 237mg  BID. Energy is fair. No recent testosterone labs. No issues with erections currently. No other complaints today   PMH: Past Medical History:  Diagnosis Date   Anxiety    Arthritis    Asthma    BPH (benign prostatic hyperplasia)    Complication of anesthesia    pt had a hard time being able to move after spinal anesthesia , 3-4 hours   COPD (chronic obstructive pulmonary disease) (HCC)    Depression    Diabetes mellitus without complication (HCC)    GERD (gastroesophageal reflux disease)    Gout    no meds   Headache(784.0)    otc meds prn   Heart murmur    dx as a child, no problems as an adult   History of COVID-19 2024   History of hiatal hernia    Hyperlipidemia    IBS (irritable bowel syndrome)    PONV (postoperative nausea and vomiting)    Sleep apnea    uses CIPAP machine at night   Wears partial dentures    bottom partial    Surgical History: Past Surgical History:  Procedure Laterality Date   ANTERIOR CERVICAL DECOMP/DISCECTOMY FUSION N/A 10/13/2022   Procedure: ANTERIOR CERVICAL DISECTOMY FUSION - CERVICAL THREE-CERVICAL FOUR - CERVICAL FOUR-CERVICAL FIVE REMOVAL OF HARDWARE CERVICAL FIVE-CERVICAL SIX;  Surgeon: Christopher Citrin, MD;  Location: [facility];  Service: Neurosurgery;  Laterality: N/A;   APPENDECTOMY     BACK SURGERY  2002   neck and back fusion   BIOPSY  12/27/2015   Procedure: BIOPSY;  Surgeon: Christopher Hippo, MD;  Location: [facility];  Service: Endoscopy;;  Fundus biopsies and duodenal biopsies   BIOPSY  02/10/2020   Procedure: BIOPSY;  Surgeon: Christopher Hippo, MD;  [facility];  Service: Endoscopy;;  antral   CARDIAC CATHETERIZATION     CARDIAC  CATHETERIZATION N/A 09/04/2016   Procedure: Right/Left Heart Cath and Coronary Angiography;  Surgeon: Christopher M [place]az[place], MD;  Location: [facility] INVASIVE CV LAB;  Service: Cardiovascular;  Laterality: N/A;   CHOLECYSTECTOMY     CHONDROPLASTY  08/15/2011   Procedure: CHONDROPLASTY;  Surgeon: Christopher Canada, MD;  Location: [facility];  Service: Orthopedics;  Laterality: Left;   COLONOSCOPY  06/27/2011   Procedure: COLONOSCOPY;  Surgeon: Christopher Hippo, MD;  [facility]: [facility];  Service: Endoscopy;  Laterality: N/A;  9:00 / Pt to be here at 9am for 10:45 procedure, benign polyps removed   COLONOSCOPY N/A 10/05/2014   Procedure: COLONOSCOPY;  Surgeon: Christopher Hippo, MD;  [facility];  Service: Endoscopy;  Laterality: N/A;  930   COLONOSCOPY N/A 02/11/2018   Procedure: COLONOSCOPY;  Surgeon: Christopher Hippo, MD;  [facility];  Service: Endoscopy;  Laterality: N/A;  830   ESOPHAGOGASTRODUODENOSCOPY N/A 12/27/2015   Procedure: ESOPHAGOGASTRODUODENOSCOPY (EGD);  Surgeon: Christopher Hippo, MD;  [facility];  Service: Endoscopy;  Laterality: N/A;  3:00   ESOPHAGOGASTRODUODENOSCOPY (EGD) WITH PROPOFOL N/A 02/10/2020   Procedure: ESOPHAGOGASTRODUODENOSCOPY (EGD) WITH PROPOFOL;  Surgeon: Christopher Hippo, MD;  [facility];  Service: Endoscopy;  Laterality: N/A;  155   HERNIA REPAIR  1998   umbilical hernia  JOINT REPLACEMENT  2023   knee and shoulder   KNEE ARTHROSCOPY     left knee   KNEE ARTHROSCOPY     right knee    LUMBAR LAMINECTOMY/DECOMPRESSION MICRODISCECTOMY  09/14/2012   Procedure: LUMBAR LAMINECTOMY/DECOMPRESSION MICRODISCECTOMY 1 LEVEL;  Surgeon: Christopher Cassis, MD;  Location: [facility];  Service: Neurosurgery;  Laterality: Right;  Right Lumbar three-four Diskectomy   neck fusion  2005   NECK SURGERY     PATELLA-FEMORAL ARTHROPLASTY Right 02/18/2022   Procedure: RIGHT KNEE PATELLA REPLACEMENT, CEMENTED;  Surgeon: Christopher Copa, MD;   Location: [facility];  Service: Orthopedics;  Laterality: Right;   POLYPECTOMY  02/11/2018   Procedure: POLYPECTOMY;  Surgeon: Christopher Hippo, MD;  [facility];  Service: Endoscopy;;  colon   POSTERIOR CERVICAL FUSION/FORAMINOTOMY N/A 09/04/2023   Procedure: Posterior Cervical Fusion, Cervical Five-Cervical Four and Cervical Three-Cervical Three-Cervical Four;  Surgeon: Christopher Citrin, MD;  Location: [facility] OR;  Service: Neurosurgery;  Laterality: N/A;   REVISION TOTAL SHOULDER TO REVERSE TOTAL SHOULDER Left 12/16/2018   Procedure: REVISION TOTAL SHOULDER TO REVERSE TOTAL SHOULDER;  Surgeon: Christopher Copa, MD;  Location: [facility] OR;  Service: Orthopedics;  Laterality: Left;   SHOULDER ARTHROSCOPY WITH BICEPSTENOTOMY Left 09/23/2013   Procedure: SHOULDER ARTHROSCOPY WITH BICEPSTENOTOMY AND EXTENSIVE DEBRIDEMENT;  Surgeon: Christopher Hearing, MD;  Location: [facility];  Service: Orthopedics;  Laterality: Left;   SHOULDER ARTHROSCOPY WITH ROTATOR CUFF REPAIR Left 05/05/2014   Procedure: SHOULDER ARTHROSCOPY LIMITED DEBRIDEMENT;  Surgeon: Christopher Hearing, MD;  Location: [facility];  Service: Orthopedics;  Laterality: Left;   SHOULDER OPEN ROTATOR CUFF REPAIR Left 05/05/2014   Procedure: ROTATOR CUFF REPAIR SHOULDER OPEN;  Surgeon: Christopher Hearing, MD;  Location: [facility];  Service: Orthopedics;  Laterality: Left;   SHOULDER OPEN ROTATOR CUFF REPAIR Left 12/16/2018   Procedure: LEFT SHOULDER POSSIBLE SUBSCAPULARIS REPAIR VS. REVISION TO REVERSE TOTAL SHOULDER REPLACEMENT;  Surgeon: Christopher Copa, MD;  Location: [facility];  Service: Orthopedics;  Laterality: Left;   SHOULDER SURGERY Right    Open [place]ford procedure   SPINAL FUSION     x2, 2003 and 2007   TOTAL KNEE ARTHROPLASTY Left 06/16/2017   Procedure: LEFT TOTAL KNEE ARTHROPLASTY;  Surgeon: Christopher Hearing, MD;  Location: [facility];  Service: Orthopedics;  Laterality: Left;   TOTAL KNEE ARTHROPLASTY Right 05/14/2021   Procedure: TOTAL KNEE  ARTHROPLASTY;  Surgeon: Christopher Hearing, MD;  Location: [facility];  Service: Orthopedics;  Laterality: Right;   TOTAL SHOULDER ARTHROPLASTY Left 08/19/2018   Procedure: left shoulder replacement;  Surgeon: Christopher Copa, MD;  Location: [facility] OR;  Service: Orthopedics;  Laterality: Left;    Home Medications:  Allergies as of 11/25/2023       Reactions   Celebrex [celecoxib] Itching, Swelling   All over   Codeine Nausea And Vomiting, Other (See Comments)   Extreme stomach pain. This includes anything with the derivative of codeine in it. (Does tolerate hydrocodone)   Cortisone Swelling   SWELLING REACTION UNSPECIFIED    Doxycycline Swelling, Other (See Comments)   Made tongue turn black    Medrol [methylprednisolone] Hives, Swelling   Angioedema   Prednisone Swelling   SWELLING REACTION UNSPECIFIED    Relafen [nabumetone] Swelling   SWELLING REACTION UNSPECIFIED    Aspirin Other (See Comments)   Stomach cramps   Oxycodone-acetaminophen Itching, Other (See Comments)   Can tolerate with benadryl    Raloxifene Rash   Rofecoxib Rash  Medication List        Accurate as of November 25, 2023  4:14 PM. If you have any questions, ask your nurse or doctor.          Accu-Chek Guide Test test strip Generic drug: glucose blood Use as instructed   albuterol 108 (90 Base) MCG/ACT inhaler Commonly known as: VENTOLIN HFA inhale 2 puffs into lungs every 6 hours as needed for wheezing or shortness of breath   Blood Glucose Monitoring Suppl Devi 1 each by Does not apply route in the morning, at noon, and at bedtime. May substitute to any manufacturer covered by patient's insurance.   cyclobenzaprine 10 MG tablet Commonly known as: FLEXERIL Take 1 tablet (10 mg total) by mouth 3 (three) times daily as needed for muscle spasms.   EPINEPHrine 0.3 mg/0.3 mL Soaj injection Commonly known as: EPI-PEN Inject 0.3 mg into the muscle as needed for anaphylaxis.   famotidine 40 MG  tablet Commonly known as: Pepcid Take 1 tablet (40 mg total) by mouth 2 (two) times daily as needed (hives).   fexofenadine 180 MG tablet Commonly known as: Allegra Allergy Take 2 tablets (360 mg total) by mouth 2 (two) times daily as needed (hives).   FREEZE IT FAST PAIN RELIEF EX Apply 1 Application topically daily. 10%   gabapentin 300 MG capsule Commonly known as: NEURONTIN Take 300 mg by mouth 3 (three) times daily.   Global Ease Inject Pen Needles 31G X 5 MM Misc Generic drug: Insulin Pen Needle Inject 1 Syringe into the skin daily.   hydrOXYzine 25 MG capsule Commonly known as: VISTARIL Take 25 to 50 mg once at bedtime as needed.   Jatenzo 237 MG Caps Generic drug: Testosterone Undecanoate Take 1 capsule (237 mg total) by mouth in the morning and at bedtime. What changed: how much to take   Lantus SoloStar 100 UNIT/ML Solostar Pen Generic drug: insulin glargine Inject 20 Units into the skin at bedtime.   meloxicam 7.5 MG tablet Commonly known as: MOBIC Take 7.5 mg by mouth 2 (two) times daily.   metFORMIN 500 MG 24 hr tablet Commonly known as: GLUCOPHAGE-XR Take 2 tablets (1,000 mg total) by mouth 2 (two) times daily with a meal.   montelukast 10 MG tablet Commonly known as: Singulair Take 1 tablet (10 mg total) by mouth at bedtime.   nitroGLYCERIN 0.4 MG SL tablet Commonly known as: NITROSTAT PLACE (1) TABLET UNDER TONGUE EVERY 5 MINUTES UP TO (3) DOSES. IF NO RELIEF CALL 911.   ondansetron 4 MG tablet Commonly known as: Zofran Take 1 tablet (4 mg total) by mouth every 8 (eight) hours as needed for nausea.   OneTouch Delica Plus Lancet33G Misc 1 each by Does not apply route in the morning, at noon, and at bedtime.   pantoprazole 40 MG tablet Commonly known as: PROTONIX TAKE (1) TABLET TWICE A DAY BEFORE MEALS. What changed: See the new instructions.   polyethylene glycol-electrolytes 420 g solution Commonly known as: NuLYTELY Take 4,000 mLs by  mouth as directed.   rosuvastatin 20 MG tablet Commonly known as: CRESTOR Take 1 tablet (20 mg total) by mouth daily.   sertraline 50 MG tablet Commonly known as: ZOLOFT Take 1 tablet (50 mg total) by mouth in the morning.   Stiolto Respimat 2.5-2.5 MCG/ACT Aers Generic drug: Tiotropium Bromide-Olodaterol Inhale 2 puffs into the lungs daily.   XOLAIR [place] Inject 2 application  into the skin every 30 (thirty) days. Given at Dr Isidore Moos by  injection        Allergies:  Allergies  Allergen Reactions   Celebrex [Celecoxib] Itching and Swelling    All over   Codeine Nausea And Vomiting and Other (See Comments)    Extreme stomach pain. This includes anything with the derivative of codeine in it. (Does tolerate hydrocodone)   Cortisone Swelling    SWELLING REACTION UNSPECIFIED    Doxycycline Swelling and Other (See Comments)    Made tongue turn black    Medrol [Methylprednisolone] Hives and Swelling    Angioedema    Prednisone Swelling    SWELLING REACTION UNSPECIFIED    Relafen [Nabumetone] Swelling    SWELLING REACTION UNSPECIFIED    Aspirin Other (See Comments)    Stomach cramps   Oxycodone-Acetaminophen Itching and Other (See Comments)    Can tolerate with benadryl    Raloxifene Rash   Rofecoxib Rash    Family History: Family History  Problem Relation Age of Onset   Diabetes Mother    Alzheimer's disease Father    Diabetes Father    Diabetes Other    Lung disease Other    Arthritis Other    Anesthesia problems Neg Hx    Hypotension Neg Hx    Malignant hyperthermia Neg Hx    Pseudochol deficiency Neg Hx    Sleep apnea Neg Hx     Social History:  reports that he quit smoking about 15 months ago. His smoking use included cigarettes. He started smoking about 45 years ago. He has a 44 pack-year smoking history. He has been exposed to tobacco smoke. He has never used smokeless tobacco. He reports that he does not drink alcohol and does not use drugs.  ROS: All  other review of systems were reviewed and are negative except what is noted above in HPI  Physical Exam: BP 127/74   Pulse 69   Constitutional:  Alert and oriented, No acute distress. HEENT: [place] AT, moist mucus membranes.  Trachea midline, no masses. Cardiovascular: No clubbing, cyanosis, or edema. Respiratory: Normal respiratory effort, no increased work of breathing. GI: Abdomen is soft, nontender, nondistended, no abdominal masses GU: No CVA tenderness.  Lymph: No cervical or inguinal lymphadenopathy. Skin: No rashes, bruises or suspicious lesions. Neurologic: Grossly intact, no focal deficits, moving all 4 extremities. Psychiatric: Normal mood and affect.  Laboratory Data: Lab Results  Component Value Date   WBC 7.6 11/23/2023   HGB 14.2 11/23/2023   HCT 41.6 11/23/2023   MCV 82 11/23/2023   PLT 251 11/23/2023    Lab Results  Component Value Date   CREATININE 1.01 11/23/2023    No results found for: "PSA"  Lab Results  Component Value Date   TESTOSTERONE 106 (L) 05/21/2023    Lab Results  Component Value Date   HGBA1C 7.4 (H) 11/23/2023    Urinalysis    Component Value Date/Time   COLORURINE YELLOW 02/11/2022 1008   APPEARANCEUR Clear 07/08/2023 1546   LABSPEC 1.012 02/11/2022 1008   PHURINE 6.0 02/11/2022 1008   GLUCOSEU 3+ (A) 07/08/2023 1546   HGBUR NEGATIVE 02/11/2022 1008   BILIRUBINUR Negative 07/08/2023 1546   KETONESUR negative 10/22/2022 1017   KETONESUR NEGATIVE 02/11/2022 1008   PROTEINUR Negative 07/08/2023 1546   PROTEINUR NEGATIVE 02/11/2022 1008   UROBILINOGEN 0.2 10/22/2022 1017   NITRITE Negative 07/08/2023 1546   NITRITE NEGATIVE 02/11/2022 1008   LEUKOCYTESUR Negative 07/08/2023 1546   LEUKOCYTESUR NEGATIVE 02/11/2022 1008    Lab Results  Component Value Date  LABMICR <3.0 07/08/2023   LABMICR Comment 07/08/2023   WBCUA None seen 07/08/2023   LABEPIT None seen 07/08/2023   BACTERIA None seen 07/08/2023    Pertinent  Imaging:  Results for orders placed during the hospital encounter of 02/19/10  DG Abd 1 View  Narrative Clinical Data: Vomiting and abdominal pain.  Status post lumbar spine surgery 2 days ago.  ABDOMEN - 1 VIEW  Comparison: None.  Findings: Mild gaseous distention of the colon with scattered air fluid levels, including the rectum.  Surgical clips in the right upper and lower abdomen.  Laminectomy defects, interbody spacer and pedicle screw and rod fixation at the L4-5 level.  Minimal dextroconvex lumbar scoliosis.  IMPRESSION: Mild colonic ileus.  Provider: Roselee Culver  No results found for this or any previous visit.  No results found for this or any previous visit.  No results found for this or any previous visit.  No results found for this or any previous visit.  No results found for this or any previous visit.  No results found for this or any previous visit.  No results found for this or any previous visit.   Assessment & Plan:    1. Hypogonadism in male Testosterone labs prior to 10am -continue jatenzo 237mg  BID - Testosterone,Free and Total - Comprehensive metabolic panel   No follow-ups on file.  Wilkie Aye, MD  [facility] [place]

## 2023-11-26 ENCOUNTER — Other Ambulatory Visit

## 2023-11-27 LAB — CBC
Hematocrit: 45 % (ref 37.5–51.0)
Hemoglobin: 14.9 g/dL (ref 13.0–17.7)
MCH: 28.4 pg (ref 26.6–33.0)
MCHC: 33.1 g/dL (ref 31.5–35.7)
MCV: 86 fL (ref 79–97)
Platelets: 233 10*3/uL (ref 150–450)
RBC: 5.25 x10E6/uL (ref 4.14–5.80)
RDW: 15.1 % (ref 11.6–15.4)
WBC: 7.8 10*3/uL (ref 3.4–10.8)

## 2023-11-27 LAB — COMPREHENSIVE METABOLIC PANEL WITH GFR
ALT: 19 IU/L (ref 0–44)
AST: 16 IU/L (ref 0–40)
Albumin: 4.7 g/dL (ref 3.9–4.9)
Alkaline Phosphatase: 92 IU/L (ref 44–121)
BUN/Creatinine Ratio: 15 (ref 10–24)
BUN: 17 mg/dL (ref 8–27)
Bilirubin Total: 0.4 mg/dL (ref 0.0–1.2)
CO2: 22 mmol/L (ref 20–29)
Calcium: 9.7 mg/dL (ref 8.6–10.2)
Chloride: 100 mmol/L (ref 96–106)
Creatinine, Ser: 1.14 mg/dL (ref 0.76–1.27)
Globulin, Total: 2.1 g/dL (ref 1.5–4.5)
Glucose: 124 mg/dL — ABNORMAL HIGH (ref 70–99)
Potassium: 4.5 mmol/L (ref 3.5–5.2)
Sodium: 139 mmol/L (ref 134–144)
Total Protein: 6.8 g/dL (ref 6.0–8.5)
eGFR: 71 mL/min/{1.73_m2} (ref >59)

## 2023-11-27 NOTE — Telephone Encounter (Signed)
 [facility]   [name] Barbette Or left vm wanting to know if pt's insurance required a PA. She says she called the insurance and was told for her to call providers office because they couldn't tell her anything.

## 2023-11-27 NOTE — Telephone Encounter (Signed)
 Spoke with pt spouse and is aware

## 2023-11-29 ENCOUNTER — Encounter: Payer: Self-pay | Admitting: Urology

## 2023-11-29 NOTE — Patient Instructions (Signed)

## 2023-11-30 ENCOUNTER — Encounter (HOSPITAL_COMMUNITY): Payer: Self-pay

## 2023-11-30 ENCOUNTER — Other Ambulatory Visit: Payer: Self-pay

## 2023-11-30 ENCOUNTER — Encounter (HOSPITAL_COMMUNITY)
Admission: RE | Admit: 2023-11-30 | Discharge: 2023-11-30 | Disposition: A | Discharge status: Home / Self Care | Source: Ambulatory Visit | Attending: Internal Medicine | Admitting: Internal Medicine

## 2023-11-30 NOTE — Patient Instructions (Signed)
 Christopher [name]  11/30/2023     @PREFPERIOPPHARMACY @   Your procedure is scheduled on  12/02/2023.   Report to [facility] at  [phone] A.M.   Call this number if you have problems the morning of surgery:  [phone]  If you experience any cold or flu symptoms such as cough, fever, chills, shortness of breath, etc. between now and your scheduled surgery, please notify us at the above number.   Remember:         Take 1/2 of your usual insulin dosage (10 units) the night before your procedure.      DO NOT take any medications for diabetes the morning of your procedure.   Follow the diet and prep instructions given to you by the office.   You may drink clear liquids until  0445 am on 12/02/2023.     Clear liquids allowed are:                    Water, Juice (No red color; non-citric and without pulp; diabetics please choose diet or no sugar options), Carbonated beverages (diabetics please choose diet or no sugar options), Clear Tea (No creamer, milk, or cream, including half & half and powdered creamer), Black Coffee Only (No creamer, milk or cream, including half & half and powdered creamer), and Clear Sports drink (No red color; diabetics please choose diet or no sugar options)    Take these medicines the morning of surgery with A SIP OF WATER              flexeril, pepcid,gabapentin, singulair, protonix, zoloft, zofran (if needed), albuterol.     Do not wear jewelry, make-up or nail polish, including gel polish,  artificial nails, or any other type of covering on natural nails (fingers and  toes).  Do not wear lotions, powders, or perfumes, or deodorant.  Do not shave 48 hours prior to surgery.  Men may shave face and neck.  Do not bring valuables to the hospital.  [facility] is not responsible for any belongings or valuables.  Contacts, dentures or bridgework may not be worn into surgery.  Leave your suitcase in the car.  After surgery it may be brought to your  room.  For patients admitted to the hospital, discharge time will be determined by your treatment team.  Patients discharged the day of surgery will not be allowed to drive home and must have someone with them for 24 hours.    Special instructions:   DO NOT smoke tobacco or vape for 24 hours before your procedure.  Please read over the following fact sheets that you were given. Anesthesia Post-op Instructions and Care and Recovery After Surgery      Colonoscopy, Adult, Care After The following information offers guidance on how to care for yourself after your procedure. Your health care provider may also give you more specific instructions. If you have problems or questions, contact your health care provider. What can I expect after the procedure? After the procedure, it is common to have: A small amount of blood in your stool for 24 hours after the procedure. Some gas. Mild cramping or bloating of your abdomen. Follow these instructions at home: Eating and drinking  Drink enough fluid to keep your urine pale yellow. Follow instructions from your health care provider about eating or drinking restrictions. Resume your normal diet as told by your health care provider. Avoid heavy or fried foods that are  hard to digest. Activity Rest as told by your health care provider. Avoid sitting for a long time without moving. Get up to take short walks every 1-2 hours. This is important to improve blood flow and breathing. Ask for help if you feel weak or unsteady. Return to your normal activities as told by your health care provider. Ask your health care provider what activities are safe for you. Managing cramping and bloating  Try walking around when you have cramps or feel bloated. If directed, apply heat to your abdomen as told by your health care provider. Use the heat source that your health care provider recommends, such as a moist heat pack or a heating pad. Place a towel between your  skin and the heat source. Leave the heat on for 20-30 minutes. Remove the heat if your skin turns bright red. This is especially important if you are unable to feel pain, heat, or cold. You have a greater risk of getting burned. General instructions If you were given a sedative during the procedure, it can affect you for several hours. Do not drive or operate machinery until your health care provider says that it is safe. For the first 24 hours after the procedure: Do not sign important documents. Do not drink alcohol. Do your regular daily activities at a slower pace than normal. Eat soft foods that are easy to digest. Take over-the-counter and prescription medicines only as told by your health care provider. Keep all follow-up visits. This is important. Contact a health care provider if: You have blood in your stool 2-3 days after the procedure. Get help right away if: You have more than a small spotting of blood in your stool. You have large blood clots in your stool. You have swelling of your abdomen. You have nausea or vomiting. You have a fever. You have increasing pain in your abdomen that is not relieved with medicine. These symptoms may be an emergency. Get help right away. Call 911. Do not wait to see if the symptoms will go away. Do not drive yourself to the hospital. Summary After the procedure, it is common to have a small amount of blood in your stool. You may also have mild cramping and bloating of your abdomen. If you were given a sedative during the procedure, it can affect you for several hours. Do not drive or operate machinery until your health care provider says that it is safe. Get help right away if you have a lot of blood in your stool, nausea or vomiting, a fever, or increased pain in your abdomen. This information is not intended to replace advice given to you by your health care provider. Make sure you discuss any questions you have with your health care  provider. Document Revised: 09/30/2022 Document Reviewed: 04/10/2021 Elsevier Patient Education  2024 [employer]General Anesthesia, Adult, Care After The following information offers guidance on how to care for yourself after your procedure. Your health care provider may also give you more specific instructions. If you have problems or questions, contact your health care provider. What can I expect after the procedure? After the procedure, it is common for people to: Have pain or discomfort at the IV site. Have nausea or vomiting. Have a sore throat or hoarseness. Have trouble concentrating. Feel cold or chills. Feel weak, sleepy, or tired (fatigue). Have soreness and body aches. These can affect parts of the body that were not involved in surgery. Follow these instructions at home: For the time period  you were told by your health care provider:  Rest. Do not participate in activities where you could fall or become injured. Do not drive or use machinery. Do not drink alcohol. Do not take sleeping pills or medicines that cause drowsiness. Do not make important decisions or sign legal documents. Do not take care of children on your own. General instructions Drink enough fluid to keep your urine pale yellow. If you have sleep apnea, surgery and certain medicines can increase your risk for breathing problems. Follow instructions from your health care provider about wearing your sleep device: Anytime you are sleeping, including during daytime naps. While taking prescription pain medicines, sleeping medicines, or medicines that make you drowsy. Return to your normal activities as told by your health care provider. Ask your health care provider what activities are safe for you. Take over-the-counter and prescription medicines only as told by your health care provider. Do not use any products that contain nicotine or tobacco. These products include cigarettes, chewing tobacco, and vaping  devices, such as e-cigarettes. These can delay incision healing after surgery. If you need help quitting, ask your health care provider. Contact a health care provider if: You have nausea or vomiting that does not get better with medicine. You vomit every time you eat or drink. You have pain that does not get better with medicine. You cannot urinate or have bloody urine. You develop a skin rash. You have a fever. Get help right away if: You have trouble breathing. You have chest pain. You vomit blood. These symptoms may be an emergency. Get help right away. Call 911. Do not wait to see if the symptoms will go away. Do not drive yourself to the hospital. Summary After the procedure, it is common to have a sore throat, hoarseness, nausea, vomiting, or to feel weak, sleepy, or fatigue. For the time period you were told by your health care provider, do not drive or use machinery. Get help right away if you have difficulty breathing, have chest pain, or vomit blood. These symptoms may be an emergency. This information is not intended to replace advice given to you by your health care provider. Make sure you discuss any questions you have with your health care provider. Document Revised: 11/15/2021 Document Reviewed: 11/15/2021 Elsevier Patient Education  2024 [employer].

## 2023-12-02 ENCOUNTER — Other Ambulatory Visit: Payer: Self-pay

## 2023-12-02 ENCOUNTER — Ambulatory Visit (HOSPITAL_COMMUNITY): Admitting: Anesthesiology

## 2023-12-02 ENCOUNTER — Encounter (HOSPITAL_COMMUNITY): Payer: Self-pay | Admitting: Internal Medicine

## 2023-12-02 ENCOUNTER — Ambulatory Visit (HOSPITAL_COMMUNITY)
Admission: RE | Admit: 2023-12-02 | Discharge: 2023-12-02 | Disposition: A | Discharge status: Home / Self Care | Attending: Internal Medicine | Admitting: Internal Medicine

## 2023-12-02 ENCOUNTER — Encounter (HOSPITAL_COMMUNITY)
Admission: RE | Disposition: A | Discharge status: Home / Self Care | Payer: Self-pay | Source: Home / Self Care | Attending: Internal Medicine

## 2023-12-02 DIAGNOSIS — Z7984 Long term (current) use of oral hypoglycemic drugs: Secondary | ICD-10-CM | POA: Diagnosis not present

## 2023-12-02 DIAGNOSIS — Z1211 Encounter for screening for malignant neoplasm of colon: Secondary | ICD-10-CM | POA: Insufficient documentation

## 2023-12-02 DIAGNOSIS — M1990 Unspecified osteoarthritis, unspecified site: Secondary | ICD-10-CM | POA: Diagnosis not present

## 2023-12-02 DIAGNOSIS — K219 Gastro-esophageal reflux disease without esophagitis: Secondary | ICD-10-CM | POA: Insufficient documentation

## 2023-12-02 DIAGNOSIS — F32A Depression, unspecified: Secondary | ICD-10-CM | POA: Insufficient documentation

## 2023-12-02 DIAGNOSIS — Z6836 Body mass index (BMI) 36.0-36.9, adult: Secondary | ICD-10-CM | POA: Diagnosis not present

## 2023-12-02 DIAGNOSIS — K449 Diaphragmatic hernia without obstruction or gangrene: Secondary | ICD-10-CM | POA: Diagnosis not present

## 2023-12-02 DIAGNOSIS — Z79899 Other long term (current) drug therapy: Secondary | ICD-10-CM | POA: Diagnosis not present

## 2023-12-02 DIAGNOSIS — J449 Chronic obstructive pulmonary disease, unspecified: Secondary | ICD-10-CM

## 2023-12-02 DIAGNOSIS — D120 Benign neoplasm of cecum: Secondary | ICD-10-CM | POA: Diagnosis not present

## 2023-12-02 DIAGNOSIS — E119 Type 2 diabetes mellitus without complications: Secondary | ICD-10-CM | POA: Insufficient documentation

## 2023-12-02 DIAGNOSIS — K648 Other hemorrhoids: Secondary | ICD-10-CM | POA: Diagnosis not present

## 2023-12-02 DIAGNOSIS — Z87891 Personal history of nicotine dependence: Secondary | ICD-10-CM | POA: Insufficient documentation

## 2023-12-02 DIAGNOSIS — E66813 Obesity, class 3: Secondary | ICD-10-CM | POA: Insufficient documentation

## 2023-12-02 DIAGNOSIS — Z794 Long term (current) use of insulin: Secondary | ICD-10-CM | POA: Diagnosis not present

## 2023-12-02 DIAGNOSIS — F419 Anxiety disorder, unspecified: Secondary | ICD-10-CM | POA: Diagnosis not present

## 2023-12-02 DIAGNOSIS — Z791 Long term (current) use of non-steroidal anti-inflammatories (NSAID): Secondary | ICD-10-CM | POA: Insufficient documentation

## 2023-12-02 DIAGNOSIS — R519 Headache, unspecified: Secondary | ICD-10-CM | POA: Diagnosis not present

## 2023-12-02 DIAGNOSIS — E1165 Type 2 diabetes mellitus with hyperglycemia: Secondary | ICD-10-CM

## 2023-12-02 DIAGNOSIS — D123 Benign neoplasm of transverse colon: Secondary | ICD-10-CM

## 2023-12-02 DIAGNOSIS — I2510 Atherosclerotic heart disease of native coronary artery without angina pectoris: Secondary | ICD-10-CM | POA: Insufficient documentation

## 2023-12-02 DIAGNOSIS — Z79890 Hormone replacement therapy (postmenopausal): Secondary | ICD-10-CM | POA: Insufficient documentation

## 2023-12-02 HISTORY — PX: POLYPECTOMY: SHX5525

## 2023-12-02 HISTORY — PX: COLONOSCOPY: SHX5424

## 2023-12-02 LAB — GLUCOSE, CAPILLARY: Glucose-Capillary: 113 mg/dL — ABNORMAL HIGH (ref 70–99)

## 2023-12-02 SURGERY — COLONOSCOPY
Anesthesia: General

## 2023-12-02 MED ORDER — PROPOFOL 10 MG/ML IV BOLUS
INTRAVENOUS | Status: DC | PRN
  Administered 2023-12-02: 80 mg via INTRAVENOUS

## 2023-12-02 MED ORDER — LACTATED RINGERS IV SOLN: INTRAVENOUS | Status: DC | PRN

## 2023-12-02 MED ORDER — EPHEDRINE SULFATE-NACL 50-0.9 MG/10ML-% IV SOSY
PREFILLED_SYRINGE | INTRAVENOUS | Status: DC | PRN
  Administered 2023-12-02: 10 mg via INTRAVENOUS

## 2023-12-02 MED ORDER — PROPOFOL 500 MG/50ML IV EMUL
INTRAVENOUS | Status: DC | PRN
  Administered 2023-12-02: 150 ug/kg/min via INTRAVENOUS

## 2023-12-02 NOTE — Interval H&P Note (Signed)
 History and Physical Interval Note:  12/02/2023 8:31 AM  Christopher [name]  has presented today for surgery, with the diagnosis of HX POLYPS.  The various methods of treatment have been discussed with the patient and family. After consideration of risks, benefits and other options for treatment, the patient has consented to  Procedure(s) with comments: COLONOSCOPY (N/A) - 845am, asa 3 as a surgical intervention.  The patient's history has been reviewed, patient examined, no change in status, stable for surgery.  I have reviewed the patient's chart and labs.  Questions were answered to the patient's satisfaction.     Lanelle Bal

## 2023-12-02 NOTE — Transfer of Care (Signed)
 Immediate Anesthesia Transfer of Care Note  Patient: Christopher [name]  Procedure(s) Performed: COLONOSCOPY POLYPECTOMY  Patient Location: Short Stay  Anesthesia Type:General  Level of Consciousness: awake  Airway & Oxygen Therapy: Patient Spontanous Breathing  Post-op Assessment: Report given to RN  Post vital signs: Reviewed and stable  Last Vitals:  Vitals Value Taken Time  BP    Temp    Pulse    Resp    SpO2      Last Pain:  Vitals:   12/02/23 0843  TempSrc:   PainSc: 0-No pain         Complications: No notable events documented.

## 2023-12-02 NOTE — Anesthesia Postprocedure Evaluation (Signed)
 Anesthesia Post Note  Patient: Christopher [name]  Procedure(s) Performed: COLONOSCOPY POLYPECTOMY  Patient location during evaluation: Short Stay Anesthesia Type: General Level of consciousness: awake and alert Pain management: pain level controlled Vital Signs Assessment: post-procedure vital signs reviewed and stable Respiratory status: spontaneous breathing Cardiovascular status: blood pressure returned to baseline and stable Postop Assessment: no apparent nausea or vomiting Anesthetic complications: no   No notable events documented.   Last Vitals:  Vitals:   12/02/23 0745 12/02/23 0923  BP: 123/67 (!) 112/39  Pulse: 60 (!) 57  Resp: 14 14  Temp: 36.6 C 36.5 C  SpO2: 95% 96%    Last Pain:  Vitals:   12/02/23 0923  TempSrc: Oral  PainSc: 0-No pain                 Michalla Ringer

## 2023-12-02 NOTE — Anesthesia Preprocedure Evaluation (Signed)
 Anesthesia Evaluation  Patient identified by MRN, date of birth, ID band Patient awake    Reviewed: Allergy & Precautions, NPO status , Patient's Chart, lab work & pertinent test results, reviewed documented beta blocker date and time   History of Anesthesia Complications (+) PONV and history of anesthetic complications  Airway Mallampati: III  TM Distance: >3 FB Neck ROM: Limited    Dental  (+) Edentulous Upper, Edentulous Lower   Pulmonary asthma , sleep apnea , COPD, Current Smoker, former smoker   breath sounds clear to auscultation       Cardiovascular + angina with exertion + DOE  (-) CAD, (-) Past MI and (-) Cardiac Stents Normal cardiovascular exam(-) pacemaker(-) Cardiac Defibrillator + Valvular Problems/Murmurs  Rhythm:Regular Rate:Normal  Nonobstructive CAD, normal LV/RV function   Neuro/Psych  Headaches, neg Seizures PSYCHIATRIC DISORDERS Anxiety Depression       GI/Hepatic hiatal hernia,GERD  ,,(+) neg Cirrhosis        Endo/Other  diabetes, Well Controlled, Type 2  Class 3 obesity  Renal/GU Renal disease     Musculoskeletal  (+) Arthritis ,    Abdominal  (+) + obese  Peds  Hematology   Anesthesia Other Findings   Reproductive/Obstetrics                             Anesthesia Physical Anesthesia Plan  ASA: 3  Anesthesia Plan: General   Post-op Pain Management: Minimal or no pain anticipated   Induction: Intravenous  PONV Risk Score and Plan: Propofol infusion  Airway Management Planned: Nasal Cannula and Natural Airway  Additional Equipment: None  Intra-op Plan:   Post-operative Plan:   Informed Consent: I have reviewed the patients History and Physical, chart, labs and discussed the procedure including the risks, benefits and alternatives for the proposed anesthesia with the patient or authorized representative who has indicated his/her understanding and  acceptance.     Dental advisory given  Plan Discussed with: CRNA  Anesthesia Plan Comments: (PAT note by Antionette Poles, PA-C: Pt seen by cardiology 2018 for eval of intermittent CP and coronary calcifications seen on CT. Had cath 09/04/2016 showing diffuse mild nonobstructive disease, normal LV function EF 55-65%, normal right heart and LV filling pressures, normal cardiac output. Medical therapy recommended.  He was last seen by Dr. Jenene Slicker 09/26/2022 for preop evaluation.  Per note, "Patient has chronic stable DOE since 2018 and has not improved or worsened. He reported having 1 episode of chest heaviness with SOB and denied any bilateral lower extremity swelling. EKG showed sinus bradycardia and an incomplete right bundle branch block.LHC and RHC from 2018 showed diffuse mild nonobstructive CAD, normal LV function, normal right heart and LV filling pressures and normal cardiac output. I do not think this chronic DOE is ischemic in etiology given the fact that he underwent LHC and RHC in 2018 when he had similar symptoms of DOE and found to have mild, nonobstructive CAD with normal filling pressures in the heart. He will need to follow-up with his PCP for COPD management. Oneepisode of chest heaviness might be secondary to SOB but I do not think this is ischemic in etiology. Will also obtain 2D echocardiogram to rule out diastolic dysfunction but patient does not have to wait until echocardiogram is reported. For time being, he will take Lasix 20 mg daily as needed and assess for any improvement in symptoms. Otherwise, he is compensated on examination and has no loud murmur.He  is at a moderate risk for any perioperative cardiac complications. No further cardiac testing like stress test is indicated prior to proceeding with the planned procedure." He did subsequently undergo C3-5 ACDF  10/13/22 without complication.   Follows with pulmonologist Dr. Craige Cotta for history of OSA on CPAP and DOE likely  secondary to emphysema with possible COPD, obesity, and deconditioning.  He is a former smoker with 44-pack-year history.  PFT 01/13/23:  FEV1 2.00 (67%), FEV1% 65, TLC 5.94 (95%), RV 2.93 (139%), DLCO 72%. Maintained on Stiolto and PRN albuterol. Last seen 06/01/23, stable at that time, no changes to management.   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 Maintained on Allegra, Pepcid, hydroxyzine, Singulair, Claritin, Xolair.  IDDM2, uncontrolled. Previously known to be prediabetic with recent progression to frank diabetes. Seen by PCP 07/08/23. A1c at that time 14.3 with BG 601. His metformin was increased and he was stared on lantus and ozempic. He was seen in followup on 12/5. Per note, blood sugar trending down, but still readings over 200. He was continued on metformin and Ozempic. Lantus was decreased to 20 units nightly due to symptoms of clamminess at night.  On preop phone call pt reported recent fasting BG 160-170.  LD Ozempic 08/25/23.   History of difficult intubation secondary to reduced neck mobility.   Will need DOS labs and eval.    EKG 09/26/2022: Sinus bradycardia.  Rate 57.  Incomplete right bundle branch block.   TTE 03/18/23: 1. Left ventricular ejection fraction, by estimation, is 60 to 65%. The  left ventricle has normal function. The left ventricle has no regional  wall motion abnormalities. Left ventricular diastolic parameters are  consistent with Grade I diastolic  dysfunction (impaired relaxation).  2. Right ventricular systolic function is normal. The right ventricular  size is normal.  3. The mitral valve is normal in structure. No evidence of mitral valve  regurgitation. No evidence of mitral stenosis.  4. The aortic valve was not well visualized. Aortic valve regurgitation  is not visualized. No aortic stenosis is present.  5. Aortic dilatation noted. There is borderline dilatation of the aortic  root, measuring 38 mm.  6.  The inferior vena cava is normal in size with greater than 50%  respiratory variability, suggesting right atrial pressure of 3 mmHg.   Cath 09/04/16:            The left ventricular systolic function is normal.            LV end diastolic pressure is normal.            The left ventricular ejection fraction is 55-65% by visual estimate.            LV end diastolic pressure is normal.   1. Diffuse mild nonobstructive disease 2. Normal LV function 3. Normal right heart and LV filling pressures 4. Normal cardiac output.    Plan: Medical therapy.  )        Anesthesia Quick Evaluation

## 2023-12-02 NOTE — Discharge Instructions (Signed)
  Colonoscopy Discharge Instructions  Read the instructions outlined below and refer to this sheet in the next few weeks. These discharge instructions provide you with general information on caring for yourself after you leave the hospital. Your doctor may also give you specific instructions. While your treatment has been planned according to the most current medical practices available, unavoidable complications occasionally occur.   ACTIVITY You may resume your regular activity, but move at a slower pace for the next 24 hours.  Take frequent rest periods for the next 24 hours.  Walking will help get rid of the air and reduce the bloated feeling in your belly (abdomen).  No driving for 24 hours (because of the medicine (anesthesia) used during the test).   Do not sign any important legal documents or operate any machinery for 24 hours (because of the anesthesia used during the test).  NUTRITION Drink plenty of fluids.  You may resume your normal diet as instructed by your doctor.  Begin with a light meal and progress to your normal diet. Heavy or fried foods are harder to digest and may make you feel sick to your stomach (nauseated).  Avoid alcoholic beverages for 24 hours or as instructed.  MEDICATIONS You may resume your normal medications unless your doctor tells you otherwise.  WHAT YOU CAN EXPECT TODAY Some feelings of bloating in the abdomen.  Passage of more gas than usual.  Spotting of blood in your stool or on the toilet paper.  IF YOU HAD POLYPS REMOVED DURING THE COLONOSCOPY: No aspirin products for 7 days or as instructed.  No alcohol for 7 days or as instructed.  Eat a soft diet for the next 24 hours.  FINDING OUT THE RESULTS OF YOUR TEST Not all test results are available during your visit. If your test results are not back during the visit, make an appointment with your caregiver to find out the results. Do not assume everything is normal if you have not heard from your  caregiver or the medical facility. It is important for you to follow up on all of your test results.  SEEK IMMEDIATE MEDICAL ATTENTION IF: You have more than a spotting of blood in your stool.  Your belly is swollen (abdominal distention).  You are nauseated or vomiting.  You have a temperature over 101.  You have abdominal pain or discomfort that is severe or gets worse throughout the day.   Your colonoscopy revealed 14 polyp(s) which I removed successfully. Await pathology results, my office will contact you. I recommend repeating colonoscopy in 1 year for surveillance purposes.    I hope you have a great rest of your week!  Hennie Duos. Marletta Lor, D.O. Gastroenterology and Hepatology [facility]

## 2023-12-02 NOTE — Op Note (Signed)
 [facility] Patient Name: Christopher [name] Procedure Date: 12/02/2023 8:25 AM MRN: [id number] Date of Birth: [birth year] Attending MD: Hennie Duos. Marletta Lor , [place], [id number] CSN: [id number] Age: 68 Admit Type: Outpatient Procedure:                Colonoscopy Indications:              Surveillance: Personal history of colonic polyps                            (unknown histology) on last colonoscopy more than 5                            years ago Providers:                Hennie Duos. Marletta Lor, DO, Edrick Kins, RN, Lennice Sites Technician, Technician Referring MD:              Medicines:                See the Anesthesia note for documentation of the                            administered medications Complications:            No immediate complications. Estimated Blood Loss:     Estimated blood loss was minimal. Procedure:                Pre-Anesthesia Assessment:                           - The anesthesia plan was to use monitored                            anesthesia care (MAC).                           After obtaining informed consent, the colonoscope                            was passed under direct vision. Throughout the                            procedure, the patient's blood pressure, pulse, and                            oxygen saturations were monitored continuously. The                            [id number]-HQ190L ([id number]) scope was introduced through                            the anus and advanced to the the cecum, identified                            by appendiceal orifice and ileocecal valve.  The                            colonoscopy was performed without difficulty. The                            patient tolerated the procedure well. The quality                            of the bowel preparation was evaluated using the                            BBPS [facility] Bowel Preparation Scale) with scores                            of: Right Colon = 3,  Transverse Colon = 3 and Left                            Colon = 3 (entire mucosa seen well with no residual                            staining, small fragments of stool or opaque                            liquid). The total BBPS score equals 9. Scope In: 8:50:32 AM Scope Out: 9:18:21 AM Scope Withdrawal Time: 0 hours 25 minutes 37 seconds  Total Procedure Duration: 0 hours 27 minutes 49 seconds  Findings:      Non-bleeding internal hemorrhoids were found during endoscopy.      Two sessile polyps were found in the cecum. The polyps were 7 to 8 mm in       size. These polyps were removed with a cold snare. Resection and       retrieval were complete.      12 sessile polyps were found in the transverse colon. The polyps were 4       to 8 mm in size. These polyps were removed with a cold snare. Resection       and retrieval were complete.      The exam was otherwise without abnormality. Impression:               - Non-bleeding internal hemorrhoids.                           - Two 7 to 8 mm polyps in the cecum, removed with a                            cold snare. Resected and retrieved.                           - 12 4 to 8 mm polyps in the transverse colon,                            removed with a cold snare. Resected and retrieved.                           -  The examination was otherwise normal. Moderate Sedation:      Per Anesthesia Care Recommendation:           - Patient has a contact number available for                            emergencies. The signs and symptoms of potential                            delayed complications were discussed with the                            patient. Return to normal activities tomorrow.                            Written discharge instructions were provided to the                            patient.                           - Resume previous diet.                           - Continue present medications.                           - Await  pathology results.                           - Repeat colonoscopy in 1 year for surveillance.                           - Return to GI clinic PRN. Procedure Code(s):        --- Professional ---                           [phone], Colonoscopy, flexible; with removal of                            tumor(s), polyp(s), or other lesion(s) by snare                            technique Diagnosis Code(s):        --- Professional ---                           Z86.010, Personal history of colonic polyps                           D12.0, Benign neoplasm of cecum                           D12.3, Benign neoplasm of transverse colon (hepatic                            flexure or splenic flexure)  K64.8, Other hemorrhoids CPT copyright 2022 American Medical Association. All rights reserved. The codes documented in this report are preliminary and upon coder review may  be revised to meet current compliance requirements. Hennie Duos. Marletta Lor, DO Hennie Duos. Marletta Lor, DO 12/02/2023 9:23:15 AM This report has been signed electronically. Number of Addenda: 0

## 2023-12-03 ENCOUNTER — Encounter (HOSPITAL_COMMUNITY): Payer: Self-pay | Admitting: Internal Medicine

## 2023-12-03 LAB — TESTOSTERONE,FREE AND TOTAL
Testosterone, Free: 46.5 pg/mL — ABNORMAL HIGH (ref 6.6–18.1)
Testosterone: 698 ng/dL (ref 264–916)

## 2023-12-03 LAB — SURGICAL PATHOLOGY

## 2023-12-07 ENCOUNTER — Other Ambulatory Visit: Payer: Self-pay | Admitting: *Deleted

## 2023-12-07 MED ORDER — XOLAIR 300 MG/2ML ~~LOC~~ SOSY: 300.0000 mg | PREFILLED_SYRINGE | SUBCUTANEOUS | 11 refills | Status: AC

## 2023-12-09 ENCOUNTER — Other Ambulatory Visit: Payer: Self-pay

## 2023-12-09 DIAGNOSIS — F17210 Nicotine dependence, cigarettes, uncomplicated: Secondary | ICD-10-CM

## 2023-12-09 DIAGNOSIS — Z87891 Personal history of nicotine dependence: Secondary | ICD-10-CM

## 2023-12-09 DIAGNOSIS — Z122 Encounter for screening for malignant neoplasm of respiratory organs: Secondary | ICD-10-CM

## 2023-12-10 ENCOUNTER — Telehealth: Payer: Self-pay | Admitting: *Deleted

## 2023-12-10 NOTE — Telephone Encounter (Signed)
 Wife called and is on DPR --results given No cancer in specimens however Dr. Marletta Lor will review but will probably need repeat surveillance in 5 years-sooner but he will determine.

## 2023-12-21 ENCOUNTER — Ambulatory Visit

## 2023-12-21 DIAGNOSIS — L501 Idiopathic urticaria: Secondary | ICD-10-CM | POA: Diagnosis not present

## 2023-12-23 ENCOUNTER — Other Ambulatory Visit: Payer: Self-pay | Admitting: Internal Medicine

## 2023-12-23 ENCOUNTER — Ambulatory Visit: Payer: Self-pay

## 2023-12-23 ENCOUNTER — Other Ambulatory Visit: Payer: Self-pay

## 2023-12-23 DIAGNOSIS — E782 Mixed hyperlipidemia: Secondary | ICD-10-CM

## 2023-12-23 NOTE — Telephone Encounter (Signed)
 Tried to call and speak to patient to see if they will call their insurance to see what is covered, constant ring, unable to speak to patient or leave voice mail

## 2023-12-23 NOTE — Telephone Encounter (Signed)
 Chief Complaint: DME request Symptoms: NA Frequency: NA Pertinent Negatives: Patient denies NA Disposition: [] ED /[] Urgent Care (no appt availability in office) / [] Appointment(In office/virtual)/ []  [place] [facility]/ [] Home Care/ [] Refused Recommended Disposition /[] [place] [facility]/ [x]  Follow-up with PCP Additional Notes:  Please advise on request for CGM.  Copied from CRM [id number]. Topic: Clinical - Medication Question >> Dec 23, 2023  9:29 AM Alpha Arts wrote: Reason for CRM: Patient's wife, Donella Fulling, is wondering if the patient can have a blood glucose monitor where he doesn't have to stick his finger.  Preferred Pharmacy: [facility] [place], [place] - [id number] [place] [address] Phone: [phone] Fax: [phone] Hours: Not open 24 hours  Callback #: [phone] Reason for Disposition . [1] Caller requests to speak ONLY to PCP AND [2] NON-URGENT question  Protocols used: PCP Call - No Triage-A-AH

## 2023-12-24 ENCOUNTER — Ambulatory Visit: Admitting: Internal Medicine

## 2023-12-25 ENCOUNTER — Telehealth: Payer: Self-pay

## 2023-12-25 ENCOUNTER — Other Ambulatory Visit: Payer: Self-pay

## 2023-12-25 ENCOUNTER — Telehealth: Payer: Self-pay | Admitting: Internal Medicine

## 2023-12-25 DIAGNOSIS — E1165 Type 2 diabetes mellitus with hyperglycemia: Secondary | ICD-10-CM

## 2023-12-25 MED ORDER — ACCU-CHEK GUIDE TEST VI STRP: ORAL_STRIP | 0 refills | Status: DC

## 2023-12-25 MED ORDER — GLOBAL EASE INJECT PEN NEEDLES 31G X 5 MM MISC: 1.0000 | Freq: Every day | 0 refills | Status: AC

## 2023-12-25 NOTE — Telephone Encounter (Signed)
 Copied from CRM [id number]. Topic: Clinical - Medication Refill >> Dec 25, 2023  9:20 AM Annice Kim wrote: Most Recent Primary Care Visit:  Provider: Abigail Hoff A  Department: [facility] CARE  Visit Type: ANNUAL WELL VISIT, SEQUENTIAL  Date: 10/20/2023  Medication: glucose blood (ACCU-CHEK GUIDE TEST) test strip [[id number]] GLOBAL EASE INJECT PEN NEEDLES 31G X 5 MM MISC [[id number]] Has the patient contacted their pharmacy? Yes (Agent: If no, request that the patient contact the pharmacy for the refill. If patient does not wish to contact the pharmacy document the reason why and proceed with request.) (Agent: If yes, when and what did the pharmacy advise?)  Is this the correct pharmacy for this prescription? Yes If no, delete pharmacy and type the correct one.  This is the patient's preferred pharmacy:  [facility] [place], [place] - [id number] [place] [address] Phone: [phone] Fax: [phone]   Has the prescription been filled recently? Yes  Is the patient out of the medication? Yes  Has the patient been seen for an appointment in the last year OR does the patient have an upcoming appointment? Yes  Can we respond through MyChart? Yes  Agent: Please be advised that Rx refills may take up to 3 business days. We ask that you follow-up with your pharmacy.

## 2023-12-25 NOTE — Telephone Encounter (Signed)
 Copied from CRM [id number]. Topic: Clinical - Medication Question >> Dec 25, 2023  9:31 AM Jyl Or S wrote: Reason for CRM: Patient wife is requesting call back regarding ozempic  she stated Dr Dixion Prescribed it but I don't see it in his med list please follow up

## 2023-12-25 NOTE — Telephone Encounter (Signed)
 Refill sent to pharmacy.

## 2023-12-28 ENCOUNTER — Other Ambulatory Visit: Payer: Self-pay | Admitting: Internal Medicine

## 2023-12-29 ENCOUNTER — Other Ambulatory Visit: Payer: Self-pay

## 2023-12-29 ENCOUNTER — Telehealth: Payer: Self-pay | Admitting: Internal Medicine

## 2023-12-29 DIAGNOSIS — E1165 Type 2 diabetes mellitus with hyperglycemia: Secondary | ICD-10-CM

## 2023-12-29 MED ORDER — ACCU-CHEK GUIDE W/DEVICE KIT
1.0000 | PACK | Freq: Three times a day (TID) | 0 refills | Status: DC
Start: 2023-12-29 — End: 2024-02-05

## 2023-12-29 NOTE — Telephone Encounter (Signed)
 Sent to pharmacy

## 2023-12-29 NOTE — Telephone Encounter (Signed)
 Patient needs test strips, lancets and the machine device sent to pharmacy.  Spouse says pharmacy has all except for the machine.   Pharmacy: [facility] pharmacy

## 2023-12-29 NOTE — Telephone Encounter (Signed)
 Unable to reach patient, rings and rings, no answer

## 2023-12-30 ENCOUNTER — Other Ambulatory Visit: Payer: Self-pay | Admitting: Student

## 2023-12-30 DIAGNOSIS — M5416 Radiculopathy, lumbar region: Secondary | ICD-10-CM

## 2023-12-31 ENCOUNTER — Encounter: Payer: Self-pay | Admitting: Internal Medicine

## 2023-12-31 ENCOUNTER — Ambulatory Visit (INDEPENDENT_AMBULATORY_CARE_PROVIDER_SITE_OTHER): Admitting: Internal Medicine

## 2023-12-31 VITALS — BP 146/78 | HR 68 | Ht 66.0 in | Wt 228.6 lb

## 2023-12-31 DIAGNOSIS — G4733 Obstructive sleep apnea (adult) (pediatric): Secondary | ICD-10-CM | POA: Diagnosis not present

## 2023-12-31 DIAGNOSIS — E1165 Type 2 diabetes mellitus with hyperglycemia: Secondary | ICD-10-CM | POA: Diagnosis not present

## 2023-12-31 DIAGNOSIS — E782 Mixed hyperlipidemia: Secondary | ICD-10-CM | POA: Diagnosis not present

## 2023-12-31 DIAGNOSIS — J449 Chronic obstructive pulmonary disease, unspecified: Secondary | ICD-10-CM

## 2023-12-31 DIAGNOSIS — Z794 Long term (current) use of insulin: Secondary | ICD-10-CM

## 2023-12-31 NOTE — Patient Instructions (Signed)
 It was a pleasure to see you today.  Thank you for giving us  the opportunity to be involved in your care.  Below is a brief recap of your visit and next steps.  We will plan to see you again in 3 months.  Summary Increase Ozempic  to 1 mg weekly Discontinue lantus   Repeat cholesterol panel Follow up in 3 months

## 2023-12-31 NOTE — Progress Notes (Signed)
 Established Patient Office Visit  Subjective   Patient ID: Christopher [name], male    DOB: 27-Feb-1956  Age: 68 y.o. MRN: [id number]  Chief Complaint  Patient presents with   Care Management    Three month follow up    Mr. Cirrito returns.  For follow-up.  He was last evaluated by me in December 2024 for follow-up of diabetes mellitus.  In the interim he underwent surgery on his cervical spine on 1/3, discharged 1/4.  He has been seen by gastroenterology, urology, and allergy /immunology for follow-up in the interim.  He underwent colonoscopy on 4/2, which revealed 2 cecal polyps and 12 polyps in the transverse colon.  Repeat colonoscopy recommended for 1 year.  Today he reports feeling well and has no acute concerns to discuss.  Past Medical History:  Diagnosis Date   Anxiety    Arthritis    Asthma    BPH (benign prostatic hyperplasia)    Complication of anesthesia    pt had a hard time being able to move after spinal anesthesia , 3-4 hours   COPD (chronic obstructive pulmonary disease) (HCC)    Depression    Diabetes mellitus without complication (HCC)    GERD (gastroesophageal reflux disease)    Gout    no meds   Headache(784.0)    otc meds prn   Heart murmur    dx as a child, no problems as an adult   History of COVID-19 2024   History of hiatal hernia    Hyperlipidemia    IBS (irritable bowel syndrome)    PONV (postoperative nausea and vomiting)    Sleep apnea    uses CIPAP machine at night   Wears partial dentures    bottom partial   Past Surgical History:  Procedure Laterality Date   ANTERIOR CERVICAL DECOMP/DISCECTOMY FUSION N/A 10/13/2022   Procedure: ANTERIOR CERVICAL DISECTOMY FUSION - CERVICAL THREE-CERVICAL FOUR - CERVICAL FOUR-CERVICAL FIVE REMOVAL OF HARDWARE CERVICAL FIVE-CERVICAL SIX;  Surgeon: Gearl Keens, MD;  Location: [facility] OR;  Service: Neurosurgery;  Laterality: N/A;   APPENDECTOMY     BACK SURGERY  2002   neck and back fusion   BIOPSY  12/27/2015    Procedure: BIOPSY;  Surgeon: Ruby Corporal, MD;  Location: [facility];  Service: Endoscopy;;  Fundus biopsies and duodenal biopsies   BIOPSY  02/10/2020   Procedure: BIOPSY;  Surgeon: Ruby Corporal, MD;  [facility];  Service: Endoscopy;;  antral   CARDIAC CATHETERIZATION     CARDIAC CATHETERIZATION N/A 09/04/2016   Procedure: Right/Left Heart Cath and Coronary Angiography;  Surgeon: Peter M Swaziland, MD;  Location: [facility] INVASIVE CV LAB;  Service: Cardiovascular;  Laterality: N/A;   CHOLECYSTECTOMY     CHONDROPLASTY  08/15/2011   Procedure: CHONDROPLASTY;  Surgeon: Elsa Halls, MD;  Location: [facility] ORS;  Service: Orthopedics;  Laterality: Left;   COLONOSCOPY  06/27/2011   Procedure: COLONOSCOPY;  Surgeon: Ruby Corporal, MD;  [facility];  Service: Endoscopy;  Laterality: N/A;  9:00 / Pt to be here at 9am for 10:45 procedure, benign polyps removed   COLONOSCOPY N/A 10/05/2014   Procedure: COLONOSCOPY;  Surgeon: Ruby Corporal, MD;  [facility];  Service: Endoscopy;  Laterality: N/A;  930   COLONOSCOPY N/A 02/11/2018   Procedure: COLONOSCOPY;  Surgeon: Ruby Corporal, MD;  [facility];  Service: Endoscopy;  Laterality: N/A;  830   COLONOSCOPY N/A 12/02/2023   Procedure: COLONOSCOPY;  Surgeon: Vinetta Greening, DO;  [facility];  Service: Endoscopy;  Laterality: N/A;  845am, asa 3   ESOPHAGOGASTRODUODENOSCOPY N/A 12/27/2015   Procedure: ESOPHAGOGASTRODUODENOSCOPY (EGD);  Surgeon: Ruby Corporal, MD;  [facility];  Service: Endoscopy;  Laterality: N/A;  3:00   ESOPHAGOGASTRODUODENOSCOPY (EGD) WITH PROPOFOL  N/A 02/10/2020   Procedure: ESOPHAGOGASTRODUODENOSCOPY (EGD) WITH PROPOFOL ;  Surgeon: Ruby Corporal, MD;  [facility];  Service: Endoscopy;  Laterality: N/A;  155   HERNIA REPAIR  1998   umbilical hernia   JOINT REPLACEMENT  2023   knee and shoulder-bilateral knees and left shoulder   KNEE  ARTHROSCOPY     left knee   KNEE ARTHROSCOPY     right knee    LUMBAR LAMINECTOMY/DECOMPRESSION MICRODISCECTOMY  09/14/2012   Procedure: LUMBAR LAMINECTOMY/DECOMPRESSION MICRODISCECTOMY 1 LEVEL;  Surgeon: Adelbert Adler, MD;  Location: [facility] NEURO ORS;  Service: Neurosurgery;  Laterality: Right;  Right Lumbar three-four Diskectomy   neck fusion  2005   NECK SURGERY     PATELLA-FEMORAL ARTHROPLASTY Right 02/18/2022   Procedure: RIGHT KNEE PATELLA REPLACEMENT, CEMENTED;  Surgeon: Jasmine Mesi, MD;  Location: [facility] OR;  Service: Orthopedics;  Laterality: Right;   POLYPECTOMY  02/11/2018   Procedure: POLYPECTOMY;  Surgeon: Ruby Corporal, MD;  [facility];  Service: Endoscopy;;  colon   POLYPECTOMY  12/02/2023   Procedure: POLYPECTOMY;  Surgeon: Vinetta Greening, DO;  [facility];  Service: Endoscopy;;   POSTERIOR CERVICAL FUSION/FORAMINOTOMY N/A 09/04/2023   Procedure: Posterior Cervical Fusion, Cervical Five-Cervical Four and Cervical Three-Cervical Three-Cervical Four;  Surgeon: Gearl Keens, MD;  Location: [facility] OR;  Service: Neurosurgery;  Laterality: N/A;   REVISION TOTAL SHOULDER TO REVERSE TOTAL SHOULDER Left 12/16/2018   Procedure: REVISION TOTAL SHOULDER TO REVERSE TOTAL SHOULDER;  Surgeon: Jasmine Mesi, MD;  Location: [facility] OR;  Service: Orthopedics;  Laterality: Left;   SHOULDER ARTHROSCOPY WITH BICEPSTENOTOMY Left 09/23/2013   Procedure: SHOULDER ARTHROSCOPY WITH BICEPSTENOTOMY AND EXTENSIVE DEBRIDEMENT;  Surgeon: Darrin Emerald, MD;  Location: [facility];  Service: Orthopedics;  Laterality: Left;   SHOULDER ARTHROSCOPY WITH ROTATOR CUFF REPAIR Left 05/05/2014   Procedure: SHOULDER ARTHROSCOPY LIMITED DEBRIDEMENT;  Surgeon: Darrin Emerald, MD;  Location: [facility];  Service: Orthopedics;  Laterality: Left;   SHOULDER OPEN ROTATOR CUFF REPAIR Left 05/05/2014   Procedure: ROTATOR CUFF REPAIR SHOULDER OPEN;  Surgeon: Darrin Emerald, MD;  Location: [facility];   Service: Orthopedics;  Laterality: Left;   SHOULDER OPEN ROTATOR CUFF REPAIR Left 12/16/2018   Procedure: LEFT SHOULDER POSSIBLE SUBSCAPULARIS REPAIR VS. REVISION TO REVERSE TOTAL SHOULDER REPLACEMENT;  Surgeon: Jasmine Mesi, MD;  Location: [facility] OR;  Service: Orthopedics;  Laterality: Left;   SHOULDER SURGERY Right    Open Mumford procedure   SPINAL FUSION     x2, 2003 and 2007   TOTAL KNEE ARTHROPLASTY Left 06/16/2017   Procedure: LEFT TOTAL KNEE ARTHROPLASTY;  Surgeon: Darrin Emerald, MD;  Location: [facility];  Service: Orthopedics;  Laterality: Left;   TOTAL KNEE ARTHROPLASTY Right 05/14/2021   Procedure: TOTAL KNEE ARTHROPLASTY;  Surgeon: Darrin Emerald, MD;  Location: [facility];  Service: Orthopedics;  Laterality: Right;   TOTAL SHOULDER ARTHROPLASTY Left 08/19/2018   Procedure: left shoulder replacement;  Surgeon: Jasmine Mesi, MD;  Location: [facility] OR;  Service: Orthopedics;  Laterality: Left;   Social History   Tobacco Use   Smoking status: Every Day    Current  packs/day: 1.00    Average packs/day: 1 pack/day for 45.4 years (45.4 ttl pk-yrs)    Types: Cigarettes    Start date: 08/25/1978    Passive exposure: Current   Smokeless tobacco: Never   Tobacco comments:    smokes a pack a day. since age 82  Vaping Use   Vaping status: Never Used  Substance Use Topics   Alcohol use: Never   Drug use: Never   Family History  Problem Relation Age of Onset   Diabetes Mother    Alzheimer's disease Father    Diabetes Father    Diabetes Other    Lung disease Other    Arthritis Other    Anesthesia problems Neg Hx    Hypotension Neg Hx    Malignant hyperthermia Neg Hx    Pseudochol deficiency Neg Hx    Sleep apnea Neg Hx    Allergies  Allergen Reactions   Celebrex [Celecoxib] Itching and Swelling    All over   Codeine Nausea And Vomiting and Other (See Comments)    Extreme stomach pain. This includes anything with the derivative of codeine in it. (Does tolerate  hydrocodone )   Cortisone Swelling    SWELLING REACTION UNSPECIFIED    Doxycycline Swelling and Other (See Comments)    Made tongue turn black    Medrol  [Methylprednisolone ] Hives and Swelling    Angioedema    Prednisone  Swelling    SWELLING REACTION UNSPECIFIED    Relafen [Nabumetone] Swelling    SWELLING REACTION UNSPECIFIED    Aspirin  Other (See Comments)    Stomach cramps   Oxycodone -Acetaminophen  Itching and Other (See Comments)    Can tolerate with benadryl     Raloxifene Rash   Rofecoxib Rash   Review of Systems  Constitutional:  Negative for chills and fever.  HENT:  Negative for sore throat.   Respiratory:  Negative for cough and shortness of breath.   Cardiovascular:  Negative for chest pain, palpitations and leg swelling.  Gastrointestinal:  Negative for abdominal pain, blood in stool, constipation, diarrhea, nausea and vomiting.  Genitourinary:  Negative for dysuria and hematuria.  Musculoskeletal:  Negative for myalgias.  Skin:  Negative for itching and rash.  Neurological:  Negative for dizziness and headaches.  Psychiatric/Behavioral:  Negative for depression and suicidal ideas.      Objective:     BP (!) 146/78   Pulse 68   Ht 5\' 6"  (1.676 m)   Wt 228 lb 9.6 oz (103.7 kg)   SpO2 95%   BMI 36.90 kg/m  BP Readings from Last 3 Encounters:  12/31/23 (!) 146/78  12/02/23 (!) 112/39  11/30/23 (!) 146/68   Physical Exam Vitals reviewed.  Constitutional:      General: He is not in acute distress.    Appearance: Normal appearance. He is obese. He is not ill-appearing.  HENT:     Head: Normocephalic and atraumatic.     Right Ear: External ear normal.     Left Ear: External ear normal.     Nose: Nose normal. No congestion or rhinorrhea.     Mouth/Throat:     Mouth: Mucous membranes are moist.     Pharynx: Oropharynx is clear.  Eyes:     General: No scleral icterus.    Extraocular Movements: Extraocular movements intact.     Conjunctiva/sclera:  Conjunctivae normal.     Pupils: Pupils are equal, round, and reactive to light.  Cardiovascular:     Rate and Rhythm: Normal rate and regular  rhythm.     Pulses: Normal pulses.     Heart sounds: Normal heart sounds. No murmur heard. Pulmonary:     Effort: Pulmonary effort is normal.     Breath sounds: Normal breath sounds. No wheezing, rhonchi or rales.  Abdominal:     General: Abdomen is flat. Bowel sounds are normal. There is no distension.     Palpations: Abdomen is soft.     Tenderness: There is no abdominal tenderness.  Musculoskeletal:        General: No swelling or deformity. Normal range of motion.     Cervical back: Normal range of motion.  Skin:    General: Skin is warm and dry.     Capillary Refill: Capillary refill takes less than 2 seconds.  Neurological:     General: No focal deficit present.     Mental Status: He is alert and oriented to person, place, and time.     Motor: No weakness.  Psychiatric:        Mood and Affect: Mood normal.        Behavior: Behavior normal.        Thought Content: Thought content normal.   Last CBC Lab Results  Component Value Date   WBC 7.8 11/26/2023   HGB 14.9 11/26/2023   HCT 45.0 11/26/2023   MCV 86 11/26/2023   MCH 28.4 11/26/2023   RDW 15.1 11/26/2023   PLT 233 11/26/2023   Last metabolic panel Lab Results  Component Value Date   GLUCOSE 124 (H) 11/26/2023   NA 139 11/26/2023   K 4.5 11/26/2023   CL 100 11/26/2023   CO2 22 11/26/2023   BUN 17 11/26/2023   CREATININE 1.14 11/26/2023   EGFR 71 11/26/2023   CALCIUM  9.7 11/26/2023   PHOS 3.6 10/26/2022   PROT 6.8 11/26/2023   ALBUMIN  4.7 11/26/2023   LABGLOB 2.1 11/26/2023   AGRATIO 2.1 02/11/2023   BILITOT 0.4 11/26/2023   ALKPHOS 92 11/26/2023   AST 16 11/26/2023   ALT 19 11/26/2023   ANIONGAP 11 09/04/2023   Last lipids Lab Results  Component Value Date   CHOL 176 12/31/2023   HDL 33 (L) 12/31/2023   LDLCALC 99 12/31/2023   TRIG 261 (H) 12/31/2023    CHOLHDL 5.3 (H) 12/31/2023   Last hemoglobin A1c Lab Results  Component Value Date   HGBA1C 7.4 (H) 11/23/2023   Last thyroid  functions Lab Results  Component Value Date   TSH 1.530 06/06/2022   Last vitamin B12 and Folate Lab Results  Component Value Date   VITAMINB12 632 06/06/2022   FOLATE 3.5 06/06/2022   The 10-year ASCVD risk score (Arnett DK, et al., 2019) is: 47.6%    Assessment & Plan:   Problem List Items Addressed This Visit       OSA (obstructive sleep apnea) (Chronic)   He continues to endorse nightly compliance with CPAP      COPD (chronic obstructive pulmonary disease) (HCC)   Asymptomatic currently.  Pulmonary exam unremarkable.  He uses albuterol  infrequently.      Type 2 diabetes mellitus with hyperglycemia (HCC)   A1c 7.4 on labs from March, improved from 14.3 previously.  He is currently prescribed Ozempic  0.5 mg weekly and Lantus  20 units nightly.  He stopped metformin  due to diarrhea.  He also reduced Lantus  to 10 units nightly if blood sugar readings are below 200 in the evening. -Increase Ozempic  to 1 mg weekly and discontinue Lantus  -Repeat A1c at follow-up in  3 months.      HLD (hyperlipidemia) - Primary (Chronic)   Lipid panel last updated in May 2024.  Total cholesterol 148 and LDL 72.  He is currently prescribed rosuvastatin  20 mg daily.  Repeat lipid panel ordered today.       Return in about 3 months (around 04/01/2024).    Tobi Fortes, MD

## 2024-01-01 ENCOUNTER — Encounter: Payer: Self-pay | Admitting: Internal Medicine

## 2024-01-01 ENCOUNTER — Other Ambulatory Visit: Payer: Self-pay | Admitting: Internal Medicine

## 2024-01-01 DIAGNOSIS — E782 Mixed hyperlipidemia: Secondary | ICD-10-CM

## 2024-01-01 LAB — LIPID PANEL
Chol/HDL Ratio: 5.3 ratio — ABNORMAL HIGH (ref 0.0–5.0)
Cholesterol, Total: 176 mg/dL (ref 100–199)
HDL: 33 mg/dL — ABNORMAL LOW (ref >39)
LDL Chol Calc (NIH): 99 mg/dL (ref 0–99)
Triglycerides: 261 mg/dL — ABNORMAL HIGH (ref 0–149)
VLDL Cholesterol Cal: 44 mg/dL — ABNORMAL HIGH (ref 5–40)

## 2024-01-01 MED ORDER — ROSUVASTATIN CALCIUM 40 MG PO TABS: 40.0000 mg | ORAL_TABLET | Freq: Every day | ORAL | 3 refills | Status: AC

## 2024-01-01 MED ORDER — SEMAGLUTIDE (1 MG/DOSE) 4 MG/3ML ~~LOC~~ SOPN: 1.0000 mg | PEN_INJECTOR | SUBCUTANEOUS | 2 refills | Status: DC

## 2024-01-01 NOTE — Assessment & Plan Note (Signed)
 Lipid panel last updated in May 2024.  Total cholesterol 148 and LDL 72.  He is currently prescribed rosuvastatin  20 mg daily.  Repeat lipid panel ordered today.

## 2024-01-01 NOTE — Assessment & Plan Note (Signed)
 Asymptomatic currently.  Pulmonary exam unremarkable.  He uses albuterol  infrequently.

## 2024-01-01 NOTE — Assessment & Plan Note (Signed)
 A1c 7.4 on labs from March, improved from 14.3 previously.  He is currently prescribed Ozempic  0.5 mg weekly and Lantus  20 units nightly.  He stopped metformin  due to diarrhea.  He also reduced Lantus  to 10 units nightly if blood sugar readings are below 200 in the evening. -Increase Ozempic  to 1 mg weekly and discontinue Lantus  -Repeat A1c at follow-up in 3 months.

## 2024-01-01 NOTE — Assessment & Plan Note (Signed)
He continues to endorse nightly compliance with CPAP. 

## 2024-01-09 ENCOUNTER — Ambulatory Visit
Admission: RE | Admit: 2024-01-09 | Discharge: 2024-01-09 | Disposition: A | Discharge status: Home / Self Care | Source: Ambulatory Visit | Attending: Student | Admitting: Student

## 2024-01-09 DIAGNOSIS — M5416 Radiculopathy, lumbar region: Secondary | ICD-10-CM

## 2024-01-11 ENCOUNTER — Telehealth: Payer: Self-pay

## 2024-01-11 NOTE — Telephone Encounter (Signed)
 Copied from CRM [id number]. Topic: General - Other >> Jan 11, 2024  9:24 AM Christopher [name] T wrote: Reason for CRM: patients spouse was returning call. Please f/u with patient

## 2024-01-11 NOTE — Telephone Encounter (Signed)
 Pt advised lab results with verbal understanding

## 2024-01-18 ENCOUNTER — Ambulatory Visit

## 2024-01-18 DIAGNOSIS — L501 Idiopathic urticaria: Secondary | ICD-10-CM

## 2024-02-03 ENCOUNTER — Other Ambulatory Visit: Payer: Self-pay

## 2024-02-03 ENCOUNTER — Ambulatory Visit: Admitting: Orthopedic Surgery

## 2024-02-03 DIAGNOSIS — M67431 Ganglion, right wrist: Secondary | ICD-10-CM

## 2024-02-05 ENCOUNTER — Other Ambulatory Visit: Payer: Self-pay | Admitting: Internal Medicine

## 2024-02-05 DIAGNOSIS — E1165 Type 2 diabetes mellitus with hyperglycemia: Secondary | ICD-10-CM

## 2024-02-06 ENCOUNTER — Encounter: Payer: Self-pay | Admitting: Orthopedic Surgery

## 2024-02-06 NOTE — Progress Notes (Signed)
 Office Visit Note   Patient: Christopher [name]           Date of Birth: Jan 29, 1956           MRN: [id number] Visit Date: 02/03/2024 Requested by: Tobi Fortes, MD [address] [place],  [place] [address] PCP: Tobi Fortes, MD  Subjective: Chief Complaint  Patient presents with   Right Wrist - Cyst, Pain    HPI: Christopher [name] is a 68 y.o. male who presents to the office reporting right wrist pain.  Has pain around the base of the thumb.  Has had history of prior cyst excision there.  Does have pain with pinch.  Not too much numbness and tingling..                ROS: All systems reviewed are negative as they relate to the chief complaint within the history of present illness.  Patient denies fevers or chills.  Assessment & Plan: Visit Diagnoses:  1. Ganglion of right wrist     Plan: Impression is possible small recurrent ganglion on the volar radial aspect of the wrist.  Ultrasound confirms cystic structure beside the artery.  Not too much CMC arthritis with negative grind test.  Plan MRI right wrist to evaluate CMC pain of the thumb versus recurrent ganglion.  Follow-up after that study.  Follow-Up Instructions: No follow-ups on file.   Orders:  Orders Placed This Encounter  Procedures   XR Wrist Complete Right   No orders of the defined types were placed in this encounter.     Procedures: No procedures performed   Clinical Data: No additional findings.  Objective: Vital Signs: There were no vitals taken for this visit.  Physical Exam:  Constitutional: Patient appears well-developed HEENT:  Head: Normocephalic Eyes:EOM are normal Neck: Normal range of motion Cardiovascular: Normal rate Pulmonary/chest: Effort normal Neurologic: Patient is alert Skin: Skin is warm Psychiatric: Patient has normal mood and affect  Ortho Exam: Ortho exam demonstrates well-healed surgical incision near the wrist flexion crease on the volar side.  Does like the  patient does have a recurrence very small ganglion cystic structure adjacent to the artery.  Confirmed on ultrasound.  EPL FPL interosseous strength is intact.  Pinching is a little bit painful.  Negative grind test bilaterally.  Specialty Comments:  No specialty comments available.  Imaging: No results found.   PMFS History: Patient Active Problem List   Diagnosis Date Noted   Pseudoarthrosis of cervical spine (HCC) 09/04/2023   Type 2 diabetes mellitus with hyperglycemia (HCC) 07/08/2023   Abdominal pain 06/12/2023   Prediabetes 01/05/2023   History of anaphylaxis 11/13/2022   Idiopathic urticaria 11/03/2022   Angioedema 10/24/2022   Spinal stenosis in cervical region 10/13/2022   DOE (dyspnea on exertion) 09/26/2022   Abnormal EKG 09/18/2022   Hypogonadism in male 07/07/2022   Depression, recurrent (HCC) 06/06/2022   Chronic musculoskeletal pain 06/06/2022   Fatigue 06/06/2022   Current tobacco use 06/06/2022   S/P right knee surgery 02/18/2022   S/P TKR (total knee replacement), right 05/14/21 07/02/2021   Status post total right knee replacement 05/14/2021   Primary osteoarthritis of right knee    Renal cyst 10/03/2020   Frequency of micturition 10/03/2020   Benign prostatic hyperplasia with urinary obstruction 10/03/2020   IBS (irritable bowel syndrome) 07/05/2020   Lumbar disc herniation 06/29/2019   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HCC)    Primary osteoarthritis, left shoulder  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   History of colonic polyps 11/26/2017   S/P total knee replacement, left 06/16/17 06/16/2017   Primary osteoarthritis of left knee    HLD (hyperlipidemia) 09/04/2016   Angina pectoris (HCC) 09/04/2016   OSA (obstructive sleep apnea) 09/04/2016   COPD (chronic obstructive pulmonary disease) (HCC) 09/04/2016   Rotator cuff tear 05/05/2014   S/P shoulder surgery 09/26/2013   Arthritis, shoulder region 09/26/2013   Bursitis, shoulder 09/26/2013    Synovitis of shoulder 09/26/2013   Labral tear of shoulder, degenerative 09/26/2013   Biceps tendon tear 09/26/2013   Rotator cuff syndrome of left shoulder 06/21/2013   Arthritis 06/21/2013   Patellofemoral arthritis of right knee 03/29/2013   Effusion of knee joint 03/29/2013   Bursitis/tendonitis, shoulder 03/10/2013   Effusion of knee joint, left 08/18/2011   Knee pain 08/18/2011   Acute torn meniscus 07/30/2011   Old torn meniscus of knee 07/30/2011   Arthropathy, lower leg 10/15/2010   MEDIAL MENISCUS TEAR, RIGHT 10/15/2010   HIP PAIN 01/15/2010   DEGENERATIVE DISC DISEASE, LUMBOSACRAL SPINE W/RADICULOPATHY 01/15/2010   PLICA SYNDROME 08/09/2009   DERANGEMENT MENISCUS 07/09/2009   JOINT EFFUSION, LEFT KNEE 07/09/2009   Unilateral primary osteoarthritis, left knee 01/30/2009   KNEE PAIN 01/30/2009   Sprain of ankle 11/08/2007   Past Medical History:  Diagnosis Date   Anxiety    Arthritis    Asthma    BPH (benign prostatic hyperplasia)    Complication of anesthesia    pt had a hard time being able to move after spinal anesthesia , 3-4 hours   COPD (chronic obstructive pulmonary disease) (HCC)    Depression    Diabetes mellitus without complication (HCC)    GERD (gastroesophageal reflux disease)    Gout    no meds   Headache(784.0)    otc meds prn   Heart murmur    dx as a child, no problems as an adult   History of COVID-19 2024   History of hiatal hernia    Hyperlipidemia    IBS (irritable bowel syndrome)    PONV (postoperative nausea and vomiting)    Sleep apnea    uses CIPAP machine at night   Wears partial dentures    bottom partial    Family History  Problem Relation Age of Onset   Diabetes Mother    Alzheimer's disease Father    Diabetes Father    Diabetes Other    Lung disease Other    Arthritis Other    Anesthesia problems Neg Hx    Hypotension Neg Hx    Malignant hyperthermia Neg Hx    Pseudochol deficiency Neg Hx    Sleep apnea Neg Hx      Past Surgical History:  Procedure Laterality Date   ANTERIOR CERVICAL DECOMP/DISCECTOMY FUSION N/A 10/13/2022   Procedure: ANTERIOR CERVICAL DISECTOMY FUSION - CERVICAL THREE-CERVICAL FOUR - CERVICAL FOUR-CERVICAL FIVE REMOVAL OF HARDWARE CERVICAL FIVE-CERVICAL SIX;  Surgeon: Gearl Keens, MD;  [facility];  Service: Neurosurgery;  Laterality: N/A;   APPENDECTOMY     BACK SURGERY  2002   neck and back fusion   BIOPSY  12/27/2015   Procedure: BIOPSY;  Surgeon: Ruby Corporal, MD;  [facility];  Service: Endoscopy;;  Fundus biopsies and duodenal biopsies   BIOPSY  02/10/2020   Procedure: BIOPSY;  Surgeon: Ruby Corporal, MD;  [facility];  Service: Endoscopy;;  antral   CARDIAC CATHETERIZATION     CARDIAC CATHETERIZATION N/A  09/04/2016   Procedure: Right/Left Heart Cath and Coronary Angiography;  Surgeon: Peter M Swaziland, MD;  Location: [facility] INVASIVE CV LAB;  Service: Cardiovascular;  Laterality: N/A;   CHOLECYSTECTOMY     CHONDROPLASTY  08/15/2011   Procedure: CHONDROPLASTY;  Surgeon: Elsa Halls, MD;  Location: [facility];  Service: Orthopedics;  Laterality: Left;   COLONOSCOPY  06/27/2011   Procedure: COLONOSCOPY;  Surgeon: Ruby Corporal, MD;  [facility];  Service: Endoscopy;  Laterality: N/A;  9:00 / Pt to be here at 9am for 10:45 procedure, benign polyps removed   COLONOSCOPY N/A 10/05/2014   Procedure: COLONOSCOPY;  Surgeon: Ruby Corporal, MD;  [facility];  Service: Endoscopy;  Laterality: N/A;  930   COLONOSCOPY N/A 02/11/2018   Procedure: COLONOSCOPY;  Surgeon: Ruby Corporal, MD;  [facility];  Service: Endoscopy;  Laterality: N/A;  830   COLONOSCOPY N/A 12/02/2023   Procedure: COLONOSCOPY;  Surgeon: Vinetta Greening, DO;  [facility];  Service: Endoscopy;  Laterality: N/A;  845am, asa 3   ESOPHAGOGASTRODUODENOSCOPY N/A 12/27/2015   Procedure: ESOPHAGOGASTRODUODENOSCOPY (EGD);  Surgeon: Ruby Corporal, MD;  [facility];  Service: Endoscopy;  Laterality: N/A;  3:00   ESOPHAGOGASTRODUODENOSCOPY (EGD) WITH PROPOFOL  N/A 02/10/2020   Procedure: ESOPHAGOGASTRODUODENOSCOPY (EGD) WITH PROPOFOL ;  Surgeon: Ruby Corporal, MD;  [facility];  Service: Endoscopy;  Laterality: N/A;  155   HERNIA REPAIR  1998   umbilical hernia   JOINT REPLACEMENT  2023   knee and shoulder-bilateral knees and left shoulder   KNEE ARTHROSCOPY     left knee   KNEE ARTHROSCOPY     right knee    LUMBAR LAMINECTOMY/DECOMPRESSION MICRODISCECTOMY  09/14/2012   Procedure: LUMBAR LAMINECTOMY/DECOMPRESSION MICRODISCECTOMY 1 LEVEL;  Surgeon: Adelbert Adler, MD;  [facility];  Service: Neurosurgery;  Laterality: Right;  Right Lumbar three-four Diskectomy   neck fusion  2005   NECK SURGERY     PATELLA-FEMORAL ARTHROPLASTY Right 02/18/2022   Procedure: RIGHT KNEE PATELLA REPLACEMENT, CEMENTED;  Surgeon: Jasmine Mesi, MD;  [facility];  Service: Orthopedics;  Laterality: Right;   POLYPECTOMY  02/11/2018   Procedure: POLYPECTOMY;  Surgeon: Ruby Corporal, MD;  [facility];  Service: Endoscopy;;  colon   POLYPECTOMY  12/02/2023   Procedure: POLYPECTOMY;  Surgeon: Vinetta Greening, DO;  [facility];  Service: Endoscopy;;   POSTERIOR CERVICAL FUSION/FORAMINOTOMY N/A 09/04/2023   Procedure: Posterior Cervical Fusion, Cervical Five-Cervical Four and Cervical Three-Cervical Three-Cervical Four;  Surgeon: Gearl Keens, MD;  Location: [facility] OR;  Service: Neurosurgery;  Laterality: N/A;   REVISION TOTAL SHOULDER TO REVERSE TOTAL SHOULDER Left 12/16/2018   Procedure: REVISION TOTAL SHOULDER TO REVERSE TOTAL SHOULDER;  Surgeon: Jasmine Mesi, MD;  Location: [facility] OR;  Service: Orthopedics;  Laterality: Left;   SHOULDER ARTHROSCOPY WITH BICEPSTENOTOMY Left 09/23/2013   Procedure: SHOULDER ARTHROSCOPY WITH BICEPSTENOTOMY AND EXTENSIVE DEBRIDEMENT;  Surgeon: Darrin Emerald, MD;  [facility]: [facility];  Service: Orthopedics;  Laterality: Left;   SHOULDER ARTHROSCOPY WITH ROTATOR CUFF REPAIR Left 05/05/2014   Procedure: SHOULDER ARTHROSCOPY LIMITED DEBRIDEMENT;  Surgeon: Darrin Emerald, MD;  Location: [facility];  Service: Orthopedics;  Laterality: Left;   SHOULDER OPEN ROTATOR CUFF REPAIR Left 05/05/2014   Procedure: ROTATOR CUFF REPAIR SHOULDER OPEN;  Surgeon: Darrin Emerald, MD;  Location: [facility];  Service: Orthopedics;  Laterality: Left;   SHOULDER OPEN ROTATOR  CUFF REPAIR Left 12/16/2018   Procedure: LEFT SHOULDER POSSIBLE SUBSCAPULARIS REPAIR VS. REVISION TO REVERSE TOTAL SHOULDER REPLACEMENT;  Surgeon: Jasmine Mesi, MD;  Location: [facility];  Service: Orthopedics;  Laterality: Left;   SHOULDER SURGERY Right    Open Mumford procedure   SPINAL FUSION     x2, 2003 and 2007   TOTAL KNEE ARTHROPLASTY Left 06/16/2017   Procedure: LEFT TOTAL KNEE ARTHROPLASTY;  Surgeon: Darrin Emerald, MD;  [facility]: [facility];  Service: Orthopedics;  Laterality: Left;   TOTAL KNEE ARTHROPLASTY Right 05/14/2021   Procedure: TOTAL KNEE ARTHROPLASTY;  Surgeon: Darrin Emerald, MD;  Location: [facility];  Service: Orthopedics;  Laterality: Right;   TOTAL SHOULDER ARTHROPLASTY Left 08/19/2018   Procedure: left shoulder replacement;  Surgeon: Jasmine Mesi, MD;  Location: [facility] OR;  Service: Orthopedics;  Laterality: Left;   Social History   Occupational History   Occupation: Event organiser: DISABLED  Tobacco Use   Smoking status: Every Day    Current packs/day: 1.00    Average packs/day: 1 pack/day for 45.4 years (45.4 ttl pk-yrs)    Types: Cigarettes    Start date: 08/25/1978    Passive exposure: Current   Smokeless tobacco: Never   Tobacco comments:    smokes a pack a day. since age 50  Vaping Use   Vaping status: Never Used  Substance and Sexual Activity   Alcohol use: Never   Drug use: Never   Sexual activity: Yes

## 2024-02-15 ENCOUNTER — Ambulatory Visit (INDEPENDENT_AMBULATORY_CARE_PROVIDER_SITE_OTHER)

## 2024-02-15 DIAGNOSIS — L501 Idiopathic urticaria: Secondary | ICD-10-CM | POA: Diagnosis not present

## 2024-02-18 ENCOUNTER — Ambulatory Visit
Admission: RE | Admit: 2024-02-18 | Discharge: 2024-02-18 | Disposition: A | Discharge status: Home / Self Care | Source: Ambulatory Visit | Attending: Orthopedic Surgery | Admitting: Orthopedic Surgery

## 2024-02-18 DIAGNOSIS — M67431 Ganglion, right wrist: Secondary | ICD-10-CM

## 2024-03-14 ENCOUNTER — Ambulatory Visit

## 2024-03-14 DIAGNOSIS — L501 Idiopathic urticaria: Secondary | ICD-10-CM

## 2024-03-16 ENCOUNTER — Encounter: Payer: Self-pay | Admitting: Orthopedic Surgery

## 2024-03-16 ENCOUNTER — Ambulatory Visit: Admitting: Orthopedic Surgery

## 2024-03-16 DIAGNOSIS — M67431 Ganglion, right wrist: Secondary | ICD-10-CM | POA: Diagnosis not present

## 2024-03-16 NOTE — Progress Notes (Signed)
 Office Visit Note   Patient: Christopher [name]           Date of Birth: [birth year]           MRN: [id number] Visit Date: 03/16/2024 Requested by: Melvenia Manus BRAVO, MD [address] [place],  [place] [address] PCP: No primary care provider on file.  Subjective: Chief Complaint  Patient presents with   Right Wrist - Follow-up    HPI: Christopher [name] is a 68 y.o. male who presents to the office reporting right wrist pain.  Since he was last seen has had an MRI scan of the right wrist which shows STT arthritis.  Also has a ganglion which is not really near his area of maximal tenderness which is just proximal to the [facility] joint.  Not taking any pain meds.  No numbness and tingling in the hand.  He is very active driving a lawnmower..                ROS: All systems reviewed are negative as they relate to the chief complaint within the history of present illness.  Patient denies fevers or chills.  Assessment & Plan: Visit Diagnoses:  1. 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  Difficult injection.  Grind test negative today.  If he wanted surgical intervention I think I will have him see Dr. Brutus for consideration of isolated fusion.  He is going to try a brace today.  I do not think the ganglion is particularly symptomatic.  Follow-Up Instructions: No follow-ups on file.   Orders:  No orders of the defined types were placed in this encounter.  No orders of the defined types were placed in this encounter.     Procedures: No procedures performed   Clinical Data: No additional findings.  Objective: Vital Signs: There were no vitals taken for this visit.  Physical Exam:  Constitutional: Patient appears well-developed HEENT:  Head: Normocephalic Eyes:EOM are normal Neck: Normal range of motion Cardiovascular: Normal rate Pulmonary/chest: Effort normal Neurologic: Patient is alert Skin: Skin is warm Psychiatric: Patient  has normal mood and affect  Ortho Exam: Ortho exam demonstrates intact EPL FPL interosseous function both hands.  Has tenderness on the volar aspect just proximal to the [facility] joint.  Negative Finkelstein's negative grind test bilaterally.  Grip strength is intact.  MCP radial and ulnar collateral ligaments are stable.  EPL FPL intact.  Specialty Comments:  No specialty comments available.  Imaging: No results found.   PMFS History: Patient Active Problem List   Diagnosis Date Noted   Pseudoarthrosis of cervical spine (HCC) 09/04/2023   Type 2 diabetes mellitus with hyperglycemia (HCC) 07/08/2023   Abdominal pain 06/12/2023   Prediabetes 01/05/2023   History of anaphylaxis 11/13/2022   Idiopathic urticaria 11/03/2022   Angioedema 10/24/2022   Spinal stenosis in cervical region 10/13/2022   DOE (dyspnea on exertion) 09/26/2022   Abnormal EKG 09/18/2022   Hypogonadism in male 07/07/2022   Depression, recurrent (HCC) 06/06/2022   Chronic musculoskeletal pain 06/06/2022   Fatigue 06/06/2022   Current tobacco use 06/06/2022   S/P right knee surgery 02/18/2022   S/P TKR (total knee replacement), right 05/14/21 07/02/2021   Status post total right knee replacement 05/14/2021   Primary osteoarthritis of right knee    Renal cyst 10/03/2020   Frequency of micturition 10/03/2020   Benign prostatic hyperplasia with urinary obstruction 10/03/2020   IBS (  irritable bowel syndrome) 07/05/2020   Lumbar disc herniation 06/29/2019   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HCC)    Primary osteoarthritis, left shoulder    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   History of colonic polyps 11/26/2017   S/P total knee replacement, left 06/16/17 06/16/2017   Primary osteoarthritis of left knee    HLD (hyperlipidemia) 09/04/2016   Angina pectoris (HCC) 09/04/2016   OSA (obstructive sleep apnea) 09/04/2016   COPD (chronic obstructive pulmonary disease) (HCC) 09/04/2016   Rotator cuff tear 05/05/2014    S/P shoulder surgery 09/26/2013   Arthritis, shoulder region 09/26/2013   Bursitis, shoulder 09/26/2013   Synovitis of shoulder 09/26/2013   Labral tear of shoulder, degenerative 09/26/2013   Biceps tendon tear 09/26/2013   Rotator cuff syndrome of left shoulder 06/21/2013   Arthritis 06/21/2013   Patellofemoral arthritis of right knee 03/29/2013   Effusion of knee joint 03/29/2013   Bursitis/tendonitis, shoulder 03/10/2013   Effusion of knee joint, left 08/18/2011   Knee pain 08/18/2011   Acute torn meniscus 07/30/2011   Old torn meniscus of knee 07/30/2011   Arthropathy, lower leg 10/15/2010   MEDIAL MENISCUS TEAR, RIGHT 10/15/2010   HIP PAIN 01/15/2010   DEGENERATIVE DISC DISEASE, LUMBOSACRAL SPINE W/RADICULOPATHY 01/15/2010   PLICA SYNDROME 08/09/2009   DERANGEMENT MENISCUS 07/09/2009   JOINT EFFUSION, LEFT KNEE 07/09/2009   Unilateral primary osteoarthritis, left knee 01/30/2009   KNEE PAIN 01/30/2009   Sprain of ankle 11/08/2007   Past Medical History:  Diagnosis Date   Anxiety    Arthritis    Asthma    BPH (benign prostatic hyperplasia)    Complication of anesthesia    pt had a hard time being able to move after spinal anesthesia , 3-4 hours   COPD (chronic obstructive pulmonary disease) (HCC)    Depression    Diabetes mellitus without complication (HCC)    GERD (gastroesophageal reflux disease)    Gout    no meds   Headache(784.0)    otc meds prn   Heart murmur    dx as a child, no problems as an adult   History of COVID-19 2024   History of hiatal hernia    Hyperlipidemia    IBS (irritable bowel syndrome)    PONV (postoperative nausea and vomiting)    Sleep apnea    uses CIPAP machine at night   Wears partial dentures    bottom partial    Family History  Problem Relation Age of Onset   Diabetes Mother    Alzheimer's disease Father    Diabetes Father    Diabetes Other    Lung disease Other    Arthritis Other    Anesthesia problems Neg Hx     Hypotension Neg Hx    Malignant hyperthermia Neg Hx    Pseudochol deficiency Neg Hx    Sleep apnea Neg Hx     Past Surgical History:  Procedure Laterality Date   ANTERIOR CERVICAL DECOMP/DISCECTOMY FUSION N/A 10/13/2022   Procedure: ANTERIOR CERVICAL DISECTOMY FUSION - CERVICAL THREE-CERVICAL FOUR - CERVICAL FOUR-CERVICAL FIVE REMOVAL OF HARDWARE CERVICAL FIVE-CERVICAL SIX;  Surgeon: [name], MD;  Location: [facility];  Service: Neurosurgery;  Laterality: N/A;   APPENDECTOMY     BACK SURGERY  2002   neck and back fusion   BIOPSY  12/27/2015   Procedure: BIOPSY;  Surgeon: Claudis RAYMOND Rivet, MD;  [facility];  Service: Endoscopy;;  Fundus biopsies and duodenal biopsies   BIOPSY  02/10/2020   Procedure:  BIOPSY;  Surgeon: Golda Claudis PENNER, MD;  [facility];  Service: Endoscopy;;  antral   CARDIAC CATHETERIZATION     CARDIAC CATHETERIZATION N/A 09/04/2016   Procedure: Right/Left Heart Cath and Coronary Angiography;  Surgeon: Peter M Swaziland, MD;  Location: [facility] INVASIVE CV LAB;  Service: Cardiovascular;  Laterality: N/A;   CHOLECYSTECTOMY     CHONDROPLASTY  08/15/2011   Procedure: CHONDROPLASTY;  Surgeon: Taft Minerva, MD;  Location: [facility];  Service: Orthopedics;  Laterality: Left;   COLONOSCOPY  06/27/2011   Procedure: COLONOSCOPY;  Surgeon: Claudis PENNER Golda, MD;  [facility];  Service: Endoscopy;  Laterality: N/A;  9:00 / Pt to be here at 9am for 10:45 procedure, benign polyps removed   COLONOSCOPY N/A 10/05/2014   Procedure: COLONOSCOPY;  Surgeon: Claudis PENNER Golda, MD;  [facility];  Service: Endoscopy;  Laterality: N/A;  930   COLONOSCOPY N/A 02/11/2018   Procedure: COLONOSCOPY;  Surgeon: Golda Claudis PENNER, MD;  [facility];  Service: Endoscopy;  Laterality: N/A;  830   COLONOSCOPY N/A 12/02/2023   Procedure: COLONOSCOPY;  Surgeon: Cindie Carlin POUR, DO;  [facility];  Service: Endoscopy;  Laterality: N/A;  845am, asa 3    ESOPHAGOGASTRODUODENOSCOPY N/A 12/27/2015   Procedure: ESOPHAGOGASTRODUODENOSCOPY (EGD);  Surgeon: Claudis PENNER Golda, MD;  [facility];  Service: Endoscopy;  Laterality: N/A;  3:00   ESOPHAGOGASTRODUODENOSCOPY (EGD) WITH PROPOFOL  N/A 02/10/2020   Procedure: ESOPHAGOGASTRODUODENOSCOPY (EGD) WITH PROPOFOL ;  Surgeon: Golda Claudis PENNER, MD;  [facility];  Service: Endoscopy;  Laterality: N/A;  155   HERNIA REPAIR  1998   umbilical hernia   JOINT REPLACEMENT  2023   knee and shoulder-bilateral knees and left shoulder   KNEE ARTHROSCOPY     left knee   KNEE ARTHROSCOPY     right knee    LUMBAR LAMINECTOMY/DECOMPRESSION MICRODISCECTOMY  09/14/2012   Procedure: LUMBAR LAMINECTOMY/DECOMPRESSION MICRODISCECTOMY 1 LEVEL;  Surgeon: Catalina CHRISTELLA Stains, MD;  [facility];  Service: Neurosurgery;  Laterality: Right;  Right Lumbar three-four Diskectomy   neck fusion  2005   NECK SURGERY     PATELLA-FEMORAL ARTHROPLASTY Right 02/18/2022   Procedure: RIGHT KNEE PATELLA REPLACEMENT, CEMENTED;  Surgeon: Addie Cordella Hamilton, MD;  Location: [facility];  Service: Orthopedics;  Laterality: Right;   POLYPECTOMY  02/11/2018   Procedure: POLYPECTOMY;  Surgeon: Golda Claudis PENNER, MD;  [facility];  Service: Endoscopy;;  colon   POLYPECTOMY  12/02/2023   Procedure: POLYPECTOMY;  Surgeon: Cindie Carlin POUR, DO;  [facility];  Service: Endoscopy;;   POSTERIOR CERVICAL FUSION/FORAMINOTOMY N/A 09/04/2023   Procedure: Posterior Cervical Fusion, Cervical Five-Cervical Four and Cervical Three-Cervical Three-Cervical Four;  Surgeon: [name], MD;  Location: [facility] OR;  Service: Neurosurgery;  Laterality: N/A;   REVISION TOTAL SHOULDER TO REVERSE TOTAL SHOULDER Left 12/16/2018   Procedure: REVISION TOTAL SHOULDER TO REVERSE TOTAL SHOULDER;  Surgeon: Addie Cordella Hamilton, MD;  Location: [facility] OR;  Service: Orthopedics;  Laterality: Left;   SHOULDER ARTHROSCOPY WITH BICEPSTENOTOMY Left  09/23/2013   Procedure: SHOULDER ARTHROSCOPY WITH BICEPSTENOTOMY AND EXTENSIVE DEBRIDEMENT;  Surgeon: Taft FORBES Minerva, MD;  Location: [facility];  Service: Orthopedics;  Laterality: Left;   SHOULDER ARTHROSCOPY WITH ROTATOR CUFF REPAIR Left 05/05/2014   Procedure: SHOULDER ARTHROSCOPY LIMITED DEBRIDEMENT;  Surgeon: Taft FORBES Minerva, MD;  Location: [facility];  Service: Orthopedics;  Laterality: Left;   SHOULDER OPEN ROTATOR CUFF REPAIR Left 05/05/2014  Procedure: ROTATOR CUFF REPAIR SHOULDER OPEN;  Surgeon: Taft FORBES Minerva, MD;  Location: [facility];  Service: Orthopedics;  Laterality: Left;   SHOULDER OPEN ROTATOR CUFF REPAIR Left 12/16/2018   Procedure: LEFT SHOULDER POSSIBLE SUBSCAPULARIS REPAIR VS. REVISION TO REVERSE TOTAL SHOULDER REPLACEMENT;  Surgeon: Addie Cordella Hamilton, MD;  Location: [facility] OR;  Service: Orthopedics;  Laterality: Left;   SHOULDER SURGERY Right    Open Mumford procedure   SPINAL FUSION     x2, 2003 and 2007   TOTAL KNEE ARTHROPLASTY Left 06/16/2017   Procedure: LEFT TOTAL KNEE ARTHROPLASTY;  Surgeon: Minerva Taft FORBES, MD;  Location: [facility];  Service: Orthopedics;  Laterality: Left;   TOTAL KNEE ARTHROPLASTY Right 05/14/2021   Procedure: TOTAL KNEE ARTHROPLASTY;  Surgeon: Minerva Taft FORBES, MD;  Location: [facility];  Service: Orthopedics;  Laterality: Right;   TOTAL SHOULDER ARTHROPLASTY Left 08/19/2018   Procedure: left shoulder replacement;  Surgeon: Addie Cordella Hamilton, MD;  Location: [facility] OR;  Service: Orthopedics;  Laterality: Left;   Social History   Occupational History   Occupation: Event organiser: DISABLED  Tobacco Use   Smoking status: Every Day    Current packs/day: 1.00    Average packs/day: 1 pack/day for 45.6 years (45.6 ttl pk-yrs)    Types: Cigarettes    Start date: 08/25/1978    Passive exposure: Current   Smokeless tobacco: Never   Tobacco comments:    smokes a pack a day. since age 52  Vaping Use   Vaping status: Never Used   Substance and Sexual Activity   Alcohol use: Never   Drug use: Never   Sexual activity: Yes

## 2024-04-05 ENCOUNTER — Ambulatory Visit

## 2024-04-05 VITALS — BP 140/72 | HR 66 | Ht 66.0 in | Wt 223.1 lb

## 2024-04-05 DIAGNOSIS — Z6836 Body mass index (BMI) 36.0-36.9, adult: Secondary | ICD-10-CM

## 2024-04-05 DIAGNOSIS — E1165 Type 2 diabetes mellitus with hyperglycemia: Secondary | ICD-10-CM

## 2024-04-05 DIAGNOSIS — E782 Mixed hyperlipidemia: Secondary | ICD-10-CM

## 2024-04-05 DIAGNOSIS — Z794 Long term (current) use of insulin: Secondary | ICD-10-CM | POA: Diagnosis not present

## 2024-04-05 DIAGNOSIS — I209 Angina pectoris, unspecified: Secondary | ICD-10-CM

## 2024-04-05 NOTE — Progress Notes (Signed)
 Established Patient Office Visit  Subjective   Patient ID: Christopher [name], male    DOB: 07-20-1956  Age: 68 y.o. MRN: [id number]  Chief Complaint  Patient presents with   Medical Management of Chronic Issues    Office visit follow up    HPI Mr. Dowty returns.  For follow-up.  He was last evaluated by Dr. Melvenia on Dec 31, 2023 for follow-up of diabetes mellitus.  Earlier this year he underwent surgery on his cervical spine on 1/3, discharged 1/4.  He he underwent colonoscopy on 4/2, which revealed 2 cecal polyps and 12 polyps in the transverse colon.  Repeat colonoscopy recommended for 1 year.  Today he reports experiencing recurrent angina.  This usually occurs with exertion.  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  He reports experiencing tightness in his chest which did improve with administration of nitroglycerin .  He has no other acute concerns to discuss.   ROS    Objective:     BP (!) 140/72 (BP Location: Left Arm, Patient Position: Sitting, Cuff Size: Normal)   Pulse 66   Ht 5' 6 (1.676 m)   Wt 223 lb 1.3 oz (101.2 kg)   SpO2 95%   BMI 36.01 kg/m  BP Readings from Last 3 Encounters:  04/05/24 (!) 140/72  12/31/23 (!) 146/78  12/02/23 (!) 112/39   Wt Readings from Last 3 Encounters:  04/05/24 223 lb 1.3 oz (101.2 kg)  12/31/23 228 lb 9.6 oz (103.7 kg)  12/02/23 228 lb 3.2 oz (103.5 kg)      Physical Exam Vitals and nursing note reviewed.  Constitutional:      Appearance: Normal appearance. He is obese.  HENT:     Head: Normocephalic.  Eyes:     Extraocular Movements: Extraocular movements intact.     Pupils: Pupils are equal, round, and reactive to light.  Cardiovascular:     Rate and Rhythm: Normal rate and regular rhythm.  Pulmonary:     Effort: Pulmonary effort is normal.     Breath sounds: Normal breath sounds.  Musculoskeletal:     Cervical back: Normal range of motion  and neck supple.  Neurological:     Mental Status: He is alert and oriented to person, place, and time.  Psychiatric:        Mood and Affect: Mood normal.        Thought Content: Thought content normal.     Last CBC Lab Results  Component Value Date   WBC 7.8 11/26/2023   HGB 14.9 11/26/2023   HCT 45.0 11/26/2023   MCV 86 11/26/2023   MCH 28.4 11/26/2023   RDW 15.1 11/26/2023   PLT 233 11/26/2023   Last metabolic panel Lab Results  Component Value Date   GLUCOSE 80 04/05/2024   NA 138 04/05/2024   K 5.2 04/05/2024   CL 100 04/05/2024   CO2 23 04/05/2024   BUN 15 04/05/2024   CREATININE 1.14 04/05/2024   EGFR 71 04/05/2024   CALCIUM  9.9 04/05/2024   PHOS 3.6 10/26/2022   PROT 6.7 04/05/2024   ALBUMIN  4.6 04/05/2024   LABGLOB 2.1 04/05/2024   AGRATIO 2.1 02/11/2023   BILITOT 0.5 04/05/2024   ALKPHOS 87 04/05/2024   AST 18 04/05/2024   ALT 18 04/05/2024   ANIONGAP 11 09/04/2023   Last lipids Lab Results  Component Value Date   CHOL 124 04/05/2024   HDL 37 (L)  04/05/2024   LDLCALC 64 04/05/2024   TRIG 126 04/05/2024   CHOLHDL 3.4 04/05/2024   Last hemoglobin A1c Lab Results  Component Value Date   HGBA1C 6.2 (H) 04/05/2024   Last thyroid  functions Lab Results  Component Value Date   TSH 1.530 06/06/2022   THYROIDAB <9 11/12/2022   Last vitamin D No results found for: 25OHVITD2, 25OHVITD3, VD25OH    The ASCVD Risk score (Arnett DK, et al., 2019) failed to calculate for the following reasons:   The valid total cholesterol range is 130 to 320 mg/dL    Assessment & Plan:   Problem List Items Addressed This Visit       Cardiovascular and Mediastinum   Angina pectoris [facility])   -The patient was seen by cardiology in 2018 for eval of intermittent CP and coronary calcifications seen on CT. Had cath 09/04/2016 showing diffuse mild nonobstructive disease, normal LV function EF 55-65%, normal right heart and LV filling pressures, normal cardiac output.  Medical therapy recommended.  He was last seen by Dr. Mallipeddi 09/26/2022 for preop evaluation.  Per note, Patient has chronic stable DOE since 2018 and has not improved or worsened.  He reported having 1 episode of chest heaviness with SOB and denied any bilateral lower extremity swelling. EKG showed sinus bradycardia and an incomplete right bundle branch block. LHC and RHC from 2018 showed diffuse mild nonobstructive CAD, normal LV function, normal right heart and LV filling pressures and normal cardiac output.  -Discussed need for complete smoking cessation, although with his son currently battling leukemia, he states now is not a good time to quit.   -Will update referral to cardiology today, since last visit was in 2018.  Advised to seek immediate evaluation in ER if experiences chest pain that does not improve with nitroglycerin .  He agrees to do so.      Relevant Orders   Ambulatory referral to Cardiology     Endocrine   Type 2 diabetes mellitus with hyperglycemia (HCC) - Primary   A1c 7.4 on labs from March, improved from 14.3 previously.  He is currently prescribed Ozempic  1 mg weekly and Lantus  20 units nightly.  He stopped metformin  due to diarrhea.  He also reduced Lantus  to 10 units nightly if blood sugar readings are below 200 in the evening. - No refills needed at this time.  We will increase Ozempic  to 2 mg when refills needed -Repeat A1c today.      Relevant Orders   CMP14+EGFR (Completed)   Hemoglobin A1c (Completed)     Other   HLD (hyperlipidemia) (Chronic)   He is currently prescribed rosuvastatin  20 mg daily.  Repeat lipid panel ordered today.      Relevant Orders   CMP14+EGFR (Completed)   Lipid panel (Completed)   Obesity, morbid (HCC)   He has lost 5 pounds since previous office visit in May.  Will plan on increasing Ozempic  to 2 mg when next refill is needed.  Continue to work on dietary changes, including limiting excessive carbs, concentrated sweets and  alcohol.       No follow-ups on file.    Leita Longs, FNP

## 2024-04-06 ENCOUNTER — Other Ambulatory Visit: Payer: Self-pay

## 2024-04-06 LAB — CMP14+EGFR
ALT: 18 IU/L (ref 0–44)
AST: 18 IU/L (ref 0–40)
Albumin: 4.6 g/dL (ref 3.9–4.9)
Alkaline Phosphatase: 87 IU/L (ref 44–121)
BUN/Creatinine Ratio: 13 (ref 10–24)
BUN: 15 mg/dL (ref 8–27)
Bilirubin Total: 0.5 mg/dL (ref 0.0–1.2)
CO2: 23 mmol/L (ref 20–29)
Calcium: 9.9 mg/dL (ref 8.6–10.2)
Chloride: 100 mmol/L (ref 96–106)
Creatinine, Ser: 1.14 mg/dL (ref 0.76–1.27)
Globulin, Total: 2.1 g/dL (ref 1.5–4.5)
Glucose: 80 mg/dL (ref 70–99)
Potassium: 5.2 mmol/L (ref 3.5–5.2)
Sodium: 138 mmol/L (ref 134–144)
Total Protein: 6.7 g/dL (ref 6.0–8.5)
eGFR: 71 mL/min/1.73 (ref >59)

## 2024-04-06 LAB — HEMOGLOBIN A1C
Est. average glucose Bld gHb Est-mCnc: 131 mg/dL
Hgb A1c MFr Bld: 6.2 % — ABNORMAL HIGH (ref 4.8–5.6)

## 2024-04-06 LAB — LIPID PANEL
Chol/HDL Ratio: 3.4 ratio (ref 0.0–5.0)
Cholesterol, Total: 124 mg/dL (ref 100–199)
HDL: 37 mg/dL — ABNORMAL LOW (ref >39)
LDL Chol Calc (NIH): 64 mg/dL (ref 0–99)
Triglycerides: 126 mg/dL (ref 0–149)
VLDL Cholesterol Cal: 23 mg/dL (ref 5–40)

## 2024-04-11 ENCOUNTER — Ambulatory Visit

## 2024-04-14 ENCOUNTER — Ambulatory Visit: Payer: Self-pay

## 2024-04-14 ENCOUNTER — Telehealth: Payer: Self-pay

## 2024-04-14 NOTE — Telephone Encounter (Signed)
 Copied from CRM [id number]. Topic: Referral - Status >> Apr 14, 2024  9:28 AM Herma G wrote: Reason for CRM: [name] called regarding the status of pt's referral to a clinic due to his recent chest pains, I could not find any info to relay so please give her a call back at [phone] to let her know about the status of request, pt was last seen on 8/5 at clinic and states that he spoke to PCP, Dr. Bevely about this issue, pt's wife also stated that she feels concerned about the chest pain.

## 2024-04-14 NOTE — Assessment & Plan Note (Signed)
 He has lost 5 pounds since previous office visit in May.  Will plan on increasing Ozempic  to 2 mg when next refill is needed.  Continue to work on dietary changes, including limiting excessive carbs, concentrated sweets and alcohol.

## 2024-04-14 NOTE — Assessment & Plan Note (Signed)
 He is currently prescribed rosuvastatin  20 mg daily.  Repeat lipid panel ordered today.

## 2024-04-14 NOTE — Telephone Encounter (Signed)
 Spoke with patient wife.  He has an appointment scheduled.

## 2024-04-14 NOTE — Assessment & Plan Note (Addendum)
 A1c 7.4 on labs from March, improved from 14.3 previously.  He is currently prescribed Ozempic  1 mg weekly and Lantus  20 units nightly.  He stopped metformin  due to diarrhea.  He also reduced Lantus  to 10 units nightly if blood sugar readings are below 200 in the evening. - No refills needed at this time.  We will increase Ozempic  to 2 mg when refills needed -Repeat A1c today.

## 2024-04-14 NOTE — Assessment & Plan Note (Addendum)
-  The patient was seen by cardiology in 2018 for eval of intermittent CP and coronary calcifications seen on CT. Had cath 09/04/2016 showing diffuse mild nonobstructive disease, normal LV function EF 55-65%, normal right heart and LV filling pressures, normal cardiac output. Medical therapy recommended.  He was last seen by Dr. Mallipeddi 09/26/2022 for preop evaluation.  Per note, Patient has chronic stable DOE since 2018 and has not improved or worsened.  He reported having 1 episode of chest heaviness with SOB and denied any bilateral lower extremity swelling. EKG showed sinus bradycardia and an incomplete right bundle branch block. LHC and RHC from 2018 showed diffuse mild nonobstructive CAD, normal LV function, normal right heart and LV filling pressures and normal cardiac output.  -Discussed need for complete smoking cessation, although with his son currently battling leukemia, he states now is not a good time to quit.   -Will update referral to cardiology today, since last visit was in 2018.  Advised to seek immediate evaluation in ER if experiences chest pain that does not improve with nitroglycerin .  He agrees to do so.

## 2024-04-15 ENCOUNTER — Telehealth: Payer: Self-pay

## 2024-04-15 NOTE — Telephone Encounter (Signed)
 Called pt to let him know that His labs showed very good control of blood sugar and cholesterol levels. His A1c was 6.2. This is outstanding, considering it was 14.3 just 9 months ago. Continue with current medications. Recommend increasing his Ozempic  to 2 mg when he needs next refill. All other labs are stable including kidney and liver function.

## 2024-04-18 ENCOUNTER — Ambulatory Visit (INDEPENDENT_AMBULATORY_CARE_PROVIDER_SITE_OTHER)

## 2024-04-18 DIAGNOSIS — L501 Idiopathic urticaria: Secondary | ICD-10-CM | POA: Diagnosis not present

## 2024-04-20 ENCOUNTER — Other Ambulatory Visit: Payer: Self-pay

## 2024-04-20 DIAGNOSIS — E1165 Type 2 diabetes mellitus with hyperglycemia: Secondary | ICD-10-CM

## 2024-04-22 ENCOUNTER — Other Ambulatory Visit: Payer: Self-pay

## 2024-04-22 DIAGNOSIS — E1165 Type 2 diabetes mellitus with hyperglycemia: Secondary | ICD-10-CM

## 2024-04-22 MED ORDER — ACCU-CHEK GUIDE TEST VI STRP
1.0000 | ORAL_STRIP | Freq: Every day | 8 refills | Status: DC
Start: 2024-04-22 — End: 2024-04-26

## 2024-04-25 ENCOUNTER — Ambulatory Visit: Payer: PPO | Admitting: Internal Medicine

## 2024-04-25 ENCOUNTER — Encounter: Payer: Self-pay | Admitting: Internal Medicine

## 2024-04-25 VITALS — BP 142/74 | HR 94 | Temp 97.8°F | Resp 18 | Ht 64.0 in | Wt 224.1 lb

## 2024-04-25 DIAGNOSIS — L501 Idiopathic urticaria: Secondary | ICD-10-CM

## 2024-04-25 MED ORDER — FAMOTIDINE 40 MG PO TABS: 40.0000 mg | ORAL_TABLET | Freq: Two times a day (BID) | ORAL | 5 refills | Status: AC | PRN

## 2024-04-25 NOTE — Progress Notes (Signed)
   FOLLOW UP Date of Service/Encounter:  04/25/24   Subjective:  Christopher [name] (DOB: 15-Oct-1955) is a 68 y.o. male who [facility] for follow up for idiopathic urticaria.    History obtained from: chart review and patient. Last visit was with us  on 10/26/2023 and at the time was doing well on Xolair , high dose anti histamines, Singulair , Hydroxyzine .  Since last visit, has been off all oral medications and has not had trouble with hives. Still taking Xolair  and doing well on it.  Planning for surgery soon and is worried about hive flare up.   Past Medical History: Past Medical History:  Diagnosis Date   Anxiety    Arthritis    Asthma    BPH (benign prostatic hyperplasia)    Complication of anesthesia    pt had a hard time being able to move after spinal anesthesia , 3-4 hours   COPD (chronic obstructive pulmonary disease) (HCC)    Depression    Diabetes mellitus without complication (HCC)    GERD (gastroesophageal reflux disease)    Gout    no meds   Headache(784.0)    otc meds prn   Heart murmur    dx as a child, no problems as an adult   History of COVID-19 2024   History of hiatal hernia    Hyperlipidemia    IBS (irritable bowel syndrome)    PONV (postoperative nausea and vomiting)    Sleep apnea    uses CIPAP machine at night   Wears partial dentures    bottom partial    Objective:  BP (!) 142/74   Pulse 94   Temp 97.8 F (36.6 C)   Resp 18   Ht 5' 4 (1.626 m)   Wt 224 lb 2 oz (101.7 kg)   SpO2 94%   BMI 38.47 kg/m  Body mass index is 38.47 kg/m. Physical Exam: GEN: alert, well developed HEENT: clear conjunctiva, nose without rhinorrhea  HEART: regular rate and rhythm, no murmur LUNGS: clear to auscultation bilaterally, no coughing, unlabored respiration SKIN: no rashes or lesions  Assessment:   1. Idiopathic urticaria     Plan/Recommendations:  Idiopathic Urticaria/Angioedema (Hives/Swelling): -  Well controlled. Planning for surgery soon and generally causes him to flare ups; will hold off on adjusting Xolair  at this time.  - Continue Xolair  300mg  every 4 weeks.  Keep Epipen . Initiated 12/2022. - If Allegra  (Fexofenadine ) 360 mg twice daily and Pepcid  (Famotidine ) 40mg  twice daily as needed for hives.  - About 1 week prior to surgery, start Allegra  360mg  twice daily and Pepcid  40mg  twice daily.  - Continue Hydroxyzine  25 mg once at bedtime as needed for hives.  Otherwise do not take it.      Return in about 6 months (around 10/26/2024).  Arleta Blanch, MD [facility]

## 2024-04-25 NOTE — Patient Instructions (Addendum)
 Idiopathic Urticaria/Angioedema (Hives/Swelling): - Continue Xolair  300mg  every 4 weeks.  Keep Epipen . Initiated 12/2022. - If Allegra  (Fexofenadine ) 360 mg twice daily and Pepcid  (Famotidine ) 40mg  twice daily as needed for hives.  - About 1 week prior to surgery, start Allegra  360mg  twice daily and Pepcid  40mg  twice daily.  - Continue Hydroxyzine  25 mg once at bedtime as needed for hives.  Otherwise do not take it.

## 2024-04-26 ENCOUNTER — Telehealth: Payer: Self-pay | Admitting: Pharmacy Technician

## 2024-04-26 ENCOUNTER — Other Ambulatory Visit: Payer: Self-pay

## 2024-04-26 ENCOUNTER — Other Ambulatory Visit (HOSPITAL_COMMUNITY): Payer: Self-pay

## 2024-04-26 DIAGNOSIS — E1165 Type 2 diabetes mellitus with hyperglycemia: Secondary | ICD-10-CM

## 2024-04-26 MED ORDER — ONETOUCH ULTRA TEST VI STRP: ORAL_STRIP | 12 refills | Status: AC

## 2024-04-26 NOTE — Telephone Encounter (Signed)
 Pharmacy Patient Advocate Encounter   Received notification from Onbase that prior authorization for Accu-Chek Guide Test strips is required/requested.   Insurance verification completed.   The patient is insured through [facility] [employer] ADVANTAGE/RX ADVANCE .   Per test claim:  OneTouch test strips is preferred by the insurance.  If suggested medication is appropriate, Please send in a new RX and discontinue this one. If not, please advise as to why it's not appropriate so that we may request a Prior Authorization. Please note, some preferred medications may still require a PA.  If the suggested medications have not been trialed and there are no contraindications to their use, the PA will not be submitted, as it will not be approved.

## 2024-04-26 NOTE — Telephone Encounter (Signed)
 One touch test strips were sent to [facility].

## 2024-05-04 ENCOUNTER — Ambulatory Visit: Admitting: Orthopedic Surgery

## 2024-05-16 ENCOUNTER — Ambulatory Visit

## 2024-05-16 DIAGNOSIS — L501 Idiopathic urticaria: Secondary | ICD-10-CM | POA: Diagnosis not present

## 2024-05-19 ENCOUNTER — Other Ambulatory Visit: Payer: Self-pay

## 2024-05-19 DIAGNOSIS — F339 Major depressive disorder, recurrent, unspecified: Secondary | ICD-10-CM

## 2024-05-19 MED ORDER — SERTRALINE HCL 50 MG PO TABS
50.0000 mg | ORAL_TABLET | Freq: Every morning | ORAL | 1 refills | Status: AC
Start: 2024-05-19 — End: ?

## 2024-05-20 ENCOUNTER — Encounter: Payer: Self-pay | Admitting: Orthopedic Surgery

## 2024-05-20 ENCOUNTER — Other Ambulatory Visit: Payer: Self-pay

## 2024-05-20 ENCOUNTER — Ambulatory Visit (INDEPENDENT_AMBULATORY_CARE_PROVIDER_SITE_OTHER): Admitting: Orthopedic Surgery

## 2024-05-20 DIAGNOSIS — M79644 Pain in right finger(s): Secondary | ICD-10-CM

## 2024-05-20 DIAGNOSIS — M1811 Unilateral primary osteoarthritis of first carpometacarpal joint, right hand: Secondary | ICD-10-CM

## 2024-05-20 DIAGNOSIS — G8929 Other chronic pain: Secondary | ICD-10-CM | POA: Diagnosis not present

## 2024-05-20 MED ORDER — LIDOCAINE HCL 1 % IJ SOLN
3.0000 mL | INTRAMUSCULAR | Status: AC | PRN
  Administered 2024-05-20: 3 mL

## 2024-05-20 MED ORDER — BUPIVACAINE HCL 0.25 % IJ SOLN
0.3300 mL | INTRAMUSCULAR | Status: AC | PRN
  Administered 2024-05-20: .33 mL via INTRA_ARTICULAR

## 2024-05-20 MED ORDER — TRIAMCINOLONE ACETONIDE 40 MG/ML IJ SUSP
20.0000 mg | INTRAMUSCULAR | Status: AC | PRN
  Administered 2024-05-20: 20 mg via INTRA_ARTICULAR

## 2024-05-20 NOTE — Progress Notes (Signed)
 Office Visit Note   Patient: Christopher [name]           Date of Birth: 07/18/1956           MRN: [id number] Visit Date: 05/20/2024 Requested by: Bevely Doffing, FNP [phone] [place],  [place] [address] PCP: Bevely Doffing, FNP  Subjective: Chief Complaint  Patient presents with   Other    Right thumb pain    HPI: TYSEAN VANDERVLIET is a 68 y.o. male who presents to the office reporting significant right thumb pain at the [facility] joint.  Has MRI scan which does show mild arthritis and spurring at that joint.  He also has a history of prior cyst excision on the volar aspect of the wrist.  That cyst has recurred to some degree based on MRI scanning only.  Pain wakes him from sleep at night.  Hard for him to use that right hand.  Ice helps..                ROS: All systems reviewed are negative as they relate to the chief complaint within the history of present illness.  Patient denies fevers or chills.  Assessment & Plan: Visit Diagnoses:  1. Chronic pain of right thumb     Plan: Impression is symptomatic [facility] arthritis with positive grind test and pain really localizing to that [facility] joint.  Plan is ultrasound-guided injection into the [facility] joint today for diagnostic and therapeutic purposes.  May require surgical intervention if shots stopped to work.  For now we will see how he does with this injection.  I do not really think that small recurrent cyst is symptomatic on the volar side at this time.  Follow-Up Instructions: No follow-ups on file.   Orders:  Orders Placed This Encounter  Procedures   US  Guided Needle Placement - No Linked Charges   No orders of the defined types were placed in this encounter.     Procedures: Small Joint Inj on 05/20/2024 9:22 PM Indications: pain Details: 25 G needle, ultrasound-guided radial approach  Spinal Needle: No  Medications: 3 mL lidocaine  1 %; 0.33 mL bupivacaine  0.25 %; 20 mg triamcinolone  acetonide 40 MG/ML Outcome:  tolerated well, no immediate complications Procedure, treatment alternatives, risks and benefits explained, specific risks discussed. Consent was given by the patient. Immediately prior to procedure a time out was called to verify the correct patient, procedure, equipment, support staff and site/side marked as required. Patient was prepped and draped in the usual sterile fashion.       Clinical Data: No additional findings.  Objective: Vital Signs: There were no vitals taken for this visit.  Physical Exam:  Constitutional: Patient appears well-developed HEENT:  Head: Normocephalic Eyes:EOM are normal Neck: Normal range of motion Cardiovascular: Normal rate Pulmonary/chest: Effort normal Neurologic: Patient is alert Skin: Skin is warm Psychiatric: Patient has normal mood and affect  Ortho Exam: Ortho exam demonstrates positive grind test on the right.  EPL FPL function intact.  Does have a well-healed surgical scar on the volar aspect of the radial aspect of the wrist consistent with prior volar ganglion excision.  Radial pulse intact bilaterally.  His MCP joint is stable.  Specialty Comments:  No specialty comments available.  Imaging: No results found.   PMFS History: Patient Active Problem List   Diagnosis Date Noted   Obesity, morbid (HCC) 04/05/2024   Pseudoarthrosis of cervical spine (HCC) 09/04/2023   Type 2 diabetes mellitus with hyperglycemia (HCC)  07/08/2023   Abdominal pain 06/12/2023   Prediabetes 01/05/2023   History of anaphylaxis 11/13/2022   Idiopathic urticaria 11/03/2022   Angioedema 10/24/2022   Spinal stenosis in cervical region 10/13/2022   DOE (dyspnea on exertion) 09/26/2022   Abnormal EKG 09/18/2022   Hypogonadism in male 07/07/2022   Depression, recurrent (HCC) 06/06/2022   Chronic musculoskeletal pain 06/06/2022   Fatigue 06/06/2022   Current tobacco use 06/06/2022   S/P right knee surgery 02/18/2022   S/P TKR (total knee replacement),  right 05/14/21 07/02/2021   Status post total right knee replacement 05/14/2021   Primary osteoarthritis of right knee    Renal cyst 10/03/2020   Frequency of micturition 10/03/2020   Benign prostatic hyperplasia with urinary obstruction 10/03/2020   IBS (irritable bowel syndrome) 07/05/2020   Lumbar disc herniation 06/29/2019   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HCC)    Primary osteoarthritis, left shoulder    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   History of colonic polyps 11/26/2017   S/P total knee replacement, left 06/16/17 06/16/2017   Primary osteoarthritis of left knee    HLD (hyperlipidemia) 09/04/2016   Angina pectoris (HCC) 09/04/2016   OSA (obstructive sleep apnea) 09/04/2016   COPD (chronic obstructive pulmonary disease) (HCC) 09/04/2016   Rotator cuff tear 05/05/2014   S/P shoulder surgery 09/26/2013   Arthritis, shoulder region 09/26/2013   Bursitis, shoulder 09/26/2013   Synovitis of shoulder 09/26/2013   Labral tear of shoulder, degenerative 09/26/2013   Biceps tendon tear 09/26/2013   Rotator cuff syndrome of left shoulder 06/21/2013   Arthritis 06/21/2013   Patellofemoral arthritis of right knee 03/29/2013   Effusion of knee joint 03/29/2013   Bursitis/tendonitis, shoulder 03/10/2013   Effusion of knee joint, left 08/18/2011   Knee pain 08/18/2011   Acute torn meniscus 07/30/2011   Old torn meniscus of knee 07/30/2011   Arthropathy, lower leg 10/15/2010   MEDIAL MENISCUS TEAR, RIGHT 10/15/2010   HIP PAIN 01/15/2010   DEGENERATIVE DISC DISEASE, LUMBOSACRAL SPINE W/RADICULOPATHY 01/15/2010   PLICA SYNDROME 08/09/2009   DERANGEMENT MENISCUS 07/09/2009   JOINT EFFUSION, LEFT KNEE 07/09/2009   Unilateral primary osteoarthritis, left knee 01/30/2009   KNEE PAIN 01/30/2009   Sprain of ankle 11/08/2007   Past Medical History:  Diagnosis Date   Anxiety    Arthritis    Asthma    BPH (benign prostatic hyperplasia)    Complication of anesthesia    pt had a  hard time being able to move after spinal anesthesia , 3-4 hours   COPD (chronic obstructive pulmonary disease) (HCC)    Depression    Diabetes mellitus without complication (HCC)    GERD (gastroesophageal reflux disease)    Gout    no meds   Headache(784.0)    otc meds prn   Heart murmur    dx as a child, no problems as an adult   History of COVID-19 2024   History of hiatal hernia    Hyperlipidemia    IBS (irritable bowel syndrome)    PONV (postoperative nausea and vomiting)    Sleep apnea    uses CIPAP machine at night   Wears partial dentures    bottom partial    Family History  Problem Relation Age of Onset   Diabetes Mother    Alzheimer's disease Father    Diabetes Father    Diabetes Other    Lung disease Other    Arthritis Other    Anesthesia problems Neg Hx    Hypotension Neg Hx  Malignant hyperthermia Neg Hx    Pseudochol deficiency Neg Hx    Sleep apnea Neg Hx     Past Surgical History:  Procedure Laterality Date   ANTERIOR CERVICAL DECOMP/DISCECTOMY FUSION N/A 10/13/2022   Procedure: ANTERIOR CERVICAL DISECTOMY FUSION - CERVICAL THREE-CERVICAL FOUR - CERVICAL FOUR-CERVICAL FIVE REMOVAL OF HARDWARE CERVICAL FIVE-CERVICAL SIX;  Surgeon: [name], MD;  Location: [facility] OR;  Service: Neurosurgery;  Laterality: N/A;   APPENDECTOMY     BACK SURGERY  2002   neck and back fusion   BIOPSY  12/27/2015   Procedure: BIOPSY;  Surgeon: Claudis RAYMOND Rivet, MD;  Location: [facility];  Service: Endoscopy;;  Fundus biopsies and duodenal biopsies   BIOPSY  02/10/2020   Procedure: BIOPSY;  Surgeon: Rivet Claudis RAYMOND, MD;  [facility]: [facility];  Service: Endoscopy;;  antral   CARDIAC CATHETERIZATION     CARDIAC CATHETERIZATION N/A 09/04/2016   Procedure: Right/Left Heart Cath and Coronary Angiography;  Surgeon: Peter M Swaziland, MD;  Location: [facility] INVASIVE CV LAB;  Service: Cardiovascular;  Laterality: N/A;   CHOLECYSTECTOMY     CHONDROPLASTY  08/15/2011   Procedure:  CHONDROPLASTY;  Surgeon: Taft Minerva, MD;  Location: [facility] ORS;  Service: Orthopedics;  Laterality: Left;   COLONOSCOPY  06/27/2011   Procedure: COLONOSCOPY;  Surgeon: Claudis RAYMOND Rivet, MD;  [facility];  Service: Endoscopy;  Laterality: N/A;  9:00 / Pt to be here at 9am for 10:45 procedure, benign polyps removed   COLONOSCOPY N/A 10/05/2014   Procedure: COLONOSCOPY;  Surgeon: Claudis RAYMOND Rivet, MD;  [facility];  Service: Endoscopy;  Laterality: N/A;  930   COLONOSCOPY N/A 02/11/2018   Procedure: COLONOSCOPY;  Surgeon: Rivet Claudis RAYMOND, MD;  [facility];  Service: Endoscopy;  Laterality: N/A;  830   COLONOSCOPY N/A 12/02/2023   Procedure: COLONOSCOPY;  Surgeon: Cindie Carlin POUR, DO;  [facility];  Service: Endoscopy;  Laterality: N/A;  845am, asa 3   ESOPHAGOGASTRODUODENOSCOPY N/A 12/27/2015   Procedure: ESOPHAGOGASTRODUODENOSCOPY (EGD);  Surgeon: Claudis RAYMOND Rivet, MD;  [facility];  Service: Endoscopy;  Laterality: N/A;  3:00   ESOPHAGOGASTRODUODENOSCOPY (EGD) WITH PROPOFOL  N/A 02/10/2020   Procedure: ESOPHAGOGASTRODUODENOSCOPY (EGD) WITH PROPOFOL ;  Surgeon: Rivet Claudis RAYMOND, MD;  [facility];  Service: Endoscopy;  Laterality: N/A;  155   HERNIA REPAIR  1998   umbilical hernia   JOINT REPLACEMENT  2023   knee and shoulder-bilateral knees and left shoulder   KNEE ARTHROSCOPY     left knee   KNEE ARTHROSCOPY     right knee    LUMBAR LAMINECTOMY/DECOMPRESSION MICRODISCECTOMY  09/14/2012   Procedure: LUMBAR LAMINECTOMY/DECOMPRESSION MICRODISCECTOMY 1 LEVEL;  Surgeon: Catalina CHRISTELLA Stains, MD;  Location: [facility]O [facility];  Service: Neurosurgery;  Laterality: Right;  Right Lumbar three-four Diskectomy   neck fusion  2005   NECK SURGERY     PATELLA-FEMORAL ARTHROPLASTY Right 02/18/2022   Procedure: RIGHT KNEE PATELLA REPLACEMENT, CEMENTED;  Surgeon: Addie Cordella Hamilton, MD;  Location: [facility] OR;  Service: Orthopedics;  Laterality: Right;    POLYPECTOMY  02/11/2018   Procedure: POLYPECTOMY;  Surgeon: Rivet Claudis RAYMOND, MD;  [facility];  Service: Endoscopy;;  colon   POLYPECTOMY  12/02/2023   Procedure: POLYPECTOMY;  Surgeon: Cindie Carlin POUR, DO;  [facility];  Service: Endoscopy;;   POSTERIOR CERVICAL FUSION/FORAMINOTOMY N/A 09/04/2023   Procedure: Posterior Cervical Fusion, Cervical Five-Cervical Four and Cervical Three-Cervical Three-Cervical Four;  Surgeon:  [name], MD;  Location: [facility] OR;  Service: Neurosurgery;  Laterality: N/A;   REVISION TOTAL SHOULDER TO REVERSE TOTAL SHOULDER Left 12/16/2018   Procedure: REVISION TOTAL SHOULDER TO REVERSE TOTAL SHOULDER;  Surgeon: Addie Cordella Hamilton, MD;  Location: [facility] OR;  Service: Orthopedics;  Laterality: Left;   SHOULDER ARTHROSCOPY WITH BICEPSTENOTOMY Left 09/23/2013   Procedure: SHOULDER ARTHROSCOPY WITH BICEPSTENOTOMY AND EXTENSIVE DEBRIDEMENT;  Surgeon: Taft FORBES Minerva, MD;  Location: [facility];  Service: Orthopedics;  Laterality: Left;   SHOULDER ARTHROSCOPY WITH ROTATOR CUFF REPAIR Left 05/05/2014   Procedure: SHOULDER ARTHROSCOPY LIMITED DEBRIDEMENT;  Surgeon: Taft FORBES Minerva, MD;  Location: [facility];  Service: Orthopedics;  Laterality: Left;   SHOULDER OPEN ROTATOR CUFF REPAIR Left 05/05/2014   Procedure: ROTATOR CUFF REPAIR SHOULDER OPEN;  Surgeon: Taft FORBES Minerva, MD;  Location: [facility];  Service: Orthopedics;  Laterality: Left;   SHOULDER OPEN ROTATOR CUFF REPAIR Left 12/16/2018   Procedure: LEFT SHOULDER POSSIBLE SUBSCAPULARIS REPAIR VS. REVISION TO REVERSE TOTAL SHOULDER REPLACEMENT;  Surgeon: Addie Cordella Hamilton, MD;  Location: [facility] OR;  Service: Orthopedics;  Laterality: Left;   SHOULDER SURGERY Right    Open Mumford procedure   SPINAL FUSION     x2, 2003 and 2007   TOTAL KNEE ARTHROPLASTY Left 06/16/2017   Procedure: LEFT TOTAL KNEE ARTHROPLASTY;  Surgeon: Minerva Taft FORBES, MD;  Location: [facility];  Service: Orthopedics;  Laterality: Left;    TOTAL KNEE ARTHROPLASTY Right 05/14/2021   Procedure: TOTAL KNEE ARTHROPLASTY;  Surgeon: Minerva Taft FORBES, MD;  Location: [facility];  Service: Orthopedics;  Laterality: Right;   TOTAL SHOULDER ARTHROPLASTY Left 08/19/2018   Procedure: left shoulder replacement;  Surgeon: Addie Cordella Hamilton, MD;  Location: [facility] OR;  Service: Orthopedics;  Laterality: Left;   Social History   Occupational History   Occupation: Event organiser: DISABLED  Tobacco Use   Smoking status: Every Day    Current packs/day: 1.00    Average packs/day: 1 pack/day for 45.7 years (45.7 ttl pk-yrs)    Types: Cigarettes    Start date: 08/25/1978    Passive exposure: Current   Smokeless tobacco: Never   Tobacco comments:    smokes a pack a day. since age 53  Vaping Use   Vaping status: Never Used  Substance and Sexual Activity   Alcohol use: Never   Drug use: Never   Sexual activity: Yes

## 2024-05-30 ENCOUNTER — Other Ambulatory Visit

## 2024-06-01 ENCOUNTER — Ambulatory Visit: Payer: Self-pay

## 2024-06-01 NOTE — Telephone Encounter (Signed)
 FYI Only or Action Required?: Action required by provider: unsure if going to ER.  Patient was last seen in primary care on 04/05/2024 by Bevely Doffing, FNP.  Called Nurse Triage reporting Abdominal Pain and black stool  Symptoms began yesterday.  Interventions attempted: Nothing.  Symptoms are: gradually worsening.  Triage Disposition: Go to ED Now (Notify PCP)  Patient/caregiver understands and will follow disposition?: Unsure   Copied from CRM [id number]. Topic: Clinical - Red Word Triage >> Jun 01, 2024  9:09 AM Emylou G wrote: [facility] that prompted transfer to Nurse Triage: bowels are turning black, he doesn't know if it is the hernia.. bad stomach pain and extremely tired.. Reason for Disposition  Black or tarry bowel movements  (Exception: Chronic-unchanged black-grey BMs AND is taking iron pills or Pepto-Bismol.)  Answer Assessment - Initial Assessment Questions Additional info:  Wife calling she is not with patient at this time, requesting OV for abdominal pain and black stool. Advised ER, spouse states she will let him know but he most likely will not go due to excessive wait times.     1. LOCATION: Where does it hurt?      Abdomen 2. RADIATION: Does the pain shoot anywhere else? (e.g., chest, back)     Not answered  3. ONSET: When did the pain begin? (Minutes, hours or days ago)      Few days 4. SUDDEN: Gradual or sudden onset?     Sudden  5. PATTERN Does the pain come and go, or is it constant?     Constant  6. SEVERITY: How bad is the pain?  (e.g., Scale 1-10; mild, moderate, or severe)      7. RECURRENT SYMPTOM: Have you ever had this type of stomach pain before? If Yes, ask: When was the last time? and What happened that time?      no 8. CAUSE: What do you think is causing the stomach pain? (e.g., gallstones, recent abdominal surgery)     Unsure-history of hernia repair 9. RELIEVING/AGGRAVATING FACTORS: What makes it better or worse?  (e.g., antacids, bending or twisting motion, bowel movement)       10. OTHER SYMPTOMS: Do you have any other symptoms? (e.g., back pain, diarrhea, fever, urination pain, vomiting)       Diaphoreses intermittently, jet black stool.  Protocols used: Abdominal Pain - Male-A-AH

## 2024-06-02 LAB — COMPREHENSIVE METABOLIC PANEL WITH GFR
ALT: 23 IU/L (ref 0–44)
AST: 14 IU/L (ref 0–40)
Albumin: 4.3 g/dL (ref 3.9–4.9)
Alkaline Phosphatase: 100 IU/L (ref 47–123)
BUN/Creatinine Ratio: 21 (ref 10–24)
BUN: 21 mg/dL (ref 8–27)
Bilirubin Total: 0.3 mg/dL (ref 0.0–1.2)
CO2: 21 mmol/L (ref 20–29)
Calcium: 9.6 mg/dL (ref 8.6–10.2)
Chloride: 98 mmol/L (ref 96–106)
Creatinine, Ser: 1 mg/dL (ref 0.76–1.27)
Globulin, Total: 2.1 g/dL (ref 1.5–4.5)
Glucose: 201 mg/dL — ABNORMAL HIGH (ref 70–99)
Potassium: 4.7 mmol/L (ref 3.5–5.2)
Sodium: 135 mmol/L (ref 134–144)
Total Protein: 6.4 g/dL (ref 6.0–8.5)
eGFR: 83 mL/min/1.73 (ref >59)

## 2024-06-02 LAB — CBC
Hematocrit: 45.3 % (ref 37.5–51.0)
Hemoglobin: 14.9 g/dL (ref 13.0–17.7)
MCH: 28.2 pg (ref 26.6–33.0)
MCHC: 32.9 g/dL (ref 31.5–35.7)
MCV: 86 fL (ref 79–97)
Platelets: 239 x10E3/uL (ref 150–450)
RBC: 5.29 x10E6/uL (ref 4.14–5.80)
RDW: 14.2 % (ref 11.6–15.4)
WBC: 11 x10E3/uL — ABNORMAL HIGH (ref 3.4–10.8)

## 2024-06-02 LAB — TESTOSTERONE,FREE AND TOTAL
Testosterone, Free: 33.3 pg/mL — ABNORMAL HIGH (ref 6.6–18.1)
Testosterone: 521 ng/dL (ref 264–916)

## 2024-06-02 NOTE — Telephone Encounter (Signed)
 Tried calling number in chart but phone number is disconnected

## 2024-06-07 ENCOUNTER — Other Ambulatory Visit (HOSPITAL_COMMUNITY)
Admission: RE | Admit: 2024-06-07 | Discharge: 2024-06-07 | Disposition: A | Discharge status: Home / Self Care | Source: Ambulatory Visit | Attending: Internal Medicine | Admitting: Internal Medicine

## 2024-06-07 ENCOUNTER — Ambulatory Visit: Attending: Internal Medicine | Admitting: Internal Medicine

## 2024-06-07 ENCOUNTER — Encounter: Payer: Self-pay | Admitting: Internal Medicine

## 2024-06-07 VITALS — BP 158/90 | HR 64 | Ht 66.0 in | Wt 222.0 lb

## 2024-06-07 DIAGNOSIS — H538 Other visual disturbances: Secondary | ICD-10-CM | POA: Diagnosis not present

## 2024-06-07 DIAGNOSIS — I10 Essential (primary) hypertension: Secondary | ICD-10-CM | POA: Diagnosis not present

## 2024-06-07 DIAGNOSIS — I209 Angina pectoris, unspecified: Secondary | ICD-10-CM | POA: Diagnosis present

## 2024-06-07 DIAGNOSIS — R079 Chest pain, unspecified: Secondary | ICD-10-CM

## 2024-06-07 LAB — BASIC METABOLIC PANEL WITH GFR
Anion gap: 12 (ref 5–15)
BUN: 19 mg/dL (ref 8–23)
CO2: 25 mmol/L (ref 22–32)
Calcium: 9.7 mg/dL (ref 8.9–10.3)
Chloride: 104 mmol/L (ref 98–111)
Creatinine, Ser: 1.1 mg/dL (ref 0.61–1.24)
GFR, Estimated: >60 mL/min (ref >60)
Glucose, Bld: 97 mg/dL (ref 70–99)
Potassium: 4.7 mmol/L (ref 3.5–5.1)
Sodium: 141 mmol/L (ref 135–145)

## 2024-06-07 LAB — CBC
HCT: 43.7 % (ref 39.0–52.0)
Hemoglobin: 14.8 g/dL (ref 13.0–17.0)
MCH: 28.6 pg (ref 26.0–34.0)
MCHC: 33.9 g/dL (ref 30.0–36.0)
MCV: 84.4 fL (ref 80.0–100.0)
Platelets: 234 K/uL (ref 150–400)
RBC: 5.18 MIL/uL (ref 4.22–5.81)
RDW: 14.9 % (ref 11.5–15.5)
WBC: 9.4 K/uL (ref 4.0–10.5)
nRBC: 0 % (ref 0.0–0.2)

## 2024-06-07 MED ORDER — AMLODIPINE BESYLATE 5 MG PO TABS: 5.0000 mg | ORAL_TABLET | Freq: Every day | ORAL | 3 refills | Status: AC

## 2024-06-07 MED ORDER — METOPROLOL TARTRATE 100 MG PO TABS: ORAL_TABLET | ORAL | 0 refills | Status: DC

## 2024-06-07 NOTE — Patient Instructions (Addendum)
 Medication Instructions:  Your physician has recommended you make the following change in your medication:   -Start Amlodipine 5 mg once daily  -Stop Ozempic    *If you need a refill on your cardiac medications before your next appointment, please call your pharmacy*  Lab Work: CBC  If you have labs (blood work) drawn today and your tests are completely normal, you will receive your results only by: MyChart Message (if you have MyChart) OR A paper copy in the mail If you have any lab test that is abnormal or we need to change your treatment, we will call you to review the results.  Testing/Procedures: Cardiac CTA   Follow-Up: At [facility], you and your health needs are our priority.  As part of our continuing mission to provide you with exceptional heart care, our providers are all part of one team.  This team includes your primary Cardiologist (physician) and Advanced Practice Providers or APPs (Physician Assistants and Nurse Practitioners) who all work together to provide you with the care you need, when you need it.  Your next appointment:   3 month(s)  Provider:   You may see Vishnu P Mallipeddi, MD or one of the following Advanced Practice Providers on your designated Care Team:   [place], PA-C  [place], [place] Olivia [place], [place]      We recommend signing up for the patient portal called MyChart.  Sign up information is provided on this After Visit Summary.  MyChart is used to connect with patients for Virtual Visits (Telemedicine).  Patients are able to view lab/test results, encounter notes, upcoming appointments, etc.  Non-urgent messages can be sent to your provider as well.   To learn more about what you can do with MyChart, go to [email or web address].   Other Instructions   Your cardiac CT will be scheduled at one of the below locations:  [facility] [address]  [place], [place] [address] [phone]  If scheduled at the Heart and Vascular Tower at [facility] street, please enter the parking lot using the [address] entrance and use the FREE valet service at the patient drop-off area. Enter the building and check-in with registration on the main floor.   Please follow these instructions carefully (unless otherwise directed):  An IV will be required for this test and Nitroglycerin  will be given.  Hold all erectile dysfunction medications at least 3 days (72 hrs) prior to test. (Ie viagra, cialis, sildenafil, tadalafil, etc)   On the Night Before the Test: Be sure to Drink plenty of water . Do not consume any caffeinated/decaffeinated beverages or chocolate 12 hours prior to your test. Do not take any antihistamines 12 hours prior to your test.    On the Day of the Test: Drink plenty of water  until 1 hour prior to the test. Do not eat any food 1 hour prior to test. You may take your regular medications prior to the test.  Take metoprolol (Lopressor) two hours prior to test. If you take Furosemide /Hydrochlorothiazide/Spironolactone/Chlorthalidone, please HOLD on the morning of the test. Patients who wear a continuous glucose monitor MUST remove the device prior to scanning.       After the Test: Drink plenty of water . After receiving IV contrast, you may experience a mild flushed feeling. This is normal. On occasion, you may experience a mild rash up to 24 hours after the test. This is not dangerous. If this occurs, you can take Benadryl  25 mg,  Zyrtec, Claritin , or Allegra  and increase your fluid intake. (Patients taking Tikosyn should avoid Benadryl , and may take Zyrtec, Claritin , or Allegra ) If you experience trouble breathing, this can be serious. If it is severe call 911 IMMEDIATELY. If it is mild, please call our office.  We will call to schedule your test 2-4 weeks out understanding that some insurance companies will need an authorization prior to the service being  performed.   For more information and frequently asked questions, please visit our website : [email or web address]  For non-scheduling related questions, please contact the cardiac imaging nurse navigator should you have any questions/concerns: Cardiac Imaging Nurse Navigators Direct Office Dial: [phone]   For scheduling needs, including cancellations and rescheduling, please call [place], [phone].

## 2024-06-07 NOTE — Progress Notes (Signed)
 Cardiology Office Note  Date: 06/07/2024   ID: Christopher [name], DOB 06-12-1956, MRN [id number]  PCP:  Bevely Doffing, FNP  Cardiologist:  Diannah SHAUNNA Maywood, MD Electrophysiologist:  None   Reason for Office Visit: Chest pain   History of Present Illness: Christopher [name] is a 68 y.o. male known to have OSA on CPAP, HLD, prediabetes, COPD, former smoker is here for follow-up visit of chest pain.  Patient was initially referred to cardiology clinic in 2018 for incidental finding of coronary calcifications on CT scan. Due to his symptoms of chest pain and SOB, he underwent right and left heart cath. LHC/RHC showed diffuse mild nonobstructive disease, normal right heart and LV filling pressures and normal cardiac output.  He was referred again for preop cardiac risk stratification for neck surgery in 2024.  At that time he had 1 episode of chest heaviness associated with SOB.  He is here today for follow-up visit.  He reported he had 1 episode of chest heaviness when he was doing yard work that lasted at least for 3 to 4 minutes, if not more.  This resolved with SL NTG.  He also gets sharp chest pains with exertion sometimes at rest.  This chest pain is resolved with SL NTG.  Last for couple of minutes.  He also gets pains in his abdomen after he eats.  He gets severe cramps in his abdomen.  Then these cramps radiate to his chest.  His blood pressures are very elevated at home.  189 mmHg SBP.  Usually ranges around 160 to 180 mmHg SBP.  Not on any blood pressure medications at home.  He is currently a diabetic.  On insulin  and Ozempic .  He noticed blurry vision recently.  He smokes cigarettes currently, 1 pack of cigarettes every 2 days.  He was diagnosed with COPD in the past but is currently not on any inhalers.  Past Medical History:  Diagnosis Date   Anxiety    Arthritis    Asthma    BPH (benign prostatic hyperplasia)    Complication of anesthesia    pt had a hard time being able to  move after spinal anesthesia , 3-4 hours   COPD (chronic obstructive pulmonary disease) (HCC)    Depression    Diabetes mellitus without complication (HCC)    GERD (gastroesophageal reflux disease)    Gout    no meds   Headache(784.0)    otc meds prn   Heart murmur    dx as a child, no problems as an adult   History of COVID-19 2024   History of hiatal hernia    Hyperlipidemia    IBS (irritable bowel syndrome)    PONV (postoperative nausea and vomiting)    Sleep apnea    uses CIPAP machine at night   Wears partial dentures    bottom partial    Past Surgical History:  Procedure Laterality Date   ANTERIOR CERVICAL DECOMP/DISCECTOMY FUSION N/A 10/13/2022   Procedure: ANTERIOR CERVICAL DISECTOMY FUSION - CERVICAL THREE-CERVICAL FOUR - CERVICAL FOUR-CERVICAL FIVE REMOVAL OF HARDWARE CERVICAL FIVE-CERVICAL SIX;  Surgeon: [name], MD;  [facility]: [facility];  Service: Neurosurgery;  Laterality: N/A;   APPENDECTOMY     BACK SURGERY  2002   neck and back fusion   BIOPSY  12/27/2015   Procedure: BIOPSY;  Surgeon: Claudis RAYMOND Rivet, MD;  [facility]: [facility];  Service: Endoscopy;;  Fundus biopsies and duodenal biopsies   BIOPSY  02/10/2020  Procedure: BIOPSY;  Surgeon: Golda Claudis PENNER, MD;  [facility];  Service: Endoscopy;;  antral   CARDIAC CATHETERIZATION     CARDIAC CATHETERIZATION N/A 09/04/2016   Procedure: Right/Left Heart Cath and Coronary Angiography;  Surgeon: Peter M Swaziland, MD;  Location: [facility] INVASIVE CV LAB;  Service: Cardiovascular;  Laterality: N/A;   CHOLECYSTECTOMY     CHONDROPLASTY  08/15/2011   Procedure: CHONDROPLASTY;  Surgeon: Taft Minerva, MD;  Location: [facility];  Service: Orthopedics;  Laterality: Left;   COLONOSCOPY  06/27/2011   Procedure: COLONOSCOPY;  Surgeon: Claudis PENNER Golda, MD;  [facility];  Service: Endoscopy;  Laterality: N/A;  9:00 / Pt to be here at 9am for 10:45 procedure, benign polyps removed   COLONOSCOPY N/A 10/05/2014    Procedure: COLONOSCOPY;  Surgeon: Claudis PENNER Golda, MD;  [facility];  Service: Endoscopy;  Laterality: N/A;  930   COLONOSCOPY N/A 02/11/2018   Procedure: COLONOSCOPY;  Surgeon: Golda Claudis PENNER, MD;  [facility];  Service: Endoscopy;  Laterality: N/A;  830   COLONOSCOPY N/A 12/02/2023   Procedure: COLONOSCOPY;  Surgeon: Cindie Carlin POUR, DO;  [facility];  Service: Endoscopy;  Laterality: N/A;  845am, asa 3   ESOPHAGOGASTRODUODENOSCOPY N/A 12/27/2015   Procedure: ESOPHAGOGASTRODUODENOSCOPY (EGD);  Surgeon: Claudis PENNER Golda, MD;  [facility];  Service: Endoscopy;  Laterality: N/A;  3:00   ESOPHAGOGASTRODUODENOSCOPY (EGD) WITH PROPOFOL  N/A 02/10/2020   Procedure: ESOPHAGOGASTRODUODENOSCOPY (EGD) WITH PROPOFOL ;  Surgeon: Golda Claudis PENNER, MD;  [facility];  Service: Endoscopy;  Laterality: N/A;  155   HERNIA REPAIR  1998   umbilical hernia   JOINT REPLACEMENT  2023   knee and shoulder-bilateral knees and left shoulder   KNEE ARTHROSCOPY     left knee   KNEE ARTHROSCOPY     right knee    LUMBAR LAMINECTOMY/DECOMPRESSION MICRODISCECTOMY  09/14/2012   Procedure: LUMBAR LAMINECTOMY/DECOMPRESSION MICRODISCECTOMY 1 LEVEL;  Surgeon: Catalina CHRISTELLA Stains, MD;  [facility]: [facility];  Service: Neurosurgery;  Laterality: Right;  Right Lumbar three-four Diskectomy   neck fusion  2005   NECK SURGERY     PATELLA-FEMORAL ARTHROPLASTY Right 02/18/2022   Procedure: RIGHT KNEE PATELLA REPLACEMENT, CEMENTED;  Surgeon: Addie Cordella Hamilton, MD;  Location: [facility];  Service: Orthopedics;  Laterality: Right;   POLYPECTOMY  02/11/2018   Procedure: POLYPECTOMY;  Surgeon: Golda Claudis PENNER, MD;  [facility];  Service: Endoscopy;;  colon   POLYPECTOMY  12/02/2023   Procedure: POLYPECTOMY;  Surgeon: Cindie Carlin POUR, DO;  [facility];  Service: Endoscopy;;   POSTERIOR CERVICAL FUSION/FORAMINOTOMY N/A 09/04/2023   Procedure: Posterior  Cervical Fusion, Cervical Five-Cervical Four and Cervical Three-Cervical Three-Cervical Four;  Surgeon: [name], MD;  Location: [facility] OR;  Service: Neurosurgery;  Laterality: N/A;   REVISION TOTAL SHOULDER TO REVERSE TOTAL SHOULDER Left 12/16/2018   Procedure: REVISION TOTAL SHOULDER TO REVERSE TOTAL SHOULDER;  Surgeon: Addie Cordella Hamilton, MD;  Location[facility] OR;  Service: Orthopedics;  Laterality: Left;   SHOULDER ARTHROSCOPY WITH BICEPSTENOTOMY Left 09/23/2013   Procedure: SHOULDER ARTHROSCOPY WITH BICEPSTENOTOMY AND EXTENSIVE DEBRIDEMENT;  Surgeon: Taft FORBES Minerva, MD;  [facility]: [facility];  Service: Orthopedics;  Laterality: Left;   SHOULDER ARTHROSCOPY WITH ROTATOR CUFF REPAIR Left 05/05/2014   Procedure: SHOULDER ARTHROSCOPY LIMITED DEBRIDEMENT;  Surgeon: Taft FORBES Minerva, MD;  [facility]: [facility];  Service: Orthopedics;  Laterality: Left;   SHOULDER OPEN ROTATOR CUFF REPAIR Left 05/05/2014  Procedure: ROTATOR CUFF REPAIR SHOULDER OPEN;  Surgeon: Taft FORBES Minerva, MD;  [facility]: [facility];  Service: Orthopedics;  Laterality: Left;   SHOULDER OPEN ROTATOR CUFF REPAIR Left 12/16/2018   Procedure: LEFT SHOULDER POSSIBLE SUBSCAPULARIS REPAIR VS. REVISION TO REVERSE TOTAL SHOULDER REPLACEMENT;  Surgeon: Addie Cordella Hamilton, MD;  Location: [facility] OR;  Service: Orthopedics;  Laterality: Left;   SHOULDER SURGERY Right    Open Mumford procedure   SPINAL FUSION     x2, 2003 and 2007   TOTAL KNEE ARTHROPLASTY Left 06/16/2017   Procedure: LEFT TOTAL KNEE ARTHROPLASTY;  Surgeon: Minerva Taft FORBES, MD;  Location: [facility];  Service: Orthopedics;  Laterality: Left;   TOTAL KNEE ARTHROPLASTY Right 05/14/2021   Procedure: TOTAL KNEE ARTHROPLASTY;  Surgeon: Minerva Taft FORBES, MD;  Location: [facility];  Service: Orthopedics;  Laterality: Right;   TOTAL SHOULDER ARTHROPLASTY Left 08/19/2018   Procedure: left shoulder replacement;  Surgeon: Addie Cordella Hamilton, MD;  Location: [facility] OR;  Service: Orthopedics;  Laterality:  Left;    Current Outpatient Medications  Medication Sig Dispense Refill   albuterol  (VENTOLIN  HFA) 108 (90 Base) MCG/ACT inhaler inhale 2 puffs into lungs every 6 hours as needed for wheezing or shortness of breath 6.7 g 0   Blood Glucose Monitoring Suppl (ONE TOUCH ULTRA 2) w/Device KIT USE TO TEST BLOOD SUGAR THREE TIMES DAILY. 1 kit 0   Blood Glucose Monitoring Suppl DEVI 1 each by Does not apply route in the morning, at noon, and at bedtime. May substitute to any manufacturer covered by patient's insurance. 1 each 0   cyclobenzaprine  (FLEXERIL ) 10 MG tablet Take 1 tablet (10 mg total) by mouth 3 (three) times daily as needed for muscle spasms. 30 tablet 3   EPINEPHrine  0.3 mg/0.3 mL IJ SOAJ injection Inject 0.3 mg into the muscle as needed for anaphylaxis. 2 each 1   famotidine  (PEPCID ) 40 MG tablet Take 1 tablet (40 mg total) by mouth 2 (two) times daily as needed (hives). 60 tablet 5   fexofenadine  (ALLEGRA  ALLERGY ) 180 MG tablet Take 2 tablets (360 mg total) by mouth 2 (two) times daily as needed (hives). 60 tablet 5   gabapentin  (NEURONTIN ) 300 MG capsule Take 300 mg by mouth 3 (three) times daily.     GLOBAL EASE INJECT PEN NEEDLES 31G X 5 MM MISC Inject 1 Syringe into the skin daily. 100 each 0   glucose blood (ONETOUCH ULTRA TEST) test strip Use daily to check blood sugars. 100 each 12   hydrOXYzine  (VISTARIL ) 25 MG capsule TAKE 1-2 CAPSULES BY MOUTH AT BEDTIME AS NEEDED. 30 capsule 5   Lancets (ONETOUCH DELICA PLUS LANCET33G) MISC 1 each by Does not apply route in the morning, at noon, and at bedtime. 100 each 0   LANTUS  SOLOSTAR 100 UNIT/ML Solostar Pen Inject 20 Units into the skin at bedtime.     meloxicam  (MOBIC ) 7.5 MG tablet Take 7.5 mg by mouth 2 (two) times daily.     Menthol , Topical Analgesic, (FREEZE IT FAST PAIN RELIEF EX) Apply 1 Application topically daily. 10%     montelukast  (SINGULAIR ) 10 MG tablet Take 1 tablet (10 mg total) by mouth at bedtime. 30 tablet 5    nitroGLYCERIN  (NITROSTAT ) 0.4 MG SL tablet PLACE (1) TABLET UNDER TONGUE EVERY 5 MINUTES UP TO (3) DOSES. IF NO RELIEF CALL 911. 25 tablet 3   omalizumab  (XOLAIR ) 300 MG/2  ML prefilled syringe Inject 300 mg into the skin every 28 (twenty-eight) days. 2 mL  11   ondansetron  (ZOFRAN ) 4 MG tablet Take 1 tablet (4 mg total) by mouth every 8 (eight) hours as needed for nausea. 20 tablet 0   pantoprazole  (PROTONIX ) 40 MG tablet TAKE (1) TABLET TWICE A DAY BEFORE MEALS. (Patient taking differently: Take 40 mg by mouth daily.) 60 tablet 0   rosuvastatin  (CRESTOR ) 40 MG tablet Take 1 tablet (40 mg total) by mouth daily. 90 tablet 3   Semaglutide , 1 MG/DOSE, 4 MG/3ML SOPN Inject 1 mg as directed once a week. 3 mL 2   sertraline  (ZOLOFT ) 50 MG tablet Take 1 tablet (50 mg total) by mouth in the morning. 90 tablet 1   Testosterone  Undecanoate (JATENZO ) 237 MG CAPS Take 1 capsule (237 mg total) by mouth in the morning and at bedtime. 60 capsule 5   Tiotropium Bromide-Olodaterol (STIOLTO RESPIMAT ) 2.5-2.5 MCG/ACT AERS Inhale 2 puffs into the lungs daily. 1 each 5   Current Facility-Administered Medications  Medication Dose Route Frequency Provider Last Rate Last Admin   omalizumab  (XOLAIR ) prefilled syringe 300 mg  300 mg Subcutaneous Q28 days Gallagher, Joel Louis, MD   300 mg at 05/16/24 1331   Allergies:  Celebrex [celecoxib], Codeine, Cortisone, Doxycycline, Medrol  [methylprednisolone ], Prednisone , Relafen [nabumetone], Aspirin , Oxycodone -acetaminophen , Raloxifene, and Rofecoxib   Social History: The patient  reports that he has been smoking cigarettes. He started smoking about 45 years ago. He has a 45.8 pack-year smoking history. He has been exposed to tobacco smoke. He has never used smokeless tobacco. He reports that he does not drink alcohol and does not use drugs.   Family History: The patient's family history includes Alzheimer's disease in his father; Arthritis in an other family member; Diabetes in  his father, mother, and another family member; Lung disease in an other family member.   ROS:  Please see the history of present illness. Otherwise, complete review of systems is positive for none.  All other systems are reviewed and negative.   Physical Exam: VS:  BP (!) 158/90   Pulse 64   Ht 5' 6 (1.676 m)   Wt 222 lb (100.7 kg)   SpO2 96%   BMI 35.83 kg/m , BMI Body mass index is 35.83 kg/m.  Wt Readings from Last 3 Encounters:  06/07/24 222 lb (100.7 kg)  04/25/24 224 lb 2 oz (101.7 kg)  04/05/24 223 lb 1.3 oz (101.2 kg)    General: Patient appears comfortable at rest. HEENT: Conjunctiva and lids normal, oropharynx clear with moist mucosa. Neck: Supple, no elevated JVP or carotid bruits, no thyromegaly. Lungs: Clear to auscultation, nonlabored breathing at rest. Cardiac: Regular rate and rhythm, no S3 or significant systolic murmur, no pericardial rub. Abdomen: Soft, nontender, no hepatomegaly, bowel sounds present, no guarding or rebound. Extremities: No pitting edema, distal pulses 2+. Skin: Warm and dry. Musculoskeletal: No kyphosis. Neuropsychiatric: Alert and oriented x3, affect grossly appropriate.  ECG:  An ECG dated 09/26/2022 was personally reviewed today and demonstrated:  Sinus bradycardia, incomplete right bundle branch block  Recent Labwork: 05/30/2024: ALT 23; AST 14; BUN 21; Creatinine, Ser 1.00; Hemoglobin 14.9; Platelets 239; Potassium 4.7; Sodium 135     Component Value Date/Time   CHOL 124 04/05/2024 1523   TRIG 126 04/05/2024 1523   HDL 37 (L) 04/05/2024 1523   CHOLHDL 3.4 04/05/2024 1523   LDLCALC 64 04/05/2024 1523    Assessment and Plan:  Chest pain - Mixed nature, has both cardiac and noncardiac components.  He gets cramps in his abdomen right after  eating and these pains that radiate to his chest.  He also gets chest pains/heaviness with exertion.  This will last for about couple of minutes.  Resolved with SL NTG.  Will refill SL NTG. Prior  LHC from 2018 showed mild nonobstructive CAD. He has risk factors of poorly controlled HTN, newly diagnosed insulin -dependent diabetes mellitus type 2, chronic smoking history.  He will benefit from ischemia evaluation with CTA cardiac.  No allergy  to contrast.  HTN, poorly controlled - Currently not on any antihypertensive medications.  Blood pressures at home range around 160 to 180 mmHg SBP.  Start amlodipine 5 mg once daily.  Check blood pressures at home daily, in a.m. and p.m.  Blurry vision - Event started after taking Ozempic .  Encouraged him to stop and see ophthalmologist.  If cleared, he probably can resume Ozempic  under the care of his PCP.  OSA on CPAP - Continue CPAP.  Nicotine  abuse - Smokes 1 pack/ 2 days.  Counseling provided.  I have spent a total of 30 minutes with patient reviewing chart, EKGs, labs and examining patient as well as establishing an assessment and plan that was discussed with the patient.  > 50% of time was spent in direct patient care.      Medication Adjustments/Labs and Tests Ordered: Current medicines are reviewed at length with the patient today.  Concerns regarding medicines are outlined above.   Tests Ordered: Orders Placed This Encounter  Procedures   EKG 12-Lead    Medication Changes: No orders of the defined types were placed in this encounter.   Disposition:  Follow up 3 months  Signed Manjot Hinks Priya Koralee Wedeking, MD, 06/07/2024 11:45 AM    [facility] [address] [place], [place], [place] [address]

## 2024-06-08 ENCOUNTER — Ambulatory Visit: Admitting: Urology

## 2024-06-10 ENCOUNTER — Other Ambulatory Visit: Payer: Self-pay

## 2024-06-10 ENCOUNTER — Ambulatory Visit: Payer: Self-pay

## 2024-06-10 DIAGNOSIS — E1165 Type 2 diabetes mellitus with hyperglycemia: Secondary | ICD-10-CM

## 2024-06-13 ENCOUNTER — Ambulatory Visit

## 2024-06-14 ENCOUNTER — Telehealth (HOSPITAL_COMMUNITY): Payer: Self-pay | Admitting: Emergency Medicine

## 2024-06-14 NOTE — Telephone Encounter (Signed)
 Attempted to call patient regarding upcoming cardiac CT appointment. Left message on voicemail with name and callback number Rockwell Alexandria RN Navigator Cardiac Imaging [facility] [phone] Office [phone] Cell

## 2024-06-14 NOTE — Telephone Encounter (Signed)
 Reaching out to patient to offer assistance regarding upcoming cardiac imaging study; pt verbalizes understanding of appt date/time, parking situation and where to check in, pre-test NPO status and medications ordered, and verified current allergies; name and call back number provided for further questions should they arise Rockwell Alexandria RN Navigator Cardiac Imaging [facility] [phone] office [phone] cell

## 2024-06-15 ENCOUNTER — Ambulatory Visit (HOSPITAL_COMMUNITY)
Admission: RE | Admit: 2024-06-15 | Discharge: 2024-06-15 | Disposition: A | Discharge status: Home / Self Care | Source: Ambulatory Visit | Attending: Internal Medicine | Admitting: Internal Medicine

## 2024-06-15 DIAGNOSIS — I25119 Atherosclerotic heart disease of native coronary artery with unspecified angina pectoris: Secondary | ICD-10-CM

## 2024-06-15 DIAGNOSIS — I209 Angina pectoris, unspecified: Secondary | ICD-10-CM | POA: Insufficient documentation

## 2024-06-15 MED ORDER — IOHEXOL 350 MG/ML SOLN
100.0000 mL | Freq: Once | INTRAVENOUS | Status: AC | PRN
  Administered 2024-06-15: 100 mL via INTRAVENOUS

## 2024-06-15 MED ORDER — NITROGLYCERIN 0.4 MG SL SUBL
0.8000 mg | SUBLINGUAL_TABLET | Freq: Once | SUBLINGUAL | Status: AC
  Administered 2024-06-15: 0.8 mg via SUBLINGUAL

## 2024-06-16 ENCOUNTER — Other Ambulatory Visit: Payer: Self-pay | Admitting: Cardiology

## 2024-06-16 ENCOUNTER — Ambulatory Visit (HOSPITAL_COMMUNITY)
Admission: RE | Admit: 2024-06-16 | Discharge: 2024-06-16 | Disposition: A | Discharge status: Home / Self Care | Source: Ambulatory Visit | Attending: Cardiology | Admitting: Cardiology

## 2024-06-16 DIAGNOSIS — R931 Abnormal findings on diagnostic imaging of heart and coronary circulation: Secondary | ICD-10-CM

## 2024-06-16 DIAGNOSIS — R079 Chest pain, unspecified: Secondary | ICD-10-CM | POA: Diagnosis present

## 2024-06-22 ENCOUNTER — Telehealth: Payer: Self-pay | Admitting: Internal Medicine

## 2024-06-22 NOTE — Telephone Encounter (Signed)
 Pts wife calling to f/u on results from test performed 10/15. Please advise.

## 2024-06-23 MED ORDER — ASPIRIN 81 MG PO TBEC: 81.0000 mg | DELAYED_RELEASE_TABLET | Freq: Every day | ORAL | Status: AC

## 2024-06-23 NOTE — Telephone Encounter (Signed)
 The patient and wife have been notified of the result and verbalized understanding.  All questions (if any) were answered. Bernett Dorothyann LABOR, RN 06/23/2024 9:13 AM   We talked about reducing/quitting smoking, exercising, weight loss, eating a mediterranean diet going forward.  We also talked about monitoring NTG use and when to notify us  when he has a change in his NTG use.

## 2024-06-24 ENCOUNTER — Ambulatory Visit: Payer: Self-pay | Admitting: Internal Medicine

## 2024-06-28 ENCOUNTER — Ambulatory Visit (INDEPENDENT_AMBULATORY_CARE_PROVIDER_SITE_OTHER): Payer: Self-pay

## 2024-06-28 DIAGNOSIS — Z23 Encounter for immunization: Secondary | ICD-10-CM

## 2024-06-28 NOTE — Progress Notes (Signed)
 Patient is in office today for a nurse visit for flu shot. Patient Injection was given in the  Left deltoid. Patient tolerated injection well.

## 2024-06-29 ENCOUNTER — Ambulatory Visit: Admitting: Internal Medicine

## 2024-07-06 ENCOUNTER — Ambulatory Visit: Admitting: Orthopedic Surgery

## 2024-07-06 ENCOUNTER — Ambulatory Visit: Admitting: Internal Medicine

## 2024-07-11 ENCOUNTER — Ambulatory Visit (INDEPENDENT_AMBULATORY_CARE_PROVIDER_SITE_OTHER)

## 2024-07-11 DIAGNOSIS — L501 Idiopathic urticaria: Secondary | ICD-10-CM | POA: Diagnosis not present

## 2024-07-13 ENCOUNTER — Ambulatory Visit: Admitting: Gastroenterology

## 2024-07-13 ENCOUNTER — Encounter: Payer: Self-pay | Admitting: Gastroenterology

## 2024-07-13 VITALS — BP 160/80 | HR 64 | Temp 97.5°F | Ht 66.0 in | Wt 227.2 lb

## 2024-07-13 DIAGNOSIS — R5383 Other fatigue: Secondary | ICD-10-CM | POA: Diagnosis not present

## 2024-07-13 DIAGNOSIS — K921 Melena: Secondary | ICD-10-CM

## 2024-07-13 DIAGNOSIS — K219 Gastro-esophageal reflux disease without esophagitis: Secondary | ICD-10-CM

## 2024-07-13 DIAGNOSIS — R1084 Generalized abdominal pain: Secondary | ICD-10-CM | POA: Diagnosis not present

## 2024-07-13 DIAGNOSIS — K589 Irritable bowel syndrome without diarrhea: Secondary | ICD-10-CM

## 2024-07-13 MED ORDER — PANTOPRAZOLE SODIUM 40 MG PO TBEC: 40.0000 mg | DELAYED_RELEASE_TABLET | Freq: Two times a day (BID) | ORAL | 5 refills | Status: AC

## 2024-07-13 NOTE — Patient Instructions (Signed)
 I have increased pantoprazole  to twice a day, 30 minutes before breakfast and dinner.  I have ordered blood work you can do downstairs at [facility].  CT has been ordered as soon as possible.  We are also arranging an upper endoscopy as soon as possible. Take 1/2 dose of Lantus  the evening before the endoscopy!  Further recommendations to follow!  It was a pleasure to see you today. I want to create trusting relationships with patients and provide genuine, compassionate, and quality care. I truly value your feedback, so please be on the lookout for a survey regarding your visit with me today. I appreciate your time in completing this!         Therisa MICAEL Stager, PhD, ANP-BC [facility]

## 2024-07-13 NOTE — Progress Notes (Signed)
 Gastroenterology Office Note     Primary Care Physician:  Bevely Doffing, FNP  Primary Gastroenterologist: Dr. Cindie   Chief Complaint   Chief Complaint  Patient presents with   Follow-up    Patient here today due to having Left upper and lower Abdominal pain and loss of appetite. Patient with history of umbilical hernia. He had it repaired by Dr. Floria years ago. Patient says he has had issue with loss of appetite for a month now, and has nausea at times. He does report his stools are also black, denies any sight of bright red blood in stools. Patient reports extreme fatigue. Last hgb 14.8 on Jun 07, 2024. Patient does not take any po fe. Patient takes pepto daily for pain/discomfort.     History of Present Illness   Christopher [name] is a 68 y.o. male presenting today with a history of chronic GERD, IBS mixed, colon polyps, last seen in March 2025 and completed colonoscopy in interim from visit as noted below. Multiple polyps on colonoscopy and will need surveillance in 1 year.   Notes chronic abdominal pain. Hx of umbilical hernia repair s/p mesh. Has abdominal cramps diffusely. Has been going on for years but worsening. Takes pepto to get through the day. Worsened after drinking coffee this morning, worsened postprandially. Can happen without eating/drinking. Associated nausea. Stools are black all the time for past 2 months. Occasional brown stool but this week predominantly black. Notes fatigue. No oral iron.   Meloxicam  BID. Pantoprazole  daily. GERD not controlled. Gallbladder absent. Pain awakens at night. Poor appetite. No weight loss. No worsening pain with movement.   Waking up sweating. No chills.   IBS symptoms at baseline. Will come and go. Certain foods cause diarrhea. Pork caused diarrhea last night. No obvious pain with beef, red meat, etc.   EGD 2021: normal esophagus, gastritis s/p biopsy, antral GIM, negative H.pylori, normal duodenum.   Colonoscopy April  2025: internal hemorrhoids, two 7-8 mm polyps in cecum, 12 sessile polyps in transverse that were 4-8 mm in size, 1 year surveillance. Path with tubular adenomas.    2019 with 5 tubular adenomas removed. 2 prior colonoscopies before this both with polyps removed as well.   No FH colon cancer FH thalassemia  Past Medical History:  Diagnosis Date   Anxiety    Arthritis    Asthma    BPH (benign prostatic hyperplasia)    Complication of anesthesia    pt had a hard time being able to move after spinal anesthesia , 3-4 hours   COPD (chronic obstructive pulmonary disease) (HCC)    Depression    Diabetes mellitus without complication (HCC)    GERD (gastroesophageal reflux disease)    Gout    no meds   Headache(784.0)    otc meds prn   Heart murmur    dx as a child, no problems as an adult   History of COVID-19 2024   History of hiatal hernia    Hyperlipidemia    IBS (irritable bowel syndrome)    PONV (postoperative nausea and vomiting)    Sleep apnea    uses CIPAP machine at night   Wears partial dentures    bottom partial    Past Surgical History:  Procedure Laterality Date   ANTERIOR CERVICAL DECOMP/DISCECTOMY FUSION N/A 10/13/2022   Procedure: ANTERIOR CERVICAL DISECTOMY FUSION - CERVICAL THREE-CERVICAL FOUR - CERVICAL FOUR-CERVICAL FIVE REMOVAL OF HARDWARE CERVICAL FIVE-CERVICAL SIX;  Surgeon: [name], MD;  Location:  [facility] OR;  Service: Neurosurgery;  Laterality: N/A;   APPENDECTOMY     BACK SURGERY  2002   neck and back fusion   BIOPSY  12/27/2015   Procedure: BIOPSY;  Surgeon: Claudis RAYMOND Rivet, MD;  Location: [facility];  Service: Endoscopy;;  Fundus biopsies and duodenal biopsies   BIOPSY  02/10/2020   Procedure: BIOPSY;  Surgeon: Rivet Claudis RAYMOND, MD;  Location: [facility];  Service: Endoscopy;;  antral   CARDIAC CATHETERIZATION     CARDIAC CATHETERIZATION N/A 09/04/2016   Procedure: Right/Left Heart Cath and Coronary Angiography;  Surgeon: Peter M Jordan, MD;   Location: [facility] INVASIVE CV LAB;  Service: Cardiovascular;  Laterality: N/A;   CHOLECYSTECTOMY     CHONDROPLASTY  08/15/2011   Procedure: CHONDROPLASTY;  Surgeon: Taft Minerva, MD;  Location: [facility] ORS;  Service: Orthopedics;  Laterality: Left;   COLONOSCOPY  06/27/2011   Procedure: COLONOSCOPY;  Surgeon: Claudis RAYMOND Rivet, MD;  [facility]: [facility];  Service: Endoscopy;  Laterality: N/A;  9:00 / Pt to be here at 9am for 10:45 procedure, benign polyps removed   COLONOSCOPY N/A 10/05/2014   Procedure: COLONOSCOPY;  Surgeon: Claudis RAYMOND Rivet, MD;  [facility];  Service: Endoscopy;  Laterality: N/A;  930   COLONOSCOPY N/A 02/11/2018   Procedure: COLONOSCOPY;  Surgeon: Rivet Claudis RAYMOND, MD;  [facility];  Service: Endoscopy;  Laterality: N/A;  830   COLONOSCOPY N/A 12/02/2023   Procedure: COLONOSCOPY;  Surgeon: Cindie Carlin POUR, DO;  [facility]: [facility];  Service: Endoscopy;  Laterality: N/A;  845am, asa 3   ESOPHAGOGASTRODUODENOSCOPY N/A 12/27/2015   Procedure: ESOPHAGOGASTRODUODENOSCOPY (EGD);  Surgeon: Claudis RAYMOND Rivet, MD;  [facility];  Service: Endoscopy;  Laterality: N/A;  3:00   ESOPHAGOGASTRODUODENOSCOPY (EGD) WITH PROPOFOL  N/A 02/10/2020   Procedure: ESOPHAGOGASTRODUODENOSCOPY (EGD) WITH PROPOFOL ;  Surgeon: Rivet Claudis RAYMOND, MD;  [facility];  Service: Endoscopy;  Laterality: N/A;  155   HERNIA REPAIR  1998   umbilical hernia   JOINT REPLACEMENT  2023   knee and shoulder-bilateral knees and left shoulder   KNEE ARTHROSCOPY     left knee   KNEE ARTHROSCOPY     right knee    LUMBAR LAMINECTOMY/DECOMPRESSION MICRODISCECTOMY  09/14/2012   Procedure: LUMBAR LAMINECTOMY/DECOMPRESSION MICRODISCECTOMY 1 LEVEL;  Surgeon: Catalina CHRISTELLA Stains, MD;  Location: [facility];  Service: Neurosurgery;  Laterality: Right;  Right Lumbar three-four Diskectomy   neck fusion  2005   NECK SURGERY     PATELLA-FEMORAL ARTHROPLASTY Right 02/18/2022   Procedure:  RIGHT KNEE PATELLA REPLACEMENT, CEMENTED;  Surgeon: Addie Cordella Hamilton, MD;  Location: [facility] OR;  Service: Orthopedics;  Laterality: Right;   POLYPECTOMY  02/11/2018   Procedure: POLYPECTOMY;  Surgeon: Rivet Claudis RAYMOND, MD;  [facility];  Service: Endoscopy;;  colon   POLYPECTOMY  12/02/2023   Procedure: POLYPECTOMY;  Surgeon: Cindie Carlin POUR, DO;  [facility];  Service: Endoscopy;;   POSTERIOR CERVICAL FUSION/FORAMINOTOMY N/A 09/04/2023   Procedure: Posterior Cervical Fusion, Cervical Five-Cervical Four and Cervical Three-Cervical Three-Cervical Four;  Surgeon: [name], MD;  Location: [facility] OR;  Service: Neurosurgery;  Laterality: N/A;   REVISION TOTAL SHOULDER TO REVERSE TOTAL SHOULDER Left 12/16/2018   Procedure: REVISION TOTAL SHOULDER TO REVERSE TOTAL SHOULDER;  Surgeon: Addie Cordella Hamilton, MD;  Location: [facility] OR;  Service: Orthopedics;  Laterality: Left;   SHOULDER ARTHROSCOPY WITH BICEPSTENOTOMY Left 09/23/2013   Procedure: SHOULDER ARTHROSCOPY WITH BICEPSTENOTOMY  AND EXTENSIVE DEBRIDEMENT;  Surgeon: Taft FORBES Minerva, MD;  Location: [facility];  Service: Orthopedics;  Laterality: Left;   SHOULDER ARTHROSCOPY WITH ROTATOR CUFF REPAIR Left 05/05/2014   Procedure: SHOULDER ARTHROSCOPY LIMITED DEBRIDEMENT;  Surgeon: Taft FORBES Minerva, MD;  Location: [facility];  Service: Orthopedics;  Laterality: Left;   SHOULDER OPEN ROTATOR CUFF REPAIR Left 05/05/2014   Procedure: ROTATOR CUFF REPAIR SHOULDER OPEN;  Surgeon: Taft FORBES Minerva, MD;  Location: [facility];  Service: Orthopedics;  Laterality: Left;   SHOULDER OPEN ROTATOR CUFF REPAIR Left 12/16/2018   Procedure: LEFT SHOULDER POSSIBLE SUBSCAPULARIS REPAIR VS. REVISION TO REVERSE TOTAL SHOULDER REPLACEMENT;  Surgeon: Addie Cordella Hamilton, MD;  Location: [facility] OR;  Service: Orthopedics;  Laterality: Left;   SHOULDER SURGERY Right    Open Mumford procedure   SPINAL FUSION     x2, 2003 and 2007   TOTAL KNEE ARTHROPLASTY Left 06/16/2017    Procedure: LEFT TOTAL KNEE ARTHROPLASTY;  Surgeon: Minerva Taft FORBES, MD;  Location: [facility];  Service: Orthopedics;  Laterality: Left;   TOTAL KNEE ARTHROPLASTY Right 05/14/2021   Procedure: TOTAL KNEE ARTHROPLASTY;  Surgeon: Minerva Taft FORBES, MD;  Location: [facility];  Service: Orthopedics;  Laterality: Right;   TOTAL SHOULDER ARTHROPLASTY Left 08/19/2018   Procedure: left shoulder replacement;  Surgeon: Addie Cordella Hamilton, MD;  Location: [facility] OR;  Service: Orthopedics;  Laterality: Left;    Current Outpatient Medications  Medication Sig Dispense Refill   albuterol  (VENTOLIN  HFA) 108 (90 Base) MCG/ACT inhaler inhale 2 puffs into lungs every 6 hours as needed for wheezing or shortness of breath 6.7 g 0   amLODipine  (NORVASC ) 5 MG tablet Take 1 tablet (5 mg total) by mouth daily. 90 tablet 3   aspirin  EC 81 MG tablet Take 1 tablet (81 mg total) by mouth daily. Swallow whole.     Blood Glucose Monitoring Suppl (ONE TOUCH ULTRA 2) w/Device KIT USE TO TEST BLOOD SUGAR THREE TIMES DAILY. 1 kit 0   Blood Glucose Monitoring Suppl DEVI 1 each by Does not apply route in the morning, at noon, and at bedtime. May substitute to any manufacturer covered by patient's insurance. 1 each 0   cyclobenzaprine  (FLEXERIL ) 10 MG tablet Take 1 tablet (10 mg total) by mouth 3 (three) times daily as needed for muscle spasms. 30 tablet 3   EPINEPHrine  0.3 mg/0.3 mL IJ SOAJ injection Inject 0.3 mg into the muscle as needed for anaphylaxis. 2 each 1   famotidine  (PEPCID ) 40 MG tablet Take 1 tablet (40 mg total) by mouth 2 (two) times daily as needed (hives). 60 tablet 5   fexofenadine  (ALLEGRA  ALLERGY ) 180 MG tablet Take 2 tablets (360 mg total) by mouth 2 (two) times daily as needed (hives). 60 tablet 5   GLOBAL EASE INJECT PEN NEEDLES 31G X 5 MM MISC Inject 1 Syringe into the skin daily. 100 each 0   glucose blood (ONETOUCH ULTRA TEST) test strip Use daily to check blood sugars. 100 each 12   hydrOXYzine  (VISTARIL ) 25 MG  capsule TAKE 1-2 CAPSULES BY MOUTH AT BEDTIME AS NEEDED. 30 capsule 5   Lancets (ONETOUCH DELICA PLUS LANCET33G) MISC 1 each by Does not apply route in the morning, at noon, and at bedtime. 100 each 0   LANTUS  SOLOSTAR 100 UNIT/ML Solostar Pen Inject 20 Units into the skin at bedtime.     meloxicam  (MOBIC ) 7.5 MG tablet Take 7.5 mg by mouth 2 (two) times daily.     Menthol , Topical Analgesic, (  FREEZE IT FAST PAIN RELIEF EX) Apply 1 Application topically daily. 10%     montelukast  (SINGULAIR ) 10 MG tablet Take 1 tablet (10 mg total) by mouth at bedtime. 30 tablet 5   nitroGLYCERIN  (NITROSTAT ) 0.4 MG SL tablet PLACE (1) TABLET UNDER TONGUE EVERY 5 MINUTES UP TO (3) DOSES. IF NO RELIEF CALL 911. 25 tablet 3   omalizumab  (XOLAIR ) 300 MG/2  ML prefilled syringe Inject 300 mg into the skin every 28 (twenty-eight) days. 2 mL 11   ondansetron  (ZOFRAN ) 4 MG tablet Take 1 tablet (4 mg total) by mouth every 8 (eight) hours as needed for nausea. 20 tablet 0   pantoprazole  (PROTONIX ) 40 MG tablet TAKE (1) TABLET TWICE A DAY BEFORE MEALS. (Patient taking differently: Take 40 mg by mouth daily.) 60 tablet 0   rosuvastatin  (CRESTOR ) 40 MG tablet Take 1 tablet (40 mg total) by mouth daily. 90 tablet 3   sertraline  (ZOLOFT ) 50 MG tablet Take 1 tablet (50 mg total) by mouth in the morning. 90 tablet 1   Testosterone  Undecanoate (JATENZO ) 237 MG CAPS Take 1 capsule (237 mg total) by mouth in the morning and at bedtime. 60 capsule 5   Tiotropium Bromide-Olodaterol (STIOLTO RESPIMAT ) 2.5-2.5 MCG/ACT AERS Inhale 2 puffs into the lungs daily. (Patient not taking: Reported on 07/13/2024) 1 each 5   Current Facility-Administered Medications  Medication Dose Route Frequency Provider Last Rate Last Admin   omalizumab  (XOLAIR ) prefilled syringe 300 mg  300 mg Subcutaneous Q28 days Iva Marty Saltness, MD   300 mg at 07/11/24 1109    Allergies as of 07/13/2024 - Review Complete 07/13/2024  Allergen Reaction Noted    Celebrex [celecoxib] Itching and Swelling 09/02/2012   Codeine Nausea And Vomiting and Other (See Comments) 01/19/2023   Cortisone Swelling 09/14/2012   Doxycycline Swelling and Other (See Comments)    Medrol  [methylprednisolone ] Hives and Swelling 10/26/2022   Prednisone  Swelling    Relafen [nabumetone] Swelling 08/04/2011   Aspirin  Other (See Comments) 07/11/2021   Oxycodone -acetaminophen  Itching and Other (See Comments)    Raloxifene Rash 01/19/2023   Rofecoxib Rash 01/19/2023    Family History  Problem Relation Age of Onset   Diabetes Mother    Alzheimer's disease Father    Diabetes Father    Diabetes Other    Lung disease Other    Arthritis Other    Anesthesia problems Neg Hx    Hypotension Neg Hx    Malignant hyperthermia Neg Hx    Pseudochol deficiency Neg Hx    Sleep apnea Neg Hx     Social History   Socioeconomic History   Marital status: Married    Spouse name: Not on file   Number of children: Not on file   Years of education: Not on file   Highest education level: Not on file  Occupational History   Occupation: therapist, music    Employer: DISABLED  Tobacco Use   Smoking status: Every Day    Current packs/day: 1.00    Average packs/day: 1 pack/day for 45.9 years (45.9 ttl pk-yrs)    Types: Cigarettes    Start date: 08/25/1978    Passive exposure: Current   Smokeless tobacco: Never   Tobacco comments:    smokes a pack a day. since age 27  Vaping Use   Vaping status: Never Used  Substance and Sexual Activity   Alcohol use: Never   Drug use: Never   Sexual activity: Yes  Other Topics Concern   Not  on file  Social History Narrative   Lives at home with wife   Right handed   Caffeine: 2 cups of coffee, 2 cans of soda, 4-5 cups of tea daily.   Social Drivers of Corporate Investment Banker Strain: Low Risk  (10/20/2023)   Overall Financial Resource Strain (CARDIA)    Difficulty of Paying Living Expenses: Not hard at all  Food Insecurity: No Food  Insecurity (10/20/2023)   Hunger Vital Sign    Worried About Running Out of Food in the Last Year: Never true    Ran Out of Food in the Last Year: Never true  Transportation Needs: No Transportation Needs (10/20/2023)   PRAPARE - Administrator, Civil Service (Medical): No    Lack of Transportation (Non-Medical): No  Physical Activity: Inactive (10/20/2023)   Exercise Vital Sign    Days of Exercise per Week: 0 days    Minutes of Exercise per Session: 0 min  Stress: No Stress Concern Present (10/20/2023)   [employer] of Occupational Health - Occupational Stress Questionnaire    Feeling of Stress : Not at all  Social Connections: Socially Isolated (10/20/2023)   Social Connection and Isolation Panel    Frequency of Communication with Friends and Family: Once a week    Frequency of Social Gatherings with Friends and Family: Once a week    Attends Religious Services: Never    Database Administrator or Organizations: No    Attends Banker Meetings: Never    Marital Status: Married  Catering Manager Violence: Not At Risk (10/20/2023)   Humiliation, Afraid, Rape, and Kick questionnaire    Fear of Current or Ex-Partner: No    Emotionally Abused: No    Physically Abused: No    Sexually Abused: No     Review of Systems   Gen: Denies any fever, chills, fatigue, weight loss, lack of appetite.  CV: Denies chest pain, heart palpitations, peripheral edema, syncope.  Resp: Denies shortness of breath at rest or with exertion. Denies wheezing or cough.  GI: Denies dysphagia or odynophagia. Denies jaundice, hematemesis, fecal incontinence. GU : Denies urinary burning, urinary frequency, urinary hesitancy MS: Denies joint pain, muscle weakness, cramps, or limitation of movement.  Derm: Denies rash, itching, dry skin Psych: Denies depression, anxiety, memory loss, and confusion Heme: Denies bruising, bleeding, and enlarged lymph nodes.   Physical Exam   BP (!)  160/80 (BP Location: Left Arm, Patient Position: Sitting, Cuff Size: Large)   Pulse 64   Temp (!) 97.5 F (36.4 C) (Temporal)   Ht 5' 6 (1.676 m)   Wt 227 lb 3.2 oz (103.1 kg)   BMI 36.67 kg/m  General:   Alert and oriented. Pleasant and cooperative. Well-nourished and well-developed.  Head:  Normocephalic and atraumatic. Eyes:  Without icterus Abdomen:  +BS, soft, obese, large diastasis recti vs ventral hernia, TTP LUQ/LLQ, lower abdomen, no significant tenderness at ventral hernia site Rectal:  Deferred  Msk:  Symmetrical without gross deformities. Normal posture. Extremities:  Without edema. Neurologic:  Alert and  oriented x4;  grossly normal neurologically. Skin:  Intact without significant lesions or rashes. Psych:  Alert and cooperative. Normal mood and affect.   Assessment   Christopher [name] is a 68 y.o. male presenting today with a history of chronic GERD, IBS mixed, colon polyps, last seen in March 2025 and completed colonoscopy in interim with multiple colon polyps and surveillance in 1 year recommended. Now noting persistent chronic  abdominal pain that is worsening, nausea.   Abdominal pain: chronically over the years but now worsened postprandially with associated nausea, black stool, currently taking meloxicam  BID and PPI daily. Concerning symptoms of awakening at night with pain, poor appetite. No weight loss noted. Concern for gastritis, PUD, less likely pancreatitis, retained gallstones (gallbladder absent). Will check alpha gal and labs, arrange EGD in near future and CT as soon as possbile.  IBS: symptoms at baseline with certain foods such as pork triggering diarrhea.     PLAN    CBC, CMP, lipase, iron studies, alpha gal CT abd/pelvis with contrast EGD with Dr. Cindie Increase PPI to BID Further rec's to follow   Therisa MICAEL Stager, PhD, ANP-BC [facility]   ADDENDUM: CBC normal, no IDA, lipase normal, LFTs normal, alpha gal negative EGD   with small hiatal hernia, diffuse moderate inflammation and shallow ulcerations in entire stomach s/p biopsy, localized mild inflammation with edema and erythema in duodenal bulb. path: reactive and erosive gastritis, negative H.pylori, negative  intestinal metaplasia  CT abd/pelvis with contrast noted no acute findings, and it showed hepatic steatosis. No CBD dilation, pancreas normal.

## 2024-07-14 ENCOUNTER — Ambulatory Visit (HOSPITAL_COMMUNITY)
Admission: RE | Admit: 2024-07-14 | Discharge: 2024-07-14 | Disposition: A | Discharge status: Home / Self Care | Source: Ambulatory Visit | Attending: Gastroenterology | Admitting: Gastroenterology

## 2024-07-14 DIAGNOSIS — R1084 Generalized abdominal pain: Secondary | ICD-10-CM | POA: Insufficient documentation

## 2024-07-14 MED ORDER — IOHEXOL 300 MG/ML  SOLN
100.0000 mL | Freq: Once | INTRAMUSCULAR | Status: AC | PRN
  Administered 2024-07-14: 100 mL via INTRAVENOUS

## 2024-07-15 ENCOUNTER — Other Ambulatory Visit: Payer: Self-pay

## 2024-07-15 ENCOUNTER — Encounter (HOSPITAL_COMMUNITY)
Admission: RE | Admit: 2024-07-15 | Discharge: 2024-07-15 | Disposition: A | Discharge status: Home / Self Care | Source: Ambulatory Visit | Attending: Internal Medicine | Admitting: Internal Medicine

## 2024-07-15 NOTE — Pre-Procedure Instructions (Signed)
 Spoke w/ via phone for pre-op interview Christopher [name] and his wife . Lab needs dos----    n/a     Lab results see chart COVID test -----patient states asymptomatic no test needed Arrive at 12:00 p.m. NPO after MN NO Solid Food.  Clear liquids from MN until 10:00 Pre-Surgery Ensure or G2: n/a  Med rec completed Medications to take morning of surgery amlodipine, pepcid , mobic  protonix , serteraline, zofran   Diabetic medication 1/2 dose of lantus  night before surgery  GLP1 agonist last dose:n/a  GLP1 instructions: n/a  Patient instructed no nail polish to be worn day of surgery Patient instructed to bring photo id and insurance card day of surgery Patient aware to have Driver (ride ) / caregiver    for 24 hours after surgery -  Patient Special Instructions ----- Pre-Op special Instructions -----  Patient verbalized understanding of instructions that were given at this phone interview. Patient denies chest pain, sob, fever, cough at the interview.

## 2024-07-16 LAB — ALPHA-GAL PANEL
Allergen Lamb IgE: <0.1 kU/L
Beef IgE: <0.1 kU/L
IgE (Immunoglobulin E), Serum: 56 [IU]/mL (ref 6–495)
O215-IgE Alpha-Gal: <0.1 kU/L
Pork IgE: <0.1 kU/L

## 2024-07-16 LAB — CBC WITH DIFFERENTIAL/PLATELET
Basophils Absolute: 0.1 x10E3/uL (ref 0.0–0.2)
Basos: 1 %
EOS (ABSOLUTE): 0 x10E3/uL (ref 0.0–0.4)
Eos: 0 %
Hematocrit: 43.7 % (ref 37.5–51.0)
Hemoglobin: 14.7 g/dL (ref 13.0–17.7)
Immature Grans (Abs): 0.1 x10E3/uL (ref 0.0–0.1)
Immature Granulocytes: 1 %
Lymphocytes Absolute: 2 x10E3/uL (ref 0.7–3.1)
Lymphs: 23 %
MCH: 28.5 pg (ref 26.6–33.0)
MCHC: 33.6 g/dL (ref 31.5–35.7)
MCV: 85 fL (ref 79–97)
Monocytes Absolute: 0.5 x10E3/uL (ref 0.1–0.9)
Monocytes: 6 %
Neutrophils Absolute: 6.3 x10E3/uL (ref 1.4–7.0)
Neutrophils: 69 %
Platelets: 216 x10E3/uL (ref 150–450)
RBC: 5.16 x10E6/uL (ref 4.14–5.80)
RDW: 14.7 % (ref 11.6–15.4)
WBC: 9 x10E3/uL (ref 3.4–10.8)

## 2024-07-16 LAB — COMPREHENSIVE METABOLIC PANEL WITH GFR
ALT: 24 IU/L (ref 0–44)
AST: 14 IU/L (ref 0–40)
Albumin: 4.5 g/dL (ref 3.9–4.9)
Alkaline Phosphatase: 79 IU/L (ref 47–123)
BUN/Creatinine Ratio: 19 (ref 10–24)
BUN: 19 mg/dL (ref 8–27)
Bilirubin Total: 0.3 mg/dL (ref 0.0–1.2)
CO2: 23 mmol/L (ref 20–29)
Calcium: 9.2 mg/dL (ref 8.6–10.2)
Chloride: 100 mmol/L (ref 96–106)
Creatinine, Ser: 1.01 mg/dL (ref 0.76–1.27)
Globulin, Total: 1.9 g/dL (ref 1.5–4.5)
Glucose: 161 mg/dL — ABNORMAL HIGH (ref 70–99)
Potassium: 4.7 mmol/L (ref 3.5–5.2)
Sodium: 137 mmol/L (ref 134–144)
Total Protein: 6.4 g/dL (ref 6.0–8.5)
eGFR: 82 mL/min/1.73 (ref >59)

## 2024-07-16 LAB — IRON,TIBC AND FERRITIN PANEL
Ferritin: 139 ng/mL (ref 30–400)
Iron Saturation: 21 % (ref 15–55)
Iron: 75 ug/dL (ref 38–169)
Total Iron Binding Capacity: 360 ug/dL (ref 250–450)
UIBC: 285 ug/dL (ref 111–343)

## 2024-07-16 LAB — LIPASE: Lipase: 33 U/L (ref 13–78)

## 2024-07-18 ENCOUNTER — Ambulatory Visit (HOSPITAL_COMMUNITY): Admitting: Anesthesiology

## 2024-07-18 ENCOUNTER — Encounter (HOSPITAL_COMMUNITY): Payer: Self-pay | Admitting: Internal Medicine

## 2024-07-18 ENCOUNTER — Ambulatory Visit (HOSPITAL_COMMUNITY)
Admission: RE | Admit: 2024-07-18 | Discharge: 2024-07-18 | Disposition: A | Discharge status: Home / Self Care | Attending: Internal Medicine | Admitting: Internal Medicine

## 2024-07-18 ENCOUNTER — Encounter (HOSPITAL_COMMUNITY)
Admission: RE | Disposition: A | Discharge status: Home / Self Care | Payer: Self-pay | Source: Home / Self Care | Attending: Internal Medicine

## 2024-07-18 DIAGNOSIS — K219 Gastro-esophageal reflux disease without esophagitis: Secondary | ICD-10-CM

## 2024-07-18 DIAGNOSIS — K2980 Duodenitis without bleeding: Secondary | ICD-10-CM

## 2024-07-18 DIAGNOSIS — K921 Melena: Secondary | ICD-10-CM | POA: Diagnosis present

## 2024-07-18 DIAGNOSIS — Z794 Long term (current) use of insulin: Secondary | ICD-10-CM | POA: Insufficient documentation

## 2024-07-18 DIAGNOSIS — G4730 Sleep apnea, unspecified: Secondary | ICD-10-CM | POA: Insufficient documentation

## 2024-07-18 DIAGNOSIS — F17210 Nicotine dependence, cigarettes, uncomplicated: Secondary | ICD-10-CM | POA: Diagnosis not present

## 2024-07-18 DIAGNOSIS — K2960 Other gastritis without bleeding: Secondary | ICD-10-CM

## 2024-07-18 DIAGNOSIS — Z604 Social exclusion and rejection: Secondary | ICD-10-CM | POA: Insufficient documentation

## 2024-07-18 DIAGNOSIS — I10 Essential (primary) hypertension: Secondary | ICD-10-CM | POA: Insufficient documentation

## 2024-07-18 DIAGNOSIS — E119 Type 2 diabetes mellitus without complications: Secondary | ICD-10-CM | POA: Insufficient documentation

## 2024-07-18 DIAGNOSIS — K449 Diaphragmatic hernia without obstruction or gangrene: Secondary | ICD-10-CM | POA: Diagnosis not present

## 2024-07-18 DIAGNOSIS — J449 Chronic obstructive pulmonary disease, unspecified: Secondary | ICD-10-CM | POA: Diagnosis not present

## 2024-07-18 DIAGNOSIS — R109 Unspecified abdominal pain: Secondary | ICD-10-CM | POA: Diagnosis present

## 2024-07-18 DIAGNOSIS — K2990 Gastroduodenitis, unspecified, without bleeding: Secondary | ICD-10-CM

## 2024-07-18 DIAGNOSIS — R12 Heartburn: Secondary | ICD-10-CM | POA: Diagnosis present

## 2024-07-18 DIAGNOSIS — E1165 Type 2 diabetes mellitus with hyperglycemia: Secondary | ICD-10-CM

## 2024-07-18 HISTORY — PX: ESOPHAGOGASTRODUODENOSCOPY: SHX5428

## 2024-07-18 LAB — GLUCOSE, CAPILLARY: Glucose-Capillary: 117 mg/dL — ABNORMAL HIGH (ref 70–99)

## 2024-07-18 SURGERY — EGD (ESOPHAGOGASTRODUODENOSCOPY)
Anesthesia: General

## 2024-07-18 MED ORDER — LACTATED RINGERS IV SOLN: INTRAVENOUS | Status: DC

## 2024-07-18 MED ORDER — SUCRALFATE 1 G PO TABS: 1.0000 g | ORAL_TABLET | Freq: Four times a day (QID) | ORAL | 1 refills | Status: AC

## 2024-07-18 MED ORDER — PROPOFOL 10 MG/ML IV BOLUS
INTRAVENOUS | Status: DC | PRN
  Administered 2024-07-18 (×2): 50 mg via INTRAVENOUS
  Administered 2024-07-18: 100 mg via INTRAVENOUS

## 2024-07-18 MED ORDER — LIDOCAINE HCL 1 % IJ SOLN
INTRAMUSCULAR | Status: DC | PRN
  Administered 2024-07-18: 50 mg via INTRADERMAL

## 2024-07-18 NOTE — Op Note (Signed)
 [facility] Patient Name: Christopher [name] Procedure Date: 07/18/2024 1:31 PM MRN: [id number] Date of Birth: 1955-09-02 Attending MD: Carlin POUR. Cindie , [place], [id number] CSN: [id number] Age: 68 Admit Type: Outpatient Procedure:                Upper GI endoscopy Indications:              Generalized abdominal pain, Heartburn, Melena Providers:                Carlin POUR. Cindie, DO, Tammy Vaught, RN, Chad                            Wilson, Technician Referring MD:              Medicines:                See the Anesthesia note for documentation of the                            administered medications Complications:            No immediate complications. Estimated Blood Loss:     Estimated blood loss was minimal. Procedure:                Pre-Anesthesia Assessment:                           - The anesthesia plan was to use monitored                            anesthesia care (MAC).                           After obtaining informed consent, the endoscope was                            passed under direct vision. Throughout the                            procedure, the patient's blood pressure, pulse, and                            oxygen saturations were monitored continuously. The                            [id number] ([id number]) Upper was introduced through                            the mouth, and advanced to the second part of                            duodenum. The upper GI endoscopy was accomplished                            without difficulty. The patient tolerated the                            procedure well. Scope  In: 1:45:05 PM Scope Out: 1:49:01 PM Total Procedure Duration: 0 hours 3 minutes 56 seconds  Findings:      A small hiatal hernia was present.      Diffuse moderate inflammation characterized by erosions, erythema and       shallow ulcerations was found in the entire examined stomach. Biopsies       were taken with a cold forceps for Helicobacter pylori  testing.      Localized mild inflammation characterized by congestion (edema) and       erythema was found in the duodenal bulb.      The second portion of the duodenum was normal. Impression:               - Small hiatal hernia.                           - Gastritis. Biopsied.                           - Duodenitis.                           - Normal second portion of the duodenum. Moderate Sedation:      Per Anesthesia Care Recommendation:           - Patient has a contact number available for                            emergencies. The signs and symptoms of potential                            delayed complications were discussed with the                            patient. Return to normal activities tomorrow.                            Written discharge instructions were provided to the                            patient.                           - Resume previous diet.                           - Continue present medications.                           - Await pathology results.                           - Use a proton pump inhibitor PO BID.                           - Use sucralfate tablets 1 gram PO QID for 2 weeks.                           -  Return to GI clinic in 8 weeks. Procedure Code(s):        --- Professional ---                           [phone], Esophagogastroduodenoscopy, flexible,                            transoral; with biopsy, single or multiple Diagnosis Code(s):        --- Professional ---                           K44.9, Diaphragmatic hernia without obstruction or                            gangrene                           K29.70, Gastritis, unspecified, without bleeding                           K29.80, Duodenitis without bleeding                           R10.84, Generalized abdominal pain                           R12, Heartburn                           K92.1, Melena (includes Hematochezia) CPT copyright 2022 American Medical Association. All rights  reserved. The codes documented in this report are preliminary and upon coder review may  be revised to meet current compliance requirements. Carlin POUR. Cindie, DO Carlin POUR. Cindie, DO 07/18/2024 1:52:29 PM This report has been signed electronically. Number of Addenda: 0

## 2024-07-18 NOTE — Anesthesia Preprocedure Evaluation (Addendum)
 Anesthesia Evaluation  Patient identified by MRN, date of birth, ID band Patient awake    Reviewed: Allergy  & Precautions, NPO status , Patient's Chart, lab work & pertinent test results, reviewed documented beta blocker date and time   History of Anesthesia Complications (+) PONV and history of anesthetic complications  Airway Mallampati: III  TM Distance: >3 FB Neck ROM: Limited    Dental  (+) Edentulous Upper, Edentulous Lower   Pulmonary asthma , sleep apnea , COPD, Current Smoker and Patient abstained from smoking., former smoker   breath sounds clear to auscultation       Cardiovascular hypertension, + angina with exertion + DOE  (-) CAD, (-) Past MI and (-) Cardiac Stents Normal cardiovascular exam(-) pacemaker(-) Cardiac Defibrillator + Valvular Problems/Murmurs  Rhythm:Regular Rate:Normal  Nonobstructive CAD, normal LV/RV function   Neuro/Psych  Headaches, neg Seizures PSYCHIATRIC DISORDERS Anxiety Depression       GI/Hepatic hiatal hernia,GERD  ,,(+) neg Cirrhosis        Endo/Other  diabetes, Well Controlled, Type 2  Class 3 obesity  Renal/GU Renal disease     Musculoskeletal  (+) Arthritis ,    Abdominal  (+) + obese  Peds  Hematology   Anesthesia Other Findings   Reproductive/Obstetrics                              Anesthesia Physical Anesthesia Plan  ASA: 3  Anesthesia Plan: General   Post-op Pain Management: Minimal or no pain anticipated   Induction: Intravenous  PONV Risk Score and Plan: Propofol  infusion  Airway Management Planned: Nasal Cannula and Natural Airway  Additional Equipment: None  Intra-op Plan:   Post-operative Plan:   Informed Consent: I have reviewed the patients History and Physical, chart, labs and discussed the procedure including the risks, benefits and alternatives for the proposed anesthesia with the patient or authorized representative  who has indicated his/her understanding and acceptance.     Dental advisory given  Plan Discussed with: CRNA  Anesthesia Plan Comments: (PAT note by Lynwood Hope, PA-C: Pt seen by cardiology 2018 for eval of intermittent CP and coronary calcifications seen on CT. Had cath 09/04/2016 showing diffuse mild nonobstructive disease, normal LV function EF 55-65%, normal right heart and LV filling pressures, normal cardiac output. Medical therapy recommended.  He was last seen by Dr. Mallipeddi 09/26/2022 for preop evaluation.  Per note, Patient has chronic stable DOE since 2018 and has not improved or worsened.  He reported having 1 episode of chest heaviness with SOB and denied any bilateral lower extremity swelling. EKG showed sinus bradycardia and an incomplete right bundle branch block. LHC and RHC from 2018 showed diffuse mild nonobstructive CAD, normal LV function, normal right heart and LV filling pressures and normal cardiac output. I do not think this chronic DOE is ischemic in etiology given the fact that he underwent LHC and RHC in 2018 when he had similar symptoms of DOE and found to have mild, nonobstructive CAD with normal filling pressures in the heart. He will need to follow-up with his PCP for COPD management. One episode of chest heaviness might be secondary to SOB but I do not think this is ischemic in etiology. Will also obtain 2D echocardiogram to rule out diastolic dysfunction but patient does not have to wait until echocardiogram is reported. For time being, he will take Lasix  20 mg daily as needed and assess for any improvement in symptoms. Otherwise,  he is compensated on examination and has no loud murmur. He is at a moderate risk for any perioperative cardiac complications. No further cardiac testing like stress test is indicated prior to proceeding with the planned procedure. He did subsequently undergo C3-5 ACDF  10/13/22 without complication.   Follows with pulmonologist Dr. Shellia for  history of OSA on CPAP and DOE likely secondary to emphysema with possible COPD, obesity, and deconditioning.  He is a former smoker with 44-pack-year history.  PFT 01/13/23:  FEV1 2.00 (67%), FEV1% 65, TLC 5.94 (95%), RV 2.93 (139%), DLCO 72%. Maintained on Stiolto and PRN albuterol . Last seen 06/01/23, stable at that time, no changes to management.   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 Maintained on Allegra , Pepcid , hydroxyzine , Singulair , Claritin , Xolair .  IDDM2, uncontrolled. Previously known to be prediabetic with recent progression to frank diabetes. Seen by PCP 07/08/23. A1c at that time 14.3 with BG 601. His metformin  was increased and he was stared on lantus  and ozempic . He was seen in followup on 12/5. Per note, blood sugar trending down, but still readings over 200. He was continued on metformin  and Ozempic . Lantus  was decreased to 20 units nightly due to symptoms of clamminess at night.  On preop phone call pt reported recent fasting BG 160-170.  LD Ozempic  08/25/23.   History of difficult intubation secondary to reduced neck mobility.    Will need DOS labs and eval.    EKG 09/26/2022: Sinus bradycardia.  Rate 57.  Incomplete right bundle branch block.   TTE 03/18/23: 1. Left ventricular ejection fraction, by estimation, is 60 to 65%. The  left ventricle has normal function. The left ventricle has no regional  wall motion abnormalities. Left ventricular diastolic parameters are  consistent with Grade I diastolic  dysfunction (impaired relaxation).   2. Right ventricular systolic function is normal. The right ventricular  size is normal.   3. The mitral valve is normal in structure. No evidence of mitral valve  regurgitation. No evidence of mitral stenosis.   4. The aortic valve was not well visualized. Aortic valve regurgitation  is not visualized. No aortic stenosis is present.   5. Aortic dilatation noted. There is borderline dilatation of the  aortic  root, measuring 38 mm.   6. The inferior vena cava is normal in size with greater than 50%  respiratory variability, suggesting right atrial pressure of 3 mmHg.   Cath 09/04/16:            The left ventricular systolic function is normal.            LV end diastolic pressure is normal.            The left ventricular ejection fraction is 55-65% by visual estimate.            LV end diastolic pressure is normal.   1. Diffuse mild nonobstructive disease 2. Normal LV function 3. Normal right heart and LV filling pressures 4. Normal cardiac output.    Plan: Medical therapy.  )         Anesthesia Quick Evaluation

## 2024-07-18 NOTE — H&P (Signed)
 Primary Care Physician:  Bevely Doffing, FNP Primary Gastroenterologist:  Dr. Cindie  Pre-Procedure History & Physical: HPI:  Christopher [name] is a 68 y.o. male is here for an EGD to be performed for abdominal pain, GERD  Past Medical History:  Diagnosis Date   Anxiety    Arthritis    Asthma    BPH (benign prostatic hyperplasia)    Complication of anesthesia    pt had a hard time being able to move after spinal anesthesia , 3-4 hours   COPD (chronic obstructive pulmonary disease) (HCC)    Depression    Diabetes mellitus without complication (HCC)    GERD (gastroesophageal reflux disease)    Gout    no meds   Headache(784.0)    otc meds prn   Heart murmur    dx as a child, no problems as an adult   History of COVID-19 2024   History of hiatal hernia    Hyperlipidemia    IBS (irritable bowel syndrome)    PONV (postoperative nausea and vomiting)    Sleep apnea    uses CIPAP machine at night   Wears partial dentures    bottom partial    Past Surgical History:  Procedure Laterality Date   ANTERIOR CERVICAL DECOMP/DISCECTOMY FUSION N/A 10/13/2022   Procedure: ANTERIOR CERVICAL DISECTOMY FUSION - CERVICAL THREE-CERVICAL FOUR - CERVICAL FOUR-CERVICAL FIVE REMOVAL OF HARDWARE CERVICAL FIVE-CERVICAL SIX;  Surgeon: [name], MD;  Location: [facility];  Service: Neurosurgery;  Laterality: N/A;   APPENDECTOMY     BACK SURGERY  2002   neck and back fusion   BIOPSY  12/27/2015   Procedure: BIOPSY;  Surgeon: Claudis RAYMOND Rivet, MD;  Location: [facility];  Service: Endoscopy;;  Fundus biopsies and duodenal biopsies   BIOPSY  02/10/2020   Procedure: BIOPSY;  Surgeon: Rivet Claudis RAYMOND, MD;  Location: [facility];  Service: Endoscopy;;  antral   CARDIAC CATHETERIZATION     CARDIAC CATHETERIZATION N/A 09/04/2016   Procedure: Right/Left Heart Cath and Coronary Angiography;  Surgeon: Peter M Jordan, MD;  Location: [facility];  Service: Cardiovascular;  Laterality: N/A;    CHOLECYSTECTOMY     CHONDROPLASTY  08/15/2011   Procedure: CHONDROPLASTY;  Surgeon: Taft Minerva, MD;  Location: [facility];  Service: Orthopedics;  Laterality: Left;   COLONOSCOPY  06/27/2011   Procedure: COLONOSCOPY;  Surgeon: Claudis RAYMOND Rivet, MD;  [facility]: [facility];  Service: Endoscopy;  Laterality: N/A;  9:00 / Pt to be here at 9am for 10:45 procedure, benign polyps removed   COLONOSCOPY N/A 10/05/2014   Procedure: COLONOSCOPY;  Surgeon: Claudis RAYMOND Rivet, MD;  [facility]: [facility];  Service: Endoscopy;  Laterality: N/A;  930   COLONOSCOPY N/A 02/11/2018   Procedure: COLONOSCOPY;  Surgeon: Rivet Claudis RAYMOND, MD;  [facility];  Service: Endoscopy;  Laterality: N/A;  830   COLONOSCOPY N/A 12/02/2023   Procedure: COLONOSCOPY;  Surgeon: Cindie Carlin POUR, DO;  [facility]: [facility];  Service: Endoscopy;  Laterality: N/A;  845am, asa 3   ESOPHAGOGASTRODUODENOSCOPY N/A 12/27/2015   Procedure: ESOPHAGOGASTRODUODENOSCOPY (EGD);  Surgeon: Claudis RAYMOND Rivet, MD;  [facility];  Service: Endoscopy;  Laterality: N/A;  3:00   ESOPHAGOGASTRODUODENOSCOPY (EGD) WITH PROPOFOL  N/A 02/10/2020   Procedure: ESOPHAGOGASTRODUODENOSCOPY (EGD) WITH PROPOFOL ;  Surgeon: Rivet Claudis RAYMOND, MD;  [facility];  Service: Endoscopy;  Laterality: N/A;  155   HERNIA REPAIR  1998   umbilical hernia   JOINT  REPLACEMENT  2023   knee and shoulder-bilateral knees and left shoulder   KNEE ARTHROSCOPY     left knee   KNEE ARTHROSCOPY     right knee    LUMBAR LAMINECTOMY/DECOMPRESSION MICRODISCECTOMY  09/14/2012   Procedure: LUMBAR LAMINECTOMY/DECOMPRESSION MICRODISCECTOMY 1 LEVEL;  Surgeon: Catalina CHRISTELLA Stains, MD;  Location: [facility];  Service: Neurosurgery;  Laterality: Right;  Right Lumbar three-four Diskectomy   neck fusion  2005   NECK SURGERY     PATELLA-FEMORAL ARTHROPLASTY Right 02/18/2022   Procedure: RIGHT KNEE PATELLA REPLACEMENT, CEMENTED;  Surgeon: Addie Cordella Hamilton, MD;   Location: [facility];  Service: Orthopedics;  Laterality: Right;   POLYPECTOMY  02/11/2018   Procedure: POLYPECTOMY;  Surgeon: Golda Claudis PENNER, MD;  [facility];  Service: Endoscopy;;  colon   POLYPECTOMY  12/02/2023   Procedure: POLYPECTOMY;  Surgeon: Cindie Carlin POUR, DO;  [facility];  Service: Endoscopy;;   POSTERIOR CERVICAL FUSION/FORAMINOTOMY N/A 09/04/2023   Procedure: Posterior Cervical Fusion, Cervical Five-Cervical Four and Cervical Three-Cervical Three-Cervical Four;  Surgeon: [name], MD;  Location[facility] OR;  Service: Neurosurgery;  Laterality: N/A;   REVISION TOTAL SHOULDER TO REVERSE TOTAL SHOULDER Left 12/16/2018   Procedure: REVISION TOTAL SHOULDER TO REVERSE TOTAL SHOULDER;  Surgeon: Addie Cordella Hamilton, MD;  Location: [facility] OR;  Service: Orthopedics;  Laterality: Left;   SHOULDER ARTHROSCOPY WITH BICEPSTENOTOMY Left 09/23/2013   Procedure: SHOULDER ARTHROSCOPY WITH BICEPSTENOTOMY AND EXTENSIVE DEBRIDEMENT;  Surgeon: Taft FORBES Minerva, MD;  Location: [facility];  Service: Orthopedics;  Laterality: Left;   SHOULDER ARTHROSCOPY WITH ROTATOR CUFF REPAIR Left 05/05/2014   Procedure: SHOULDER ARTHROSCOPY LIMITED DEBRIDEMENT;  Surgeon: Taft FORBES Minerva, MD;  Location: [facility];  Service: Orthopedics;  Laterality: Left;   SHOULDER OPEN ROTATOR CUFF REPAIR Left 05/05/2014   Procedure: ROTATOR CUFF REPAIR SHOULDER OPEN;  Surgeon: Taft FORBES Minerva, MD;  Location: [facility];  Service: Orthopedics;  Laterality: Left;   SHOULDER OPEN ROTATOR CUFF REPAIR Left 12/16/2018   Procedure: LEFT SHOULDER POSSIBLE SUBSCAPULARIS REPAIR VS. REVISION TO REVERSE TOTAL SHOULDER REPLACEMENT;  Surgeon: Addie Cordella Hamilton, MD;  Location: [facility] OR;  Service: Orthopedics;  Laterality: Left;   SHOULDER SURGERY Right    Open Mumford procedure   SPINAL FUSION     x2, 2003 and 2007   TOTAL KNEE ARTHROPLASTY Left 06/16/2017   Procedure: LEFT TOTAL KNEE ARTHROPLASTY;  Surgeon: Minerva Taft FORBES, MD;   Location: [facility];  Service: Orthopedics;  Laterality: Left;   TOTAL KNEE ARTHROPLASTY Right 05/14/2021   Procedure: TOTAL KNEE ARTHROPLASTY;  Surgeon: Minerva Taft FORBES, MD;  Location: [facility];  Service: Orthopedics;  Laterality: Right;   TOTAL SHOULDER ARTHROPLASTY Left 08/19/2018   Procedure: left shoulder replacement;  Surgeon: Addie Cordella Hamilton, MD;  Location: [facility] OR;  Service: Orthopedics;  Laterality: Left;    Prior to Admission medications   Medication Sig Start Date End Date Taking? Authorizing Provider  albuterol  (VENTOLIN  HFA) 108 (90 Base) MCG/ACT inhaler inhale 2 puffs into lungs every 6 hours as needed for wheezing or shortness of breath 06/03/23  Yes Melvenia Manus FORBES, MD  amLODipine (NORVASC) 5 MG tablet Take 1 tablet (5 mg total) by mouth daily. 06/07/24 06/02/25 Yes Mallipeddi, Vishnu P, MD  aspirin  EC 81 MG tablet Take 1 tablet (81 mg total) by mouth daily. Swallow whole. 06/23/24  Yes Mallipeddi, Vishnu P, MD  Blood Glucose Monitoring Suppl (ONE TOUCH ULTRA 2) w/Device KIT USE TO TEST BLOOD SUGAR THREE TIMES  DAILY. 04/21/24  Yes Bevely Doffing, FNP  Blood Glucose Monitoring Suppl DEVI 1 each by Does not apply route in the morning, at noon, and at bedtime. May substitute to any manufacturer covered by patient's insurance. 07/08/23  Yes Melvenia Manus BRAVO, MD  cyclobenzaprine  (FLEXERIL ) 10 MG tablet Take 1 tablet (10 mg total) by mouth 3 (three) times daily as needed for muscle spasms. 09/05/23  Yes Colon Shove, MD  famotidine  (PEPCID ) 40 MG tablet Take 1 tablet (40 mg total) by mouth 2 (two) times daily as needed (hives). 04/25/24  Yes Tobie Arleta SQUIBB, MD  fexofenadine  (ALLEGRA  ALLERGY ) 180 MG tablet Take 2 tablets (360 mg total) by mouth 2 (two) times daily as needed (hives). 10/26/23  Yes Tobie Arleta SQUIBB, MD  GLOBAL EASE INJECT PEN NEEDLES 31G X 5 MM MISC Inject 1 Syringe into the skin daily. 12/25/23  Yes Melvenia Manus BRAVO, MD  glucose blood (ONETOUCH ULTRA TEST) test strip Use daily to  check blood sugars. 04/26/24  Yes Bevely Doffing, FNP  hydrOXYzine  (VISTARIL ) 25 MG capsule TAKE 1-2 CAPSULES BY MOUTH AT BEDTIME AS NEEDED. 12/28/23  Yes Tobie Arleta SQUIBB, MD  Lancets [facility] DELICA PLUS [place]) MISC 1 each by Does not apply route in the morning, at noon, and at bedtime. 09/23/23  Yes Melvenia Manus BRAVO, MD  LANTUS  SOLOSTAR 100 UNIT/ML Solostar Pen Inject 20 Units into the skin at bedtime. 03/05/24  Yes [provider]  meloxicam  (MOBIC ) 7.5 MG tablet Take 7.5 mg by mouth 2 (two) times daily.   Yes [provider]  Menthol , Topical Analgesic, (FREEZE IT FAST PAIN RELIEF EX) Apply 1 Application topically daily. 10%   Yes [provider]  montelukast  (SINGULAIR ) 10 MG tablet Take 1 tablet (10 mg total) by mouth at bedtime. 10/26/23  Yes Tobie Arleta SQUIBB, MD  omalizumab  (XOLAIR ) 300 MG/2  ML prefilled syringe Inject 300 mg into the skin every 28 (twenty-eight) days. 12/07/23  Yes Ambs, Arlean HERO, FNP  ondansetron  (ZOFRAN ) 4 MG tablet Take 1 tablet (4 mg total) by mouth every 8 (eight) hours as needed for nausea. 06/12/23  Yes Del Orbe Polanco, Hilario, FNP  pantoprazole  (PROTONIX ) 40 MG tablet Take 1 tablet (40 mg total) by mouth 2 (two) times daily before a meal. TAKE (1) TABLET TWICE A DAY BEFORE MEALS. 07/13/24  Yes Shirlean Therisa ORN, NP  rosuvastatin  (CRESTOR ) 40 MG tablet Take 1 tablet (40 mg total) by mouth daily. 01/01/24  Yes Melvenia Manus BRAVO, MD  sertraline  (ZOLOFT ) 50 MG tablet Take 1 tablet (50 mg total) by mouth in the morning. 05/19/24  Yes Bevely Doffing, FNP  Testosterone  Undecanoate (JATENZO ) 237 MG CAPS Take 1 capsule (237 mg total) by mouth in the morning and at bedtime. 11/25/23  Yes McKenzie, Belvie CROME, MD  Tiotropium Bromide-Olodaterol (STIOLTO RESPIMAT ) 2.5-2.5 MCG/ACT AERS Inhale 2 puffs into the lungs daily. 01/29/23  Yes Shellia Oh, MD  EPINEPHrine  0.3 mg/0.3 mL IJ SOAJ injection Inject 0.3 mg into the muscle as needed for anaphylaxis. 10/26/23   Tobie Arleta SQUIBB, MD  nitroGLYCERIN  (NITROSTAT ) 0.4 MG SL tablet PLACE (1) TABLET UNDER TONGUE EVERY 5 MINUTES UP TO (3) DOSES. IF NO RELIEF CALL 911. 12/02/22   Alvan Dorn FALCON, MD    Allergies as of 07/13/2024 - Review Complete 07/13/2024  Allergen Reaction Noted   Celebrex [celecoxib] Itching and Swelling 09/02/2012   Codeine Nausea And Vomiting and Other (See Comments) 01/19/2023   Cortisone Swelling 09/14/2012   Doxycycline Swelling  and Other (See Comments)    Medrol  [methylprednisolone ] Hives and Swelling 10/26/2022   Prednisone  Swelling    Relafen [nabumetone] Swelling 08/04/2011   Aspirin  Other (See Comments) 07/11/2021   Oxycodone -acetaminophen  Itching and Other (See Comments)    Raloxifene Rash 01/19/2023   Rofecoxib Rash 01/19/2023    Family History  Problem Relation Age of Onset   Diabetes Mother    Alzheimer's disease Father    Diabetes Father    Diabetes Other    Lung disease Other    Arthritis Other    Anesthesia problems Neg Hx    Hypotension Neg Hx    Malignant hyperthermia Neg Hx    Pseudochol deficiency Neg Hx    Sleep apnea Neg Hx     Social History   Socioeconomic History   Marital status: Married    Spouse name: Not on file   Number of children: Not on file   Years of education: Not on file   Highest education level: Not on file  Occupational History   Occupation: therapist, music    Employer: DISABLED  Tobacco Use   Smoking status: Every Day    Current packs/day: 1.00    Average packs/day: 1 pack/day for 45.9 years (45.9 ttl pk-yrs)    Types: Cigarettes    Start date: 08/25/1978    Passive exposure: Current   Smokeless tobacco: Never   Tobacco comments:    smokes a pack a day. since age 73  Vaping Use   Vaping status: Never Used  Substance and Sexual Activity   Alcohol use: Never   Drug use: Never   Sexual activity: Yes  Other Topics Concern   Not on file  Social History Narrative   Lives at home with wife   Right handed   Caffeine: 2  cups of coffee, 2 cans of soda, 4-5 cups of tea daily.   Social Drivers of Corporate Investment Banker Strain: Low Risk  (10/20/2023)   Overall Financial Resource Strain (CARDIA)    Difficulty of Paying Living Expenses: Not hard at all  Food Insecurity: No Food Insecurity (10/20/2023)   Hunger Vital Sign    Worried About Running Out of Food in the Last Year: Never true    Ran Out of Food in the Last Year: Never true  Transportation Needs: No Transportation Needs (10/20/2023)   PRAPARE - Administrator, Civil Service (Medical): No    Lack of Transportation (Non-Medical): No  Physical Activity: Inactive (10/20/2023)   Exercise Vital Sign    Days of Exercise per Week: 0 days    Minutes of Exercise per Session: 0 min  Stress: No Stress Concern Present (10/20/2023)   [employer] of Occupational Health - Occupational Stress Questionnaire    Feeling of Stress : Not at all  Social Connections: Socially Isolated (10/20/2023)   Social Connection and Isolation Panel    Frequency of Communication with Friends and Family: Once a week    Frequency of Social Gatherings with Friends and Family: Once a week    Attends Religious Services: Never    Database Administrator or Organizations: No    Attends Banker Meetings: Never    Marital Status: Married  Catering Manager Violence: Not At Risk (10/20/2023)   Humiliation, Afraid, Rape, and Kick questionnaire    Fear of Current or Ex-Partner: No    Emotionally Abused: No    Physically Abused: No    Sexually Abused: No  Review of Systems: General: Negative for fever, chills, fatigue, weakness. Eyes: Negative for vision changes.  ENT: Negative for hoarseness, difficulty swallowing , nasal congestion. CV: Negative for chest pain, angina, palpitations, dyspnea on exertion, peripheral edema.  Respiratory: Negative for dyspnea at rest, dyspnea on exertion, cough, sputum, wheezing.  GI: See history of present illness. GU:   Negative for dysuria, hematuria, urinary incontinence, urinary frequency, nocturnal urination.  MS: Negative for joint pain, low back pain.  Derm: Negative for rash or itching.  Neuro: Negative for weakness, abnormal sensation, seizure, frequent headaches, memory loss, confusion.  Psych: Negative for anxiety, depression Endo: Negative for unusual weight change.  Heme: Negative for bruising or bleeding. Allergy : Negative for rash or hives.  Physical Exam: Vital signs in last 24 hours:     General:   Alert,  Well-developed, well-nourished, pleasant and cooperative in NAD Head:  Normocephalic and atraumatic. Eyes:  Sclera clear, no icterus.   Conjunctiva pink. Ears:  Normal auditory acuity. Nose:  No deformity, discharge,  or lesions. Msk:  Symmetrical without gross deformities. Normal posture. Extremities:  Without clubbing or edema. Neurologic:  Alert and  oriented x4;  grossly normal neurologically. Skin:  Intact without significant lesions or rashes. Psych:  Alert and cooperative. Normal mood and affect.   Impression/Plan: Christopher [name] is here for an EGD to be performed for abdominal pain, GERD  Risks, benefits, limitations, imponderables and alternatives regarding procedure have been reviewed with the patient. Questions have been answered. All parties agreeable.

## 2024-07-18 NOTE — Transfer of Care (Signed)
 Immediate Anesthesia Transfer of Care Note  Patient: Christopher [name]  Procedure(s) Performed: EGD (ESOPHAGOGASTRODUODENOSCOPY)  Patient Location: PACU  Anesthesia Type:General  Level of Consciousness: awake  Airway & Oxygen Therapy: Patient Spontanous Breathing  Post-op Assessment: Report given to RN  Post vital signs: Reviewed  Last Vitals:  Vitals Value Taken Time  BP    Temp    Pulse    Resp    SpO2      Last Pain:  Vitals:   07/18/24 1335  TempSrc:   PainSc: 0-No pain      Patients Stated Pain Goal: 8 (07/18/24 1329)  Complications: No notable events documented.

## 2024-07-18 NOTE — Discharge Instructions (Addendum)
 EGD Discharge instructions Please read the instructions outlined below and refer to this sheet in the next few weeks. These discharge instructions provide you with general information on caring for yourself after you leave the hospital. Your doctor may also give you specific instructions. While your treatment has been planned according to the most current medical practices available, unavoidable complications occasionally occur. If you have any problems or questions after discharge, please call your doctor. ACTIVITY You may resume your regular activity but move at a slower pace for the next 24 hours.  Take frequent rest periods for the next 24 hours.  Walking will help expel (get rid of) the air and reduce the bloated feeling in your abdomen.  No driving for 24 hours (because of the anesthesia (medicine) used during the test).  You may shower.  Do not sign any important legal documents or operate any machinery for 24 hours (because of the anesthesia used during the test).  NUTRITION Drink plenty of fluids.  You may resume your normal diet.  Begin with a light meal and progress to your normal diet.  Avoid alcoholic beverages for 24 hours or as instructed by your caregiver.  MEDICATIONS You may resume your normal medications unless your caregiver tells you otherwise.  WHAT YOU CAN EXPECT TODAY You may experience abdominal discomfort such as a feeling of fullness or "gas" pains.  FOLLOW-UP Your doctor will discuss the results of your test with you.  SEEK IMMEDIATE MEDICAL ATTENTION IF ANY OF THE FOLLOWING OCCUR: Excessive nausea (feeling sick to your stomach) and/or vomiting.  Severe abdominal pain and distention (swelling).  Trouble swallowing.  Temperature over 101 F (37.8 C).  Rectal bleeding or vomiting of blood.   Your EGD revealed moderate amount inflammation in your stomach with multiple small ulcers.  I took biopsies of this to rule out infection with a bacteria called H. pylori.   You also had inflammation in your small bowel as well.  Esophagus appeared normal.  Await pathology results, my office will contact you.  Continue on pantoprazole  twice daily.  I will send in Carafate to take up to 4 times a day as needed for breakthrough symptoms.  Follow-up in GI office in 8 weeks.    I hope you have a great rest of your week!  Carlin POUR. Cindie, D.O. Gastroenterology and Hepatology [facility]

## 2024-07-18 NOTE — Anesthesia Postprocedure Evaluation (Signed)
 Anesthesia Post Note  Patient: Christopher [name]  Procedure(s) Performed: EGD (ESOPHAGOGASTRODUODENOSCOPY)  Patient location during evaluation: Phase II Anesthesia Type: General Level of consciousness: awake and alert Pain management: pain level controlled Vital Signs Assessment: post-procedure vital signs reviewed and stable Respiratory status: spontaneous breathing, nonlabored ventilation and respiratory function stable Cardiovascular status: stable Anesthetic complications: no   There were no known notable events for this encounter.   Last Vitals:  Vitals:   07/18/24 1333 07/18/24 1357  BP: (!) 140/69 (!) 110/49  Pulse:  (!) 55  Resp:  (!) 22  Temp:  36.7 C  SpO2:  98%    Last Pain:  Vitals:   07/18/24 1357  TempSrc: Oral  PainSc: 0-No pain                 Abiha Lukehart L Khyan Oats

## 2024-07-20 LAB — SURGICAL PATHOLOGY

## 2024-07-22 ENCOUNTER — Encounter (HOSPITAL_COMMUNITY): Payer: Self-pay | Admitting: Internal Medicine

## 2024-07-22 ENCOUNTER — Other Ambulatory Visit: Payer: Self-pay

## 2024-07-26 ENCOUNTER — Other Ambulatory Visit: Payer: Self-pay | Admitting: *Deleted

## 2024-07-26 ENCOUNTER — Telehealth: Payer: Self-pay

## 2024-07-26 ENCOUNTER — Ambulatory Visit: Payer: Self-pay | Admitting: Gastroenterology

## 2024-07-26 DIAGNOSIS — R1084 Generalized abdominal pain: Secondary | ICD-10-CM

## 2024-07-26 NOTE — Addendum Note (Signed)
 Addended by: GAYLENE MADELIN CROME on: 07/26/2024 03:26 PM   Modules accepted: Orders

## 2024-07-26 NOTE — Telephone Encounter (Signed)
 [name] Alejandra (on dpr) informed that CTA is scheduled for 07/29/24, arrive at 10:45 am to check in at [facility].

## 2024-07-26 NOTE — Addendum Note (Signed)
 Addended by: GAYLENE MADELIN CROME on: 07/26/2024 03:31 PM   Modules accepted: Orders

## 2024-07-26 NOTE — Telephone Encounter (Signed)
 [name] called and advises that the pt is still having issues with cramping a lot and the Carafate  is not working. Pt was also still drinking Pepto Bismol a lot but stated stopped a few days ago but no better. Pt's CT and EGD is back. Please advise

## 2024-07-26 NOTE — Telephone Encounter (Signed)
  Labs and CT reviewed:  CBC normal, no IDA, lipase normal, LFTs normal, alpha gal negative.   EGD  with small hiatal hernia, diffuse moderate inflammation and shallow ulcerations in entire stomach s/p biopsy, localized mild inflammation with edema and erythema in duodenal bulb. path: reactive and erosive gastritis, negative H.pylori, negative  intestinal metaplasia   CT abd/pelvis with contrast noted no acute findings, and it showed hepatic steatosis. No CBD dilation, pancreas normal.     After review of labs, endoscopy findings, and his continued postprandial abdominal pain that is significant, I recommend CT angiogram abd/pelvis with contrast ASAP. His stomach showed ulcerations, inflamed, and could be due to the NSAIDs. However, we need to rule out any chronic mesenteric ischemia going on in light of his symptoms. I also note he HAS had weight loss documented.    Dena: please let patient know.  Mindy/Tammy: we need CTA abd/pelvis with and without contrast, evalute for chronic mesenteric ischemia.

## 2024-07-26 NOTE — Telephone Encounter (Signed)
 Phoned and spoke with the pt's spouse and advised of the labs, procedure reports, pathology results and ASAP recommendations. Pt and spouse expressed understanding and wants to move forward with CTA

## 2024-07-27 ENCOUNTER — Telehealth: Payer: Self-pay

## 2024-07-27 NOTE — Telephone Encounter (Signed)
 Spoke to pt's wife and she said she was already informed to what she needed to know.

## 2024-07-27 NOTE — Telephone Encounter (Signed)
 [name] phoned stating the pt stated he has had a CT just a few weeks ago. I advised this is not the same procedure as before this CT angio watches the blood flow to these areas to make sure that it doesn't have a problem with that. [name] wants to speak to you regarding his instructions before procedure.

## 2024-07-29 ENCOUNTER — Ambulatory Visit (HOSPITAL_COMMUNITY)
Admission: RE | Admit: 2024-07-29 | Discharge: 2024-07-29 | Disposition: A | Discharge status: Home / Self Care | Source: Ambulatory Visit | Attending: Gastroenterology | Admitting: Gastroenterology

## 2024-07-29 DIAGNOSIS — R1084 Generalized abdominal pain: Secondary | ICD-10-CM | POA: Insufficient documentation

## 2024-07-29 MED ORDER — IOHEXOL 350 MG/ML SOLN
100.0000 mL | Freq: Once | INTRAVENOUS | Status: AC | PRN
  Administered 2024-07-29: 100 mL via INTRAVENOUS

## 2024-08-07 ENCOUNTER — Ambulatory Visit: Payer: Self-pay | Admitting: Gastroenterology

## 2024-08-08 ENCOUNTER — Ambulatory Visit

## 2024-08-08 DIAGNOSIS — L501 Idiopathic urticaria: Secondary | ICD-10-CM

## 2024-08-09 ENCOUNTER — Ambulatory Visit

## 2024-08-10 ENCOUNTER — Other Ambulatory Visit: Payer: Self-pay

## 2024-08-10 ENCOUNTER — Encounter: Payer: Self-pay | Admitting: Gastroenterology

## 2024-08-10 ENCOUNTER — Ambulatory Visit: Admitting: Gastroenterology

## 2024-08-10 VITALS — BP 145/80 | HR 60 | Temp 97.8°F | Ht 66.0 in | Wt 224.6 lb

## 2024-08-10 DIAGNOSIS — R1030 Lower abdominal pain, unspecified: Secondary | ICD-10-CM

## 2024-08-10 DIAGNOSIS — R140 Abdominal distension (gaseous): Secondary | ICD-10-CM | POA: Insufficient documentation

## 2024-08-10 DIAGNOSIS — R110 Nausea: Secondary | ICD-10-CM | POA: Diagnosis not present

## 2024-08-10 DIAGNOSIS — E1165 Type 2 diabetes mellitus with hyperglycemia: Secondary | ICD-10-CM

## 2024-08-10 DIAGNOSIS — R1084 Generalized abdominal pain: Secondary | ICD-10-CM

## 2024-08-10 MED ORDER — LANTUS SOLOSTAR 100 UNIT/ML ~~LOC~~ SOPN: 20.0000 [IU] | PEN_INJECTOR | Freq: Every day | SUBCUTANEOUS | 3 refills | Status: AC

## 2024-08-10 MED ORDER — RIFAXIMIN 550 MG PO TABS: 550.0000 mg | ORAL_TABLET | Freq: Three times a day (TID) | ORAL | 0 refills | Status: AC

## 2024-08-10 NOTE — Progress Notes (Signed)
 "   Gastroenterology Office Note     Primary Care Physician:  Bevely Doffing, FNP  Primary Gastroenterologist: Cindie   Chief Complaint   Chief Complaint  Patient presents with   Follow-up    Patient here today for a post procedural visit. Patient had an EGD done with Dr. Cindie on 07/18/2024, and a CT angio abd on 07/29/2024. Patient still having abdominal pain and states he is taking pantoprazole  40 mg bid, and famotidine  40 mg once per day, Carafate  1 g QID.      History of Present Illness   Christopher [name] is a 68 y.o. male presenting today with a history of chronic GERD, IBS mixed, colon polyps, worsening lower abdominal pain, nausea. Last seen in Nov 2025 regarding worsening pain, bloating.  Thus far: Alpha gal panel normal, CBC normal, lipase normal, LFTs normal CTA Nov 2025: possible small renal stone left side, bilateral renal cysts. Patent vasculature.   EGD Nov 2025: small hiatal hernia, gastritis s/p biopsy, duodenitis. Path: reactive and erosive gastritis, negative H.pylori.   Abdominal pain and cramping diffusely, daily, notes before and after eating. Pepto bismol helps. 3-4 BMs per day. One week is dark/black, then will be soft and brown. Long and hard dark black. Had hemorrhoid flare recently. Small amount of blood recently with hemorrhoids. One day emptying, one day not. Doesn't feel bound up this week. Feels some improvement after BM. Some bloating.   Mesh for hernia about 10 years ago and has had abdominal pain since that time.   Nausea when out working and then stomach will start cramping.   CTA Nov 2025: possible small renal stone left side, bilateral renal cysts. Patent vasculature.   EGD Nov 2025: small hiatal hernia, gastritis s/p biopsy, duodenitis. Path: reactive and erosive gastritis, negative H.pylori.   EGD 2021: normal esophagus, gastritis s/p biopsy, antral GIM, negative H.pylori, normal duodenum.    Colonoscopy April 2025: internal hemorrhoids,  two 7-8 mm polyps in cecum, 12 sessile polyps in transverse that were 4-8 mm in size, 1 year surveillance. Path with tubular adenomas.      2019 with 5 tubular adenomas removed. 2 prior colonoscopies before this both with polyps removed as well.   Past Medical History:  Diagnosis Date   Anxiety    Arthritis    Asthma    BPH (benign prostatic hyperplasia)    Complication of anesthesia    pt had a hard time being able to move after spinal anesthesia , 3-4 hours   COPD (chronic obstructive pulmonary disease) (HCC)    Depression    Diabetes mellitus without complication (HCC)    GERD (gastroesophageal reflux disease)    Gout    no meds   Headache(784.0)    otc meds prn   Heart murmur    dx as a child, no problems as an adult   History of COVID-19 2024   History of hiatal hernia    Hyperlipidemia    IBS (irritable bowel syndrome)    PONV (postoperative nausea and vomiting)    Sleep apnea    uses CIPAP machine at night   Wears partial dentures    bottom partial    Past Surgical History:  Procedure Laterality Date   ANTERIOR CERVICAL DECOMP/DISCECTOMY FUSION N/A 10/13/2022   Procedure: ANTERIOR CERVICAL DISECTOMY FUSION - CERVICAL THREE-CERVICAL FOUR - CERVICAL FOUR-CERVICAL FIVE REMOVAL OF HARDWARE CERVICAL FIVE-CERVICAL SIX;  Surgeon: [name], MD;  Location: [facility];  Service: Neurosurgery;  Laterality: N/A;  APPENDECTOMY     BACK SURGERY  2002   neck and back fusion   BIOPSY  12/27/2015   Procedure: BIOPSY;  Surgeon: Claudis RAYMOND Rivet, MD;  Location: [facility];  Service: Endoscopy;;  Fundus biopsies and duodenal biopsies   BIOPSY  02/10/2020   Procedure: BIOPSY;  Surgeon: Rivet Claudis RAYMOND, MD;  [facility]: [facility];  Service: Endoscopy;;  antral   CARDIAC CATHETERIZATION     CARDIAC CATHETERIZATION N/A 09/04/2016   Procedure: Right/Left Heart Cath and Coronary Angiography;  Surgeon: Peter M Jordan, MD;  Location: [facility];  Service: Cardiovascular;   Laterality: N/A;   CHOLECYSTECTOMY     CHONDROPLASTY  08/15/2011   Procedure: CHONDROPLASTY;  Surgeon: Taft Minerva, MD;  Location: [facility];  Service: Orthopedics;  Laterality: Left;   COLONOSCOPY  06/27/2011   Procedure: COLONOSCOPY;  Surgeon: Claudis RAYMOND Rivet, MD;  [facility];  Service: Endoscopy;  Laterality: N/A;  9:00 / Pt to be here at 9am for 10:45 procedure, benign polyps removed   COLONOSCOPY N/A 10/05/2014   Procedure: COLONOSCOPY;  Surgeon: Claudis RAYMOND Rivet, MD;  [facility];  Service: Endoscopy;  Laterality: N/A;  930   COLONOSCOPY N/A 02/11/2018   Procedure: COLONOSCOPY;  Surgeon: Rivet Claudis RAYMOND, MD;  [facility];  Service: Endoscopy;  Laterality: N/A;  830   COLONOSCOPY N/A 12/02/2023   Procedure: COLONOSCOPY;  Surgeon: Cindie Carlin POUR, DO;  [facility];  Service: Endoscopy;  Laterality: N/A;  845am, asa 3   ESOPHAGOGASTRODUODENOSCOPY N/A 12/27/2015   Procedure: ESOPHAGOGASTRODUODENOSCOPY (EGD);  Surgeon: Claudis RAYMOND Rivet, MD;  [facility];  Service: Endoscopy;  Laterality: N/A;  3:00   ESOPHAGOGASTRODUODENOSCOPY N/A 07/18/2024   Procedure: EGD (ESOPHAGOGASTRODUODENOSCOPY);  Surgeon: Cindie Carlin POUR, DO;  [facility];  Service: Endoscopy;  Laterality: N/A;  2:00PM;ASA 3   ESOPHAGOGASTRODUODENOSCOPY (EGD) WITH PROPOFOL  N/A 02/10/2020   Procedure: ESOPHAGOGASTRODUODENOSCOPY (EGD) WITH PROPOFOL ;  Surgeon: Rivet Claudis RAYMOND, MD;  [facility];  Service: Endoscopy;  Laterality: N/A;  155   HERNIA REPAIR  1998   umbilical hernia   JOINT REPLACEMENT  2023   knee and shoulder-bilateral knees and left shoulder   KNEE ARTHROSCOPY     left knee   KNEE ARTHROSCOPY     right knee    LUMBAR LAMINECTOMY/DECOMPRESSION MICRODISCECTOMY  09/14/2012   Procedure: LUMBAR LAMINECTOMY/DECOMPRESSION MICRODISCECTOMY 1 LEVEL;  Surgeon: Catalina CHRISTELLA Stains, MD;  Location: [facility];  Service: Neurosurgery;   Laterality: Right;  Right Lumbar three-four Diskectomy   neck fusion  2005   NECK SURGERY     PATELLA-FEMORAL ARTHROPLASTY Right 02/18/2022   Procedure: RIGHT KNEE PATELLA REPLACEMENT, CEMENTED;  Surgeon: Addie Cordella Hamilton, MD;  Location: [facility];  Service: Orthopedics;  Laterality: Right;   POLYPECTOMY  02/11/2018   Procedure: POLYPECTOMY;  Surgeon: Rivet Claudis RAYMOND, MD;  [facility];  Service: Endoscopy;;  colon   POLYPECTOMY  12/02/2023   Procedure: POLYPECTOMY;  Surgeon: Cindie Carlin POUR, DO;  [facility];  Service: Endoscopy;;   POSTERIOR CERVICAL FUSION/FORAMINOTOMY N/A 09/04/2023   Procedure: Posterior Cervical Fusion, Cervical Five-Cervical Four and Cervical Three-Cervical Three-Cervical Four;  Surgeon: [name], MD;  Location: [facility] OR;  Service: Neurosurgery;  Laterality: N/A;   REVISION TOTAL SHOULDER TO REVERSE TOTAL SHOULDER Left 12/16/2018   Procedure: REVISION TOTAL SHOULDER TO REVERSE TOTAL SHOULDER;  Surgeon: Addie Cordella Hamilton, MD;  Location: [facility];  Service: Orthopedics;  Laterality: Left;   SHOULDER ARTHROSCOPY WITH BICEPSTENOTOMY Left 09/23/2013   Procedure: SHOULDER ARTHROSCOPY WITH BICEPSTENOTOMY AND EXTENSIVE DEBRIDEMENT;  Surgeon: Taft FORBES Minerva, MD;  Location: [facility];  Service: Orthopedics;  Laterality: Left;   SHOULDER ARTHROSCOPY WITH ROTATOR CUFF REPAIR Left 05/05/2014   Procedure: SHOULDER ARTHROSCOPY LIMITED DEBRIDEMENT;  Surgeon: Taft FORBES Minerva, MD;  Location: [facility];  Service: Orthopedics;  Laterality: Left;   SHOULDER OPEN ROTATOR CUFF REPAIR Left 05/05/2014   Procedure: ROTATOR CUFF REPAIR SHOULDER OPEN;  Surgeon: Taft FORBES Minerva, MD;  Location: [facility];  Service: Orthopedics;  Laterality: Left;   SHOULDER OPEN ROTATOR CUFF REPAIR Left 12/16/2018   Procedure: LEFT SHOULDER POSSIBLE SUBSCAPULARIS REPAIR VS. REVISION TO REVERSE TOTAL SHOULDER REPLACEMENT;  Surgeon: Addie Cordella Hamilton, MD;  Location: [facility] OR;  Service: Orthopedics;   Laterality: Left;   SHOULDER SURGERY Right    Open Mumford procedure   SPINAL FUSION     x2, 2003 and 2007   TOTAL KNEE ARTHROPLASTY Left 06/16/2017   Procedure: LEFT TOTAL KNEE ARTHROPLASTY;  Surgeon: Minerva Taft FORBES, MD;  Location: [facility];  Service: Orthopedics;  Laterality: Left;   TOTAL KNEE ARTHROPLASTY Right 05/14/2021   Procedure: TOTAL KNEE ARTHROPLASTY;  Surgeon: Minerva Taft FORBES, MD;  Location: [facility];  Service: Orthopedics;  Laterality: Right;   TOTAL SHOULDER ARTHROPLASTY Left 08/19/2018   Procedure: left shoulder replacement;  Surgeon: Addie Cordella Hamilton, MD;  Location: [facility] OR;  Service: Orthopedics;  Laterality: Left;    Current Outpatient Medications  Medication Sig Dispense Refill   albuterol  (VENTOLIN  HFA) 108 (90 Base) MCG/ACT inhaler inhale 2 puffs into lungs every 6 hours as needed for wheezing or shortness of breath 6.7 g 0   amLODipine  (NORVASC ) 5 MG tablet Take 1 tablet (5 mg total) by mouth daily. 90 tablet 3   aspirin  EC 81 MG tablet Take 1 tablet (81 mg total) by mouth daily. Swallow whole.     Blood Glucose Monitoring Suppl (ONE TOUCH ULTRA 2) w/Device KIT USE TO TEST BLOOD SUGAR THREE TIMES DAILY. 1 kit 0   Blood Glucose Monitoring Suppl DEVI 1 each by Does not apply route in the morning, at noon, and at bedtime. May substitute to any manufacturer covered by patient's insurance. 1 each 0   cyclobenzaprine  (FLEXERIL ) 10 MG tablet Take 1 tablet (10 mg total) by mouth 3 (three) times daily as needed for muscle spasms. 30 tablet 3   EPINEPHrine  0.3 mg/0.3 mL IJ SOAJ injection Inject 0.3 mg into the muscle as needed for anaphylaxis. 2 each 1   famotidine  (PEPCID ) 40 MG tablet Take 1 tablet (40 mg total) by mouth 2 (two) times daily as needed (hives). (Patient taking differently: Take 40 mg by mouth daily.) 60 tablet 5   fexofenadine  (ALLEGRA  ALLERGY ) 180 MG tablet Take 2 tablets (360 mg total) by mouth 2 (two) times daily as needed (hives). 60 tablet 5   GLOBAL  EASE INJECT PEN NEEDLES 31G X 5 MM MISC Inject 1 Syringe into the skin daily. 100 each 0   glucose blood (ONETOUCH ULTRA TEST) test strip Use daily to check blood sugars. 100 each 12   Lancets (ONETOUCH DELICA PLUS LANCET33G) MISC 1 each by Does not apply route in the morning, at noon, and at bedtime. 100 each 0   LANTUS  SOLOSTAR 100 UNIT/ML Solostar Pen Inject 20 Units into the skin at bedtime.     meloxicam  (MOBIC ) 7.5 MG tablet Take 7.5 mg by mouth 2 (  two) times daily.     Menthol , Topical Analgesic, (FREEZE IT FAST PAIN RELIEF EX) Apply 1 Application topically daily. 10%     nitroGLYCERIN  (NITROSTAT ) 0.4 MG SL tablet PLACE (1) TABLET UNDER TONGUE EVERY 5 MINUTES UP TO (3) DOSES. IF NO RELIEF CALL 911. 25 tablet 3   omalizumab  (XOLAIR ) 300 MG/2  ML prefilled syringe Inject 300 mg into the skin every 28 (twenty-eight) days. 2 mL 11   ondansetron  (ZOFRAN ) 4 MG tablet Take 1 tablet (4 mg total) by mouth every 8 (eight) hours as needed for nausea. 20 tablet 0   pantoprazole  (PROTONIX ) 40 MG tablet Take 1 tablet (40 mg total) by mouth 2 (two) times daily before a meal. TAKE (1) TABLET TWICE A DAY BEFORE MEALS. 60 tablet 5   rosuvastatin  (CRESTOR ) 40 MG tablet Take 1 tablet (40 mg total) by mouth daily. 90 tablet 3   sertraline  (ZOLOFT ) 50 MG tablet Take 1 tablet (50 mg total) by mouth in the morning. 90 tablet 1   sucralfate  (CARAFATE ) 1 g tablet Take 1 tablet (1 g total) by mouth 4 (four) times daily. 120 tablet 1   Testosterone  Undecanoate (JATENZO ) 237 MG CAPS Take 1 capsule (237 mg total) by mouth in the morning and at bedtime. 60 capsule 5   Tiotropium Bromide-Olodaterol (STIOLTO RESPIMAT ) 2.5-2.5 MCG/ACT AERS Inhale 2 puffs into the lungs daily. 1 each 5   hydrOXYzine  (VISTARIL ) 25 MG capsule TAKE 1-2 CAPSULES BY MOUTH AT BEDTIME AS NEEDED. (Patient not taking: Reported on 08/10/2024) 30 capsule 5   montelukast  (SINGULAIR ) 10 MG tablet Take 1 tablet (10 mg total) by mouth at bedtime. (Patient not  taking: Reported on 08/10/2024) 30 tablet 5   Current Facility-Administered Medications  Medication Dose Route Frequency Provider Last Rate Last Admin   omalizumab  (XOLAIR ) prefilled syringe 300 mg  300 mg Subcutaneous Q28 days Iva Marty Saltness, MD   300 mg at 08/08/24 1057    Allergies as of 08/10/2024 - Review Complete 08/10/2024  Allergen Reaction Noted   Celebrex [celecoxib] Itching and Swelling 09/02/2012   Codeine Nausea And Vomiting and Other (See Comments) 01/19/2023   Cortisone Swelling 09/14/2012   Doxycycline Swelling and Other (See Comments)    Medrol  [methylprednisolone ] Hives and Swelling 10/26/2022   Prednisone  Swelling    Relafen [nabumetone] Swelling 08/04/2011   Aspirin  Other (See Comments) 07/11/2021   Oxycodone -acetaminophen  Itching and Other (See Comments)    Raloxifene Rash 01/19/2023   Rofecoxib Rash 01/19/2023    Family History  Problem Relation Age of Onset   Diabetes Mother    Alzheimer's disease Father    Diabetes Father    Diabetes Other    Lung disease Other    Arthritis Other    Anesthesia problems Neg Hx    Hypotension Neg Hx    Malignant hyperthermia Neg Hx    Pseudochol deficiency Neg Hx    Sleep apnea Neg Hx     Social History   Socioeconomic History   Marital status: Married    Spouse name: Not on file   Number of children: Not on file   Years of education: Not on file   Highest education level: Not on file  Occupational History   Occupation: therapist, music    Employer: DISABLED  Tobacco Use   Smoking status: Every Day    Current packs/day: 1.00    Average packs/day: 1 pack/day for 46.0 years (46.0 ttl pk-yrs)    Types: Cigarettes    Start date:  08/25/1978    Passive exposure: Current   Smokeless tobacco: Never   Tobacco comments:    smokes a pack a day. since age 42  Vaping Use   Vaping status: Never Used  Substance and Sexual Activity   Alcohol use: Never   Drug use: Never   Sexual activity: Yes  Other Topics  Concern   Not on file  Social History Narrative   Lives at home with wife   Right handed   Caffeine: 2 cups of coffee, 2 cans of soda, 4-5 cups of tea daily.   Social Drivers of Corporate Investment Banker Strain: Low Risk  (10/20/2023)   Overall Financial Resource Strain (CARDIA)    Difficulty of Paying Living Expenses: Not hard at all  Food Insecurity: No Food Insecurity (10/20/2023)   Hunger Vital Sign    Worried About Running Out of Food in the Last Year: Never true    Ran Out of Food in the Last Year: Never true  Transportation Needs: No Transportation Needs (10/20/2023)   PRAPARE - Administrator, Civil Service (Medical): No    Lack of Transportation (Non-Medical): No  Physical Activity: Inactive (10/20/2023)   Exercise Vital Sign    Days of Exercise per Week: 0 days    Minutes of Exercise per Session: 0 min  Stress: No Stress Concern Present (10/20/2023)   [employer] of Occupational Health - Occupational Stress Questionnaire    Feeling of Stress : Not at all  Social Connections: Socially Isolated (10/20/2023)   Social Connection and Isolation Panel    Frequency of Communication with Friends and Family: Once a week    Frequency of Social Gatherings with Friends and Family: Once a week    Attends Religious Services: Never    Database Administrator or Organizations: No    Attends Banker Meetings: Never    Marital Status: Married  Catering Manager Violence: Not At Risk (10/20/2023)   Humiliation, Afraid, Rape, and Kick questionnaire    Fear of Current or Ex-Partner: No    Emotionally Abused: No    Physically Abused: No    Sexually Abused: No     Review of Systems   See HPI   Physical Exam   BP (!) 145/80 (BP Location: Left Arm, Patient Position: Sitting, Cuff Size: Large)   Pulse 60   Temp 97.8 F (36.6 C) (Temporal)   Ht 5' 6 (1.676 m)   Wt 224 lb 9.6 oz (101.9 kg)   BMI 36.25 kg/m  General:   Alert and oriented. Pleasant and  cooperative. Well-nourished and well-developed.  Head:  Normocephalic and atraumatic. Eyes:  Without icterus Abdomen:  +BS, distended, obese Rectal:  Deferred  Msk:  Symmetrical without gross deformities. Normal posture. Extremities:  Without edema. Neurologic:  Alert and  oriented x4;  grossly normal neurologically. Skin:  Intact without significant lesions or rashes. Psych:  Alert and cooperative. Normal mood and affect.   Assessment   68 y.o. male presenting today with a history of chronic GERD, IBS mixed, colon polyps, worsening lower abdominal pain, nausea. Last seen in Nov 2025 regarding worsening pain, bloating. Evaluation as above.   Likely discomfort multifactorial in setting of gastritis, possible IBS component trending towards more frequent stool, unable to exclude SIBO, adhesive disease as this is chronic and present since hernia repair with mesh.   Colonoscopy completed early this year and will need 1 year surveillance.       PLAN  Course of Xifaxan  Consider course of amitryptilline, possbily Buspar 3 month return Consider referral to surgery at patient's request: suspect scar tissue contributing. However, any additional laparoscopy would result in increased scar tissue. Would avoid this if possible but will refer if desires to discuss options.    Therisa MICAEL Stager, PhD, ANP-BC [facility]    "

## 2024-08-10 NOTE — Patient Instructions (Addendum)
 Stop carafate   Continue pantoprazole  twice a day.  Course of Xifaxan: I sent this to your pharmacy to take 3 times a day for 2 weeks, and then stop. Let me know if this is too expensive or not covered!  We can consider a different medication after the Xifaxan if you continue to have some concerns.   We will see you in 3 months or sooner if needed! I recommend signing up on MyChart so you can send a message easily to me!  I enjoyed seeing you again today! I value our relationship and want to provide genuine, compassionate, and quality care. You may receive a survey regarding your visit with me, and I welcome your feedback! Thanks so much for taking the time to complete this. I look forward to seeing you again.      Therisa MICAEL Stager, PhD, ANP-BC [facility]

## 2024-08-15 ENCOUNTER — Telehealth: Payer: Self-pay

## 2024-08-15 NOTE — Telephone Encounter (Signed)
 PA done on Cover My Meds for Xifaxan . Pt has not tried / failed anything according to the chart. Dx used: IBS-D. OV faxed as well

## 2024-08-16 ENCOUNTER — Telehealth: Payer: Self-pay | Admitting: Internal Medicine

## 2024-08-16 ENCOUNTER — Telehealth: Payer: Self-pay

## 2024-08-16 MED ORDER — NITROGLYCERIN 0.4 MG SL SUBL: 0.4000 mg | SUBLINGUAL_TABLET | SUBLINGUAL | 3 refills | Status: AC | PRN

## 2024-08-16 NOTE — Telephone Encounter (Signed)
°*  STAT* If patient is at the pharmacy, call can be transferred to refill team.   1. Which medications need to be refilled? (please list name of each medication and dose if known) nitroGLYCERIN  (NITROSTAT ) 0.4 MG SL tablet    2. Would you like to learn more about the convenience, safety, & potential cost savings by using the [facility]?    3. Are you open to using the [facility] (Type [facility]. ).   4. Which pharmacy/location (including street and city if local pharmacy) is medication to be sent to? [facility] - [place], [place] - [id number]    5. Do they need a 30 day or 90 day supply? 90 day

## 2024-08-16 NOTE — Telephone Encounter (Signed)
 Refill sent

## 2024-08-16 NOTE — Telephone Encounter (Signed)
 Pt was denied Xifaxan  550 mg tablet. I have the denial of Medicare Part D. Will put on your desk in a folder.

## 2024-08-23 NOTE — Telephone Encounter (Signed)
 Pt approved for Xifaxan  550mg  tablet. Starting 08/19/2024 until 08/30/2024. Pt advised by phone. Pt advises he will pick up

## 2024-09-05 ENCOUNTER — Telehealth: Payer: Self-pay

## 2024-09-05 ENCOUNTER — Ambulatory Visit

## 2024-09-05 NOTE — Telephone Encounter (Signed)
 Patient wife called stating patient is having a lot of cramping on his left lower abdomin, and does not have a gallbladder. Patient had a CT scan done and was informed that he had kidney stone on his left side and it has not moved patient is unaware if its the kidney stone causing cramping/pain  or the mesh implant from having a hernia repair, patient wife stating urine flow and color has not changed

## 2024-09-06 ENCOUNTER — Telehealth: Payer: Self-pay

## 2024-09-06 NOTE — Telephone Encounter (Signed)
-----   Message from Belvie Clara, MD sent at 09/06/2024  8:31 AM EST ----- Normal CT ----- Message ----- From: Sammie Exie HERO, CMA Sent: 09/05/2024  10:29 AM EST To: Belvie LITTIE Clara, MD  Please review.

## 2024-09-06 NOTE — Telephone Encounter (Signed)
 Called patient to give him CT results per MD McKenzie normal CT patient voiced his understanding

## 2024-09-07 ENCOUNTER — Emergency Department (HOSPITAL_COMMUNITY)
Admission: EM | Admit: 2024-09-07 | Discharge: 2024-09-07 | Disposition: A | Discharge status: Home / Self Care | Attending: Emergency Medicine | Admitting: Emergency Medicine

## 2024-09-07 ENCOUNTER — Other Ambulatory Visit: Payer: Self-pay

## 2024-09-07 ENCOUNTER — Ambulatory Visit
Admission: RE | Admit: 2024-09-07 | Discharge: 2024-09-07 | Disposition: A | Discharge status: Home / Self Care | Source: Ambulatory Visit

## 2024-09-07 ENCOUNTER — Emergency Department (HOSPITAL_COMMUNITY)

## 2024-09-07 ENCOUNTER — Ambulatory Visit (INDEPENDENT_AMBULATORY_CARE_PROVIDER_SITE_OTHER)

## 2024-09-07 ENCOUNTER — Ambulatory Visit: Payer: Self-pay

## 2024-09-07 ENCOUNTER — Ambulatory Visit

## 2024-09-07 ENCOUNTER — Encounter (HOSPITAL_COMMUNITY): Payer: Self-pay

## 2024-09-07 VITALS — BP 161/80 | HR 62 | Temp 98.4°F | Resp 16

## 2024-09-07 DIAGNOSIS — E119 Type 2 diabetes mellitus without complications: Secondary | ICD-10-CM | POA: Diagnosis not present

## 2024-09-07 DIAGNOSIS — Z7982 Long term (current) use of aspirin: Secondary | ICD-10-CM | POA: Insufficient documentation

## 2024-09-07 DIAGNOSIS — X509XXA Other and unspecified overexertion or strenuous movements or postures, initial encounter: Secondary | ICD-10-CM | POA: Insufficient documentation

## 2024-09-07 DIAGNOSIS — S39011A Strain of muscle, fascia and tendon of abdomen, initial encounter: Secondary | ICD-10-CM | POA: Insufficient documentation

## 2024-09-07 DIAGNOSIS — R0789 Other chest pain: Secondary | ICD-10-CM | POA: Insufficient documentation

## 2024-09-07 DIAGNOSIS — R1012 Left upper quadrant pain: Secondary | ICD-10-CM | POA: Diagnosis present

## 2024-09-07 DIAGNOSIS — Z79899 Other long term (current) drug therapy: Secondary | ICD-10-CM | POA: Insufficient documentation

## 2024-09-07 DIAGNOSIS — I10 Essential (primary) hypertension: Secondary | ICD-10-CM | POA: Insufficient documentation

## 2024-09-07 LAB — COMPREHENSIVE METABOLIC PANEL WITH GFR
ALT: 24 U/L (ref 0–44)
AST: 21 U/L (ref 15–41)
Albumin: 4.6 g/dL (ref 3.5–5.0)
Alkaline Phosphatase: 86 U/L (ref 38–126)
Anion gap: 8 (ref 5–15)
BUN: 17 mg/dL (ref 8–23)
CO2: 31 mmol/L (ref 22–32)
Calcium: 9.5 mg/dL (ref 8.9–10.3)
Chloride: 100 mmol/L (ref 98–111)
Creatinine, Ser: 0.98 mg/dL (ref 0.61–1.24)
GFR, Estimated: >60 mL/min
Glucose, Bld: 90 mg/dL (ref 70–99)
Potassium: 4.2 mmol/L (ref 3.5–5.1)
Sodium: 139 mmol/L (ref 135–145)
Total Bilirubin: 0.5 mg/dL (ref 0.0–1.2)
Total Protein: 7 g/dL (ref 6.5–8.1)

## 2024-09-07 LAB — CBC WITH DIFFERENTIAL/PLATELET
Abs Immature Granulocytes: 0.06 K/uL (ref 0.00–0.07)
Basophils Absolute: 0 K/uL (ref 0.0–0.1)
Basophils Relative: 0 %
Eosinophils Absolute: 0 K/uL (ref 0.0–0.5)
Eosinophils Relative: 0 %
HCT: 45.2 % (ref 39.0–52.0)
Hemoglobin: 15.5 g/dL (ref 13.0–17.0)
Immature Granulocytes: 1 %
Lymphocytes Relative: 22 %
Lymphs Abs: 2.2 K/uL (ref 0.7–4.0)
MCH: 28.7 pg (ref 26.0–34.0)
MCHC: 34.3 g/dL (ref 30.0–36.0)
MCV: 83.5 fL (ref 80.0–100.0)
Monocytes Absolute: 0.7 K/uL (ref 0.1–1.0)
Monocytes Relative: 7 %
Neutro Abs: 6.9 K/uL (ref 1.7–7.7)
Neutrophils Relative %: 70 %
Platelets: 249 K/uL (ref 150–400)
RBC: 5.41 MIL/uL (ref 4.22–5.81)
RDW: 14.6 % (ref 11.5–15.5)
WBC: 9.9 K/uL (ref 4.0–10.5)
nRBC: 0 % (ref 0.0–0.2)

## 2024-09-07 LAB — TROPONIN T, HIGH SENSITIVITY
Troponin T High Sensitivity: <15 ng/L (ref 0–19)
Troponin T High Sensitivity: <15 ng/L (ref 0–19)

## 2024-09-07 LAB — MAGNESIUM: Magnesium: 2 mg/dL (ref 1.7–2.4)

## 2024-09-07 LAB — LIPASE, BLOOD: Lipase: 29 U/L (ref 11–51)

## 2024-09-07 MED ORDER — LIDOCAINE 5 % EX PTCH: 1.0000 | MEDICATED_PATCH | CUTANEOUS | 0 refills | Status: AC

## 2024-09-07 MED ORDER — IOHEXOL 350 MG/ML SOLN
75.0000 mL | Freq: Once | INTRAVENOUS | Status: AC | PRN
  Administered 2024-09-07: 100 mL via INTRAVENOUS

## 2024-09-07 MED ORDER — METHOCARBAMOL 500 MG PO TABS: 500.0000 mg | ORAL_TABLET | Freq: Three times a day (TID) | ORAL | 0 refills | Status: AC | PRN

## 2024-09-07 NOTE — ED Notes (Signed)
 Patient is being discharged from the Urgent Care and sent to the Emergency Department via POV . Per Etta Sierras, patient is in need of higher level of care due to abdominal pain. Patient is aware and verbalizes understanding of plan of care.  Vitals:   09/07/24 1426  BP: (!) 161/80  Pulse: 62  Resp: 16  Temp: 98.4 F (36.9 C)  SpO2: 95%

## 2024-09-07 NOTE — Telephone Encounter (Signed)
 Noted ED advised

## 2024-09-07 NOTE — ED Triage Notes (Signed)
 Pt arrived via POV c/o left rib cage pain and new cough that began 2 days ago when Pt reports he was shoveling mulch. Pt arrived following Urgent Care visit today where he had a Xray done, and was told they couldn't find anything wrong. Pt describes pain as sharp stabbing, aching pain.

## 2024-09-07 NOTE — ED Provider Notes (Signed)
 " [facility]    CSN: [id number] Arrival date & time: 09/07/24  1355      History   Chief Complaint Chief Complaint  Patient presents with   Abdominal Pain    Below left breast - Entered by patient    HPI Christopher [name] is a 69 y.o. male.   The history is provided by the patient.   Patient presents for complaints of left-sided chest wall pain and swelling.  Patient states symptoms started yesterday.  States that he was out side shoveling mulch 2 days ago states that he felt something pull.  He states that he has pain near his left rib cage around the left breast.  He states the pain worsens with any movement, coughing, and deep breathing.  He describes the pain as sharp.  He denies nausea, vomiting, difficulty breathing, or shortness of breath.   Past Medical History:  Diagnosis Date   Anxiety    Arthritis    Asthma    BPH (benign prostatic hyperplasia)    Complication of anesthesia    pt had a hard time being able to move after spinal anesthesia , 3-4 hours   COPD (chronic obstructive pulmonary disease) (HCC)    Depression    Diabetes mellitus without complication (HCC)    GERD (gastroesophageal reflux disease)    Gout    no meds   Headache(784.0)    otc meds prn   Heart murmur    dx as a child, no problems as an adult   History of COVID-19 2024   History of hiatal hernia    Hyperlipidemia    IBS (irritable bowel syndrome)    PONV (postoperative nausea and vomiting)    Sleep apnea    uses CIPAP machine at night   Wears partial dentures    bottom partial    Patient Active Problem List   Diagnosis Date Noted   Bloating 08/10/2024   HTN (hypertension) 06/07/2024   Blurred vision 06/07/2024   Obesity, morbid (HCC) 04/05/2024   Pseudoarthrosis of cervical spine (HCC) 09/04/2023   Type 2 diabetes mellitus with hyperglycemia (HCC) 07/08/2023   Abdominal pain 06/12/2023   Prediabetes 01/05/2023   History of anaphylaxis 11/13/2022   Idiopathic  urticaria 11/03/2022   Angioedema 10/24/2022   Spinal stenosis in cervical region 10/13/2022   DOE (dyspnea on exertion) 09/26/2022   Abnormal EKG 09/18/2022   Hypogonadism in male 07/07/2022   Depression, recurrent 06/06/2022   Chronic musculoskeletal pain 06/06/2022   Fatigue 06/06/2022   Nicotine  abuse 06/06/2022   S/P right knee surgery 02/18/2022   S/P TKR (total knee replacement), right 05/14/21 07/02/2021   Status post total right knee replacement 05/14/2021   Primary osteoarthritis of right knee    Renal cyst 10/03/2020   Frequency of micturition 10/03/2020   Benign prostatic hyperplasia with urinary obstruction 10/03/2020   IBS (irritable bowel syndrome) 07/05/2020   Lumbar disc herniation 06/29/2019   Instability of prosthetic shoulder joint    Primary osteoarthritis, left shoulder    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   History of colonic polyps 11/26/2017   S/P total knee replacement, left 06/16/17 06/16/2017   Primary osteoarthritis of left knee    HLD (hyperlipidemia) 09/04/2016   Chest pain 09/04/2016   OSA (obstructive sleep apnea) 09/04/2016   COPD (chronic obstructive pulmonary disease) (HCC) 09/04/2016   Rotator cuff tear 05/05/2014   S/P shoulder surgery 09/26/2013   Arthritis, shoulder region 09/26/2013   Bursitis, shoulder 09/26/2013   Synovitis  of shoulder 09/26/2013   Labral tear of shoulder, degenerative 09/26/2013   Biceps tendon tear 09/26/2013   Rotator cuff syndrome of left shoulder 06/21/2013   Arthritis 06/21/2013   Patellofemoral arthritis of right knee 03/29/2013   Effusion of knee joint 03/29/2013   Bursitis/tendonitis, shoulder 03/10/2013   Effusion of knee joint, left 08/18/2011   Knee pain 08/18/2011   Acute torn meniscus 07/30/2011   Old torn meniscus of knee 07/30/2011   Arthropathy, lower leg 10/15/2010   MEDIAL MENISCUS TEAR, RIGHT 10/15/2010   HIP PAIN 01/15/2010   DEGENERATIVE DISC DISEASE, LUMBOSACRAL SPINE W/RADICULOPATHY  01/15/2010   PLICA SYNDROME 08/09/2009   DERANGEMENT MENISCUS 07/09/2009   JOINT EFFUSION, LEFT KNEE 07/09/2009   Unilateral primary osteoarthritis, left knee 01/30/2009   KNEE PAIN 01/30/2009   Sprain of ankle 11/08/2007    Past Surgical History:  Procedure Laterality Date   ANTERIOR CERVICAL DECOMP/DISCECTOMY FUSION N/A 10/13/2022   Procedure: ANTERIOR CERVICAL DISECTOMY FUSION - CERVICAL THREE-CERVICAL FOUR - CERVICAL FOUR-CERVICAL FIVE REMOVAL OF HARDWARE CERVICAL FIVE-CERVICAL SIX;  Surgeon: [name], MD;  Location: [facility];  Service: Neurosurgery;  Laterality: N/A;   APPENDECTOMY     BACK SURGERY  2002   neck and back fusion   BIOPSY  12/27/2015   Procedure: BIOPSY;  Surgeon: Claudis RAYMOND Rivet, MD;  Location: [facility];  Service: Endoscopy;;  Fundus biopsies and duodenal biopsies   BIOPSY  02/10/2020   Procedure: BIOPSY;  Surgeon: Rivet Claudis RAYMOND, MD;  Location: [facility];  Service: Endoscopy;;  antral   CARDIAC CATHETERIZATION     CARDIAC CATHETERIZATION N/A 09/04/2016   Procedure: Right/Left Heart Cath and Coronary Angiography;  Surgeon: Peter M Jordan, MD;  Location: [facility] INVASIVE CV LAB;  Service: Cardiovascular;  Laterality: N/A;   CHOLECYSTECTOMY     CHONDROPLASTY  08/15/2011   Procedure: CHONDROPLASTY;  Surgeon: Taft Minerva, MD;  Location: [facility];  Service: Orthopedics;  Laterality: Left;   COLONOSCOPY  06/27/2011   Procedure: COLONOSCOPY;  Surgeon: Claudis RAYMOND Rivet, MD;  [facility]: [facility];  Service: Endoscopy;  Laterality: N/A;  9:00 / Pt to be here at 9am for 10:45 procedure, benign polyps removed   COLONOSCOPY N/A 10/05/2014   Procedure: COLONOSCOPY;  Surgeon: Claudis RAYMOND Rivet, MD;  [facility]: [facility];  Service: Endoscopy;  Laterality: N/A;  930   COLONOSCOPY N/A 02/11/2018   Procedure: COLONOSCOPY;  Surgeon: Rivet Claudis RAYMOND, MD;  [facility];  Service: Endoscopy;  Laterality: N/A;  830   COLONOSCOPY N/A 12/02/2023   Procedure:  COLONOSCOPY;  Surgeon: Cindie Carlin POUR, DO;  [facility];  Service: Endoscopy;  Laterality: N/A;  845am, asa 3   ESOPHAGOGASTRODUODENOSCOPY N/A 12/27/2015   Procedure: ESOPHAGOGASTRODUODENOSCOPY (EGD);  Surgeon: Claudis RAYMOND Rivet, MD;  [facility];  Service: Endoscopy;  Laterality: N/A;  3:00   ESOPHAGOGASTRODUODENOSCOPY N/A 07/18/2024   Procedure: EGD (ESOPHAGOGASTRODUODENOSCOPY);  Surgeon: Cindie Carlin POUR, DO;  [facility];  Service: Endoscopy;  Laterality: N/A;  2:00PM;ASA 3   ESOPHAGOGASTRODUODENOSCOPY (EGD) WITH PROPOFOL  N/A 02/10/2020   Procedure: ESOPHAGOGASTRODUODENOSCOPY (EGD) WITH PROPOFOL ;  Surgeon: Rivet Claudis RAYMOND, MD;  [facility];  Service: Endoscopy;  Laterality: N/A;  155   HERNIA REPAIR  1998   umbilical hernia   JOINT REPLACEMENT  2023   knee and shoulder-bilateral knees and left shoulder   KNEE ARTHROSCOPY     left knee   KNEE ARTHROSCOPY     right knee  LUMBAR LAMINECTOMY/DECOMPRESSION MICRODISCECTOMY  09/14/2012   Procedure: LUMBAR LAMINECTOMY/DECOMPRESSION MICRODISCECTOMY 1 LEVEL;  Surgeon: Catalina CHRISTELLA Stains, MD;  Location: [facility];  Service: Neurosurgery;  Laterality: Right;  Right Lumbar three-four Diskectomy   neck fusion  2005   NECK SURGERY     PATELLA-FEMORAL ARTHROPLASTY Right 02/18/2022   Procedure: RIGHT KNEE PATELLA REPLACEMENT, CEMENTED;  Surgeon: Addie Cordella Hamilton, MD;  Location: [facility];  Service: Orthopedics;  Laterality: Right;   POLYPECTOMY  02/11/2018   Procedure: POLYPECTOMY;  Surgeon: Golda Claudis PENNER, MD;  [facility];  Service: Endoscopy;;  colon   POLYPECTOMY  12/02/2023   Procedure: POLYPECTOMY;  Surgeon: Cindie Carlin POUR, DO;  [facility];  Service: Endoscopy;;   POSTERIOR CERVICAL FUSION/FORAMINOTOMY N/A 09/04/2023   Procedure: Posterior Cervical Fusion, Cervical Five-Cervical Four and Cervical Three-Cervical Three-Cervical Four;  Surgeon: [name], MD;  Location:  [facility] OR;  Service: Neurosurgery;  Laterality: N/A;   REVISION TOTAL SHOULDER TO REVERSE TOTAL SHOULDER Left 12/16/2018   Procedure: REVISION TOTAL SHOULDER TO REVERSE TOTAL SHOULDER;  Surgeon: Addie Cordella Hamilton, MD;  Location: [facility] OR;  Service: Orthopedics;  Laterality: Left;   SHOULDER ARTHROSCOPY WITH BICEPSTENOTOMY Left 09/23/2013   Procedure: SHOULDER ARTHROSCOPY WITH BICEPSTENOTOMY AND EXTENSIVE DEBRIDEMENT;  Surgeon: Taft FORBES Minerva, MD;  Location: [facility];  Service: Orthopedics;  Laterality: Left;   SHOULDER ARTHROSCOPY WITH ROTATOR CUFF REPAIR Left 05/05/2014   Procedure: SHOULDER ARTHROSCOPY LIMITED DEBRIDEMENT;  Surgeon: Taft FORBES Minerva, MD;  Location: [facility];  Service: Orthopedics;  Laterality: Left;   SHOULDER OPEN ROTATOR CUFF REPAIR Left 05/05/2014   Procedure: ROTATOR CUFF REPAIR SHOULDER OPEN;  Surgeon: Taft FORBES Minerva, MD;  Location: [facility];  Service: Orthopedics;  Laterality: Left;   SHOULDER OPEN ROTATOR CUFF REPAIR Left 12/16/2018   Procedure: LEFT SHOULDER POSSIBLE SUBSCAPULARIS REPAIR VS. REVISION TO REVERSE TOTAL SHOULDER REPLACEMENT;  Surgeon: Addie Cordella Hamilton, MD;  Location: [facility];  Service: Orthopedics;  Laterality: Left;   SHOULDER SURGERY Right    Open Mumford procedure   SPINAL FUSION     x2, 2003 and 2007   TOTAL KNEE ARTHROPLASTY Left 06/16/2017   Procedure: LEFT TOTAL KNEE ARTHROPLASTY;  Surgeon: Minerva Taft FORBES, MD;  Location: [facility];  Service: Orthopedics;  Laterality: Left;   TOTAL KNEE ARTHROPLASTY Right 05/14/2021   Procedure: TOTAL KNEE ARTHROPLASTY;  Surgeon: Minerva Taft FORBES, MD;  Location: [facility];  Service: Orthopedics;  Laterality: Right;   TOTAL SHOULDER ARTHROPLASTY Left 08/19/2018   Procedure: left shoulder replacement;  Surgeon: Addie Cordella Hamilton, MD;  Location: [facility] OR;  Service: Orthopedics;  Laterality: Left;       Home Medications    Prior to Admission medications  Medication Sig Start Date End Date Taking? Authorizing  Provider  albuterol  (VENTOLIN  HFA) 108 (90 Base) MCG/ACT inhaler inhale 2 puffs into lungs every 6 hours as needed for wheezing or shortness of breath 06/03/23   Melvenia Manus FORBES, MD  amLODipine  (NORVASC ) 5 MG tablet Take 1 tablet (5 mg total) by mouth daily. 06/07/24 06/02/25  Mallipeddi, Vishnu P, MD  aspirin  EC 81 MG tablet Take 1 tablet (81 mg total) by mouth daily. Swallow whole. 06/23/24   Mallipeddi, Vishnu P, MD  Blood Glucose Monitoring Suppl (ONE TOUCH ULTRA 2) w/Device KIT USE TO TEST BLOOD SUGAR THREE TIMES DAILY. 04/21/24   Bevely Doffing, FNP  Blood Glucose Monitoring Suppl DEVI 1 each by Does not apply route in the morning, at noon, and at bedtime.  May substitute to any manufacturer covered by patient's insurance. 07/08/23   Melvenia Manus BRAVO, MD  cyclobenzaprine  (FLEXERIL ) 10 MG tablet Take 1 tablet (10 mg total) by mouth 3 (three) times daily as needed for muscle spasms. 09/05/23   Colon Shove, MD  EPINEPHrine  0.3 mg/0.3 mL IJ SOAJ injection Inject 0.3 mg into the muscle as needed for anaphylaxis. 10/26/23   Tobie Arleta SQUIBB, MD  famotidine  (PEPCID ) 40 MG tablet Take 1 tablet (40 mg total) by mouth 2 (two) times daily as needed (hives). Patient taking differently: Take 40 mg by mouth daily. 04/25/24   Tobie Arleta SQUIBB, MD  fexofenadine  (ALLEGRA  ALLERGY ) 180 MG tablet Take 2 tablets (360 mg total) by mouth 2 (two) times daily as needed (hives). 10/26/23   Tobie Arleta SQUIBB, MD  GLOBAL EASE INJECT PEN NEEDLES 31G X 5 MM MISC Inject 1 Syringe into the skin daily. 12/25/23   Melvenia Manus BRAVO, MD  glucose blood (ONETOUCH ULTRA TEST) test strip Use daily to check blood sugars. 04/26/24   Bevely Doffing, FNP  Lancets [facility] DELICA PLUS [place]) MISC 1 each by Does not apply route in the morning, at noon, and at bedtime. 09/23/23   Melvenia Manus BRAVO, MD  LANTUS  SOLOSTAR 100 UNIT/ML Solostar Pen Inject 20 Units into the skin at bedtime. 08/10/24   Bevely Doffing, FNP  meloxicam  (MOBIC ) 7.5 MG tablet Take 7.5  mg by mouth 2 (two) times daily.    [provider]  Menthol , Topical Analgesic, (FREEZE IT FAST PAIN RELIEF EX) Apply 1 Application topically daily. 10%    [provider]  nitroGLYCERIN  (NITROSTAT ) 0.4 MG SL tablet Place 1 tablet (0.4 mg total) under the tongue every 5 (five) minutes as needed for chest pain. 08/16/24   Alvan Dorn FALCON, MD  omalizumab  (XOLAIR ) 300 MG/2  ML prefilled syringe Inject 300 mg into the skin every 28 (twenty-eight) days. 12/07/23   Cari Arlean HERO, FNP  ondansetron  (ZOFRAN ) 4 MG tablet Take 1 tablet (4 mg total) by mouth every 8 (eight) hours as needed for nausea. 06/12/23   Del Orbe Polanco, Iliana, FNP  pantoprazole  (PROTONIX ) 40 MG tablet Take 1 tablet (40 mg total) by mouth 2 (two) times daily before a meal. TAKE (1) TABLET TWICE A DAY BEFORE MEALS. 07/13/24   Shirlean Therisa ORN, NP  rosuvastatin  (CRESTOR ) 40 MG tablet Take 1 tablet (40 mg total) by mouth daily. 01/01/24   Melvenia Manus BRAVO, MD  sertraline  (ZOLOFT ) 50 MG tablet Take 1 tablet (50 mg total) by mouth in the morning. 05/19/24   Bevely Doffing, FNP  sucralfate  (CARAFATE ) 1 g tablet Take 1 tablet (1 g total) by mouth 4 (four) times daily. 07/18/24 09/16/24  Cindie Carlin POUR, DO  Testosterone  Undecanoate (JATENZO ) 237 MG CAPS Take 1 capsule (237 mg total) by mouth in the morning and at bedtime. 11/25/23   McKenzie, Belvie CROME, MD  Tiotropium Bromide-Olodaterol (STIOLTO RESPIMAT ) 2.5-2.5 MCG/ACT AERS Inhale 2 puffs into the lungs daily. 01/29/23   Shellia Oh, MD    Family History Family History  Problem Relation Age of Onset   Diabetes Mother    Alzheimer's disease Father    Diabetes Father    Diabetes Other    Lung disease Other    Arthritis Other    Anesthesia problems Neg Hx    Hypotension Neg Hx    Malignant hyperthermia Neg Hx    Pseudochol deficiency Neg Hx    Sleep apnea Neg Hx  Social History Social History[1]   Allergies   Celebrex [celecoxib], Codeine, Cortisone,  Doxycycline, Medrol  [methylprednisolone ], Prednisone , Relafen [nabumetone], Aspirin , Oxycodone -acetaminophen , Raloxifene, and Rofecoxib   Review of Systems Review of Systems Per HPI  Physical Exam Triage Vital Signs ED Triage Vitals  Encounter Vitals Group     BP 09/07/24 1426 (!) 161/80     Girls Systolic BP Percentile --      Girls Diastolic BP Percentile --      Boys Systolic BP Percentile --      Boys Diastolic BP Percentile --      Pulse Rate 09/07/24 1426 62     Resp 09/07/24 1426 16     Temp 09/07/24 1426 98.4 F (36.9 C)     Temp Source 09/07/24 1426 Oral     SpO2 09/07/24 1426 95 %     Weight --      Height --      Head Circumference --      Peak Flow --      Pain Score 09/07/24 1425 10     Pain Loc --      Pain Education --      Exclude from Growth Chart --    No data found.  Updated Vital Signs BP (!) 161/80 (BP Location: Right Arm)   Pulse 62   Temp 98.4 F (36.9 C) (Oral)   Resp 16   SpO2 95%   Visual Acuity Right Eye Distance:   Left Eye Distance:   Bilateral Distance:    Right Eye Near:   Left Eye Near:    Bilateral Near:     Physical Exam Vitals and nursing note reviewed.  Constitutional:      General: He is not in acute distress.    Appearance: He is well-developed.  HENT:     Head: Normocephalic.  Eyes:     Extraocular Movements: Extraocular movements intact.     Pupils: Pupils are equal, round, and reactive to light.  Cardiovascular:     Rate and Rhythm: Normal rate and regular rhythm.     Pulses: Normal pulses.     Heart sounds: Normal heart sounds.  Pulmonary:     Effort: Pulmonary effort is normal. No respiratory distress.     Breath sounds: Normal breath sounds. No stridor. No wheezing, rhonchi or rales.  Chest:     Chest wall: Swelling and tenderness present. No deformity.    Abdominal:     General: Bowel sounds are normal.     Palpations: Abdomen is soft.     Tenderness: There is no abdominal tenderness.   Musculoskeletal:     Cervical back: Normal range of motion.  Skin:    General: Skin is warm and dry.  Neurological:     General: No focal deficit present.     Mental Status: He is alert and oriented to person, place, and time.  Psychiatric:        Mood and Affect: Mood normal.        Behavior: Behavior normal.           UC Treatments / Results  Labs (all labs ordered are listed, but only abnormal results are displayed) Labs Reviewed - No data to display  EKG   Radiology DG Chest 2 View Result Date: 09/07/2024 CLINICAL DATA:  Left-sided chest wall pain EXAM: CHEST - 2 VIEW COMPARISON:  02/09/2013, chest CT 11/12/2023 FINDINGS: Negative for pleural effusion. Possible right basilar platelike opacity. Normal cardiac size. No pneumothorax. Left  shoulder replacement. IMPRESSION: Possible platelike opacity at the right base suggestive of subsegmental atelectasis Electronically Signed   By: Luke Bun M.D.   On: 09/07/2024 15:27    Procedures Procedures (including critical care time)  Medications Ordered in UC Medications - No data to display  Initial Impression / Assessment and Plan / UC Course  I have reviewed the triage vital signs and the nursing notes.  Pertinent labs & imaging results that were available during my care of the patient were reviewed by me and considered in my medical decision making (see chart for details).  Patient presents for complaints of left-sided chest wall pain and left upper abdominal pain has been present for the past 2 days after he was shoveling mulch.  Patient states when he was shoveling mulch, he felt a pull in his abdomen.  Since that time, he has pain to the left chest around the breast with swelling to the left chest wall/left upper abdominal wall.  Patient describes the pain as severe.  The left breast is tender to palpation.  Patient also exhibits pain with coughing, deep breathing, and with most movement.  Chest x-ray did not show  any obvious etiology of the patient's symptoms.  Given the swelling noted in his left chest wall and left upper abdomen, and degree of pain,, concern for other etiology which is unable to be determined in this setting.  Consult with supervising physician Dr. Redell Fort, and he is in agreement with the same.  Discussed same with the patient and his spouse.  Patient and spouse are in agreement with this plan of care and verbalized understanding.  All questions were answered.  Patient's vital signs are stable.  He is able to travel to the emergency department via private vehicle.  Patient discharged to the emergency department.   Final Clinical Impressions(s) / UC Diagnoses   Final diagnoses:  Chest wall pain     Discharge Instructions      Patient discharged to the emergency department for further evaluation.     ED Prescriptions   None    PDMP not reviewed this encounter.    [1]  Social History Tobacco Use   Smoking status: Every Day    Current packs/day: 1.00    Average packs/day: 1 pack/day for 46.0 years (46.0 ttl pk-yrs)    Types: Cigarettes    Start date: 08/25/1978    Passive exposure: Current   Smokeless tobacco: Never   Tobacco comments:    smokes a pack a day. since age 77  Vaping Use   Vaping status: Never Used  Substance Use Topics   Alcohol use: Never   Drug use: Never     Gilmer Etta PARAS, NP 09/07/24 1557  "

## 2024-09-07 NOTE — Telephone Encounter (Signed)
 FYI Only or Action Required?: FYI only for provider: ED advised.  Patient was last seen in primary care on 04/05/2024 by Bevely Doffing, FNP.  Called Nurse Triage reporting Chest Pain.  Symptoms began x 2 days ago.  Interventions attempted: Ice/heat application.  Symptoms are: gradually worsening.  Triage Disposition: Go to ED Now (or PCP Triage)  Patient/caregiver understands and will follow disposition?: Yes              Copied from CRM [id number]. Topic: Clinical - Red Word Triage >> Sep 07, 2024  8:55 AM Tiffini S wrote: Red Word that prompted transfer to Nurse Triage: started yesterday- below the patient left breast area he heard a popping sound- possibly pulled a muscle/ iced the area but nothing is helping- pain level is higher than a 10 for extreme pain Reason for Disposition  SEVERE chest or rib pain (e.g., excruciating, unable to do any normal activities)  Answer Assessment - Initial Assessment Questions 1. MECHANISM: How did the injury happen?     Shoveling mulch x2 days ago  2. ONSET: When did the injury happen? (e.g., minutes, hours, days ago)     X 2 days ago   3. LOCATION: Where on the chest is the injury located? Where does it hurt?     Left breast area  4. CHEST OR RIB PAIN SEVERITY: Is there pain? If Yes, ask: How bad is the pain? (e.g., Scale 0-10; none, mild, moderate, severe)     Severe 11/10  5. BREATHING DIFFICULTY: Are you having any difficulty breathing? If Yes, ask: How bad is it?  (e.g., none, mild, moderate, severe)      No   6. OTHER SYMPTOMS (e.g., cough, fever, rash)      No     Patient's wife  called in to triage with complaints of left breast pain in the patient.  This has been ongoing for 2 days, and the pain has worsened yesterday. A popping sound was heard in the chest area.  The wife stated he was shoveling mulch x2 days ago. Deep breathes makes the pain worse, as well as movement.  For home care, the patient  is icing the area.   Based upon pain level of 11/10; patient was referred to the ED. Wife agrees with the plan of care.  Protocols used: Chest Injury - Bending, Lifting, or Twisting-A-AH

## 2024-09-07 NOTE — Discharge Instructions (Signed)
 Your test results today were reassuring.  Your imaging studies did not show any concerning findings.  Treat muscle pain with over-the-counter ibuprofen  and Tylenol .  A prescription for muscle relaxer was sent to your pharmacy.  Take as needed.  Can try heat and ice as well.  Return to the emergency department for any new or worsening symptoms of concern.

## 2024-09-07 NOTE — ED Provider Notes (Signed)
 " [place] EMERGENCY DEPARTMENT AT [facility] Provider Note   CSN: [id number] Arrival date & time: 09/07/24  1603     Patient presents with: Chest Pain   DAT DERKSEN is a 69 y.o. male.    Chest Pain Associated symptoms: abdominal pain   Patient presents for left-sided chest pain.  Also symptoms yesterday.  He reports that he had a sensation of a pulled muscle 2 days ago while shoveling mulch.  Pain has been present since that time.  Pain worsens with movement, coughing, deep inspiration.  He was seen in urgent care where there was a concern of swelling in his upper abdominal wall.  He was sent to the ED for further evaluation.  On arrival, patient describes ongoing pain in left upper abdomen and left lower chest.  He denies any other areas of discomfort.     Prior to Admission medications  Medication Sig Start Date End Date Taking? Authorizing Provider  lidocaine  (LIDODERM ) 5 % Place 1 patch onto the skin daily. Remove & Discard patch within 12 hours or as directed by MD 09/07/24  Yes Melvenia Motto, MD  methocarbamol  (ROBAXIN ) 500 MG tablet Take 1 tablet (500 mg total) by mouth every 8 (eight) hours as needed for muscle spasms. 09/07/24  Yes Melvenia Motto, MD  albuterol  (VENTOLIN  HFA) 108 [phone] Base) MCG/ACT inhaler inhale 2 puffs into lungs every 6 hours as needed for wheezing or shortness of breath 06/03/23   Melvenia Manus BRAVO, MD  amLODipine  (NORVASC ) 5 MG tablet Take 1 tablet (5 mg total) by mouth daily. 06/07/24 06/02/25  Mallipeddi, Vishnu P, MD  aspirin  EC 81 MG tablet Take 1 tablet (81 mg total) by mouth daily. Swallow whole. 06/23/24   Mallipeddi, Vishnu P, MD  Blood Glucose Monitoring Suppl (ONE TOUCH ULTRA 2) w/Device KIT USE TO TEST BLOOD SUGAR THREE TIMES DAILY. 04/21/24   Bevely Doffing, FNP  Blood Glucose Monitoring Suppl DEVI 1 each by Does not apply route in the morning, at noon, and at bedtime. May substitute to any manufacturer covered by patient's insurance. 07/08/23    Melvenia Manus BRAVO, MD  cyclobenzaprine  (FLEXERIL ) 10 MG tablet Take 1 tablet (10 mg total) by mouth 3 (three) times daily as needed for muscle spasms. 09/05/23   Colon Shove, MD  EPINEPHrine  0.3 mg/0.3 mL IJ SOAJ injection Inject 0.3 mg into the muscle as needed for anaphylaxis. 10/26/23   Tobie Arleta SQUIBB, MD  famotidine  (PEPCID ) 40 MG tablet Take 1 tablet (40 mg total) by mouth 2 (two) times daily as needed (hives). Patient taking differently: Take 40 mg by mouth daily. 04/25/24   Tobie Arleta SQUIBB, MD  fexofenadine  (ALLEGRA  ALLERGY ) 180 MG tablet Take 2 tablets (360 mg total) by mouth 2 (two) times daily as needed (hives). 10/26/23   Tobie Arleta SQUIBB, MD  GLOBAL EASE INJECT PEN NEEDLES 31G X 5 MM MISC Inject 1 Syringe into the skin daily. 12/25/23   Melvenia Manus BRAVO, MD  glucose blood (ONETOUCH ULTRA TEST) test strip Use daily to check blood sugars. 04/26/24   Bevely Doffing, FNP  Lancets [facility] DELICA PLUS [place]) MISC 1 each by Does not apply route in the morning, at noon, and at bedtime. 09/23/23   Melvenia Manus BRAVO, MD  LANTUS  SOLOSTAR 100 UNIT/ML Solostar Pen Inject 20 Units into the skin at bedtime. 08/10/24   Bevely Doffing, FNP  meloxicam  (MOBIC ) 7.5 MG tablet Take 7.5 mg by mouth 2 (two) times daily.    [provider]  Menthol , Topical Analgesic, (FREEZE IT FAST PAIN RELIEF EX) Apply 1 Application topically daily. 10%    [provider]  nitroGLYCERIN  (NITROSTAT ) 0.4 MG SL tablet Place 1 tablet (0.4 mg total) under the tongue every 5 (five) minutes as needed for chest pain. 08/16/24   Alvan Dorn FALCON, MD  omalizumab  (XOLAIR ) 300 MG/2  ML prefilled syringe Inject 300 mg into the skin every 28 (twenty-eight) days. 12/07/23   Cari Arlean HERO, FNP  ondansetron  (ZOFRAN ) 4 MG tablet Take 1 tablet (4 mg total) by mouth every 8 (eight) hours as needed for nausea. 06/12/23   Del Orbe Polanco, Iliana, FNP  pantoprazole  (PROTONIX ) 40 MG tablet Take 1 tablet (40 mg total) by mouth 2 (two)  times daily before a meal. TAKE (1) TABLET TWICE A DAY BEFORE MEALS. 07/13/24   Shirlean Therisa ORN, NP  rosuvastatin  (CRESTOR ) 40 MG tablet Take 1 tablet (40 mg total) by mouth daily. 01/01/24   Melvenia Manus BRAVO, MD  sertraline  (ZOLOFT ) 50 MG tablet Take 1 tablet (50 mg total) by mouth in the morning. 05/19/24   Bevely Doffing, FNP  sucralfate  (CARAFATE ) 1 g tablet Take 1 tablet (1 g total) by mouth 4 (four) times daily. 07/18/24 09/16/24  Cindie Carlin POUR, DO  Testosterone  Undecanoate (JATENZO ) 237 MG CAPS Take 1 capsule (237 mg total) by mouth in the morning and at bedtime. 11/25/23   McKenzie, Belvie CROME, MD  Tiotropium Bromide-Olodaterol (STIOLTO RESPIMAT ) 2.5-2.5 MCG/ACT AERS Inhale 2 puffs into the lungs daily. 01/29/23   Sood, Vineet, MD    Allergies: Celebrex [celecoxib], Codeine, Cortisone, Doxycycline, Medrol  [methylprednisolone ], Prednisone , Relafen [nabumetone], Aspirin , Oxycodone -acetaminophen , Raloxifene, and Rofecoxib    Review of Systems  Cardiovascular:  Positive for chest pain.  Gastrointestinal:  Positive for abdominal pain.  All other systems reviewed and are negative.   Updated Vital Signs BP (!) 164/84   Pulse 60   Temp 98 F (36.7 C) (Oral)   Resp 17   Ht 5' 6 (1.676 m)   Wt 101.9 kg   SpO2 97%   BMI 36.26 kg/m   Physical Exam Vitals and nursing note reviewed.  Constitutional:      General: He is not in acute distress.    Appearance: He is well-developed. He is not ill-appearing, toxic-appearing or diaphoretic.  HENT:     Head: Normocephalic and atraumatic.  Eyes:     Conjunctiva/sclera: Conjunctivae normal.  Cardiovascular:     Rate and Rhythm: Normal rate and regular rhythm.  Pulmonary:     Effort: Pulmonary effort is normal. No respiratory distress.  Chest:     Chest wall: Tenderness present.  Abdominal:     Palpations: Abdomen is soft.     Tenderness: There is abdominal tenderness.  Musculoskeletal:        General: No swelling. Normal range of motion.      Cervical back: Normal range of motion and neck supple.  Skin:    General: Skin is warm and dry.     Coloration: Skin is not cyanotic or pale.  Neurological:     General: No focal deficit present.     Mental Status: He is alert and oriented to person, place, and time.  Psychiatric:        Mood and Affect: Mood normal.        Behavior: Behavior normal.     (all labs ordered are listed, but only abnormal results are displayed) Labs Reviewed  CBC WITH DIFFERENTIAL/PLATELET  COMPREHENSIVE METABOLIC  PANEL WITH GFR  MAGNESIUM   LIPASE, BLOOD  TROPONIN T, HIGH SENSITIVITY  TROPONIN T, HIGH SENSITIVITY    EKG: EKG Interpretation Date/Time:  Wednesday September 07 2024 16:14:38 EST Ventricular Rate:  59 PR Interval:  180 QRS Duration:  112 QT Interval:  412 QTC Calculation: 407 R Axis:   88  Text Interpretation: Sinus bradycardia Incomplete right bundle branch block Confirmed by Melvenia Motto [phone]) on 09/07/2024 4:36:29 PM  Radiology: CT Angio Chest PE W and/or Wo Contrast Result Date: 09/07/2024 EXAM: CTA CHEST PE WITHOUT AND WITH CONTRAST CT ABDOMEN AND PELVIS WITHOUT AND WITH CONTRAST 09/07/2024 06:21:46 PM TECHNIQUE: CTA of the chest was performed after the administration of 100 mL (iohexol  (OMNIPAQUE ) 350 MG/ML injection 75 mL IOHEXOL  350 MG/ML SOLN) intravenous contrast. Multiplanar reformatted images are provided for review. MIP images are provided for review. CT of the abdomen and pelvis was performed without and with the administration of intravenous contrast. Automated exposure control, iterative reconstruction, and/or weight based adjustment of the mA/kV was utilized to reduce the radiation dose to as low as reasonably achievable. COMPARISON: Cardiac CT chest 06/15/2024, screening CT chest 11/12/2023, and CT abdomen and pelvis 07/29/2024. CLINICAL HISTORY: Pulmonary embolism (PE) suspected, high prob. Acute nonlocalized abdominal pain, left rib cage pain, and new cough beginning 2  days ago. FINDINGS: CHEST: PULMONARY ARTERIES: Pulmonary arteries are adequately opacified for evaluation. No intraluminal filling defect to suggest pulmonary embolism. Main pulmonary artery is normal in caliber. MEDIASTINUM: Mild cardiac enlargement. No pericardial effusions. The heart demonstrates no other acute abnormality. Calcification in the aorta. No aneurysm or dissection. There is no other acute abnormality of the thoracic aorta. The thyroid  gland is unremarkable. The esophagus is decompressed. There is no significant lymphadenopathy. LUNGS AND PLEURA: Diffuse emphysematous changes throughout the lungs. Bronchial wall thickening with central interstitial changes likely chronic bronchitis. No focal consolidation or pulmonary edema. No pleural effusion or pneumothorax. SOFT TISSUES AND BONES: Postoperative changes in the left shoulder. Degenerative changes in the spine. No acute soft tissue abnormality. ABDOMEN AND PELVIS: LIVER: The liver is unremarkable. GALLBLADDER AND BILE DUCTS: Surgical absence of the gallbladder. No biliary ductal dilatation. SPLEEN: Spleen demonstrates no acute abnormality. PANCREAS: Pancreas demonstrates no acute abnormality. ADRENAL GLANDS: Adrenal glands demonstrate no acute abnormality. KIDNEYS, URETERS AND BLADDER: Multiple bilateral renal cysts. Largest on the left measures 9.7 cm. No change since prior study. No imaging follow-up is indicated. No stones in the kidneys or ureters. No hydronephrosis. No perinephric or periureteral stranding. The bladder is normal. GI AND BOWEL: Small duodenal diverticulum. The stomach, small bowel, and colon are mostly decompressed. No wall thickening or inflammatory stranding is noted. Scattered stool is seen throughout the colon. The appendix is surgically absent. There is no bowel obstruction. REPRODUCTIVE: The prostate gland is not enlarged. Other visualized reproductive organs are unremarkable. PERITONEUM AND RETROPERITONEUM: No ascites or  free air. LYMPH NODES: No lymphadenopathy. VASCULATURE: Calcification of the aorta. No aneurysm. BONES AND SOFT TISSUES: Postoperative fixation of the lower lumbar spine with lower lumbar laminectomies. No focal soft tissue abnormality. IMPRESSION: 1. No evidence of pulmonary embolism. 2. Diffuse emphysematous changes throughout the lungs and bronchial wall thickening with central interstitial changes, likely chronic bronchitis. Pulmonary emphysema is an independent risk factor for lung cancer. Recommend consideration for evaluation for a low-dose CT lung cancer screening program. 3. No acute abdominal or pelvic process. Electronically signed by: Elsie Gravely MD 09/07/2024 06:40 PM EST RP Workstation: [id number]   CT ABDOMEN PELVIS W CONTRAST Result  Date: 09/07/2024 EXAM: CTA CHEST PE WITHOUT AND WITH CONTRAST CT ABDOMEN AND PELVIS WITHOUT AND WITH CONTRAST 09/07/2024 06:21:46 PM TECHNIQUE: CTA of the chest was performed after the administration of 100 mL (iohexol  (OMNIPAQUE ) 350 MG/ML injection 75 mL IOHEXOL  350 MG/ML SOLN) intravenous contrast. Multiplanar reformatted images are provided for review. MIP images are provided for review. CT of the abdomen and pelvis was performed without and with the administration of intravenous contrast. Automated exposure control, iterative reconstruction, and/or weight based adjustment of the mA/kV was utilized to reduce the radiation dose to as low as reasonably achievable. COMPARISON: Cardiac CT chest 06/15/2024, screening CT chest 11/12/2023, and CT abdomen and pelvis 07/29/2024. CLINICAL HISTORY: Pulmonary embolism (PE) suspected, high prob. Acute nonlocalized abdominal pain, left rib cage pain, and new cough beginning 2 days ago. FINDINGS: CHEST: PULMONARY ARTERIES: Pulmonary arteries are adequately opacified for evaluation. No intraluminal filling defect to suggest pulmonary embolism. Main pulmonary artery is normal in caliber. MEDIASTINUM: Mild cardiac enlargement. No  pericardial effusions. The heart demonstrates no other acute abnormality. Calcification in the aorta. No aneurysm or dissection. There is no other acute abnormality of the thoracic aorta. The thyroid  gland is unremarkable. The esophagus is decompressed. There is no significant lymphadenopathy. LUNGS AND PLEURA: Diffuse emphysematous changes throughout the lungs. Bronchial wall thickening with central interstitial changes likely chronic bronchitis. No focal consolidation or pulmonary edema. No pleural effusion or pneumothorax. SOFT TISSUES AND BONES: Postoperative changes in the left shoulder. Degenerative changes in the spine. No acute soft tissue abnormality. ABDOMEN AND PELVIS: LIVER: The liver is unremarkable. GALLBLADDER AND BILE DUCTS: Surgical absence of the gallbladder. No biliary ductal dilatation. SPLEEN: Spleen demonstrates no acute abnormality. PANCREAS: Pancreas demonstrates no acute abnormality. ADRENAL GLANDS: Adrenal glands demonstrate no acute abnormality. KIDNEYS, URETERS AND BLADDER: Multiple bilateral renal cysts. Largest on the left measures 9.7 cm. No change since prior study. No imaging follow-up is indicated. No stones in the kidneys or ureters. No hydronephrosis. No perinephric or periureteral stranding. The bladder is normal. GI AND BOWEL: Small duodenal diverticulum. The stomach, small bowel, and colon are mostly decompressed. No wall thickening or inflammatory stranding is noted. Scattered stool is seen throughout the colon. The appendix is surgically absent. There is no bowel obstruction. REPRODUCTIVE: The prostate gland is not enlarged. Other visualized reproductive organs are unremarkable. PERITONEUM AND RETROPERITONEUM: No ascites or free air. LYMPH NODES: No lymphadenopathy. VASCULATURE: Calcification of the aorta. No aneurysm. BONES AND SOFT TISSUES: Postoperative fixation of the lower lumbar spine with lower lumbar laminectomies. No focal soft tissue abnormality. IMPRESSION: 1. No  evidence of pulmonary embolism. 2. Diffuse emphysematous changes throughout the lungs and bronchial wall thickening with central interstitial changes, likely chronic bronchitis. Pulmonary emphysema is an independent risk factor for lung cancer. Recommend consideration for evaluation for a low-dose CT lung cancer screening program. 3. No acute abdominal or pelvic process. Electronically signed by: Elsie Gravely MD 09/07/2024 06:40 PM EST RP Workstation: [id number]   DG Chest 2 View Result Date: 09/07/2024 CLINICAL DATA:  Left-sided chest wall pain EXAM: CHEST - 2 VIEW COMPARISON:  02/09/2013, chest CT 11/12/2023 FINDINGS: Negative for pleural effusion. Possible right basilar platelike opacity. Normal cardiac size. No pneumothorax. Left shoulder replacement. IMPRESSION: Possible platelike opacity at the right base suggestive of subsegmental atelectasis Electronically Signed   By: Luke Bun M.D.   On: 09/07/2024 15:27     Procedures   Medications Ordered in the ED  iohexol  (OMNIPAQUE ) 350 MG/ML injection 75 mL (100 mLs Intravenous  Contrast Given 09/07/24 1758)                                    Medical Decision Making Amount and/or Complexity of Data Reviewed Labs: ordered. Radiology: ordered.  Risk Prescription drug management.   This patient presents to the ED for concern of left-sided abdominal pain, this involves an extensive number of treatment options, and is a complaint that carries with it a high risk of complications and morbidity.  The differential diagnosis includes muscle strain, muscle injury, hematoma, intra-abdominal infection, pneumonia, neoplasm   Co morbidities / Chronic conditions that complicate the patient evaluation  DM, HTN, OSA, arthritis, IBS, depression   Additional history obtained:  Additional history obtained from EMR External records from outside source obtained and reviewed including N/A   Lab Tests:  I Ordered, and personally interpreted labs.   The pertinent results include: Hemoglobin, no leukocytosis, normal kidney function, normal electrolytes, normal troponin   Imaging Studies ordered:  I ordered imaging studies including CTA chest, CT of abdomen and pelvis I independently visualized and interpreted imaging which showed no acute findings I agree with the radiologist interpretation   Cardiac Monitoring: / EKG:  The patient was maintained on a cardiac monitor.  I personally viewed and interpreted the cardiac monitored which showed an underlying rhythm of: Sinus rhythm   Problem List / ED Course / Critical interventions / Medication management  Patient presenting for pain in left upper abdomen and left lower chest.  This follows a straining injury that he had 2 days ago.  On arrival in the ED, vital signs notable for moderate hypertension.  Patient is well-appearing on exam.  He does have some mild swelling and tenderness to area of pain.  He declines any pain medication at this time.  Workup was initiated.  Patient's lab work was unremarkable.  Imaging studies did not show any acute findings.  Based on absence of findings on imaging, I do suspect abdominal wall muscle strain.  Patient continued to refuse any pain medications in the ED.  He was prescribed medications for home to take as needed.  He was discharged in stable condition.  Social Determinants of Health:  Lives independently     Final diagnoses:  Strain of abdominal wall, initial encounter    ED Discharge Orders          Ordered    methocarbamol  (ROBAXIN ) 500 MG tablet  Every 8 hours PRN        09/07/24 2005    lidocaine  (LIDODERM ) 5 %  Every 24 hours        09/07/24 2005               Melvenia Motto, MD 09/07/24 2005  "

## 2024-09-07 NOTE — ED Notes (Signed)
 Patient transported to CT

## 2024-09-07 NOTE — ED Triage Notes (Addendum)
 Pt reports abdominal pain on the left side near the rib cage has trouble getting up and down because of the pain. Started yesterday.

## 2024-09-07 NOTE — Discharge Instructions (Addendum)
 Patient discharged to the emergency department for further evaluation.

## 2024-09-09 ENCOUNTER — Ambulatory Visit

## 2024-09-13 NOTE — Telephone Encounter (Signed)
 [name] phoned and LM on VM stating the medication Xifaxan  worked for the pt and he is doing much better. She states if you have any questions please call her back

## 2024-09-14 NOTE — Telephone Encounter (Signed)
 Called patient and left vm of MD McKenzie recommendation with the verbal from MD that patient should follow up with his general surgeon or who ever did his hernia repair regarding the mesh. Also advised patient to call back if needed office number left for patient

## 2024-09-20 ENCOUNTER — Ambulatory Visit: Admitting: Internal Medicine

## 2024-09-21 ENCOUNTER — Ambulatory Visit: Admitting: Gastroenterology

## 2024-09-21 ENCOUNTER — Encounter: Payer: Self-pay | Admitting: *Deleted

## 2024-09-23 ENCOUNTER — Ambulatory Visit

## 2024-09-23 DIAGNOSIS — L501 Idiopathic urticaria: Secondary | ICD-10-CM

## 2024-09-30 ENCOUNTER — Ambulatory Visit: Admitting: Internal Medicine

## 2024-10-04 ENCOUNTER — Other Ambulatory Visit: Payer: Self-pay | Admitting: Urology

## 2024-10-06 ENCOUNTER — Telehealth: Payer: Self-pay

## 2024-10-06 ENCOUNTER — Ambulatory Visit: Admitting: Orthopedic Surgery

## 2024-10-06 NOTE — Telephone Encounter (Signed)
[facility]

## 2024-10-06 NOTE — Telephone Encounter (Signed)
 Dr Addie asked if you all could please get patient seen next week for his right thumb

## 2024-10-07 NOTE — Telephone Encounter (Signed)
 LMOM letting the pt know of his appt

## 2024-10-10 ENCOUNTER — Ambulatory Visit: Admitting: Urology

## 2024-10-11 ENCOUNTER — Ambulatory Visit: Admitting: Orthopedic Surgery

## 2024-10-21 ENCOUNTER — Ambulatory Visit

## 2024-10-25 ENCOUNTER — Ambulatory Visit: Payer: PPO

## 2024-11-07 ENCOUNTER — Ambulatory Visit: Admitting: Internal Medicine

## 2024-11-15 ENCOUNTER — Ambulatory Visit (HOSPITAL_COMMUNITY)

## 2024-11-16 ENCOUNTER — Ambulatory Visit

## 2024-11-18 ENCOUNTER — Ambulatory Visit: Admitting: Family Medicine
